# Patient Record
Sex: Female | Born: 1945
Health system: Southern US, Community
[De-identification: ages and names within clinical notes are randomized; demographics above are authoritative.]

## PROBLEM LIST (undated history)

## (undated) DIAGNOSIS — I10 Essential (primary) hypertension: Secondary | ICD-10-CM

## (undated) DIAGNOSIS — K219 Gastro-esophageal reflux disease without esophagitis: Secondary | ICD-10-CM

## (undated) DIAGNOSIS — G709 Myoneural disorder, unspecified: Secondary | ICD-10-CM

## (undated) DIAGNOSIS — R51 Headache: Secondary | ICD-10-CM

## (undated) DIAGNOSIS — F32A Depression, unspecified: Secondary | ICD-10-CM

## (undated) DIAGNOSIS — M810 Age-related osteoporosis without current pathological fracture: Secondary | ICD-10-CM

## (undated) DIAGNOSIS — J189 Pneumonia, unspecified organism: Secondary | ICD-10-CM

## (undated) DIAGNOSIS — F419 Anxiety disorder, unspecified: Secondary | ICD-10-CM

## (undated) DIAGNOSIS — R519 Headache, unspecified: Secondary | ICD-10-CM

## (undated) DIAGNOSIS — G20 Parkinson's disease: Secondary | ICD-10-CM

## (undated) DIAGNOSIS — C4491 Basal cell carcinoma of skin, unspecified: Secondary | ICD-10-CM

## (undated) DIAGNOSIS — E7800 Pure hypercholesterolemia, unspecified: Secondary | ICD-10-CM

## (undated) DIAGNOSIS — R251 Tremor, unspecified: Secondary | ICD-10-CM

## (undated) DIAGNOSIS — M1990 Unspecified osteoarthritis, unspecified site: Secondary | ICD-10-CM

## (undated) DIAGNOSIS — E785 Hyperlipidemia, unspecified: Secondary | ICD-10-CM

## (undated) DIAGNOSIS — G20A1 Parkinson's disease without dyskinesia, without mention of fluctuations: Secondary | ICD-10-CM

## (undated) DIAGNOSIS — M8580 Other specified disorders of bone density and structure, unspecified site: Secondary | ICD-10-CM

## (undated) DIAGNOSIS — K5190 Ulcerative colitis, unspecified, without complications: Secondary | ICD-10-CM

## (undated) HISTORY — DX: Headache: R51

## (undated) HISTORY — DX: Gastro-esophageal reflux disease without esophagitis: K21.9

## (undated) HISTORY — DX: Basal cell carcinoma of skin, unspecified: C44.91

## (undated) HISTORY — DX: Headache, unspecified: R51.9

## (undated) HISTORY — DX: Hyperlipidemia, unspecified: E78.5

## (undated) HISTORY — DX: Parkinson's disease: G20

## (undated) HISTORY — PX: CARPAL TUNNEL RELEASE: SHX101

## (undated) HISTORY — PX: TUBAL LIGATION: SHX77

## (undated) HISTORY — DX: Ulcerative colitis, unspecified, without complications: K51.90

## (undated) HISTORY — DX: Unspecified osteoarthritis, unspecified site: M19.90

## (undated) HISTORY — DX: Age-related osteoporosis without current pathological fracture: M81.0

## (undated) HISTORY — DX: Other specified disorders of bone density and structure, unspecified site: M85.80

## (undated) HISTORY — DX: Parkinson's disease without dyskinesia, without mention of fluctuations: G20.A1

---

## 2002-07-05 ENCOUNTER — Ambulatory Visit (HOSPITAL_COMMUNITY): Admission: RE | Admit: 2002-07-05 | Discharge: 2002-07-05 | Payer: Self-pay | Admitting: Orthopedic Surgery

## 2002-07-05 ENCOUNTER — Encounter: Payer: Self-pay | Admitting: Orthopedic Surgery

## 2002-09-01 ENCOUNTER — Encounter: Payer: Self-pay | Admitting: Family Medicine

## 2002-09-01 ENCOUNTER — Encounter: Admission: RE | Admit: 2002-09-01 | Discharge: 2002-09-01 | Payer: Self-pay | Admitting: Family Medicine

## 2003-08-18 ENCOUNTER — Other Ambulatory Visit: Admission: RE | Admit: 2003-08-18 | Discharge: 2003-08-18 | Payer: Self-pay | Admitting: Family Medicine

## 2003-09-08 ENCOUNTER — Ambulatory Visit (HOSPITAL_BASED_OUTPATIENT_CLINIC_OR_DEPARTMENT_OTHER): Admission: RE | Admit: 2003-09-08 | Discharge: 2003-09-08 | Payer: Self-pay | Admitting: Orthopedic Surgery

## 2003-09-08 ENCOUNTER — Ambulatory Visit (HOSPITAL_COMMUNITY): Admission: RE | Admit: 2003-09-08 | Discharge: 2003-09-08 | Payer: Self-pay | Admitting: Orthopedic Surgery

## 2003-09-08 ENCOUNTER — Encounter (INDEPENDENT_AMBULATORY_CARE_PROVIDER_SITE_OTHER): Payer: Self-pay | Admitting: *Deleted

## 2004-01-19 ENCOUNTER — Encounter: Admission: RE | Admit: 2004-01-19 | Discharge: 2004-01-19 | Payer: Self-pay | Admitting: Family Medicine

## 2005-05-02 ENCOUNTER — Other Ambulatory Visit: Admission: RE | Admit: 2005-05-02 | Discharge: 2005-05-02 | Payer: Self-pay | Admitting: Family Medicine

## 2005-06-20 ENCOUNTER — Encounter: Admission: RE | Admit: 2005-06-20 | Discharge: 2005-06-20 | Payer: Self-pay | Admitting: Family Medicine

## 2007-05-21 ENCOUNTER — Ambulatory Visit (HOSPITAL_COMMUNITY): Admission: RE | Admit: 2007-05-21 | Discharge: 2007-05-21 | Payer: Self-pay | Admitting: Interventional Cardiology

## 2008-04-30 ENCOUNTER — Encounter: Admission: RE | Admit: 2008-04-30 | Discharge: 2008-04-30 | Payer: Self-pay | Admitting: Orthopedic Surgery

## 2009-01-05 ENCOUNTER — Other Ambulatory Visit: Admission: RE | Admit: 2009-01-05 | Discharge: 2009-01-05 | Payer: Self-pay | Admitting: Family Medicine

## 2010-05-18 IMAGING — MR MR [PERSON_NAME] LOW JT W/O CM*R*
4 of 5 series · 14 of 40 positions shown · non-contrast
Comparison: None available

CLINICAL DATA: Severe right knee pain and swelling.

MRI RIGHT KNEE WITHOUT CONTRAST
TECHNIQUE: Multiplanar, multisequence MR imaging of the right knee
was performed.  No intravenous contrast was administered.

[Series 3: PD fat-sat · axial · 3.5mm · 0.28mm/px · z∈[-59,+17]mm · 5 of 24 slices shown (1 of 2)]
[im 1/24]
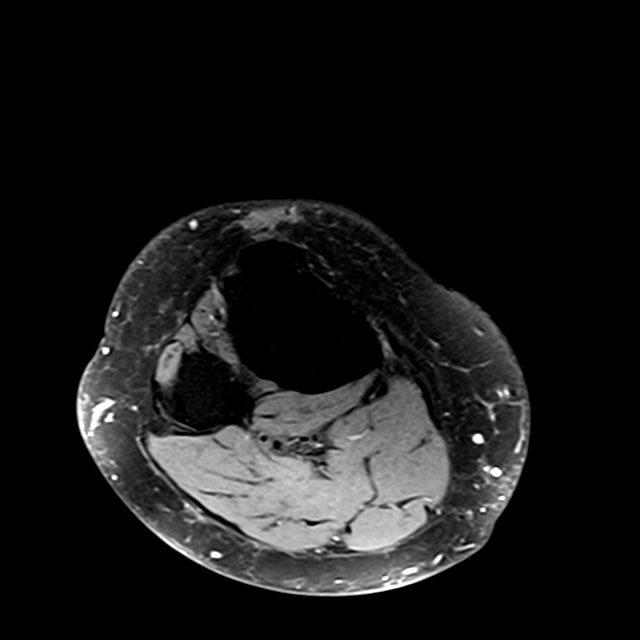
[im 3/24]
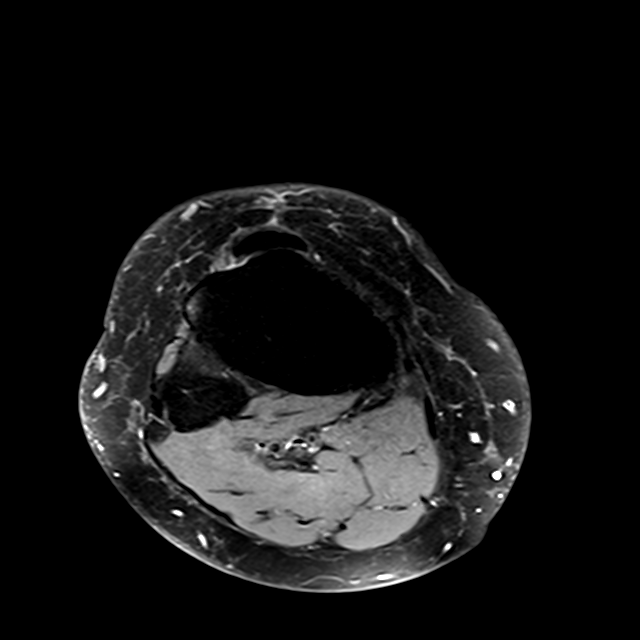
[im 6/24]
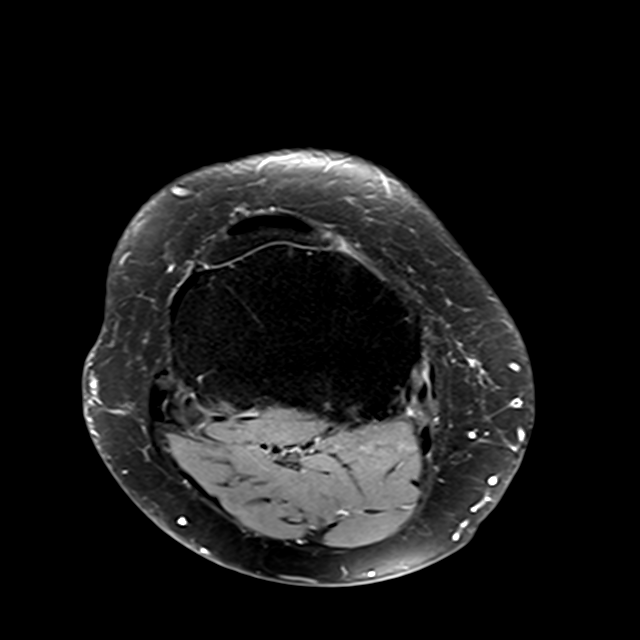
[im 12/24]
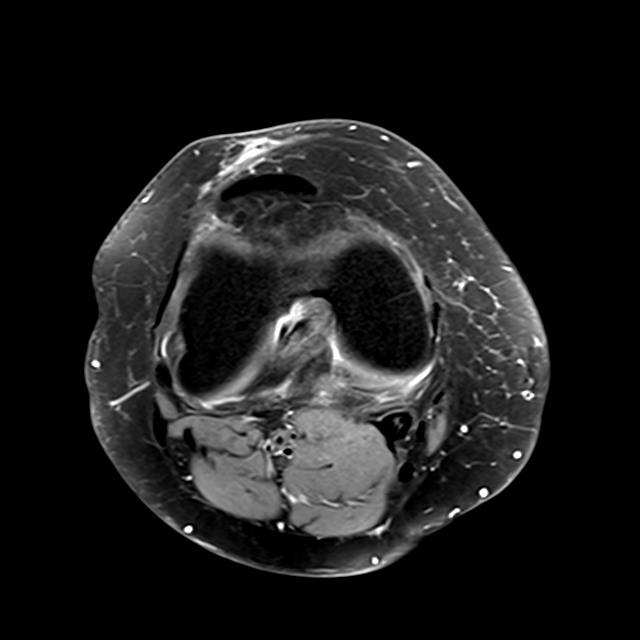
[im 21/24]
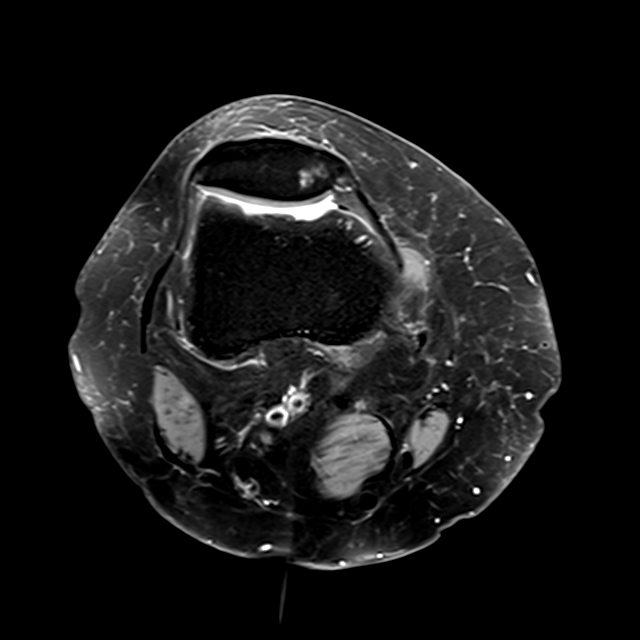

[Series 4: PD fat-sat · coronal · 3.3mm · 0.28mm/px · 3 of 24 slices shown (2 of 2)]
[im 4/24]
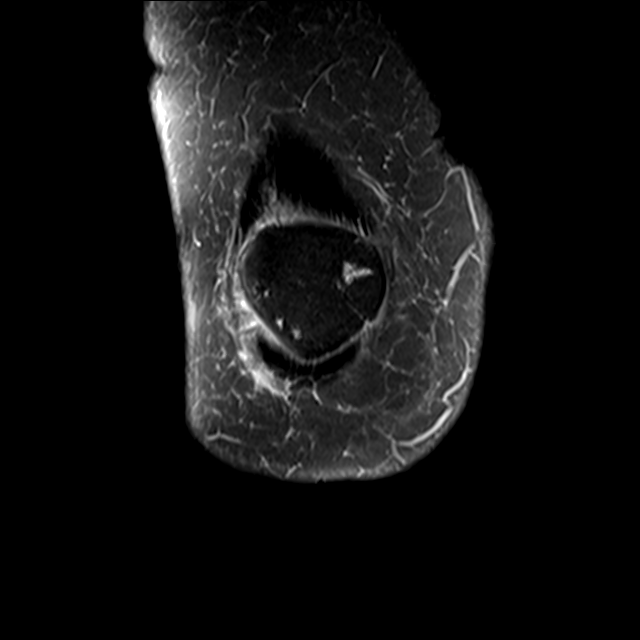
[im 14/24]
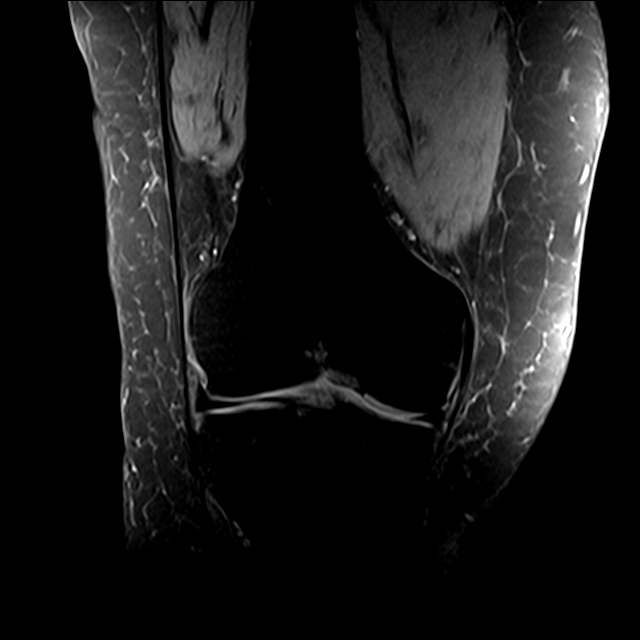
[im 20/24]
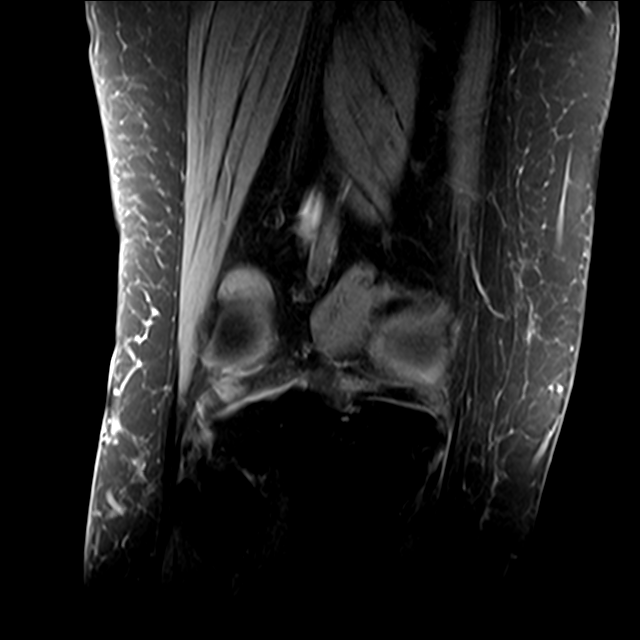

[Series 5: T2 fat-sat · coronal · 3.3mm · 0.35mm/px · 3 of 24 slices shown]
[im 4/24]
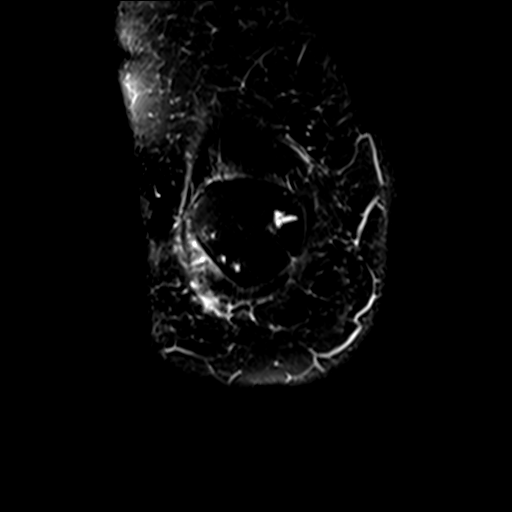
[im 14/24]
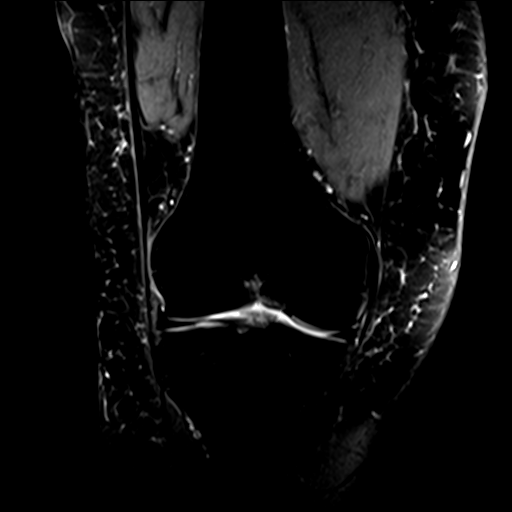
[im 20/24]
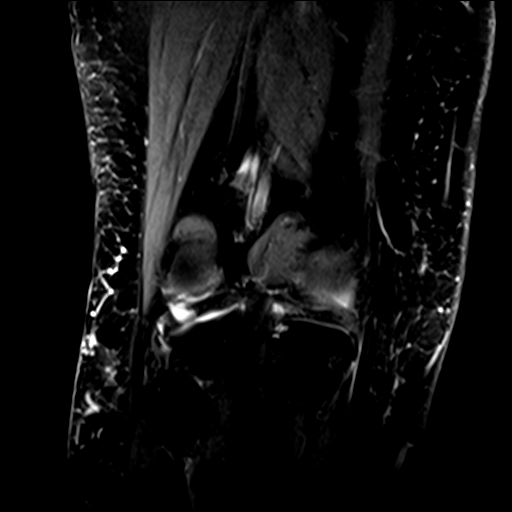

[Series 6: T1 · coronal · 3.3mm · 0.28mm/px · 3 of 24 slices shown]
[im 4/24]
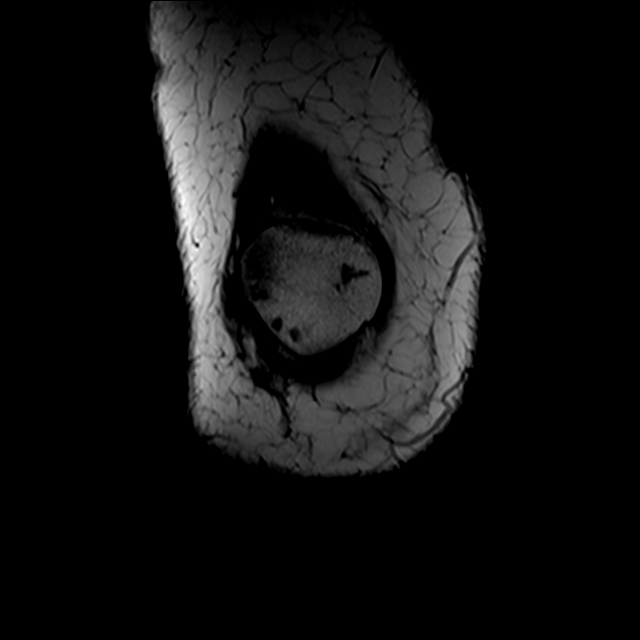
[im 14/24]
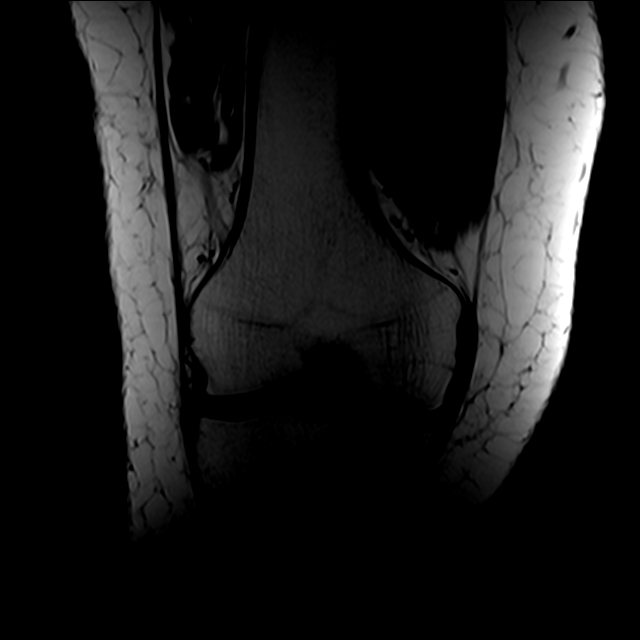
[im 20/24]
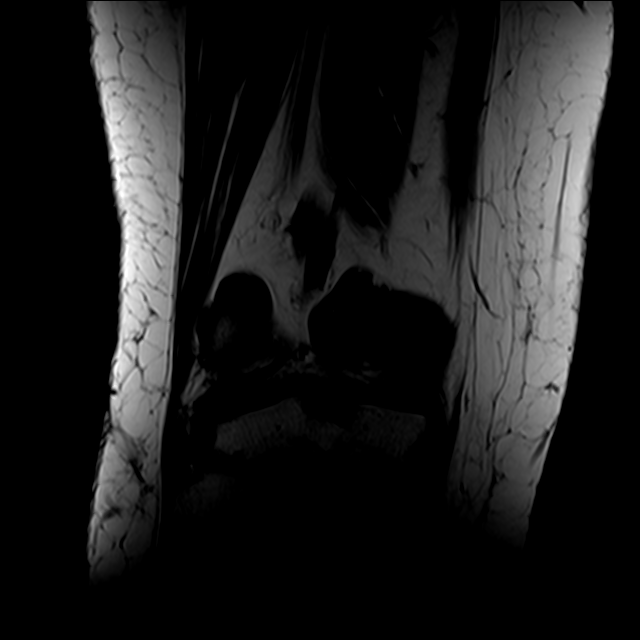

[14 of 40 positions shown; findings below may reference images not displayed]

FINDINGS: There is a longitudinal tear of the body of the medial
meniscus.  The tear is in the periphery of the meniscus and no
displaced fragment is identified.  The lateral meniscus is intact.
The anterior and posterior cruciate ligaments, medial and lateral
collateral ligament complexes and extensor mechanism of the knee
are all intact.

There is tricompartmental degenerative change about the knee which
appears worst of patellofemoral compartment where there is
scattered subchondral cyst formation, worst along the medial
patellar facet.  Scant amount of joint fluid is noted.  There is no
Baker's cyst.
IMPRESSION: 1.  Longitudinal tear of the body the medial meniscus without a
displaced fragment.
2.  Degenerative change of the knee worst in the patellofemoral
compartment.

## 2010-09-02 ENCOUNTER — Encounter: Payer: Self-pay | Admitting: Orthopedic Surgery

## 2010-12-27 NOTE — Op Note (Signed)
NAME:  Ashley Neal, HARTNEY NO.:  [id number]   MEDICAL RECORD NO.:  [id number]                   PATIENT TYPE:  AMB   LOCATION:  [place]                                  FACILITY:  [facility]   PHYSICIAN:  Daryll Brod, M.D.                    DATE OF BIRTH:  [birth year]   DATE OF PROCEDURE:  09/08/2003  DATE OF DISCHARGE:                                 OPERATIVE REPORT   PREOPERATIVE DIAGNOSIS:  Pyogenic granuloma, right thumb.   POSTOPERATIVE DIAGNOSIS:  Pyogenic granuloma, right thumb.   OPERATION:  Excision pyogenic granuloma, right thumb.   SURGEON:  Daryll Brod, M.D.   ASSISTANTDimas Millin   ANESTHESIA:  Metacarpal block.   HISTORY:  The patient is a 65 year old female with a history of a small cut  on the margin of her right thumb nail fold that has developed pyogenic  granuloma which has been removed once, has been cauterized once, only to  recur.   DESCRIPTION OF PROCEDURE:  The patient was brought to the operating room  where a metacarpal block with 1% Xylocaine without epinephrine was used.  She is prepped using DuraPrep in the supine position with the right arm  free.  After adequate anesthesia, a Penrose drain was used for tourniquet  control at the base of the thumb.  The pyogenic granuloma was then removed  elliptically.  The bed was cauterized.  The wound was then closed with  interrupted 5-0 chromic sutures, several being placed through the nail to  allow complete closure of the skin.  A compressive dressing was applied.  The patient tolerated the procedure well.  On removal of the Penrose drain,  the thumb immediately pinked.  She is discharged home to return to the [facility] in one week on Vicodin and Keflex.                                               Daryll Brod, M.D.    Aretta Nip  D:  09/08/2003  T:  09/08/2003  Job:  [id number]

## 2011-11-02 DIAGNOSIS — M79609 Pain in unspecified limb: Secondary | ICD-10-CM | POA: Diagnosis not present

## 2011-11-11 DIAGNOSIS — Z23 Encounter for immunization: Secondary | ICD-10-CM | POA: Diagnosis not present

## 2011-11-11 DIAGNOSIS — M810 Age-related osteoporosis without current pathological fracture: Secondary | ICD-10-CM | POA: Diagnosis not present

## 2011-11-11 DIAGNOSIS — E785 Hyperlipidemia, unspecified: Secondary | ICD-10-CM | POA: Diagnosis not present

## 2011-11-11 DIAGNOSIS — Z79899 Other long term (current) drug therapy: Secondary | ICD-10-CM | POA: Diagnosis not present

## 2011-11-11 DIAGNOSIS — I10 Essential (primary) hypertension: Secondary | ICD-10-CM | POA: Diagnosis not present

## 2011-12-12 ENCOUNTER — Other Ambulatory Visit: Payer: Self-pay | Admitting: Dermatology

## 2011-12-12 DIAGNOSIS — L578 Other skin changes due to chronic exposure to nonionizing radiation: Secondary | ICD-10-CM | POA: Diagnosis not present

## 2011-12-12 DIAGNOSIS — L821 Other seborrheic keratosis: Secondary | ICD-10-CM | POA: Diagnosis not present

## 2011-12-12 DIAGNOSIS — D485 Neoplasm of uncertain behavior of skin: Secondary | ICD-10-CM | POA: Diagnosis not present

## 2012-10-01 ENCOUNTER — Other Ambulatory Visit (HOSPITAL_COMMUNITY)
Admission: RE | Admit: 2012-10-01 | Discharge: 2012-10-01 | Disposition: A | Discharge status: Home / Self Care | Payer: Medicare Other | Source: Ambulatory Visit | Attending: Family Medicine | Admitting: Family Medicine

## 2012-10-01 ENCOUNTER — Other Ambulatory Visit: Payer: Self-pay | Admitting: Family Medicine

## 2012-10-01 DIAGNOSIS — Z124 Encounter for screening for malignant neoplasm of cervix: Secondary | ICD-10-CM | POA: Diagnosis not present

## 2012-10-01 DIAGNOSIS — Z1331 Encounter for screening for depression: Secondary | ICD-10-CM | POA: Diagnosis not present

## 2012-10-22 ENCOUNTER — Other Ambulatory Visit: Payer: Self-pay

## 2012-11-18 ENCOUNTER — Ambulatory Visit
Admission: RE | Admit: 2012-11-18 | Discharge: 2012-11-18 | Disposition: A | Discharge status: Home / Self Care | Payer: Medicare Other | Source: Ambulatory Visit

## 2012-11-18 DIAGNOSIS — Z1231 Encounter for screening mammogram for malignant neoplasm of breast: Secondary | ICD-10-CM

## 2012-12-10 ENCOUNTER — Other Ambulatory Visit: Payer: Self-pay | Admitting: Dermatology

## 2012-12-10 DIAGNOSIS — L820 Inflamed seborrheic keratosis: Secondary | ICD-10-CM | POA: Diagnosis not present

## 2012-12-10 DIAGNOSIS — L821 Other seborrheic keratosis: Secondary | ICD-10-CM | POA: Diagnosis not present

## 2012-12-10 DIAGNOSIS — L570 Actinic keratosis: Secondary | ICD-10-CM | POA: Diagnosis not present

## 2012-12-10 DIAGNOSIS — L578 Other skin changes due to chronic exposure to nonionizing radiation: Secondary | ICD-10-CM | POA: Diagnosis not present

## 2012-12-10 DIAGNOSIS — L680 Hirsutism: Secondary | ICD-10-CM | POA: Diagnosis not present

## 2012-12-10 DIAGNOSIS — L819 Disorder of pigmentation, unspecified: Secondary | ICD-10-CM | POA: Diagnosis not present

## 2012-12-10 DIAGNOSIS — D485 Neoplasm of uncertain behavior of skin: Secondary | ICD-10-CM | POA: Diagnosis not present

## 2013-01-07 DIAGNOSIS — Z1211 Encounter for screening for malignant neoplasm of colon: Secondary | ICD-10-CM | POA: Diagnosis not present

## 2013-05-03 DIAGNOSIS — L02419 Cutaneous abscess of limb, unspecified: Secondary | ICD-10-CM | POA: Diagnosis not present

## 2013-05-05 DIAGNOSIS — I10 Essential (primary) hypertension: Secondary | ICD-10-CM | POA: Diagnosis not present

## 2013-05-05 DIAGNOSIS — Z79899 Other long term (current) drug therapy: Secondary | ICD-10-CM | POA: Diagnosis not present

## 2013-05-05 DIAGNOSIS — M810 Age-related osteoporosis without current pathological fracture: Secondary | ICD-10-CM | POA: Diagnosis not present

## 2013-05-05 DIAGNOSIS — E785 Hyperlipidemia, unspecified: Secondary | ICD-10-CM | POA: Diagnosis not present

## 2013-10-17 ENCOUNTER — Other Ambulatory Visit: Payer: Self-pay

## 2013-10-17 DIAGNOSIS — Z1231 Encounter for screening mammogram for malignant neoplasm of breast: Secondary | ICD-10-CM

## 2013-10-21 DIAGNOSIS — Z1331 Encounter for screening for depression: Secondary | ICD-10-CM | POA: Diagnosis not present

## 2013-10-21 DIAGNOSIS — E785 Hyperlipidemia, unspecified: Secondary | ICD-10-CM | POA: Diagnosis not present

## 2013-10-21 DIAGNOSIS — K219 Gastro-esophageal reflux disease without esophagitis: Secondary | ICD-10-CM | POA: Diagnosis not present

## 2013-10-21 DIAGNOSIS — M810 Age-related osteoporosis without current pathological fracture: Secondary | ICD-10-CM | POA: Diagnosis not present

## 2013-10-21 DIAGNOSIS — I10 Essential (primary) hypertension: Secondary | ICD-10-CM | POA: Diagnosis not present

## 2013-10-21 DIAGNOSIS — Z23 Encounter for immunization: Secondary | ICD-10-CM | POA: Diagnosis not present

## 2013-10-21 DIAGNOSIS — Z79899 Other long term (current) drug therapy: Secondary | ICD-10-CM | POA: Diagnosis not present

## 2013-10-21 DIAGNOSIS — M25519 Pain in unspecified shoulder: Secondary | ICD-10-CM | POA: Diagnosis not present

## 2013-10-21 DIAGNOSIS — Z0000 Encounter for general adult medical examination without abnormal findings: Secondary | ICD-10-CM | POA: Diagnosis not present

## 2013-10-28 DIAGNOSIS — M7500 Adhesive capsulitis of unspecified shoulder: Secondary | ICD-10-CM | POA: Diagnosis not present

## 2013-11-03 DIAGNOSIS — M25519 Pain in unspecified shoulder: Secondary | ICD-10-CM | POA: Diagnosis not present

## 2013-11-03 DIAGNOSIS — M7500 Adhesive capsulitis of unspecified shoulder: Secondary | ICD-10-CM | POA: Diagnosis not present

## 2013-11-07 DIAGNOSIS — M7500 Adhesive capsulitis of unspecified shoulder: Secondary | ICD-10-CM | POA: Diagnosis not present

## 2013-11-07 DIAGNOSIS — M25519 Pain in unspecified shoulder: Secondary | ICD-10-CM | POA: Diagnosis not present

## 2013-11-17 DIAGNOSIS — M25519 Pain in unspecified shoulder: Secondary | ICD-10-CM | POA: Diagnosis not present

## 2013-11-17 DIAGNOSIS — M7500 Adhesive capsulitis of unspecified shoulder: Secondary | ICD-10-CM | POA: Diagnosis not present

## 2013-11-24 DIAGNOSIS — M7500 Adhesive capsulitis of unspecified shoulder: Secondary | ICD-10-CM | POA: Diagnosis not present

## 2013-11-24 DIAGNOSIS — M25519 Pain in unspecified shoulder: Secondary | ICD-10-CM | POA: Diagnosis not present

## 2013-12-01 DIAGNOSIS — M7500 Adhesive capsulitis of unspecified shoulder: Secondary | ICD-10-CM | POA: Diagnosis not present

## 2013-12-01 DIAGNOSIS — M25519 Pain in unspecified shoulder: Secondary | ICD-10-CM | POA: Diagnosis not present

## 2013-12-02 ENCOUNTER — Ambulatory Visit
Admission: RE | Admit: 2013-12-02 | Discharge: 2013-12-02 | Disposition: A | Discharge status: Home / Self Care | Payer: Medicare Other | Source: Ambulatory Visit

## 2013-12-02 DIAGNOSIS — Z1231 Encounter for screening mammogram for malignant neoplasm of breast: Secondary | ICD-10-CM

## 2013-12-16 DIAGNOSIS — L821 Other seborrheic keratosis: Secondary | ICD-10-CM | POA: Diagnosis not present

## 2013-12-16 DIAGNOSIS — L739 Follicular disorder, unspecified: Secondary | ICD-10-CM | POA: Diagnosis not present

## 2013-12-16 DIAGNOSIS — L723 Sebaceous cyst: Secondary | ICD-10-CM | POA: Diagnosis not present

## 2013-12-16 DIAGNOSIS — L819 Disorder of pigmentation, unspecified: Secondary | ICD-10-CM | POA: Diagnosis not present

## 2013-12-16 DIAGNOSIS — D1801 Hemangioma of skin and subcutaneous tissue: Secondary | ICD-10-CM | POA: Diagnosis not present

## 2013-12-16 DIAGNOSIS — M7500 Adhesive capsulitis of unspecified shoulder: Secondary | ICD-10-CM | POA: Diagnosis not present

## 2013-12-16 DIAGNOSIS — M25519 Pain in unspecified shoulder: Secondary | ICD-10-CM | POA: Diagnosis not present

## 2013-12-16 DIAGNOSIS — L905 Scar conditions and fibrosis of skin: Secondary | ICD-10-CM | POA: Diagnosis not present

## 2013-12-16 DIAGNOSIS — D239 Other benign neoplasm of skin, unspecified: Secondary | ICD-10-CM | POA: Diagnosis not present

## 2014-05-05 DIAGNOSIS — Z79899 Other long term (current) drug therapy: Secondary | ICD-10-CM | POA: Diagnosis not present

## 2014-05-05 DIAGNOSIS — E7800 Pure hypercholesterolemia, unspecified: Secondary | ICD-10-CM | POA: Diagnosis not present

## 2014-05-05 DIAGNOSIS — M949 Disorder of cartilage, unspecified: Secondary | ICD-10-CM | POA: Diagnosis not present

## 2014-05-05 DIAGNOSIS — Z23 Encounter for immunization: Secondary | ICD-10-CM | POA: Diagnosis not present

## 2014-05-05 DIAGNOSIS — I10 Essential (primary) hypertension: Secondary | ICD-10-CM | POA: Diagnosis not present

## 2014-05-05 DIAGNOSIS — M899 Disorder of bone, unspecified: Secondary | ICD-10-CM | POA: Diagnosis not present

## 2014-09-08 DIAGNOSIS — L821 Other seborrheic keratosis: Secondary | ICD-10-CM | POA: Diagnosis not present

## 2014-09-08 DIAGNOSIS — L814 Other melanin hyperpigmentation: Secondary | ICD-10-CM | POA: Diagnosis not present

## 2014-09-08 DIAGNOSIS — D225 Melanocytic nevi of trunk: Secondary | ICD-10-CM | POA: Diagnosis not present

## 2014-09-08 DIAGNOSIS — L720 Epidermal cyst: Secondary | ICD-10-CM | POA: Diagnosis not present

## 2014-09-08 DIAGNOSIS — D2262 Melanocytic nevi of left upper limb, including shoulder: Secondary | ICD-10-CM | POA: Diagnosis not present

## 2014-09-18 ENCOUNTER — Encounter (HOSPITAL_COMMUNITY): Payer: Self-pay | Admitting: Family Medicine

## 2014-09-18 ENCOUNTER — Emergency Department (HOSPITAL_COMMUNITY): Payer: Medicare Other

## 2014-09-18 ENCOUNTER — Emergency Department (HOSPITAL_COMMUNITY)
Admission: EM | Admit: 2014-09-18 | Discharge: 2014-09-18 | Discharge status: Against Medical Advice | Payer: Medicare Other | Attending: Emergency Medicine | Admitting: Emergency Medicine

## 2014-09-18 ENCOUNTER — Telehealth: Payer: Self-pay | Admitting: Physician Assistant

## 2014-09-18 DIAGNOSIS — R109 Unspecified abdominal pain: Secondary | ICD-10-CM | POA: Diagnosis not present

## 2014-09-18 DIAGNOSIS — R079 Chest pain, unspecified: Secondary | ICD-10-CM | POA: Diagnosis not present

## 2014-09-18 DIAGNOSIS — R0789 Other chest pain: Secondary | ICD-10-CM | POA: Diagnosis not present

## 2014-09-18 DIAGNOSIS — R5383 Other fatigue: Secondary | ICD-10-CM | POA: Insufficient documentation

## 2014-09-18 DIAGNOSIS — I10 Essential (primary) hypertension: Secondary | ICD-10-CM | POA: Diagnosis not present

## 2014-09-18 DIAGNOSIS — M549 Dorsalgia, unspecified: Secondary | ICD-10-CM | POA: Diagnosis not present

## 2014-09-18 DIAGNOSIS — Z8639 Personal history of other endocrine, nutritional and metabolic disease: Secondary | ICD-10-CM | POA: Insufficient documentation

## 2014-09-18 HISTORY — DX: Essential (primary) hypertension: I10

## 2014-09-18 HISTORY — DX: Pure hypercholesterolemia, unspecified: E78.00

## 2014-09-18 LAB — BASIC METABOLIC PANEL
ANION GAP: 8 (ref 5–15)
BUN: 9 mg/dL (ref 6–23)
CHLORIDE: 106 mmol/L (ref 96–112)
CO2: 28 mmol/L (ref 19–32)
Calcium: 9.9 mg/dL (ref 8.4–10.5)
Creatinine, Ser: 0.87 mg/dL (ref 0.50–1.10)
GFR calc Af Amer: 78 mL/min — ABNORMAL LOW (ref >90)
GFR calc non Af Amer: 67 mL/min — ABNORMAL LOW (ref >90)
Glucose, Bld: 98 mg/dL (ref 70–99)
Potassium: 3.7 mmol/L (ref 3.5–5.1)
Sodium: 142 mmol/L (ref 135–145)

## 2014-09-18 LAB — CBC
HEMATOCRIT: 40.9 % (ref 36.0–46.0)
Hemoglobin: 14.1 g/dL (ref 12.0–15.0)
MCH: 30.6 pg (ref 26.0–34.0)
MCHC: 34.5 g/dL (ref 30.0–36.0)
MCV: 88.7 fL (ref 78.0–100.0)
Platelets: 227 10*3/uL (ref 150–400)
RBC: 4.61 MIL/uL (ref 3.87–5.11)
RDW: 13.2 % (ref 11.5–15.5)
WBC: 5.3 10*3/uL (ref 4.0–10.5)

## 2014-09-18 LAB — I-STAT TROPONIN, ED: Troponin i, poc: 0 ng/mL (ref 0.00–0.08)

## 2014-09-18 MED ORDER — ASPIRIN 81 MG PO CHEW
324.0000 mg | CHEWABLE_TABLET | Freq: Once | ORAL | Status: DC
  Filled 2014-09-18: qty 4

## 2014-09-18 NOTE — ED Provider Notes (Signed)
CSN: [id number]     Arrival date & time 09/18/14  1102 History   First MD Initiated Contact with Patient 09/18/14 1549     Chief Complaint  Patient presents with  . Back Pain  . Chest Pain     (Consider location/radiation/quality/duration/timing/severity/associated sxs/prior Treatment) HPI Comments: Patient presents to ED with onset of upper back "tightness of radiated to her chest, left arm around 8:30 this morning. Symptoms were dull and achy but became sharp and stabbing lasting for a few minutes at a time. Never had this pain before. Pain was worse with palpation and worse with movement of her arm. Worse with position change. She denies any shortness of breath, nausea or vomiting. She says she became sweaty and anxious because she thought she was having a heart attack. Denies any cardiac history. She reportedly clean catheterization about 5 years ago by Dr. Irish Lack she reports history of high blood pressure, high cholesterol. She does not smoke. She is feeling back to normal now.  Patient is a 68 y.o. female presenting with chest pain. The history is provided by the patient.  Chest Pain Associated symptoms: abdominal pain, back pain and fatigue   Associated symptoms: no cough, no dizziness, no fever, no nausea, no shortness of breath, not vomiting and no weakness     Past Medical History  Diagnosis Date  . High cholesterol   . Hypertension    History reviewed. No pertinent past surgical history. History reviewed. No pertinent family history. History  Substance Use Topics  . Smoking status: Never Smoker   . Smokeless tobacco: Not on file  . Alcohol Use: No   OB History    No data available     Review of Systems  Constitutional: Positive for fatigue. Negative for fever, activity change and appetite change.  HENT: Negative for congestion and rhinorrhea.   Respiratory: Positive for chest tightness. Negative for cough and shortness of breath.   Cardiovascular: Positive for  chest pain.  Gastrointestinal: Positive for abdominal pain. Negative for nausea and vomiting.  Genitourinary: Negative for dysuria, hematuria, vaginal bleeding and vaginal discharge.  Musculoskeletal: Positive for back pain.  Skin: Negative for rash.  Neurological: Negative for dizziness, weakness and light-headedness.  A complete 10 system review of systems was obtained and all systems are negative except as noted in the HPI and PMH.      Allergies  Codeine and Tape  Home Medications   Prior to Admission medications   Not on File   BP 137/76 mmHg  Pulse 73  Temp(Src) 98 F (36.7 C)  Resp 18  Ht 5\' 1"  (1.549 m)  SpO2 95% Physical Exam  Constitutional: She is oriented to person, place, and time. She appears well-developed and well-nourished. No distress.  HENT:  Head: Normocephalic and atraumatic.  Mouth/Throat: Oropharynx is clear and moist. No oropharyngeal exudate.  Eyes: Conjunctivae and EOM are normal. Pupils are equal, round, and reactive to light.  Neck: Normal range of motion. Neck supple.  No meningismus.  Cardiovascular: Normal rate, regular rhythm, normal heart sounds and intact distal pulses.   No murmur heard. Pulmonary/Chest: Effort normal and breath sounds normal. No respiratory distress. She exhibits no tenderness.  Abdominal: Soft. There is no tenderness. There is no rebound and no guarding.  Musculoskeletal: Normal range of motion. She exhibits no edema or tenderness.  Neurological: She is alert and oriented to person, place, and time. No cranial nerve deficit. She exhibits normal muscle tone. Coordination normal.  No ataxia on  finger to nose bilaterally. No pronator drift. 5/5 strength throughout. CN 2-12 intact. Negative Romberg. Equal grip strength. Sensation intact. Gait is normal.   Skin: Skin is warm.  Psychiatric: She has a normal mood and affect. Her behavior is normal.  Nursing note and vitals reviewed.   ED Course  Procedures (including  critical care time) Labs Review Labs Reviewed  BASIC METABOLIC PANEL - Abnormal; Notable for the following:    GFR calc non Af Amer 67 (*)    GFR calc Af Amer 78 (*)    All other components within normal limits  CBC  I-STAT TROPOININ, ED    Imaging Review Dg Chest 2 View  09/18/2014   CLINICAL DATA:  Upper back pain radiating to the chest. Acute chest pain.  EXAM: CHEST  2 VIEW  COMPARISON:  None.  FINDINGS: The heart size and mediastinal contours are within normal limits. Both lungs are clear. The visualized skeletal structures are unremarkable. Minor thoracic degenerative changes.  IMPRESSION: No active cardiopulmonary disease.   Electronically Signed   By: Daryll Brod M.D.   On: 09/18/2014 12:57     EKG Interpretation   Date/Time:  Monday September 18 2014 11:29:59 EST Ventricular Rate:  78 PR Interval:  146 QRS Duration: 76 QT Interval:  372 QTC Calculation: 424 R Axis:   48 Text Interpretation:  Normal sinus rhythm with sinus arrhythmia  Nonspecific ST and T wave abnormality Abnormal ECG No previous ECGs  available Confirmed by Wyvonnia Dusky  MD, Marni Franzoni [phone]) on 09/18/2014 3:49:30 PM      MDM   Final diagnoses:  Chest pain   Episode of back pain and chest pain that is dull and achy that became sharp and stabbing over a few minutes. It is worse with movement and position change.  ST depression inferior and laterally, no comparison.  Triage troponin negative.  Second troponin and d-dimer ordered. Plan to discuss with cardiology given patient's abnormal EKG. Patient eloped from the ED before I could speak with her. Nursing staff was unable to speak with her as well. Patient left the ED without informing staff.  Ezequiel Essex, MD 09/19/14 [phone]

## 2014-09-18 NOTE — Progress Notes (Signed)
Came to see patient in consultation but room was empty. I was informed by nursing staff that patient left AMA. Bakari Nikolai PA-C

## 2014-09-18 NOTE — ED Notes (Signed)
Per pt she was at work this am and had sudden onset of upper back pain radiating into her chest and now radiation to left arm. sts hurts to breathe deep. denies SOB,.

## 2014-09-18 NOTE — Telephone Encounter (Signed)
Since patient left ER AMA without being seen, I have left a message on our office's scheduling voicemail requesting a new patient appointment ASAP, and our office will call the patient with this information. Donovyn Guidice PA-C

## 2014-09-18 NOTE — ED Notes (Signed)
Pt stated she was tired of waiting and was leaving.  RN asked pt to let her get her primary care giver to talk with her.  RN went into room and patient had left AMA.

## 2014-09-22 ENCOUNTER — Encounter: Payer: Medicare Other | Admitting: Cardiology

## 2014-09-22 DIAGNOSIS — R9431 Abnormal electrocardiogram [ECG] [EKG]: Secondary | ICD-10-CM | POA: Diagnosis not present

## 2014-09-22 DIAGNOSIS — M549 Dorsalgia, unspecified: Secondary | ICD-10-CM | POA: Diagnosis not present

## 2014-10-27 DIAGNOSIS — Z0000 Encounter for general adult medical examination without abnormal findings: Secondary | ICD-10-CM | POA: Diagnosis not present

## 2014-10-27 DIAGNOSIS — K219 Gastro-esophageal reflux disease without esophagitis: Secondary | ICD-10-CM | POA: Diagnosis not present

## 2014-10-27 DIAGNOSIS — Z1389 Encounter for screening for other disorder: Secondary | ICD-10-CM | POA: Diagnosis not present

## 2014-10-27 DIAGNOSIS — E780 Pure hypercholesterolemia: Secondary | ICD-10-CM | POA: Diagnosis not present

## 2014-10-27 DIAGNOSIS — M810 Age-related osteoporosis without current pathological fracture: Secondary | ICD-10-CM | POA: Diagnosis not present

## 2014-10-27 DIAGNOSIS — I10 Essential (primary) hypertension: Secondary | ICD-10-CM | POA: Diagnosis not present

## 2014-10-27 DIAGNOSIS — K056 Periodontal disease, unspecified: Secondary | ICD-10-CM | POA: Diagnosis not present

## 2014-10-27 DIAGNOSIS — Z79899 Other long term (current) drug therapy: Secondary | ICD-10-CM | POA: Diagnosis not present

## 2014-10-27 DIAGNOSIS — F419 Anxiety disorder, unspecified: Secondary | ICD-10-CM | POA: Diagnosis not present

## 2014-11-06 DIAGNOSIS — F411 Generalized anxiety disorder: Secondary | ICD-10-CM | POA: Diagnosis not present

## 2014-11-17 ENCOUNTER — Other Ambulatory Visit: Payer: Self-pay

## 2014-11-17 DIAGNOSIS — Z1231 Encounter for screening mammogram for malignant neoplasm of breast: Secondary | ICD-10-CM

## 2014-12-15 DIAGNOSIS — F411 Generalized anxiety disorder: Secondary | ICD-10-CM | POA: Diagnosis not present

## 2014-12-29 ENCOUNTER — Ambulatory Visit
Admission: RE | Admit: 2014-12-29 | Discharge: 2014-12-29 | Disposition: A | Discharge status: Home / Self Care | Payer: Medicare Other | Source: Ambulatory Visit

## 2014-12-29 DIAGNOSIS — Z1231 Encounter for screening mammogram for malignant neoplasm of breast: Secondary | ICD-10-CM

## 2015-01-26 DIAGNOSIS — F411 Generalized anxiety disorder: Secondary | ICD-10-CM | POA: Diagnosis not present

## 2015-03-23 DIAGNOSIS — L820 Inflamed seborrheic keratosis: Secondary | ICD-10-CM | POA: Diagnosis not present

## 2015-03-23 DIAGNOSIS — L720 Epidermal cyst: Secondary | ICD-10-CM | POA: Diagnosis not present

## 2015-03-23 DIAGNOSIS — D485 Neoplasm of uncertain behavior of skin: Secondary | ICD-10-CM | POA: Diagnosis not present

## 2015-03-23 DIAGNOSIS — L821 Other seborrheic keratosis: Secondary | ICD-10-CM | POA: Diagnosis not present

## 2015-04-27 DIAGNOSIS — Z23 Encounter for immunization: Secondary | ICD-10-CM | POA: Diagnosis not present

## 2015-05-04 DIAGNOSIS — I10 Essential (primary) hypertension: Secondary | ICD-10-CM | POA: Diagnosis not present

## 2015-05-04 DIAGNOSIS — E780 Pure hypercholesterolemia: Secondary | ICD-10-CM | POA: Diagnosis not present

## 2015-05-04 DIAGNOSIS — Z79899 Other long term (current) drug therapy: Secondary | ICD-10-CM | POA: Diagnosis not present

## 2015-08-12 HISTORY — PX: SQUAMOUS CELL CARCINOMA EXCISION: SHX2433

## 2015-09-21 DIAGNOSIS — L821 Other seborrheic keratosis: Secondary | ICD-10-CM | POA: Diagnosis not present

## 2015-09-21 DIAGNOSIS — L720 Epidermal cyst: Secondary | ICD-10-CM | POA: Diagnosis not present

## 2015-09-21 DIAGNOSIS — D225 Melanocytic nevi of trunk: Secondary | ICD-10-CM | POA: Diagnosis not present

## 2015-09-21 DIAGNOSIS — L603 Nail dystrophy: Secondary | ICD-10-CM | POA: Diagnosis not present

## 2015-09-21 DIAGNOSIS — D2271 Melanocytic nevi of right lower limb, including hip: Secondary | ICD-10-CM | POA: Diagnosis not present

## 2015-09-21 DIAGNOSIS — L814 Other melanin hyperpigmentation: Secondary | ICD-10-CM | POA: Diagnosis not present

## 2015-11-02 DIAGNOSIS — K219 Gastro-esophageal reflux disease without esophagitis: Secondary | ICD-10-CM | POA: Diagnosis not present

## 2015-11-02 DIAGNOSIS — M810 Age-related osteoporosis without current pathological fracture: Secondary | ICD-10-CM | POA: Diagnosis not present

## 2015-11-02 DIAGNOSIS — I10 Essential (primary) hypertension: Secondary | ICD-10-CM | POA: Diagnosis not present

## 2015-11-02 DIAGNOSIS — L659 Nonscarring hair loss, unspecified: Secondary | ICD-10-CM | POA: Diagnosis not present

## 2015-11-02 DIAGNOSIS — Z1389 Encounter for screening for other disorder: Secondary | ICD-10-CM | POA: Diagnosis not present

## 2015-11-02 DIAGNOSIS — E7800 Pure hypercholesterolemia, unspecified: Secondary | ICD-10-CM | POA: Diagnosis not present

## 2015-11-02 DIAGNOSIS — Z0000 Encounter for general adult medical examination without abnormal findings: Secondary | ICD-10-CM | POA: Diagnosis not present

## 2015-11-02 DIAGNOSIS — F419 Anxiety disorder, unspecified: Secondary | ICD-10-CM | POA: Diagnosis not present

## 2015-11-02 DIAGNOSIS — Z79899 Other long term (current) drug therapy: Secondary | ICD-10-CM | POA: Diagnosis not present

## 2015-12-17 ENCOUNTER — Other Ambulatory Visit: Payer: Self-pay

## 2015-12-17 DIAGNOSIS — Z1231 Encounter for screening mammogram for malignant neoplasm of breast: Secondary | ICD-10-CM

## 2016-01-25 ENCOUNTER — Ambulatory Visit
Admission: RE | Admit: 2016-01-25 | Discharge: 2016-01-25 | Disposition: A | Discharge status: Home / Self Care | Payer: Medicare Other | Source: Ambulatory Visit

## 2016-01-25 DIAGNOSIS — Z1231 Encounter for screening mammogram for malignant neoplasm of breast: Secondary | ICD-10-CM

## 2016-03-14 DIAGNOSIS — M5489 Other dorsalgia: Secondary | ICD-10-CM | POA: Diagnosis not present

## 2016-04-10 DIAGNOSIS — M546 Pain in thoracic spine: Secondary | ICD-10-CM | POA: Diagnosis not present

## 2016-04-10 DIAGNOSIS — Z23 Encounter for immunization: Secondary | ICD-10-CM | POA: Diagnosis not present

## 2016-04-18 DIAGNOSIS — M62830 Muscle spasm of back: Secondary | ICD-10-CM | POA: Diagnosis not present

## 2016-04-18 DIAGNOSIS — M546 Pain in thoracic spine: Secondary | ICD-10-CM | POA: Diagnosis not present

## 2016-04-18 DIAGNOSIS — I10 Essential (primary) hypertension: Secondary | ICD-10-CM | POA: Diagnosis not present

## 2016-04-18 DIAGNOSIS — M2560 Stiffness of unspecified joint, not elsewhere classified: Secondary | ICD-10-CM | POA: Diagnosis not present

## 2016-04-21 DIAGNOSIS — M546 Pain in thoracic spine: Secondary | ICD-10-CM | POA: Diagnosis not present

## 2016-04-21 DIAGNOSIS — M2560 Stiffness of unspecified joint, not elsewhere classified: Secondary | ICD-10-CM | POA: Diagnosis not present

## 2016-04-21 DIAGNOSIS — M62830 Muscle spasm of back: Secondary | ICD-10-CM | POA: Diagnosis not present

## 2016-04-21 DIAGNOSIS — I10 Essential (primary) hypertension: Secondary | ICD-10-CM | POA: Diagnosis not present

## 2016-04-29 DIAGNOSIS — M546 Pain in thoracic spine: Secondary | ICD-10-CM | POA: Diagnosis not present

## 2016-04-29 DIAGNOSIS — I10 Essential (primary) hypertension: Secondary | ICD-10-CM | POA: Diagnosis not present

## 2016-04-29 DIAGNOSIS — M2560 Stiffness of unspecified joint, not elsewhere classified: Secondary | ICD-10-CM | POA: Diagnosis not present

## 2016-04-29 DIAGNOSIS — M62830 Muscle spasm of back: Secondary | ICD-10-CM | POA: Diagnosis not present

## 2016-05-02 DIAGNOSIS — M62830 Muscle spasm of back: Secondary | ICD-10-CM | POA: Diagnosis not present

## 2016-05-02 DIAGNOSIS — M546 Pain in thoracic spine: Secondary | ICD-10-CM | POA: Diagnosis not present

## 2016-05-02 DIAGNOSIS — I10 Essential (primary) hypertension: Secondary | ICD-10-CM | POA: Diagnosis not present

## 2016-05-02 DIAGNOSIS — M2560 Stiffness of unspecified joint, not elsewhere classified: Secondary | ICD-10-CM | POA: Diagnosis not present

## 2016-05-06 DIAGNOSIS — M62830 Muscle spasm of back: Secondary | ICD-10-CM | POA: Diagnosis not present

## 2016-05-06 DIAGNOSIS — I10 Essential (primary) hypertension: Secondary | ICD-10-CM | POA: Diagnosis not present

## 2016-05-06 DIAGNOSIS — M2560 Stiffness of unspecified joint, not elsewhere classified: Secondary | ICD-10-CM | POA: Diagnosis not present

## 2016-05-06 DIAGNOSIS — M546 Pain in thoracic spine: Secondary | ICD-10-CM | POA: Diagnosis not present

## 2016-05-09 DIAGNOSIS — M2560 Stiffness of unspecified joint, not elsewhere classified: Secondary | ICD-10-CM | POA: Diagnosis not present

## 2016-05-09 DIAGNOSIS — Z79899 Other long term (current) drug therapy: Secondary | ICD-10-CM | POA: Diagnosis not present

## 2016-05-09 DIAGNOSIS — F419 Anxiety disorder, unspecified: Secondary | ICD-10-CM | POA: Diagnosis not present

## 2016-05-09 DIAGNOSIS — F339 Major depressive disorder, recurrent, unspecified: Secondary | ICD-10-CM | POA: Diagnosis not present

## 2016-05-09 DIAGNOSIS — M549 Dorsalgia, unspecified: Secondary | ICD-10-CM | POA: Diagnosis not present

## 2016-05-09 DIAGNOSIS — E7800 Pure hypercholesterolemia, unspecified: Secondary | ICD-10-CM | POA: Diagnosis not present

## 2016-05-09 DIAGNOSIS — M810 Age-related osteoporosis without current pathological fracture: Secondary | ICD-10-CM | POA: Diagnosis not present

## 2016-05-09 DIAGNOSIS — M546 Pain in thoracic spine: Secondary | ICD-10-CM | POA: Diagnosis not present

## 2016-05-09 DIAGNOSIS — I10 Essential (primary) hypertension: Secondary | ICD-10-CM | POA: Diagnosis not present

## 2016-05-09 DIAGNOSIS — M62830 Muscle spasm of back: Secondary | ICD-10-CM | POA: Diagnosis not present

## 2016-05-13 DIAGNOSIS — M2560 Stiffness of unspecified joint, not elsewhere classified: Secondary | ICD-10-CM | POA: Diagnosis not present

## 2016-05-13 DIAGNOSIS — I10 Essential (primary) hypertension: Secondary | ICD-10-CM | POA: Diagnosis not present

## 2016-05-13 DIAGNOSIS — M62830 Muscle spasm of back: Secondary | ICD-10-CM | POA: Diagnosis not present

## 2016-05-13 DIAGNOSIS — M546 Pain in thoracic spine: Secondary | ICD-10-CM | POA: Diagnosis not present

## 2016-05-16 DIAGNOSIS — M62830 Muscle spasm of back: Secondary | ICD-10-CM | POA: Diagnosis not present

## 2016-05-16 DIAGNOSIS — M546 Pain in thoracic spine: Secondary | ICD-10-CM | POA: Diagnosis not present

## 2016-05-16 DIAGNOSIS — M2560 Stiffness of unspecified joint, not elsewhere classified: Secondary | ICD-10-CM | POA: Diagnosis not present

## 2016-05-16 DIAGNOSIS — I10 Essential (primary) hypertension: Secondary | ICD-10-CM | POA: Diagnosis not present

## 2016-05-20 DIAGNOSIS — M62830 Muscle spasm of back: Secondary | ICD-10-CM | POA: Diagnosis not present

## 2016-05-20 DIAGNOSIS — I10 Essential (primary) hypertension: Secondary | ICD-10-CM | POA: Diagnosis not present

## 2016-05-20 DIAGNOSIS — M546 Pain in thoracic spine: Secondary | ICD-10-CM | POA: Diagnosis not present

## 2016-05-20 DIAGNOSIS — M2560 Stiffness of unspecified joint, not elsewhere classified: Secondary | ICD-10-CM | POA: Diagnosis not present

## 2016-05-22 DIAGNOSIS — I10 Essential (primary) hypertension: Secondary | ICD-10-CM | POA: Diagnosis not present

## 2016-05-22 DIAGNOSIS — M2560 Stiffness of unspecified joint, not elsewhere classified: Secondary | ICD-10-CM | POA: Diagnosis not present

## 2016-05-22 DIAGNOSIS — M546 Pain in thoracic spine: Secondary | ICD-10-CM | POA: Diagnosis not present

## 2016-05-22 DIAGNOSIS — M62830 Muscle spasm of back: Secondary | ICD-10-CM | POA: Diagnosis not present

## 2016-05-26 DIAGNOSIS — I10 Essential (primary) hypertension: Secondary | ICD-10-CM | POA: Diagnosis not present

## 2016-05-26 DIAGNOSIS — M62830 Muscle spasm of back: Secondary | ICD-10-CM | POA: Diagnosis not present

## 2016-05-26 DIAGNOSIS — M546 Pain in thoracic spine: Secondary | ICD-10-CM | POA: Diagnosis not present

## 2016-05-26 DIAGNOSIS — M2560 Stiffness of unspecified joint, not elsewhere classified: Secondary | ICD-10-CM | POA: Diagnosis not present

## 2016-05-30 DIAGNOSIS — I10 Essential (primary) hypertension: Secondary | ICD-10-CM | POA: Diagnosis not present

## 2016-05-30 DIAGNOSIS — M546 Pain in thoracic spine: Secondary | ICD-10-CM | POA: Diagnosis not present

## 2016-05-30 DIAGNOSIS — M2560 Stiffness of unspecified joint, not elsewhere classified: Secondary | ICD-10-CM | POA: Diagnosis not present

## 2016-05-30 DIAGNOSIS — M62830 Muscle spasm of back: Secondary | ICD-10-CM | POA: Diagnosis not present

## 2016-07-11 DIAGNOSIS — H6980 Other specified disorders of Eustachian tube, unspecified ear: Secondary | ICD-10-CM | POA: Diagnosis not present

## 2016-10-05 IMAGING — DX DG CHEST 2V
2 series · 2 of 2 positions shown · non-contrast
Comparison: None.

CLINICAL DATA: Upper back pain radiating to the chest. Acute chest
pain.

EXAM:
CHEST  2 VIEW

[chest pa]
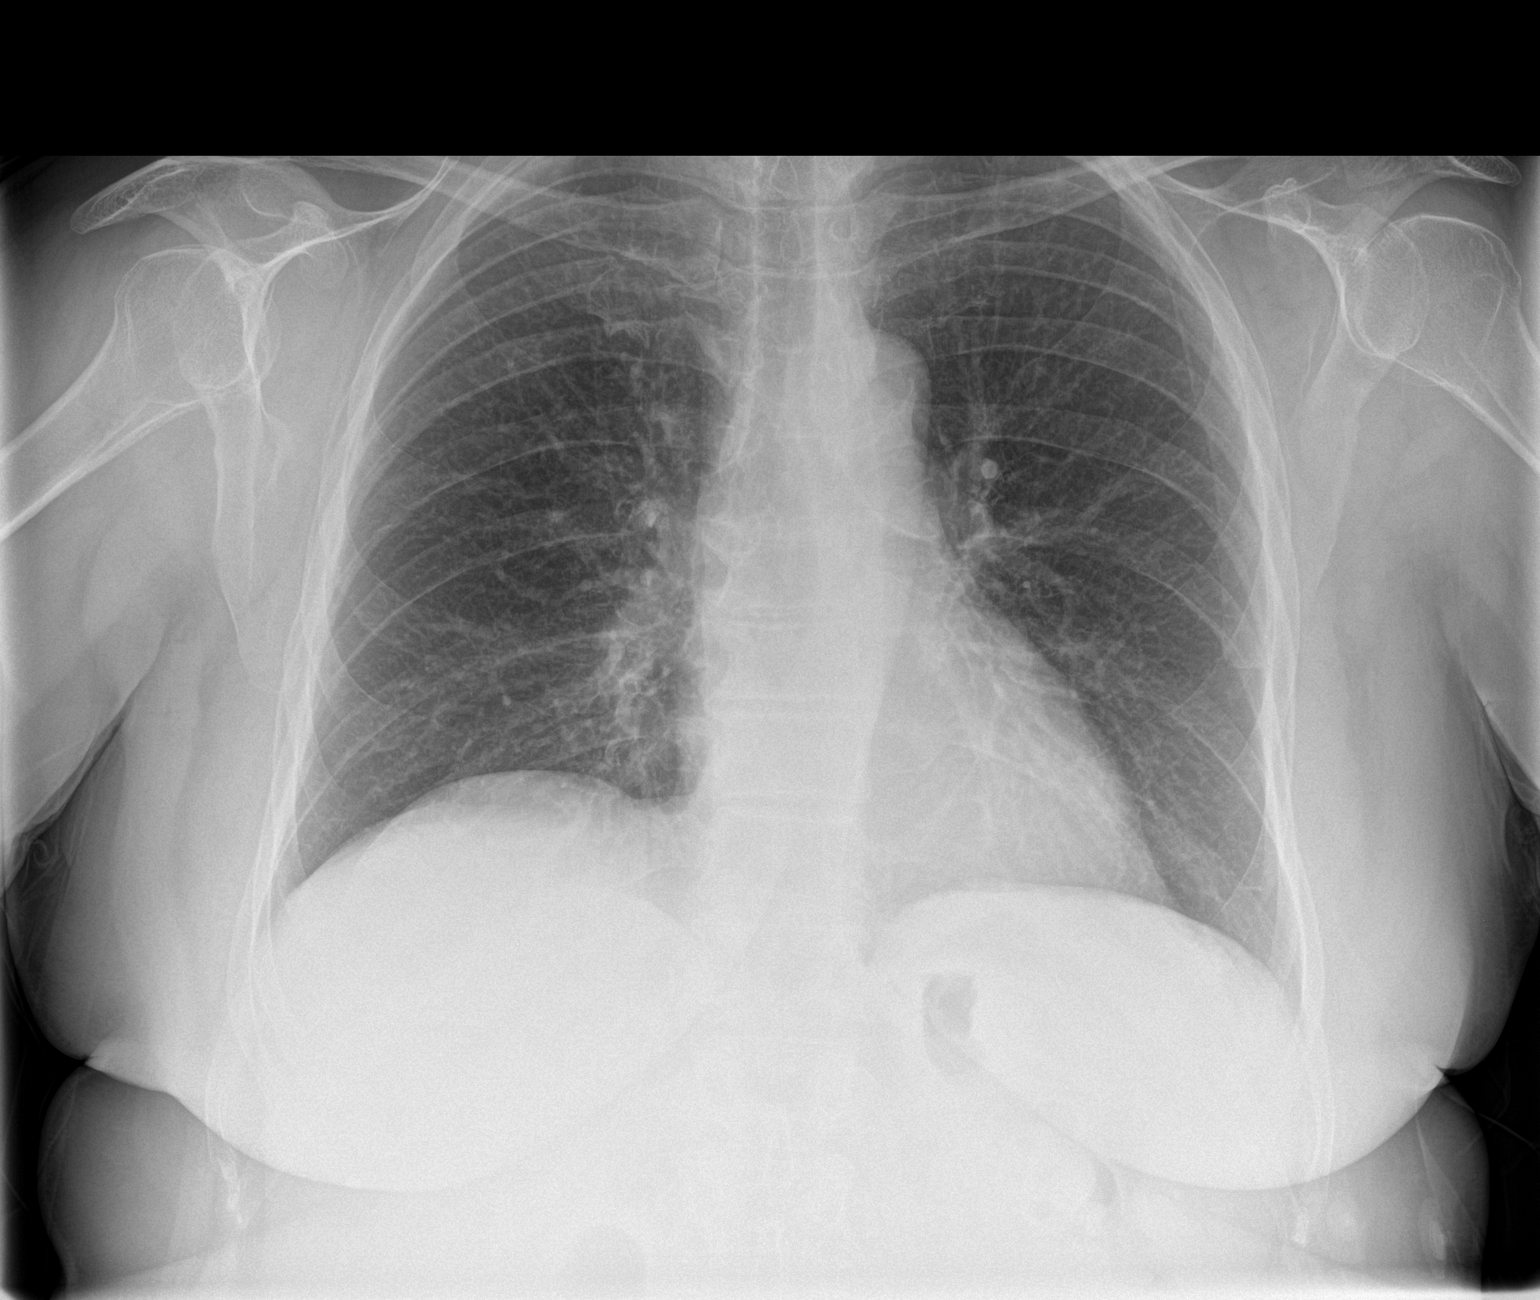

[chest lat]
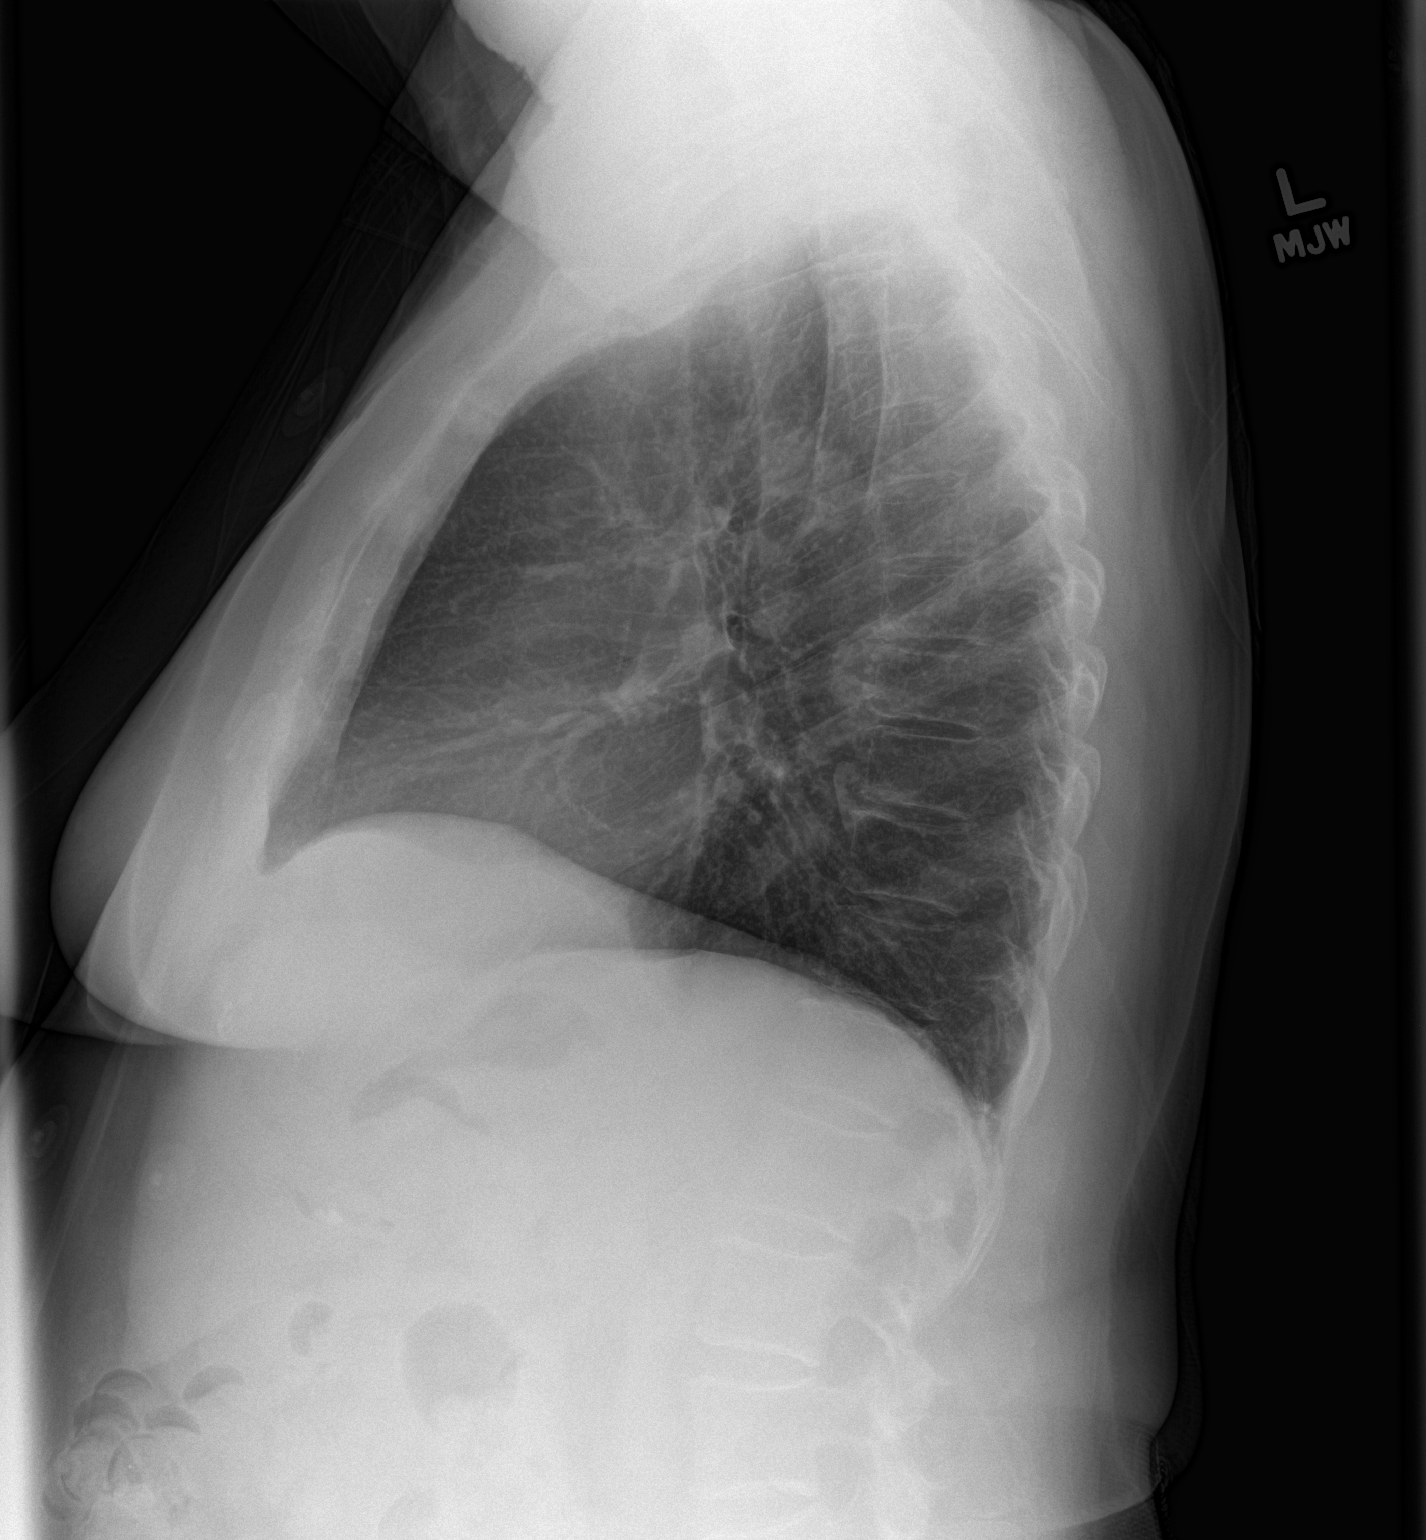

[2 of 2 positions shown; findings below may reference images not displayed]

FINDINGS: The heart size and mediastinal contours are within normal limits.
Both lungs are clear. The visualized skeletal structures are
unremarkable. Minor thoracic degenerative changes.
IMPRESSION: No active cardiopulmonary disease.

## 2016-11-14 DIAGNOSIS — Z1389 Encounter for screening for other disorder: Secondary | ICD-10-CM | POA: Diagnosis not present

## 2016-11-14 DIAGNOSIS — F419 Anxiety disorder, unspecified: Secondary | ICD-10-CM | POA: Diagnosis not present

## 2016-11-14 DIAGNOSIS — E559 Vitamin D deficiency, unspecified: Secondary | ICD-10-CM | POA: Diagnosis not present

## 2016-11-14 DIAGNOSIS — Z0000 Encounter for general adult medical examination without abnormal findings: Secondary | ICD-10-CM | POA: Diagnosis not present

## 2016-11-14 DIAGNOSIS — E7800 Pure hypercholesterolemia, unspecified: Secondary | ICD-10-CM | POA: Diagnosis not present

## 2016-11-14 DIAGNOSIS — K219 Gastro-esophageal reflux disease without esophagitis: Secondary | ICD-10-CM | POA: Diagnosis not present

## 2016-11-14 DIAGNOSIS — I10 Essential (primary) hypertension: Secondary | ICD-10-CM | POA: Diagnosis not present

## 2016-11-14 DIAGNOSIS — M810 Age-related osteoporosis without current pathological fracture: Secondary | ICD-10-CM | POA: Diagnosis not present

## 2016-12-09 DIAGNOSIS — M546 Pain in thoracic spine: Secondary | ICD-10-CM | POA: Diagnosis not present

## 2016-12-09 DIAGNOSIS — M791 Myalgia: Secondary | ICD-10-CM | POA: Diagnosis not present

## 2016-12-17 DIAGNOSIS — M546 Pain in thoracic spine: Secondary | ICD-10-CM | POA: Diagnosis not present

## 2016-12-17 DIAGNOSIS — S29012A Strain of muscle and tendon of back wall of thorax, initial encounter: Secondary | ICD-10-CM | POA: Diagnosis not present

## 2016-12-22 DIAGNOSIS — M546 Pain in thoracic spine: Secondary | ICD-10-CM | POA: Diagnosis not present

## 2016-12-22 DIAGNOSIS — M545 Low back pain: Secondary | ICD-10-CM | POA: Diagnosis not present

## 2016-12-24 DIAGNOSIS — M545 Low back pain: Secondary | ICD-10-CM | POA: Diagnosis not present

## 2016-12-24 DIAGNOSIS — M546 Pain in thoracic spine: Secondary | ICD-10-CM | POA: Diagnosis not present

## 2016-12-25 DIAGNOSIS — H43813 Vitreous degeneration, bilateral: Secondary | ICD-10-CM | POA: Diagnosis not present

## 2016-12-25 DIAGNOSIS — H2513 Age-related nuclear cataract, bilateral: Secondary | ICD-10-CM | POA: Diagnosis not present

## 2016-12-30 DIAGNOSIS — M546 Pain in thoracic spine: Secondary | ICD-10-CM | POA: Diagnosis not present

## 2016-12-30 DIAGNOSIS — M545 Low back pain: Secondary | ICD-10-CM | POA: Diagnosis not present

## 2017-01-01 DIAGNOSIS — M545 Low back pain: Secondary | ICD-10-CM | POA: Diagnosis not present

## 2017-01-01 DIAGNOSIS — M546 Pain in thoracic spine: Secondary | ICD-10-CM | POA: Diagnosis not present

## 2017-01-06 DIAGNOSIS — M546 Pain in thoracic spine: Secondary | ICD-10-CM | POA: Diagnosis not present

## 2017-01-06 DIAGNOSIS — M545 Low back pain: Secondary | ICD-10-CM | POA: Diagnosis not present

## 2017-01-09 DIAGNOSIS — M546 Pain in thoracic spine: Secondary | ICD-10-CM | POA: Diagnosis not present

## 2017-01-09 DIAGNOSIS — M545 Low back pain: Secondary | ICD-10-CM | POA: Diagnosis not present

## 2017-01-13 DIAGNOSIS — M545 Low back pain: Secondary | ICD-10-CM | POA: Diagnosis not present

## 2017-01-13 DIAGNOSIS — M546 Pain in thoracic spine: Secondary | ICD-10-CM | POA: Diagnosis not present

## 2017-01-15 DIAGNOSIS — M546 Pain in thoracic spine: Secondary | ICD-10-CM | POA: Diagnosis not present

## 2017-01-15 DIAGNOSIS — M545 Low back pain: Secondary | ICD-10-CM | POA: Diagnosis not present

## 2017-01-20 DIAGNOSIS — M545 Low back pain: Secondary | ICD-10-CM | POA: Diagnosis not present

## 2017-01-20 DIAGNOSIS — M546 Pain in thoracic spine: Secondary | ICD-10-CM | POA: Diagnosis not present

## 2017-01-22 DIAGNOSIS — M545 Low back pain: Secondary | ICD-10-CM | POA: Diagnosis not present

## 2017-01-22 DIAGNOSIS — M546 Pain in thoracic spine: Secondary | ICD-10-CM | POA: Diagnosis not present

## 2017-01-27 DIAGNOSIS — M546 Pain in thoracic spine: Secondary | ICD-10-CM | POA: Diagnosis not present

## 2017-01-27 DIAGNOSIS — M545 Low back pain: Secondary | ICD-10-CM | POA: Diagnosis not present

## 2017-01-28 DIAGNOSIS — M546 Pain in thoracic spine: Secondary | ICD-10-CM | POA: Diagnosis not present

## 2017-02-09 DIAGNOSIS — M545 Low back pain: Secondary | ICD-10-CM | POA: Diagnosis not present

## 2017-02-09 DIAGNOSIS — M546 Pain in thoracic spine: Secondary | ICD-10-CM | POA: Diagnosis not present

## 2017-02-12 DIAGNOSIS — M545 Low back pain: Secondary | ICD-10-CM | POA: Diagnosis not present

## 2017-02-12 DIAGNOSIS — M546 Pain in thoracic spine: Secondary | ICD-10-CM | POA: Diagnosis not present

## 2017-02-17 DIAGNOSIS — M545 Low back pain: Secondary | ICD-10-CM | POA: Diagnosis not present

## 2017-02-17 DIAGNOSIS — S80861A Insect bite (nonvenomous), right lower leg, initial encounter: Secondary | ICD-10-CM | POA: Diagnosis not present

## 2017-02-17 DIAGNOSIS — M546 Pain in thoracic spine: Secondary | ICD-10-CM | POA: Diagnosis not present

## 2017-02-20 DIAGNOSIS — M549 Dorsalgia, unspecified: Secondary | ICD-10-CM | POA: Diagnosis not present

## 2017-02-20 DIAGNOSIS — S80861D Insect bite (nonvenomous), right lower leg, subsequent encounter: Secondary | ICD-10-CM | POA: Diagnosis not present

## 2017-02-20 DIAGNOSIS — W57XXXD Bitten or stung by nonvenomous insect and other nonvenomous arthropods, subsequent encounter: Secondary | ICD-10-CM | POA: Diagnosis not present

## 2017-03-12 DIAGNOSIS — S46011A Strain of muscle(s) and tendon(s) of the rotator cuff of right shoulder, initial encounter: Secondary | ICD-10-CM | POA: Diagnosis not present

## 2017-03-12 DIAGNOSIS — M546 Pain in thoracic spine: Secondary | ICD-10-CM | POA: Diagnosis not present

## 2017-03-12 DIAGNOSIS — M47814 Spondylosis without myelopathy or radiculopathy, thoracic region: Secondary | ICD-10-CM | POA: Diagnosis not present

## 2017-04-02 DIAGNOSIS — E7800 Pure hypercholesterolemia, unspecified: Secondary | ICD-10-CM | POA: Diagnosis not present

## 2017-04-09 DIAGNOSIS — R5381 Other malaise: Secondary | ICD-10-CM | POA: Diagnosis not present

## 2017-04-09 DIAGNOSIS — F418 Other specified anxiety disorders: Secondary | ICD-10-CM | POA: Diagnosis not present

## 2017-04-09 DIAGNOSIS — R7301 Impaired fasting glucose: Secondary | ICD-10-CM | POA: Diagnosis not present

## 2017-04-09 DIAGNOSIS — R279 Unspecified lack of coordination: Secondary | ICD-10-CM | POA: Diagnosis not present

## 2017-04-09 DIAGNOSIS — R531 Weakness: Secondary | ICD-10-CM | POA: Diagnosis not present

## 2017-04-09 DIAGNOSIS — R251 Tremor, unspecified: Secondary | ICD-10-CM | POA: Diagnosis not present

## 2017-04-16 ENCOUNTER — Ambulatory Visit (INDEPENDENT_AMBULATORY_CARE_PROVIDER_SITE_OTHER): Payer: Medicare Other | Admitting: Neurology

## 2017-04-16 VITALS — BP 130/79 | HR 80 | Ht 61.0 in | Wt 158.0 lb

## 2017-04-16 DIAGNOSIS — R251 Tremor, unspecified: Secondary | ICD-10-CM

## 2017-04-16 DIAGNOSIS — R269 Unspecified abnormalities of gait and mobility: Secondary | ICD-10-CM | POA: Diagnosis not present

## 2017-04-16 DIAGNOSIS — R29818 Other symptoms and signs involving the nervous system: Secondary | ICD-10-CM

## 2017-04-16 DIAGNOSIS — R29898 Other symptoms and signs involving the musculoskeletal system: Secondary | ICD-10-CM

## 2017-04-16 NOTE — Patient Instructions (Addendum)
Your neurological exam shows just a few subtle changes, particularly on the right and I would like to monitor your symptoms and exam and see you back in about 6 months.   I do want to suggest a few things today:  Remember to drink plenty of fluid at least 6 glasses (8 oz each), eat healthy meals and do not skip any meals. Try to eat protein with a every meal and eat a healthy snack such as fruit or nuts in between meals. Try to keep a regular sleep-wake schedule and try to exercise daily, particularly in the form of walking, 20-30 minutes a day, if you can.   Try to stay active physically and mentally. Engage in social activities in your community and with your family and try to keep up with current events by reading the newspaper or watching the news. Try to do word puzzles and you may like to do puzzles and brain games on the computer such as on [email or web address]   As far as your medications are concerned, I would like hold off on any meds at this time.   As far as diagnostic testing, I would like to consider a brain MRI.    and call you with the test results. We will have to schedule you for this on a separate date. This test requires authorization from your insurance, and we will take care of the insurance process.  I would like to see you back in 6 months, sooner if we need to.   Our phone number is [phone]. We also have an after hours call service for urgent matters and there is a physician on-call for urgent questions, that cannot wait till the next work day. For any emergencies you know to call 911 or go to the nearest emergency room.   You can email me through my chart and also leave a phone message for Cyril Mourning, my nurse.

## 2017-04-16 NOTE — Progress Notes (Signed)
Subjective:    Patient ID: Ashley Neal is a 71 y.o. female.  HPI     Star Age, MD, PhD [facility] [address] [place],  [address]  Dear Dr. Drema Dallas,   I saw your patient, Fayrene Towner, upon your kind request in my neurologic clinic today for initial consultation of her tremor and other fine motor and gross motor dyscontrol issues. The patient is unaccompanied today. As you know, Ms. Guse is a 71 year old right-handed woman with an underlying medical history of hypertension, hyperlipidemia, history of colitis, osteoarthritis, osteoporosis, reflux disease, anxiety and obesity, who reports a several month history of a intermittent right hand tremor. She also reports difficulty with certain movements such as turning over in bed or certain movements when dancing. I reviewed your office note from 04/09/2017, which you kindly included. She reports recent weight loss without particularly trying to lose weight. Blood work through your office from 04/02/2017 was reviewed revealing normal CMP with a glucose level of 105, total cholesterol of 107, triglycerides 171, LDL 126. Recent A1c on 04/09/2017 was borderline at 5.8. CBC with differential was normal, B12 434. N TSH and N CK level on 04/09/17.  No FHx of PD or ET, father had dementia and lived to be 44, mom died at 76 from metastatic endom cancer. One brother with asthma, 2 sister, younger brother with EtOH abuse, youngest brother died at 42 yo in a car accident.  She has R shoulder problems, has had back pain in the mid back. Has had dexterity issue. She saw ortho at [facility], and has been to [facility] for PT.  She is doing exercises. 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  Has retired recently, 2 grown sons, one of them local, the other in [place].  She is a non-smoker, very infrequent.   Her Past Medical History Is Significant For: Past Medical History:  Diagnosis Date   . Frequent headaches   . GERD (gastroesophageal reflux disease)   . High cholesterol   . Hyperlipidemia   . Hypertension   . Osteoarthritis   . Osteoporosis   . Ulcerative colitis ([place])     Her Past Surgical History Is Significant For: No past surgical history on file.  Her Family History Is Significant For: No family history on file.  Her Social History Is Significant For: Social History   Social History  . Marital status: Divorced    Spouse name: N/A  . Number of children: N/A  . Years of education: N/A   Social History Main Topics  . Smoking status: Never Smoker  . Smokeless tobacco: Not on file  . Alcohol use No  . Drug use: Unknown  . Sexual activity: Not on file   Other Topics Concern  . Not on file   Social History Narrative  . No narrative on file    Her Allergies Are:  Allergies  Allergen Reactions  . Codeine Other (See Comments)    unknown  . Tape Other (See Comments)    unknown  :   Her Current Medications Are:  Outpatient Encounter Prescriptions as of 04/16/2017  Medication Sig  . ALPRAZolam (XANAX) 0.25 MG tablet Take 0.25 mg by mouth daily as needed for anxiety.  Marland Kitchen aspirin EC 81 MG tablet Take 81 mg by mouth daily.  Marland Kitchen CALCIUM PO Take 1,500 mg by mouth daily.  . Cholecalciferol (VITAMIN D) 2000 units CAPS Take 2,000 Units by mouth daily.  Marland Kitchen  lisinopril-hydrochlorothiazide (PRINZIDE,ZESTORETIC) 10-12.5 MG tablet Take 1 tablet by mouth daily.  . Multiple Vitamin (MULTIVITAMIN) tablet Take 1 tablet by mouth daily.  Marland Kitchen triamcinolone cream (KENALOG) 0.1 % Apply 1 application topically 2 (two) times daily.   No facility-administered encounter medications on file as of 04/16/2017.   : Review of Systems:  Out of a complete 14 point review of systems, all are reviewed and negative with the exception of these symptoms as listed below: Review of Systems  Neurological:       Pt states that she has a slight tremor in right hand that has been going on for a  few months. Pt states that it is intermittent and she hasn't happened this wk. Pt has been fatigue, no energy, no appetite. Pt had a massage a couple weeks and when the therapist told her to roll over she had difficulty knowing how to do this as well as when in an exercise class she had difficulty remembering how to do the twist.   pt had 13 spider bites and had a allergic reaction about 2 months ago.   Objective:  Neurological Exam  Physical Exam Physical Examination:   Vitals:   04/16/17 0817  BP: 130/79  Pulse: 80    General Examination: The patient is a very pleasant 71 y.o. female in no acute distress. She appears well-developed and well-nourished and well groomed.   HEENT: Normocephalic, atraumatic, pupils are equal, round and reactive to light and accommodation. Extraocular tracking is good without limitation to gaze excursion or nystagmus noted. Normal smooth pursuit is noted. Hearing is grossly intact. Face is symmetric with normal facial sensation and perhaps very subtle facial masking noted. Speech is clear, no dysarthria, no sialorrhea. She has no significant hypophonia. Neck is ever so slightly stiffer than expected. She has good range of motion. She has a benign airway exam.   Chest, heart, abdomen and extremities:   There  are no significant abnormalities noted, arthritic changes in both hands, left more than right, puffy ankles but not in the realm of pitting edema.   Skin: Warm and dry without trophic changes noted.  Musculoskeletal: Good range of motion, difficulty with hand movements secondary to arthritis and trigger finger-like changes.   Neurologically:  Mental status: The patient is awake, alert and oriented in all 4 spheres. Her immediate and remote memory, attention, language skills and fund of knowledge are appropriate. There is no evidence of aphasia, agnosia, apraxia or anomia. Speech is clear with normal prosody and enunciation. Thought process is linear. Mood  is normal and affect is normal.  Cranial nerves II - XII are as described above under HEENT exam. Left shoulder is ever so slightly higher than right.  Motor exam: Normal bulk, and strength is noted for age. She has perhaps a very slight increase in tone in the right upper extremity and right lower extremity with possible cogwheeling on the right upper extremity noted.   On 04/16/2017: On Archimedes spiral drawing there is no significant trembling noted. Her handwriting is not tremulous and legible, slightly micrographic.    Romberg is negative. Reflexes are 2+ throughout. Fine motor skills and coordination:She has very mild difficulty on the right with finger tap, hand movements, not so much with rapid alternating patting, left is normal. With foot taps she has very slight discrepancy, right side slightly slower and softer taps than left. She has no significant resting tremor. she has no postural or action tremor. Cerebellar testing: No dysmetria or intention tremor  on finger to nose testing. There is no truncal or gait ataxia.  Sensory exam: intact to light touch, pinprick, vibration, temperature sense in the upper and lower extremities.  Gait, station and balance: She stands easily. No veering to one side is noted. No leaning to one side is noted. Posture is age-appropriate and stance is narrow based. she walks with good pace, fairly good stride length, very subtle decrease in arm swing on the right. She turns well, balance is not impaired.   Assessment and Plan:   In summary, JULIETTA BATTERMAN is a very pleasant 71 y.o.-year old female with an underlying medical history of hypertension, hyperlipidemia, history of colitis, osteoarthritis, osteoporosis, reflux disease, anxiety and obesity, who Presents for neurologic consultation of her intermittent right hand tremor, fine motor dyscontrol, changes in her gait and posture reported. She also reports overall malaise and fatigue. 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 She moved from [place] 20 years ago after her divorce. She has a very active life at this also physically quite active with different exercise classes. On examination she has very subtle changes with fine motor dyscontrol on the right, changes in her muscle tone on the right, no significant tremor noted today. I would like to monitor her symptoms and neurological exam. We do have to keep in mind that she may be showing very subtle signs of parkinsonism affecting primarily her right side. I did talk to the patient at length today. I did not suggest any specific test her medication at this time and I think we need to observe her at this moment. She is very claustrophobic and would not be keen on an MRI. Perhaps we will consider a CT of her head at her next appointment. She was recently is advised to have a right shoulder MRI and could not go through with it. She would not be able to do the MRI with simple anxiolytic medication, may require anesthesia and I would not be keen on doing this at this time. I suggested a six-month recheck, in the meantime I would like for her to stay active mentally and physically and particularly engage in exercises and pursue healthy nutrition and good hydration. Constipation is not a problem, she has had no mood or cognitive complaints. These are all reassuring signs. I also advised her that we can see her sooner if the need arises. Answered all her questions today and she was in agreement with the plan.  Thank you very much for allowing me to participate in the care of this nice patient. If I can be of any further assistance to you please do not hesitate to call me at [phone].  Sincerely,   Star Age, MD, PhD

## 2017-05-15 ENCOUNTER — Encounter: Payer: Self-pay | Admitting: Neurology

## 2017-05-19 DIAGNOSIS — R251 Tremor, unspecified: Secondary | ICD-10-CM | POA: Diagnosis not present

## 2017-05-19 DIAGNOSIS — M67919 Unspecified disorder of synovium and tendon, unspecified shoulder: Secondary | ICD-10-CM | POA: Diagnosis not present

## 2017-05-19 DIAGNOSIS — Z23 Encounter for immunization: Secondary | ICD-10-CM | POA: Diagnosis not present

## 2017-05-19 DIAGNOSIS — F418 Other specified anxiety disorders: Secondary | ICD-10-CM | POA: Diagnosis not present

## 2017-05-21 ENCOUNTER — Other Ambulatory Visit: Payer: Self-pay | Admitting: Family Medicine

## 2017-05-21 DIAGNOSIS — M25512 Pain in left shoulder: Secondary | ICD-10-CM | POA: Diagnosis not present

## 2017-05-21 DIAGNOSIS — M25611 Stiffness of right shoulder, not elsewhere classified: Secondary | ICD-10-CM | POA: Diagnosis not present

## 2017-05-21 DIAGNOSIS — M25612 Stiffness of left shoulder, not elsewhere classified: Secondary | ICD-10-CM | POA: Diagnosis not present

## 2017-05-21 DIAGNOSIS — Z1231 Encounter for screening mammogram for malignant neoplasm of breast: Secondary | ICD-10-CM

## 2017-05-21 DIAGNOSIS — M25511 Pain in right shoulder: Secondary | ICD-10-CM | POA: Diagnosis not present

## 2017-05-25 DIAGNOSIS — M25511 Pain in right shoulder: Secondary | ICD-10-CM | POA: Diagnosis not present

## 2017-05-25 DIAGNOSIS — M25612 Stiffness of left shoulder, not elsewhere classified: Secondary | ICD-10-CM | POA: Diagnosis not present

## 2017-05-25 DIAGNOSIS — M25611 Stiffness of right shoulder, not elsewhere classified: Secondary | ICD-10-CM | POA: Diagnosis not present

## 2017-05-25 DIAGNOSIS — M25512 Pain in left shoulder: Secondary | ICD-10-CM | POA: Diagnosis not present

## 2017-05-26 DIAGNOSIS — M25512 Pain in left shoulder: Secondary | ICD-10-CM | POA: Diagnosis not present

## 2017-05-26 DIAGNOSIS — M25612 Stiffness of left shoulder, not elsewhere classified: Secondary | ICD-10-CM | POA: Diagnosis not present

## 2017-05-26 DIAGNOSIS — M25511 Pain in right shoulder: Secondary | ICD-10-CM | POA: Diagnosis not present

## 2017-05-26 DIAGNOSIS — M25611 Stiffness of right shoulder, not elsewhere classified: Secondary | ICD-10-CM | POA: Diagnosis not present

## 2017-05-28 DIAGNOSIS — M25511 Pain in right shoulder: Secondary | ICD-10-CM | POA: Diagnosis not present

## 2017-05-28 DIAGNOSIS — M25611 Stiffness of right shoulder, not elsewhere classified: Secondary | ICD-10-CM | POA: Diagnosis not present

## 2017-05-28 DIAGNOSIS — M25512 Pain in left shoulder: Secondary | ICD-10-CM | POA: Diagnosis not present

## 2017-05-28 DIAGNOSIS — M25612 Stiffness of left shoulder, not elsewhere classified: Secondary | ICD-10-CM | POA: Diagnosis not present

## 2017-06-01 DIAGNOSIS — M25512 Pain in left shoulder: Secondary | ICD-10-CM | POA: Diagnosis not present

## 2017-06-01 DIAGNOSIS — M25511 Pain in right shoulder: Secondary | ICD-10-CM | POA: Diagnosis not present

## 2017-06-01 DIAGNOSIS — M25612 Stiffness of left shoulder, not elsewhere classified: Secondary | ICD-10-CM | POA: Diagnosis not present

## 2017-06-01 DIAGNOSIS — M25611 Stiffness of right shoulder, not elsewhere classified: Secondary | ICD-10-CM | POA: Diagnosis not present

## 2017-06-02 DIAGNOSIS — M25512 Pain in left shoulder: Secondary | ICD-10-CM | POA: Diagnosis not present

## 2017-06-02 DIAGNOSIS — M25611 Stiffness of right shoulder, not elsewhere classified: Secondary | ICD-10-CM | POA: Diagnosis not present

## 2017-06-02 DIAGNOSIS — M25612 Stiffness of left shoulder, not elsewhere classified: Secondary | ICD-10-CM | POA: Diagnosis not present

## 2017-06-02 DIAGNOSIS — M25511 Pain in right shoulder: Secondary | ICD-10-CM | POA: Diagnosis not present

## 2017-06-04 ENCOUNTER — Ambulatory Visit: Payer: Medicare Other

## 2017-06-04 DIAGNOSIS — M25612 Stiffness of left shoulder, not elsewhere classified: Secondary | ICD-10-CM | POA: Diagnosis not present

## 2017-06-04 DIAGNOSIS — M25611 Stiffness of right shoulder, not elsewhere classified: Secondary | ICD-10-CM | POA: Diagnosis not present

## 2017-06-04 DIAGNOSIS — M25512 Pain in left shoulder: Secondary | ICD-10-CM | POA: Diagnosis not present

## 2017-06-04 DIAGNOSIS — M25511 Pain in right shoulder: Secondary | ICD-10-CM | POA: Diagnosis not present

## 2017-06-05 NOTE — Progress Notes (Signed)
Ashley Neal was seen today in the movement disorders clinic for neurologic consultation at the request of Leighton Ruff, MD.  The consultation is for the evaluation of tremor and gait change.  Pt states that it all started after a long bus trip that triggered back pain in may 2017.  Pt was just recently seen by Dr. Rexene Alberts for the same thing and I have reviewed her records.  Dr. Rexene Alberts felt that the patient showed subtle signs of mild parkinsonism and they decided to take a wait and see approach.  She states that she googled her sx's and just "knows" she has parkinsons.   Specific Symptoms:  Tremor: Yes.    RUE, only few seconds and rarely happens Family hx of similar:  Mothers sister had parkinsons Voice: no change Sleep: sleeps well, like a rock  Vivid Dreams:  No.  Acting out dreams:  Yes.  , screams and laughs Wet Pillows: No. Postural symptoms:  Yes.    Falls?  No. Bradykinesia symptoms: difficulty getting out of a chair and some difficulty picking up the feet but "I don't trip"; one foot seems to slam Loss of smell:  Yes.   - due to deviated septum Loss of taste:  No. Urinary Incontinence:  No., but "I feel tingling in my bladder when I pull off my shirt" Difficulty Swallowing:  No. Handwriting, micrographia: Yes.   Trouble with ADL's:  No.  Trouble buttoning clothing: Yes.   Depression:  Yes.   Memory changes:  Yes.   Hallucinations:  No.  visual distortions: No., but has a floater N/V:  No. Lightheaded:  No.  Syncope: No. Diplopia:  No. Dyskinesia:  No.  Neuroimaging of the brain has not previously been performed.  She is claustrophobic and "I have no plans to have imaging."  PREVIOUS MEDICATIONS: none to date  ALLERGIES:   Allergies  Allergen Reactions  . Codeine Other (See Comments)    unknown  . Tape Other (See Comments)    unknown    CURRENT MEDICATIONS:  Outpatient Encounter Prescriptions as of 06/09/2017  Medication Sig  . ALPRAZolam (XANAX)  0.25 MG tablet Take 0.25 mg by mouth daily as needed for anxiety.  Marland Kitchen aspirin EC 81 MG tablet Take 81 mg by mouth daily.  Marland Kitchen CALCIUM PO Take 1,500 mg by mouth daily.  . Cholecalciferol (VITAMIN D) 2000 units CAPS Take 2,000 Units by mouth daily.  Marland Kitchen lisinopril-hydrochlorothiazide (PRINZIDE,ZESTORETIC) 10-12.5 MG tablet Take 1 tablet by mouth daily.  . Multiple Vitamin (MULTIVITAMIN) tablet Take 1 tablet by mouth daily.  Marland Kitchen triamcinolone cream (KENALOG) 0.1 % Apply 1 application topically 2 (two) times daily.   No facility-administered encounter medications on file as of 06/09/2017.     PAST MEDICAL HISTORY:   Past Medical History:  Diagnosis Date  . Frequent headaches   . GERD (gastroesophageal reflux disease)   . High cholesterol   . Hyperlipidemia   . Hypertension   . Osteoarthritis   . Osteoporosis   . Ulcerative colitis ([place])     PAST SURGICAL HISTORY:  No past surgical history on file.  SOCIAL HISTORY:   Social History   Social History  . Marital status: Divorced    Spouse name: N/A  . Number of children: N/A  . Years of education: N/A   Occupational History  . Not on file.   Social History Main Topics  . Smoking status: Never Smoker  . Smokeless tobacco: Not on file  . Alcohol use  No  . Drug use: Unknown  . Sexual activity: Not on file   Other Topics Concern  . Not on file   Social History Narrative  . No narrative on file    FAMILY HISTORY:   No family status information on file.    ROS:  A complete 10 system review of systems was obtained and was unremarkable apart from what is mentioned above.  PHYSICAL EXAMINATION:    VITALS:  There were no vitals filed for this visit.  GEN:  The patient appears stated age and is in NAD. HEENT:  Normocephalic, atraumatic.  The mucous membranes are moist. The superficial temporal arteries are without ropiness or tenderness. CV:  RRR Lungs:  CTAB Neck/HEME:  There are no carotid bruits bilaterally.  Neurological  examination:  Orientation: The patient is alert and oriented x3. Fund of knowledge is appropriate.  Recent and remote memory are intact.  Attention and concentration are normal.    Able to name objects and repeat phrases. Cranial nerves: There is good facial symmetry. There is mild facial hypomimia.  Pupils are equal round and reactive to light bilaterally. Fundoscopic exam reveals clear margins bilaterally. Extraocular muscles are intact. The visual fields are full to confrontational testing. The speech is fluent and clear. Soft palate rises symmetrically and there is no tongue deviation. Hearing is intact to conversational tone. Sensation: Sensation is intact to light and pinprick throughout (facial, trunk, extremities). Vibration is intact at the bilateral big toe. There is no extinction with double simultaneous stimulation. There is no sensory dermatomal level identified. Motor: Strength is 5/5 in the bilateral upper and lower extremities.   Shoulder shrug is equal and symmetric.  There is no pronator drift. Deep tendon reflexes: Deep tendon reflexes are 2+/4 at the bilateral biceps, triceps, brachioradialis, patella and achilles. Plantar responses are downgoing bilaterally.  Movement examination: Tone: There is mild increased tone bilaterally only with activation procedures  Abnormal movements: none even with distraction procedures Coordination:  There is some decremation with RAM's, seen with all forms of RAM's with any form of RAMS, including alternating supination and pronation of the forearm, hand opening and closing, finger taps, heel taps and toe taps.  The R is slower than the right Gait and Station: The patient has no difficulty arising out of a deep-seated chair without the use of the hands. The patient's stride length is normal.  The patient has a negative pull test.      Dr. Guadelupe Sabin records indicate that the patient had blood work from her primary care physician on 04/02/2017 which  demonstrated normal CMP with a glucose level of 105, total cholesterol of 107, triglycerides 171, LDL 126. Recent A1c on 04/09/2017 was borderline at 5.8. CBC with differential was normal, B12 434. N TSH and N CK level on 04/09/17.   ASSESSMENT/PLAN:  1.  Parkinsonism.  I suspect that this does represent idiopathic Parkinson's disease but I agree with Dr. Rexene Alberts that this is very mild.    -We discussed the diagnosis as well as pathophysiology of the disease.  We discussed treatment options as well as prognostic indicators.  Patient education was provided.  -We discussed that it used to be thought that levodopa would increase risk of melanoma but now it is believed that Parkinsons itself likely increases risk of melanoma. she is to get regular skin checks.  -she refused medication  -we talked about importance of safe, CV exercise.   -We discussed community resources in the area including patient support  groups and community exercise programs for PD and pt education was provided to the patient.  -she met with our PD social worker today.  2.  Much greater than 50% of this visit was spent in counseling and coordinating care.  Total face to face time:  60 min   Cc:  Leighton Ruff, MD

## 2017-06-08 DIAGNOSIS — M25611 Stiffness of right shoulder, not elsewhere classified: Secondary | ICD-10-CM | POA: Diagnosis not present

## 2017-06-08 DIAGNOSIS — M25511 Pain in right shoulder: Secondary | ICD-10-CM | POA: Diagnosis not present

## 2017-06-08 DIAGNOSIS — M25512 Pain in left shoulder: Secondary | ICD-10-CM | POA: Diagnosis not present

## 2017-06-08 DIAGNOSIS — M25612 Stiffness of left shoulder, not elsewhere classified: Secondary | ICD-10-CM | POA: Diagnosis not present

## 2017-06-09 ENCOUNTER — Ambulatory Visit (INDEPENDENT_AMBULATORY_CARE_PROVIDER_SITE_OTHER): Payer: Medicare Other | Admitting: Neurology

## 2017-06-09 ENCOUNTER — Encounter: Payer: Self-pay | Admitting: Psychology

## 2017-06-09 ENCOUNTER — Encounter: Payer: Self-pay | Admitting: Neurology

## 2017-06-09 VITALS — BP 102/70 | HR 80 | Ht 61.0 in | Wt 158.0 lb

## 2017-06-09 DIAGNOSIS — G20 Parkinson's disease: Secondary | ICD-10-CM | POA: Diagnosis not present

## 2017-06-09 DIAGNOSIS — M25612 Stiffness of left shoulder, not elsewhere classified: Secondary | ICD-10-CM | POA: Diagnosis not present

## 2017-06-09 DIAGNOSIS — M25611 Stiffness of right shoulder, not elsewhere classified: Secondary | ICD-10-CM | POA: Diagnosis not present

## 2017-06-09 DIAGNOSIS — M25511 Pain in right shoulder: Secondary | ICD-10-CM | POA: Diagnosis not present

## 2017-06-09 DIAGNOSIS — M25512 Pain in left shoulder: Secondary | ICD-10-CM | POA: Diagnosis not present

## 2017-06-09 NOTE — Progress Notes (Signed)
I met with Ashley Neal today while she was in the clinic. I provided information and resources on resources for social support, exercise programs and educational information on Parkinson's disease. The patient has my card and contact information shall she have any needs.

## 2017-06-10 ENCOUNTER — Telehealth: Payer: Self-pay | Admitting: Neurology

## 2017-06-10 NOTE — Telephone Encounter (Signed)
Patient given the name Carbidopa Levodopa and she will make sure insurance covers this and may want to start. She will call back.

## 2017-06-10 NOTE — Telephone Encounter (Signed)
Pt called and said she was told about some medication but did not know the name so she could check with her insurance company to see if it is covered or not

## 2017-06-11 ENCOUNTER — Ambulatory Visit
Admission: RE | Admit: 2017-06-11 | Discharge: 2017-06-11 | Disposition: A | Discharge status: Home / Self Care | Payer: Medicare Other | Source: Ambulatory Visit | Attending: Family Medicine | Admitting: Family Medicine

## 2017-06-11 DIAGNOSIS — Z1231 Encounter for screening mammogram for malignant neoplasm of breast: Secondary | ICD-10-CM | POA: Diagnosis not present

## 2017-06-11 DIAGNOSIS — M25512 Pain in left shoulder: Secondary | ICD-10-CM | POA: Diagnosis not present

## 2017-06-11 DIAGNOSIS — M25612 Stiffness of left shoulder, not elsewhere classified: Secondary | ICD-10-CM | POA: Diagnosis not present

## 2017-06-11 DIAGNOSIS — M25511 Pain in right shoulder: Secondary | ICD-10-CM | POA: Diagnosis not present

## 2017-06-11 DIAGNOSIS — M25611 Stiffness of right shoulder, not elsewhere classified: Secondary | ICD-10-CM | POA: Diagnosis not present

## 2017-06-15 ENCOUNTER — Telehealth: Payer: Self-pay | Admitting: Neurology

## 2017-06-15 DIAGNOSIS — M25612 Stiffness of left shoulder, not elsewhere classified: Secondary | ICD-10-CM | POA: Diagnosis not present

## 2017-06-15 DIAGNOSIS — M25611 Stiffness of right shoulder, not elsewhere classified: Secondary | ICD-10-CM | POA: Diagnosis not present

## 2017-06-15 DIAGNOSIS — M25511 Pain in right shoulder: Secondary | ICD-10-CM | POA: Diagnosis not present

## 2017-06-15 DIAGNOSIS — M25512 Pain in left shoulder: Secondary | ICD-10-CM | POA: Diagnosis not present

## 2017-06-15 MED ORDER — CARBIDOPA-LEVODOPA 25-100 MG PO TABS: 1.0000 | ORAL_TABLET | Freq: Three times a day (TID) | ORAL | 2 refills | Status: DC

## 2017-06-15 NOTE — Telephone Encounter (Signed)
RX sent to pharmacy. Patient made aware of starting instructions. Aware to keep away from protein. Aware if develops nausea can take with carbs. She will call with any problems or questions.

## 2017-06-15 NOTE — Telephone Encounter (Signed)
Pt called and said she wants to start taking the carbadopa and would like a prescription called in

## 2017-06-15 NOTE — Telephone Encounter (Signed)
ok 

## 2017-06-15 NOTE — Telephone Encounter (Signed)
Patient was recently seen and not interested in medication. She has now decided that she would like to try it. Okay to send in medication and give instructions for titration of Carbidopa Levodopa?

## 2017-06-16 DIAGNOSIS — M25612 Stiffness of left shoulder, not elsewhere classified: Secondary | ICD-10-CM | POA: Diagnosis not present

## 2017-06-16 DIAGNOSIS — M25611 Stiffness of right shoulder, not elsewhere classified: Secondary | ICD-10-CM | POA: Diagnosis not present

## 2017-06-16 DIAGNOSIS — M25511 Pain in right shoulder: Secondary | ICD-10-CM | POA: Diagnosis not present

## 2017-06-16 DIAGNOSIS — M25512 Pain in left shoulder: Secondary | ICD-10-CM | POA: Diagnosis not present

## 2017-06-18 DIAGNOSIS — M25611 Stiffness of right shoulder, not elsewhere classified: Secondary | ICD-10-CM | POA: Diagnosis not present

## 2017-06-18 DIAGNOSIS — M25612 Stiffness of left shoulder, not elsewhere classified: Secondary | ICD-10-CM | POA: Diagnosis not present

## 2017-06-18 DIAGNOSIS — M25511 Pain in right shoulder: Secondary | ICD-10-CM | POA: Diagnosis not present

## 2017-06-18 DIAGNOSIS — M25512 Pain in left shoulder: Secondary | ICD-10-CM | POA: Diagnosis not present

## 2017-06-22 DIAGNOSIS — M25512 Pain in left shoulder: Secondary | ICD-10-CM | POA: Diagnosis not present

## 2017-06-22 DIAGNOSIS — M25511 Pain in right shoulder: Secondary | ICD-10-CM | POA: Diagnosis not present

## 2017-06-22 DIAGNOSIS — M25612 Stiffness of left shoulder, not elsewhere classified: Secondary | ICD-10-CM | POA: Diagnosis not present

## 2017-06-22 DIAGNOSIS — M25611 Stiffness of right shoulder, not elsewhere classified: Secondary | ICD-10-CM | POA: Diagnosis not present

## 2017-06-23 DIAGNOSIS — M25511 Pain in right shoulder: Secondary | ICD-10-CM | POA: Diagnosis not present

## 2017-06-23 DIAGNOSIS — M25612 Stiffness of left shoulder, not elsewhere classified: Secondary | ICD-10-CM | POA: Diagnosis not present

## 2017-06-23 DIAGNOSIS — M25611 Stiffness of right shoulder, not elsewhere classified: Secondary | ICD-10-CM | POA: Diagnosis not present

## 2017-06-23 DIAGNOSIS — M25512 Pain in left shoulder: Secondary | ICD-10-CM | POA: Diagnosis not present

## 2017-06-24 ENCOUNTER — Telehealth: Payer: Self-pay | Admitting: Neurology

## 2017-06-24 NOTE — Telephone Encounter (Signed)
Patient called and will be stopping by the office to pick up a [employer]. She wanted to know if it could be filled out for her? Thanks

## 2017-06-24 NOTE — Telephone Encounter (Signed)
Cycling form filled out and at the front for patient pick up.

## 2017-06-25 DIAGNOSIS — M25612 Stiffness of left shoulder, not elsewhere classified: Secondary | ICD-10-CM | POA: Diagnosis not present

## 2017-06-25 DIAGNOSIS — M25611 Stiffness of right shoulder, not elsewhere classified: Secondary | ICD-10-CM | POA: Diagnosis not present

## 2017-06-25 DIAGNOSIS — M25512 Pain in left shoulder: Secondary | ICD-10-CM | POA: Diagnosis not present

## 2017-06-25 DIAGNOSIS — M25511 Pain in right shoulder: Secondary | ICD-10-CM | POA: Diagnosis not present

## 2017-06-28 DIAGNOSIS — M25612 Stiffness of left shoulder, not elsewhere classified: Secondary | ICD-10-CM | POA: Diagnosis not present

## 2017-06-28 DIAGNOSIS — M25511 Pain in right shoulder: Secondary | ICD-10-CM | POA: Diagnosis not present

## 2017-06-28 DIAGNOSIS — M25512 Pain in left shoulder: Secondary | ICD-10-CM | POA: Diagnosis not present

## 2017-06-28 DIAGNOSIS — M25611 Stiffness of right shoulder, not elsewhere classified: Secondary | ICD-10-CM | POA: Diagnosis not present

## 2017-06-30 DIAGNOSIS — M25512 Pain in left shoulder: Secondary | ICD-10-CM | POA: Diagnosis not present

## 2017-06-30 DIAGNOSIS — M25612 Stiffness of left shoulder, not elsewhere classified: Secondary | ICD-10-CM | POA: Diagnosis not present

## 2017-06-30 DIAGNOSIS — M25611 Stiffness of right shoulder, not elsewhere classified: Secondary | ICD-10-CM | POA: Diagnosis not present

## 2017-06-30 DIAGNOSIS — M25511 Pain in right shoulder: Secondary | ICD-10-CM | POA: Diagnosis not present

## 2017-07-01 DIAGNOSIS — M25512 Pain in left shoulder: Secondary | ICD-10-CM | POA: Diagnosis not present

## 2017-07-01 DIAGNOSIS — M25611 Stiffness of right shoulder, not elsewhere classified: Secondary | ICD-10-CM | POA: Diagnosis not present

## 2017-07-01 DIAGNOSIS — M25511 Pain in right shoulder: Secondary | ICD-10-CM | POA: Diagnosis not present

## 2017-07-01 DIAGNOSIS — M25612 Stiffness of left shoulder, not elsewhere classified: Secondary | ICD-10-CM | POA: Diagnosis not present

## 2017-07-06 DIAGNOSIS — M25511 Pain in right shoulder: Secondary | ICD-10-CM | POA: Diagnosis not present

## 2017-07-06 DIAGNOSIS — M25612 Stiffness of left shoulder, not elsewhere classified: Secondary | ICD-10-CM | POA: Diagnosis not present

## 2017-07-06 DIAGNOSIS — M25611 Stiffness of right shoulder, not elsewhere classified: Secondary | ICD-10-CM | POA: Diagnosis not present

## 2017-07-06 DIAGNOSIS — M25512 Pain in left shoulder: Secondary | ICD-10-CM | POA: Diagnosis not present

## 2017-07-07 DIAGNOSIS — M25612 Stiffness of left shoulder, not elsewhere classified: Secondary | ICD-10-CM | POA: Diagnosis not present

## 2017-07-07 DIAGNOSIS — M25511 Pain in right shoulder: Secondary | ICD-10-CM | POA: Diagnosis not present

## 2017-07-07 DIAGNOSIS — M25611 Stiffness of right shoulder, not elsewhere classified: Secondary | ICD-10-CM | POA: Diagnosis not present

## 2017-07-07 DIAGNOSIS — M25512 Pain in left shoulder: Secondary | ICD-10-CM | POA: Diagnosis not present

## 2017-07-09 ENCOUNTER — Telehealth: Payer: Self-pay | Admitting: Neurology

## 2017-07-09 DIAGNOSIS — M25611 Stiffness of right shoulder, not elsewhere classified: Secondary | ICD-10-CM | POA: Diagnosis not present

## 2017-07-09 DIAGNOSIS — M25512 Pain in left shoulder: Secondary | ICD-10-CM | POA: Diagnosis not present

## 2017-07-09 DIAGNOSIS — M25612 Stiffness of left shoulder, not elsewhere classified: Secondary | ICD-10-CM | POA: Diagnosis not present

## 2017-07-09 DIAGNOSIS — M25511 Pain in right shoulder: Secondary | ICD-10-CM | POA: Diagnosis not present

## 2017-07-09 NOTE — Telephone Encounter (Signed)
Pt called and said she is having a lot of arm pain and would like a call back to please advise and also she wanted to let Dr Tat know the therapy she is having also does dry needling and she said she is willing to try anything

## 2017-07-09 NOTE — Telephone Encounter (Signed)
Left message on machine for patient to call back.

## 2017-07-10 DIAGNOSIS — M79602 Pain in left arm: Secondary | ICD-10-CM | POA: Diagnosis not present

## 2017-07-10 NOTE — Telephone Encounter (Signed)
Left message on machine for patient to call back.

## 2017-07-10 NOTE — Telephone Encounter (Signed)
Pt returned your call.  

## 2017-07-10 NOTE — Telephone Encounter (Signed)
Spoke with patient. She states starting two weeks ago she has developed severe pain in her left upper arm. She describes it as muscle stiffness/cramping. It is in the upper arm but travels up to the neck and ear. She thought this could be pain from muscle rigidity with Parkinson's disease. Made her aware if this came on all of the sudden and she is having shooting pains that it does not sound related to Parkinson's and she should start with evaluation by her PCP. She is in physical therapy already from problems with her right shoulder.  Made her aware I would let Dr. Carles Collet know and call her back if she has any alternative suggestions. She will go ahead and make appt with PCP.

## 2017-07-10 NOTE — Telephone Encounter (Signed)
Called patient back and she was made aware of recommendation.  States she is not very concerned about heart issues because she walks 2-3 miles a day. No other symptoms. No chest pain. No SOB. No trouble with blood pressure. When she feels the area in arm that is hurting it feels like two "knots". Heat alleviates the pain some.  She will consider going to the urgent care to r/o heart problems based on recommendation and expressed appreciation.

## 2017-07-10 NOTE — Telephone Encounter (Signed)
L arm that radiates into the left neck and ear is cardiac until proven otherwise.  Go to the ER.  Find out if any chest pain too.

## 2017-07-13 DIAGNOSIS — M25511 Pain in right shoulder: Secondary | ICD-10-CM | POA: Diagnosis not present

## 2017-07-13 DIAGNOSIS — M79602 Pain in left arm: Secondary | ICD-10-CM | POA: Diagnosis not present

## 2017-07-13 DIAGNOSIS — M25611 Stiffness of right shoulder, not elsewhere classified: Secondary | ICD-10-CM | POA: Diagnosis not present

## 2017-07-13 DIAGNOSIS — M25512 Pain in left shoulder: Secondary | ICD-10-CM | POA: Diagnosis not present

## 2017-07-13 DIAGNOSIS — M25612 Stiffness of left shoulder, not elsewhere classified: Secondary | ICD-10-CM | POA: Diagnosis not present

## 2017-07-15 DIAGNOSIS — M25611 Stiffness of right shoulder, not elsewhere classified: Secondary | ICD-10-CM | POA: Diagnosis not present

## 2017-07-15 DIAGNOSIS — M25511 Pain in right shoulder: Secondary | ICD-10-CM | POA: Diagnosis not present

## 2017-07-15 DIAGNOSIS — M25612 Stiffness of left shoulder, not elsewhere classified: Secondary | ICD-10-CM | POA: Diagnosis not present

## 2017-07-15 DIAGNOSIS — M25512 Pain in left shoulder: Secondary | ICD-10-CM | POA: Diagnosis not present

## 2017-07-16 DIAGNOSIS — M25512 Pain in left shoulder: Secondary | ICD-10-CM | POA: Diagnosis not present

## 2017-07-16 DIAGNOSIS — M25611 Stiffness of right shoulder, not elsewhere classified: Secondary | ICD-10-CM | POA: Diagnosis not present

## 2017-07-16 DIAGNOSIS — M25511 Pain in right shoulder: Secondary | ICD-10-CM | POA: Diagnosis not present

## 2017-07-16 DIAGNOSIS — M25612 Stiffness of left shoulder, not elsewhere classified: Secondary | ICD-10-CM | POA: Diagnosis not present

## 2017-07-22 DIAGNOSIS — M25612 Stiffness of left shoulder, not elsewhere classified: Secondary | ICD-10-CM | POA: Diagnosis not present

## 2017-07-22 DIAGNOSIS — M25511 Pain in right shoulder: Secondary | ICD-10-CM | POA: Diagnosis not present

## 2017-07-22 DIAGNOSIS — M25611 Stiffness of right shoulder, not elsewhere classified: Secondary | ICD-10-CM | POA: Diagnosis not present

## 2017-07-22 DIAGNOSIS — M25512 Pain in left shoulder: Secondary | ICD-10-CM | POA: Diagnosis not present

## 2017-07-23 DIAGNOSIS — M25612 Stiffness of left shoulder, not elsewhere classified: Secondary | ICD-10-CM | POA: Diagnosis not present

## 2017-07-23 DIAGNOSIS — M25511 Pain in right shoulder: Secondary | ICD-10-CM | POA: Diagnosis not present

## 2017-07-23 DIAGNOSIS — M25611 Stiffness of right shoulder, not elsewhere classified: Secondary | ICD-10-CM | POA: Diagnosis not present

## 2017-07-23 DIAGNOSIS — M25512 Pain in left shoulder: Secondary | ICD-10-CM | POA: Diagnosis not present

## 2017-07-24 DIAGNOSIS — M25612 Stiffness of left shoulder, not elsewhere classified: Secondary | ICD-10-CM | POA: Diagnosis not present

## 2017-07-24 DIAGNOSIS — M25611 Stiffness of right shoulder, not elsewhere classified: Secondary | ICD-10-CM | POA: Diagnosis not present

## 2017-07-24 DIAGNOSIS — M25512 Pain in left shoulder: Secondary | ICD-10-CM | POA: Diagnosis not present

## 2017-07-24 DIAGNOSIS — M25511 Pain in right shoulder: Secondary | ICD-10-CM | POA: Diagnosis not present

## 2017-07-27 DIAGNOSIS — M25512 Pain in left shoulder: Secondary | ICD-10-CM | POA: Diagnosis not present

## 2017-07-27 DIAGNOSIS — M25511 Pain in right shoulder: Secondary | ICD-10-CM | POA: Diagnosis not present

## 2017-07-27 DIAGNOSIS — M25611 Stiffness of right shoulder, not elsewhere classified: Secondary | ICD-10-CM | POA: Diagnosis not present

## 2017-07-27 DIAGNOSIS — M25612 Stiffness of left shoulder, not elsewhere classified: Secondary | ICD-10-CM | POA: Diagnosis not present

## 2017-07-29 DIAGNOSIS — M25612 Stiffness of left shoulder, not elsewhere classified: Secondary | ICD-10-CM | POA: Diagnosis not present

## 2017-07-29 DIAGNOSIS — M25611 Stiffness of right shoulder, not elsewhere classified: Secondary | ICD-10-CM | POA: Diagnosis not present

## 2017-07-29 DIAGNOSIS — M7542 Impingement syndrome of left shoulder: Secondary | ICD-10-CM | POA: Diagnosis not present

## 2017-07-29 DIAGNOSIS — M25512 Pain in left shoulder: Secondary | ICD-10-CM | POA: Diagnosis not present

## 2017-07-29 DIAGNOSIS — M25511 Pain in right shoulder: Secondary | ICD-10-CM | POA: Diagnosis not present

## 2017-07-31 DIAGNOSIS — M25512 Pain in left shoulder: Secondary | ICD-10-CM | POA: Diagnosis not present

## 2017-07-31 DIAGNOSIS — M25612 Stiffness of left shoulder, not elsewhere classified: Secondary | ICD-10-CM | POA: Diagnosis not present

## 2017-07-31 DIAGNOSIS — M25611 Stiffness of right shoulder, not elsewhere classified: Secondary | ICD-10-CM | POA: Diagnosis not present

## 2017-07-31 DIAGNOSIS — M25511 Pain in right shoulder: Secondary | ICD-10-CM | POA: Diagnosis not present

## 2017-08-02 DIAGNOSIS — M25611 Stiffness of right shoulder, not elsewhere classified: Secondary | ICD-10-CM | POA: Diagnosis not present

## 2017-08-02 DIAGNOSIS — M25512 Pain in left shoulder: Secondary | ICD-10-CM | POA: Diagnosis not present

## 2017-08-02 DIAGNOSIS — M25511 Pain in right shoulder: Secondary | ICD-10-CM | POA: Diagnosis not present

## 2017-08-02 DIAGNOSIS — M25612 Stiffness of left shoulder, not elsewhere classified: Secondary | ICD-10-CM | POA: Diagnosis not present

## 2017-08-06 DIAGNOSIS — M25612 Stiffness of left shoulder, not elsewhere classified: Secondary | ICD-10-CM | POA: Diagnosis not present

## 2017-08-06 DIAGNOSIS — M25611 Stiffness of right shoulder, not elsewhere classified: Secondary | ICD-10-CM | POA: Diagnosis not present

## 2017-08-06 DIAGNOSIS — M25511 Pain in right shoulder: Secondary | ICD-10-CM | POA: Diagnosis not present

## 2017-08-06 DIAGNOSIS — M25512 Pain in left shoulder: Secondary | ICD-10-CM | POA: Diagnosis not present

## 2017-08-07 DIAGNOSIS — M25512 Pain in left shoulder: Secondary | ICD-10-CM | POA: Diagnosis not present

## 2017-08-07 DIAGNOSIS — M25611 Stiffness of right shoulder, not elsewhere classified: Secondary | ICD-10-CM | POA: Diagnosis not present

## 2017-08-07 DIAGNOSIS — M25511 Pain in right shoulder: Secondary | ICD-10-CM | POA: Diagnosis not present

## 2017-08-07 DIAGNOSIS — M25612 Stiffness of left shoulder, not elsewhere classified: Secondary | ICD-10-CM | POA: Diagnosis not present

## 2017-08-10 DIAGNOSIS — M25612 Stiffness of left shoulder, not elsewhere classified: Secondary | ICD-10-CM | POA: Diagnosis not present

## 2017-08-10 DIAGNOSIS — M25511 Pain in right shoulder: Secondary | ICD-10-CM | POA: Diagnosis not present

## 2017-08-10 DIAGNOSIS — M25512 Pain in left shoulder: Secondary | ICD-10-CM | POA: Diagnosis not present

## 2017-08-10 DIAGNOSIS — M25611 Stiffness of right shoulder, not elsewhere classified: Secondary | ICD-10-CM | POA: Diagnosis not present

## 2017-08-13 DIAGNOSIS — M25611 Stiffness of right shoulder, not elsewhere classified: Secondary | ICD-10-CM | POA: Diagnosis not present

## 2017-08-13 DIAGNOSIS — M25612 Stiffness of left shoulder, not elsewhere classified: Secondary | ICD-10-CM | POA: Diagnosis not present

## 2017-08-13 DIAGNOSIS — M25512 Pain in left shoulder: Secondary | ICD-10-CM | POA: Diagnosis not present

## 2017-08-13 DIAGNOSIS — M25511 Pain in right shoulder: Secondary | ICD-10-CM | POA: Diagnosis not present

## 2017-08-14 DIAGNOSIS — M25611 Stiffness of right shoulder, not elsewhere classified: Secondary | ICD-10-CM | POA: Diagnosis not present

## 2017-08-14 DIAGNOSIS — M25511 Pain in right shoulder: Secondary | ICD-10-CM | POA: Diagnosis not present

## 2017-08-14 DIAGNOSIS — M25612 Stiffness of left shoulder, not elsewhere classified: Secondary | ICD-10-CM | POA: Diagnosis not present

## 2017-08-14 DIAGNOSIS — M25512 Pain in left shoulder: Secondary | ICD-10-CM | POA: Diagnosis not present

## 2017-08-17 DIAGNOSIS — M25611 Stiffness of right shoulder, not elsewhere classified: Secondary | ICD-10-CM | POA: Diagnosis not present

## 2017-08-17 DIAGNOSIS — L821 Other seborrheic keratosis: Secondary | ICD-10-CM | POA: Diagnosis not present

## 2017-08-17 DIAGNOSIS — M25512 Pain in left shoulder: Secondary | ICD-10-CM | POA: Diagnosis not present

## 2017-08-17 DIAGNOSIS — D0439 Carcinoma in situ of skin of other parts of face: Secondary | ICD-10-CM | POA: Diagnosis not present

## 2017-08-17 DIAGNOSIS — M25511 Pain in right shoulder: Secondary | ICD-10-CM | POA: Diagnosis not present

## 2017-08-17 DIAGNOSIS — L570 Actinic keratosis: Secondary | ICD-10-CM | POA: Diagnosis not present

## 2017-08-17 DIAGNOSIS — M25612 Stiffness of left shoulder, not elsewhere classified: Secondary | ICD-10-CM | POA: Diagnosis not present

## 2017-08-17 DIAGNOSIS — D485 Neoplasm of uncertain behavior of skin: Secondary | ICD-10-CM | POA: Diagnosis not present

## 2017-08-19 DIAGNOSIS — M25612 Stiffness of left shoulder, not elsewhere classified: Secondary | ICD-10-CM | POA: Diagnosis not present

## 2017-08-19 DIAGNOSIS — M25611 Stiffness of right shoulder, not elsewhere classified: Secondary | ICD-10-CM | POA: Diagnosis not present

## 2017-08-19 DIAGNOSIS — M25511 Pain in right shoulder: Secondary | ICD-10-CM | POA: Diagnosis not present

## 2017-08-19 DIAGNOSIS — M25512 Pain in left shoulder: Secondary | ICD-10-CM | POA: Diagnosis not present

## 2017-08-21 DIAGNOSIS — M25611 Stiffness of right shoulder, not elsewhere classified: Secondary | ICD-10-CM | POA: Diagnosis not present

## 2017-08-21 DIAGNOSIS — M25612 Stiffness of left shoulder, not elsewhere classified: Secondary | ICD-10-CM | POA: Diagnosis not present

## 2017-08-21 DIAGNOSIS — M25512 Pain in left shoulder: Secondary | ICD-10-CM | POA: Diagnosis not present

## 2017-08-21 DIAGNOSIS — M25511 Pain in right shoulder: Secondary | ICD-10-CM | POA: Diagnosis not present

## 2017-08-24 DIAGNOSIS — M25511 Pain in right shoulder: Secondary | ICD-10-CM | POA: Diagnosis not present

## 2017-08-24 DIAGNOSIS — M25512 Pain in left shoulder: Secondary | ICD-10-CM | POA: Diagnosis not present

## 2017-08-24 DIAGNOSIS — M25611 Stiffness of right shoulder, not elsewhere classified: Secondary | ICD-10-CM | POA: Diagnosis not present

## 2017-08-24 DIAGNOSIS — M25612 Stiffness of left shoulder, not elsewhere classified: Secondary | ICD-10-CM | POA: Diagnosis not present

## 2017-08-26 DIAGNOSIS — M25612 Stiffness of left shoulder, not elsewhere classified: Secondary | ICD-10-CM | POA: Diagnosis not present

## 2017-08-26 DIAGNOSIS — M25511 Pain in right shoulder: Secondary | ICD-10-CM | POA: Diagnosis not present

## 2017-08-26 DIAGNOSIS — M25611 Stiffness of right shoulder, not elsewhere classified: Secondary | ICD-10-CM | POA: Diagnosis not present

## 2017-08-26 DIAGNOSIS — M25512 Pain in left shoulder: Secondary | ICD-10-CM | POA: Diagnosis not present

## 2017-08-28 DIAGNOSIS — M25611 Stiffness of right shoulder, not elsewhere classified: Secondary | ICD-10-CM | POA: Diagnosis not present

## 2017-08-28 DIAGNOSIS — M25511 Pain in right shoulder: Secondary | ICD-10-CM | POA: Diagnosis not present

## 2017-08-28 DIAGNOSIS — M25512 Pain in left shoulder: Secondary | ICD-10-CM | POA: Diagnosis not present

## 2017-08-28 DIAGNOSIS — M25612 Stiffness of left shoulder, not elsewhere classified: Secondary | ICD-10-CM | POA: Diagnosis not present

## 2017-08-31 DIAGNOSIS — M25511 Pain in right shoulder: Secondary | ICD-10-CM | POA: Diagnosis not present

## 2017-08-31 DIAGNOSIS — M25611 Stiffness of right shoulder, not elsewhere classified: Secondary | ICD-10-CM | POA: Diagnosis not present

## 2017-08-31 DIAGNOSIS — M25612 Stiffness of left shoulder, not elsewhere classified: Secondary | ICD-10-CM | POA: Diagnosis not present

## 2017-08-31 DIAGNOSIS — M7542 Impingement syndrome of left shoulder: Secondary | ICD-10-CM | POA: Diagnosis not present

## 2017-08-31 DIAGNOSIS — M25512 Pain in left shoulder: Secondary | ICD-10-CM | POA: Diagnosis not present

## 2017-09-01 DIAGNOSIS — D0439 Carcinoma in situ of skin of other parts of face: Secondary | ICD-10-CM | POA: Diagnosis not present

## 2017-09-02 DIAGNOSIS — M25511 Pain in right shoulder: Secondary | ICD-10-CM | POA: Diagnosis not present

## 2017-09-02 DIAGNOSIS — M25612 Stiffness of left shoulder, not elsewhere classified: Secondary | ICD-10-CM | POA: Diagnosis not present

## 2017-09-02 DIAGNOSIS — M25512 Pain in left shoulder: Secondary | ICD-10-CM | POA: Diagnosis not present

## 2017-09-02 DIAGNOSIS — M25611 Stiffness of right shoulder, not elsewhere classified: Secondary | ICD-10-CM | POA: Diagnosis not present

## 2017-09-04 DIAGNOSIS — M25511 Pain in right shoulder: Secondary | ICD-10-CM | POA: Diagnosis not present

## 2017-09-04 DIAGNOSIS — M25612 Stiffness of left shoulder, not elsewhere classified: Secondary | ICD-10-CM | POA: Diagnosis not present

## 2017-09-04 DIAGNOSIS — M25512 Pain in left shoulder: Secondary | ICD-10-CM | POA: Diagnosis not present

## 2017-09-04 DIAGNOSIS — M25611 Stiffness of right shoulder, not elsewhere classified: Secondary | ICD-10-CM | POA: Diagnosis not present

## 2017-09-07 DIAGNOSIS — M25511 Pain in right shoulder: Secondary | ICD-10-CM | POA: Diagnosis not present

## 2017-09-07 DIAGNOSIS — M25512 Pain in left shoulder: Secondary | ICD-10-CM | POA: Diagnosis not present

## 2017-09-07 DIAGNOSIS — M25612 Stiffness of left shoulder, not elsewhere classified: Secondary | ICD-10-CM | POA: Diagnosis not present

## 2017-09-07 DIAGNOSIS — M25611 Stiffness of right shoulder, not elsewhere classified: Secondary | ICD-10-CM | POA: Diagnosis not present

## 2017-09-09 DIAGNOSIS — M25512 Pain in left shoulder: Secondary | ICD-10-CM | POA: Diagnosis not present

## 2017-09-09 DIAGNOSIS — M25611 Stiffness of right shoulder, not elsewhere classified: Secondary | ICD-10-CM | POA: Diagnosis not present

## 2017-09-09 DIAGNOSIS — M25612 Stiffness of left shoulder, not elsewhere classified: Secondary | ICD-10-CM | POA: Diagnosis not present

## 2017-09-09 DIAGNOSIS — M25511 Pain in right shoulder: Secondary | ICD-10-CM | POA: Diagnosis not present

## 2017-09-11 DIAGNOSIS — M25512 Pain in left shoulder: Secondary | ICD-10-CM | POA: Diagnosis not present

## 2017-09-11 DIAGNOSIS — M25611 Stiffness of right shoulder, not elsewhere classified: Secondary | ICD-10-CM | POA: Diagnosis not present

## 2017-09-11 DIAGNOSIS — M25511 Pain in right shoulder: Secondary | ICD-10-CM | POA: Diagnosis not present

## 2017-09-11 DIAGNOSIS — M25612 Stiffness of left shoulder, not elsewhere classified: Secondary | ICD-10-CM | POA: Diagnosis not present

## 2017-09-14 DIAGNOSIS — M25611 Stiffness of right shoulder, not elsewhere classified: Secondary | ICD-10-CM | POA: Diagnosis not present

## 2017-09-14 DIAGNOSIS — M25511 Pain in right shoulder: Secondary | ICD-10-CM | POA: Diagnosis not present

## 2017-09-14 DIAGNOSIS — M25612 Stiffness of left shoulder, not elsewhere classified: Secondary | ICD-10-CM | POA: Diagnosis not present

## 2017-09-14 DIAGNOSIS — M25512 Pain in left shoulder: Secondary | ICD-10-CM | POA: Diagnosis not present

## 2017-09-16 DIAGNOSIS — M25612 Stiffness of left shoulder, not elsewhere classified: Secondary | ICD-10-CM | POA: Diagnosis not present

## 2017-09-16 DIAGNOSIS — M25611 Stiffness of right shoulder, not elsewhere classified: Secondary | ICD-10-CM | POA: Diagnosis not present

## 2017-09-16 DIAGNOSIS — M25512 Pain in left shoulder: Secondary | ICD-10-CM | POA: Diagnosis not present

## 2017-09-16 DIAGNOSIS — M25511 Pain in right shoulder: Secondary | ICD-10-CM | POA: Diagnosis not present

## 2017-09-18 DIAGNOSIS — M25512 Pain in left shoulder: Secondary | ICD-10-CM | POA: Diagnosis not present

## 2017-09-18 DIAGNOSIS — M25612 Stiffness of left shoulder, not elsewhere classified: Secondary | ICD-10-CM | POA: Diagnosis not present

## 2017-09-18 DIAGNOSIS — M25511 Pain in right shoulder: Secondary | ICD-10-CM | POA: Diagnosis not present

## 2017-09-18 DIAGNOSIS — M25611 Stiffness of right shoulder, not elsewhere classified: Secondary | ICD-10-CM | POA: Diagnosis not present

## 2017-09-21 DIAGNOSIS — M25512 Pain in left shoulder: Secondary | ICD-10-CM | POA: Diagnosis not present

## 2017-09-21 DIAGNOSIS — M25612 Stiffness of left shoulder, not elsewhere classified: Secondary | ICD-10-CM | POA: Diagnosis not present

## 2017-09-21 DIAGNOSIS — M25511 Pain in right shoulder: Secondary | ICD-10-CM | POA: Diagnosis not present

## 2017-09-21 DIAGNOSIS — M25611 Stiffness of right shoulder, not elsewhere classified: Secondary | ICD-10-CM | POA: Diagnosis not present

## 2017-09-24 DIAGNOSIS — M791 Myalgia, unspecified site: Secondary | ICD-10-CM | POA: Diagnosis not present

## 2017-09-24 DIAGNOSIS — M25611 Stiffness of right shoulder, not elsewhere classified: Secondary | ICD-10-CM | POA: Diagnosis not present

## 2017-09-24 DIAGNOSIS — M25512 Pain in left shoulder: Secondary | ICD-10-CM | POA: Diagnosis not present

## 2017-09-24 DIAGNOSIS — G20 Parkinson's disease: Secondary | ICD-10-CM | POA: Diagnosis not present

## 2017-09-24 DIAGNOSIS — M25612 Stiffness of left shoulder, not elsewhere classified: Secondary | ICD-10-CM | POA: Diagnosis not present

## 2017-09-24 DIAGNOSIS — M2550 Pain in unspecified joint: Secondary | ICD-10-CM | POA: Diagnosis not present

## 2017-09-24 DIAGNOSIS — M25511 Pain in right shoulder: Secondary | ICD-10-CM | POA: Diagnosis not present

## 2017-09-25 DIAGNOSIS — M25511 Pain in right shoulder: Secondary | ICD-10-CM | POA: Diagnosis not present

## 2017-09-25 DIAGNOSIS — M25611 Stiffness of right shoulder, not elsewhere classified: Secondary | ICD-10-CM | POA: Diagnosis not present

## 2017-09-25 DIAGNOSIS — M25612 Stiffness of left shoulder, not elsewhere classified: Secondary | ICD-10-CM | POA: Diagnosis not present

## 2017-09-25 DIAGNOSIS — M25512 Pain in left shoulder: Secondary | ICD-10-CM | POA: Diagnosis not present

## 2017-09-28 DIAGNOSIS — M25612 Stiffness of left shoulder, not elsewhere classified: Secondary | ICD-10-CM | POA: Diagnosis not present

## 2017-09-28 DIAGNOSIS — M25611 Stiffness of right shoulder, not elsewhere classified: Secondary | ICD-10-CM | POA: Diagnosis not present

## 2017-09-28 DIAGNOSIS — M25511 Pain in right shoulder: Secondary | ICD-10-CM | POA: Diagnosis not present

## 2017-09-28 DIAGNOSIS — M25512 Pain in left shoulder: Secondary | ICD-10-CM | POA: Diagnosis not present

## 2017-09-30 DIAGNOSIS — M25612 Stiffness of left shoulder, not elsewhere classified: Secondary | ICD-10-CM | POA: Diagnosis not present

## 2017-09-30 DIAGNOSIS — M25511 Pain in right shoulder: Secondary | ICD-10-CM | POA: Diagnosis not present

## 2017-09-30 DIAGNOSIS — M25512 Pain in left shoulder: Secondary | ICD-10-CM | POA: Diagnosis not present

## 2017-09-30 DIAGNOSIS — M25611 Stiffness of right shoulder, not elsewhere classified: Secondary | ICD-10-CM | POA: Diagnosis not present

## 2017-10-02 DIAGNOSIS — M25611 Stiffness of right shoulder, not elsewhere classified: Secondary | ICD-10-CM | POA: Diagnosis not present

## 2017-10-02 DIAGNOSIS — M25511 Pain in right shoulder: Secondary | ICD-10-CM | POA: Diagnosis not present

## 2017-10-02 DIAGNOSIS — M25612 Stiffness of left shoulder, not elsewhere classified: Secondary | ICD-10-CM | POA: Diagnosis not present

## 2017-10-02 DIAGNOSIS — M25512 Pain in left shoulder: Secondary | ICD-10-CM | POA: Diagnosis not present

## 2017-10-05 DIAGNOSIS — M25611 Stiffness of right shoulder, not elsewhere classified: Secondary | ICD-10-CM | POA: Diagnosis not present

## 2017-10-05 DIAGNOSIS — M25511 Pain in right shoulder: Secondary | ICD-10-CM | POA: Diagnosis not present

## 2017-10-05 DIAGNOSIS — M25512 Pain in left shoulder: Secondary | ICD-10-CM | POA: Diagnosis not present

## 2017-10-05 DIAGNOSIS — M25612 Stiffness of left shoulder, not elsewhere classified: Secondary | ICD-10-CM | POA: Diagnosis not present

## 2017-10-06 DIAGNOSIS — M25512 Pain in left shoulder: Secondary | ICD-10-CM | POA: Diagnosis not present

## 2017-10-06 DIAGNOSIS — M25611 Stiffness of right shoulder, not elsewhere classified: Secondary | ICD-10-CM | POA: Diagnosis not present

## 2017-10-06 DIAGNOSIS — M25612 Stiffness of left shoulder, not elsewhere classified: Secondary | ICD-10-CM | POA: Diagnosis not present

## 2017-10-06 DIAGNOSIS — M25511 Pain in right shoulder: Secondary | ICD-10-CM | POA: Diagnosis not present

## 2017-10-09 DIAGNOSIS — M25612 Stiffness of left shoulder, not elsewhere classified: Secondary | ICD-10-CM | POA: Diagnosis not present

## 2017-10-09 DIAGNOSIS — M25511 Pain in right shoulder: Secondary | ICD-10-CM | POA: Diagnosis not present

## 2017-10-09 DIAGNOSIS — M25512 Pain in left shoulder: Secondary | ICD-10-CM | POA: Diagnosis not present

## 2017-10-09 DIAGNOSIS — M25611 Stiffness of right shoulder, not elsewhere classified: Secondary | ICD-10-CM | POA: Diagnosis not present

## 2017-10-14 ENCOUNTER — Ambulatory Visit: Payer: Medicare Other | Admitting: Neurology

## 2017-10-16 DIAGNOSIS — M25611 Stiffness of right shoulder, not elsewhere classified: Secondary | ICD-10-CM | POA: Diagnosis not present

## 2017-10-16 DIAGNOSIS — M25512 Pain in left shoulder: Secondary | ICD-10-CM | POA: Diagnosis not present

## 2017-10-16 DIAGNOSIS — M25612 Stiffness of left shoulder, not elsewhere classified: Secondary | ICD-10-CM | POA: Diagnosis not present

## 2017-10-16 DIAGNOSIS — M25511 Pain in right shoulder: Secondary | ICD-10-CM | POA: Diagnosis not present

## 2017-10-19 NOTE — Progress Notes (Signed)
Ashley Neal was seen today in the movement disorders clinic for neurologic consultation at the request of Leighton Ruff, MD.  The consultation is for the evaluation of tremor and gait change.  Pt states that it all started after a long bus trip that triggered back pain in may 2017.  Pt was just recently seen by Dr. Rexene Alberts for the same thing and I have reviewed her records.  Dr. Rexene Alberts felt that the patient showed subtle signs of mild parkinsonism and they decided to take a wait and see approach.  She states that she googled her sx's and just "knows" she has parkinsons.  3/12/19update:  Pt seen in f/u.  She didn't want medication last visit but called back the following day asking about medication.  She was given carbidopa/levodopa 25/100 but decided not to take it because her friends/support group colleagues told her not to take medication or she wouldn't qualify for clinical trials.  She was told by her physical therapist and ortho surgeon that she didn't have PD.  She asks me about how one dx PD.    She did call about arm pain since last visit.  She was told that she saw her ortho surgeon and was told she had rotator cuff problem.  She was sent to PT.  She also had a shoulder injection and it was not helpful.  She then went to a walk in [facility] for that shoulder pain and saw an APP and was told she didn't have PD.  She then looked up the sx's and she doesn't think that she has the disease (just the opposite of what she said last visit).    PREVIOUS MEDICATIONS: none to date  ALLERGIES:   Allergies  Allergen Reactions  . Codeine Other (See Comments)    unknown  . Tape Other (See Comments)    unknown    CURRENT MEDICATIONS:  Outpatient Encounter Medications as of 10/20/2017  Medication Sig  . aspirin EC 81 MG tablet Take 81 mg by mouth daily.  Marland Kitchen lisinopril-hydrochlorothiazide (PRINZIDE,ZESTORETIC) 10-12.5 MG tablet Take 1 tablet by mouth daily.  . Multiple Vitamin (MULTIVITAMIN) tablet  Take 1 tablet by mouth daily.  . [DISCONTINUED] ALPRAZolam (XANAX) 0.25 MG tablet Take 0.25 mg by mouth daily as needed for anxiety.  . [DISCONTINUED] CALCIUM PO Take 1,500 mg by mouth daily.  . [DISCONTINUED] carbidopa-levodopa (SINEMET IR) 25-100 MG tablet Take 1 tablet 3 (three) times daily by mouth.  . [DISCONTINUED] Cholecalciferol (VITAMIN D) 2000 units CAPS Take 2,000 Units by mouth daily.   No facility-administered encounter medications on file as of 10/20/2017.     PAST MEDICAL HISTORY:   Past Medical History:  Diagnosis Date  . Frequent headaches   . GERD (gastroesophageal reflux disease)   . High cholesterol   . Hyperlipidemia   . Hypertension   . Osteoarthritis   . Osteoporosis   . Ulcerative colitis ([place])     PAST SURGICAL HISTORY:   Past Surgical History:  Procedure Laterality Date  . CARPAL TUNNEL RELEASE Bilateral 1996/1997  . TUBAL LIGATION  1980's    SOCIAL HISTORY:   Social History   Socioeconomic History  . Marital status: Divorced    Spouse name: Not on file  . Number of children: Not on file  . Years of education: Not on file  . Highest education level: Not on file  Social Needs  . Financial resource strain: Not on file  . Food insecurity - worry: Not on file  .  Food insecurity - inability: Not on file  . Transportation needs - medical: Not on file  . Transportation needs - non-medical: Not on file  Occupational History  . Occupation: retired    Comment: Chiropractor  Tobacco Use  . Smoking status: Never Smoker  . Smokeless tobacco: Never Used  Substance and Sexual Activity  . Alcohol use: Yes    Comment: rare  . Drug use: No  . Sexual activity: Not on file  Other Topics Concern  . Not on file  Social History Narrative  . Not on file    FAMILY HISTORY:   Family Status  Relation Name Status  . Mother  Deceased  . Father  Deceased  . Sister  Alive  . Brother 2 Alive  . Brother  Deceased  . Son Social research officer, government  . Mat Aunt  Deceased      ROS:  A complete 10 system review of systems was obtained and was unremarkable apart from what is mentioned above.  PHYSICAL EXAMINATION:    VITALS:   Vitals:   10/20/17 1520  BP: 102/62  Pulse: 82  SpO2: 97%  Weight: 160 lb (72.6 kg)  Height: 5\' 1"  (1.549 m)    GEN:  The patient appears stated age and is in NAD. HEENT:  Normocephalic, atraumatic.  The mucous membranes are moist. The superficial temporal arteries are without ropiness or tenderness. CV:  RRR Lungs:  CTAB Neck/HEME:  There are no carotid bruits bilaterally.  Neurological examination:  Orientation: The patient is alert and oriented x3.  Cranial nerves: There is good facial symmetry. There is mild facial hypomimia.  The visual fields are full to confrontational testing. The speech is fluent and clear. Soft palate rises symmetrically and there is no tongue deviation. Hearing is intact to conversational tone. Sensation: Sensation is intact to light touch throughout Motor: Strength is 5/5 in the bilateral upper and lower extremities.   Shoulder shrug is equal and symmetric.  There is no pronator drift.   Movement examination: Tone: There is mild increased tone bilaterally only with activation procedures  Abnormal movements: none even with distraction procedures Coordination:  There is some decremation with RAM's, seen with all forms of RAM's with any form of RAMS, including alternating supination and pronation of the forearm, hand opening and closing, finger taps, heel taps and toe taps.   Gait and Station: The patient has no difficulty arising out of a deep-seated chair without the use of the hands. The patient's stride length is normal.  The patient has a negative pull test.      Dr. Guadelupe Sabin records indicate that the patient had blood work from her primary care physician on 04/02/2017 which demonstrated normal CMP with a glucose level of 105, total cholesterol of 107, triglycerides 171, LDL 126. Recent A1c on  04/09/2017 was borderline at 5.8. CBC with differential was normal, B12 434. N TSH and N CK level on 04/09/17.   ASSESSMENT/PLAN:  1.  Parkinsonism.   -The patient had multiple questions today.  I answered them to the best of my ability.  I explained that she has features of parkinsonism, but they are very mild.  This is the same thing that she was previously told by Dr. Rexene Alberts, and myself.  I explained to her what the [place] brain bank criteria was.  I told her that she does not need medication right now (refused it anyway).  Discussed that medication is truly symptomatic and she feels that she does not  have symptoms (different than last visit).  I told her the best thing that she could do was get safe, cardiovascular exercise, particularly in the form of biking.  While she does exercise a lot, she walks and explained that that would not be enough for slowing disease.  -We talked about the value of dat scanning.  This may be helpful given all of the questions that she has.  I explained to her that this is an informative scan, rather than a diagnostic scan.  She is very claustrophobic.  I explained to her that this is not done in the MRI machine, but in a CT machine.  Showed her pictures of what this looks like.  She is nervous, and I told her that she does not need to do that, but thought it perhaps would help with some of the questions.  She was given information on DaT scan to read.  -I will plan on just doing surveillance for now and will plan on seeing her back in the next 6-9 months, sooner should new neurologic issues arise.  2.  Much greater than 50% of this visit was spent in counseling and coordinating care.  Total face to face time:  25 min   Cc:  Leighton Ruff, MD

## 2017-10-20 ENCOUNTER — Ambulatory Visit (INDEPENDENT_AMBULATORY_CARE_PROVIDER_SITE_OTHER): Payer: Medicare Other | Admitting: Neurology

## 2017-10-20 ENCOUNTER — Encounter: Payer: Self-pay | Admitting: Neurology

## 2017-10-20 VITALS — BP 102/62 | HR 82 | Ht 61.0 in | Wt 160.0 lb

## 2017-10-20 DIAGNOSIS — G20 Parkinson's disease: Secondary | ICD-10-CM

## 2017-10-27 ENCOUNTER — Ambulatory Visit: Payer: Medicare Other | Admitting: Neurology

## 2017-11-11 ENCOUNTER — Ambulatory Visit (INDEPENDENT_AMBULATORY_CARE_PROVIDER_SITE_OTHER): Payer: Medicare Other | Admitting: Neurology

## 2017-11-11 ENCOUNTER — Encounter: Payer: Self-pay | Admitting: Neurology

## 2017-11-11 VITALS — BP 134/79 | HR 80 | Ht 61.0 in | Wt 163.0 lb

## 2017-11-11 DIAGNOSIS — G20 Parkinson's disease: Secondary | ICD-10-CM | POA: Diagnosis not present

## 2017-11-11 NOTE — Patient Instructions (Addendum)
Your exam is about the same. We will continue to observe and follow you clinically. We will also await your rheumatology appointment. I will see you back in 6 months.

## 2017-11-11 NOTE — Progress Notes (Signed)
Subjective:    Patient ID: Ashley Neal is a 72 y.o. female.  HPI     Interim history:   Ashley Neal is a 72 year old right-handed woman with an underlying medical history of hypertension, hyperlipidemia, history of colitis, osteoarthritis, osteoporosis, reflux disease, anxiety and obesity, who presents for follow-up consultation of her right hand tremor and fine motor dyscontrol. The patient is unaccompanied today. [name] on 04/16/2017 at the request of her primary care physician, at which time she reported a several month history of intermittent right hand tremors and also additional symptoms including difficulty turning over in bed or certain movements when dancing. I suggested observation and a 6 month follow-up. I did suggest we could proceed with a brain MRI but she declined as she was highly claustrophobic and simple anti-anxiety medication would not help. She had interim appointments with Dr. Carles Collet on 06/09/17 and again on 10/20/17. I reviewed the notes. Potential symptomatic medication was discussed, a PET scan was also discussed.   Today, 11/11/2017: She reports having had aches and pains. She has seen orthopedics. She had blood work with PCP. She has an appointment with rheumatology pending. [facility] She tries to hydrate well. Has been in PT. No recent hand tremors. Joint pain and arthritis in hands.   Previously:  04/16/2017: (She) reports a several month history of a intermittent right hand tremor. She also reports difficulty with certain movements such as turning over in bed or certain movements when dancing. I reviewed your office note from 04/09/2017, which you kindly included. She reports recent weight loss without particularly trying to lose weight. Blood work through your office from 04/02/2017 was reviewed revealing normal CMP with a glucose level of 105, total cholesterol of 107, triglycerides 171, LDL 126. Recent A1c on 04/09/2017 was borderline  at 5.8. CBC with differential was normal, B12 434. N TSH and N CK level on 04/09/17.  No FHx of PD or ET, father had dementia and lived to be 90, mom died at 39 from metastatic endom cancer. One brother with asthma, 2 sister, younger brother with EtOH abuse, youngest brother died at 36 yo in a car accident.  She has R shoulder problems, has had back pain in the mid back. Has had dexterity issue. She saw ortho at [place], and has been to [facility] for PT.  She is doing exercises. 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  Has retired recently, 2 grown sons, one of them local, the other in [place].  She is a non-smoker, very infrequent.   Her Past Medical History Is Significant For: Past Medical History:  Diagnosis Date  . Frequent headaches   . GERD (gastroesophageal reflux disease)   . High cholesterol   . Hyperlipidemia   . Hypertension   . Osteoarthritis   . Osteoporosis   . Ulcerative colitis ([place])     Her Past Surgical History Is Significant For: Past Surgical History:  Procedure Laterality Date  . CARPAL TUNNEL RELEASE Bilateral 1996/1997  . TUBAL LIGATION  1980's    Her Family History Is Significant For: Family History  Problem Relation Age of Onset  . Endometrial cancer Mother   . Diabetes Mother   . Alzheimer's disease Father   . Diabetes Sister   . COPD Sister   . Heart disease Brother   . Other Brother        MVA   . Parkinson's disease Maternal Aunt  Her Social History Is Significant For: Social History   Socioeconomic History  . Marital status: Divorced    Spouse name: Not on file  . Number of children: Not on file  . Years of education: Not on file  . Highest education level: Not on file  Occupational History  . Occupation: retired    Comment: Chiropractor  Social Needs  . Financial resource strain: Not on file  . Food insecurity:    Worry: Not on file    Inability: Not on file  . Transportation needs:    Medical: Not on file     Non-medical: Not on file  Tobacco Use  . Smoking status: Never Smoker  . Smokeless tobacco: Never Used  Substance and Sexual Activity  . Alcohol use: Yes    Comment: rare  . Drug use: No  . Sexual activity: Not on file  Lifestyle  . Physical activity:    Days per week: Not on file    Minutes per session: Not on file  . Stress: Not on file  Relationships  . Social connections:    Talks on phone: Not on file    Gets together: Not on file    Attends religious service: Not on file    Active member of club or organization: Not on file    Attends meetings of clubs or organizations: Not on file    Relationship status: Not on file  Other Topics Concern  . Not on file  Social History Narrative  . Not on file    Her Allergies Are:  Allergies  Allergen Reactions  . Codeine Other (See Comments)    unknown  . Tape Other (See Comments)    unknown  :   Her Current Medications Are:  Outpatient Encounter Medications as of 11/11/2017  Medication Sig  . aspirin EC 81 MG tablet Take 81 mg by mouth daily.  Marland Kitchen lisinopril-hydrochlorothiazide (PRINZIDE,ZESTORETIC) 10-12.5 MG tablet Take 1 tablet by mouth daily.  . Multiple Vitamin (MULTIVITAMIN) tablet Take 1 tablet by mouth daily.   No facility-administered encounter medications on file as of 11/11/2017.   :  Review of Systems:  Out of a complete 14 point review of systems, all are reviewed and negative with the exception of these symptoms as listed below: Review of Systems  Neurological:       Pt presents today to discuss her questions regarding possible PD.    Objective:  Neurological Exam  Physical Exam Physical Examination:   Vitals:   11/11/17 1351  BP: 134/79  Pulse: 80    General Examination: The patient is a very pleasant 72 y.o. female in no acute distress. She appears well-developed and well-nourished and well groomed.   HEENT: Normocephalic, atraumatic, pupils are equal, round and reactive to light and  accommodation. Corrective eyeglasses in place.Extraocular tracking is good without limitation to gaze excursion or nystagmus noted. Normal smooth pursuit is noted. Hearing is grossly intact. Face is symmetric with normal facial sensation and mild facial masking noted. Speech is clear, no dysarthria, no sialorrhea. She has no significant hypophonia, perhaps mild. She has good range of motion. She has a benign airway exam.   Chest, heart, abdomen and extremities:   Chest is clear to auscultation, heart sounds normal. There  are no significant abnormalities noted, but prominent arthritic changes in both hands, left more than right, puffy ankles but not in the realm of pitting edema.   Skin: Warm and dry without trophic changes noted.  Musculoskeletal:  Good range of motion, difficulty with hand movements secondary to arthritis and trigger finger-like changes.   Neurologically:  Mental status: The patient is awake, alert and oriented in all 4 spheres. Her immediate and remote memory, attention, language skills and fund of knowledge are appropriate. There is no evidence of aphasia, agnosia, apraxia or anomia. Speech is clear with normal prosody and enunciation. Thought process is linear. Mood is normal and affect is normal.  Cranial nerves II - XII are as described above under HEENT exam. Left shoulder is ever so slightly higher than right, stable. Motor exam: Normal bulk, and strength is noted for age. She has a mild increase in tone in the right upper extremity.  (On 04/16/2017: On Archimedes spiral drawing there is no significant trembling noted. Her handwriting is not tremulous and legible, slightly micrographic.)    Reflexes are 1-2+ throughout. Fine motor skills and coordination: She has mild difficulty on the right with finger tap, hand movements, not so much with rapid alternating patting, left is Minimally impaired. Foot taps are mildly impaired on the right, minimally so on the left. She  has no resting tremor. No postural or action tremor. Cerebellar testing: No dysmetria or intention tremor on finger to nose testing. There is no truncal or gait ataxia.  Sensory exam: intact to light touch in the upper and lower extremities.  Gait, station and balance: She stands easily. No veering to one side is noted. No leaning to one side is noted. Posture is age-appropriate to minimally stooped. Stance is narrow based. she walks with good pace, fairly good stride length, very subtle decrease in arm swing on the right. She turns in 3 steps.   Assessment and Plan:   In summary, ANAKAREN CAMPION is a very pleasant 72 year old female with an underlying medical history of hypertension, hyperlipidemia, history of colitis, osteoarthritis, osteoporosis, reflux disease, anxiety and obesity, who presents for follow-up consultation of her intermittent right hand tremor, fine motor dyscontrol gait and posture changes. She has mild signs of right-sided parkinsonism. She has been struggling with aches and pains, joint pain and arthritis issues. She has an appointment with rheumatology pending for tomorrow. She had recent blood work through her PCP. She declined a brain MRI. She had a second opinion with Dr. Carles Collet recently also noticed right-sided parkinsonism. I suggested clinical observation and follow-up in 6 months. I encouraged her to stay well hydrated, well rested, exercise on a regular basis within her limitations of course. I answered all her questions today and she was in agreement. I spent 20 minutes in total face-to-face time with the patient, more than 50% of which was spent in counseling and coordination of care, reviewing test results, reviewing medication and discussing or reviewing the diagnosis of Parkinsonism, its prognosis and treatment options. Pertinent laboratory and imaging test results that were available during this visit with the patient were reviewed by me and considered in my medical  decision making (see chart for details).

## 2017-11-12 DIAGNOSIS — M19042 Primary osteoarthritis, left hand: Secondary | ICD-10-CM | POA: Diagnosis not present

## 2017-11-12 DIAGNOSIS — Z6830 Body mass index (BMI) 30.0-30.9, adult: Secondary | ICD-10-CM | POA: Diagnosis not present

## 2017-11-12 DIAGNOSIS — E669 Obesity, unspecified: Secondary | ICD-10-CM | POA: Diagnosis not present

## 2017-11-12 DIAGNOSIS — M19041 Primary osteoarthritis, right hand: Secondary | ICD-10-CM | POA: Diagnosis not present

## 2017-11-12 DIAGNOSIS — M546 Pain in thoracic spine: Secondary | ICD-10-CM | POA: Diagnosis not present

## 2017-11-12 DIAGNOSIS — Z79899 Other long term (current) drug therapy: Secondary | ICD-10-CM | POA: Diagnosis not present

## 2017-11-20 DIAGNOSIS — R7303 Prediabetes: Secondary | ICD-10-CM | POA: Diagnosis not present

## 2017-11-20 DIAGNOSIS — E7800 Pure hypercholesterolemia, unspecified: Secondary | ICD-10-CM | POA: Diagnosis not present

## 2017-11-20 DIAGNOSIS — K219 Gastro-esophageal reflux disease without esophagitis: Secondary | ICD-10-CM | POA: Diagnosis not present

## 2017-11-20 DIAGNOSIS — Z1389 Encounter for screening for other disorder: Secondary | ICD-10-CM | POA: Diagnosis not present

## 2017-11-20 DIAGNOSIS — Z1159 Encounter for screening for other viral diseases: Secondary | ICD-10-CM | POA: Diagnosis not present

## 2017-11-20 DIAGNOSIS — F419 Anxiety disorder, unspecified: Secondary | ICD-10-CM | POA: Diagnosis not present

## 2017-11-20 DIAGNOSIS — Z0000 Encounter for general adult medical examination without abnormal findings: Secondary | ICD-10-CM | POA: Diagnosis not present

## 2017-11-20 DIAGNOSIS — I10 Essential (primary) hypertension: Secondary | ICD-10-CM | POA: Diagnosis not present

## 2017-11-20 DIAGNOSIS — M810 Age-related osteoporosis without current pathological fracture: Secondary | ICD-10-CM | POA: Diagnosis not present

## 2017-11-30 ENCOUNTER — Telehealth: Payer: Self-pay | Admitting: Neurology

## 2017-11-30 MED ORDER — CARBIDOPA-LEVODOPA 25-100 MG PO TABS: ORAL_TABLET | ORAL | 5 refills | Status: DC

## 2017-11-30 NOTE — Telephone Encounter (Signed)
I called pt to discuss. No answer, left a message asking her to call me back.  We are not prescribing the pt any medication, and I am unsure of which medication she is referring to.

## 2017-11-30 NOTE — Telephone Encounter (Signed)
I called pt to discuss. No answer, left a message asking her to call me back. 

## 2017-11-30 NOTE — Telephone Encounter (Signed)
Pt called me back. She reports that she saw a rheumatologist and her "rhematoid panel came back clean" but was placed on meloxicam 15mg . However, she is still having joint pain and stiffness, is having trouble dressing herself, and "should not be feeling like this at my age." She has not told her rheumatologist that the meloxicam is not working and that she has "pain all over." She saw on TV today a man being interviewed about his parkinson's disease, and she reports that his symptoms match her symptoms almost exactly. She wants Dr. Rexene Alberts to re-evaluate her for PD. I encouraged pt to call her rheumatologist to discuss her joint pain and the meloxicam, since pt feels that her joint pain is not being helped by the meloxicam. I did offer pt the soonest appt of 02/04/18 with Dr. Rexene Alberts. Pt asked me to call her if something sooner comes available.

## 2017-11-30 NOTE — Addendum Note (Signed)
Addended by: Star Age on: 11/30/2017 03:30 PM   Modules accepted: Orders

## 2017-11-30 NOTE — Telephone Encounter (Signed)
Pt called requesting an appt stating she feels like she is on the wrong medication(unsure of name but states its 15 MG). Along with stating she had seen on TV that something with her same symptoms had been going through medication until they found out the underlying issue had been Parkinson's. Please call to advise

## 2017-11-30 NOTE — Telephone Encounter (Signed)
At this point, I would recommend a trial of Sinemet (generic name: carbidopa-levodopa) 25/100 mg: Take half a pill twice daily (8 AM and noon) for one week, then half a pill 3 times a day (8 AM, noon, and 4 PM) for one week, then one pill 3 times a day thereafter. Please try to take the medication away from you mealtimes, that is, ideally either one hour before or 2 hours after your meal to ensure optimal absorption. The medication can interfere with the protein content of your meal and trying to the protein in your food and therefore not get fully absorbed.  Common side effects reported are: Nausea, vomiting, sedation, confusion, lightheadedness. Rare side effects include hallucinations, severe nausea or vomiting, diarrhea and significant drop in blood pressure especially when going from lying to standing or from sitting to standing.   She can keep her appt in June and keep [place] posted as to how things are going in the interim.  Rx sent to pharmacy. Please talk to patient to advise and explain the above.

## 2017-12-01 NOTE — Telephone Encounter (Signed)
I called pt and explained Dr. Guadelupe Sabin recommendations regarding the sinemet and the titration schedule and the side effects. Pt is agreeable to starting sinemet and will let me know if she experiences any s/e, and will keep her appt in June. Pt verbalized understanding of the recommendations and had no further questions at the end of our conversation but knows to call back if questions/concerns arise.

## 2017-12-23 DIAGNOSIS — L821 Other seborrheic keratosis: Secondary | ICD-10-CM | POA: Diagnosis not present

## 2017-12-23 DIAGNOSIS — L738 Other specified follicular disorders: Secondary | ICD-10-CM | POA: Diagnosis not present

## 2017-12-23 DIAGNOSIS — Z85828 Personal history of other malignant neoplasm of skin: Secondary | ICD-10-CM | POA: Diagnosis not present

## 2018-01-06 DIAGNOSIS — F329 Major depressive disorder, single episode, unspecified: Secondary | ICD-10-CM | POA: Diagnosis not present

## 2018-01-06 DIAGNOSIS — G20 Parkinson's disease: Secondary | ICD-10-CM | POA: Diagnosis not present

## 2018-01-06 DIAGNOSIS — I10 Essential (primary) hypertension: Secondary | ICD-10-CM | POA: Diagnosis not present

## 2018-01-06 DIAGNOSIS — F418 Other specified anxiety disorders: Secondary | ICD-10-CM | POA: Diagnosis not present

## 2018-01-11 DIAGNOSIS — F4322 Adjustment disorder with anxiety: Secondary | ICD-10-CM | POA: Diagnosis not present

## 2018-01-26 DIAGNOSIS — F4322 Adjustment disorder with anxiety: Secondary | ICD-10-CM | POA: Diagnosis not present

## 2018-01-28 DIAGNOSIS — F418 Other specified anxiety disorders: Secondary | ICD-10-CM | POA: Diagnosis not present

## 2018-02-04 ENCOUNTER — Ambulatory Visit: Payer: Self-pay | Admitting: Neurology

## 2018-02-18 DIAGNOSIS — F4322 Adjustment disorder with anxiety: Secondary | ICD-10-CM | POA: Diagnosis not present

## 2018-02-22 ENCOUNTER — Telehealth: Payer: Self-pay | Admitting: Neurology

## 2018-02-22 MED ORDER — CARBIDOPA-LEVODOPA 25-100 MG PO TABS: 1.0000 | ORAL_TABLET | Freq: Three times a day (TID) | ORAL | 0 refills | Status: DC

## 2018-02-22 NOTE — Telephone Encounter (Signed)
Edie/[facility] [phone] pt will need #93 for this month so she won't run out.  FYI

## 2018-02-22 NOTE — Addendum Note (Signed)
Addended by: Lester Havana A on: 02/22/2018 04:40 PM   Modules accepted: Orders

## 2018-02-22 NOTE — Telephone Encounter (Signed)
Pt has called about refill on carbidopa-levodopa (SINEMET IR) 25-100 MG tablet  [place], [place] [phone] (Phone) [phone] (Fax)    Pt states [place] [facility] [place] never fills for 90 day only 30.  Pt is asking to be called because of the difficulty she is having in getting this refill

## 2018-02-22 NOTE — Telephone Encounter (Signed)
I called pt. She is taking the C/L 25-100 mg tablet TID. She is asking for a 90 day supply to be sent to [facility].

## 2018-03-04 DIAGNOSIS — F4322 Adjustment disorder with anxiety: Secondary | ICD-10-CM | POA: Diagnosis not present

## 2018-03-10 DIAGNOSIS — F4322 Adjustment disorder with anxiety: Secondary | ICD-10-CM | POA: Diagnosis not present

## 2018-03-29 ENCOUNTER — Encounter: Payer: Self-pay | Admitting: Neurology

## 2018-03-29 ENCOUNTER — Ambulatory Visit (INDEPENDENT_AMBULATORY_CARE_PROVIDER_SITE_OTHER): Payer: Medicare Other | Admitting: Neurology

## 2018-03-29 VITALS — BP 139/75 | HR 71 | Ht 60.5 in | Wt 150.0 lb

## 2018-03-29 DIAGNOSIS — G20 Parkinson's disease: Secondary | ICD-10-CM

## 2018-03-29 MED ORDER — CARBIDOPA-LEVODOPA 25-100 MG PO TABS
1.0000 | ORAL_TABLET | Freq: Three times a day (TID) | ORAL | 3 refills | Status: DC
Start: 2018-03-29 — End: 2018-09-29

## 2018-03-29 NOTE — Progress Notes (Signed)
Subjective:    Patient ID: Ashley Neal is a 72 y.o. female.  HPI     Interim history:   Ashley Neal is a 72 year old right-handed woman with an underlying medical history of hypertension, hyperlipidemia, history of colitis, osteoarthritis, osteoporosis, reflux disease, anxiety and obesity, who presents for follow-up consultation of her right sided parkinsonism. The patient is unaccompanied today and presents for a sooner than scheduled appointment.  I last saw her on 11/11/2017, at which time she reported diffuse aches and pains. She had seen orthopedics. She had blood work with PCP. She had seen Dr. Carles Collet twice. She was trying to exercise. She was advised to follow-up routinely and advised that we would continue to observe her and follow clinically.  She called in the interim in late April after she had seen rheumatology. She was advised to start Sinemet with gradual titration at the time.  Today, 03/29/2018: She reports doing okay. Taking C/L tid, sometimes forgets the midday dose. She feels mild improvement in her tremor. Her first dose is around 6 AM, then 2 PM, and 10 PM. No new symptoms. She works out at [employer] 3 days a week. She is participating in volunteer work which has kept her busy mentally and gets her out. She is overall quite pleased with how she is doing. She does not take Mobic for arthritis pain and take something only as needed if she is hurting. Sometimes she has more stiffness than others. No significant constipation, no significant mood or memory issues, no issues with sleep currently. No recent falls thankfully.    Previously:  [name] on 04/16/2017 at the request of her primary care physician, at which time she reported a several month history of intermittent right hand tremors and also additional symptoms including difficulty turning over in bed or certain movements when dancing. I suggested observation and a 6 month follow-up. I did suggest we could proceed  with a brain MRI but she declined as she was highly claustrophobic and simple anti-anxiety medication would not help. She had interim appointments with Dr. Carles Collet on 06/09/17 and again on 10/20/17. I reviewed the notes. Potential symptomatic medication was discussed, a PET scan was also discussed.      04/16/2017: (She) reports a several month history of a intermittent right hand tremor. She also reports difficulty with certain movements such as turning over in bed or certain movements when dancing. I reviewed your office note from 04/09/2017, which you kindly included. She reports recent weight loss without particularly trying to lose weight. Blood work through your office from 04/02/2017 was reviewed revealing normal CMP with a glucose level of 105, total cholesterol of 107, triglycerides 171, LDL 126. Recent A1c on 04/09/2017 was borderline at 5.8. CBC with differential was normal, B12 434. N TSH and N CK level on 04/09/17.  No FHx of PD or ET, father had dementia and lived to be 55, mom died at 56 from metastatic endom cancer. One brother with asthma, 2 sister, younger brother with EtOH abuse, youngest brother died at 53 yo in a car accident.  She has R shoulder problems, has had back pain in the mid back. Has had dexterity issue. She saw ortho at [facility], and has been to [facility] for PT.  She is doing exercises. 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  Has retired recently, 2 grown sons, one of them local, the other in [place].  She is a non-smoker, very infrequent.  Her  Past Medical History Is Significant For: Past Medical History:  Diagnosis Date  . Frequent headaches   . GERD (gastroesophageal reflux disease)   . High cholesterol   . Hyperlipidemia   . Hypertension   . Osteoarthritis   . Osteoporosis   . Ulcerative colitis ([place])     Her Past Surgical History Is Significant For: Past Surgical History:  Procedure Laterality Date  . CARPAL TUNNEL RELEASE Bilateral 1996/1997   . TUBAL LIGATION  1980's    Her Family History Is Significant For: Family History  Problem Relation Age of Onset  . Endometrial cancer Mother   . Diabetes Mother   . Alzheimer's disease Father   . Diabetes Sister   . COPD Sister   . Heart disease Brother   . Other Brother        MVA   . Parkinson's disease Maternal Aunt     Her Social History Is Significant For: Social History   Socioeconomic History  . Marital status: Divorced    Spouse name: Not on file  . Number of children: Not on file  . Years of education: Not on file  . Highest education level: Not on file  Occupational History  . Occupation: retired    Comment: Chiropractor  Social Needs  . Financial resource strain: Not on file  . Food insecurity:    Worry: Not on file    Inability: Not on file  . Transportation needs:    Medical: Not on file    Non-medical: Not on file  Tobacco Use  . Smoking status: Never Smoker  . Smokeless tobacco: Never Used  Substance and Sexual Activity  . Alcohol use: Yes    Comment: rare  . Drug use: No  . Sexual activity: Not on file  Lifestyle  . Physical activity:    Days per week: Not on file    Minutes per session: Not on file  . Stress: Not on file  Relationships  . Social connections:    Talks on phone: Not on file    Gets together: Not on file    Attends religious service: Not on file    Active member of club or organization: Not on file    Attends meetings of clubs or organizations: Not on file    Relationship status: Not on file  Other Topics Concern  . Not on file  Social History Narrative  . Not on file    Her Allergies Are:  Allergies  Allergen Reactions  . Codeine Other (See Comments)    unknown  . Tape Other (See Comments)    unknown  :   Her Current Medications Are:  Outpatient Encounter Medications as of 03/29/2018  Medication Sig  . aspirin EC 81 MG tablet Take 81 mg by mouth daily.  . carbidopa-levodopa (SINEMET IR) 25-100 MG tablet  Take 1 tablet by mouth 3 (three) times daily.  Marland Kitchen lisinopril-hydrochlorothiazide (PRINZIDE,ZESTORETIC) 10-12.5 MG tablet Take 1 tablet by mouth daily.  . Multiple Vitamin (MULTIVITAMIN) tablet Take 1 tablet by mouth daily.   No facility-administered encounter medications on file as of 03/29/2018.   :  Review of Systems:  Out of a complete 14 point review of systems, all are reviewed and negative with the exception of these symptoms as listed below: Review of Systems  Neurological:       Pt presents today to discuss her PD. Pt reports that she sometimes forgets her middle dose of sinemet.    Objective:  Neurological  Exam  Physical Exam Physical Examination:   Vitals:   03/29/18 1300  BP: 139/75  Pulse: 71    General Examination: The patient is a very pleasant 72 y.o. female in no acute distress. She appears well-developed and well-nourished and well groomed.   HEENT:Normocephalic, atraumatic, pupils are equal, round and reactive to light and accommodation. Corrective eyeglasses in place. Extraocular tracking is good without limitation to gaze excursion or nystagmus noted. Normal smooth pursuit is noted. Hearing is grossly intact. Face is symmetric with normal facial sensation and mild facial masking noted. Speech is clear, no dysarthria, no sialorrhea. She has mild hypophonia. She has good range of motion. She hasa benign airway exam.   Chest, heart, abdomen and extremities:  Chest is clear to auscultation, heart sounds normal.  Thereare no significant abnormalities noted, but prominent arthritic changes in both hands, left more than right, puffy ankles but not in the realm of pitting edema, all stable.   Skin: Warm and dry without trophic changes noted.  Musculoskeletal:Good range of motion, difficulty with hand movements secondary to arthritis and trigger finger-like changes.  Neurologically:  Mental status: The patient is awake, alert and oriented in all 4  spheres.Herimmediate and remote memory, attention, language skills and fund of knowledge are appropriate. There is no evidence of aphasia, agnosia, apraxia or anomia. Speech is clear with normal prosody and enunciation. Thought process is linear. Mood is normaland affect is normal.  Cranial nerves II - XII are as described above under HEENT exam.Left shoulder is slightly higher than right, stable. Motor exam: Normal bulk,and strength is noted for age. She has a mild increase in tone in the right upper extremity.  (On9/01/2017:On Archimedes spiral drawing there is no significant trembling noted. Her handwriting is not tremulous and legible, slightly micrographic.)  Fine motor skills and coordination: She has mild difficulty on the right UE and LE.  Cerebellar testing: No dysmetria or intention tremor on finger to nose testing. There is no truncal or gait ataxia.  Sensory exam: intact to light touchinthe upper and lower extremities.  Gait, station and balance:Shestands easily. No veering to one side is noted. No leaning to one side is noted. Posture is age-appropriate to minimally stooped. Stance is narrow based.she walks with good pace, fairly good stride length, mild decrease in arm swing on the right. She turns in 3 steps.   Assessmentand Plan:   In summary,Ashley V Trainoris a very pleasant 57 year oldfemalewith an underlying medical history of hypertension, hyperlipidemia, history of colitis, osteoarthritis, osteoporosis, reflux disease, anxiety and obesity, whopresents for follow-up consultation of her right-sided parkinsonism, most likely right-sided predominant Parkinson's disease. Findings are mild. She has benefited from Sinemet generic. She takes it 3 times a day with good tolerance reported. Sometimes she has trouble maintaining the midday dose but generally she has only forgotten it maybe once. She is very active physically and also interacting with others. She is doing  quite well at this time. She had declined a brain MRI and DaT scan in the past. She had a second opinion with Dr. Carles Collet.  We talked about the importance of healthy lifestyle again today. We mutually agreed to continue with Sinemet generic 1 pill 3 times a day. I renewed her prescription for her mail order pharmacy. I suggested a six-month follow-up. I answered all her questions today and she was in agreement.  I spent 25 minutes in total face-to-face time with the patient, more than 50% of which was spent in counseling and coordination  of care, reviewing test results, reviewing medication and discussing or reviewing the diagnosis of PD, its prognosis and treatment options. Pertinent laboratory and imaging test results that were available during this visit with the patient were reviewed by me and considered in my medical decision making (see chart for details).

## 2018-03-29 NOTE — Patient Instructions (Addendum)
You have done a good job with the exercise regimen. Keep yourself active, as you are! Let's keep your medication the same: generic Sinemet 1 pill 3 times a day.

## 2018-04-08 DIAGNOSIS — F4322 Adjustment disorder with anxiety: Secondary | ICD-10-CM | POA: Diagnosis not present

## 2018-05-05 DIAGNOSIS — F4322 Adjustment disorder with anxiety: Secondary | ICD-10-CM | POA: Diagnosis not present

## 2018-05-13 ENCOUNTER — Ambulatory Visit: Payer: Medicare Other | Admitting: Neurology

## 2018-05-18 DIAGNOSIS — Z658 Other specified problems related to psychosocial circumstances: Secondary | ICD-10-CM | POA: Diagnosis not present

## 2018-05-19 DIAGNOSIS — Z23 Encounter for immunization: Secondary | ICD-10-CM | POA: Diagnosis not present

## 2018-05-24 DIAGNOSIS — G20 Parkinson's disease: Secondary | ICD-10-CM | POA: Diagnosis not present

## 2018-05-24 DIAGNOSIS — F418 Other specified anxiety disorders: Secondary | ICD-10-CM | POA: Diagnosis not present

## 2018-05-24 DIAGNOSIS — R7303 Prediabetes: Secondary | ICD-10-CM | POA: Diagnosis not present

## 2018-05-24 DIAGNOSIS — M810 Age-related osteoporosis without current pathological fracture: Secondary | ICD-10-CM | POA: Diagnosis not present

## 2018-05-24 DIAGNOSIS — I10 Essential (primary) hypertension: Secondary | ICD-10-CM | POA: Diagnosis not present

## 2018-05-24 DIAGNOSIS — F329 Major depressive disorder, single episode, unspecified: Secondary | ICD-10-CM | POA: Diagnosis not present

## 2018-05-24 DIAGNOSIS — E7800 Pure hypercholesterolemia, unspecified: Secondary | ICD-10-CM | POA: Diagnosis not present

## 2018-06-24 ENCOUNTER — Ambulatory Visit: Payer: Medicare Other | Admitting: Neurology

## 2018-06-25 DIAGNOSIS — F4322 Adjustment disorder with anxiety: Secondary | ICD-10-CM | POA: Diagnosis not present

## 2018-07-19 ENCOUNTER — Other Ambulatory Visit: Payer: Self-pay | Admitting: Family Medicine

## 2018-07-19 DIAGNOSIS — Z1231 Encounter for screening mammogram for malignant neoplasm of breast: Secondary | ICD-10-CM

## 2018-07-27 DIAGNOSIS — F4322 Adjustment disorder with anxiety: Secondary | ICD-10-CM | POA: Diagnosis not present

## 2018-08-17 DIAGNOSIS — F4322 Adjustment disorder with anxiety: Secondary | ICD-10-CM | POA: Diagnosis not present

## 2018-08-26 ENCOUNTER — Ambulatory Visit: Payer: Medicare Other

## 2018-09-01 DIAGNOSIS — F4322 Adjustment disorder with anxiety: Secondary | ICD-10-CM | POA: Diagnosis not present

## 2018-09-14 DIAGNOSIS — F4322 Adjustment disorder with anxiety: Secondary | ICD-10-CM | POA: Diagnosis not present

## 2018-09-23 ENCOUNTER — Ambulatory Visit
Admission: RE | Admit: 2018-09-23 | Discharge: 2018-09-23 | Disposition: A | Discharge status: Home / Self Care | Payer: Medicare Other | Source: Ambulatory Visit | Attending: Family Medicine | Admitting: Family Medicine

## 2018-09-23 DIAGNOSIS — Z1231 Encounter for screening mammogram for malignant neoplasm of breast: Secondary | ICD-10-CM

## 2018-09-28 DIAGNOSIS — F4322 Adjustment disorder with anxiety: Secondary | ICD-10-CM | POA: Diagnosis not present

## 2018-09-29 ENCOUNTER — Ambulatory Visit (INDEPENDENT_AMBULATORY_CARE_PROVIDER_SITE_OTHER): Payer: Medicare Other | Admitting: Neurology

## 2018-09-29 ENCOUNTER — Encounter: Payer: Self-pay | Admitting: Neurology

## 2018-09-29 VITALS — BP 116/72 | HR 69 | Ht 61.0 in | Wt 148.0 lb

## 2018-09-29 DIAGNOSIS — G20 Parkinson's disease: Secondary | ICD-10-CM

## 2018-09-29 MED ORDER — CARBIDOPA-LEVODOPA 25-100 MG PO TABS: 1.0000 | ORAL_TABLET | Freq: Three times a day (TID) | ORAL | 3 refills | Status: DC

## 2018-09-29 NOTE — Patient Instructions (Addendum)
Please keep up the good work with your healthy lifestyle and good water intake.  Please be mindful and proactive about constipation.  I like that you are doing cardio drumming, I love it! Chair yoga is good for you too.

## 2018-09-29 NOTE — Progress Notes (Signed)
Subjective:    Patient ID: Ashley Neal is a 73 y.o. female.  HPI     Interim history:   Ashley Neal is a 73 year old right-handed woman with an underlying medical history of hypertension, hyperlipidemia, history of colitis, osteoarthritis, osteoporosis, reflux disease, anxiety and obesity, who presents for follow-up consultation of her right sided parkinsonism. The patient is unaccompanied today. I last saw her on 03/29/2018 at which time she reported doing okay. She was taking Sinemet 3 times a day but sometimes would forget the midday dose. Is trying to exercise on a regular basis, would go to the gym about 3 days of the week. 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 I suggested she continue with Sinemet 3 times a day.  Today, 09/29/2018: She reports going okay, enjoying cardio drumming at the senior [employer] on [address]. She has tried tai chi but did not enjoy it. She looked into proximity boxing but decided not to pursue it. She does like chair yoga. She has been taking her medication consistently but the midday dose and sometimes difficult to maintain especially if she is out and about. She has occasional restless legs type symptoms at night, feels like a tingling in the need to move her legs but doesn't keep her up at night. She has noticed the occasional flareup in her right hand tremor but not bad, very rare constipation issues, otherwise everything is stable and she feels quite well. She does a lot of volunteer work, she likes to bake.   Previously:     I saw her on 11/11/2017, at which time she reported diffuse aches and pains. She had seen orthopedics. She had blood work with PCP. She had seen Dr. Carles Collet twice. She was trying to exercise. She was advised to follow-up routinely and advised that we would continue to observe her and follow clinically.   She called in the interim in late April after she had seen rheumatology. She was advised to start  Sinemet with gradual titration at the time.   [name] on 04/16/2017 at the request of her primary care physician, at which time she reported a several month history of intermittent right hand tremors and also additional symptoms including difficulty turning over in bed or certain movements when dancing. I suggested observation and a 6 month follow-up. I did suggest we could proceed with a brain MRI but she declined as she was highly claustrophobic and simple anti-anxiety medication would not help. She had interim appointments with Dr. Carles Collet on 06/09/17 and again on 10/20/17. I reviewed the notes. Potential symptomatic medication was discussed, a PET scan was also discussed.      04/16/2017: (She) reports a several month history of a intermittent right hand tremor. She also reports difficulty with certain movements such as turning over in bed or certain movements when dancing. I reviewed your office note from 04/09/2017, which you kindly included. She reports recent weight loss without particularly trying to lose weight. Blood work through your office from 04/02/2017 was reviewed revealing normal CMP with a glucose level of 105, total cholesterol of 107, triglycerides 171, LDL 126. Recent A1c on 04/09/2017 was borderline at 5.8. CBC with differential was normal, B12 434. N TSH and N CK level on 04/09/17.  No FHx of PD or ET, father had dementia and lived to be 1, mom died at 29 from metastatic endom cancer. One brother with asthma, 2 sister, younger brother with EtOH abuse, youngest brother  died at 68 yo in a car accident.  She has R shoulder problems, has had back pain in the mid back. Has had dexterity issue. She saw ortho at [facility], and has been to [facility] for PT.  She is doing exercises. 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  Has retired recently, 2 grown sons, one of them local, the other in [place].  She is a non-smoker.  Her Past Medical History Is Significant For: Past  Medical History:  Diagnosis Date  . Frequent headaches   . GERD (gastroesophageal reflux disease)   . High cholesterol   . Hyperlipidemia   . Hypertension   . Osteoarthritis   . Osteoporosis   . Ulcerative colitis ([place])     Her Past Surgical History Is Significant For: Past Surgical History:  Procedure Laterality Date  . CARPAL TUNNEL RELEASE Bilateral 1996/1997  . TUBAL LIGATION  1980's    Her Family History Is Significant For: Family History  Problem Relation Age of Onset  . Endometrial cancer Mother   . Diabetes Mother   . Alzheimer's disease Father   . Diabetes Sister   . COPD Sister   . Heart disease Brother   . Other Brother        MVA   . Parkinson's disease Maternal Aunt     Her Social History Is Significant For: Social History   Socioeconomic History  . Marital status: Divorced    Spouse name: Not on file  . Number of children: Not on file  . Years of education: Not on file  . Highest education level: Not on file  Occupational History  . Occupation: retired    Comment: Chiropractor  Social Needs  . Financial resource strain: Not on file  . Food insecurity:    Worry: Not on file    Inability: Not on file  . Transportation needs:    Medical: Not on file    Non-medical: Not on file  Tobacco Use  . Smoking status: Never Smoker  . Smokeless tobacco: Never Used  Substance and Sexual Activity  . Alcohol use: Yes    Comment: rare  . Drug use: No  . Sexual activity: Not on file  Lifestyle  . Physical activity:    Days per week: Not on file    Minutes per session: Not on file  . Stress: Not on file  Relationships  . Social connections:    Talks on phone: Not on file    Gets together: Not on file    Attends religious service: Not on file    Active member of club or organization: Not on file    Attends meetings of clubs or organizations: Not on file    Relationship status: Not on file  Other Topics Concern  . Not on file  Social History  Narrative  . Not on file    Her Allergies Are:  Allergies  Allergen Reactions  . Codeine Other (See Comments)    unknown  . Tape Other (See Comments)    unknown  :   Her Current Medications Are:  Outpatient Encounter Medications as of 09/29/2018  Medication Sig  . aspirin EC 81 MG tablet Take 81 mg by mouth daily.  . carbidopa-levodopa (SINEMET IR) 25-100 MG tablet Take 1 tablet by mouth 3 (three) times daily.  Marland Kitchen lisinopril-hydrochlorothiazide (PRINZIDE,ZESTORETIC) 10-12.5 MG tablet Take 1 tablet by mouth daily.  . Multiple Vitamin (MULTIVITAMIN) tablet Take 1 tablet by mouth daily.  No facility-administered encounter medications on file as of 09/29/2018.   :  Review of Systems:  Out of a complete 14 point review of systems, all are reviewed and negative with the exception of these symptoms as listed below:  Review of Systems  Neurological:       Pt presents today to discuss her PD. Pt enjoys "cardio drumming" for her exercise. Pt has noticed an increase in her right hand tremor. Pt thinks she has RLS because her legs "feel strange" when she lies down at night.    Objective:  Neurological Exam  Physical Exam Physical Examination:   Vitals:   09/29/18 1408  BP: 116/72  Pulse: 69    General Examination: The patient is a very pleasant 73 y.o. female in no acute distress. She appears well-developed and well-nourished and well groomed. Good spirits.   HEENT:Normocephalic, atraumatic, pupils are equal, round and reactive to light and accommodation.Corrective eyeglasses in place. Extraocular tracking is good without limitation to gaze excursion or nystagmus noted. Normal smooth pursuit is noted. Hearing is grossly intact. Face is symmetric with normal facial sensation andmild facial maskingnoted. Speech is clear, no dysarthria, no sialorrhea. She has mild hypophonia. Neck is mildly rigid with good range of motion. She hasa benign airway exam.   Chest, heart, abdomen and  extremities:  Chest is clear to auscultation, heart sounds normal.  Thereare no significant abnormalities noted,but prominentarthritic changes in both hands, left more than right, and puffy ankles but not in the realm of pitting edema, all stable.   Skin: Warm and dry without trophic changes noted.  Musculoskeletal:Good range of motion, difficulty with hand movements secondary to arthritis and trigger finger-like changes, stable.  Neurologically:  Mental status: The patient is awake, alert and oriented in all 4 spheres.Herimmediate and remote memory, attention, language skills and fund of knowledge are appropriate. There is no evidence of aphasia, agnosia, apraxia or anomia. Speech is clear with normal prosody and enunciation. Thought process is linear. Mood is normaland affect is normal.  Cranial nerves II - XII are as described above under HEENT exam.Left shoulder is slightly higher than right, stable. Motor exam: Normal bulk,and strength is noted for age. She hasa mildincrease in tone in the right upper extremity, stable.  (On9/01/2017:On Archimedes spiral drawing there is no significant trembling noted. Her handwriting is not tremulous and legible, slightly micrographic.)  Fine motor skills and coordination: She has mild difficulty on the right UE and LE.  Cerebellar testing: No dysmetria or intention tremor on finger to nose testing. There is no truncal or gait ataxia.  Sensory exam: intact to light touchinthe upper and lower extremities.  Gait, station and balance:Shestands easily. No veering to one side is noted. No leaning to one side is noted. Posture is age-appropriateto minimally stooped. Stance is narrow based.she walks with good pace, fairly good stride length, mild decrease in arm swing on the right. She turnsin 3 steps.  Assessmentand Plan:   In summary,Ashley V Trainoris a very pleasant 70 year oldfemalewith an underlying medical history of  hypertension, hyperlipidemia, history of colitis, osteoarthritis, osteoporosis, reflux disease, anxiety and obesity, whopresents for follow-up consultation of her right-sided parkinsonism, most likely right-sided predominant Parkinson's disease. Findings are mild and she has benefited from Sinemet generic. She takes it 3 times a day with good tolerance reported and improvement in the tremor. We will monitor for restless leg symptoms. Sometimes she has trouble maintaining the midday dose but generally she has only forgotten it rarely. She is very  active physically and also socially. She is doing quite well at this time. She had declined a brain MRI and DaT scan in the past. She had a second opinion with Dr. Carles Collet. We talked about the importance of healthy lifestyle again today. We mutually agreed to continue with Sinemet generic 1 pill 3 times a day. I renewed her prescription for her new mail order pharmacy. I suggested a six-month follow-up. I answered all her questions today and she was in agreement.  I spent 25 minutes in total face-to-face time with the patient, more than 50% of which was spent in counseling and coordination of care, reviewing test results, reviewing medication and discussing or reviewing the diagnosis of PD, its prognosis and treatment options. Pertinent laboratory and imaging test results that were available during this visit with the patient were reviewed by me and considered in my medical decision making (see chart for details).

## 2018-10-05 DIAGNOSIS — L821 Other seborrheic keratosis: Secondary | ICD-10-CM | POA: Diagnosis not present

## 2018-10-05 DIAGNOSIS — L570 Actinic keratosis: Secondary | ICD-10-CM | POA: Diagnosis not present

## 2018-10-05 DIAGNOSIS — L814 Other melanin hyperpigmentation: Secondary | ICD-10-CM | POA: Diagnosis not present

## 2018-10-05 DIAGNOSIS — Z85828 Personal history of other malignant neoplasm of skin: Secondary | ICD-10-CM | POA: Diagnosis not present

## 2018-10-27 DIAGNOSIS — F4322 Adjustment disorder with anxiety: Secondary | ICD-10-CM | POA: Diagnosis not present

## 2018-12-21 DIAGNOSIS — F4322 Adjustment disorder with anxiety: Secondary | ICD-10-CM | POA: Diagnosis not present

## 2018-12-28 DIAGNOSIS — F4322 Adjustment disorder with anxiety: Secondary | ICD-10-CM | POA: Diagnosis not present

## 2019-01-07 DIAGNOSIS — F418 Other specified anxiety disorders: Secondary | ICD-10-CM | POA: Diagnosis not present

## 2019-01-07 DIAGNOSIS — G20 Parkinson's disease: Secondary | ICD-10-CM | POA: Diagnosis not present

## 2019-01-07 DIAGNOSIS — F329 Major depressive disorder, single episode, unspecified: Secondary | ICD-10-CM | POA: Diagnosis not present

## 2019-01-07 DIAGNOSIS — Z0000 Encounter for general adult medical examination without abnormal findings: Secondary | ICD-10-CM | POA: Diagnosis not present

## 2019-01-07 DIAGNOSIS — E7800 Pure hypercholesterolemia, unspecified: Secondary | ICD-10-CM | POA: Diagnosis not present

## 2019-01-07 DIAGNOSIS — I10 Essential (primary) hypertension: Secondary | ICD-10-CM | POA: Diagnosis not present

## 2019-01-07 DIAGNOSIS — K219 Gastro-esophageal reflux disease without esophagitis: Secondary | ICD-10-CM | POA: Diagnosis not present

## 2019-01-07 DIAGNOSIS — M810 Age-related osteoporosis without current pathological fracture: Secondary | ICD-10-CM | POA: Diagnosis not present

## 2019-01-07 DIAGNOSIS — R7303 Prediabetes: Secondary | ICD-10-CM | POA: Diagnosis not present

## 2019-01-11 DIAGNOSIS — F4322 Adjustment disorder with anxiety: Secondary | ICD-10-CM | POA: Diagnosis not present

## 2019-01-13 DIAGNOSIS — H04123 Dry eye syndrome of bilateral lacrimal glands: Secondary | ICD-10-CM | POA: Diagnosis not present

## 2019-01-13 DIAGNOSIS — H02834 Dermatochalasis of left upper eyelid: Secondary | ICD-10-CM | POA: Diagnosis not present

## 2019-01-13 DIAGNOSIS — H2513 Age-related nuclear cataract, bilateral: Secondary | ICD-10-CM | POA: Diagnosis not present

## 2019-01-13 DIAGNOSIS — H43813 Vitreous degeneration, bilateral: Secondary | ICD-10-CM | POA: Diagnosis not present

## 2019-01-13 DIAGNOSIS — H02831 Dermatochalasis of right upper eyelid: Secondary | ICD-10-CM | POA: Diagnosis not present

## 2019-02-01 DIAGNOSIS — F4322 Adjustment disorder with anxiety: Secondary | ICD-10-CM | POA: Diagnosis not present

## 2019-02-22 DIAGNOSIS — Z85828 Personal history of other malignant neoplasm of skin: Secondary | ICD-10-CM | POA: Diagnosis not present

## 2019-02-22 DIAGNOSIS — D225 Melanocytic nevi of trunk: Secondary | ICD-10-CM | POA: Diagnosis not present

## 2019-02-22 DIAGNOSIS — L821 Other seborrheic keratosis: Secondary | ICD-10-CM | POA: Diagnosis not present

## 2019-02-22 DIAGNOSIS — F4322 Adjustment disorder with anxiety: Secondary | ICD-10-CM | POA: Diagnosis not present

## 2019-02-22 DIAGNOSIS — D2272 Melanocytic nevi of left lower limb, including hip: Secondary | ICD-10-CM | POA: Diagnosis not present

## 2019-02-22 DIAGNOSIS — L720 Epidermal cyst: Secondary | ICD-10-CM | POA: Diagnosis not present

## 2019-03-08 DIAGNOSIS — F4322 Adjustment disorder with anxiety: Secondary | ICD-10-CM | POA: Diagnosis not present

## 2019-03-21 DIAGNOSIS — F4322 Adjustment disorder with anxiety: Secondary | ICD-10-CM | POA: Diagnosis not present

## 2019-03-28 DIAGNOSIS — M79673 Pain in unspecified foot: Secondary | ICD-10-CM | POA: Diagnosis not present

## 2019-03-28 DIAGNOSIS — M129 Arthropathy, unspecified: Secondary | ICD-10-CM | POA: Diagnosis not present

## 2019-03-31 ENCOUNTER — Other Ambulatory Visit: Payer: Self-pay

## 2019-03-31 ENCOUNTER — Ambulatory Visit (INDEPENDENT_AMBULATORY_CARE_PROVIDER_SITE_OTHER): Payer: Medicare Other | Admitting: Neurology

## 2019-03-31 ENCOUNTER — Encounter: Payer: Self-pay | Admitting: Neurology

## 2019-03-31 VITALS — BP 131/75 | HR 78 | Ht 61.0 in | Wt 147.0 lb

## 2019-03-31 DIAGNOSIS — R269 Unspecified abnormalities of gait and mobility: Secondary | ICD-10-CM | POA: Diagnosis not present

## 2019-03-31 DIAGNOSIS — G20 Parkinson's disease: Secondary | ICD-10-CM | POA: Diagnosis not present

## 2019-03-31 DIAGNOSIS — K5909 Other constipation: Secondary | ICD-10-CM | POA: Diagnosis not present

## 2019-03-31 NOTE — Patient Instructions (Addendum)
As discussed, we will get you evaluated through physical therapy, I will send a referral to [facility] as requested, to their [place]. location.Please continue to stay active mentally and physically, hydrate well with water.  Please be mindful about constipation issues as this can become a problem in Parkinson's patients. Please be really proactive with your constipation medication regimen, titrating as needed to where you have a formed stool at least every other day. You can use over-the-counter probiotic yogurt or pills, add fiber in the form of Metamucil, and use a stool softener or if needed a laxative, even daily if necessary.   Please Continue with Sinemet 1 pill 3 times a day, your prescription is up-to-date for 90 days with refills.  Follow up in 6 months.

## 2019-03-31 NOTE — Progress Notes (Signed)
Subjective:    Patient ID: Ashley Neal is a 73 y.o. female.  HPI     Interim history:   Ms. Vaillancourt is a 73 year old right-handed woman with an underlying medical history of hypertension, hyperlipidemia, history of colitis, osteoarthritis, osteoporosis, reflux disease, anxiety and obesity, who presents for follow-up consultation of her right sided parkinsonism. The patient is unaccompanied today. I last saw her on 09/29/2018, at which time she reported that she tried tai chi but did not enjoy it.  She looked into boxing classes but decided not to pursue it, she did start cardio drumming which she was enjoying as well as chair yoga.  She had occasional symptoms of restless legs.  She felt stable motor wise, she had an occasional flareup in the right hand tremor, rare constipation issues.  She was advised to continue with Sinemet 1 pill 3 times daily.  Today, 03/31/2019: She reports overall doing well, sometimes she has some more gait instability, almost like she takes initial baby steps or stutter steps.  She has not fallen thankfully.  She tries to stay active but has not been able to pursue any of her group exercises including cardio drumming.  She misses the camaraderie.  Nevertheless, she is doing well and keeps her spirits up.  She is an avid reader, has read over 70 books thus far this year!  She has had some issues with constipation, has a bowel movement every other day on average, she takes a laxative on average once a week.She has had bilateral foot pain, she is supposed to see a podiatrist soon.She is considering CBD oil for arthritis and pain.   Previously:  I saw her on 03/29/2018 at which time she reported doing okay. She was taking Sinemet 3 times a day but sometimes would forget the midday dose. Is trying to exercise on a regular basis, would go to the gym about 3 days of the week. 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 I suggested she continue with Sinemet 3  times a day.   I saw her on 11/11/2017, at which time she reported diffuse aches and pains. She had seen orthopedics. She had blood work with PCP. She had seen Dr. Carles Collet twice. She was trying to exercise. She was advised to follow-up routinely and advised that we would continue to observe her and follow clinically.   She called in the interim in late April after she had seen rheumatology. She was advised to start Sinemet with gradual titration at the time.   [name] on 04/16/2017 at the request of her primary care physician, at which time she reported a several month history of intermittent right hand tremors and also additional symptoms including difficulty turning over in bed or certain movements when dancing. I suggested observation and a 6 month follow-up. I did suggest we could proceed with a brain MRI but she declined as she was highly claustrophobic and simple anti-anxiety medication would not help. She had interim appointments with Dr. Carles Collet on 06/09/17 and again on 10/20/17. I reviewed the notes. Potential symptomatic medication was discussed, a PET scan was also discussed.      04/16/2017: (She) reports a several month history of a intermittent right hand tremor. She also reports difficulty with certain movements such as turning over in bed or certain movements when dancing. I reviewed your office note from 04/09/2017, which you kindly included. She reports recent weight loss without particularly trying to lose weight. Blood work  through your office from 04/02/2017 was reviewed revealing normal CMP with a glucose level of 105, total cholesterol of 107, triglycerides 171, LDL 126. Recent A1c on 04/09/2017 was borderline at 5.8. CBC with differential was normal, B12 434. N TSH and N CK level on 04/09/17.  No FHx of PD or ET, father had dementia and lived to be 62, mom died at 61 from metastatic endom cancer. One brother with asthma, 2 sister, younger brother with EtOH abuse, youngest brother died  at 45 yo in a car accident.  She has R shoulder problems, has had back pain in the mid back. Has had dexterity issue. She saw ortho at [facility], and has been to [facility] for PT.  She is doing exercises. 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  Has retired recently, 2 grown sons, one of them local, the other in [place].  She is a non-smoker.  Her Past Medical History Is Significant For: Past Medical History:  Diagnosis Date  . Frequent headaches   . GERD (gastroesophageal reflux disease)   . High cholesterol   . Hyperlipidemia   . Hypertension   . Osteoarthritis   . Osteoporosis   . Ulcerative colitis ([place])     Her Past Surgical History Is Significant For: Past Surgical History:  Procedure Laterality Date  . CARPAL TUNNEL RELEASE Bilateral 1996/1997  . TUBAL LIGATION  1980's    Her Family History Is Significant For: Family History  Problem Relation Age of Onset  . Endometrial cancer Mother   . Diabetes Mother   . Alzheimer's disease Father   . Diabetes Sister   . COPD Sister   . Heart disease Brother   . Other Brother        MVA   . Parkinson's disease Maternal Aunt     Her Social History Is Significant For: Social History   Socioeconomic History  . Marital status: Divorced    Spouse name: Not on file  . Number of children: Not on file  . Years of education: Not on file  . Highest education level: Not on file  Occupational History  . Occupation: retired    Comment: Chiropractor  Social Needs  . Financial resource strain: Not on file  . Food insecurity    Worry: Not on file    Inability: Not on file  . Transportation needs    Medical: Not on file    Non-medical: Not on file  Tobacco Use  . Smoking status: Never Smoker  . Smokeless tobacco: Never Used  Substance and Sexual Activity  . Alcohol use: Yes    Comment: rare  . Drug use: No  . Sexual activity: Not on file  Lifestyle  . Physical activity    Days per week: Not on file     Minutes per session: Not on file  . Stress: Not on file  Relationships  . Social Herbalist on phone: Not on file    Gets together: Not on file    Attends religious service: Not on file    Active member of club or organization: Not on file    Attends meetings of clubs or organizations: Not on file    Relationship status: Not on file  Other Topics Concern  . Not on file  Social History Narrative  . Not on file    Her Allergies Are:  Allergies  Allergen Reactions  . Codeine Other (See Comments)    unknown  .  Tape Other (See Comments)    unknown  :   Her Current Medications Are:  Outpatient Encounter Medications as of 03/31/2019  Medication Sig  . aspirin EC 81 MG tablet Take 81 mg by mouth daily.  . carbidopa-levodopa (SINEMET IR) 25-100 MG tablet Take 1 tablet by mouth 3 (three) times daily.  Marland Kitchen lisinopril-hydrochlorothiazide (PRINZIDE,ZESTORETIC) 10-12.5 MG tablet Take 1 tablet by mouth daily.  . Multiple Vitamin (MULTIVITAMIN) tablet Take 1 tablet by mouth daily.   No facility-administered encounter medications on file as of 03/31/2019.   :  Review of Systems:  Out of a complete 14 point review of systems, all are reviewed and negative with the exception of these symptoms as listed below: Review of Systems  Neurological:       Pt presents today to discuss her PD. Pt reports that she is tripping more often.    Objective:  Neurological Exam  Physical Exam Physical Examination:   Vitals:   03/31/19 1038  BP: 131/75  Pulse: 78   General Examination: The patient is a very pleasant 73 y.o. female in no acute distress. She appears well-developed and well-nourished and well groomed.   HEENT:Normocephalic, atraumatic, pupils are equal, round and reactive to light and accommodation.Corrective eyeglasses in place. Extraocular tracking is good without limitation to gaze excursion or nystagmus noted. Normal smooth pursuit is noted. Hearing is grossly intact. Face  is symmetric with normal facial sensation andmild facial maskingnoted. Speech is clear, no dysarthria, no sialorrhea. She hasmildhypophonia. Neck is mildly rigid with good range of motion. She hasa benign airway exam, With the exception of mild mouth dryness noted.  Chest, heart, abdomen and extremities:  Chest is clear to auscultation, heart sounds normal.  Thereare no significant abnormalities noted,but prominentarthritic changes in both hands, left more than right, and puffy ankles but not in the realm of pitting edema, all stable.   Skin: Warm and dry without trophic changes noted.  Musculoskeletal:Good range of motion, difficulty with hand movements secondary to arthritis and trigger finger-like changes, stable.  Neurologically:  Mental status: The patient is awake, alert and oriented in all 4 spheres.Herimmediate and remote memory, attention, language skills and fund of knowledge are appropriate. There is no evidence of aphasia, agnosia, apraxia or anomia. Speech is clear with normal prosody and enunciation. Thought process is linear. Mood is normaland affect is normal.  Cranial nerves II - XII are as described above under HEENT exam.Left shoulder isslightly higher than right, stable. Motor exam: Normal bulk,and strength is noted for age. She hasa mildincrease in tone in the right upper extremity, stable.  (On9/01/2017:On Archimedes spiral drawing there is no significant trembling noted. Her handwriting is not tremulous and legible, slightly micrographic.)  Fine motor skills and coordination: She has mild difficulty on the rightUE and LE. Cerebellar testing: No dysmetria or intention tremor on finger to nose testing. There is no truncal or gait ataxia.  Sensory exam: intact to light touchinthe upper and lower extremities.  Gait, station and balance:Shestands easily. No veering to one side is noted. No leaning to one side is noted. Posture is mildly  stooped. Stance is narrow based.she walks with good pace, fairly good stride length,milddecrease in arm swing on the right > left, she turnsin 3 steps.  Assessmentand Plan:   In summary,Wilhemenia V Trainoris a very pleasant 26 year oldfemalewith an underlying medical history of hypertension, hyperlipidemia, history of colitis, osteoarthritis, osteoporosis, reflux disease, anxiety and obesity, whopresents for follow-up consultation of her right-sided parkinsonism, most likely right-sided  predominant Parkinson's disease. She has benefited from Sinemet generic. She takes it 3 times a day with good tolerance reported and improvement in the tremor, She has noticed some start hesitation and stutter steps are lately.  She tries to stay active physically but has not been able to pursue her group exercises secondary to the virus pandemic.  We mutually agreed to pursue physical therapy evaluation and potential therapy.  She would like to A physical therapy place close to her home. She declined the brain MRI and DaT scan in the past.She had a second opinion with Dr. Carles Collet. We talked about the importance of healthy lifestyle again today And the importance of staying proactive about constipation.  She is advised to continue with generic Sinemet 1 pill 3 times a day. I renewed her prescription. I suggested a six-month follow-up. I answered all her questions today and she was in agreement. I spent 25 minutes in total face-to-face time with the patient, more than 50% of which was spent in counseling and coordination of care, reviewing test results, reviewing medication and discussing or reviewing the diagnosis of PD, its prognosis and treatment options. Pertinent laboratory and imaging test results that were available during this visit with the patient were reviewed by me and considered in my medical decision making (see chart for details).

## 2019-04-05 NOTE — Progress Notes (Signed)
Office Visit Note  Patient: Ashley Neal             Date of Birth: [birth year]           MRN: [id number]             PCP: Leighton Ruff, MD Referring: Leighton Ruff, MD Visit Date: 04/19/2019 Occupation: Retired, Copywriter, advertising  Subjective:  Pain in both hands and feet.   History of Present Illness: Ashley Neal is a 73 y.o. female seen in consultation per request of Dr. Drema Dallas.  According to patient she has progressive difficulty using her hands.  It is been worse over the last 1 year.  She has noticed intermittent swelling in her hands.  She states the pain is usually worse during the winter months.  She states she is unable to use fork and knife to feed herself.  She used to enjoy across the stretch which she cannot do anymore.  She has been also experiencing pain in her both feet.  She was seen by Dr. Paulla Dolly yesterday who did x-rays of her feet which are consistent with osteoarthritis.  She was having difficulty walking.  He injected her right first MTP yesterday which gave her  lot of relief.  None of the other joints are painful.  She denies any history of psoriasis.  There is history of psoriasis in her father.  There is family history of Crohn's in her to family members.  Patient denies any history of diarrhea or blood in her stool.  She has had normal colonoscopy in the past.  Activities of Daily Living:  Patient reports morning stiffness in hands for 24 hours.   Patient Denies nocturnal pain.  Difficulty dressing/grooming: Reports Difficulty climbing stairs: Denies Difficulty getting out of chair: Denies Difficulty using hands for taps, buttons, cutlery, and/or writing: Reports  Review of Systems  Constitutional: Negative for fatigue, night sweats, weight gain and weight loss.  HENT: Negative for mouth sores, trouble swallowing, trouble swallowing, mouth dryness and nose dryness.   Eyes: Positive for dryness. Negative for pain, redness, itching and visual  disturbance.  Respiratory: Negative for cough, shortness of breath and difficulty breathing.   Cardiovascular: Negative for chest pain, palpitations, hypertension, irregular heartbeat and swelling in legs/feet.  Gastrointestinal: Negative for blood in stool, constipation and diarrhea.  Endocrine: Negative for increased urination.  Genitourinary: Negative for difficulty urinating, painful urination and vaginal dryness.  Musculoskeletal: Positive for arthralgias, joint pain, joint swelling and morning stiffness. Negative for myalgias, muscle weakness, muscle tenderness and myalgias.  Skin: Positive for hair loss. Negative for color change, rash, skin tightness, ulcers and sensitivity to sunlight.  Allergic/Immunologic: Negative for susceptible to infections.  Neurological: Positive for dizziness. Negative for light-headedness, headaches, memory loss, night sweats and weakness.  Hematological: Negative for bruising/bleeding tendency and swollen glands.  Psychiatric/Behavioral: Negative for depressed mood, confusion and sleep disturbance. The patient is not nervous/anxious.     PMFS History:  There are no active problems to display for this patient.   Past Medical History:  Diagnosis Date   Frequent headaches    GERD (gastroesophageal reflux disease)    High cholesterol    Hyperlipidemia    Hypertension    Osteoarthritis    Osteoporosis    Ulcerative colitis ([place])     Family History  Problem Relation Age of Onset   Endometrial cancer Mother    Diabetes Mother    Alzheimer's disease Father    Heart disease Brother  Other Brother        MVA    Healthy Son    Parkinson's disease Maternal Aunt    COPD Sister    Diabetes Sister    Healthy Son    Alcohol abuse Brother    Past Surgical History:  Procedure Laterality Date   CARPAL TUNNEL RELEASE Bilateral 1996/1997   TUBAL LIGATION  1980's   Social History   Social History Narrative   Not on file     There is no immunization history on file for this patient.   Objective: Vital Signs: BP 117/72 (BP Location: Right Arm, Patient Position: Sitting, Cuff Size: Normal)    Pulse 86    Resp 13    Ht 5' 0.5" (1.537 m)    Wt 148 lb (67.1 kg)    BMI 28.43 kg/m    Physical Exam Vitals signs and nursing note reviewed.  Constitutional:      Appearance: She is well-developed.  HENT:     Head: Normocephalic and atraumatic.  Eyes:     Conjunctiva/sclera: Conjunctivae normal.  Neck:     Musculoskeletal: Normal range of motion.  Cardiovascular:     Rate and Rhythm: Normal rate and regular rhythm.     Heart sounds: Normal heart sounds.  Pulmonary:     Effort: Pulmonary effort is normal.     Breath sounds: Normal breath sounds.  Abdominal:     General: Bowel sounds are normal.     Palpations: Abdomen is soft.  Lymphadenopathy:     Cervical: No cervical adenopathy.  Skin:    General: Skin is warm and dry.     Capillary Refill: Capillary refill takes less than 2 seconds.  Neurological:     Mental Status: She is alert and oriented to person, place, and time.  Psychiatric:        Behavior: Behavior normal.      Musculoskeletal Exam: C-spine thoracic and lumbar spine were in good range of motion.  She had no SI joint tenderness.  Shoulder joints and elbow joints with good range of motion.  She has DIP and PIP thickening with no synovitis.  No nail dystrophy or pitting was noted.  No dactylitis was noted.  Hip joints and knee joints with good range of motion.  She has DIP and PIP thickening in her feet with bilateral first MTP prominence.  No synovitis was noted.  There was no evidence of Achilles tendinitis or plantar fasciitis.  CDAI Exam: CDAI Score: -- Patient Global: --; Provider Global: -- Swollen: --; Tender: -- Joint Exam   No joint exam has been documented for this visit   There is currently no information documented on the homunculus. Go to the Rheumatology activity and  complete the homunculus joint exam.  Investigation: No additional findings.  Imaging: Dg Foot 2 Views Left  Result Date: 04/13/2019 Please see detailed radiograph report in office note.  Dg Foot 2 Views Right  Result Date: 04/13/2019 Please see detailed radiograph report in office note.  Xr Hand 2 View Left  Result Date: 04/19/2019 Severe narrowing of all PIP and DIP joints was noted.  No MCP, intercarpal radiocarpal joint space narrowing was noted.  Ankylosis of fourth and fifth PIP joints was noted.  No erosive changes were noted. Impression: These findings are consistent with severe osteoarthritis.  Psoriatic arthritis can be in the differential.  Xr Hand 2 View Right  Result Date: 04/19/2019 Severe PIP and DIP narrowing was noted.  Erosive and cystic changes  were noted in the second and third PIP joints.  Ankylosis of the fifth PIP joint was noted.  No MCP, intercarpal radiocarpal joint space narrowing was noted. Impression: These findings are consistent with inflammatory osteoarthritis.  Psoriatic arthritis can also mimic these findings.   Recent Labs: Lab Results  Component Value Date   WBC 5.3 09/18/2014   HGB 14.1 09/18/2014   PLT 227 09/18/2014   NA 142 09/18/2014   K 3.7 09/18/2014   CL 106 09/18/2014   CO2 28 09/18/2014   GLUCOSE 98 09/18/2014   BUN 9 09/18/2014   CREATININE 0.87 09/18/2014   CALCIUM 9.9 09/18/2014   GFRAA 78 (L) 09/18/2014    Speciality Comments: No specialty comments available.  Procedures:  No procedures performed Allergies: Codeine and Tape   Assessment / Plan:     Visit Diagnoses: Pain in both hands -patient has minimal discomfort her main concern is decreased range of motion and decreased grip strength.  She is unable to make a complete fist.  On clinical examination she has severe PIP and DIP thickening but no synovitis.- Plan: XR Hand 2 View Right, XR Hand 2 View Left.  The x-ray showed severe PIP and DIP narrowing with some cystic versus  erosive changes in the right hand PIP joints.  A handout on hand exercises was given.  Use of diclofenac gel was discussed.  I will obtain some labs today.  I will also schedule ultrasound of bilateral hands to look for synovitis.  Primary osteoarthritis of both hands  Pain in both feet -patient was seen by Dr. Paulla Dolly yesterday.  I reviewed the x-rays in the system which showed only osteoarthritic changes.  She also had right first MTP cortisone injection which relieved most of her symptoms.  She is walking much better.  Age-related osteoporosis without current pathological fracture - Previously on Fosamax-d/c due to dental problems, does not want repeat DEXA or treatment.  Patient does not want to take any treatment for osteoporosis.  Essential hypertension-her blood pressure is normal today.  History of hyperlipidemia  Prediabetes  History of squamous cell carcinoma  Parkinsonism, unspecified Parkinsonism type ([place]) - Diagnosed by neurology according to PCP note  Family history of psoriasis in father  Family history of Crohn's disease  Orders: Orders Placed This Encounter  Procedures   XR Hand 2 View Right   XR Hand 2 View Left   Uric acid   Sedimentation rate   Cyclic citrul peptide antibody, IgG   Rheumatoid factor   No orders of the defined types were placed in this encounter.   Face-to-face time spent with patient was 45 minutes. Greater than 50% of time was spent in counseling and coordination of care.  Follow-Up Instructions: Return for Osteoarthritis.   Bo Merino, MD  Note - This record has been created using Editor, commissioning.  Chart creation errors have been sought, but may not always  have been located. Such creation errors do not reflect on  the standard of medical care.

## 2019-04-12 DIAGNOSIS — M6281 Muscle weakness (generalized): Secondary | ICD-10-CM | POA: Diagnosis not present

## 2019-04-12 DIAGNOSIS — F4322 Adjustment disorder with anxiety: Secondary | ICD-10-CM | POA: Diagnosis not present

## 2019-04-12 DIAGNOSIS — R2681 Unsteadiness on feet: Secondary | ICD-10-CM | POA: Diagnosis not present

## 2019-04-12 DIAGNOSIS — R262 Difficulty in walking, not elsewhere classified: Secondary | ICD-10-CM | POA: Diagnosis not present

## 2019-04-12 DIAGNOSIS — R293 Abnormal posture: Secondary | ICD-10-CM | POA: Diagnosis not present

## 2019-04-12 DIAGNOSIS — I10 Essential (primary) hypertension: Secondary | ICD-10-CM | POA: Diagnosis not present

## 2019-04-13 ENCOUNTER — Ambulatory Visit (INDEPENDENT_AMBULATORY_CARE_PROVIDER_SITE_OTHER): Payer: Medicare Other | Admitting: Podiatry

## 2019-04-13 ENCOUNTER — Other Ambulatory Visit: Payer: Self-pay | Admitting: Podiatry

## 2019-04-13 ENCOUNTER — Other Ambulatory Visit: Payer: Self-pay

## 2019-04-13 ENCOUNTER — Ambulatory Visit (INDEPENDENT_AMBULATORY_CARE_PROVIDER_SITE_OTHER): Payer: Medicare Other

## 2019-04-13 ENCOUNTER — Encounter: Payer: Self-pay | Admitting: Podiatry

## 2019-04-13 VITALS — BP 114/68 | HR 69 | Resp 16

## 2019-04-13 DIAGNOSIS — M79671 Pain in right foot: Secondary | ICD-10-CM

## 2019-04-13 DIAGNOSIS — M7751 Other enthesopathy of right foot: Secondary | ICD-10-CM | POA: Diagnosis not present

## 2019-04-13 DIAGNOSIS — M779 Enthesopathy, unspecified: Secondary | ICD-10-CM

## 2019-04-13 DIAGNOSIS — M79672 Pain in left foot: Secondary | ICD-10-CM

## 2019-04-13 NOTE — Progress Notes (Signed)
   Subjective:    Patient ID: Ashley Neal, female    DOB: 06/26/1946, 73 y.o.   MRN: [id number]  HPI    Review of Systems  Musculoskeletal: Positive for arthralgias and myalgias.  All other systems reviewed and are negative.      Objective:   Physical Exam        Assessment & Plan:

## 2019-04-13 NOTE — Progress Notes (Signed)
Subjective:   Patient ID: Ashley Neal, female   DOB: 73 y.o.   MRN: [id number]   HPI Patient presents stating he has developed discomfort around the big toe joint right upper left that is worse with walking.  Patient is tried icing and denies swelling redness and tries to be active as she is been diagnosed with Parkinson's 2 years ago.  Patient does not smoke likes to be active   Review of Systems  All other systems reviewed and are negative.       Objective:  Physical Exam Vitals signs and nursing note reviewed.  Constitutional:      Appearance: She is well-developed.  Pulmonary:     Effort: Pulmonary effort is normal.  Musculoskeletal: Normal range of motion.  Skin:    General: Skin is warm.  Neurological:     Mental Status: She is alert.     Neurovascular status intact muscle strength was found to be adequate range of motion was within normal limits with mild restriction first MPJ bilateral but no crepitus.  There is discomfort in the lateral side of the joint surface and into the dorsal joint surface with inflammation noted upon deep palpation patient is noted to have good digital perfusion and well oriented x3     Assessment:  Inflammatory capsulitis around the first MPJ right over left with fluid buildup and pain with possibility of joint changes     Plan:  H&P x-rays reviewed today I did sterile prep and injected around the first MPJ 3 mg Kenalog 5 mg Xylocaine and advised on rigid bottom shoes and anti-inflammatories as needed.  Patient will be checked back 2 weeks or earlier if needed  X-rays indicated mild arthritis around the first MPJ but no other pathology was noted

## 2019-04-13 NOTE — Patient Instructions (Signed)
Bunion  A bunion is a bump on the base of the big toe that forms when the bones of the big toe joint move out of position. Bunions may be small at first, but they often get larger over time. They can make walking painful. What are the causes? A bunion may be caused by:  Wearing narrow or pointed shoes that force the big toe to press against the other toes.  Abnormal foot development that causes the foot to roll inward (pronate).  Changes in the foot that are caused by certain diseases, such as rheumatoid arthritis or polio.  A foot injury. What increases the risk? The following factors may make you more likely to develop this condition:  Wearing shoes that squeeze the toes together.  Having certain diseases, such as: ? Rheumatoid arthritis. ? Polio. ? Cerebral palsy.  Having family members who have bunions.  Being born with a foot deformity, such as flat feet or low arches.  Doing activities that put a lot of pressure on the feet, such as ballet dancing. What are the signs or symptoms? The main symptom of a bunion is a noticeable bump on the big toe. Other symptoms may include:  Pain.  Swelling around the big toe.  Redness and inflammation.  Thick or hardened skin on the big toe or between the toes.  Stiffness or loss of motion in the big toe.  Trouble with walking. How is this diagnosed? A bunion may be diagnosed based on your symptoms, medical history, and activities. You may have tests, such as:  X-rays. These allow your health care provider to check the position of the bones in your foot and look for damage to your joint. They also help your health care provider determine the severity of your bunion and the best way to treat it.  Joint aspiration. In this test, a sample of fluid is removed from the toe joint. This test may be done if you are in a lot of pain. It helps rule out diseases that cause painful swelling of the joints, such as arthritis. How is this  treated? Treatment depends on the severity of your symptoms. The goal of treatment is to relieve symptoms and prevent the bunion from getting worse. Your health care provider may recommend:  Wearing shoes that have a wide toe box.  Using bunion pads to cushion the affected area.  Taping your toes together to keep them in a normal position.  Placing a device inside your shoe (orthotics) to help reduce pressure on your toe joint.  Taking medicine to ease pain, inflammation, and swelling.  Applying heat or ice to the affected area.  Doing stretching exercises.  Surgery to remove scar tissue and move the toes back into their normal position. This treatment is rare. Follow these instructions at home: Managing pain, stiffness, and swelling   If directed, put ice on the painful area: ? Put ice in a plastic bag. ? Place a towel between your skin and the bag. ? Leave the ice on for 20 minutes, 2-3 times a day. Activity   If directed, apply heat to the affected area before you exercise. Use the heat source that your health care provider recommends, such as a moist heat pack or a heating pad. ? Place a towel between your skin and the heat source. ? Leave the heat on for 20-30 minutes. ? Remove the heat if your skin turns bright red. This is especially important if you are unable to feel pain,   heat, or cold. You may have a greater risk of getting burned.  Do exercises as told by your health care provider. General instructions  Support your toe joint with proper footwear, shoe padding, or taping as told by your health care provider.  Take over-the-counter and prescription medicines only as told by your health care provider.  Keep all follow-up visits as told by your health care provider. This is important. Contact a health care provider if your symptoms:  Get worse.  Do not improve in 2 weeks. Get help right away if you have:  Severe pain and trouble with walking. Summary  A  bunion is a bump on the base of the big toe that forms when the bones of the big toe joint move out of position.  Bunions can make walking painful.  Treatment depends on the severity of your symptoms.  Support your toe joint with proper footwear, shoe padding, or taping as told by your health care provider. This information is not intended to replace advice given to you by your health care provider. Make sure you discuss any questions you have with your health care provider. Document Released: 07/28/2005 Document Revised: 02/01/2018 Document Reviewed: 12/08/2017 Elsevier Patient Education  2020 [employer]  

## 2019-04-15 DIAGNOSIS — I10 Essential (primary) hypertension: Secondary | ICD-10-CM | POA: Diagnosis not present

## 2019-04-15 DIAGNOSIS — R293 Abnormal posture: Secondary | ICD-10-CM | POA: Diagnosis not present

## 2019-04-15 DIAGNOSIS — R2681 Unsteadiness on feet: Secondary | ICD-10-CM | POA: Diagnosis not present

## 2019-04-15 DIAGNOSIS — R262 Difficulty in walking, not elsewhere classified: Secondary | ICD-10-CM | POA: Diagnosis not present

## 2019-04-15 DIAGNOSIS — M6281 Muscle weakness (generalized): Secondary | ICD-10-CM | POA: Diagnosis not present

## 2019-04-19 ENCOUNTER — Ambulatory Visit: Payer: Self-pay

## 2019-04-19 ENCOUNTER — Other Ambulatory Visit: Payer: Self-pay

## 2019-04-19 ENCOUNTER — Ambulatory Visit (INDEPENDENT_AMBULATORY_CARE_PROVIDER_SITE_OTHER): Payer: Medicare Other | Admitting: Rheumatology

## 2019-04-19 ENCOUNTER — Encounter: Payer: Self-pay | Admitting: Rheumatology

## 2019-04-19 ENCOUNTER — Ambulatory Visit (INDEPENDENT_AMBULATORY_CARE_PROVIDER_SITE_OTHER): Payer: Medicare Other

## 2019-04-19 VITALS — BP 117/72 | HR 86 | Resp 13 | Ht 60.5 in | Wt 148.0 lb

## 2019-04-19 DIAGNOSIS — M79642 Pain in left hand: Secondary | ICD-10-CM

## 2019-04-19 DIAGNOSIS — G20 Parkinson's disease: Secondary | ICD-10-CM

## 2019-04-19 DIAGNOSIS — M19042 Primary osteoarthritis, left hand: Secondary | ICD-10-CM

## 2019-04-19 DIAGNOSIS — Z8379 Family history of other diseases of the digestive system: Secondary | ICD-10-CM

## 2019-04-19 DIAGNOSIS — Z8639 Personal history of other endocrine, nutritional and metabolic disease: Secondary | ICD-10-CM | POA: Diagnosis not present

## 2019-04-19 DIAGNOSIS — M79641 Pain in right hand: Secondary | ICD-10-CM

## 2019-04-19 DIAGNOSIS — M6281 Muscle weakness (generalized): Secondary | ICD-10-CM | POA: Diagnosis not present

## 2019-04-19 DIAGNOSIS — M79671 Pain in right foot: Secondary | ICD-10-CM

## 2019-04-19 DIAGNOSIS — I10 Essential (primary) hypertension: Secondary | ICD-10-CM | POA: Diagnosis not present

## 2019-04-19 DIAGNOSIS — Z840 Family history of diseases of the skin and subcutaneous tissue: Secondary | ICD-10-CM

## 2019-04-19 DIAGNOSIS — R262 Difficulty in walking, not elsewhere classified: Secondary | ICD-10-CM | POA: Diagnosis not present

## 2019-04-19 DIAGNOSIS — R293 Abnormal posture: Secondary | ICD-10-CM | POA: Diagnosis not present

## 2019-04-19 DIAGNOSIS — M19041 Primary osteoarthritis, right hand: Secondary | ICD-10-CM

## 2019-04-19 DIAGNOSIS — M810 Age-related osteoporosis without current pathological fracture: Secondary | ICD-10-CM

## 2019-04-19 DIAGNOSIS — Z8589 Personal history of malignant neoplasm of other organs and systems: Secondary | ICD-10-CM | POA: Diagnosis not present

## 2019-04-19 DIAGNOSIS — R7303 Prediabetes: Secondary | ICD-10-CM | POA: Diagnosis not present

## 2019-04-19 DIAGNOSIS — M79672 Pain in left foot: Secondary | ICD-10-CM

## 2019-04-19 DIAGNOSIS — R2681 Unsteadiness on feet: Secondary | ICD-10-CM | POA: Diagnosis not present

## 2019-04-19 NOTE — Patient Instructions (Signed)
Hand Exercises Hand exercises can be helpful for almost anyone. These exercises can strengthen the hands, improve flexibility and movement, and increase blood flow to the hands. These results can make work and daily tasks easier. Hand exercises can be especially helpful for people who have joint pain from arthritis or have nerve damage from overuse (carpal tunnel syndrome). These exercises can also help people who have injured a hand. Exercises Most of these hand exercises are gentle stretching and motion exercises. It is usually safe to do them often throughout the day. Warming up your hands before exercise may help to reduce stiffness. You can do this with gentle massage or by placing your hands in warm water for 10-15 minutes. It is normal to feel some stretching, pulling, tightness, or mild discomfort as you begin new exercises. This will gradually improve. Stop an exercise right away if you feel sudden, severe pain or your pain gets worse. Ask your health care provider which exercises are best for you. Knuckle bend or "claw" fist 1. Stand or sit with your arm, hand, and all five fingers pointed straight up. Make sure to keep your wrist straight during the exercise. 2. Gently bend your fingers down toward your palm until the tips of your fingers are touching the top of your palm. Keep your big knuckle straight and just bend the small knuckles in your fingers. 3. Hold this position for __________ seconds. 4. Straighten (extend) your fingers back to the starting position. Repeat this exercise 5-10 times with each hand. Full finger fist 1. Stand or sit with your arm, hand, and all five fingers pointed straight up. Make sure to keep your wrist straight during the exercise. 2. Gently bend your fingers into your palm until the tips of your fingers are touching the middle of your palm. 3. Hold this position for __________ seconds. 4. Extend your fingers back to the starting position, stretching every  joint fully. Repeat this exercise 5-10 times with each hand. Straight fist 1. Stand or sit with your arm, hand, and all five fingers pointed straight up. Make sure to keep your wrist straight during the exercise. 2. Gently bend your fingers at the big knuckle, where your fingers meet your hand, and the middle knuckle. Keep the knuckle at the tips of your fingers straight and try to touch the bottom of your palm. 3. Hold this position for __________ seconds. 4. Extend your fingers back to the starting position, stretching every joint fully. Repeat this exercise 5-10 times with each hand. Tabletop 1. Stand or sit with your arm, hand, and all five fingers pointed straight up. Make sure to keep your wrist straight during the exercise. 2. Gently bend your fingers at the big knuckle, where your fingers meet your hand, as far down as you can while keeping the small knuckles in your fingers straight. Think of forming a tabletop with your fingers. 3. Hold this position for __________ seconds. 4. Extend your fingers back to the starting position, stretching every joint fully. Repeat this exercise 5-10 times with each hand. Finger spread 1. Place your hand flat on a table with your palm facing down. Make sure your wrist stays straight as you do this exercise. 2. Spread your fingers and thumb apart from each other as far as you can until you feel a gentle stretch. Hold this position for __________ seconds. 3. Bring your fingers and thumb tight together again. Hold this position for __________ seconds. Repeat this exercise 5-10 times with each hand.  Making circles 1. Stand or sit with your arm, hand, and all five fingers pointed straight up. Make sure to keep your wrist straight during the exercise. 2. Make a circle by touching the tip of your thumb to the tip of your index finger. 3. Hold for __________ seconds. Then open your hand wide. 4. Repeat this motion with your thumb and each finger on your  hand. Repeat this exercise 5-10 times with each hand. Thumb motion 1. Sit with your forearm resting on a table and your wrist straight. Your thumb should be facing up toward the ceiling. Keep your fingers relaxed as you move your thumb. 2. Lift your thumb up as high as you can toward the ceiling. Hold for __________ seconds. 3. Bend your thumb across your palm as far as you can, reaching the tip of your thumb for the small finger (pinkie) side of your palm. Hold for __________ seconds. Repeat this exercise 5-10 times with each hand. Grip strengthening  1. Hold a stress ball or other soft ball in the middle of your hand. 2. Slowly increase the pressure, squeezing the ball as much as you can without causing pain. Think of bringing the tips of your fingers into the middle of your palm. All of your finger joints should bend when doing this exercise. 3. Hold your squeeze for __________ seconds, then relax. Repeat this exercise 5-10 times with each hand. Contact a health care provider if:  Your hand pain or discomfort gets much worse when you do an exercise.  Your hand pain or discomfort does not improve within 2 hours after you exercise. If you have any of these problems, stop doing these exercises right away. Do not do them again unless your health care provider says that you can. Get help right away if:  You develop sudden, severe hand pain or swelling. If this happens, stop doing these exercises right away. Do not do them again unless your health care provider says that you can. This information is not intended to replace advice given to you by your health care provider. Make sure you discuss any questions you have with your health care provider. Document Released: 07/09/2015 Document Revised: 11/18/2018 Document Reviewed: 07/29/2018 Elsevier Patient Education  2020 [employer].

## 2019-04-19 NOTE — Progress Notes (Signed)
Pharmacy Note  Subjective:  Patient presents today to the [facility] to see Dr. Estanislado Pandy.   Patient seen by the pharmacist for counseling on natural anti-inflammatories and Voltaren gel for osteoarthritis.  Objective: Current Outpatient Medications on File Prior to Visit  Medication Sig Dispense Refill  . aspirin EC 81 MG tablet Take 81 mg by mouth daily.    . carbidopa-levodopa (SINEMET IR) 25-100 MG tablet Take 1 tablet by mouth 3 (three) times daily. 270 tablet 3  . lisinopril-hydrochlorothiazide (PRINZIDE,ZESTORETIC) 10-12.5 MG tablet Take 1 tablet by mouth daily.    Marland Kitchen MAGNESIUM PO Take by mouth daily.    . Multiple Vitamin (MULTIVITAMIN) tablet Take 1 tablet by mouth daily.     No current facility-administered medications on file prior to visit.      Assessment/Plan:  Counseled on the purpose, proper use, and adverse effects of natural anti-inflammatories including upset stomach and increased bleeding risk.  Encouraged patient to add one medication at a time and to include on medication list to monitor for adverse effects and drug interactions.  Given educational handout with recommended doses.  Patient on the purpose, proper use, and adverse effects of Voltaren gel including headache, increased blood pressure, and risk of GI bleed.  Instructed patient to avoid applying to open skin wound, or on areas of infection, rash, burn, or peeling skin.  Advised  patient wait at least 10 minutes before dressing or wearing gloves and wait at least 1 hour before you bathe or shower.  Counseled patient to wash hands after application and avoid contact with face/eyes.  Advised patient to apply with q-tip if applying to hands to minimize absorption on palms. It is available over the counter.  All questions encouraged and answered.  Instructed patient to call with any other questions or concerns.  Mariella Saa, PharmD, [place], [place] [facility] Pharmacist [phone]  04/19/2019  11:04 AM

## 2019-04-20 LAB — URIC ACID: Uric Acid, Serum: 7.4 mg/dL — ABNORMAL HIGH (ref 2.5–7.0)

## 2019-04-20 LAB — RHEUMATOID FACTOR: Rheumatoid fact SerPl-aCnc: <14 IU/mL (ref <14)

## 2019-04-20 LAB — SEDIMENTATION RATE: Sed Rate: 2 mm/h (ref 0–30)

## 2019-04-20 LAB — CYCLIC CITRUL PEPTIDE ANTIBODY, IGG: Cyclic Citrullin Peptide Ab: <16 UNITS

## 2019-04-20 NOTE — Progress Notes (Signed)
We will discuss results at the follow-up visit.

## 2019-04-22 DIAGNOSIS — M6281 Muscle weakness (generalized): Secondary | ICD-10-CM | POA: Diagnosis not present

## 2019-04-22 DIAGNOSIS — R293 Abnormal posture: Secondary | ICD-10-CM | POA: Diagnosis not present

## 2019-04-22 DIAGNOSIS — R262 Difficulty in walking, not elsewhere classified: Secondary | ICD-10-CM | POA: Diagnosis not present

## 2019-04-22 DIAGNOSIS — I10 Essential (primary) hypertension: Secondary | ICD-10-CM | POA: Diagnosis not present

## 2019-04-22 DIAGNOSIS — R2681 Unsteadiness on feet: Secondary | ICD-10-CM | POA: Diagnosis not present

## 2019-04-26 DIAGNOSIS — R262 Difficulty in walking, not elsewhere classified: Secondary | ICD-10-CM | POA: Diagnosis not present

## 2019-04-26 DIAGNOSIS — R293 Abnormal posture: Secondary | ICD-10-CM | POA: Diagnosis not present

## 2019-04-26 DIAGNOSIS — M6281 Muscle weakness (generalized): Secondary | ICD-10-CM | POA: Diagnosis not present

## 2019-04-26 DIAGNOSIS — R2681 Unsteadiness on feet: Secondary | ICD-10-CM | POA: Diagnosis not present

## 2019-04-26 DIAGNOSIS — I10 Essential (primary) hypertension: Secondary | ICD-10-CM | POA: Diagnosis not present

## 2019-04-27 ENCOUNTER — Ambulatory Visit (INDEPENDENT_AMBULATORY_CARE_PROVIDER_SITE_OTHER): Payer: Medicare Other | Admitting: Podiatry

## 2019-04-27 ENCOUNTER — Other Ambulatory Visit: Payer: Self-pay

## 2019-04-27 DIAGNOSIS — M779 Enthesopathy, unspecified: Secondary | ICD-10-CM | POA: Diagnosis not present

## 2019-04-27 DIAGNOSIS — G20 Parkinson's disease: Secondary | ICD-10-CM

## 2019-04-27 DIAGNOSIS — R2681 Unsteadiness on feet: Secondary | ICD-10-CM | POA: Diagnosis not present

## 2019-04-28 DIAGNOSIS — R2681 Unsteadiness on feet: Secondary | ICD-10-CM | POA: Diagnosis not present

## 2019-04-28 DIAGNOSIS — R293 Abnormal posture: Secondary | ICD-10-CM | POA: Diagnosis not present

## 2019-04-28 DIAGNOSIS — M6281 Muscle weakness (generalized): Secondary | ICD-10-CM | POA: Diagnosis not present

## 2019-04-28 DIAGNOSIS — I10 Essential (primary) hypertension: Secondary | ICD-10-CM | POA: Diagnosis not present

## 2019-04-28 DIAGNOSIS — R262 Difficulty in walking, not elsewhere classified: Secondary | ICD-10-CM | POA: Diagnosis not present

## 2019-04-29 NOTE — Progress Notes (Signed)
Office Visit Note  Patient: Ashley Neal             Date of Birth: Nov 22, 1945           MRN: [id number]             PCP: Leighton Ruff, MD Referring: Leighton Ruff, MD Visit Date: 05/12/2019 Occupation: @GUAROCC @  Subjective:  Pain and stiffness in hands.   History of Present Illness: Ashley Neal is a 73 y.o. female, retired Copywriter, advertising.  She states she continues to have pain and stiffness in her hands.  She has difficulty holding objects.  She states that she had cortisone shot to her foot and since then her foot discomfort has been better.  She has been using proper fitting shoes.  She has some crepitus in her knees but they are not painful.  None of the other joints are painful.  Activities of Daily Living:  Patient reports morning stiffness for 0 minute.   Patient Denies nocturnal pain.  Difficulty dressing/grooming: Denies Difficulty climbing stairs: Denies Difficulty getting out of chair: Denies Difficulty using hands for taps, buttons, cutlery, and/or writing: Denies  Review of Systems  Constitutional: Negative for fatigue, night sweats, weight gain and weight loss.  HENT: Negative for mouth sores, trouble swallowing, trouble swallowing, mouth dryness and nose dryness.   Eyes: Negative for pain, redness, visual disturbance and dryness.  Respiratory: Negative for cough, shortness of breath and difficulty breathing.   Cardiovascular: Negative for chest pain, palpitations, hypertension, irregular heartbeat and swelling in legs/feet.  Gastrointestinal: Negative for blood in stool, constipation and diarrhea.  Endocrine: Negative for increased urination.  Genitourinary: Negative for vaginal dryness.  Musculoskeletal: Positive for arthralgias and joint pain. Negative for joint swelling, myalgias, muscle weakness, morning stiffness, muscle tenderness and myalgias.  Skin: Negative for color change, rash, hair loss, skin tightness, ulcers and sensitivity to  sunlight.  Allergic/Immunologic: Negative for susceptible to infections.  Neurological: Negative for dizziness, memory loss, night sweats and weakness.  Hematological: Negative for swollen glands.  Psychiatric/Behavioral: Negative for depressed mood and sleep disturbance. The patient is nervous/anxious.     PMFS History:  There are no active problems to display for this patient.   Past Medical History:  Diagnosis Date   Frequent headaches    GERD (gastroesophageal reflux disease)    High cholesterol    Hyperlipidemia    Hypertension    Osteoarthritis    Osteoporosis    Ulcerative colitis ([place])     Family History  Problem Relation Age of Onset   Endometrial cancer Mother    Diabetes Mother    Alzheimer's disease Father    Heart disease Brother    Other Brother        MVA    Healthy Son    Parkinson's disease Maternal Aunt    COPD Sister    Diabetes Sister    Healthy Son    Alcohol abuse Brother    Past Surgical History:  Procedure Laterality Date   CARPAL TUNNEL RELEASE Bilateral 1996/1997   TUBAL LIGATION  1980's   Social History   Social History Narrative   Not on file   Immunization History  Administered Date(s) Administered   Tdap 07/11/2018   Zoster Recombinat (Shingrix) 08/05/2018, 11/12/2018     Objective: Vital Signs: BP 111/70 (BP Location: Left Arm, Patient Position: Sitting, Cuff Size: Normal)    Pulse 72    Resp 13    Ht 5' 1"  (7.680 m)  Wt 148 lb 9.6 oz (67.4 kg)    BMI 28.08 kg/m    Physical Exam Vitals signs and nursing note reviewed.  Constitutional:      Appearance: She is well-developed.  HENT:     Head: Normocephalic and atraumatic.  Eyes:     Conjunctiva/sclera: Conjunctivae normal.  Neck:     Musculoskeletal: Normal range of motion.  Cardiovascular:     Rate and Rhythm: Normal rate and regular rhythm.     Heart sounds: Normal heart sounds.  Pulmonary:     Effort: Pulmonary effort is normal.     Breath  sounds: Normal breath sounds.  Abdominal:     General: Bowel sounds are normal.     Palpations: Abdomen is soft.  Lymphadenopathy:     Cervical: No cervical adenopathy.  Skin:    General: Skin is warm and dry.     Capillary Refill: Capillary refill takes less than 2 seconds.  Neurological:     Mental Status: She is alert and oriented to person, place, and time.  Psychiatric:        Behavior: Behavior normal.      Musculoskeletal Exam: C-spine, shoulder joints with good range of motion.  Elbow joints with good range of motion.  There was no synovitis of her wrist joints.  She has DIP and PIP thickening with very limited range of motion.  She is in complete fist formation.  No synovitis was noted over MCP joints.  She has good range of motion of her hip joints, knee joints, ankles.  She has DIP and PIP thickening in her feet consistent with osteoarthritis.  CDAI Exam: CDAI Score: -- Patient Global: --; Provider Global: -- Swollen: --; Tender: -- Joint Exam   No joint exam has been documented for this visit   There is currently no information documented on the homunculus. Go to the Rheumatology activity and complete the homunculus joint exam.  Investigation: No additional findings.  Imaging: US Venous Img Lower Unilateral Left  Result Date: 05/03/2019 CLINICAL DATA:  73 year old female with a history of pain in the left popliteal fossa EXAM: LEFT LOWER EXTREMITY VENOUS DOPPLER ULTRASOUND TECHNIQUE: Gray-scale sonography with graded compression, as well as color Doppler and duplex ultrasound were performed to evaluate the lower extremity deep venous systems from the level of the common femoral vein and including the common femoral, femoral, profunda femoral, popliteal and calf veins including the posterior tibial, peroneal and gastrocnemius veins when visible. The superficial great saphenous vein was also interrogated. Spectral Doppler was utilized to evaluate flow at rest and with  distal augmentation maneuvers in the common femoral, femoral and popliteal veins. COMPARISON:  None. FINDINGS: Contralateral Common Femoral Vein: Respiratory phasicity is normal and symmetric with the symptomatic side. No evidence of thrombus. Normal compressibility. Common Femoral Vein: No evidence of thrombus. Normal compressibility, respiratory phasicity and response to augmentation. Saphenofemoral Junction: No evidence of thrombus. Normal compressibility and flow on color Doppler imaging. Profunda Femoral Vein: No evidence of thrombus. Normal compressibility and flow on color Doppler imaging. Femoral Vein: No evidence of thrombus. Normal compressibility, respiratory phasicity and response to augmentation. Popliteal Vein: No evidence of thrombus. Normal compressibility, respiratory phasicity and response to augmentation. Calf Veins: No evidence of thrombus. Normal compressibility and flow on color Doppler imaging. Superficial Great Saphenous Vein: No evidence of thrombus. Normal compressibility and flow on color Doppler imaging. Other Findings: Anechoic fluid collection in the left popliteal fossa measuring 3.8 cm x 2.4 cm x 4.3 cm IMPRESSION:  Sonographic survey of the left lower extremity negative for DVT. Left Baker's cyst. Electronically Signed   By: Corrie Mckusick D.O.   On: 05/03/2019 16:12   [place] Extrem Up Bilat Comp  Result Date: 05/04/2019 Ultrasound examination of bilateral hands was performed per EULAR recommendations. Using 12 MHz transducer, grayscale and power Doppler bilateral second MCP joints, bilateral second third and fifth digit PIPs and DIPs and bilateral wrist joints both dorsal and volar aspects were evaluated to look for synovitis or tenosynovitis. The findings were there was no synovitis or tenosynovitis on ultrasound examination.  There was severe narrowing of most PIPs and DIP joints.  No erosive changes were noted.  Right median nerve was 0.10 cm squares which was within normal limits  and left median nerve was 0.07 cm squares which was within normal limits. Impression: Ultrasound examination the ultrasound findings were consistent with severe osteoarthritis there was no synovitis.  Bilateral median nerves are within normal limits.  Dg Foot 2 Views Left  Result Date: 04/13/2019 Please see detailed radiograph report in office note.  Dg Foot 2 Views Right  Result Date: 04/13/2019 Please see detailed radiograph report in office note.  Xr Hand 2 View Left  Result Date: 04/19/2019 Severe narrowing of all PIP and DIP joints was noted.  No MCP, intercarpal radiocarpal joint space narrowing was noted.  Ankylosis of fourth and fifth PIP joints was noted.  No erosive changes were noted. Impression: These findings are consistent with severe osteoarthritis.  Psoriatic arthritis can be in the differential.  Xr Hand 2 View Right  Result Date: 04/19/2019 Severe PIP and DIP narrowing was noted.  Erosive and cystic changes were noted in the second and third PIP joints.  Ankylosis of the fifth PIP joint was noted.  No MCP, intercarpal radiocarpal joint space narrowing was noted. Impression: These findings are consistent with inflammatory osteoarthritis.  Psoriatic arthritis can also mimic these findings.   Recent Labs: Lab Results  Component Value Date   WBC 5.3 09/18/2014   HGB 14.1 09/18/2014   PLT 227 09/18/2014   NA 142 09/18/2014   K 3.7 09/18/2014   CL 106 09/18/2014   CO2 28 09/18/2014   GLUCOSE 98 09/18/2014   BUN 9 09/18/2014   CREATININE 0.87 09/18/2014   CALCIUM 9.9 09/18/2014   GFRAA 78 (L) 09/18/2014  September 2020 uric acid 7.4, ESR 2, RF negative, anti-CCP negative  Speciality Comments: No specialty comments available.  Procedures:  No procedures performed Allergies: Codeine and Tape   Assessment / Plan:     Visit Diagnoses: Primary osteoarthritis of both hands - Severe osteoarthritis with incomplete fist formation. All autoimmune work-up was negative.  Uric  acid is mildly elevated but she has no symptoms of gout.  Ultrasound examination performed recently showed no synovitis.  Detailed counseling regarding osteoarthritis was provided.  Joint protection muscle strengthening was discussed and demonstrated in the office.  She will continue with natural anti-inflammatories and stretching exercises.  Primary osteoarthritis of both feet-she had good relief from cortisone injection.  Proper fitting shoes were discussed.  Age-related osteoporosis without current pathological fracture - Patient was treated with Fosamax in the past which she discontinued due to dental problems.  She does not want any further treatment or DEXA  Other medical problems are listed as follows:  Prediabetes  Essential hypertension  History of hyperlipidemia  History of squamous cell carcinoma  Parkinsonism, unspecified Parkinsonism type (HCC)-she is followed by neurologist and has been going to physical therapy for  balance training.  Family history of Crohn's disease  Family history of psoriasis in father  Orders: No orders of the defined types were placed in this encounter.  No orders of the defined types were placed in this encounter.    Follow-Up Instructions: Return in about 1 year (around 05/11/2020) for Osteoarthritis.   Bo Merino, MD  Note - This record has been created using Editor, commissioning.  Chart creation errors have been sought, but may not always  have been located. Such creation errors do not reflect on  the standard of medical care.

## 2019-05-03 ENCOUNTER — Other Ambulatory Visit (HOSPITAL_BASED_OUTPATIENT_CLINIC_OR_DEPARTMENT_OTHER): Payer: Self-pay | Admitting: Family Medicine

## 2019-05-03 ENCOUNTER — Telehealth: Payer: Self-pay | Admitting: Podiatry

## 2019-05-03 ENCOUNTER — Other Ambulatory Visit: Payer: Self-pay

## 2019-05-03 ENCOUNTER — Ambulatory Visit (HOSPITAL_BASED_OUTPATIENT_CLINIC_OR_DEPARTMENT_OTHER)
Admission: RE | Admit: 2019-05-03 | Discharge: 2019-05-03 | Disposition: A | Discharge status: Home / Self Care | Payer: Medicare Other | Source: Ambulatory Visit | Attending: Family Medicine | Admitting: Family Medicine

## 2019-05-03 DIAGNOSIS — R600 Localized edema: Secondary | ICD-10-CM | POA: Diagnosis not present

## 2019-05-03 DIAGNOSIS — R262 Difficulty in walking, not elsewhere classified: Secondary | ICD-10-CM | POA: Diagnosis not present

## 2019-05-03 DIAGNOSIS — I10 Essential (primary) hypertension: Secondary | ICD-10-CM | POA: Diagnosis not present

## 2019-05-03 DIAGNOSIS — M7122 Synovial cyst of popliteal space [Baker], left knee: Secondary | ICD-10-CM | POA: Insufficient documentation

## 2019-05-03 DIAGNOSIS — M7120 Synovial cyst of popliteal space [Baker], unspecified knee: Secondary | ICD-10-CM | POA: Diagnosis not present

## 2019-05-03 DIAGNOSIS — R2681 Unsteadiness on feet: Secondary | ICD-10-CM | POA: Diagnosis not present

## 2019-05-03 DIAGNOSIS — R293 Abnormal posture: Secondary | ICD-10-CM | POA: Diagnosis not present

## 2019-05-03 DIAGNOSIS — M6281 Muscle weakness (generalized): Secondary | ICD-10-CM | POA: Diagnosis not present

## 2019-05-03 NOTE — Telephone Encounter (Signed)
Left message for pt to call to discuss balance brace coverage and possible schedule an appt. Medicare covers them at 80% and secondary should cover other 20%.

## 2019-05-03 NOTE — Progress Notes (Signed)
Subjective:   Patient ID: Ashley Neal, female   DOB: 73 y.o.   MRN: [id number]   HPI Patient presents stating she is feeling a lot better after medication and states that she wants to discuss bracing   ROS      Objective:  Physical Exam  Neurovascular status intact with patient found to have inflammation first digit bilateral that is localized with no long-term issues with fluid buildup not noted at the current time     Assessment:  Doing well after having injection treatment with chronic deformity     Plan:  Reviewed treatment options and wider shoe gear but do not recommend other treatment currently even though I did discuss surgery with her today and explained to her what would be necessary for surgery

## 2019-05-04 ENCOUNTER — Ambulatory Visit (INDEPENDENT_AMBULATORY_CARE_PROVIDER_SITE_OTHER): Payer: Medicare Other | Admitting: Rheumatology

## 2019-05-04 ENCOUNTER — Ambulatory Visit: Payer: Self-pay

## 2019-05-04 DIAGNOSIS — M79642 Pain in left hand: Secondary | ICD-10-CM | POA: Diagnosis not present

## 2019-05-04 DIAGNOSIS — M79641 Pain in right hand: Secondary | ICD-10-CM

## 2019-05-05 DIAGNOSIS — R2681 Unsteadiness on feet: Secondary | ICD-10-CM | POA: Diagnosis not present

## 2019-05-05 DIAGNOSIS — R293 Abnormal posture: Secondary | ICD-10-CM | POA: Diagnosis not present

## 2019-05-05 DIAGNOSIS — M6281 Muscle weakness (generalized): Secondary | ICD-10-CM | POA: Diagnosis not present

## 2019-05-05 DIAGNOSIS — R262 Difficulty in walking, not elsewhere classified: Secondary | ICD-10-CM | POA: Diagnosis not present

## 2019-05-05 DIAGNOSIS — I10 Essential (primary) hypertension: Secondary | ICD-10-CM | POA: Diagnosis not present

## 2019-05-10 DIAGNOSIS — I10 Essential (primary) hypertension: Secondary | ICD-10-CM | POA: Diagnosis not present

## 2019-05-10 DIAGNOSIS — R2681 Unsteadiness on feet: Secondary | ICD-10-CM | POA: Diagnosis not present

## 2019-05-10 DIAGNOSIS — M6281 Muscle weakness (generalized): Secondary | ICD-10-CM | POA: Diagnosis not present

## 2019-05-10 DIAGNOSIS — R262 Difficulty in walking, not elsewhere classified: Secondary | ICD-10-CM | POA: Diagnosis not present

## 2019-05-10 DIAGNOSIS — F4322 Adjustment disorder with anxiety: Secondary | ICD-10-CM | POA: Diagnosis not present

## 2019-05-10 DIAGNOSIS — R293 Abnormal posture: Secondary | ICD-10-CM | POA: Diagnosis not present

## 2019-05-10 NOTE — Telephone Encounter (Signed)
Called pt again and she answered and is scheduled to see Rick on 10.6.2020

## 2019-05-12 ENCOUNTER — Other Ambulatory Visit: Payer: Self-pay

## 2019-05-12 ENCOUNTER — Ambulatory Visit (INDEPENDENT_AMBULATORY_CARE_PROVIDER_SITE_OTHER): Payer: Medicare Other | Admitting: Rheumatology

## 2019-05-12 ENCOUNTER — Encounter: Payer: Self-pay | Admitting: Rheumatology

## 2019-05-12 VITALS — BP 111/70 | HR 72 | Resp 13 | Ht 61.0 in | Wt 148.6 lb

## 2019-05-12 DIAGNOSIS — Z8639 Personal history of other endocrine, nutritional and metabolic disease: Secondary | ICD-10-CM

## 2019-05-12 DIAGNOSIS — Z840 Family history of diseases of the skin and subcutaneous tissue: Secondary | ICD-10-CM | POA: Diagnosis not present

## 2019-05-12 DIAGNOSIS — R2681 Unsteadiness on feet: Secondary | ICD-10-CM | POA: Diagnosis not present

## 2019-05-12 DIAGNOSIS — I10 Essential (primary) hypertension: Secondary | ICD-10-CM

## 2019-05-12 DIAGNOSIS — Z8379 Family history of other diseases of the digestive system: Secondary | ICD-10-CM | POA: Diagnosis not present

## 2019-05-12 DIAGNOSIS — G20 Parkinson's disease: Secondary | ICD-10-CM

## 2019-05-12 DIAGNOSIS — M19041 Primary osteoarthritis, right hand: Secondary | ICD-10-CM | POA: Diagnosis not present

## 2019-05-12 DIAGNOSIS — M810 Age-related osteoporosis without current pathological fracture: Secondary | ICD-10-CM | POA: Diagnosis not present

## 2019-05-12 DIAGNOSIS — R262 Difficulty in walking, not elsewhere classified: Secondary | ICD-10-CM | POA: Diagnosis not present

## 2019-05-12 DIAGNOSIS — R293 Abnormal posture: Secondary | ICD-10-CM | POA: Diagnosis not present

## 2019-05-12 DIAGNOSIS — M6281 Muscle weakness (generalized): Secondary | ICD-10-CM | POA: Diagnosis not present

## 2019-05-12 DIAGNOSIS — Z8589 Personal history of malignant neoplasm of other organs and systems: Secondary | ICD-10-CM | POA: Diagnosis not present

## 2019-05-12 DIAGNOSIS — M19072 Primary osteoarthritis, left ankle and foot: Secondary | ICD-10-CM | POA: Diagnosis not present

## 2019-05-12 DIAGNOSIS — M19042 Primary osteoarthritis, left hand: Secondary | ICD-10-CM

## 2019-05-12 DIAGNOSIS — R7303 Prediabetes: Secondary | ICD-10-CM

## 2019-05-12 DIAGNOSIS — M19071 Primary osteoarthritis, right ankle and foot: Secondary | ICD-10-CM | POA: Diagnosis not present

## 2019-05-17 ENCOUNTER — Other Ambulatory Visit: Payer: Medicare Other | Admitting: Orthotics

## 2019-05-24 DIAGNOSIS — Z23 Encounter for immunization: Secondary | ICD-10-CM | POA: Diagnosis not present

## 2019-05-31 DIAGNOSIS — F4322 Adjustment disorder with anxiety: Secondary | ICD-10-CM | POA: Diagnosis not present

## 2019-06-21 DIAGNOSIS — F4322 Adjustment disorder with anxiety: Secondary | ICD-10-CM | POA: Diagnosis not present

## 2019-06-24 DIAGNOSIS — K219 Gastro-esophageal reflux disease without esophagitis: Secondary | ICD-10-CM | POA: Diagnosis not present

## 2019-06-24 DIAGNOSIS — R7303 Prediabetes: Secondary | ICD-10-CM | POA: Diagnosis not present

## 2019-06-24 DIAGNOSIS — L659 Nonscarring hair loss, unspecified: Secondary | ICD-10-CM | POA: Diagnosis not present

## 2019-06-24 DIAGNOSIS — G20 Parkinson's disease: Secondary | ICD-10-CM | POA: Diagnosis not present

## 2019-06-24 DIAGNOSIS — E7800 Pure hypercholesterolemia, unspecified: Secondary | ICD-10-CM | POA: Diagnosis not present

## 2019-06-24 DIAGNOSIS — R42 Dizziness and giddiness: Secondary | ICD-10-CM | POA: Diagnosis not present

## 2019-07-11 DIAGNOSIS — F4322 Adjustment disorder with anxiety: Secondary | ICD-10-CM | POA: Diagnosis not present

## 2019-08-24 DIAGNOSIS — F4322 Adjustment disorder with anxiety: Secondary | ICD-10-CM | POA: Diagnosis not present

## 2019-08-29 DIAGNOSIS — F4322 Adjustment disorder with anxiety: Secondary | ICD-10-CM | POA: Diagnosis not present

## 2019-09-12 DIAGNOSIS — F4322 Adjustment disorder with anxiety: Secondary | ICD-10-CM | POA: Diagnosis not present

## 2019-09-19 DIAGNOSIS — F4322 Adjustment disorder with anxiety: Secondary | ICD-10-CM | POA: Diagnosis not present

## 2019-10-04 ENCOUNTER — Other Ambulatory Visit: Payer: Self-pay

## 2019-10-04 ENCOUNTER — Encounter: Payer: Self-pay | Admitting: Neurology

## 2019-10-04 ENCOUNTER — Ambulatory Visit (INDEPENDENT_AMBULATORY_CARE_PROVIDER_SITE_OTHER): Payer: Medicare Other | Admitting: Neurology

## 2019-10-04 VITALS — BP 122/80 | HR 86 | Temp 97.2°F | Ht 61.0 in | Wt 149.3 lb

## 2019-10-04 DIAGNOSIS — K5909 Other constipation: Secondary | ICD-10-CM | POA: Diagnosis not present

## 2019-10-04 DIAGNOSIS — G20 Parkinson's disease: Secondary | ICD-10-CM

## 2019-10-04 DIAGNOSIS — N393 Stress incontinence (female) (male): Secondary | ICD-10-CM

## 2019-10-04 DIAGNOSIS — F4322 Adjustment disorder with anxiety: Secondary | ICD-10-CM | POA: Diagnosis not present

## 2019-10-04 DIAGNOSIS — G2581 Restless legs syndrome: Secondary | ICD-10-CM | POA: Diagnosis not present

## 2019-10-04 MED ORDER — PRAMIPEXOLE DIHYDROCHLORIDE 0.125 MG PO TABS: 0.1250 mg | ORAL_TABLET | Freq: Every day | ORAL | 5 refills | Status: DC

## 2019-10-04 MED ORDER — CARBIDOPA-LEVODOPA 25-100 MG PO TABS: 1.0000 | ORAL_TABLET | Freq: Three times a day (TID) | ORAL | 3 refills | Status: DC

## 2019-10-04 NOTE — Patient Instructions (Signed)
Your exam has been fairly stable.  You have had some restless leg symptoms.  I would like to suggest Mirapex (generic name: pramipexole) 0.125 mg: Take 1 pill each night At around 830 or 9 PM. We can increase this over time if needed. The medication is approved for restless leg syndrome as well as Parkinson's disease.  For now, we will use a small dose at night only. Common side effects reported are: Sedation, sleepiness, nausea, vomiting, and rare side effects are confusion, hallucinations, swelling in legs, and abnormal behaviors, including impulse control problems, which can manifest as excessive eating, obsessions with food or gambling, or hypersexuality.  I have also renewed your Sinemet prescription for mail order pharmacy, the Mirapex prescription went to your retail pharmacy.  Please continue to exercise, be proactive about constipation issues.  For your urinary incontinence, restart doing your Kegel exercises and also ask your primary care physician about seeing a urologist.  Follow-up in 6 months, please keep [place] posted as to how you are doing with the new medication for restless leg syndrome, we can switch your prescription to mail order pharmacy if need be.

## 2019-10-04 NOTE — Progress Notes (Signed)
Subjective:    Patient ID: Ashley Neal is a 74 y.o. female.  HPI     Interim history:    Ashley Neal is a 74 year old right-handed woman with an underlying medical history of hypertension, hyperlipidemia, history of colitis, osteoarthritis, osteoporosis, reflux disease, anxiety and obesity, who presents for follow-up consultation of her right sided parkinsonism. The patient is unaccompanied today. I last saw her on 03/31/2019, at which time, she reported overall doing well, sometimes she had some more gait instability. She was active, but not able to pursue any of her group exercises including cardio drumming.  She had issues with constipation. She had bilateral foot pain, and was supposed to see a podiatrist soon. She was considering CBD oil for arthritis and pain.  Today, 10/04/2019: She reports that she feels less stable at times, she has benefited from the Sinemet but it is hard for her to stick to the schedule.  She has had more tremor.  Constipation is better because of her magnesium.  She has had some urinary incontinence particularly when she is dressing herself and lifting her top over her head.  She is wondering if she would be eligible for any treatment options for her bladder dysfunction but has not seen urology.  She tries to stay active mentally and physically.  She tries to walk on a regular basis.  She is not planning to take the Covid vaccine. She has had more issues with arthritis affecting her hands. She has noticed twitching in her feet at night, she has restlessness at night.  She feels like sometimes it keeps her up in the evening when she is trying to fall asleep. No history of anemia or iron deficiency.  She has had some hair loss and dry/itchy scalp.  She was checked for thyroid disease and it came back benign.   The patient's allergies, current medications, family history, past medical history, past social history, past surgical history and problem list were reviewed and  updated as appropriate.      Previously:   I saw her on 09/29/2018, at which time she reported that she tried tai chi but did not enjoy it.  She looked into boxing classes but decided not to pursue it, she did start cardio drumming which she was enjoying as well as chair yoga.  She had occasional symptoms of restless legs.  She felt stable motor wise, she had an occasional flareup in the right hand tremor, rare constipation issues.  She was advised to continue with Sinemet 1 pill 3 times daily.    I saw her on 03/29/2018 at which time she reported doing okay. She was taking Sinemet 3 times a day but sometimes would forget the midday dose. Is trying to exercise on a regular basis, would go to the gym about 3 days of the week. 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 I suggested she continue with Sinemet 3 times a day.   I saw her on 11/11/2017, at which time she reported diffuse aches and pains. She had seen orthopedics. She had blood work with PCP. She had seen Dr. Carles Collet twice. She was trying to exercise. She was advised to follow-up routinely and advised that we would continue to observe her and follow clinically.   She called in the interim in late April after she had seen rheumatology. She was advised to start Sinemet with gradual titration at the time.   [name] on 04/16/2017 at the request of  her primary care physician, at which time she reported a several month history of intermittent right hand tremors and also additional symptoms including difficulty turning over in bed or certain movements when dancing. I suggested observation and a 6 month follow-up. I did suggest we could proceed with a brain MRI but she declined as she was highly claustrophobic and simple anti-anxiety medication would not help. She had interim appointments with Dr. Carles Collet on 06/09/17 and again on 10/20/17. I reviewed the notes. Potential symptomatic medication was discussed, a PET scan was also discussed.       04/16/2017: (She) reports a several month history of a intermittent right hand tremor. She also reports difficulty with certain movements such as turning over in bed or certain movements when dancing. I reviewed your office note from 04/09/2017, which you kindly included. She reports recent weight loss without particularly trying to lose weight. Blood work through your office from 04/02/2017 was reviewed revealing normal CMP with a glucose level of 105, total cholesterol of 107, triglycerides 171, LDL 126. Recent A1c on 04/09/2017 was borderline at 5.8. CBC with differential was normal, B12 434. N TSH and N CK level on 04/09/17.  No FHx of PD or ET, father had dementia and lived to be 78, mom died at 41 from metastatic endom cancer. One brother with asthma, 2 sister, younger brother with EtOH abuse, youngest brother died at 74 yo in a car accident.  She has R shoulder problems, has had back pain in the mid back. Has had dexterity issue. She saw ortho at [facility], and has been to [facility] for PT.  She is doing exercises. 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  Has retired recently, 2 grown sons, one of them local, the other in [place].  She is a non-smoker.  Her Past Medical History Is Significant For: Past Medical History:  Diagnosis Date  . Frequent headaches   . GERD (gastroesophageal reflux disease)   . High cholesterol   . Hyperlipidemia   . Hypertension   . Osteoarthritis   . Osteoporosis   . Ulcerative colitis ([place])     Her Past Surgical History Is Significant For: Past Surgical History:  Procedure Laterality Date  . CARPAL TUNNEL RELEASE Bilateral 1996/1997  . TUBAL LIGATION  1980's    Her Family History Is Significant For: Family History  Problem Relation Age of Onset  . Endometrial cancer Mother   . Diabetes Mother   . Alzheimer's disease Father   . Heart disease Brother   . Other Brother        MVA   . Healthy Son   . Parkinson's disease Maternal  Aunt   . COPD Sister   . Diabetes Sister   . Healthy Son   . Alcohol abuse Brother     Her Social History Is Significant For: Social History   Socioeconomic History  . Marital status: Divorced    Spouse name: Not on file  . Number of children: Not on file  . Years of education: Not on file  . Highest education level: Not on file  Occupational History  . Occupation: retired    Comment: Chiropractor  Tobacco Use  . Smoking status: Never Smoker  . Smokeless tobacco: Never Used  Substance and Sexual Activity  . Alcohol use: Yes    Comment: rare  . Drug use: No  . Sexual activity: Not on file  Other Topics Concern  . Not on file  Social History  Narrative  . Not on file   Social Determinants of Health   Financial Resource Strain:   . Difficulty of Paying Living Expenses: Not on file  Food Insecurity:   . Worried About Charity fundraiser in the Last Year: Not on file  . Ran Out of Food in the Last Year: Not on file  Transportation Needs:   . Lack of Transportation (Medical): Not on file  . Lack of Transportation (Non-Medical): Not on file  Physical Activity:   . Days of Exercise per Week: Not on file  . Minutes of Exercise per Session: Not on file  Stress:   . Feeling of Stress : Not on file  Social Connections:   . Frequency of Communication with Friends and Family: Not on file  . Frequency of Social Gatherings with Friends and Family: Not on file  . Attends Religious Services: Not on file  . Active Member of Clubs or Organizations: Not on file  . Attends Archivist Meetings: Not on file  . Marital Status: Not on file    Her Allergies Are:  Allergies  Allergen Reactions  . Codeine Other (See Comments)    unknown  . Tape Other (See Comments)    unknown  :   Her Current Medications Are:  Outpatient Encounter Medications as of 10/04/2019  Medication Sig  . aspirin EC 81 MG tablet Take 81 mg by mouth daily.  . carbidopa-levodopa (SINEMET IR)  25-100 MG tablet Take 1 tablet by mouth 3 (three) times daily.  Marland Kitchen lisinopril-hydrochlorothiazide (PRINZIDE,ZESTORETIC) 10-12.5 MG tablet Take 1 tablet by mouth daily.  Marland Kitchen MAGNESIUM PO Take by mouth daily.  . Multiple Vitamin (MULTIVITAMIN) tablet Take 1 tablet by mouth daily.  Marland Kitchen nystatin (MYCOSTATIN) 100000 UNIT/ML suspension RINSE WITH 5ML FOR 2 MIN FOUR TO FIVE TIMES THEN SPIT  . [DISCONTINUED] carbidopa-levodopa (SINEMET IR) 25-100 MG tablet Take 1 tablet by mouth 3 (three) times daily.  . pramipexole (MIRAPEX) 0.125 MG tablet Take 1 tablet (0.125 mg total) by mouth at bedtime.   No facility-administered encounter medications on file as of 10/04/2019.  :  Review of Systems:  Out of a complete 14 point review of systems, all are reviewed and negative with the exception of these symptoms as listed below: Review of Systems  Neurological:       6 month f/u reports sx have worsened since last visit.     Objective:  Neurological Exam  Physical Exam Physical Examination:   Vitals:   10/04/19 1032  BP: 122/80  Pulse: 86  Temp: (!) 97.2 F (36.2 C)    General Examination: The patient is a very pleasant 74 y.o. female in no acute distress. She appears well-developed and well-nourished and well groomed.   HEENT:Normocephalic, atraumatic, pupils are equal, round and reactive to light, corrective eyeglasses in place. Extraocular tracking is good without limitation to gaze excursion or nystagmus noted. Normal smooth pursuit is noted. Hearing is grossly intact. Face is symmetric with normal facial sensation andmild facial maskingnoted. Speech is clear, no dysarthria, no sialorrhea. She hasmildhypophonia.Neck is mildly rigid with good range of motion.She hasa benign airway exam, With the exception of moderate mouth dryness noted.  Chest, heart, abdomen and extremities:  Chest is clear to auscultation, heart sounds normal.   Thereare prominentarthritic changes in both hands.  Puffy ankles but not in the realm of pitting edema, all stable.   Skin: Warm and dry without trophic changes noted.  Musculoskeletal:Good range of motion, difficulty with  hand movements secondary to arthritis and trigger finger-like changes, stable.  Neurologically:  Mental status: The patient is awake, alert and oriented in all 4 spheres.Herimmediate and remote memory, attention, language skills and fund of knowledge are appropriate. There is no evidence of aphasia, agnosia, apraxia or anomia. Speech is clear with normal prosody and enunciation. Thought process is linear. Mood is normaland affect is normal.  Cranial nerves II - XII are as described above under HEENT exam.Left shoulder isslightly higher than right, stable. Motor exam: Normal bulk,and strength is noted for age. She hasa mildincrease in tone in the right upper extremity, stable.  (On9/01/2017:On Archimedes spiral drawing there is no significant trembling noted. Her handwriting is not tremulous and legible, slightly micrographic.)  Fine motor skills and coordination: She has mild difficulty on the rightUE and LE. Cerebellar testing: No dysmetria or intention tremor on finger to nose testing. There is no truncal or gait ataxia.  Sensory exam: intact to light touchinthe upper and lower extremities.  Gait, station and balance:Shestands easily. No veering to one side is noted. No leaning to one side is noted. Posture is mildly stooped. Stance is narrow based.she walks with good pace, fairly good stride length,milddecrease in arm swing on the right > left, she turnsin 3 steps.Balance is preserved.   Assessmentand Plan:   In summary,Ashley V Trainoris a very pleasant 57 year oldfemalewith an underlying medical history of hypertension, hyperlipidemia, history of colitis, osteoarthritis, osteoporosis, reflux disease, anxiety and obesity, whopresents for follow-up consultation of her right-sided  parkinsonism, most likely right-sided predominant Parkinson's disease. She has benefited from Sinemet generic. She takes it 3 times a day with good tolerance reportedand improvement in the tremor. She has some days where she feels worse.  She has noted some problems with urinary continence.  She has had more restless leg symptoms at night.  She is reluctant to have a second medication for Parkinson's disease but is open to trying Mirapex and low-dose for restless legs at night.  She is advised to start generic Mirapex 0.125 mg strength 1 pill at night, 1-1/2 to 2 hours before her bedtime.  She is advised that we may have to increase it over time and we may even utilize it during the day for her Parkinson's disease but for now she is reluctant to add any more medication.  She Had physical therapy which was helpful.  She had been doing some Kegel exercises but has not done them lately.  She is encouraged to seek consultation with urology about her urinary incontinence.  Her constipation is improved. She had a second opinion with Dr. Carles Collet. We talked about the importance of healthy lifestyle again today And the importance of staying proactive about constipation.  She is advised to continue with generic Sinemet 1 pill 3 times a day. I renewed her prescription. I Provided a new prescription for Mirapex 0.125 mg strength once daily at night.  She is advised to call our office if she would like to change the prescription to a long-term mail-order prescription.  She is advised to follow-up routinely in 6 months, sooner if needed.  I answered all her questions today and she was in agreement. I spent 30 minutes in total face-to-face time and in reviewing records during pre-charting, more than 50% of which was spent in counseling and coordination of care, reviewing test results, reviewing medications and treatment regimen and/or in discussing or reviewing the diagnosis of PD, RLS, the prognosis and treatment options.  Pertinent laboratory and imaging test  results that were available during this visit with the patient were reviewed by me and considered in my medical decision making (see chart for details).

## 2019-10-13 DIAGNOSIS — F4322 Adjustment disorder with anxiety: Secondary | ICD-10-CM | POA: Diagnosis not present

## 2019-10-26 DIAGNOSIS — F4322 Adjustment disorder with anxiety: Secondary | ICD-10-CM | POA: Diagnosis not present

## 2019-10-31 DIAGNOSIS — F4322 Adjustment disorder with anxiety: Secondary | ICD-10-CM | POA: Diagnosis not present

## 2019-11-24 DIAGNOSIS — M79651 Pain in right thigh: Secondary | ICD-10-CM | POA: Diagnosis not present

## 2019-11-29 DIAGNOSIS — F4322 Adjustment disorder with anxiety: Secondary | ICD-10-CM | POA: Diagnosis not present

## 2019-12-05 DIAGNOSIS — M549 Dorsalgia, unspecified: Secondary | ICD-10-CM | POA: Diagnosis not present

## 2019-12-05 DIAGNOSIS — M79606 Pain in leg, unspecified: Secondary | ICD-10-CM | POA: Diagnosis not present

## 2019-12-13 DIAGNOSIS — G5701 Lesion of sciatic nerve, right lower limb: Secondary | ICD-10-CM | POA: Diagnosis not present

## 2019-12-13 DIAGNOSIS — R5383 Other fatigue: Secondary | ICD-10-CM | POA: Diagnosis not present

## 2019-12-13 DIAGNOSIS — R8290 Unspecified abnormal findings in urine: Secondary | ICD-10-CM | POA: Diagnosis not present

## 2019-12-13 DIAGNOSIS — F4322 Adjustment disorder with anxiety: Secondary | ICD-10-CM | POA: Diagnosis not present

## 2019-12-16 DIAGNOSIS — R351 Nocturia: Secondary | ICD-10-CM | POA: Diagnosis not present

## 2019-12-16 DIAGNOSIS — R350 Frequency of micturition: Secondary | ICD-10-CM | POA: Diagnosis not present

## 2019-12-16 DIAGNOSIS — N3941 Urge incontinence: Secondary | ICD-10-CM | POA: Diagnosis not present

## 2019-12-27 DIAGNOSIS — F4322 Adjustment disorder with anxiety: Secondary | ICD-10-CM | POA: Diagnosis not present

## 2020-01-10 DIAGNOSIS — F4322 Adjustment disorder with anxiety: Secondary | ICD-10-CM | POA: Diagnosis not present

## 2020-01-17 DIAGNOSIS — R7303 Prediabetes: Secondary | ICD-10-CM | POA: Diagnosis not present

## 2020-01-17 DIAGNOSIS — I10 Essential (primary) hypertension: Secondary | ICD-10-CM | POA: Diagnosis not present

## 2020-01-17 DIAGNOSIS — F418 Other specified anxiety disorders: Secondary | ICD-10-CM | POA: Diagnosis not present

## 2020-01-17 DIAGNOSIS — M810 Age-related osteoporosis without current pathological fracture: Secondary | ICD-10-CM | POA: Diagnosis not present

## 2020-01-17 DIAGNOSIS — E7800 Pure hypercholesterolemia, unspecified: Secondary | ICD-10-CM | POA: Diagnosis not present

## 2020-01-17 DIAGNOSIS — G20 Parkinson's disease: Secondary | ICD-10-CM | POA: Diagnosis not present

## 2020-01-17 DIAGNOSIS — K219 Gastro-esophageal reflux disease without esophagitis: Secondary | ICD-10-CM | POA: Diagnosis not present

## 2020-01-17 DIAGNOSIS — Z0000 Encounter for general adult medical examination without abnormal findings: Secondary | ICD-10-CM | POA: Diagnosis not present

## 2020-01-17 DIAGNOSIS — F329 Major depressive disorder, single episode, unspecified: Secondary | ICD-10-CM | POA: Diagnosis not present

## 2020-01-24 DIAGNOSIS — F4322 Adjustment disorder with anxiety: Secondary | ICD-10-CM | POA: Diagnosis not present

## 2020-02-28 DIAGNOSIS — F4322 Adjustment disorder with anxiety: Secondary | ICD-10-CM | POA: Diagnosis not present

## 2020-03-09 DIAGNOSIS — H57813 Brow ptosis, bilateral: Secondary | ICD-10-CM | POA: Diagnosis not present

## 2020-03-09 DIAGNOSIS — H02831 Dermatochalasis of right upper eyelid: Secondary | ICD-10-CM | POA: Diagnosis not present

## 2020-03-09 DIAGNOSIS — H02413 Mechanical ptosis of bilateral eyelids: Secondary | ICD-10-CM | POA: Diagnosis not present

## 2020-03-09 DIAGNOSIS — H02834 Dermatochalasis of left upper eyelid: Secondary | ICD-10-CM | POA: Diagnosis not present

## 2020-03-13 DIAGNOSIS — L821 Other seborrheic keratosis: Secondary | ICD-10-CM | POA: Diagnosis not present

## 2020-03-13 DIAGNOSIS — L720 Epidermal cyst: Secondary | ICD-10-CM | POA: Diagnosis not present

## 2020-03-13 DIAGNOSIS — F4322 Adjustment disorder with anxiety: Secondary | ICD-10-CM | POA: Diagnosis not present

## 2020-03-13 DIAGNOSIS — D225 Melanocytic nevi of trunk: Secondary | ICD-10-CM | POA: Diagnosis not present

## 2020-03-27 DIAGNOSIS — F4322 Adjustment disorder with anxiety: Secondary | ICD-10-CM | POA: Diagnosis not present

## 2020-04-02 ENCOUNTER — Other Ambulatory Visit: Payer: Self-pay

## 2020-04-02 ENCOUNTER — Encounter: Payer: Self-pay | Admitting: Neurology

## 2020-04-02 ENCOUNTER — Ambulatory Visit (INDEPENDENT_AMBULATORY_CARE_PROVIDER_SITE_OTHER): Payer: Medicare Other | Admitting: Neurology

## 2020-04-02 VITALS — BP 132/82 | Ht 61.0 in | Wt 149.0 lb

## 2020-04-02 DIAGNOSIS — G20 Parkinson's disease: Secondary | ICD-10-CM | POA: Diagnosis not present

## 2020-04-02 DIAGNOSIS — G20C Parkinsonism, unspecified: Secondary | ICD-10-CM

## 2020-04-02 MED ORDER — CARBIDOPA-LEVODOPA 25-100 MG PO TABS: 1.0000 | ORAL_TABLET | Freq: Four times a day (QID) | ORAL | 3 refills | Status: DC

## 2020-04-02 NOTE — Patient Instructions (Signed)
As discussed, I would like to see you reap more benefit from the levodopa you increased it to 1 pill 4 times a day.  Therefore, take 1 pill at 6 AM, 11 AM, 4 PM and 9 PM for now.  Please call us or email [place] through [place] in the next 2 to 3 weeks for an update.  If this goes well, I would like for you to continue with this, I do have an updated prescription on file for you which I [facility] in [place].  Please continue to take good care of yourself including good hydration with water, exercise on a regular basis like you are.  If you want, I can make a referral to your personal trainer/physical therapist for in-home or outpatient care through your insurance coverage.  We will also consider a disability placard for your car at the next visit.  I wish you safe travels to [place].  I have you have a good time, stay safe!

## 2020-04-02 NOTE — Progress Notes (Signed)
Subjective:    Patient ID: ZENDAYA GROSECLOSE is a 74 y.o. female.  HPI     Interim history:   Ms. Behlke is a 74 year old right-handed woman with an underlying medical history of hypertension, hyperlipidemia, history of colitis, osteoarthritis, osteoporosis, reflux disease, anxiety and obesity, who presents for follow-up consultation of her right sided parkinsonism. The patient is unaccompanied today. I last saw her on 10/04/2019, at which time she reported Some postural instability.  She felt that she had more tremor.  She had worsening urinary incontinence but constipation was better.  She was encouraged to see urology.  She endorsed some restlessness particularly affecting her feet at night.  She was advised to start low-dose Mirapex particularly for restless leg symptoms.  Today, 04/02/2020: She reports that her posture has become worse and tremor tends to get worse.  She stopped taking the Mirapex because she did not have any leg twitching any longer and she took it for about 6 weeks.  She has not had any recent falls.  She tries to stay active.  She is planning to visit her high school sweetheart in [place] after not seeing him for many years and they recently reconnected about 4 years ago.  He visited her once and she has been invited to spend about a week on his farm in [place].  She is planning to fly sometime in October.  She had evaluation through urology.  She was told her pelvic floor muscles were strong.  She has been doing exercises with the group and she would like to do more therapy one-on-one if possible either through a home visit or after the group class.  She is going to check with her trainer, who is a trained physical therapist and has special training and Parkinson's disease as well as I understand.  She takes her Sinemet 3 times a day, typically at 6 AM, 3 PM and 10 PM.  She reports multiple allergies including several animal allergies.  She is somewhat apprehensive about  spending time [place] a farm.  She has taken Benadryl before but would be open to taking Zyrtec.  She has had low blood pressure values at times and her blood pressure medicine was changed from lisinopril plus hydrochlorothiazide to just lisinopril.  She had recent evaluation through a chest x-ray and EKG and was told that there were no PVCs on the EKG.  She ended up taking the [employer] single dose Covid vaccine.  She did well with it.  The patient's allergies, current medications, family history, past medical history, past social history, past surgical history and problem list were reviewed and updated as appropriate.     Previously:   I saw her on 03/31/2019, at which time, she reported overall doing well, sometimes she had some more gait instability. She was active, but not able to pursue any of her group exercises including cardio drumming.  She had issues with constipation. She had bilateral foot pain, and was supposed to see a podiatrist soon. She was considering CBD oil for arthritis and pain.       I saw her on 09/29/2018, at which time she reported that she tried tai chi but did not enjoy it.  She looked into boxing classes but decided not to pursue it, she did start cardio drumming which she was enjoying as well as chair yoga.  She had occasional symptoms of restless legs.  She felt stable motor wise, she had an occasional flareup in the right hand  tremor, rare constipation issues.  She was advised to continue with Sinemet 1 pill 3 times daily.    I saw her on 03/29/2018 at which time she reported doing okay. She was taking Sinemet 3 times a day but sometimes would forget the midday dose. Is trying to exercise on a regular basis, would go to the gym about 3 days of the week. 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 I suggested she continue with Sinemet 3 times a day.   I saw her on 11/11/2017, at which time she reported diffuse aches and pains. She had seen orthopedics.  She had blood work with PCP. She had seen Dr. Carles Collet twice. She was trying to exercise. She was advised to follow-up routinely and advised that we would continue to observe her and follow clinically.   She called in the interim in late April after she had seen rheumatology. She was advised to start Sinemet with gradual titration at the time.   [name] on 04/16/2017 at the request of her primary care physician, at which time she reported a several month history of intermittent right hand tremors and also additional symptoms including difficulty turning over in bed or certain movements when dancing. I suggested observation and a 6 month follow-up. I did suggest we could proceed with a brain MRI but she declined as she was highly claustrophobic and simple anti-anxiety medication would not help. She had interim appointments with Dr. Carles Collet on 06/09/17 and again on 10/20/17. I reviewed the notes. Potential symptomatic medication was discussed, a PET scan was also discussed.      04/16/2017: (She) reports a several month history of a intermittent right hand tremor. She also reports difficulty with certain movements such as turning over in bed or certain movements when dancing. I reviewed your office note from 04/09/2017, which you kindly included. She reports recent weight loss without particularly trying to lose weight. Blood work through your office from 04/02/2017 was reviewed revealing normal CMP with a glucose level of 105, total cholesterol of 107, triglycerides 171, LDL 126. Recent A1c on 04/09/2017 was borderline at 5.8. CBC with differential was normal, B12 434. N TSH and N CK level on 04/09/17.  No FHx of PD or ET, father had dementia and lived to be 105, mom died at 59 from metastatic endom cancer. One brother with asthma, 2 sister, younger brother with EtOH abuse, youngest brother died at 5 yo in a car accident.  She has R shoulder problems, has had back pain in the mid back. Has had dexterity issue.  She saw ortho at [place], and has been to [facility] for PT.  She is doing exercises. 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  Has retired recently, 2 grown sons, one of them local, the other in [place].  She is a non-smoker.  Her Past Medical History Is Significant For: Past Medical History:  Diagnosis Date  . Frequent headaches   . GERD (gastroesophageal reflux disease)   . High cholesterol   . Hyperlipidemia   . Hypertension   . Osteoarthritis   . Osteoporosis   . Ulcerative colitis ([place])     Her Past Surgical History Is Significant For: Past Surgical History:  Procedure Laterality Date  . CARPAL TUNNEL RELEASE Bilateral 1996/1997  . TUBAL LIGATION  1980's    Her Family History Is Significant For: Family History  Problem Relation Age of Onset  . Endometrial cancer Mother   .  Diabetes Mother   . Alzheimer's disease Father   . Heart disease Brother   . Other Brother        MVA   . Healthy Son   . Parkinson's disease Maternal Aunt   . COPD Sister   . Diabetes Sister   . Healthy Son   . Alcohol abuse Brother     Her Social History Is Significant For: Social History   Socioeconomic History  . Marital status: Divorced    Spouse name: Not on file  . Number of children: Not on file  . Years of education: Not on file  . Highest education level: Not on file  Occupational History  . Occupation: retired    Comment: Chiropractor  Tobacco Use  . Smoking status: Never Smoker  . Smokeless tobacco: Never Used  Vaping Use  . Vaping Use: Never used  Substance and Sexual Activity  . Alcohol use: Yes    Comment: rare  . Drug use: No  . Sexual activity: Not on file  Other Topics Concern  . Not on file  Social History Narrative  . Not on file   Social Determinants of Health   Financial Resource Strain:   . Difficulty of Paying Living Expenses: Not on file  Food Insecurity:   . Worried About Charity fundraiser in the Last Year: Not on file  .  Ran Out of Food in the Last Year: Not on file  Transportation Needs:   . Lack of Transportation (Medical): Not on file  . Lack of Transportation (Non-Medical): Not on file  Physical Activity:   . Days of Exercise per Week: Not on file  . Minutes of Exercise per Session: Not on file  Stress:   . Feeling of Stress : Not on file  Social Connections:   . Frequency of Communication with Friends and Family: Not on file  . Frequency of Social Gatherings with Friends and Family: Not on file  . Attends Religious Services: Not on file  . Active Member of Clubs or Organizations: Not on file  . Attends Archivist Meetings: Not on file  . Marital Status: Not on file    Her Allergies Are:  Allergies  Allergen Reactions  . Codeine Other (See Comments)    unknown  . Tape Other (See Comments)    unknown  :   Her Current Medications Are:  Outpatient Encounter Medications as of 04/02/2020  Medication Sig  . aspirin EC 81 MG tablet Take 81 mg by mouth daily.  . carbidopa-levodopa (SINEMET IR) 25-100 MG tablet Take 1 tablet by mouth 3 (three) times daily.  Marland Kitchen lisinopril-hydrochlorothiazide (PRINZIDE,ZESTORETIC) 10-12.5 MG tablet Take 1 tablet by mouth daily.  Marland Kitchen MAGNESIUM PO Take by mouth daily.  . Multiple Vitamin (MULTIVITAMIN) tablet Take 1 tablet by mouth daily.  Marland Kitchen nystatin (MYCOSTATIN) 100000 UNIT/ML suspension RINSE WITH 5ML FOR 2 MIN FOUR TO FIVE TIMES THEN SPIT (Patient not taking: Reported on 04/02/2020)  . pramipexole (MIRAPEX) 0.125 MG tablet Take 1 tablet (0.125 mg total) by mouth at bedtime. (Patient not taking: Reported on 04/02/2020)   No facility-administered encounter medications on file as of 04/02/2020.  :  Review of Systems:  Out of a complete 14 point review of systems, all are reviewed and negative with the exception of these symptoms as listed below: Review of Systems  Neurological:       Pt reports since her last visit- she has noticed an increase in stumbling and  down times(between med dosages).    Objective:  Neurological Exam  Physical Exam Physical Examination:   Vitals:   04/02/20 1256  BP: 132/82    General Examination: The patient is a very pleasant 74 y.o. female in no acute distress. She appears well-developed and well-nourished and well groomed.   HEENT:Normocephalic, atraumatic, pupils are equal, round and reactive to light, corrective eyeglasses in place. Extraocular tracking is good without limitation to gaze excursion or nystagmus noted. Normal smooth pursuit is noted. Hearing is grossly intact. Face is symmetric with normal facial sensation andmild facial maskingnoted. Speech is clear, no dysarthria, no sialorrhea. She hasmildhypophonia.Neck is mildly rigid with good range of motion.She hasa benign airway exam, mild mouth dryness noted.  Chest, heart, abdomen and extremities:  Chest is clear to auscultation, heart sounds normal.   Thereare prominentarthritic changes in both hands. Puffy ankles but not in the realm of pitting edema, all stable.   Skin: Warm and dry without trophic changes noted.  Musculoskeletal:Good range of motion, difficulty with hand movements secondary to arthritis and trigger finger-like changes, stable.  Neurologically:  Mental status: The patient is awake, alert and oriented in all 4 spheres.Herimmediate and remote memory, attention, language skills and fund of knowledge are appropriate. There is no evidence of aphasia, agnosia, apraxia or anomia. Speech is clear with normal prosody and enunciation. Thought process is linear. Mood is normaland affect is normal.  Cranial nerves II - XII are as described above under HEENT exam.Left shoulder isslightly higher than right, stable. Motor exam: Normal bulk,and strength is noted for age. She hasa mildincrease in tone in the right upper extremity, stable.  She has a mild resting tremor in the right upper extremity.  Overall, mild slowness  in movements.  (On9/01/2017:On Archimedes spiral drawing there is no significant trembling noted. Her handwriting is not tremulous and legible, slightly micrographic.)  Fine motor skills and coordination: She has mild difficulty on the rightUE and LE. Cerebellar testing: No dysmetria or intention tremor on finger to nose testing. There is no truncal or gait ataxia.  Sensory exam: intact to light touchinthe upper and lower extremities.  Gait, station and balance:Shestands easily. No veering to one side is noted. No leaning to one side is noted. Posture ismildlystooped, slightly worse. Stance is narrow based.she walks with good pace, fairly good stride length,milddecrease in arm swing on the right> left, she turnsin 3 steps.Balance is preserved.   Assessmentand Plan:   In summary,Barry V Trainoris a very pleasant 56 year oldfemalewith an underlying medical history of hypertension, hyperlipidemia, history of colitis, osteoarthritis, osteoporosis, reflux disease, anxiety and obesity, whopresents for follow-up consultation of her right-sided parkinsonism, most likely right-sided predominant Parkinson's disease.She has benefited from Sinemet generic. She takes it 3 times a day with good tolerance reportedand improvement in the tremor.   She had a second opinion with Dr. Carles Collet in the recent past.  She has had some worsening of her tremor and some worsening of her posture.  Thankfully, no recent falls.  She has been active physically.  She is going to look into one-on-one training with her physical therapist or therapy at home.  I would be happy to help her with a referral if needed.  She is advised to increase her Sinemet to 1 pill 4 times a day namely at 6 AM, 11 AM, 4 PM and 9 PM.  She is advised to let us know if this works well for her in the next 2 to 3 weeks with an email  update or phone update.  I have adjusted her prescription.  She is advised to stay well-hydrated with water.   She is advised to follow-up routinely in 6 months, sooner if needed.  We tried Mirapex for restless leg symptoms in the recent past, she stopped taking the low-dose after about 6 weeks as she felt better.  I answered all her questions today and she was in agreement. I spent 30 minutes in total face-to-face time and in reviewing records during pre-charting, more than 50% of which was spent in counseling and coordination of care, reviewing test results, reviewing medications and treatment regimen and/or in discussing or reviewing the diagnosis of PD, the prognosis and treatment options. Pertinent laboratory and imaging test results that were available during this visit with the patient were reviewed by me and considered in my medical decision making (see chart for details).

## 2020-04-09 ENCOUNTER — Encounter: Payer: Self-pay | Admitting: Neurology

## 2020-04-10 DIAGNOSIS — F4322 Adjustment disorder with anxiety: Secondary | ICD-10-CM | POA: Diagnosis not present

## 2020-04-13 DIAGNOSIS — R262 Difficulty in walking, not elsewhere classified: Secondary | ICD-10-CM | POA: Diagnosis not present

## 2020-04-13 DIAGNOSIS — R2681 Unsteadiness on feet: Secondary | ICD-10-CM | POA: Diagnosis not present

## 2020-04-18 DIAGNOSIS — R2681 Unsteadiness on feet: Secondary | ICD-10-CM | POA: Diagnosis not present

## 2020-04-18 DIAGNOSIS — R262 Difficulty in walking, not elsewhere classified: Secondary | ICD-10-CM | POA: Diagnosis not present

## 2020-04-20 DIAGNOSIS — R2681 Unsteadiness on feet: Secondary | ICD-10-CM | POA: Diagnosis not present

## 2020-04-20 DIAGNOSIS — R262 Difficulty in walking, not elsewhere classified: Secondary | ICD-10-CM | POA: Diagnosis not present

## 2020-04-23 ENCOUNTER — Encounter: Payer: Self-pay | Admitting: Neurology

## 2020-04-23 DIAGNOSIS — R2681 Unsteadiness on feet: Secondary | ICD-10-CM | POA: Diagnosis not present

## 2020-04-23 DIAGNOSIS — R262 Difficulty in walking, not elsewhere classified: Secondary | ICD-10-CM | POA: Diagnosis not present

## 2020-04-24 DIAGNOSIS — F4322 Adjustment disorder with anxiety: Secondary | ICD-10-CM | POA: Diagnosis not present

## 2020-04-26 ENCOUNTER — Telehealth: Payer: Self-pay | Admitting: Neurology

## 2020-04-26 DIAGNOSIS — R2681 Unsteadiness on feet: Secondary | ICD-10-CM | POA: Diagnosis not present

## 2020-04-26 DIAGNOSIS — R262 Difficulty in walking, not elsewhere classified: Secondary | ICD-10-CM | POA: Diagnosis not present

## 2020-04-26 NOTE — Telephone Encounter (Signed)
[facility] is still waiting on the signed evaluation, pt will be put on hold until [place] receives that. Her contact info is ph#[phone]

## 2020-04-27 DIAGNOSIS — R2681 Unsteadiness on feet: Secondary | ICD-10-CM | POA: Diagnosis not present

## 2020-04-27 DIAGNOSIS — R262 Difficulty in walking, not elsewhere classified: Secondary | ICD-10-CM | POA: Diagnosis not present

## 2020-04-30 DIAGNOSIS — R2681 Unsteadiness on feet: Secondary | ICD-10-CM | POA: Diagnosis not present

## 2020-04-30 DIAGNOSIS — M25552 Pain in left hip: Secondary | ICD-10-CM | POA: Diagnosis not present

## 2020-04-30 DIAGNOSIS — R262 Difficulty in walking, not elsewhere classified: Secondary | ICD-10-CM | POA: Diagnosis not present

## 2020-04-30 NOTE — Telephone Encounter (Signed)
Order was signed and faxed back to [facility] on 04/26/2020.

## 2020-05-01 NOTE — Progress Notes (Signed)
Office Visit Note  Patient: Ashley Neal             Date of Birth: 1945-11-22           MRN: [id number]             PCP: Leighton Ruff, MD Referring: Leighton Ruff, MD Visit Date: 05/14/2020 Occupation: @GUAROCC @  Subjective:  Pain in hands and feet.   History of Present Illness: Ashley Neal is a 74 y.o. female with history of osteoarthritis and osteoporosis.  She states her osteoarthritis in her hands and feet has been bothersome.  She is having difficulty buttoning her clothes.  She is also trying to find shoes which she does not have the type.  She states she went to 2 over in [place] and she was on a bus for a long time which caused a lot of lower back pain and radiculopathy.  She was seen by her PCP who diagnosed her with degenerative disc disease of lumbar spine.  She is getting physical therapy at her home now.  States she was seen by a podiatrist and was given a cortisone injection on the right foot dorsum.  She states she had immediate relief with that.  Activities of Daily Living:  Patient reports morning stiffness for 24  hours.   Patient Denies nocturnal pain.  Difficulty dressing/grooming: Reports Difficulty climbing stairs: Reports Difficulty getting out of chair: Denies Difficulty using hands for taps, buttons, cutlery, and/or writing: Reports  Review of Systems  Constitutional: Negative for fatigue.  HENT: Positive for mouth dryness. Negative for mouth sores and nose dryness.   Eyes: Negative for itching and dryness.  Respiratory: Negative for shortness of breath, wheezing and difficulty breathing.   Cardiovascular: Negative for chest pain and palpitations.  Gastrointestinal: Negative for blood in stool, constipation and diarrhea.  Endocrine: Negative for increased urination.  Genitourinary: Negative for difficulty urinating and painful urination.  Musculoskeletal: Positive for arthralgias, joint pain, morning stiffness and muscle  tenderness.  Skin: Negative for color change, rash and redness.  Allergic/Immunologic: Negative for susceptible to infections.  Neurological: Positive for dizziness. Negative for numbness, headaches, memory loss and weakness.  Hematological: Positive for bruising/bleeding tendency.  Psychiatric/Behavioral: Negative for confusion and sleep disturbance.    PMFS History:  Patient Active Problem List   Diagnosis Date Noted  . Primary osteoarthritis of both hands 05/14/2020  . Primary osteoarthritis of both feet 05/14/2020  . Age-related osteoporosis without current pathological fracture 05/14/2020  . Prediabetes 05/14/2020  . Essential hypertension 05/14/2020  . History of hyperlipidemia 05/14/2020  . History of squamous cell carcinoma 05/14/2020  . Parkinsonism ([place]) 05/14/2020    Past Medical History:  Diagnosis Date  . Frequent headaches   . GERD (gastroesophageal reflux disease)   . High cholesterol   . Hyperlipidemia   . Hypertension   . Osteoarthritis   . Osteoporosis   . Ulcerative colitis ([place])     Family History  Problem Relation Age of Onset  . Endometrial cancer Mother   . Diabetes Mother   . Alzheimer's disease Father   . Heart disease Brother   . Other Brother        MVA   . Healthy Son   . Parkinson's disease Maternal Aunt   . COPD Sister   . Diabetes Sister   . Healthy Son   . Alcohol abuse Brother   . Heart disease Brother    Past Surgical History:  Procedure Laterality Date  .  CARPAL TUNNEL RELEASE Bilateral 1996/1997  . TUBAL LIGATION  1980's   Social History   Social History Narrative  . Not on file   Immunization History  Administered Date(s) Administered  . Janssen (J&J) SARS-COV-2 Vaccination 10/27/2019  . Tdap 07/11/2018  . Zoster Recombinat (Shingrix) 08/05/2018, 11/12/2018     Objective: Vital Signs: BP 113/74 (BP Location: Left Arm, Patient Position: Sitting, Cuff Size: Normal)   Pulse 84   Resp 14   Ht 5' 1"  (1.549 m)   Wt  143 lb 6.4 oz (65 kg)   BMI 27.10 kg/m    Physical Exam Vitals and nursing note reviewed.  Constitutional:      Appearance: She is well-developed.  HENT:     Head: Normocephalic and atraumatic.  Eyes:     Conjunctiva/sclera: Conjunctivae normal.  Cardiovascular:     Rate and Rhythm: Normal rate and regular rhythm.     Heart sounds: Normal heart sounds.  Pulmonary:     Effort: Pulmonary effort is normal.     Breath sounds: Normal breath sounds.  Abdominal:     General: Bowel sounds are normal.     Palpations: Abdomen is soft.  Musculoskeletal:     Cervical back: Normal range of motion.  Lymphadenopathy:     Cervical: No cervical adenopathy.  Skin:    General: Skin is warm and dry.     Capillary Refill: Capillary refill takes less than 2 seconds.  Neurological:     Mental Status: She is alert and oriented to person, place, and time.  Psychiatric:        Behavior: Behavior normal.      Musculoskeletal Exam: C-spine was in good range of motion.  Shoulder joints and elbow joints in good range of motion.  Wrist joints with good range of motion.  She has very limited range of motion of her PIPs and DIPs with mild inflammation in her right fourth PIP joint.  Hip joints, knee joints, ankles, MTPs and PIPs with good range of motion with no synovitis.  Osteoarthritic changes in  PIPs and DIPs of her feet.   CDAI Exam: CDAI Score: -- Patient Global: --; Provider Global: -- Swollen: --; Tender: -- Joint Exam 05/14/2020   No joint exam has been documented for this visit   There is currently no information documented on the homunculus. Go to the Rheumatology activity and complete the homunculus joint exam.  Investigation: No additional findings.  Imaging: No results found.  Recent Labs: Lab Results  Component Value Date   WBC 5.3 09/18/2014   HGB 14.1 09/18/2014   PLT 227 09/18/2014   NA 142 09/18/2014   K 3.7 09/18/2014   CL 106 09/18/2014   CO2 28 09/18/2014    GLUCOSE 98 09/18/2014   BUN 9 09/18/2014   CREATININE 0.87 09/18/2014   CALCIUM 9.9 09/18/2014   GFRAA 78 (L) 09/18/2014    Speciality Comments: No specialty comments available.  Procedures:  No procedures performed Allergies: Codeine and Tape   Assessment / Plan:     Visit Diagnoses: Primary osteoarthritis of both hands - Severe osteoarthritis with incomplete fist formation. All autoimmune work-up was negative.  She is having difficulty doing routine activities.  She is unable to make a fist.  I will refer her to occupational therapy and physical therapy at [facility].  Have given her handout on hand exercises.  Primary osteoarthritis of both feet-proper fitting shoes were discussed.  She is not having any discomfort in her feet.  Age-related osteoporosis without current pathological fracture - Patient was treated with Fosamax in the past which she discontinued due to dental problems.  She does not want any further treatment or DEXA  Prediabetes  Essential hypertension  History of hyperlipidemia  History of squamous cell carcinoma  Parkinsonism, unspecified Parkinsonism type ([place])  Family history of Crohn's disease  Family history of psoriasis in father  Educated about COVID-90 virus infection-she is fully vaccinated against COVID-19.  Use of mask, social distancing and hand hygiene was discussed.  A booster was advised when it is available to her.  Orders: No orders of the defined types were placed in this encounter.  No orders of the defined types were placed in this encounter.     Follow-Up Instructions: Return in about 6 months (around 11/12/2020) for Osteoarthritis.   Bo Merino, MD  Note - This record has been created using Editor, commissioning.  Chart creation errors have been sought, but may not always  have been located. Such creation errors do not reflect on  the standard of medical care.

## 2020-05-03 ENCOUNTER — Other Ambulatory Visit: Payer: Self-pay | Admitting: Family Medicine

## 2020-05-03 DIAGNOSIS — Z1231 Encounter for screening mammogram for malignant neoplasm of breast: Secondary | ICD-10-CM

## 2020-05-03 DIAGNOSIS — R262 Difficulty in walking, not elsewhere classified: Secondary | ICD-10-CM | POA: Diagnosis not present

## 2020-05-03 DIAGNOSIS — R2681 Unsteadiness on feet: Secondary | ICD-10-CM | POA: Diagnosis not present

## 2020-05-03 DIAGNOSIS — M25552 Pain in left hip: Secondary | ICD-10-CM | POA: Diagnosis not present

## 2020-05-07 DIAGNOSIS — R2681 Unsteadiness on feet: Secondary | ICD-10-CM | POA: Diagnosis not present

## 2020-05-07 DIAGNOSIS — R262 Difficulty in walking, not elsewhere classified: Secondary | ICD-10-CM | POA: Diagnosis not present

## 2020-05-07 DIAGNOSIS — M25552 Pain in left hip: Secondary | ICD-10-CM | POA: Diagnosis not present

## 2020-05-08 DIAGNOSIS — F4322 Adjustment disorder with anxiety: Secondary | ICD-10-CM | POA: Diagnosis not present

## 2020-05-09 DIAGNOSIS — M25552 Pain in left hip: Secondary | ICD-10-CM | POA: Diagnosis not present

## 2020-05-09 DIAGNOSIS — R2681 Unsteadiness on feet: Secondary | ICD-10-CM | POA: Diagnosis not present

## 2020-05-09 DIAGNOSIS — R262 Difficulty in walking, not elsewhere classified: Secondary | ICD-10-CM | POA: Diagnosis not present

## 2020-05-14 ENCOUNTER — Encounter: Payer: Self-pay | Admitting: Rheumatology

## 2020-05-14 ENCOUNTER — Other Ambulatory Visit: Payer: Self-pay

## 2020-05-14 ENCOUNTER — Ambulatory Visit (INDEPENDENT_AMBULATORY_CARE_PROVIDER_SITE_OTHER): Payer: Medicare Other | Admitting: Rheumatology

## 2020-05-14 VITALS — BP 113/74 | HR 84 | Resp 14 | Ht 61.0 in | Wt 143.4 lb

## 2020-05-14 DIAGNOSIS — M19072 Primary osteoarthritis, left ankle and foot: Secondary | ICD-10-CM

## 2020-05-14 DIAGNOSIS — M19042 Primary osteoarthritis, left hand: Secondary | ICD-10-CM

## 2020-05-14 DIAGNOSIS — R7303 Prediabetes: Secondary | ICD-10-CM

## 2020-05-14 DIAGNOSIS — M25552 Pain in left hip: Secondary | ICD-10-CM | POA: Diagnosis not present

## 2020-05-14 DIAGNOSIS — I10 Essential (primary) hypertension: Secondary | ICD-10-CM | POA: Diagnosis not present

## 2020-05-14 DIAGNOSIS — M19071 Primary osteoarthritis, right ankle and foot: Secondary | ICD-10-CM

## 2020-05-14 DIAGNOSIS — G20 Parkinson's disease: Secondary | ICD-10-CM

## 2020-05-14 DIAGNOSIS — Z7189 Other specified counseling: Secondary | ICD-10-CM

## 2020-05-14 DIAGNOSIS — Z840 Family history of diseases of the skin and subcutaneous tissue: Secondary | ICD-10-CM | POA: Diagnosis not present

## 2020-05-14 DIAGNOSIS — Z8589 Personal history of malignant neoplasm of other organs and systems: Secondary | ICD-10-CM

## 2020-05-14 DIAGNOSIS — Z8379 Family history of other diseases of the digestive system: Secondary | ICD-10-CM

## 2020-05-14 DIAGNOSIS — R2681 Unsteadiness on feet: Secondary | ICD-10-CM | POA: Diagnosis not present

## 2020-05-14 DIAGNOSIS — G20C Parkinsonism, unspecified: Secondary | ICD-10-CM

## 2020-05-14 DIAGNOSIS — M19041 Primary osteoarthritis, right hand: Secondary | ICD-10-CM | POA: Diagnosis not present

## 2020-05-14 DIAGNOSIS — Z8639 Personal history of other endocrine, nutritional and metabolic disease: Secondary | ICD-10-CM

## 2020-05-14 DIAGNOSIS — M810 Age-related osteoporosis without current pathological fracture: Secondary | ICD-10-CM | POA: Diagnosis not present

## 2020-05-14 DIAGNOSIS — R262 Difficulty in walking, not elsewhere classified: Secondary | ICD-10-CM | POA: Diagnosis not present

## 2020-05-14 NOTE — Patient Instructions (Signed)
Hand Exercises Hand exercises can be helpful for almost anyone. These exercises can strengthen the hands, improve flexibility and movement, and increase blood flow to the hands. These results can make work and daily tasks easier. Hand exercises can be especially helpful for people who have joint pain from arthritis or have nerve damage from overuse (carpal tunnel syndrome). These exercises can also help people who have injured a hand. Exercises Most of these hand exercises are gentle stretching and motion exercises. It is usually safe to do them often throughout the day. Warming up your hands before exercise may help to reduce stiffness. You can do this with gentle massage or by placing your hands in warm water for 10-15 minutes. It is normal to feel some stretching, pulling, tightness, or mild discomfort as you begin new exercises. This will gradually improve. Stop an exercise right away if you feel sudden, severe pain or your pain gets worse. Ask your health care provider which exercises are best for you. Knuckle bend or "claw" fist 1. Stand or sit with your arm, hand, and all five fingers pointed straight up. Make sure to keep your wrist straight during the exercise. 2. Gently bend your fingers down toward your palm until the tips of your fingers are touching the top of your palm. Keep your big knuckle straight and just bend the small knuckles in your fingers. 3. Hold this position for __________ seconds. 4. Straighten (extend) your fingers back to the starting position. Repeat this exercise 5-10 times with each hand. Full finger fist 1. Stand or sit with your arm, hand, and all five fingers pointed straight up. Make sure to keep your wrist straight during the exercise. 2. Gently bend your fingers into your palm until the tips of your fingers are touching the middle of your palm. 3. Hold this position for __________ seconds. 4. Extend your fingers back to the starting position, stretching every  joint fully. Repeat this exercise 5-10 times with each hand. Straight fist 1. Stand or sit with your arm, hand, and all five fingers pointed straight up. Make sure to keep your wrist straight during the exercise. 2. Gently bend your fingers at the big knuckle, where your fingers meet your hand, and the middle knuckle. Keep the knuckle at the tips of your fingers straight and try to touch the bottom of your palm. 3. Hold this position for __________ seconds. 4. Extend your fingers back to the starting position, stretching every joint fully. Repeat this exercise 5-10 times with each hand. Tabletop 1. Stand or sit with your arm, hand, and all five fingers pointed straight up. Make sure to keep your wrist straight during the exercise. 2. Gently bend your fingers at the big knuckle, where your fingers meet your hand, as far down as you can while keeping the small knuckles in your fingers straight. Think of forming a tabletop with your fingers. 3. Hold this position for __________ seconds. 4. Extend your fingers back to the starting position, stretching every joint fully. Repeat this exercise 5-10 times with each hand. Finger spread 1. Place your hand flat on a table with your palm facing down. Make sure your wrist stays straight as you do this exercise. 2. Spread your fingers and thumb apart from each other as far as you can until you feel a gentle stretch. Hold this position for __________ seconds. 3. Bring your fingers and thumb tight together again. Hold this position for __________ seconds. Repeat this exercise 5-10 times with each hand.  Making circles 1. Stand or sit with your arm, hand, and all five fingers pointed straight up. Make sure to keep your wrist straight during the exercise. 2. Make a circle by touching the tip of your thumb to the tip of your index finger. 3. Hold for __________ seconds. Then open your hand wide. 4. Repeat this motion with your thumb and each finger on your  hand. Repeat this exercise 5-10 times with each hand. Thumb motion 1. Sit with your forearm resting on a table and your wrist straight. Your thumb should be facing up toward the ceiling. Keep your fingers relaxed as you move your thumb. 2. Lift your thumb up as high as you can toward the ceiling. Hold for __________ seconds. 3. Bend your thumb across your palm as far as you can, reaching the tip of your thumb for the small finger (pinkie) side of your palm. Hold for __________ seconds. Repeat this exercise 5-10 times with each hand. Grip strengthening  1. Hold a stress ball or other soft ball in the middle of your hand. 2. Slowly increase the pressure, squeezing the ball as much as you can without causing pain. Think of bringing the tips of your fingers into the middle of your palm. All of your finger joints should bend when doing this exercise. 3. Hold your squeeze for __________ seconds, then relax. Repeat this exercise 5-10 times with each hand. Contact a health care provider if:  Your hand pain or discomfort gets much worse when you do an exercise.  Your hand pain or discomfort does not improve within 2 hours after you exercise. If you have any of these problems, stop doing these exercises right away. Do not do them again unless your health care provider says that you can. Get help right away if:  You develop sudden, severe hand pain or swelling. If this happens, stop doing these exercises right away. Do not do them again unless your health care provider says that you can. This information is not intended to replace advice given to you by your health care provider. Make sure you discuss any questions you have with your health care provider. Document Revised: 11/18/2018 Document Reviewed: 07/29/2018 Elsevier Patient Education  [place].

## 2020-05-17 DIAGNOSIS — M25552 Pain in left hip: Secondary | ICD-10-CM | POA: Diagnosis not present

## 2020-05-17 DIAGNOSIS — R2681 Unsteadiness on feet: Secondary | ICD-10-CM | POA: Diagnosis not present

## 2020-05-17 DIAGNOSIS — R262 Difficulty in walking, not elsewhere classified: Secondary | ICD-10-CM | POA: Diagnosis not present

## 2020-05-21 DIAGNOSIS — R2681 Unsteadiness on feet: Secondary | ICD-10-CM | POA: Diagnosis not present

## 2020-05-21 DIAGNOSIS — M25552 Pain in left hip: Secondary | ICD-10-CM | POA: Diagnosis not present

## 2020-05-21 DIAGNOSIS — R262 Difficulty in walking, not elsewhere classified: Secondary | ICD-10-CM | POA: Diagnosis not present

## 2020-05-22 DIAGNOSIS — F4322 Adjustment disorder with anxiety: Secondary | ICD-10-CM | POA: Diagnosis not present

## 2020-05-24 DIAGNOSIS — R2681 Unsteadiness on feet: Secondary | ICD-10-CM | POA: Diagnosis not present

## 2020-05-24 DIAGNOSIS — R262 Difficulty in walking, not elsewhere classified: Secondary | ICD-10-CM | POA: Diagnosis not present

## 2020-05-24 DIAGNOSIS — M25552 Pain in left hip: Secondary | ICD-10-CM | POA: Diagnosis not present

## 2020-06-05 DIAGNOSIS — R2681 Unsteadiness on feet: Secondary | ICD-10-CM | POA: Diagnosis not present

## 2020-06-05 DIAGNOSIS — M25552 Pain in left hip: Secondary | ICD-10-CM | POA: Diagnosis not present

## 2020-06-05 DIAGNOSIS — R262 Difficulty in walking, not elsewhere classified: Secondary | ICD-10-CM | POA: Diagnosis not present

## 2020-06-05 DIAGNOSIS — F4322 Adjustment disorder with anxiety: Secondary | ICD-10-CM | POA: Diagnosis not present

## 2020-06-07 DIAGNOSIS — R262 Difficulty in walking, not elsewhere classified: Secondary | ICD-10-CM | POA: Diagnosis not present

## 2020-06-07 DIAGNOSIS — R2681 Unsteadiness on feet: Secondary | ICD-10-CM | POA: Diagnosis not present

## 2020-06-07 DIAGNOSIS — M25552 Pain in left hip: Secondary | ICD-10-CM | POA: Diagnosis not present

## 2020-06-12 DIAGNOSIS — R2681 Unsteadiness on feet: Secondary | ICD-10-CM | POA: Diagnosis not present

## 2020-06-12 DIAGNOSIS — R262 Difficulty in walking, not elsewhere classified: Secondary | ICD-10-CM | POA: Diagnosis not present

## 2020-06-12 DIAGNOSIS — M25552 Pain in left hip: Secondary | ICD-10-CM | POA: Diagnosis not present

## 2020-06-14 DIAGNOSIS — R262 Difficulty in walking, not elsewhere classified: Secondary | ICD-10-CM | POA: Diagnosis not present

## 2020-06-14 DIAGNOSIS — M25552 Pain in left hip: Secondary | ICD-10-CM | POA: Diagnosis not present

## 2020-06-14 DIAGNOSIS — R2681 Unsteadiness on feet: Secondary | ICD-10-CM | POA: Diagnosis not present

## 2020-06-18 ENCOUNTER — Ambulatory Visit
Admission: RE | Admit: 2020-06-18 | Discharge: 2020-06-18 | Disposition: A | Discharge status: Home / Self Care | Payer: Medicare Other | Source: Ambulatory Visit | Attending: Family Medicine | Admitting: Family Medicine

## 2020-06-18 ENCOUNTER — Other Ambulatory Visit: Payer: Self-pay

## 2020-06-18 DIAGNOSIS — R262 Difficulty in walking, not elsewhere classified: Secondary | ICD-10-CM | POA: Diagnosis not present

## 2020-06-18 DIAGNOSIS — M25552 Pain in left hip: Secondary | ICD-10-CM | POA: Diagnosis not present

## 2020-06-18 DIAGNOSIS — Z1231 Encounter for screening mammogram for malignant neoplasm of breast: Secondary | ICD-10-CM | POA: Diagnosis not present

## 2020-06-18 DIAGNOSIS — R2681 Unsteadiness on feet: Secondary | ICD-10-CM | POA: Diagnosis not present

## 2020-06-19 DIAGNOSIS — F4322 Adjustment disorder with anxiety: Secondary | ICD-10-CM | POA: Diagnosis not present

## 2020-06-21 DIAGNOSIS — M25552 Pain in left hip: Secondary | ICD-10-CM | POA: Diagnosis not present

## 2020-06-21 DIAGNOSIS — R2681 Unsteadiness on feet: Secondary | ICD-10-CM | POA: Diagnosis not present

## 2020-06-21 DIAGNOSIS — R262 Difficulty in walking, not elsewhere classified: Secondary | ICD-10-CM | POA: Diagnosis not present

## 2020-06-25 DIAGNOSIS — R2681 Unsteadiness on feet: Secondary | ICD-10-CM | POA: Diagnosis not present

## 2020-06-25 DIAGNOSIS — M25552 Pain in left hip: Secondary | ICD-10-CM | POA: Diagnosis not present

## 2020-06-25 DIAGNOSIS — R262 Difficulty in walking, not elsewhere classified: Secondary | ICD-10-CM | POA: Diagnosis not present

## 2020-06-26 DIAGNOSIS — R2681 Unsteadiness on feet: Secondary | ICD-10-CM | POA: Diagnosis not present

## 2020-06-26 DIAGNOSIS — R262 Difficulty in walking, not elsewhere classified: Secondary | ICD-10-CM | POA: Diagnosis not present

## 2020-06-26 DIAGNOSIS — M25552 Pain in left hip: Secondary | ICD-10-CM | POA: Diagnosis not present

## 2020-07-03 DIAGNOSIS — F4322 Adjustment disorder with anxiety: Secondary | ICD-10-CM | POA: Diagnosis not present

## 2020-07-09 DIAGNOSIS — R2681 Unsteadiness on feet: Secondary | ICD-10-CM | POA: Diagnosis not present

## 2020-07-09 DIAGNOSIS — R262 Difficulty in walking, not elsewhere classified: Secondary | ICD-10-CM | POA: Diagnosis not present

## 2020-07-09 DIAGNOSIS — M25552 Pain in left hip: Secondary | ICD-10-CM | POA: Diagnosis not present

## 2020-07-12 DIAGNOSIS — R2681 Unsteadiness on feet: Secondary | ICD-10-CM | POA: Diagnosis not present

## 2020-07-12 DIAGNOSIS — M25552 Pain in left hip: Secondary | ICD-10-CM | POA: Diagnosis not present

## 2020-07-12 DIAGNOSIS — R262 Difficulty in walking, not elsewhere classified: Secondary | ICD-10-CM | POA: Diagnosis not present

## 2020-07-16 DIAGNOSIS — R262 Difficulty in walking, not elsewhere classified: Secondary | ICD-10-CM | POA: Diagnosis not present

## 2020-07-16 DIAGNOSIS — R2681 Unsteadiness on feet: Secondary | ICD-10-CM | POA: Diagnosis not present

## 2020-07-16 DIAGNOSIS — M25552 Pain in left hip: Secondary | ICD-10-CM | POA: Diagnosis not present

## 2020-07-17 DIAGNOSIS — F4322 Adjustment disorder with anxiety: Secondary | ICD-10-CM | POA: Diagnosis not present

## 2020-07-19 DIAGNOSIS — R2681 Unsteadiness on feet: Secondary | ICD-10-CM | POA: Diagnosis not present

## 2020-07-19 DIAGNOSIS — M25552 Pain in left hip: Secondary | ICD-10-CM | POA: Diagnosis not present

## 2020-07-19 DIAGNOSIS — R262 Difficulty in walking, not elsewhere classified: Secondary | ICD-10-CM | POA: Diagnosis not present

## 2020-07-23 DIAGNOSIS — F418 Other specified anxiety disorders: Secondary | ICD-10-CM | POA: Diagnosis not present

## 2020-07-23 DIAGNOSIS — E7800 Pure hypercholesterolemia, unspecified: Secondary | ICD-10-CM | POA: Diagnosis not present

## 2020-07-23 DIAGNOSIS — R7303 Prediabetes: Secondary | ICD-10-CM | POA: Diagnosis not present

## 2020-07-23 DIAGNOSIS — F329 Major depressive disorder, single episode, unspecified: Secondary | ICD-10-CM | POA: Diagnosis not present

## 2020-07-23 DIAGNOSIS — K219 Gastro-esophageal reflux disease without esophagitis: Secondary | ICD-10-CM | POA: Diagnosis not present

## 2020-07-23 DIAGNOSIS — I10 Essential (primary) hypertension: Secondary | ICD-10-CM | POA: Diagnosis not present

## 2020-07-23 DIAGNOSIS — G20 Parkinson's disease: Secondary | ICD-10-CM | POA: Diagnosis not present

## 2020-07-31 DIAGNOSIS — F4322 Adjustment disorder with anxiety: Secondary | ICD-10-CM | POA: Diagnosis not present

## 2020-08-01 DIAGNOSIS — H903 Sensorineural hearing loss, bilateral: Secondary | ICD-10-CM | POA: Diagnosis not present

## 2020-10-08 ENCOUNTER — Encounter: Payer: Self-pay | Admitting: Neurology

## 2020-10-08 ENCOUNTER — Ambulatory Visit: Payer: PPO | Admitting: Neurology

## 2020-10-08 VITALS — BP 151/78 | HR 70 | Ht 61.0 in | Wt 138.0 lb

## 2020-10-08 DIAGNOSIS — I998 Other disorder of circulatory system: Secondary | ICD-10-CM | POA: Diagnosis not present

## 2020-10-08 DIAGNOSIS — G20 Parkinson's disease: Secondary | ICD-10-CM | POA: Diagnosis not present

## 2020-10-08 DIAGNOSIS — I493 Ventricular premature depolarization: Secondary | ICD-10-CM | POA: Diagnosis not present

## 2020-10-08 DIAGNOSIS — G20C Parkinsonism, unspecified: Secondary | ICD-10-CM

## 2020-10-08 NOTE — Patient Instructions (Signed)
We will continue with the levodopa at the current dose, 1 pill 4 times a day.  Please continue to monitor your symptoms and your blood pressure.  You have had some PVCs, I noticed them today on heart auscultation.  It may be worthwhile making an appointment with your new primary care to discuss evaluation through cardiologist next.  We will fill out a DMV placard form.

## 2020-10-08 NOTE — Progress Notes (Signed)
Subjective:    Ashley Ashley: Ashley Ashley is a 75 y.o. female.  HPI     Interim history:  Ashley Ashley is a 75 year old right-handed woman with an underlying medical history of hypertension, hyperlipidemia, history of colitis, osteoarthritis, osteoporosis, reflux disease, anxiety and obesity, who presents for follow-up consultation of Ashley Ashley right sided parkinsonism. Ashley Ashley is unaccompanied today. I last saw Ashley Ashley on 04/02/2020, at which time, she reported worsening tremors and posture was worse.  She had stopped taking Ashley Ashley because she did not have much in Ashley way of restless leg symptoms anymore.  She was planning a trip to [place] in October 2021.  She was referred to physical therapy and we increased Ashley Ashley Ashley to 1 pill 4 times a day.  She emailed back a week or so later reporting that Ashley Ashley was helpful at Ashley increased dose.     Today, 10/08/2020: She reports that she has noticed more blood pressure fluctuations.  Sometimes it is higher and she feels unwell with it and sometimes it is low in Ashley Ashley over higher 40s even and she feels dizzy.  She has been monitoring Ashley Ashley blood pressure, also when she was visiting [place] friend in [place].  She has had multiple trips to [place] and he has also visited [place] here.  He usually drives here and she usually flies unless she drives back with him.  She is also planning to attend a high school reunion in [place].  She has not had any falls, physical therapy was helpful, [facility]  She tries to hydrate well.  She tries to take Ashley Ashley regularly.  Sometimes she has some delay or takes it early, she does not have much in Ashley way of interference with Ashley Ashley meals.  She has been a vegetarian for over 60 years.  Ashley Ashley's allergies, current medications, family history, past medical history, past social history, past surgical history and problem list were reviewed and updated as appropriate.       Previously:     I saw Ashley Ashley on 10/04/2019, at which time she reported Some postural instability.  She felt that she had more tremor.  She had worsening urinary incontinence but constipation was better.  She was encouraged to see urology.  She endorsed some restlessness particularly affecting Ashley Ashley feet at night.  She was advised to start low-dose Ashley particularly for restless leg symptoms.     I saw Ashley Ashley on 03/31/2019, at which time, she reported overall doing well, sometimes she had some more gait instability. She was active, but not able to pursue any of Ashley Ashley group exercises including cardio drumming.  She had issues with constipation. She had bilateral foot pain, and was supposed to see a podiatrist soon. She was considering CBD oil for arthritis and pain.       I saw Ashley Ashley on 09/29/2018, at which time she reported that she tried tai chi but did not enjoy it.  She looked into boxing classes but decided not to pursue it, she did start cardio drumming which she was enjoying as well as chair yoga.  She had occasional symptoms of restless legs.  She felt stable motor wise, she had an occasional flareup in Ashley right hand tremor, rare constipation issues.  She was advised to continue with Ashley 1 pill 3 times daily.    I saw Ashley Ashley on 03/29/2018 at which time she reported doing okay. She was taking Ashley 3 times a  day but sometimes would forget Ashley midday dose. Is trying to exercise on a regular basis, would go to [place] about 3 days of Ashley week. 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 I suggested she continue with Ashley 3 times a day.   I saw Ashley Ashley on 11/11/2017, at which time she reported diffuse aches and pains. She had seen orthopedics. She had blood work with PCP. She had seen Dr. Carles Collet twice. She was trying to exercise. She was advised to follow-up routinely and advised that we would continue to observe Ashley Ashley and follow clinically.   She called in Ashley interim in late April after she  had seen rheumatology. She was advised to start Ashley with gradual titration at Ashley time.   I first met Ashley Ashley on 04/16/2017 at Ashley Ashley of Ashley Ashley primary care physician, at which time she reported a several month history of intermittent right hand tremors and also additional symptoms including difficulty turning over in bed or certain movements when dancing. I suggested observation and a 6 month follow-up. I did suggest we could proceed with a brain MRI but she declined as she was highly claustrophobic and simple anti-anxiety medication would not help. She had interim appointments with Dr. Carles Collet on 06/09/17 and again on 10/20/17. I reviewed Ashley Ashley. Potential symptomatic medication was discussed, a PET scan was also discussed.      04/16/2017: (She) reports a several month history of a intermittent right hand tremor. She also reports difficulty with certain movements such as turning over in bed or certain movements when dancing. I reviewed your office note from 04/09/2017, which you kindly included. She reports recent weight loss without particularly trying to lose weight. Blood work through your office from 04/02/2017 was reviewed revealing normal CMP with a glucose level of 105, total cholesterol of 107, triglycerides 171, LDL 126. Recent A1c on 04/09/2017 was borderline at 5.8. CBC with differential was normal, B12 434. N TSH and N CK level on 04/09/17.  No FHx of PD or ET, father had dementia and lived to be 72, mom died at 54 from metastatic endom cancer. One brother with asthma, 2 sister, younger brother with EtOH abuse, youngest brother died at 62 yo in a car accident.  She has R shoulder problems, has had back pain in Ashley mid back. Has had dexterity issue. She saw ortho at [place], and has been to [place] for PT.  She is doing exercises. 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  Has retired recently, 2 grown sons, one of them local, [place].  She is a non-smoker.  Ashley Ashley  Past Medical History Is Significant For: Past Medical History:  Diagnosis Date  . Frequent headaches   . GERD (gastroesophageal reflux disease)   . High cholesterol   . Hyperlipidemia   . Hypertension   . Osteoarthritis   . Osteoporosis   . Ulcerative colitis ([place])     Ashley Ashley Past Surgical History Is Significant For: Past Surgical History:  Procedure Laterality Date  . CARPAL TUNNEL RELEASE Bilateral 1996/1997  . TUBAL LIGATION  1980's    Ashley Ashley Family History Is Significant For: Family History  Problem Relation Age of Onset  . Endometrial cancer Mother   . Diabetes Mother   . Alzheimer's disease Father   . Heart disease Brother   . Other Brother        MVA   . Healthy Son   . Parkinson's disease Maternal Aunt   .  COPD Sister   . Diabetes Sister   . Healthy Son   . Alcohol abuse Brother   . Heart disease Brother     Ashley Ashley Social History Is Significant For: Social History   Socioeconomic History  . Marital status: Divorced    Spouse name: Not on file  . Number of children: Not on file  . Years of education: Not on file  . Highest education level: Not on file  Occupational History  . Occupation: retired    Comment: Chiropractor  Tobacco Use  . Smoking status: Never Smoker  . Smokeless tobacco: Never Used  Vaping Use  . Vaping Use: Never used  Substance and Sexual Activity  . Alcohol use: Yes    Comment: rare  . Drug use: No  . Sexual activity: Not on file  Other Topics Concern  . Not on file  Social History Narrative  . Not on file   Social Determinants of Health   Financial Resource Strain: Not on file  Food Insecurity: Not on file  Transportation Needs: Not on file  Physical Activity: Not on file  Stress: Not on file  Social Connections: Not on file    Ashley Ashley Allergies Are:  Allergies  Allergen Reactions  . Codeine Other (See Comments)    unknown  . Tape Other (See Comments)    unknown  :   Ashley Ashley Current Medications Are:  Outpatient  Encounter Medications as of 10/08/2020  Medication Sig  . aspirin EC 81 MG tablet Take 81 mg by mouth daily.  . carbidopa-levodopa (Ashley IR) 25-100 MG tablet Take 1 tablet by mouth 4 (four) times daily.  Marland Kitchen lisinopril (ZESTRIL) 10 MG tablet Take 10 mg by mouth daily.  Marland Kitchen MAGNESIUM PO Take by mouth daily.  . Multiple Vitamin (MULTIVITAMIN) tablet Take 1 tablet by mouth daily.  . pramipexole (Ashley) 0.125 MG tablet Take 1 tablet (0.125 mg total) by mouth at bedtime. (Ashley taking differently: Take 0.125 mg by mouth as needed.)  . [DISCONTINUED] lisinopril-hydrochlorothiazide (PRINZIDE,ZESTORETIC) 10-12.5 MG tablet Take 1 tablet by mouth daily. (Ashley not taking: Reported on 05/14/2020)  . [DISCONTINUED] nystatin (MYCOSTATIN) 100000 UNIT/ML suspension RINSE WITH 5ML FOR 2 MIN FOUR TO FIVE TIMES THEN SPIT (Ashley not taking: Reported on 04/02/2020)   No facility-administered encounter medications on file as of 10/08/2020.  :  Review of Systems:  Out of a complete 14 point review of systems, all are reviewed and negative with Ashley exception of these symptoms as listed below: Review of Systems  Neurological:       Here for f/u on PD, reports she has been doing well since last visit. No falls.    Objective:  Neurological Exam  Physical Exam Physical Examination:   Vitals:   10/08/20 1028  BP: (!) 151/78  Pulse: 70    General Examination: Ashley Ashley is a very pleasant 75 y.o. female in no acute distress. She appears well-developed and well-nourished and well groomed.   HEENT:Normocephalic, atraumatic, pupils are equal, round and reactive to light, corrective eyeglasses in place. Extraocular tracking is good without limitation to gaze excursion or nystagmus noted. Normal smooth pursuit is noted. Hearing is grossly intact. Face is symmetric with normal facial sensation andmild facial maskingnoted. Speech is clear, no dysarthria, no sialorrhea. She hasmildhypophonia.Neck is mildly  rigid with good range of motion.She hasa benign airway exam, mild mouth dryness noted.  Chest, heart, abdomen and extremities:  Chest is clear to auscultation, heart sounds normal, but she does have  intermittent extra beats with subsequent pauses noted.   Thereare prominentarthritic changes in both hands. Puffy ankles but not in Ashley realm of pitting edema, all stable.   Skin: Warm and dry without trophic changes noted.  Musculoskeletal:Good range of motion, difficulty with hand movements secondary to arthritis and trigger finger-like changes, stable.  Neurologically:  Mental status: Ashley Ashley is awake, alert and oriented in all 4 spheres.Herimmediate and remote memory, attention, language skills and fund of knowledge are appropriate. There is no evidence of aphasia, agnosia, apraxia or anomia. Speech is clear with normal prosody and enunciation. Thought process is linear. Mood is normaland affect is normal.  Cranial nerves II - XII are as described above under HEENT exam.Left shoulder isslightly higher than right, stable. Motor exam: Normal bulk,and strength is noted for age. She hasa mildincrease in tone in Ashley right upper extremity, stable.  She has a mild and intermittent resting tremor in Ashley right upper extremity.  Overall, mild slowness in movements.  (On9/01/2017:On Archimedes spiral drawing there is no significant trembling noted. Ashley Ashley handwriting is not tremulous and legible, slightly micrographic.)  Fine motor skills and coordination: She has mild difficulty on Ashley rightUE and LE. Cerebellar testing: No dysmetria or intention tremor on finger to nose testing. There is no truncal or gait ataxia.  Sensory exam: intact to light touchinthe upper and lower extremities.  Gait, station and balance:Shestands easily. No veering to one side is noted. No leaning to one side is noted. Posture ismildlystooped, stable, Stance is narrow based.she walks with good  pace, fairly good stride length,milddecrease in arm swing on Ashley right> left, she turnsin 3 steps.Balance is preserved.  Assessmentand Plan:   In summary,Ashley V Trainoris a very pleasant 69 year oldfemalewith an underlying medical history of hypertension, hyperlipidemia, history of colitis, osteoarthritis, osteoporosis, reflux disease, anxiety and obesity, whopresents for follow-up consultation of Ashley Ashley right-sided parkinsonism, most likely right-sided predominant Parkinson's disease.She has benefited from Ashley generic.  We increased it from 3 times daily to 4 times a day in August 2021.  Of note, she had a second opinion with Dr. Carles Collet in Ashley past.  She has had some more changes in Ashley Ashley posture and walking and also tremor.  She has benefited from Ashley increase in Ashley.  She has been mindful of Ashley Ashley hydration and constipation issues.  She has noticed some more blood pressure fluctuations lately.  She is advised to monitor his symptoms and Ashley Ashley blood pressure.  She is encouraged to change positions slowly and drink plenty of water.  She has not yet established with Ashley Ashley new primary care physician.  She had some PVCs on auscultation today and has a prior history of PVCs.  It may be worthwhile getting checked out for these by Ashley Ashley primary care and consider a cardiology referral as well.   She has recently benefited from physical therapy.  She continues to stay active mentally and physically.  She is advised to continue with Ashley 1 pill 4 times a day and follow-up routinely in this clinic in 6 months, sooner if needed.  She is advised to call us or email through [place] for any interim questions or concerns.  She requested a disability placard through [facility], I filled out Ashley form for this today. I answered all Ashley Ashley questions today and she was in agreement.   I spent 30 minutes in total face-to-face time and in reviewing records during pre-charting, more than 50% of which was spent in counseling  and coordination of care,  reviewing test results, reviewing medications and treatment regimen and/or in discussing or reviewing Ashley diagnosis of PD, Ashley prognosis and treatment options. Pertinent laboratory and imaging test results that were available during this visit with Ashley Ashley were reviewed by me and considered in my medical decision making (see chart for details).

## 2020-10-09 DIAGNOSIS — R42 Dizziness and giddiness: Secondary | ICD-10-CM | POA: Diagnosis not present

## 2020-10-09 DIAGNOSIS — R0789 Other chest pain: Secondary | ICD-10-CM | POA: Diagnosis not present

## 2020-10-09 DIAGNOSIS — I10 Essential (primary) hypertension: Secondary | ICD-10-CM | POA: Diagnosis not present

## 2020-10-09 DIAGNOSIS — G20 Parkinson's disease: Secondary | ICD-10-CM | POA: Diagnosis not present

## 2020-10-10 IMAGING — MG DIGITAL SCREENING BILATERAL MAMMOGRAM WITH TOMO AND CAD
8 series · 9 of 24 positions shown · non-contrast
Comparison: Previous exam(s).

CLINICAL DATA: Screening.

EXAM:
DIGITAL SCREENING BILATERAL MAMMOGRAM WITH TOMO AND CAD

[R CC synth-2D]
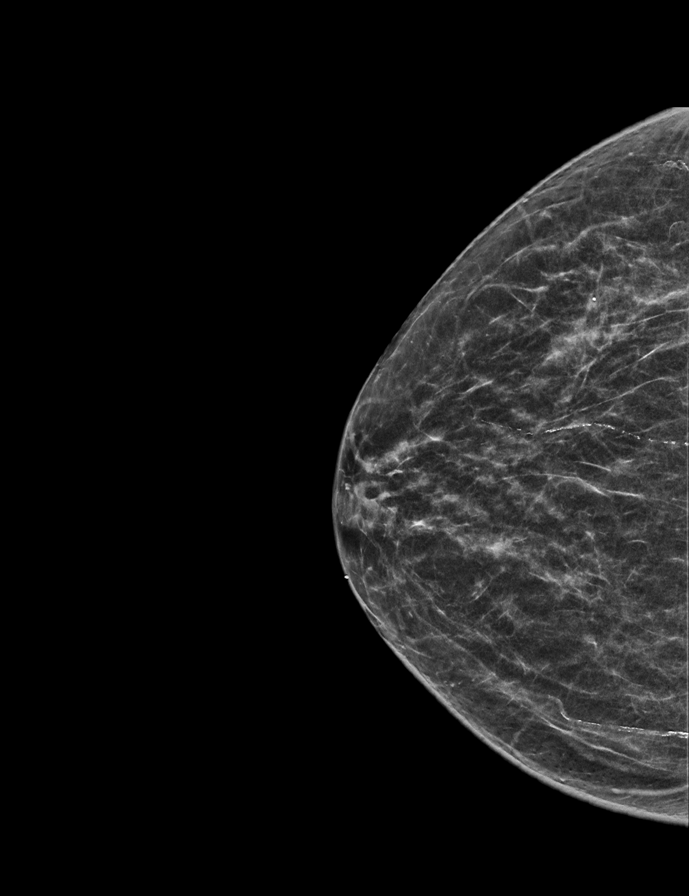

[R MLO synth-2D]
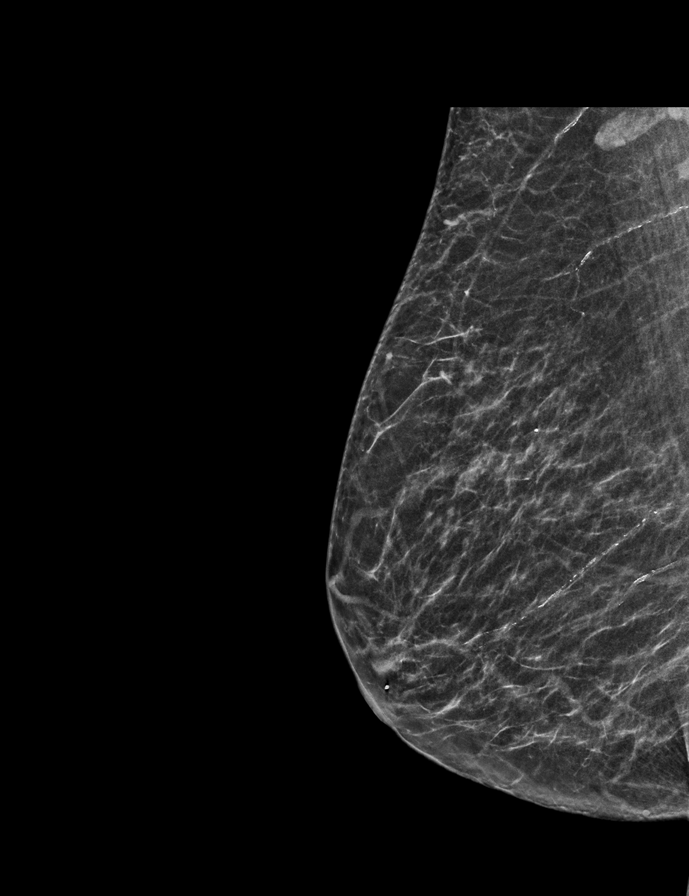

[L CC synth-2D]
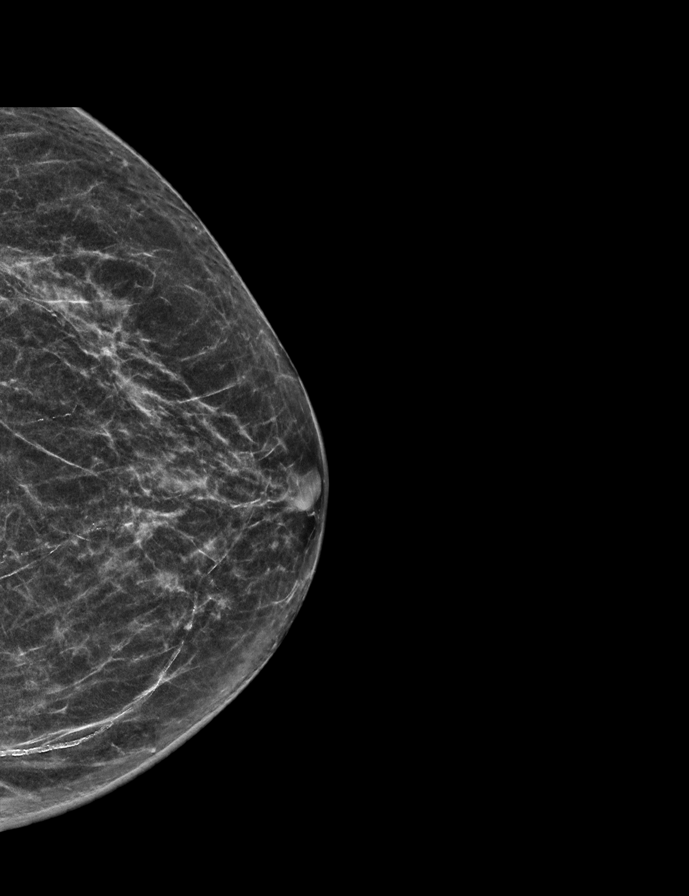

[L MLO synth-2D]
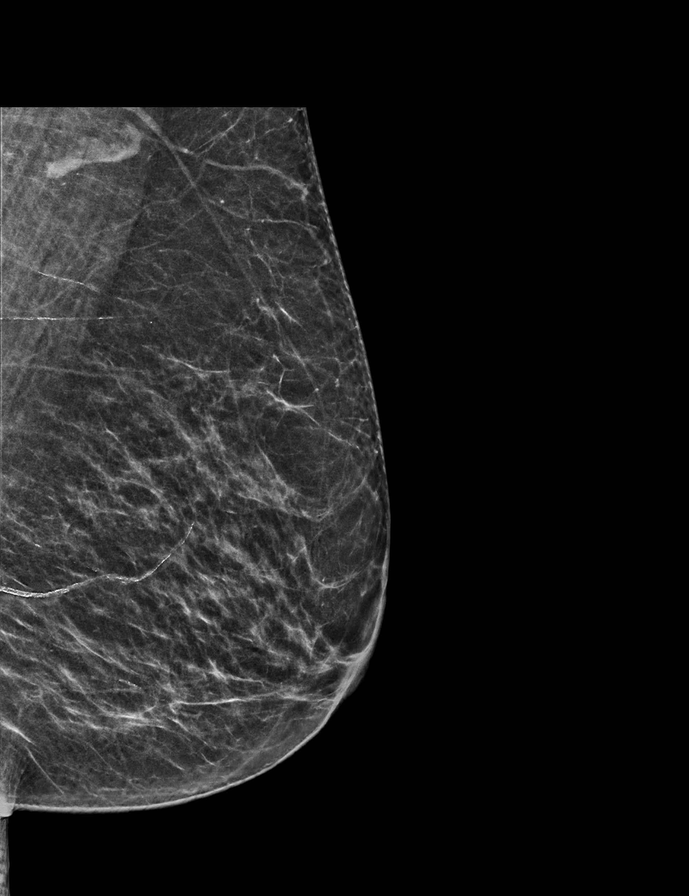

[L MLO tomo · 2 of 52 frames shown]
[frame 17/52]
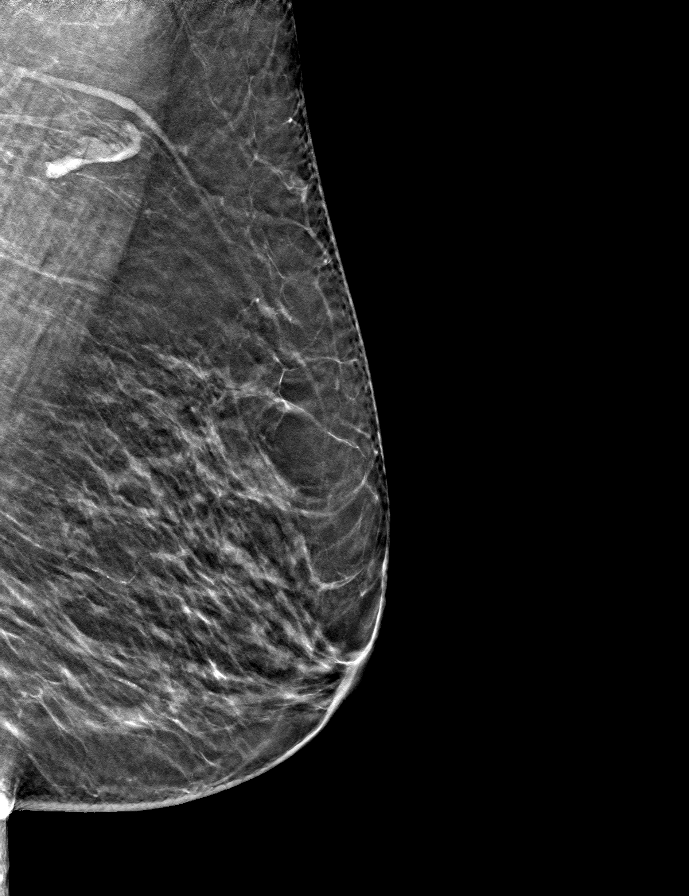
[frame 27/52]
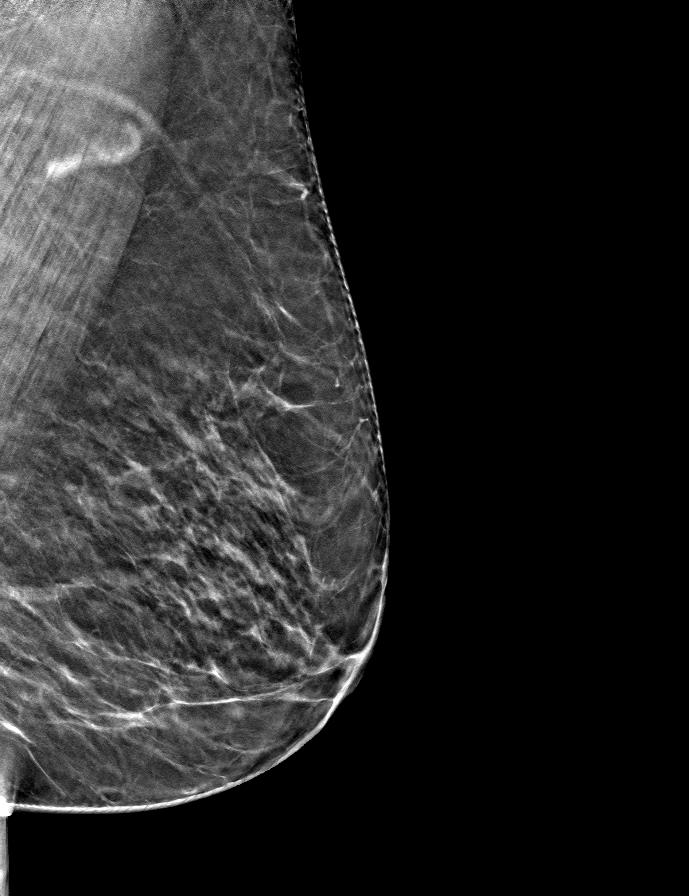

[R CC tomo · tomo slice 25/49.0]
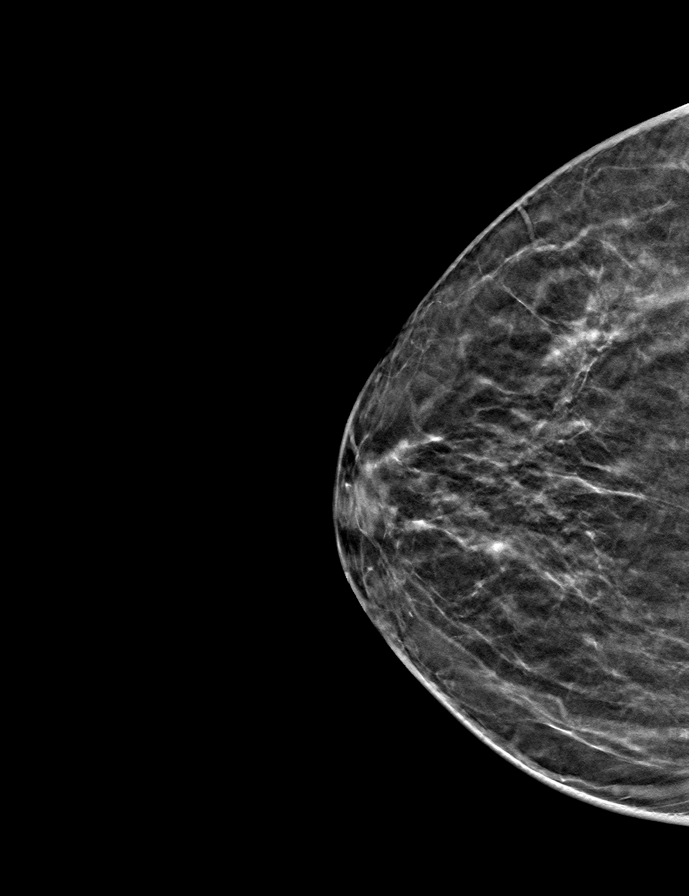

[L CC tomo · tomo slice 27/54.0]
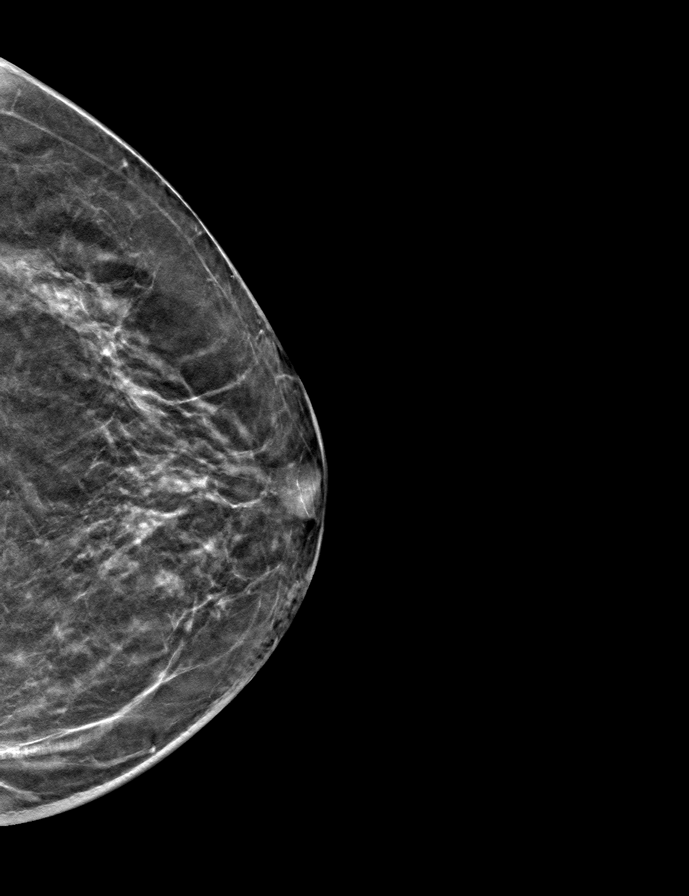

[R MLO tomo · tomo slice 26/51.0]
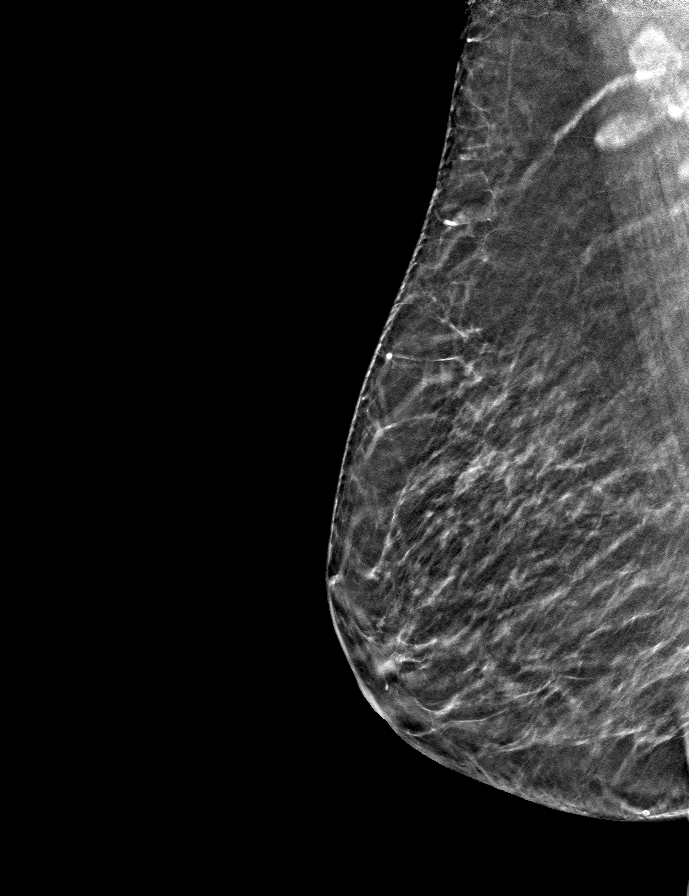

[9 of 24 positions shown; findings below may reference images not displayed]

ACR Breast Density Category b: There are scattered areas of
fibroglandular density.
FINDINGS: There are no findings suspicious for malignancy. Images were
processed with CAD.
IMPRESSION: No mammographic evidence of malignancy. A result letter of this
screening mammogram will be mailed directly to the patient.

RECOMMENDATION:
Screening mammogram in one year. (Code:[id number])

BI-RADS CATEGORY  1: Negative.

## 2020-10-12 ENCOUNTER — Telehealth: Payer: Self-pay

## 2020-10-12 NOTE — Telephone Encounter (Signed)
ekg on file

## 2020-10-29 NOTE — Progress Notes (Deleted)
Office Visit Note  Patient: Ashley Neal             Date of Birth: 09/19/1945           MRN: [id number]             PCP: Aretta Nip, MD Referring: Leighton Ruff, MD Visit Date: 11/12/2020 Occupation: @GUAROCC @  Subjective:  No chief complaint on file.   History of Present Illness: Ashley Neal is a 75 y.o. female ***   Activities of Daily Living:  Patient reports morning stiffness for *** {minute/hour:19697}.   Patient {ACTIONS;DENIES/REPORTS:21021675::"Denies"} nocturnal pain.  Difficulty dressing/grooming: {ACTIONS;DENIES/REPORTS:21021675::"Denies"} Difficulty climbing stairs: {ACTIONS;DENIES/REPORTS:21021675::"Denies"} Difficulty getting out of chair: {ACTIONS;DENIES/REPORTS:21021675::"Denies"} Difficulty using hands for taps, buttons, cutlery, and/or writing: {ACTIONS;DENIES/REPORTS:21021675::"Denies"}  No Rheumatology ROS completed.   PMFS History:  Patient Active Problem List   Diagnosis Date Noted  . Primary osteoarthritis of both hands 05/14/2020  . Primary osteoarthritis of both feet 05/14/2020  . Age-related osteoporosis without current pathological fracture 05/14/2020  . Prediabetes 05/14/2020  . Essential hypertension 05/14/2020  . History of hyperlipidemia 05/14/2020  . History of squamous cell carcinoma 05/14/2020  . Parkinsonism ([place]) 05/14/2020    Past Medical History:  Diagnosis Date  . Frequent headaches   . GERD (gastroesophageal reflux disease)   . High cholesterol   . Hyperlipidemia   . Hypertension   . Osteoarthritis   . Osteoporosis   . Ulcerative colitis ([place])     Family History  Problem Relation Age of Onset  . Endometrial cancer Mother   . Diabetes Mother   . Alzheimer's disease Father   . Heart disease Brother   . Other Brother        MVA   . Healthy Son   . Parkinson's disease Maternal Aunt   . COPD Sister   . Diabetes Sister   . Healthy Son   . Alcohol abuse Brother   . Heart disease Brother    Past  Surgical History:  Procedure Laterality Date  . CARPAL TUNNEL RELEASE Bilateral 1996/1997  . TUBAL LIGATION  1980's   Social History   Social History Narrative  . Not on file   Immunization History  Administered Date(s) Administered  . Janssen (J&J) SARS-COV-2 Vaccination 10/27/2019  . Tdap 07/11/2018  . Zoster Recombinat (Shingrix) 08/05/2018, 11/12/2018     Objective: Vital Signs: There were no vitals taken for this visit.   Physical Exam   Musculoskeletal Exam: ***  CDAI Exam: CDAI Score: -- Patient Global: --; Provider Global: -- Swollen: --; Tender: -- Joint Exam 11/12/2020   No joint exam has been documented for this visit   There is currently no information documented on the homunculus. Go to the Rheumatology activity and complete the homunculus joint exam.  Investigation: No additional findings.  Imaging: No results found.  Recent Labs: Lab Results  Component Value Date   WBC 5.3 09/18/2014   HGB 14.1 09/18/2014   PLT 227 09/18/2014   NA 142 09/18/2014   K 3.7 09/18/2014   CL 106 09/18/2014   CO2 28 09/18/2014   GLUCOSE 98 09/18/2014   BUN 9 09/18/2014   CREATININE 0.87 09/18/2014   CALCIUM 9.9 09/18/2014   GFRAA 78 (L) 09/18/2014    Speciality Comments: No specialty comments available.  Procedures:  No procedures performed Allergies: Codeine and Tape   Assessment / Plan:     Visit Diagnoses: Primary osteoarthritis of both hands  Primary osteoarthritis of both feet  Age-related osteoporosis without  current pathological fracture  Prediabetes  Essential hypertension  History of hyperlipidemia  History of squamous cell carcinoma  Parkinsonism, unspecified Parkinsonism type ([place])  Family history of Crohn's disease  Family history of psoriasis in father  Orders: No orders of the defined types were placed in this encounter.  No orders of the defined types were placed in this encounter.   Face-to-face time spent with patient  was *** minutes. Greater than 50% of time was spent in counseling and coordination of care.  Follow-Up Instructions: No follow-ups on file.   Ofilia Neas, PA-C  Note - This record has been created using Dragon software.  Chart creation errors have been sought, but may not always  have been located. Such creation errors do not reflect on  the standard of medical care.,

## 2020-11-12 ENCOUNTER — Ambulatory Visit: Payer: Medicare Other | Admitting: Physician Assistant

## 2020-11-12 DIAGNOSIS — M810 Age-related osteoporosis without current pathological fracture: Secondary | ICD-10-CM

## 2020-11-12 DIAGNOSIS — Z8379 Family history of other diseases of the digestive system: Secondary | ICD-10-CM

## 2020-11-12 DIAGNOSIS — G20 Parkinson's disease: Secondary | ICD-10-CM

## 2020-11-12 DIAGNOSIS — M19071 Primary osteoarthritis, right ankle and foot: Secondary | ICD-10-CM

## 2020-11-12 DIAGNOSIS — R7303 Prediabetes: Secondary | ICD-10-CM

## 2020-11-12 DIAGNOSIS — Z840 Family history of diseases of the skin and subcutaneous tissue: Secondary | ICD-10-CM

## 2020-11-12 DIAGNOSIS — I10 Essential (primary) hypertension: Secondary | ICD-10-CM

## 2020-11-12 DIAGNOSIS — Z8589 Personal history of malignant neoplasm of other organs and systems: Secondary | ICD-10-CM

## 2020-11-12 DIAGNOSIS — M19041 Primary osteoarthritis, right hand: Secondary | ICD-10-CM

## 2020-11-12 DIAGNOSIS — Z8639 Personal history of other endocrine, nutritional and metabolic disease: Secondary | ICD-10-CM

## 2020-11-13 DIAGNOSIS — F4323 Adjustment disorder with mixed anxiety and depressed mood: Secondary | ICD-10-CM | POA: Diagnosis not present

## 2020-11-20 ENCOUNTER — Ambulatory Visit: Payer: PPO | Admitting: Interventional Cardiology

## 2020-12-11 DIAGNOSIS — F4323 Adjustment disorder with mixed anxiety and depressed mood: Secondary | ICD-10-CM | POA: Diagnosis not present

## 2021-01-01 DIAGNOSIS — F4323 Adjustment disorder with mixed anxiety and depressed mood: Secondary | ICD-10-CM | POA: Diagnosis not present

## 2021-01-18 ENCOUNTER — Other Ambulatory Visit: Payer: Self-pay

## 2021-01-18 ENCOUNTER — Encounter: Payer: Self-pay | Admitting: Interventional Cardiology

## 2021-01-18 ENCOUNTER — Ambulatory Visit: Payer: PPO | Admitting: Interventional Cardiology

## 2021-01-18 VITALS — Ht 61.0 in | Wt 144.4 lb

## 2021-01-18 DIAGNOSIS — G20 Parkinson's disease: Secondary | ICD-10-CM

## 2021-01-18 DIAGNOSIS — I951 Orthostatic hypotension: Secondary | ICD-10-CM | POA: Diagnosis not present

## 2021-01-18 DIAGNOSIS — I10 Essential (primary) hypertension: Secondary | ICD-10-CM | POA: Diagnosis not present

## 2021-01-18 NOTE — Progress Notes (Signed)
Cardiology Office Note   Date:  01/18/2021   ID:  Ashley SWEETING, DOB [birth year], MRN [id number]  PCP:  Aretta Nip, MD    No chief complaint on file.  Orthostatic hypotension  Wt Readings from Last 3 Encounters:  01/18/21 144 lb 6.4 oz (65.5 kg)  10/08/20 138 lb (62.6 kg)  05/14/20 143 lb 6.4 oz (65 kg)       History of Present Illness: BRANNON DECAIRE is a 75 y.o. female who is being seen today for the evaluation of orthostatic hypotension at the request of Rankins, Bill Salinas, MD.    Three to four months ago, she had wider fluctuations.  SHe would have to sit down more often.  Her diuretic was stopped and sx improved.   She will drink a few sips Coke when her BP drops.   She continues to take an exercise class. She does yoga, meditation, cardio-drum class.  She has more tremor and stiffness.    Denies : Chest pain. Leg edema. Nitroglycerin use. Orthopnea. Palpitations. Paroxysmal nocturnal dyspnea. Shortness of breath. Syncope.      Past Medical History:  Diagnosis Date   Frequent headaches    GERD (gastroesophageal reflux disease)    High cholesterol    Hyperlipidemia    Hypertension    Osteoarthritis    Osteoporosis    Ulcerative colitis ([place])     Past Surgical History:  Procedure Laterality Date   CARPAL TUNNEL RELEASE Bilateral 1996/1997   TUBAL LIGATION  1980's     Current Outpatient Medications  Medication Sig Dispense Refill   ALPRAZolam (XANAX) 0.25 MG tablet Take 0.125-0.25 mg by mouth as needed (flight anxiety).     aspirin EC 81 MG tablet Take 81 mg by mouth daily.     calcium carbonate (OSCAL) 1500 (600 Ca) MG TABS tablet Take 1 tablet by mouth 2 (two) times a week.     carbidopa-levodopa (SINEMET IR) 25-100 MG tablet Take 1 tablet by mouth 4 (four) times daily. 360 tablet 3   lisinopril (ZESTRIL) 10 MG tablet Take 10 mg by mouth daily.     MAGNESIUM PO Take by mouth daily.     Multiple Vitamin (MULTIVITAMIN) tablet Take 1  tablet by mouth daily.     pramipexole (MIRAPEX) 0.125 MG tablet Take 1 tablet (0.125 mg total) by mouth at bedtime. (Patient taking differently: Take 0.125 mg by mouth as needed.) 30 tablet 5   No current facility-administered medications for this visit.    Allergies:   Codeine and Tape    Social History:  The patient  reports that she has never smoked. She has never used smokeless tobacco. She reports current alcohol use. She reports that she does not use drugs.   Family History:  The patient's family history includes Alcohol abuse in her brother; Alzheimer's disease in her father; Breast cancer in her maternal aunt; COPD in her sister; Colon cancer in her paternal aunt; Coronary artery disease in her father; Diabetes in her mother and sister; Endometrial cancer in her mother; Healthy in her son and son; Heart disease in her brother and brother; Lung cancer in her paternal uncle; Other in her brother; Parkinson's disease in her maternal aunt.    ROS:  Please see the history of present illness.   Otherwise, review of systems are positive for tremor, and other Parkinson's sx.   All other systems are reviewed and negative.    PHYSICAL EXAM:  VS:  Ht 5' 1"  (  1.549 m)   Wt 144 lb 6.4 oz (65.5 kg)   SpO2 98%   BMI 27.28 kg/m  , BMI Body mass index is 27.28 kg/m. GEN: Well nourished, well developed, in no acute distress HEENT: normal Neck: no JVD, carotid bruits, or masses Cardiac: RRR; no murmurs, rubs, or gallops,no edema  Respiratory:  clear to auscultation bilaterally, normal work of breathing GI: soft, nontender, nondistended, + BS MS: no deformity or atrophy Skin: warm and dry, no rash Neuro:  Strength and sensation are intact Psych: euthymic mood, full affect  Blood pressure 146/79, heart rate 71 lying down Blood pressure 138/81, heart rate 72 sitting Blood pressure 112/72, heart rate 78 standing, brief dizziness noted  EKG:   The ekg ordered today demonstrates NSR,  nonspecific ST changes, no change from prior   Recent Labs: No results found for requested labs within last 8760 hours.   Lipid Panel No results found for: CHOL, TRIG, HDL, CHOLHDL, VLDL, LDLCALC, LDLDIRECT   Other studies Reviewed: Additional studies/ records that were reviewed today with results demonstrating: LDL 133 in 6/21.   ASSESSMENT AND PLAN:  Orthostatic hypotension/hypertension: Likely related to Parkinson's disease.  Shy-Drager syndrome.  Blood pressures when she is lying down are not extremely.  Continue lisinopril at current dose.  On the rare occasion when she has a spike in her blood pressure above 814 systolic, give her the flexibility to take an extra 5 mg of lisinopril.  Stay well-hydrated.  Dehydration making orthostasis worse. Continue regular exercise in the setting of her Parkinson's. Hyperlipidemia: LDL was 133.  Healthy diet and regular exercise at this time.   Current medicines are reviewed at length with the patient today.  The patient concerns regarding her medicines were addressed.  The following changes have been made: As above  Labs/ tests ordered today include No orders of the defined types were placed in this encounter.   Recommend 150 minutes/week of aerobic exercise Low fat, low carb, high fiber diet recommended  Disposition:   FU in 6 months   Signed, Larae Grooms, MD  01/18/2021 11:11 AM    [address], [place], [place]  [address] Phone: [phone]; Fax: [phone]

## 2021-01-18 NOTE — Patient Instructions (Signed)
Medication Instructions:  Your physician recommends that you continue on your current medications as directed. Please refer to the Current Medication list given to you today.  *If you need a refill on your cardiac medications before your next appointment, please call your pharmacy*   Lab Work: none If you have labs (blood work) drawn today and your tests are completely normal, you will receive your results only by: [place] (if you have MyChart) OR A paper copy in the mail If you have any lab test that is abnormal or we need to change your treatment, we will call you to review the results.   Testing/Procedures: none   Follow-Up: At [facility], you and your health needs are our priority.  As part of our continuing mission to provide you with exceptional heart care, we have created designated Provider Care Teams.  These Care Teams include your primary Cardiologist (physician) and Advanced Practice Providers (APPs -  Physician Assistants and Nurse Practitioners) who all work together to provide you with the care you need, when you need it.  We recommend signing up for the patient portal called "MyChart".  Sign up information is provided on this After Visit Summary.  MyChart is used to connect with patients for Virtual Visits (Telemedicine).  Patients are able to view lab/test results, encounter notes, upcoming appointments, etc.  Non-urgent messages can be sent to your provider as well.   To learn more about what you can do with MyChart, go to [email or web address].    Your next appointment:   6 month(s)--December 12,2022 at 10:00  The format for your next appointment:   In Person  Provider:   Casandra Doffing, MD   Other Instructions May take extra 5 mg lisinopril if BP elevated.  Let us know if this happens often.   Make sure to stay well hydrated

## 2021-01-22 DIAGNOSIS — F4323 Adjustment disorder with mixed anxiety and depressed mood: Secondary | ICD-10-CM | POA: Diagnosis not present

## 2021-02-06 DIAGNOSIS — F4323 Adjustment disorder with mixed anxiety and depressed mood: Secondary | ICD-10-CM | POA: Diagnosis not present

## 2021-02-27 DIAGNOSIS — H43813 Vitreous degeneration, bilateral: Secondary | ICD-10-CM | POA: Diagnosis not present

## 2021-02-27 DIAGNOSIS — H2513 Age-related nuclear cataract, bilateral: Secondary | ICD-10-CM | POA: Diagnosis not present

## 2021-02-27 DIAGNOSIS — H02831 Dermatochalasis of right upper eyelid: Secondary | ICD-10-CM | POA: Diagnosis not present

## 2021-02-27 DIAGNOSIS — H04123 Dry eye syndrome of bilateral lacrimal glands: Secondary | ICD-10-CM | POA: Diagnosis not present

## 2021-02-27 DIAGNOSIS — H02834 Dermatochalasis of left upper eyelid: Secondary | ICD-10-CM | POA: Diagnosis not present

## 2021-03-01 DIAGNOSIS — F4323 Adjustment disorder with mixed anxiety and depressed mood: Secondary | ICD-10-CM | POA: Diagnosis not present

## 2021-03-08 DIAGNOSIS — F4323 Adjustment disorder with mixed anxiety and depressed mood: Secondary | ICD-10-CM | POA: Diagnosis not present

## 2021-03-13 DIAGNOSIS — I951 Orthostatic hypotension: Secondary | ICD-10-CM | POA: Diagnosis not present

## 2021-03-13 DIAGNOSIS — F418 Other specified anxiety disorders: Secondary | ICD-10-CM | POA: Diagnosis not present

## 2021-03-13 DIAGNOSIS — I10 Essential (primary) hypertension: Secondary | ICD-10-CM | POA: Diagnosis not present

## 2021-03-13 DIAGNOSIS — Z0000 Encounter for general adult medical examination without abnormal findings: Secondary | ICD-10-CM | POA: Diagnosis not present

## 2021-03-13 DIAGNOSIS — G20 Parkinson's disease: Secondary | ICD-10-CM | POA: Diagnosis not present

## 2021-03-13 DIAGNOSIS — R7303 Prediabetes: Secondary | ICD-10-CM | POA: Diagnosis not present

## 2021-03-15 DIAGNOSIS — F4323 Adjustment disorder with mixed anxiety and depressed mood: Secondary | ICD-10-CM | POA: Diagnosis not present

## 2021-03-26 DIAGNOSIS — F4323 Adjustment disorder with mixed anxiety and depressed mood: Secondary | ICD-10-CM | POA: Diagnosis not present

## 2021-04-08 ENCOUNTER — Ambulatory Visit: Payer: PPO | Admitting: Neurology

## 2021-04-08 ENCOUNTER — Other Ambulatory Visit: Payer: Self-pay

## 2021-04-08 ENCOUNTER — Encounter: Payer: Self-pay | Admitting: Neurology

## 2021-04-08 VITALS — Ht 61.0 in | Wt 145.6 lb

## 2021-04-08 DIAGNOSIS — L738 Other specified follicular disorders: Secondary | ICD-10-CM | POA: Diagnosis not present

## 2021-04-08 DIAGNOSIS — L659 Nonscarring hair loss, unspecified: Secondary | ICD-10-CM | POA: Diagnosis not present

## 2021-04-08 DIAGNOSIS — I951 Orthostatic hypotension: Secondary | ICD-10-CM

## 2021-04-08 DIAGNOSIS — L821 Other seborrheic keratosis: Secondary | ICD-10-CM | POA: Diagnosis not present

## 2021-04-08 DIAGNOSIS — L814 Other melanin hyperpigmentation: Secondary | ICD-10-CM | POA: Diagnosis not present

## 2021-04-08 DIAGNOSIS — I788 Other diseases of capillaries: Secondary | ICD-10-CM | POA: Diagnosis not present

## 2021-04-08 DIAGNOSIS — R42 Dizziness and giddiness: Secondary | ICD-10-CM | POA: Diagnosis not present

## 2021-04-08 DIAGNOSIS — G20 Parkinson's disease: Secondary | ICD-10-CM | POA: Diagnosis not present

## 2021-04-08 DIAGNOSIS — D692 Other nonthrombocytopenic purpura: Secondary | ICD-10-CM | POA: Diagnosis not present

## 2021-04-08 NOTE — Patient Instructions (Addendum)
It was nice to see you again today.  As discussed, your lightheadedness and symptoms of dizziness likely tie-in with your low blood pressure values when you sit and stand.  Please talk to your cardiologist about scaling back on your lisinopril.    You hydrate well with water, I would like for you to try over-the-counter type of compression socks up to the knees, when you are awake and out and about, please use these compression socks.  Please follow-up to see the nurse practitioner in this clinic in about 3 to 4 months.  Please continue to monitor your blood pressure values, your lying down blood pressure log shows borderline to mildly high findings, not severely high.  I think it is more important to address these big drops in blood pressure when you sit and especially when you stand.  Please stand up slowly and get your bearings first.  For now, please continue with your Sinemet, 1 pill 4 times a day, would not like to increase your medicine because it can cause blood pressure drops.  You can stay off the pramipexole, it was written for as needed use at night for restless leg symptoms.

## 2021-04-08 NOTE — Progress Notes (Signed)
Subjective:    Patient ID: Ashley Neal is a 75 y.o. female.  HPI    Interim history:   Ashley Neal is a 75 year old right-handed woman with an underlying medical history of hypertension, hyperlipidemia, history of colitis, osteoarthritis, osteoporosis, reflux disease, anxiety and obesity, who presents for follow-up consultation of her right sided parkinsonism. The patient is unaccompanied today. I last saw her on 10/08/2020, at which time Ashley Neal reported blood pressure fluctuations and dizziness associated with low BP values. Ashley Neal had irregularity on heart auscultation and was advised to FU with PCP and discuss cardiology referral. Ashley Neal was advised to keep her Sinemet at 1 pill qid.   Today, 04/08/21: Ashley Neal reports that Ashley Neal is struggling with dizziness and lightheadedness and feels like Ashley Neal has no energy to do even simple things.  Ashley Neal has had some medication changes.  Ashley Neal saw her cardiologist in June and I reviewed the note.  Ashley Neal has been off of hydrochlorothiazide but because of supine hypertensive numbers Ashley Neal has had a increase in her lisinopril.  Ashley Neal has been consistently taking lisinopril 15 mg daily but the cardiology note indicated that Ashley Neal could take an additional 5 mg if needed.  Ashley Neal has not had any falls.  Ashley Neal has not been as active.  Ashley Neal tries to hydrate well with water.  Occasionally Ashley Neal drinks caffeine to keep her blood pressure up but Ashley Neal has kept a log that shows home blood pressure values low when Ashley Neal is sitting and standing, higher when Ashley Neal is lying down but no severe hypertensive numbers when Ashley Neal shares her log on the phone with me.  Ashley Neal is planning a trip to [place] to be with her significant other for a couple of weeks.  Ashley Neal has had a high school reunion a few months ago and enjoyed it.  Ashley Neal has had some anxiety over her blood pressure fluctuation and dizzy spells.  Ashley Neal has also had joint pain.  Ashley Neal has seen a rheumatologist and was told that Ashley Neal does not have rheumatoid arthritis.  Ashley Neal  reports bilateral knee pain, hip pain and elbow pain.  Ashley Neal reports that Ashley Neal lost her older brother about 3 weeks ago.  He apparently had Parkinson's disease or parkinsonism and dementia.  Ashley Neal has had occasional difficulty swallowing, no actual choking but has had to cough with liquids and occasionally with solids.  When Ashley Neal went to [place], Ashley Neal went to the dispensary for marijuana and tried THC Gummies.  Ashley Neal tried a quarter of a gummy which helped reduce her tremor and Ashley Neal felt calm.  Ashley Neal felt better for up to 3 days and took a quarter of a gummy every 3 days until Ashley Neal came back.  The patient's allergies, current medications, family history, past medical history, past social history, past surgical history and problem list were reviewed and updated as appropriate.      Previously:   I saw her on 04/02/2020, at which time, Ashley Neal reported worsening tremors and posture was worse.  Ashley Neal had stopped taking the Mirapex because Ashley Neal did not have much in the way of restless leg symptoms anymore.  Ashley Neal was planning a trip to [place] in October 2021.  Ashley Neal was referred to physical therapy and we increased her Sinemet to 1 pill 4 times a day.  Ashley Neal emailed back a week or so later reporting that the Sinemet was helpful at the increased dose.             I saw her on 10/04/2019, at which time  Ashley Neal reported Some postural instability.  Ashley Neal felt that Ashley Neal had more tremor.  Ashley Neal had worsening urinary incontinence but constipation was better.  Ashley Neal was encouraged to see urology.  Ashley Neal endorsed some restlessness particularly affecting her feet at night.  Ashley Neal was advised to start low-dose Mirapex particularly for restless leg symptoms.     I saw her on 03/31/2019, at which time, Ashley Neal reported overall doing well, sometimes Ashley Neal had some more gait instability. Ashley Neal was active, but not able to pursue any of her group exercises including cardio drumming.  Ashley Neal had issues with constipation. Ashley Neal had bilateral foot pain, and was supposed  to see a podiatrist soon. Ashley Neal was considering CBD oil for arthritis and pain.       I saw her on 09/29/2018, at which time Ashley Neal reported that Ashley Neal tried tai chi but did not enjoy it.  Ashley Neal looked into boxing classes but decided not to pursue it, Ashley Neal did start cardio drumming which Ashley Neal was enjoying as well as chair yoga.  Ashley Neal had occasional symptoms of restless legs.  Ashley Neal felt stable motor wise, Ashley Neal had an occasional flareup in the right hand tremor, rare constipation issues.  Ashley Neal was advised to continue with Sinemet 1 pill 3 times daily.    I saw her on 03/29/2018 at which time Ashley Neal reported doing okay. Ashley Neal was taking Sinemet 3 times a day but sometimes would forget the midday dose. Is trying to exercise on a regular basis, would go to the gym about 3 days of the week. 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 I suggested Ashley Neal continue with Sinemet 3 times a day.   I saw her on 11/11/2017, at which time Ashley Neal reported diffuse aches and pains. Ashley Neal had seen orthopedics. Ashley Neal had blood work with PCP. Ashley Neal had seen Dr. Carles Collet twice. Ashley Neal was trying to exercise. Ashley Neal was advised to follow-up routinely and advised that we would continue to observe her and follow clinically.   Ashley Neal called in the interim in late April after Ashley Neal had seen rheumatology. Ashley Neal was advised to start Sinemet with gradual titration at the time.   [name] on 04/16/2017 at the request of her primary care physician, at which time Ashley Neal reported a several month history of intermittent right hand tremors and also additional symptoms including difficulty turning over in bed or certain movements when dancing. I suggested observation and a 6 month follow-up. I did suggest we could proceed with a brain MRI but Ashley Neal declined as Ashley Neal was highly claustrophobic and simple anti-anxiety medication would not help. Ashley Neal had interim appointments with Dr. Carles Collet on 06/09/17 and again on 10/20/17. I reviewed the notes. Potential symptomatic medication was  discussed, a PET scan was also discussed.      04/16/2017: (Ashley Neal) reports a several month history of a intermittent right hand tremor. Ashley Neal also reports difficulty with certain movements such as turning over in bed or certain movements when dancing. I reviewed your office note from 04/09/2017, which you kindly included. Ashley Neal reports recent weight loss without particularly trying to lose weight. Blood work through your office from 04/02/2017 was reviewed revealing normal CMP with a glucose level of 105, total cholesterol of 107, triglycerides 171, LDL 126. Recent A1c on 04/09/2017 was borderline at 5.8. CBC with differential was normal, B12 434. N TSH and N CK level on 04/09/17.  No FHx of PD or ET, father had dementia and lived to be 30, mom died at 37 from  metastatic endom cancer. One brother with asthma, 2 sister, younger brother with EtOH abuse, youngest brother died at 18 yo in a car accident.  Ashley Neal has R shoulder problems, has had back pain in the mid back. Has had dexterity issue. Ashley Neal saw ortho at [facility], and has been to [place] for PT.  Ashley Neal is doing exercises. Ashley Neal works for a non-profit. Ashley Neal loves baking.  Has retired recently, 2 grown sons, one of them local, the other in [place].  Ashley Neal is a non-smoker.  Her Past Medical History Is Significant For: Past Medical History:  Diagnosis Date   Frequent headaches    GERD (gastroesophageal reflux disease)    High cholesterol    Hyperlipidemia    Hypertension    Osteoarthritis    Osteoporosis    Parkinson disease ([place])    Ulcerative colitis ([place])     Her Past Surgical History Is Significant For: Past Surgical History:  Procedure Laterality Date   CARPAL TUNNEL RELEASE Bilateral 1996/1997   TUBAL LIGATION  1980's    Her Family History Is Significant For: Family History  Problem Relation Age of Onset   Endometrial cancer Mother    Diabetes Mother    Alzheimer's disease Father    Coronary artery disease Father    Heart disease  Brother    Other Brother        MVA    Healthy Son    Parkinson's disease Maternal Aunt    Breast cancer Maternal Aunt    COPD Sister    Diabetes Sister    Healthy Son    Alcohol abuse Brother    Heart disease Brother    Colon cancer Paternal Aunt    Lung cancer Paternal Uncle     Her Social History Is Significant For: Social History   Socioeconomic History   Marital status: Divorced    Spouse name: Not on file   Number of children: Not on file   Years of education: Not on file   Highest education level: Not on file  Occupational History   Occupation: retired    Comment: Chiropractor  Tobacco Use   Smoking status: Never   Smokeless tobacco: Never  Vaping Use   Vaping Use: Never used  Substance and Sexual Activity   Alcohol use: Yes    Comment: rare   Drug use: No   Sexual activity: Not on file  Other Topics Concern   Not on file  Social History Narrative   Not on file   Social Determinants of Health   Financial Resource Strain: Not on file  Food Insecurity: Not on file  Transportation Needs: Not on file  Physical Activity: Not on file  Stress: Not on file  Social Connections: Not on file    Her Allergies Are:  Allergies  Allergen Reactions   Codeine Other (See Comments)    unknown   Tape Other (See Comments)    unknown  :   Her Current Medications Are:  Outpatient Encounter Medications as of 04/08/2021  Medication Sig   ALPRAZolam (XANAX) 0.25 MG tablet Take 0.125-0.25 mg by mouth as needed (flight anxiety).   aspirin EC 81 MG tablet Take 81 mg by mouth daily.   carbidopa-levodopa (SINEMET IR) 25-100 MG tablet Take 1 tablet by mouth 4 (four) times daily.   lisinopril (ZESTRIL) 10 MG tablet Take 15 mg by mouth daily.   MAGNESIUM PO Take by mouth. Per pt takes 2x a week   Multiple Vitamin (MULTIVITAMIN)  tablet Take 1 tablet by mouth daily.   pramipexole (MIRAPEX) 0.125 MG tablet Take 1 tablet (0.125 mg total) by mouth at bedtime. (Patient taking  differently: Take 0.125 mg by mouth as needed.)   calcium carbonate (OSCAL) 1500 (600 Ca) MG TABS tablet Take 1 tablet by mouth 2 (two) times a week.   No facility-administered encounter medications on file as of 04/08/2021.  :  Review of Systems:  Out of a complete 14 point review of systems, all are reviewed and negative with the exception of these symptoms as listed below:  Review of Systems  Neurological:        Pt states Ashley Neal does not feel well. Pt states that tremors are in right hand mostly . Pt states Ashley Neal is having a lot of joint pain and achey . Pt states that Ashley Neal is always cold . Pt complains her of dizziness when Ashley Neal gets out of bed .   Objective:  Neurological Exam  Physical Exam Physical Examination:   On orthostatic testing: Lying blood pressure 140/82 with a pulse of 76, sitting 114/79 with a pulse of 85, standing 95/68 with pulse of 87, feels lightheaded upon standing.  General Examination: The patient is a very pleasant 75 y.o. female in no acute distress. Ashley Neal appears well-developed and well-nourished and well groomed.   HEENT: Normocephalic, atraumatic, pupils are equal, round and reactive to light, corrective eyeglasses in place. Extraocular tracking is mildly impaired.  No nystagmus.  Mild to moderate facial masking noted, no dysarthria, no obvious sialorrhea.  Mild hypophonia.  Neck is mild to moderately rigid.  No carotid bruits.  Airway examination reveals stable findings, tongue protrudes centrally and palate elevates symmetrically.    Chest, heart, abdomen and extremities:    Chest is clear to auscultation, heart sounds normal, but Ashley Neal does have intermittent extra beats with subsequent pauses noted.    There are prominent arthritic changes in both hands. Puffy ankles but not in the realm of pitting edema, all stable.    Skin: Warm and Ashley Neal does have mildly cold fingers and toes.  Mild varicose veins in the distal lower extremities and also some spider veins.    Musculoskeletal: Good range of motion, difficulty with hand movements secondary to arthritis and trigger finger-like changes, stable.   Neurologically:  Mental status: The patient is awake, alert and oriented in all 4 spheres. Her immediate and remote memory, attention, language skills and fund of knowledge are appropriate. There is no evidence of aphasia, agnosia, apraxia or anomia. Speech is clear with normal prosody and enunciation. Thought process is linear. Mood is normal and affect is normal.  Cranial nerves II - XII are as described above under HEENT exam. Left shoulder is slightly higher than right, stable. Motor exam: Normal bulk, and strength is noted for age. Ashley Neal has a mild increase in tone in the right upper extremity, stable.  Ashley Neal has a mild  resting tremor in the right upper extremity.  Overall, mild slowness in movements.   (On 04/16/2017: On Archimedes spiral drawing there is no significant trembling noted. Her handwriting is not tremulous and legible, slightly micrographic.)     Fine motor skills and coordination: Ashley Neal has mild to moderate difficulty with hand movements and finger taps, fine motor skills over all mild to moderately impaired on the right upper and lower extremity and better on the left.  Cerebellar testing: No dysmetria or intention tremor on finger to nose testing. There is no truncal or gait ataxia.  Sensory exam: intact to light touch in the upper and lower extremities.  Gait, station and balance: Ashley Neal stands easily. No veering to one side is noted. No leaning to one side is noted. Posture is mildly stooped, Ashley Neal walks with decreased stride length and pace, mild shuffling today.  Ashley Neal has decreased arm swing bilaterally, right more noticeable than left.  Ashley Neal turns slowly.  Ashley Neal is slightly more insecure in her walking today compared to last visit.  Assessment and Plan:    In summary, RANDI POULLARD is a very pleasant 75 year old female with an underlying medical  history of hypertension, hyperlipidemia, history of colitis, osteoarthritis, osteoporosis, reflux disease, anxiety and obesity, who presents for follow-up consultation of her right-sided parkinsonism, most likely right-sided predominant Parkinson's disease. Ashley Neal has benefited from generic Sinemet, we increased this from 1 pill 3 times daily to 1 pill 4 times a day in August 2021.  Unfortunately, Ashley Neal has been struggling with dizziness and has had orthostatic hypotension.  Ashley Neal has had some medication changes, Ashley Neal is advised to continue the conversation about blood pressure management with her cardiologist as well.  I would be reluctant to change her Sinemet, especially increase it at this time for fear of exacerbation of her orthostatic lightheadedness and orthostatic hypotension.  Ashley Neal is advised to change positions slowly and stay well-hydrated with water, Ashley Neal is advised to start using compression socks when Ashley Neal is awake and out and about.  Ashley Neal can buy the over-the-counter kind that are not medical grade.  Ashley Neal is furthermore advised to ask her cardiologist to see if Ashley Neal could reduce her lisinopril at this time.   Of note, Ashley Neal had a second opinion with Dr. Carles Collet in the past.  Ashley Neal has had more changes in her posture and walking and also tremor over time, but benefited from the increase in Sinemet.  Ashley Neal has established with a new primary care physician but has not discussed her joint pain with her.  Ashley Neal is encouraged to bring this up for further evaluation but reports that Ashley Neal has seen a rheumatologist in the recent past and was told that Ashley Neal does not have rheumatoid arthritis.  In the past, Ashley Neal benefited from physical therapy.  Ashley Neal has had some swallowing difficulty and we will monitor closely.  We will consider a swallow study in the near future.  Of note, Ashley Neal no longer takes pramipexole.  For now, Ashley Neal is advised to continue with generic Sinemet 1 pill 4 times a day and follow-up to see one of our nurse practitioners in  about 3 to 4 months, sooner if needed.  I answered all her questions today and Ashley Neal was in agreement.  I spent 30 minutes in total face-to-face time and in reviewing records during pre-charting, more than 50% of which was spent in counseling and coordination of care, reviewing test results, reviewing medications and treatment regimen and/or in discussing or reviewing the diagnosis of PD, the prognosis and treatment options. Pertinent laboratory and imaging test results that were available during this visit with the patient were reviewed by me and considered in my medical decision making (see chart for details).

## 2021-04-09 ENCOUNTER — Telehealth: Payer: Self-pay | Admitting: Interventional Cardiology

## 2021-04-09 DIAGNOSIS — F4323 Adjustment disorder with mixed anxiety and depressed mood: Secondary | ICD-10-CM | POA: Diagnosis not present

## 2021-04-09 DIAGNOSIS — I10 Essential (primary) hypertension: Secondary | ICD-10-CM | POA: Diagnosis not present

## 2021-04-09 DIAGNOSIS — Z6828 Body mass index (BMI) 28.0-28.9, adult: Secondary | ICD-10-CM | POA: Diagnosis not present

## 2021-04-09 DIAGNOSIS — B370 Candidal stomatitis: Secondary | ICD-10-CM | POA: Diagnosis not present

## 2021-04-09 DIAGNOSIS — G20 Parkinson's disease: Secondary | ICD-10-CM | POA: Diagnosis not present

## 2021-04-09 DIAGNOSIS — I951 Orthostatic hypotension: Secondary | ICD-10-CM | POA: Diagnosis not present

## 2021-04-09 MED ORDER — LISINOPRIL 10 MG PO TABS: 10.0000 mg | ORAL_TABLET | Freq: Every day | ORAL | 3 refills | Status: DC

## 2021-04-09 NOTE — Telephone Encounter (Signed)
I spoke with patient.  She saw neurology yesterday (note in Epic).  BP was low. Patient reports she keeps record of BP at home. She reports recent readings of 98/62, 92/56, 110/82, 107/69. Patient states lower readings are standing and higher reading is lying down.  She feels "terrible." States taking sips of Coke helps a little and she then feels slightly better. Patient states she was taking lisinopril 10 mg daily when she saw Dr Irish Lack in June.  Instructions at that time were to continue same medications but patient could take extra 5 mg for spikes in BP.  Patient misunderstood and increased to lisinopril 15 mg daily at that time.  She checked her prescription bottle and ordering provider was Dr Zadie Rhine. I told patient she should decrease lisinopril back to 10 mg daily. She does not want to do this until checking with Dr Irish Lack.  Patient is concerned because BP was low when she was seen by Dr Irish Lack and no changes were made at that time.  She requests we check with Dr Irish Lack regarding lisinopril dosage

## 2021-04-09 NOTE — Telephone Encounter (Signed)
I spoke with patient and gave her information from Dr Irish Lack.  Prescription sent to [facility]

## 2021-04-09 NOTE — Telephone Encounter (Signed)
Pt c/o BP issue: STAT if pt c/o blurred vision, one-sided weakness or slurred speech  1. What are your last 5 BP readings?  04/08/21: 97/68  2. Are you having any other symptoms (ex. Dizziness, headache, blurred vision, passed out)?  Lack of energy   3. What is your BP issue?   Patient states her BP has been extremely low.

## 2021-04-09 NOTE — Telephone Encounter (Signed)
I agee with lisinopril 10 mg daily with an extra 5 mg PO x 1 daily if her BP spikes above 160 mm Hg.  JV

## 2021-04-23 DIAGNOSIS — F4323 Adjustment disorder with mixed anxiety and depressed mood: Secondary | ICD-10-CM | POA: Diagnosis not present

## 2021-04-25 ENCOUNTER — Other Ambulatory Visit: Payer: Self-pay | Admitting: Neurology

## 2021-05-06 DIAGNOSIS — J029 Acute pharyngitis, unspecified: Secondary | ICD-10-CM | POA: Diagnosis not present

## 2021-05-07 DIAGNOSIS — F4323 Adjustment disorder with mixed anxiety and depressed mood: Secondary | ICD-10-CM | POA: Diagnosis not present

## 2021-05-20 DIAGNOSIS — M7918 Myalgia, other site: Secondary | ICD-10-CM | POA: Diagnosis not present

## 2021-05-20 IMAGING — US US EXTREM LOW VENOUS*L*
1 series · 13 of 24 positions shown · non-contrast
Comparison: None.

CLINICAL DATA: 72-year-old female with a history of pain in the
left popliteal fossa



[Series 1: us extrem low venous*left* · 13 of 33 slices shown]
[im 1/33]
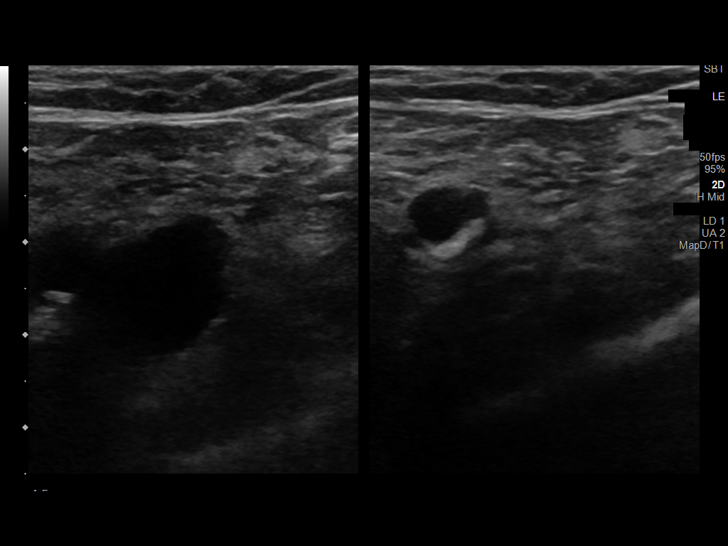
[im 3/33]
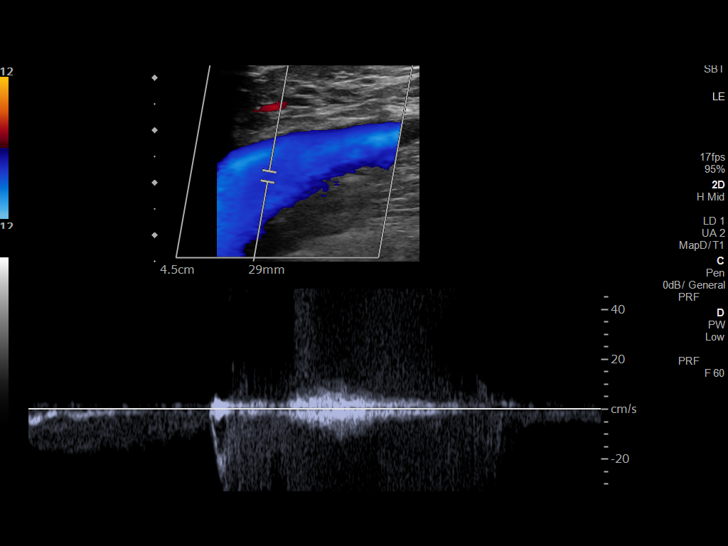
[im 6/33]
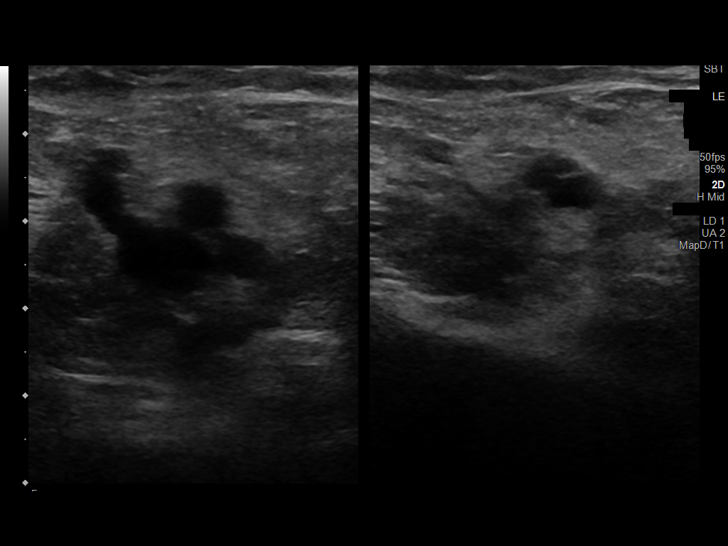
[im 9/33]
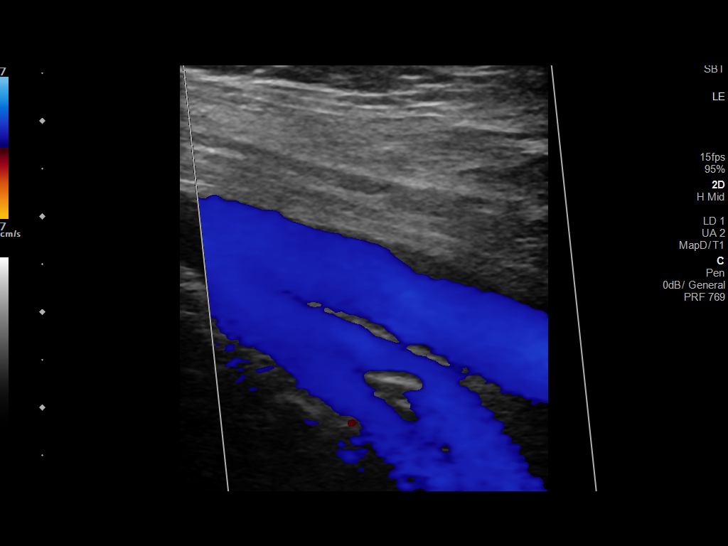
[im 12/33]
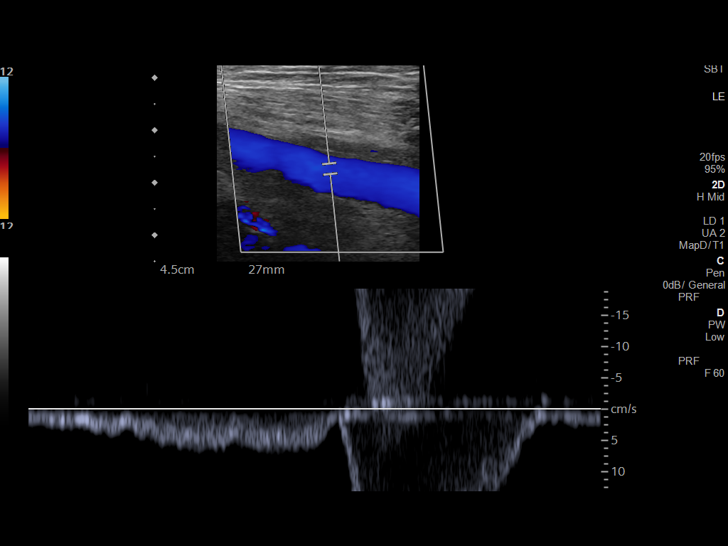
[im 14/33]
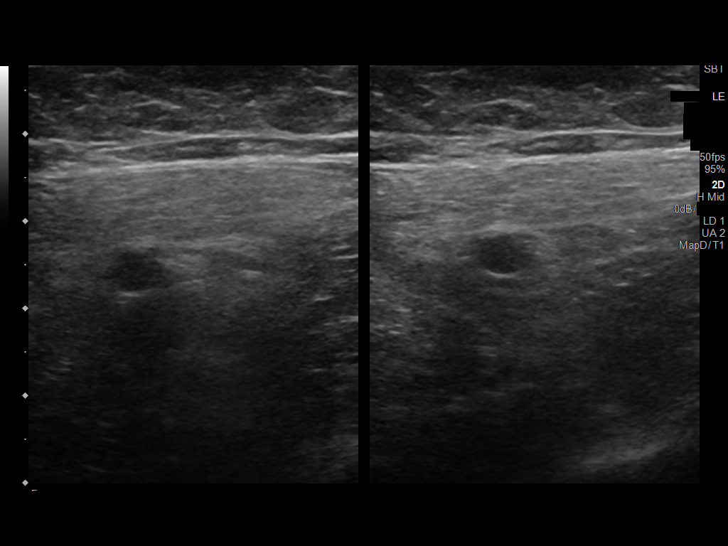
[im 17/33]
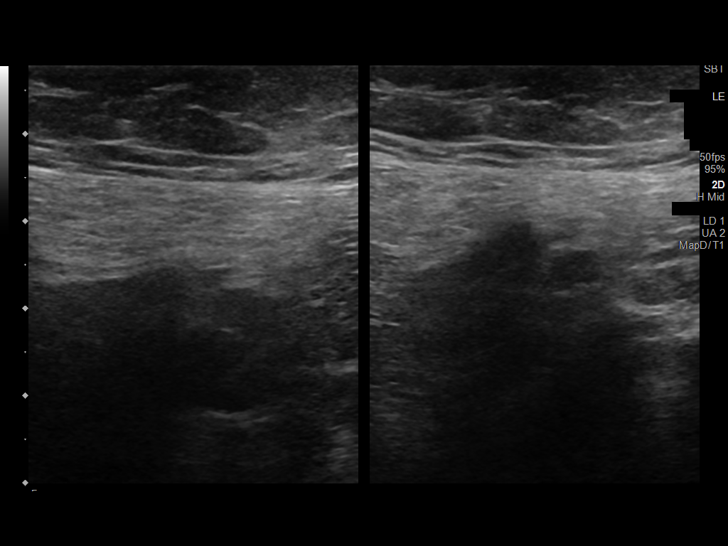
[im 19/33]
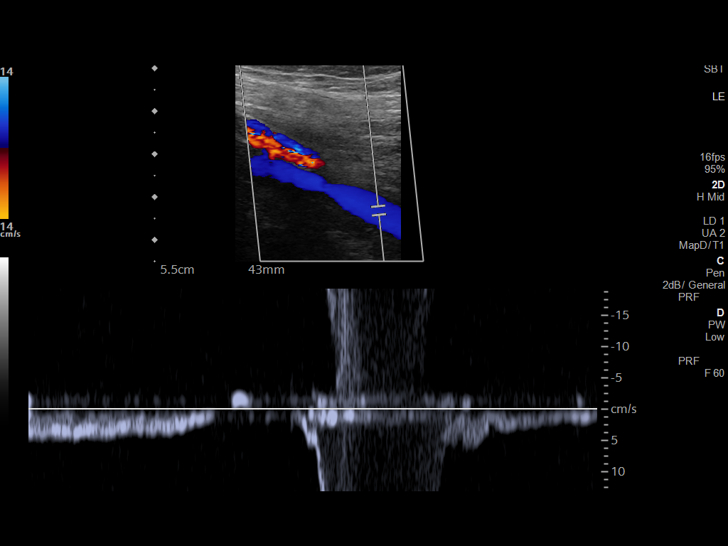
[im 21/33]
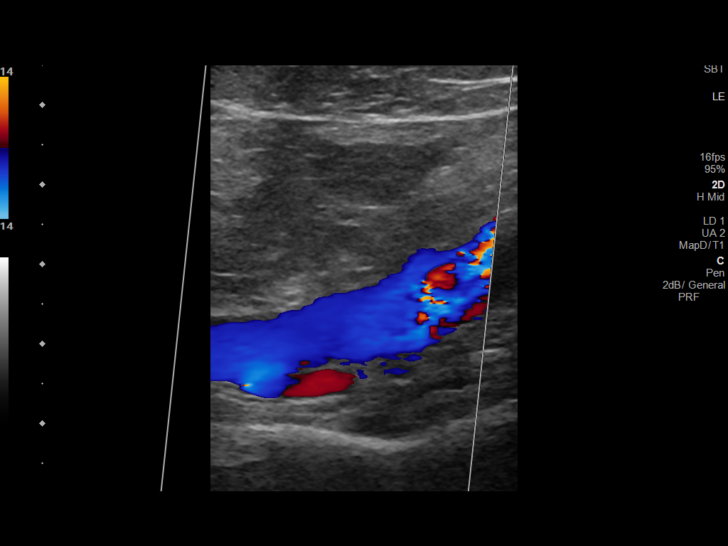
[im 24/33]
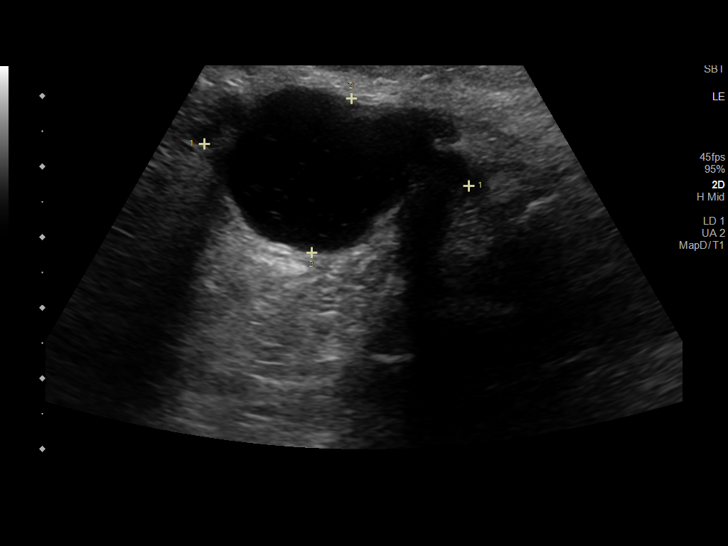
[im 27/33]
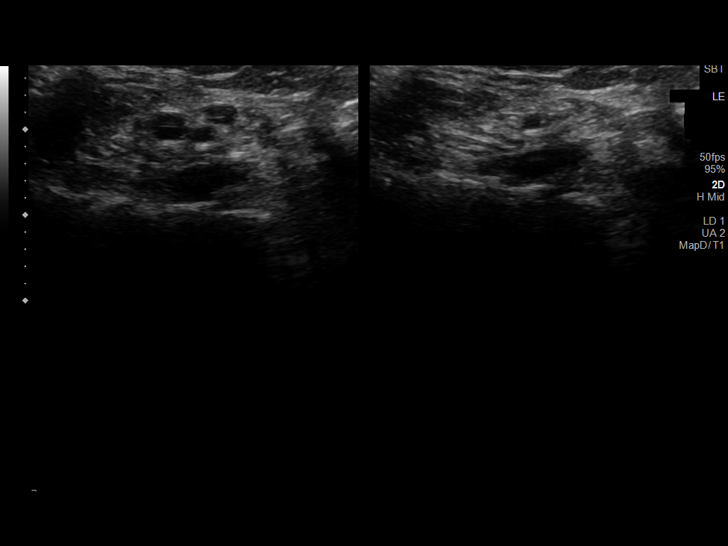
[im 30/33]
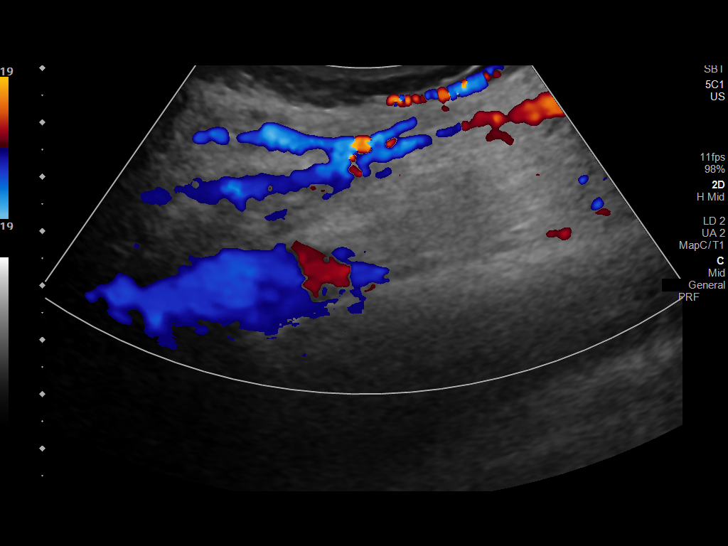
[im 33/33]
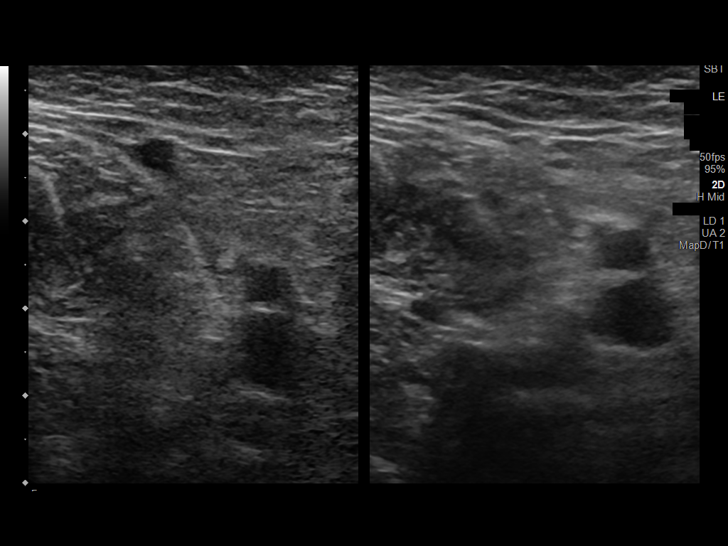

[13 of 24 positions shown; findings below may reference images not displayed]

FINDINGS: Contralateral Common Femoral Vein: Respiratory phasicity is normal
and symmetric with the symptomatic side. No evidence of thrombus.
Normal compressibility.

Common Femoral Vein: No evidence of thrombus. Normal
compressibility, respiratory phasicity and response to augmentation.

Saphenofemoral Junction: No evidence of thrombus. Normal
compressibility and flow on color Doppler imaging.

Profunda Femoral Vein: No evidence of thrombus. Normal
compressibility and flow on color Doppler imaging.

Femoral Vein: No evidence of thrombus. Normal compressibility,
respiratory phasicity and response to augmentation.

Popliteal Vein: No evidence of thrombus. Normal compressibility,
respiratory phasicity and response to augmentation.

Calf Veins: No evidence of thrombus. Normal compressibility and flow
on color Doppler imaging.

Superficial Great Saphenous Vein: No evidence of thrombus. Normal
compressibility and flow on color Doppler imaging.

Other Findings: Anechoic fluid collection in the left popliteal
fossa measuring 3.8 cm x 2.4 cm x 4.3 cm
IMPRESSION: Sonographic survey of the left lower extremity negative for DVT.

Left Baker's cyst.

## 2021-05-21 DIAGNOSIS — F4323 Adjustment disorder with mixed anxiety and depressed mood: Secondary | ICD-10-CM | POA: Diagnosis not present

## 2021-05-27 ENCOUNTER — Telehealth: Payer: Self-pay | Admitting: Neurology

## 2021-05-27 DIAGNOSIS — I951 Orthostatic hypotension: Secondary | ICD-10-CM | POA: Diagnosis not present

## 2021-05-27 DIAGNOSIS — Z6828 Body mass index (BMI) 28.0-28.9, adult: Secondary | ICD-10-CM | POA: Diagnosis not present

## 2021-05-27 DIAGNOSIS — G5701 Lesion of sciatic nerve, right lower limb: Secondary | ICD-10-CM | POA: Diagnosis not present

## 2021-05-27 DIAGNOSIS — I10 Essential (primary) hypertension: Secondary | ICD-10-CM | POA: Diagnosis not present

## 2021-05-27 DIAGNOSIS — G20 Parkinson's disease: Secondary | ICD-10-CM | POA: Diagnosis not present

## 2021-05-27 NOTE — Telephone Encounter (Signed)
Pt called stating that she is in a lot of pain and due to the pain her BP is high. Last BP was 190/98. Pt also states that she is having dificulty walking as well and is wanting to discuss with [place]. Please advise.

## 2021-05-27 NOTE — Telephone Encounter (Signed)
I called pt and she stated she hurt her back about 2 wks ago and has been seen by her NP /pcp at [facility] and placed on meloxicam and pain medication, had bad day yesterday.  Her bp has been elevated and was told to call us.  I relayed that definitely pain can increase bp, she would need to touch base with pcp for additional eval for her back, she had seen cardiology about her bp and she will discuss her bp with her as well.  Her PD sx trembling of her hands and shuffling walk is worse.  I relayed that can address those sx with Dr. Rexene Alberts. Recommend to see NP/pcp and see how she does, she will call back if things do not get better for her. She appreciated call back.

## 2021-05-28 NOTE — Telephone Encounter (Signed)
Spoke with the patient and discussed the message from Dr. Rexene Alberts.  She understands at this time there are no additional recommendations.  She can continue to monitor her blood pressure and continue with symptomatic management of her back pain with her primary care.  Patient aware symptoms of Parkinson's disease can certainly get worse temporarily under stress or pain.  Patient stated she saw her primary care yesterday and they gave her medication for pain and for her blood pressure.  I let her know she can give [place] a call back if needed.  She verbalized appreciation for the call.

## 2021-05-28 NOTE — Telephone Encounter (Signed)
No additional recommendations at this time, symptomatic management of her back pain through primary care for now, she can continue to monitor her blood pressure. Symptoms of Parkinson's disease can certainly get worse temporarily under stress or pain.

## 2021-06-03 DIAGNOSIS — M25552 Pain in left hip: Secondary | ICD-10-CM | POA: Diagnosis not present

## 2021-06-04 ENCOUNTER — Ambulatory Visit
Admission: RE | Admit: 2021-06-04 | Discharge: 2021-06-04 | Disposition: A | Discharge status: Home / Self Care | Payer: PPO | Source: Ambulatory Visit | Attending: Sports Medicine | Admitting: Sports Medicine

## 2021-06-04 ENCOUNTER — Other Ambulatory Visit: Payer: Self-pay | Admitting: Sports Medicine

## 2021-06-04 DIAGNOSIS — M25552 Pain in left hip: Secondary | ICD-10-CM

## 2021-06-04 DIAGNOSIS — M47816 Spondylosis without myelopathy or radiculopathy, lumbar region: Secondary | ICD-10-CM | POA: Diagnosis not present

## 2021-06-04 DIAGNOSIS — M5489 Other dorsalgia: Secondary | ICD-10-CM

## 2021-06-04 DIAGNOSIS — F4323 Adjustment disorder with mixed anxiety and depressed mood: Secondary | ICD-10-CM | POA: Diagnosis not present

## 2021-06-04 DIAGNOSIS — M545 Low back pain, unspecified: Secondary | ICD-10-CM | POA: Diagnosis not present

## 2021-06-10 DIAGNOSIS — I10 Essential (primary) hypertension: Secondary | ICD-10-CM | POA: Diagnosis not present

## 2021-06-10 DIAGNOSIS — G20 Parkinson's disease: Secondary | ICD-10-CM | POA: Diagnosis not present

## 2021-06-10 DIAGNOSIS — M25552 Pain in left hip: Secondary | ICD-10-CM | POA: Diagnosis not present

## 2021-06-10 DIAGNOSIS — M545 Low back pain, unspecified: Secondary | ICD-10-CM | POA: Diagnosis not present

## 2021-06-17 DIAGNOSIS — M545 Low back pain, unspecified: Secondary | ICD-10-CM | POA: Diagnosis not present

## 2021-06-17 DIAGNOSIS — M6281 Muscle weakness (generalized): Secondary | ICD-10-CM | POA: Diagnosis not present

## 2021-06-17 DIAGNOSIS — M2569 Stiffness of other specified joint, not elsewhere classified: Secondary | ICD-10-CM | POA: Diagnosis not present

## 2021-06-17 DIAGNOSIS — R5381 Other malaise: Secondary | ICD-10-CM | POA: Diagnosis not present

## 2021-06-18 DIAGNOSIS — F4323 Adjustment disorder with mixed anxiety and depressed mood: Secondary | ICD-10-CM | POA: Diagnosis not present

## 2021-06-19 ENCOUNTER — Telehealth: Payer: Self-pay | Admitting: Neurology

## 2021-06-19 NOTE — Telephone Encounter (Signed)
Pt called states she is showing increased symptoms in her parkinsonism. Pt expressed that she feed herself, dress herself etc. Pt wanting to know what to do, requesting a call back.

## 2021-06-19 NOTE — Telephone Encounter (Signed)
I was able to reach the pt and schedule her with Jinny Blossom NP tomorrow 11/10 at 9 AM arrive 15-30 minutes early. PT very appreciative. Canceled 07/25/21 appt with Amy NP as pt will be seen early and can readdress when to f/u with MM NP.

## 2021-06-19 NOTE — Telephone Encounter (Signed)
I called pt and no answer.  Will try later.  MM had appt 0900 06-19-21

## 2021-06-20 ENCOUNTER — Other Ambulatory Visit: Payer: Self-pay

## 2021-06-20 ENCOUNTER — Encounter: Payer: Self-pay | Admitting: Adult Health

## 2021-06-20 ENCOUNTER — Ambulatory Visit: Payer: PPO | Admitting: Adult Health

## 2021-06-20 VITALS — BP 116/74 | HR 81 | Wt 143.2 lb

## 2021-06-20 DIAGNOSIS — G20 Parkinson's disease: Secondary | ICD-10-CM

## 2021-06-20 DIAGNOSIS — G2581 Restless legs syndrome: Secondary | ICD-10-CM | POA: Diagnosis not present

## 2021-06-20 DIAGNOSIS — M545 Low back pain, unspecified: Secondary | ICD-10-CM | POA: Diagnosis not present

## 2021-06-20 DIAGNOSIS — R5381 Other malaise: Secondary | ICD-10-CM | POA: Diagnosis not present

## 2021-06-20 DIAGNOSIS — M2569 Stiffness of other specified joint, not elsewhere classified: Secondary | ICD-10-CM | POA: Diagnosis not present

## 2021-06-20 DIAGNOSIS — M6281 Muscle weakness (generalized): Secondary | ICD-10-CM | POA: Diagnosis not present
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
## 2021-06-24 DIAGNOSIS — R5381 Other malaise: Secondary | ICD-10-CM | POA: Diagnosis not present

## 2021-06-24 DIAGNOSIS — M545 Low back pain, unspecified: Secondary | ICD-10-CM | POA: Diagnosis not present

## 2021-06-24 DIAGNOSIS — M6281 Muscle weakness (generalized): Secondary | ICD-10-CM | POA: Diagnosis not present

## 2021-06-24 DIAGNOSIS — M2569 Stiffness of other specified joint, not elsewhere classified: Secondary | ICD-10-CM | POA: Diagnosis not present

## 2021-06-25 DIAGNOSIS — M25552 Pain in left hip: Secondary | ICD-10-CM | POA: Diagnosis not present

## 2021-06-27 DIAGNOSIS — R5381 Other malaise: Secondary | ICD-10-CM | POA: Diagnosis not present

## 2021-06-27 DIAGNOSIS — M2569 Stiffness of other specified joint, not elsewhere classified: Secondary | ICD-10-CM | POA: Diagnosis not present

## 2021-06-27 DIAGNOSIS — M545 Low back pain, unspecified: Secondary | ICD-10-CM | POA: Diagnosis not present

## 2021-06-27 DIAGNOSIS — M6281 Muscle weakness (generalized): Secondary | ICD-10-CM | POA: Diagnosis not present

## 2021-07-01 DIAGNOSIS — M545 Low back pain, unspecified: Secondary | ICD-10-CM | POA: Diagnosis not present

## 2021-07-01 DIAGNOSIS — R5381 Other malaise: Secondary | ICD-10-CM | POA: Diagnosis not present

## 2021-07-01 DIAGNOSIS — M6281 Muscle weakness (generalized): Secondary | ICD-10-CM | POA: Diagnosis not present

## 2021-07-01 DIAGNOSIS — M2569 Stiffness of other specified joint, not elsewhere classified: Secondary | ICD-10-CM | POA: Diagnosis not present

## 2021-07-02 DIAGNOSIS — F4323 Adjustment disorder with mixed anxiety and depressed mood: Secondary | ICD-10-CM | POA: Diagnosis not present

## 2021-07-08 DIAGNOSIS — M6281 Muscle weakness (generalized): Secondary | ICD-10-CM | POA: Diagnosis not present

## 2021-07-08 DIAGNOSIS — M545 Low back pain, unspecified: Secondary | ICD-10-CM | POA: Diagnosis not present

## 2021-07-08 DIAGNOSIS — M2569 Stiffness of other specified joint, not elsewhere classified: Secondary | ICD-10-CM | POA: Diagnosis not present

## 2021-07-08 DIAGNOSIS — R5381 Other malaise: Secondary | ICD-10-CM | POA: Diagnosis not present

## 2021-07-11 DIAGNOSIS — M6281 Muscle weakness (generalized): Secondary | ICD-10-CM | POA: Diagnosis not present

## 2021-07-11 DIAGNOSIS — R5381 Other malaise: Secondary | ICD-10-CM | POA: Diagnosis not present

## 2021-07-11 DIAGNOSIS — M2569 Stiffness of other specified joint, not elsewhere classified: Secondary | ICD-10-CM | POA: Diagnosis not present

## 2021-07-11 DIAGNOSIS — M545 Low back pain, unspecified: Secondary | ICD-10-CM | POA: Diagnosis not present

## 2021-07-15 DIAGNOSIS — M545 Low back pain, unspecified: Secondary | ICD-10-CM | POA: Diagnosis not present

## 2021-07-15 DIAGNOSIS — M6281 Muscle weakness (generalized): Secondary | ICD-10-CM | POA: Diagnosis not present

## 2021-07-15 DIAGNOSIS — M2569 Stiffness of other specified joint, not elsewhere classified: Secondary | ICD-10-CM | POA: Diagnosis not present

## 2021-07-15 DIAGNOSIS — R5381 Other malaise: Secondary | ICD-10-CM | POA: Diagnosis not present

## 2021-07-16 DIAGNOSIS — M545 Low back pain, unspecified: Secondary | ICD-10-CM | POA: Diagnosis not present

## 2021-07-16 DIAGNOSIS — M6281 Muscle weakness (generalized): Secondary | ICD-10-CM | POA: Diagnosis not present

## 2021-07-16 DIAGNOSIS — R5381 Other malaise: Secondary | ICD-10-CM | POA: Diagnosis not present

## 2021-07-16 DIAGNOSIS — M2569 Stiffness of other specified joint, not elsewhere classified: Secondary | ICD-10-CM | POA: Diagnosis not present

## 2021-07-18 DIAGNOSIS — R5381 Other malaise: Secondary | ICD-10-CM | POA: Diagnosis not present

## 2021-07-18 DIAGNOSIS — M2569 Stiffness of other specified joint, not elsewhere classified: Secondary | ICD-10-CM | POA: Diagnosis not present

## 2021-07-18 DIAGNOSIS — M545 Low back pain, unspecified: Secondary | ICD-10-CM | POA: Diagnosis not present

## 2021-07-18 DIAGNOSIS — M6281 Muscle weakness (generalized): Secondary | ICD-10-CM | POA: Diagnosis not present

## 2021-07-22 ENCOUNTER — Ambulatory Visit: Payer: PPO | Admitting: Interventional Cardiology

## 2021-07-22 DIAGNOSIS — M6281 Muscle weakness (generalized): Secondary | ICD-10-CM | POA: Diagnosis not present

## 2021-07-22 DIAGNOSIS — M2569 Stiffness of other specified joint, not elsewhere classified: Secondary | ICD-10-CM | POA: Diagnosis not present

## 2021-07-22 DIAGNOSIS — R5381 Other malaise: Secondary | ICD-10-CM | POA: Diagnosis not present

## 2021-07-22 DIAGNOSIS — M545 Low back pain, unspecified: Secondary | ICD-10-CM | POA: Diagnosis not present

## 2021-07-25 ENCOUNTER — Ambulatory Visit: Payer: PPO | Admitting: Family Medicine

## 2021-07-25 DIAGNOSIS — M5432 Sciatica, left side: Secondary | ICD-10-CM | POA: Diagnosis not present

## 2021-07-25 DIAGNOSIS — R5381 Other malaise: Secondary | ICD-10-CM | POA: Diagnosis not present

## 2021-07-25 DIAGNOSIS — M6281 Muscle weakness (generalized): Secondary | ICD-10-CM | POA: Diagnosis not present

## 2021-07-25 DIAGNOSIS — M2569 Stiffness of other specified joint, not elsewhere classified: Secondary | ICD-10-CM | POA: Diagnosis not present

## 2021-07-25 DIAGNOSIS — M545 Low back pain, unspecified: Secondary | ICD-10-CM | POA: Diagnosis not present

## 2021-07-26 DIAGNOSIS — F4323 Adjustment disorder with mixed anxiety and depressed mood: Secondary | ICD-10-CM | POA: Diagnosis not present

## 2021-07-27 DIAGNOSIS — M5432 Sciatica, left side: Secondary | ICD-10-CM | POA: Diagnosis not present

## 2021-07-29 DIAGNOSIS — M6281 Muscle weakness (generalized): Secondary | ICD-10-CM | POA: Diagnosis not present

## 2021-07-29 DIAGNOSIS — M2569 Stiffness of other specified joint, not elsewhere classified: Secondary | ICD-10-CM | POA: Diagnosis not present

## 2021-07-29 DIAGNOSIS — R5381 Other malaise: Secondary | ICD-10-CM | POA: Diagnosis not present

## 2021-07-29 DIAGNOSIS — M545 Low back pain, unspecified: Secondary | ICD-10-CM | POA: Diagnosis not present

## 2021-08-01 ENCOUNTER — Other Ambulatory Visit: Payer: Self-pay | Admitting: Sports Medicine

## 2021-08-01 ENCOUNTER — Ambulatory Visit
Admission: RE | Admit: 2021-08-01 | Discharge: 2021-08-01 | Disposition: A | Discharge status: Home / Self Care | Payer: PPO | Source: Ambulatory Visit | Attending: Sports Medicine | Admitting: Sports Medicine

## 2021-08-01 DIAGNOSIS — M533 Sacrococcygeal disorders, not elsewhere classified: Secondary | ICD-10-CM

## 2021-08-01 DIAGNOSIS — M545 Low back pain, unspecified: Secondary | ICD-10-CM | POA: Diagnosis not present

## 2021-08-01 DIAGNOSIS — R5381 Other malaise: Secondary | ICD-10-CM | POA: Diagnosis not present

## 2021-08-01 DIAGNOSIS — M2569 Stiffness of other specified joint, not elsewhere classified: Secondary | ICD-10-CM | POA: Diagnosis not present

## 2021-08-01 DIAGNOSIS — M6281 Muscle weakness (generalized): Secondary | ICD-10-CM | POA: Diagnosis not present

## 2021-08-08 DIAGNOSIS — M533 Sacrococcygeal disorders, not elsewhere classified: Secondary | ICD-10-CM | POA: Diagnosis not present

## 2021-08-08 DIAGNOSIS — M2569 Stiffness of other specified joint, not elsewhere classified: Secondary | ICD-10-CM | POA: Diagnosis not present

## 2021-08-08 DIAGNOSIS — K5900 Constipation, unspecified: Secondary | ICD-10-CM | POA: Diagnosis not present

## 2021-08-08 DIAGNOSIS — R5381 Other malaise: Secondary | ICD-10-CM | POA: Diagnosis not present

## 2021-08-08 DIAGNOSIS — M545 Low back pain, unspecified: Secondary | ICD-10-CM | POA: Diagnosis not present

## 2021-08-08 DIAGNOSIS — M6281 Muscle weakness (generalized): Secondary | ICD-10-CM | POA: Diagnosis not present

## 2021-08-09 ENCOUNTER — Other Ambulatory Visit: Payer: Self-pay | Admitting: Family Medicine

## 2021-08-09 DIAGNOSIS — M545 Low back pain, unspecified: Secondary | ICD-10-CM

## 2021-08-09 DIAGNOSIS — M533 Sacrococcygeal disorders, not elsewhere classified: Secondary | ICD-10-CM

## 2021-08-09 DIAGNOSIS — G8929 Other chronic pain: Secondary | ICD-10-CM

## 2021-08-15 DIAGNOSIS — M545 Low back pain, unspecified: Secondary | ICD-10-CM | POA: Diagnosis not present

## 2021-08-27 DIAGNOSIS — F4323 Adjustment disorder with mixed anxiety and depressed mood: Secondary | ICD-10-CM | POA: Diagnosis not present

## 2021-08-31 ENCOUNTER — Ambulatory Visit
Admission: RE | Admit: 2021-08-31 | Discharge: 2021-08-31 | Disposition: A | Discharge status: Home / Self Care | Payer: PPO | Source: Ambulatory Visit | Attending: Family Medicine | Admitting: Family Medicine

## 2021-08-31 ENCOUNTER — Other Ambulatory Visit: Payer: Self-pay

## 2021-08-31 DIAGNOSIS — M533 Sacrococcygeal disorders, not elsewhere classified: Secondary | ICD-10-CM

## 2021-08-31 DIAGNOSIS — M545 Low back pain, unspecified: Secondary | ICD-10-CM

## 2021-08-31 DIAGNOSIS — S76312A Strain of muscle, fascia and tendon of the posterior muscle group at thigh level, left thigh, initial encounter: Secondary | ICD-10-CM | POA: Diagnosis not present

## 2021-08-31 DIAGNOSIS — M5127 Other intervertebral disc displacement, lumbosacral region: Secondary | ICD-10-CM | POA: Diagnosis not present

## 2021-08-31 DIAGNOSIS — M24151 Other articular cartilage disorders, right hip: Secondary | ICD-10-CM | POA: Diagnosis not present

## 2021-08-31 DIAGNOSIS — G8929 Other chronic pain: Secondary | ICD-10-CM

## 2021-08-31 DIAGNOSIS — M5126 Other intervertebral disc displacement, lumbar region: Secondary | ICD-10-CM | POA: Diagnosis not present

## 2021-09-03 DIAGNOSIS — N83201 Unspecified ovarian cyst, right side: Secondary | ICD-10-CM | POA: Diagnosis not present

## 2021-09-03 DIAGNOSIS — R9389 Abnormal findings on diagnostic imaging of other specified body structures: Secondary | ICD-10-CM | POA: Diagnosis not present

## 2021-09-04 DIAGNOSIS — R9389 Abnormal findings on diagnostic imaging of other specified body structures: Secondary | ICD-10-CM | POA: Diagnosis not present

## 2021-09-04 DIAGNOSIS — N83201 Unspecified ovarian cyst, right side: Secondary | ICD-10-CM | POA: Diagnosis not present

## 2021-09-05 DIAGNOSIS — M533 Sacrococcygeal disorders, not elsewhere classified: Secondary | ICD-10-CM | POA: Diagnosis not present

## 2021-09-05 DIAGNOSIS — G7249 Other inflammatory and immune myopathies, not elsewhere classified: Secondary | ICD-10-CM | POA: Diagnosis not present

## 2021-09-05 DIAGNOSIS — M545 Low back pain, unspecified: Secondary | ICD-10-CM | POA: Diagnosis not present

## 2021-09-05 DIAGNOSIS — G8929 Other chronic pain: Secondary | ICD-10-CM | POA: Diagnosis not present

## 2021-09-08 DIAGNOSIS — G7249 Other inflammatory and immune myopathies, not elsewhere classified: Secondary | ICD-10-CM | POA: Diagnosis not present

## 2021-09-08 DIAGNOSIS — M545 Low back pain, unspecified: Secondary | ICD-10-CM | POA: Diagnosis not present

## 2021-09-09 DIAGNOSIS — M545 Low back pain, unspecified: Secondary | ICD-10-CM | POA: Diagnosis not present

## 2021-09-09 DIAGNOSIS — G7249 Other inflammatory and immune myopathies, not elsewhere classified: Secondary | ICD-10-CM | POA: Diagnosis not present

## 2021-09-10 DIAGNOSIS — F4323 Adjustment disorder with mixed anxiety and depressed mood: Secondary | ICD-10-CM | POA: Diagnosis not present

## 2021-09-12 ENCOUNTER — Other Ambulatory Visit: Payer: Self-pay

## 2021-09-12 DIAGNOSIS — N9489 Other specified conditions associated with female genital organs and menstrual cycle: Secondary | ICD-10-CM | POA: Diagnosis not present

## 2021-09-12 DIAGNOSIS — R9389 Abnormal findings on diagnostic imaging of other specified body structures: Secondary | ICD-10-CM | POA: Diagnosis not present

## 2021-09-19 DIAGNOSIS — M5459 Other low back pain: Secondary | ICD-10-CM | POA: Diagnosis not present

## 2021-09-24 ENCOUNTER — Encounter: Payer: Self-pay | Admitting: Gynecologic Oncology

## 2021-09-24 DIAGNOSIS — Z8049 Family history of malignant neoplasm of other genital organs: Secondary | ICD-10-CM | POA: Diagnosis not present

## 2021-09-24 DIAGNOSIS — R110 Nausea: Secondary | ICD-10-CM | POA: Diagnosis not present

## 2021-09-24 DIAGNOSIS — F411 Generalized anxiety disorder: Secondary | ICD-10-CM | POA: Diagnosis not present

## 2021-09-24 DIAGNOSIS — M545 Low back pain, unspecified: Secondary | ICD-10-CM | POA: Diagnosis not present

## 2021-09-24 DIAGNOSIS — G8929 Other chronic pain: Secondary | ICD-10-CM | POA: Diagnosis not present

## 2021-09-24 DIAGNOSIS — F4323 Adjustment disorder with mixed anxiety and depressed mood: Secondary | ICD-10-CM | POA: Diagnosis not present

## 2021-09-24 DIAGNOSIS — N9489 Other specified conditions associated with female genital organs and menstrual cycle: Secondary | ICD-10-CM | POA: Diagnosis not present

## 2021-09-27 ENCOUNTER — Other Ambulatory Visit: Payer: Self-pay

## 2021-09-27 ENCOUNTER — Encounter: Payer: Self-pay | Admitting: Gynecologic Oncology

## 2021-09-27 ENCOUNTER — Inpatient Hospital Stay (HOSPITAL_BASED_OUTPATIENT_CLINIC_OR_DEPARTMENT_OTHER): Payer: PPO | Admitting: Gynecologic Oncology

## 2021-09-27 ENCOUNTER — Inpatient Hospital Stay: Payer: PPO | Attending: Gynecologic Oncology | Admitting: Gynecologic Oncology

## 2021-09-27 VITALS — BP 166/78 | HR 79 | Temp 97.9°F | Resp 18 | Ht 62.6 in | Wt 142.8 lb

## 2021-09-27 DIAGNOSIS — N8502 Endometrial intraepithelial neoplasia [EIN]: Secondary | ICD-10-CM

## 2021-09-27 DIAGNOSIS — Z7982 Long term (current) use of aspirin: Secondary | ICD-10-CM | POA: Diagnosis not present

## 2021-09-27 DIAGNOSIS — G20 Parkinson's disease: Secondary | ICD-10-CM | POA: Diagnosis not present

## 2021-09-27 DIAGNOSIS — E785 Hyperlipidemia, unspecified: Secondary | ICD-10-CM | POA: Diagnosis not present

## 2021-09-27 DIAGNOSIS — K5190 Ulcerative colitis, unspecified, without complications: Secondary | ICD-10-CM | POA: Insufficient documentation

## 2021-09-27 DIAGNOSIS — R9389 Abnormal findings on diagnostic imaging of other specified body structures: Secondary | ICD-10-CM

## 2021-09-27 DIAGNOSIS — E7800 Pure hypercholesterolemia, unspecified: Secondary | ICD-10-CM | POA: Diagnosis not present

## 2021-09-27 DIAGNOSIS — K219 Gastro-esophageal reflux disease without esophagitis: Secondary | ICD-10-CM | POA: Diagnosis not present

## 2021-09-27 DIAGNOSIS — R110 Nausea: Secondary | ICD-10-CM | POA: Diagnosis not present

## 2021-09-27 DIAGNOSIS — R102 Pelvic and perineal pain: Secondary | ICD-10-CM | POA: Diagnosis not present

## 2021-09-27 DIAGNOSIS — I10 Essential (primary) hypertension: Secondary | ICD-10-CM | POA: Diagnosis not present

## 2021-09-27 DIAGNOSIS — Z8049 Family history of malignant neoplasm of other genital organs: Secondary | ICD-10-CM | POA: Insufficient documentation

## 2021-09-27 DIAGNOSIS — M533 Sacrococcygeal disorders, not elsewhere classified: Secondary | ICD-10-CM

## 2021-09-27 DIAGNOSIS — M1990 Unspecified osteoarthritis, unspecified site: Secondary | ICD-10-CM | POA: Diagnosis not present

## 2021-09-27 DIAGNOSIS — M8580 Other specified disorders of bone density and structure, unspecified site: Secondary | ICD-10-CM | POA: Diagnosis not present

## 2021-09-27 DIAGNOSIS — K5900 Constipation, unspecified: Secondary | ICD-10-CM | POA: Diagnosis not present

## 2021-09-27 DIAGNOSIS — Z87891 Personal history of nicotine dependence: Secondary | ICD-10-CM | POA: Insufficient documentation

## 2021-09-27 DIAGNOSIS — Z79899 Other long term (current) drug therapy: Secondary | ICD-10-CM | POA: Diagnosis not present

## 2021-09-27 NOTE — H&P (View-Only) (Signed)
GYNECOLOGIC ONCOLOGY NEW PATIENT CONSULTATION   Patient Name: Ashley Neal  Patient Age: 76 y.o. Date of Service: 09/27/2021 Referring Provider: Burman Riis, NP  Primary Care Provider: Faustino Congress, NP Consulting Provider: Jeral Pinch, MD   Assessment/Plan:  Postmenopausal patient with thickened endometrium and biopsy concerning for at least complex atypical hyperplasia.    We reviewed the diagnosis of complex atypical hyperplasia (CAH) and the treatment options, including medical management (Mirena IUD or progesterone PO) or hysterectomy.  Given her age and need for additional sampling, I recommend surgical management.   The patient is a suitable candidate for hysterectomy via a minimally invasive approach to surgery.  Given that she is postmenopausal, a bilateral salpingo-oophorectomy is also recommended.  We reviewed that robotic assistance would be used to complete the surgery.  We discussed that endometrial cancer is detected in about 40% of final uterine pathology specimens from patients with CAH.    We discussed possible options for surgery. Standard of care would be to send the uterus for intraoperative frozen pathology.  If cancer is identified at the time of surgery, additional procedures including lymph node evaluation, is recommended. The other option would be to proceed with sentinel lymph node biopsy. While removal of lymph nodes would be over treatment if no cancer diagnosed on final pathology, we discussed the benefits of sentinel lymph node biopsy to prevent the need for full lymphadenectomy if cancer identified on frozen (and patient meets Mayo criteria for nodal assessment).   We reviewed the sentinel lymph node technique. Risks and benefits of sentinel lymph node biopsy was reviewed. We reviewed the technique and ICG dye. The patient DOES NOT have an iodine allergy or known liver dysfunction. We reviewed the false negative rate (0.4%), and that 3% of  patients with metastatic disease will not have it detected by SLN biopsy in endometrial cancer. A low risk of allergic reaction to the dye, <0.2% for ICG, has been reported. We also discussed that in the case of failed mapping, which occurs 40% of the time, a bilateral or unilateral lymphadenectomy will be performed at the surgeons discretion.   Potential benefits of sentinel nodes including a higher detection rate for metastasis due to ultrastaging and potential reduction in operative morbidity. However, there remains uncertainty as to the role for treatment of micrometastatic disease. Further, the benefit of operative morbidity associated with the SLN technique in endometrial cancer is not yet completely known. In other patient populations (e.g. the cervical cancer population) there has been observed reductions in morbidity with SLN biopsy compared to pelvic lymphadenectomy. Lymphedema, nerve dysfunction and lymphocysts are all potential risks with the SLN technique as with complete lymphadenectomy. Additional risks to the patient include the risk of damage to an internal organ while operating in an altered view (e.g. the black and white image of the robotic fluorescence imaging mode).   After discussing options for surgery, the patient wishes to proceed with sentinel lymph node biopsy.   We discussed plan for a robotic assisted hysterectomy, bilateral salpingo-oophorectomy, sentinel lymph node evaluation, possible lymph node dissection, possible laparotomy. The risks of surgery were discussed in detail and she understands these to include infection; wound separation; hernia; vaginal cuff separation, injury to adjacent organs such as bowel, bladder, blood vessels, ureters and nerves; bleeding which may require blood transfusion; anesthesia risk; thromboembolic events; possible death; unforeseen complications; possible need for re-exploration; medical complications such as heart attack, stroke, pleural  effusion and pneumonia; and, if full lymphadenectomy is performed the risk  of lymphedema and lymphocyst. The patient will receive DVT and antibiotic prophylaxis as indicated. She voiced a clear understanding. She had the opportunity to ask questions. Perioperative instructions were reviewed with her. Prescriptions for post-op medications were sent to her pharmacy of choice.  The patient has had a recent MRI. No definitive evidence of metastatic disease.   A copy of this note was sent to the patient's referring provider.   65 minutes of total time was spent for this patient encounter, including preparation, face-to-face counseling with the patient and coordination of care, and documentation of the encounter.   Jeral Pinch, MD  Division of Gynecologic Oncology  Department of Obstetrics and Gynecology  [facility]  ___________________________________________  Chief Complaint: Chief Complaint  Patient presents with   EIN (endometrial intraepithelial neoplasia)    History of Present Illness:  Ashley Neal is a 76 y.o. y.o. female who is seen in consultation at the request of Burman Riis, NP for an evaluation of thickened endometrium, biopsy suspicion for at least EIN.  Patient reports having pelvic pain starting in August.  This initially happened after she was putting her shoes on and felt her left hip dislocate.  She was able to get her hip joint back in position, but continued to have pain.  She has been followed by orthopedics and ultimately her pelvic pain became back pain and localized to her sacrum.  She has had an ultrasound, MRI, injections in her back, and dry needling.  She has struggled to find relief related to her pain, has tried a number of medications without success.  Most recently, she was given oxycodone which has helped provide significant relief.  After her MRI showed an incidental finding of thickened endometrium, she underwent  pelvic ultrasound and endometrial biopsy.  Endometrial biopsy showed rare glandular fragments with features concerning for at least endometrial hyperplasia.  She denies any vaginal bleeding or discharge with the exception of several days of spotting after her recent biopsy.  She notes appetite has been up and down secondary to pain.  She has intermittent nausea, had 1 episode of emesis recently after taking oxycodone and Xanax at the same time.  She denies any urinary symptoms.  Has developed constipation over a number of months secondary to medication use.  She is using MiraLAX as needed now.  Her mother died from metastatic endometrial cancer.  Patient has a chart history of ulcerative colitis, which we discussed today.  She notes that years ago she was treated for Crohn's disease when she moved to [place].  She denies any evidence of such a disease on colonoscopy or by symptoms since.  The patient is a retired Radio broadcast assistant.  Work-up: MRI of the pelvis performed for low back pain on 08/31/2021 with incidental finding of endometrial thickening up to 1 cm, suboptimally evaluated.  There is no pelvic free fluid.  No adenopathy.  2.5 cm right ovarian cyst noted.  Other MRI findings: no hip fracture, dislocation or avascular necrosis.  There is mild osteoarthritis of bilateral hips.  There is a large high-grade partial-thickness tear of the left hamstring origin.  Partial-thickness tear of the left gluteus medius tendon insertion.  Mild osteoarthritis of bilateral SI joints also noted. Pelvic ultrasound exam on 09/04/2021 shows a uterus measuring 6.4 x 3.1 x 4.2 cm with an endometrial lining of 1.1 cm with fluid.  Irregular appearance of the right ovary with simple follicle measuring up to 5 mm, avascular.  Simple cyst adjacent to the right ovary measures up to 2.7 cm and also avascular.  Left ovary noted to be normal. Endometrial biopsy on 09/12/2021 showed rare atypical glands,  predominantly mucus with few admixed benign endocervical glands.  Although the rare glandular fragments with cribriform pattern and mild cytologic atypia are extremely scant, they are concerning for at least endometrial hyperplasia.  PAST MEDICAL HISTORY:  Past Medical History:  Diagnosis Date   Frequent headaches    GERD (gastroesophageal reflux disease)    High cholesterol    Hyperlipidemia    Hypertension    Osteoarthritis    Osteopenia    Parkinson disease ([place])      PAST SURGICAL HISTORY:  Past Surgical History:  Procedure Laterality Date   CARPAL TUNNEL RELEASE Bilateral 1996/1997   SQUAMOUS CELL CARCINOMA EXCISION Left 2017   Left jawline   TUBAL LIGATION  1980's    OB/GYN HISTORY:  OB History  Gravida Para Term Preterm AB Living  2 2          SAB IAB Ectopic Multiple Live Births               # Outcome Date GA Lbr Len/2nd Weight Sex Delivery Anes PTL Lv  2 Para           1 Para             No LMP recorded. Patient is postmenopausal.  Age at menarche: 27 Age at menopause: 89 Hx of HRT: Short use in the 33s. Hx of STDs: Denies Last pap: 2014, normal History of abnormal pap smears: Denies  SCREENING STUDIES:  Last mammogram: 2021  Last colonoscopy: 2019  MEDICATIONS: Outpatient Encounter Medications as of 09/27/2021  Medication Sig   ALPRAZolam (XANAX) 0.25 MG tablet Take 0.25 mg by mouth 2 (two) times daily as needed for anxiety.   carbidopa-levodopa (SINEMET IR) 25-100 MG tablet TAKE 1 TABLET BY MOUTH FOUR TIMES A DAY   lisinopril (ZESTRIL) 10 MG tablet Take 1 tablet (10 mg total) by mouth daily. May take extra 5 mg (half tablet) daily if BP over 160   Multiple Vitamin (MULTIVITAMIN) tablet Take 1 tablet by mouth daily.   oxyCODONE (OXY IR/ROXICODONE) 5 MG immediate release tablet Take 5 mg by mouth every 6 (six) hours as needed for severe pain.   polyethylene glycol powder (GLYCOLAX/MIRALAX) 17 GM/SCOOP powder Take 17 g by mouth every other day.    [DISCONTINUED] calcium carbonate (OSCAL) 1500 (600 Ca) MG TABS tablet Take 1 tablet by mouth 2 (two) times a week.   [DISCONTINUED] pramipexole (MIRAPEX) 0.125 MG tablet Take 1 tablet (0.125 mg total) by mouth at bedtime. (Patient taking differently: Take 0.125 mg by mouth as needed.)   aspirin EC 81 MG tablet Take 81 mg by mouth daily.   MAGNESIUM PO Take 200 mg by mouth 3 (three) times a week.   No facility-administered encounter medications on file as of 09/27/2021.    ALLERGIES:  Allergies  Allergen Reactions   Codeine Other (See Comments)    Nausea    Tape Other (See Comments)    Certain tapes pull skin off     FAMILY HISTORY:  Family History  Problem Relation Age of Onset   Endometrial cancer Mother    Diabetes Mother    Alzheimer's disease Father    Coronary artery disease Father    Diabetes Sister    COPD Sister    Diabetes Sister    Heart disease Brother  Other Brother        MVA    Parkinson's disease Brother    Heart disease Brother    Alcohol abuse Brother    Heart disease Brother    Parkinson's disease Maternal Aunt    Breast cancer Maternal Aunt    Colon cancer Paternal Aunt    Lung cancer Paternal Uncle    Healthy Son    Healthy Son    Prostate cancer Neg Hx    Pancreatic cancer Neg Hx    Ovarian cancer Neg Hx      SOCIAL HISTORY:  Social Connections: Not on file    REVIEW OF SYSTEMS:  + Back pain, constipation Denies appetite changes, fevers, chills, fatigue, unexplained weight changes. Denies hearing loss, neck lumps or masses, mouth sores, ringing in ears or voice changes. Denies cough or wheezing.  Denies shortness of breath. Denies chest pain or palpitations. Denies leg swelling. Denies abdominal distention, pain, blood in stools, diarrhea, nausea, vomiting, or early satiety. Denies pain with intercourse, dysuria, frequency, hematuria or incontinence. Denies hot flashes, pelvic pain, vaginal bleeding or vaginal discharge.   Denies joint  pain or muscle pain/cramps. Denies itching, rash, or wounds. Denies dizziness, headaches, numbness or seizures. Denies swollen lymph nodes or glands, denies easy bruising or bleeding. Denies anxiety, depression, confusion, or decreased concentration.  Physical Exam:  Vital Signs for this encounter:  Blood pressure (!) 166/78, pulse 79, temperature 97.9 F (36.6 C), temperature source Oral, resp. rate 18, height 5' 2.6" (1.59 m), weight 142 lb 12.8 oz (64.8 kg), SpO2 99 %. Body mass index is 25.62 kg/m. General: Alert, oriented, no acute distress.  HEENT: Normocephalic, atraumatic. Sclera anicteric.  Chest: Clear to auscultation bilaterally. No wheezes, rhonchi, or rales. Cardiovascular: Regular rate and rhythm, no murmurs, rubs, or gallops.  Abdomen: Normoactive bowel sounds. Soft, nondistended, nontender to palpation. No masses or hepatosplenomegaly appreciated. No palpable fluid wave.  Extremities: Grossly normal range of motion. Warm, well perfused. No edema bilaterally.  Skin: No rashes or lesions.  Lymphatics: No cervical, supraclavicular, or inguinal adenopathy.  GU:  Normal external female genitalia. No lesions. No discharge or bleeding.             Bladder/urethra:  No lesions or masses, well supported bladder             Vagina: Mildly atrophic, no lesions or masses.             Cervix: Normal appearing, no lesions.  Atrophic.             Uterus: Small, mobile, no parametrial involvement or nodularity.             Adnexa: No masses appreciated.  Rectal: Deferred.  LABORATORY AND RADIOLOGIC DATA:  Outside medical records were reviewed to synthesize the above history, along with the history and physical obtained during the visit.   Lab Results  Component Value Date   WBC 5.3 09/18/2014   HGB 14.1 09/18/2014   HCT 40.9 09/18/2014   PLT 227 09/18/2014   GLUCOSE 98 09/18/2014   NA 142 09/18/2014   K 3.7 09/18/2014   CL 106 09/18/2014   CREATININE 0.87 09/18/2014   BUN 9  09/18/2014   CO2 28 09/18/2014

## 2021-09-27 NOTE — Progress Notes (Signed)
GYNECOLOGIC ONCOLOGY NEW PATIENT CONSULTATION   Patient Name: Ashley Neal  Patient Age: 76 y.o. Date of Service: 09/27/2021 Referring Provider: Burman Riis, NP  Primary Care Provider: Faustino Congress, NP Consulting Provider: Jeral Pinch, MD   Assessment/Plan:  Postmenopausal patient with thickened endometrium and biopsy concerning for at least complex atypical hyperplasia.    We reviewed the diagnosis of complex atypical hyperplasia (CAH) and the treatment options, including medical management (Mirena IUD or progesterone PO) or hysterectomy.  Given her age and need for additional sampling, I recommend surgical management.   The patient is a suitable candidate for hysterectomy via a minimally invasive approach to surgery.  Given that she is postmenopausal, a bilateral salpingo-oophorectomy is also recommended.  We reviewed that robotic assistance would be used to complete the surgery.  We discussed that endometrial cancer is detected in about 40% of final uterine pathology specimens from patients with CAH.    We discussed possible options for surgery. Standard of care would be to send the uterus for intraoperative frozen pathology.  If cancer is identified at the time of surgery, additional procedures including lymph node evaluation, is recommended. The other option would be to proceed with sentinel lymph node biopsy. While removal of lymph nodes would be over treatment if no cancer diagnosed on final pathology, we discussed the benefits of sentinel lymph node biopsy to prevent the need for full lymphadenectomy if cancer identified on frozen (and patient meets Mayo criteria for nodal assessment).   We reviewed the sentinel lymph node technique. Risks and benefits of sentinel lymph node biopsy was reviewed. We reviewed the technique and ICG dye. The patient DOES NOT have an iodine allergy or known liver dysfunction. We reviewed the false negative rate (0.4%), and that 3% of  patients with metastatic disease will not have it detected by SLN biopsy in endometrial cancer. A low risk of allergic reaction to the dye, <0.2% for ICG, has been reported. We also discussed that in the case of failed mapping, which occurs 40% of the time, a bilateral or unilateral lymphadenectomy will be performed at the surgeons discretion.   Potential benefits of sentinel nodes including a higher detection rate for metastasis due to ultrastaging and potential reduction in operative morbidity. However, there remains uncertainty as to the role for treatment of micrometastatic disease. Further, the benefit of operative morbidity associated with the SLN technique in endometrial cancer is not yet completely known. In other patient populations (e.g. the cervical cancer population) there has been observed reductions in morbidity with SLN biopsy compared to pelvic lymphadenectomy. Lymphedema, nerve dysfunction and lymphocysts are all potential risks with the SLN technique as with complete lymphadenectomy. Additional risks to the patient include the risk of damage to an internal organ while operating in an altered view (e.g. the black and white image of the robotic fluorescence imaging mode).   After discussing options for surgery, the patient wishes to proceed with sentinel lymph node biopsy.   We discussed plan for a robotic assisted hysterectomy, bilateral salpingo-oophorectomy, sentinel lymph node evaluation, possible lymph node dissection, possible laparotomy. The risks of surgery were discussed in detail and she understands these to include infection; wound separation; hernia; vaginal cuff separation, injury to adjacent organs such as bowel, bladder, blood vessels, ureters and nerves; bleeding which may require blood transfusion; anesthesia risk; thromboembolic events; possible death; unforeseen complications; possible need for re-exploration; medical complications such as heart attack, stroke, pleural  effusion and pneumonia; and, if full lymphadenectomy is performed the risk  of lymphedema and lymphocyst. The patient will receive DVT and antibiotic prophylaxis as indicated. She voiced a clear understanding. She had the opportunity to ask questions. Perioperative instructions were reviewed with her. Prescriptions for post-op medications were sent to her pharmacy of choice.  The patient has had a recent MRI. No definitive evidence of metastatic disease.   A copy of this note was sent to the patient's referring provider.   65 minutes of total time was spent for this patient encounter, including preparation, face-to-face counseling with the patient and coordination of care, and documentation of the encounter.   Jeral Pinch, MD  Division of Gynecologic Oncology  Department of Obstetrics and Gynecology  [facility]  ___________________________________________  Chief Complaint: Chief Complaint  Patient presents with   EIN (endometrial intraepithelial neoplasia)    History of Present Illness:  Ashley Neal is a 76 y.o. y.o. female who is seen in consultation at the request of Burman Riis, NP for an evaluation of thickened endometrium, biopsy suspicion for at least EIN.  Patient reports having pelvic pain starting in August.  This initially happened after she was putting her shoes on and felt her left hip dislocate.  She was able to get her hip joint back in position, but continued to have pain.  She has been followed by orthopedics and ultimately her pelvic pain became back pain and localized to her sacrum.  She has had an ultrasound, MRI, injections in her back, and dry needling.  She has struggled to find relief related to her pain, has tried a number of medications without success.  Most recently, she was given oxycodone which has helped provide significant relief.  After her MRI showed an incidental finding of thickened endometrium, she underwent  pelvic ultrasound and endometrial biopsy.  Endometrial biopsy showed rare glandular fragments with features concerning for at least endometrial hyperplasia.  She denies any vaginal bleeding or discharge with the exception of several days of spotting after her recent biopsy.  She notes appetite has been up and down secondary to pain.  She has intermittent nausea, had 1 episode of emesis recently after taking oxycodone and Xanax at the same time.  She denies any urinary symptoms.  Has developed constipation over a number of months secondary to medication use.  She is using MiraLAX as needed now.  Her mother died from metastatic endometrial cancer.  Patient has a chart history of ulcerative colitis, which we discussed today.  She notes that years ago she was treated for Crohn's disease when she moved to [place].  She denies any evidence of such a disease on colonoscopy or by symptoms since.  The patient is a retired Radio broadcast assistant.  Work-up: MRI of the pelvis performed for low back pain on 08/31/2021 with incidental finding of endometrial thickening up to 1 cm, suboptimally evaluated.  There is no pelvic free fluid.  No adenopathy.  2.5 cm right ovarian cyst noted.  Other MRI findings: no hip fracture, dislocation or avascular necrosis.  There is mild osteoarthritis of bilateral hips.  There is a large high-grade partial-thickness tear of the left hamstring origin.  Partial-thickness tear of the left gluteus medius tendon insertion.  Mild osteoarthritis of bilateral SI joints also noted. Pelvic ultrasound exam on 09/04/2021 shows a uterus measuring 6.4 x 3.1 x 4.2 cm with an endometrial lining of 1.1 cm with fluid.  Irregular appearance of the right ovary with simple follicle measuring up to 5 mm, avascular.  Simple cyst adjacent to the right ovary measures up to 2.7 cm and also avascular.  Left ovary noted to be normal. Endometrial biopsy on 09/12/2021 showed rare atypical glands,  predominantly mucus with few admixed benign endocervical glands.  Although the rare glandular fragments with cribriform pattern and mild cytologic atypia are extremely scant, they are concerning for at least endometrial hyperplasia.  PAST MEDICAL HISTORY:  Past Medical History:  Diagnosis Date   Frequent headaches    GERD (gastroesophageal reflux disease)    High cholesterol    Hyperlipidemia    Hypertension    Osteoarthritis    Osteopenia    Parkinson disease ([place])      PAST SURGICAL HISTORY:  Past Surgical History:  Procedure Laterality Date   CARPAL TUNNEL RELEASE Bilateral 1996/1997   SQUAMOUS CELL CARCINOMA EXCISION Left 2017   Left jawline   TUBAL LIGATION  1980's    OB/GYN HISTORY:  OB History  Gravida Para Term Preterm AB Living  2 2          SAB IAB Ectopic Multiple Live Births               # Outcome Date GA Lbr Len/2nd Weight Sex Delivery Anes PTL Lv  2 Para           1 Para             No LMP recorded. Patient is postmenopausal.  Age at menarche: 73 Age at menopause: 44 Hx of HRT: Short use in the 52s. Hx of STDs: Denies Last pap: 2014, normal History of abnormal pap smears: Denies  SCREENING STUDIES:  Last mammogram: 2021  Last colonoscopy: 2019  MEDICATIONS: Outpatient Encounter Medications as of 09/27/2021  Medication Sig   ALPRAZolam (XANAX) 0.25 MG tablet Take 0.25 mg by mouth 2 (two) times daily as needed for anxiety.   carbidopa-levodopa (SINEMET IR) 25-100 MG tablet TAKE 1 TABLET BY MOUTH FOUR TIMES A DAY   lisinopril (ZESTRIL) 10 MG tablet Take 1 tablet (10 mg total) by mouth daily. May take extra 5 mg (half tablet) daily if BP over 160   Multiple Vitamin (MULTIVITAMIN) tablet Take 1 tablet by mouth daily.   oxyCODONE (OXY IR/ROXICODONE) 5 MG immediate release tablet Take 5 mg by mouth every 6 (six) hours as needed for severe pain.   polyethylene glycol powder (GLYCOLAX/MIRALAX) 17 GM/SCOOP powder Take 17 g by mouth every other day.    [DISCONTINUED] calcium carbonate (OSCAL) 1500 (600 Ca) MG TABS tablet Take 1 tablet by mouth 2 (two) times a week.   [DISCONTINUED] pramipexole (MIRAPEX) 0.125 MG tablet Take 1 tablet (0.125 mg total) by mouth at bedtime. (Patient taking differently: Take 0.125 mg by mouth as needed.)   aspirin EC 81 MG tablet Take 81 mg by mouth daily.   MAGNESIUM PO Take 200 mg by mouth 3 (three) times a week.   No facility-administered encounter medications on file as of 09/27/2021.    ALLERGIES:  Allergies  Allergen Reactions   Codeine Other (See Comments)    Nausea    Tape Other (See Comments)    Certain tapes pull skin off     FAMILY HISTORY:  Family History  Problem Relation Age of Onset   Endometrial cancer Mother    Diabetes Mother    Alzheimer's disease Father    Coronary artery disease Father    Diabetes Sister    COPD Sister    Diabetes Sister    Heart disease Brother  Other Brother        MVA    Parkinson's disease Brother    Heart disease Brother    Alcohol abuse Brother    Heart disease Brother    Parkinson's disease Maternal Aunt    Breast cancer Maternal Aunt    Colon cancer Paternal Aunt    Lung cancer Paternal Uncle    Healthy Son    Healthy Son    Prostate cancer Neg Hx    Pancreatic cancer Neg Hx    Ovarian cancer Neg Hx      SOCIAL HISTORY:  Social Connections: Not on file    REVIEW OF SYSTEMS:  + Back pain, constipation Denies appetite changes, fevers, chills, fatigue, unexplained weight changes. Denies hearing loss, neck lumps or masses, mouth sores, ringing in ears or voice changes. Denies cough or wheezing.  Denies shortness of breath. Denies chest pain or palpitations. Denies leg swelling. Denies abdominal distention, pain, blood in stools, diarrhea, nausea, vomiting, or early satiety. Denies pain with intercourse, dysuria, frequency, hematuria or incontinence. Denies hot flashes, pelvic pain, vaginal bleeding or vaginal discharge.   Denies joint  pain or muscle pain/cramps. Denies itching, rash, or wounds. Denies dizziness, headaches, numbness or seizures. Denies swollen lymph nodes or glands, denies easy bruising or bleeding. Denies anxiety, depression, confusion, or decreased concentration.  Physical Exam:  Vital Signs for this encounter:  Blood pressure (!) 166/78, pulse 79, temperature 97.9 F (36.6 C), temperature source Oral, resp. rate 18, height 5' 2.6" (1.59 m), weight 142 lb 12.8 oz (64.8 kg), SpO2 99 %. Body mass index is 25.62 kg/m. General: Alert, oriented, no acute distress.  HEENT: Normocephalic, atraumatic. Sclera anicteric.  Chest: Clear to auscultation bilaterally. No wheezes, rhonchi, or rales. Cardiovascular: Regular rate and rhythm, no murmurs, rubs, or gallops.  Abdomen: Normoactive bowel sounds. Soft, nondistended, nontender to palpation. No masses or hepatosplenomegaly appreciated. No palpable fluid wave.  Extremities: Grossly normal range of motion. Warm, well perfused. No edema bilaterally.  Skin: No rashes or lesions.  Lymphatics: No cervical, supraclavicular, or inguinal adenopathy.  GU:  Normal external female genitalia. No lesions. No discharge or bleeding.             Bladder/urethra:  No lesions or masses, well supported bladder             Vagina: Mildly atrophic, no lesions or masses.             Cervix: Normal appearing, no lesions.  Atrophic.             Uterus: Small, mobile, no parametrial involvement or nodularity.             Adnexa: No masses appreciated.  Rectal: Deferred.  LABORATORY AND RADIOLOGIC DATA:  Outside medical records were reviewed to synthesize the above history, along with the history and physical obtained during the visit.   Lab Results  Component Value Date   WBC 5.3 09/18/2014   HGB 14.1 09/18/2014   HCT 40.9 09/18/2014   PLT 227 09/18/2014   GLUCOSE 98 09/18/2014   NA 142 09/18/2014   K 3.7 09/18/2014   CL 106 09/18/2014   CREATININE 0.87 09/18/2014   BUN 9  09/18/2014   CO2 28 09/18/2014

## 2021-09-27 NOTE — Patient Instructions (Addendum)
Preparing for your Surgery  Plan for surgery on October 15, 2021 with Dr. Jeral Pinch at [facility] will be scheduled for robotic assisted total laparoscopic hysterectomy (removal of the uterus and cervix), bilateral salpingo-oophorectomy (removal of both ovaries and fallopian tubes), sentinel lymph node biopsy, possible lymph node dissection, possible laparotomy (larger incision on your abdomen if needed).   Pre-operative Testing -You will receive a phone call from presurgical testing at [facility] to arrange for a pre-operative appointment and lab work.  -Bring your insurance card, copy of an advanced directive if applicable, medication list  -At that visit, you will be asked to sign a consent for a possible blood transfusion in case a transfusion becomes necessary during surgery.  The need for a blood transfusion is rare but having consent is a necessary part of your care.     -You are fine to keep taking your baby aspirin (81 mg) with your last dose being the Donna AM.  -Do not take supplements such as fish oil (omega 3), red yeast rice, turmeric before your surgery. You want to avoid medications with aspirin in them including headache powders such as BC or Goody's), Excedrin migraine.  Day Before Surgery at [place] will be asked to take in a light diet the day before surgery. You will be advised you can have clear liquids up until 3 hours before your surgery.    Eat a light diet the day before surgery.  Examples including soups, broths, toast, yogurt, mashed potatoes.  AVOID GAS PRODUCING FOODS. Things to avoid include carbonated beverages (fizzy beverages, sodas), raw fruits and raw vegetables (uncooked), or beans.   If your bowels are filled with gas, your surgeon will have difficulty visualizing your pelvic organs which increases your surgical risks.  Your role in recovery Your role is to become active as soon as directed by your doctor,  while still giving yourself time to heal.  Rest when you feel tired. You will be asked to do the following in order to speed your recovery:  - Cough and breathe deeply. This helps to clear and expand your lungs and can prevent pneumonia after surgery.  - [place]. Do mild physical activity. Walking or moving your legs help your circulation and body functions return to normal. Do not try to get up or walk alone the first time after surgery.   -If you develop swelling on one leg or the other, pain in the back of your leg, redness/warmth in one of your legs, please call the office or go to the Emergency Room to have a doppler to rule out a blood clot. For shortness of breath, chest pain-seek care in the Emergency Room as soon as possible. - Actively manage your pain. Managing your pain lets you move in comfort. We will ask you to rate your pain on a scale of zero to 10. It is your responsibility to tell your doctor or nurse where and how much you hurt so your pain can be treated.  Special Considerations -If you are diabetic, you may be placed on insulin after surgery to have closer control over your blood sugars to promote healing and recovery.  This does not mean that you will be discharged on insulin.  If applicable, your oral antidiabetics will be resumed when you are tolerating a solid diet.  -Your final pathology results from surgery should be available around one week after surgery and the  results will be relayed to you when available.  -Dr. Lahoma Crocker is the surgeon that assists your GYN Oncologist with surgery.  If you end up staying the night, the next day after your surgery you will either see Dr. Berline Lopes or Dr. Lahoma Crocker.  -FMLA forms can be faxed to [phone] and please allow 5-7 business days for completion.  Pain Management After Surgery -You will be prescribed oxycodone for after surgery pain. We will check with your PCP, since she is prescribing  your pain medication now.  -Make sure that you have Tylenol and Ibuprofen at home to use on a regular basis after surgery for pain control. We recommend alternating the medications every hour to six hours since they work differently and are processed in the body differently for pain relief.  -Review the attached handout on narcotic use and their risks and side effects.   Bowel Regimen -You can continue use of Miralax you have at home and increase the frequency after surgery to at least once a day.  It is important to prevent constipation and drink adequate amounts of liquids.   Risks of Surgery Risks of surgery are low but include bleeding, infection, damage to surrounding structures, re-operation, blood clots, and very rarely death.   Blood Transfusion Information (For the consent to be signed before surgery)  We will be checking your blood type before surgery so in case of emergencies, we will know what type of blood you would need.                                            WHAT IS A BLOOD TRANSFUSION?  A transfusion is the replacement of blood or some of its parts. Blood is made up of multiple cells which provide different functions. Red blood cells carry oxygen and are used for blood loss replacement. White blood cells fight against infection. Platelets control bleeding. Plasma helps clot blood. Other blood products are available for specialized needs, such as hemophilia or other clotting disorders. BEFORE THE TRANSFUSION  Who gives blood for transfusions?  You may be able to donate blood to be used at a later date on yourself (autologous donation). Relatives can be asked to donate blood. This is generally not any safer than if you have received blood from a stranger. The same precautions are taken to ensure safety when a relative's blood is donated. Healthy volunteers who are fully evaluated to make sure their blood is safe. This is blood bank blood. Transfusion therapy is the  safest it has ever been in the practice of medicine. Before blood is taken from a donor, a complete history is taken to make sure that person has no history of diseases nor engages in risky social behavior (examples are intravenous drug use or sexual activity with multiple partners). The donor's travel history is screened to minimize risk of transmitting infections, such as malaria. The donated blood is tested for signs of infectious diseases, such as HIV and hepatitis. The blood is then tested to be sure it is compatible with you in order to minimize the chance of a transfusion reaction. If you or a relative donates blood, this is often done in anticipation of surgery and is not appropriate for emergency situations. It takes many days to process the donated blood. RISKS AND COMPLICATIONS Although transfusion therapy is very safe and saves many lives, the main  dangers of transfusion include:  Getting an infectious disease. Developing a transfusion reaction. This is an allergic reaction to something in the blood you were given. Every precaution is taken to prevent this. The decision to have a blood transfusion has been considered carefully by your caregiver before blood is given. Blood is not given unless the benefits outweigh the risks.  AFTER SURGERY INSTRUCTIONS  Return to work: 4-6 weeks if applicable  Activity: 1. Be up and out of the bed during the day.  Take a nap if needed.  You may walk up steps but be careful and use the hand rail.  Stair climbing will tire you more than you think, you may need to stop part way and rest.   2. No lifting or straining for 6 weeks over 10 pounds. No pushing, pulling, straining for 6 weeks.  3. No driving for around 1 week(s).  Do not drive if you are taking narcotic pain medicine and make sure that your reaction time has returned.   4. You can shower as soon as the next day after surgery. Shower daily.  Use your regular soap and water (not directly on the  incision) and pat your incision(s) dry afterwards; don't rub.  No tub baths or submerging your body in water until cleared by your surgeon. If you have the soap that was given to you by pre-surgical testing that was used before surgery, you do not need to use it afterwards because this can irritate your incisions.   5. No sexual activity and nothing in the vagina for 8 weeks.  6. You may experience a small amount of clear drainage from your incisions, which is normal.  If the drainage persists, increases, or changes color please call the office.  7. Do not use creams, lotions, or ointments such as neosporin on your incisions after surgery until advised by your surgeon because they can cause removal of the dermabond glue on your incisions.    8. You may experience vaginal spotting after surgery or around the 6-8 week mark from surgery when the stitches at the top of the vagina begin to dissolve.  The spotting is normal but if you experience heavy bleeding, call our office.  9. Take Tylenol or ibuprofen (if you are able to take these medications) first for pain and only use narcotic pain medication for severe pain not relieved by the Tylenol or Ibuprofen.  Monitor your Tylenol intake to a max of 4,000 mg in a 24 hour period. You can alternate these medications after surgery.  Diet: 1. Low sodium Heart Healthy Diet is recommended but you are cleared to resume your normal (before surgery) diet after your procedure.  2. It is safe to use a laxative, such as Miralax or Colace, if you have difficulty moving your bowels. You have been prescribed Sennakot-S to take at bedtime every evening after surgery to keep bowel movements regular and to prevent constipation.    Wound Care: 1. Keep clean and dry.  Shower daily.  Reasons to call the Doctor: Fever - Oral temperature greater than 100.4 degrees Fahrenheit Foul-smelling vaginal discharge Difficulty urinating Nausea and vomiting Increased pain at the  site of the incision that is unrelieved with pain medicine. Difficulty breathing with or without chest pain New calf pain especially if only on one side Sudden, continuing increased vaginal bleeding with or without clots.   Contacts: For questions or concerns you should contact:  Dr. Jeral Pinch at [place], NP at  [phone]  After Hours: call [phone] and have the GYN Oncologist paged/contacted (after 5 pm or on the weekends).  Messages sent via mychart are for non-urgent matters and are not responded to after hours so for urgent needs, please call the after hours number.

## 2021-09-27 NOTE — Progress Notes (Signed)
Patient here for new patient consultation with Dr. Jeral Pinch and for a pre-operative discussion prior to her scheduled surgery on October 15, 2021. She is scheduled for robotic assisted total laparoscopic hysterectomy, bilateral salpingo-oophorectomy, sentinel lymph node biopsy, possible lymph node dissection, possible laparotomy. The surgery was discussed in detail.  See after visit summary for additional details. Visual aids used to discuss items related to surgery including sequential compression stockings, foley catheter, IV pump, multi-modal pain regimen including tylenol, photo of the surgical robot, female reproductive system to discuss surgery in detail.      Discussed post-op pain management in detail including the aspects of the enhanced recovery pathway.  She currently is being prescribed oxycodone from her PCP. We discussed the use of tylenol post-op and to monitor for a maximum of 4,000 mg in a 24 hour period. She will plan to continue use of Miralax at home. Discussed bowel regimen in detail.     Discussed the use of heparin pre-op, SCDs, and measures to take at home to prevent DVT including frequent mobility.  Reportable signs and symptoms of DVT discussed. Post-operative instructions discussed and expectations for after surgery. Incisional care discussed as well including reportable signs and symptoms including erythema, drainage, wound separation.  She had stopped taking her aspirin 81 mg when she had her recent biopsy and she has not started this back.     10 minutes spent with the patient.  Verbalizing understanding of material discussed. No needs or concerns voiced at the end of the visit.   Advised patient to call for any needs.    This appointment is included in the global surgical bundle as pre-operative teaching and has no charge.

## 2021-09-27 NOTE — Patient Instructions (Signed)
Preparing for your Surgery   Plan for surgery on October 15, 2021 with Dr. Jeral Pinch at [place] will be scheduled for robotic assisted total laparoscopic hysterectomy (removal of the uterus and cervix), bilateral salpingo-oophorectomy (removal of both ovaries and fallopian tubes), sentinel lymph node biopsy, possible lymph node dissection, possible laparotomy (larger incision on your abdomen if needed).    Pre-operative Testing -You will receive a phone call from presurgical testing at [facility] to arrange for a pre-operative appointment and lab work.   -Bring your insurance card, copy of an advanced directive if applicable, medication list   -At that visit, you will be asked to sign a consent for a possible blood transfusion in case a transfusion becomes necessary during surgery.  The need for a blood transfusion is rare but having consent is a necessary part of your care.      -You are fine to keep taking your baby aspirin (81 mg) with your last dose being the [place] AM.   -Do not take supplements such as fish oil (omega 3), red yeast rice, turmeric before your surgery. You want to avoid medications with aspirin in them including headache powders such as BC or Goody's), Excedrin migraine.   Day Before Surgery at [facility] will be asked to take in a light diet the day before surgery. You will be advised you can have clear liquids up until 3 hours before your surgery.     Eat a light diet the day before surgery.  Examples including soups, broths, toast, yogurt, mashed potatoes.  AVOID GAS PRODUCING FOODS. Things to avoid include carbonated beverages (fizzy beverages, sodas), raw fruits and raw vegetables (uncooked), or beans.    If your bowels are filled with gas, your surgeon will have difficulty visualizing your pelvic organs which increases your surgical risks.   Your role in recovery Your role is to become active as soon as directed by  your doctor, while still giving yourself time to heal.  Rest when you feel tired. You will be asked to do the following in order to speed your recovery:   - Cough and breathe deeply. This helps to clear and expand your lungs and can prevent pneumonia after surgery.  - [place]. Do mild physical activity. Walking or moving your legs help your circulation and body functions return to normal. Do not try to get up or walk alone the first time after surgery.   -If you develop swelling on one leg or the other, pain in the back of your leg, redness/warmth in one of your legs, please call the office or go to the Emergency Room to have a doppler to rule out a blood clot. For shortness of breath, chest pain-seek care in the Emergency Room as soon as possible. - Actively manage your pain. Managing your pain lets you move in comfort. We will ask you to rate your pain on a scale of zero to 10. It is your responsibility to tell your doctor or nurse where and how much you hurt so your pain can be treated.   Special Considerations -If you are diabetic, you may be placed on insulin after surgery to have closer control over your blood sugars to promote healing and recovery.  This does not mean that you will be discharged on insulin.  If applicable, your oral antidiabetics will be resumed when you are tolerating a solid diet.   -Your final pathology  results from surgery should be available around one week after surgery and the results will be relayed to you when available.   -Dr. Lahoma Crocker is the surgeon that assists your GYN Oncologist with surgery.  If you end up staying the night, the next day after your surgery you will either see Dr. Berline Lopes or Dr. Lahoma Crocker.   -FMLA forms can be faxed to [phone] and please allow 5-7 business days for completion.   Pain Management After Surgery -You will be prescribed oxycodone for after surgery pain. We will check with your PCP, since  she is prescribing your pain medication now.   -Make sure that you have Tylenol and Ibuprofen at home to use on a regular basis after surgery for pain control. We recommend alternating the medications every hour to six hours since they work differently and are processed in the body differently for pain relief.   -Review the attached handout on narcotic use and their risks and side effects.    Bowel Regimen -You can continue use of Miralax you have at home and increase the frequency after surgery to at least once a day.  It is important to prevent constipation and drink adequate amounts of liquids.    Risks of Surgery Risks of surgery are low but include bleeding, infection, damage to surrounding structures, re-operation, blood clots, and very rarely death.     Blood Transfusion Information (For the consent to be signed before surgery)   We will be checking your blood type before surgery so in case of emergencies, we will know what type of blood you would need.                                             WHAT IS A BLOOD TRANSFUSION?   A transfusion is the replacement of blood or some of its parts. Blood is made up of multiple cells which provide different functions. Red blood cells carry oxygen and are used for blood loss replacement. White blood cells fight against infection. Platelets control bleeding. Plasma helps clot blood. Other blood products are available for specialized needs, such as hemophilia or other clotting disorders. BEFORE THE TRANSFUSION  Who gives blood for transfusions?  You may be able to donate blood to be used at a later date on yourself (autologous donation). Relatives can be asked to donate blood. This is generally not any safer than if you have received blood from a stranger. The same precautions are taken to ensure safety when a relative's blood is donated. Healthy volunteers who are fully evaluated to make sure their blood is safe. This is blood bank  blood. Transfusion therapy is the safest it has ever been in the practice of medicine. Before blood is taken from a donor, a complete history is taken to make sure that person has no history of diseases nor engages in risky social behavior (examples are intravenous drug use or sexual activity with multiple partners). The donor's travel history is screened to minimize risk of transmitting infections, such as malaria. The donated blood is tested for signs of infectious diseases, such as HIV and hepatitis. The blood is then tested to be sure it is compatible with you in order to minimize the chance of a transfusion reaction. If you or a relative donates blood, this is often done in anticipation of surgery and is not appropriate for emergency  situations. It takes many days to process the donated blood. RISKS AND COMPLICATIONS Although transfusion therapy is very safe and saves many lives, the main dangers of transfusion include:  Getting an infectious disease. Developing a transfusion reaction. This is an allergic reaction to something in the blood you were given. Every precaution is taken to prevent this. The decision to have a blood transfusion has been considered carefully by your caregiver before blood is given. Blood is not given unless the benefits outweigh the risks.   AFTER SURGERY INSTRUCTIONS   Return to work: 4-6 weeks if applicable   Activity: 1. Be up and out of the bed during the day.  Take a nap if needed.  You may walk up steps but be careful and use the hand rail.  Stair climbing will tire you more than you think, you may need to stop part way and rest.    2. No lifting or straining for 6 weeks over 10 pounds. No pushing, pulling, straining for 6 weeks.   3. No driving for around 1 week(s).  Do not drive if you are taking narcotic pain medicine and make sure that your reaction time has returned.    4. You can shower as soon as the next day after surgery. Shower daily.  Use your  regular soap and water (not directly on the incision) and pat your incision(s) dry afterwards; don't rub.  No tub baths or submerging your body in water until cleared by your surgeon. If you have the soap that was given to you by pre-surgical testing that was used before surgery, you do not need to use it afterwards because this can irritate your incisions.    5. No sexual activity and nothing in the vagina for 8 weeks.   6. You may experience a small amount of clear drainage from your incisions, which is normal.  If the drainage persists, increases, or changes color please call the office.   7. Do not use creams, lotions, or ointments such as neosporin on your incisions after surgery until advised by your surgeon because they can cause removal of the dermabond glue on your incisions.     8. You may experience vaginal spotting after surgery or around the 6-8 week mark from surgery when the stitches at the top of the vagina begin to dissolve.  The spotting is normal but if you experience heavy bleeding, call our office.   9. Take Tylenol or ibuprofen (if you are able to take these medications) first for pain and only use narcotic pain medication for severe pain not relieved by the Tylenol or Ibuprofen.  Monitor your Tylenol intake to a max of 4,000 mg in a 24 hour period. You can alternate these medications after surgery.   Diet: 1. Low sodium Heart Healthy Diet is recommended but you are cleared to resume your normal (before surgery) diet after your procedure.   2. It is safe to use a laxative, such as Miralax or Colace, if you have difficulty moving your bowels. You have been prescribed Sennakot-S to take at bedtime every evening after surgery to keep bowel movements regular and to prevent constipation.     Wound Care: 1. Keep clean and dry.  Shower daily.   Reasons to call the Doctor: Fever - Oral temperature greater than 100.4 degrees Fahrenheit Foul-smelling vaginal discharge Difficulty  urinating Nausea and vomiting Increased pain at the site of the incision that is unrelieved with pain medicine. Difficulty breathing with or without chest  pain New calf pain especially if only on one side Sudden, continuing increased vaginal bleeding with or without clots.   Contacts: For questions or concerns you should contact:   Dr. Jeral Pinch at [phone]   Joylene John, NP at [phone]   After Hours: call [phone] and have the GYN Oncologist paged/contacted (after 5 pm or on the weekends).   Messages sent via mychart are for non-urgent matters and are not responded to after hours so for urgent needs, please call the after hours number.

## 2021-09-30 ENCOUNTER — Telehealth: Payer: Self-pay | Admitting: *Deleted

## 2021-09-30 NOTE — Telephone Encounter (Signed)
Per Dr Tucker fax records and surgical optimization form to the patient's PCP  

## 2021-10-01 ENCOUNTER — Telehealth: Payer: Self-pay | Admitting: *Deleted

## 2021-10-01 ENCOUNTER — Other Ambulatory Visit: Payer: Self-pay | Admitting: Gynecologic Oncology

## 2021-10-01 DIAGNOSIS — G8918 Other acute postprocedural pain: Secondary | ICD-10-CM

## 2021-10-01 DIAGNOSIS — N8502 Endometrial intraepithelial neoplasia [EIN]: Secondary | ICD-10-CM

## 2021-10-01 MED ORDER — OXYCODONE HCL 5 MG PO TABS: 5.0000 mg | ORAL_TABLET | Freq: Four times a day (QID) | ORAL | 0 refills | Status: DC | PRN

## 2021-10-01 NOTE — Telephone Encounter (Addendum)
Spoke with Ashley Neal this afternoon regarding rescheduling her surgery appointment. Pt in agreement to move her surgery appointment to 10-08-2021. Also informed pt that we will be putting in a prescription for pain medication the day before her surgery and she can take the pain medication to manage her pain after surgery.   Pt mentioned her constipation from her pain medications. Ashley John, NP made aware. Advise pt to take over the counter miralax and/or a laxative, and to get plenty of fluids and fibers in diet. Pt verbalized understanding and agreement.

## 2021-10-01 NOTE — Telephone Encounter (Signed)
Completed surgical optimization form received from patients PCP. Form successfully faxed to pre-admission testing.

## 2021-10-01 NOTE — Progress Notes (Addendum)
Post-op pain medication sent in pre-operatively with the fill date being the day before surgery. Her PCP who prescribed her pain medication previously is aware and would like our office to prescribe her post-op meds.

## 2021-10-01 NOTE — Patient Instructions (Addendum)
DUE TO COVID-19 ONLY ONE VISITOR IS ALLOWED TO COME WITH YOU AND STAY IN THE WAITING ROOM ONLY DURING PRE OP AND PROCEDURE DAY OF SURGERY IF YOU ARE GOING HOME AFTER SURGERY. IF YOU ARE SPENDING THE NIGHT 2 PEOPLE MAY VISIT WITH YOU IN YOUR PRIVATE ROOM AFTER SURGERY UNTIL VISITING  HOURS ARE OVER AT 800 PM AND 1  VISITOR  MAY  SPEND THE NIGHT.                 Ashley Neal     Your procedure is scheduled on: 10/08/21   Report to [facility] Main  Entrance   Report to short stay at 5:15 AM     Call this number if you have problems the morning of surgery [phone]   Follow diet and bowel prep instructions from the Dr's office    Have a light diet the day before surgery and switch to clear liquids after midnight until 4:30 AM the day of surgery.  Full Liquid Diet   Strained creamy soups Tea, Coffee- with cream or mild and sugar or honey  Juices- cranberry , grape and apple  Jello  Milkshakes  Pudding , custards  Popsicles  Water Plain ice cream f, frozen yogurt, sherbet, plain yogurt  Fruit ices and popsicles with no fruit pulp  Sugar, honey and syrups Clear broths  Boost, Ensure, Resource and other liquid supplements NO CARBONATED BEVERAGES       At 4:15 AM drink pre surgery drink.   Nothing by mouth after 4:30 AM.       BRUSH YOUR TEETH MORNING OF SURGERY AND RINSE YOUR MOUTH OUT, NO CHEWING GUM CANDY OR MINTS.     Take these medicines the morning of surgery with A SIP OF WATER: Sinemet                                You may not have any metal on your body including hair pins and              piercings  Do not wear jewelry, make-up, lotions, powders or perfumes, deodorant             Do not wear nail polish on your fingernails.  Do not shave  48 hours prior to surgery.                 Do not bring valuables to the hospital. [place].  Contacts, dentures or bridgework may not be worn into  surgery..     Patients discharged the day of surgery will not be allowed to drive home.  IF YOU ARE HAVING SURGERY AND GOING HOME THE SAME DAY, YOU MUST HAVE AN ADULT TO DRIVE YOU HOME AND BE WITH YOU FOR 24 HOURS. YOU MAY GO HOME BY TAXI OR UBER OR ORTHERWISE, BUT AN ADULT MUST ACCOMPANY YOU HOME AND STAY WITH YOU FOR 24 HOURS.  Name and phone number of your driver:  Special Instructions: N/A              Please read over the following fact sheets you were given: _____________________________________________________________________             [facility] - Preparing for Surgery Before surgery, you can play an important role.  Because skin is not sterile, your  skin needs to be as free of germs as possible.  You can reduce the number of germs on your skin by washing with CHG (chlorahexidine gluconate) soap before surgery.  CHG is an antiseptic cleaner which kills germs and bonds with the skin to continue killing germs even after washing. Please DO NOT use if you have an allergy to CHG or antibacterial soaps.  If your skin becomes reddened/irritated stop using the CHG and inform your nurse when you arrive at Short Stay. Do not shave (including legs and underarms) for at least 48 hours prior to the first CHG shower Please follow these instructions carefully:  1.  Shower with CHG Soap the night before surgery and the  morning of Surgery.  2.  If you choose to wash your hair, wash your hair first as usual with your  normal  shampoo.  3.  After you shampoo, rinse your hair and body thoroughly to remove the  shampoo.                            4.  Use CHG as you would any other liquid soap.  You can apply chg directly  to the skin and wash                       Gently with a scrungie or clean washcloth.  5.  Apply the CHG Soap to your body ONLY FROM THE NECK DOWN.   Do not use on face/ open                           Wound or open sores. Avoid contact with eyes, ears mouth and genitals (private  parts).                       Wash face,  Genitals (private parts) with your normal soap.             6.  Wash thoroughly, paying special attention to the area where your surgery  will be performed.  7.  Thoroughly rinse your body with warm water from the neck down.  8.  DO NOT shower/wash with your normal soap after using and rinsing off  the CHG Soap.                9.  Pat yourself dry with a clean towel.            10.  Wear clean pajamas.            11.  Place clean sheets on your bed the night of your first shower and do not  sleep with pets. Day of Surgery : Do not apply any lotions/deodorants the morning of surgery.  Please wear clean clothes to the hospital/surgery center.  FAILURE TO FOLLOW THESE INSTRUCTIONS MAY RESULT IN THE CANCELLATION OF YOUR SURGERY PATIENT SIGNATURE_________________________________  NURSE SIGNATURE__________________________________  ________________________________________________________________________

## 2021-10-02 ENCOUNTER — Other Ambulatory Visit: Payer: Self-pay

## 2021-10-02 ENCOUNTER — Encounter (HOSPITAL_COMMUNITY): Payer: Self-pay

## 2021-10-02 ENCOUNTER — Encounter (HOSPITAL_COMMUNITY)
Admission: RE | Admit: 2021-10-02 | Discharge: 2021-10-02 | Disposition: A | Discharge status: Home / Self Care | Payer: PPO | Source: Ambulatory Visit | Attending: Gynecologic Oncology | Admitting: Gynecologic Oncology

## 2021-10-02 DIAGNOSIS — Z01812 Encounter for preprocedural laboratory examination: Secondary | ICD-10-CM | POA: Insufficient documentation

## 2021-10-02 DIAGNOSIS — N8502 Endometrial intraepithelial neoplasia [EIN]: Secondary | ICD-10-CM | POA: Insufficient documentation

## 2021-10-02 LAB — CBC
HCT: 38.4 % (ref 36.0–46.0)
Hemoglobin: 13.3 g/dL (ref 12.0–15.0)
MCH: 31.7 pg (ref 26.0–34.0)
MCHC: 34.6 g/dL (ref 30.0–36.0)
MCV: 91.6 fL (ref 80.0–100.0)
Platelets: 219 10*3/uL (ref 150–400)
RBC: 4.19 MIL/uL (ref 3.87–5.11)
RDW: 13.1 % (ref 11.5–15.5)
WBC: 6.5 10*3/uL (ref 4.0–10.5)
nRBC: 0 % (ref 0.0–0.2)

## 2021-10-02 LAB — COMPREHENSIVE METABOLIC PANEL
ALT: <5 U/L (ref 0–44)
AST: 14 U/L — ABNORMAL LOW (ref 15–41)
Albumin: 4.3 g/dL (ref 3.5–5.0)
Alkaline Phosphatase: 47 U/L (ref 38–126)
Anion gap: 6 (ref 5–15)
BUN: 12 mg/dL (ref 8–23)
CO2: 28 mmol/L (ref 22–32)
Calcium: 9.5 mg/dL (ref 8.9–10.3)
Chloride: 109 mmol/L (ref 98–111)
Creatinine, Ser: 0.79 mg/dL (ref 0.44–1.00)
GFR, Estimated: >60 mL/min (ref >60)
Glucose, Bld: 112 mg/dL — ABNORMAL HIGH (ref 70–99)
Potassium: 4.1 mmol/L (ref 3.5–5.1)
Sodium: 143 mmol/L (ref 135–145)
Total Bilirubin: 0.8 mg/dL (ref 0.3–1.2)
Total Protein: 6.5 g/dL (ref 6.5–8.1)

## 2021-10-02 NOTE — Progress Notes (Signed)
COVID test-NA   Bowel prep reminder:pt will call Dr's office to talk about her bowel needs  PCP -Faustino Congress NP  Cardiologist - Dr. Lendell Caprice  Chest x-ray - no EKG - 01/18/21-epic Stress Test - no ECHO - no Cardiac Cath - no Pacemaker/ICD device last checked:NA  Sleep Study - no CPAP -   Fasting Blood Sugar - NA Checks Blood Sugar _____ times a day  Blood Thinner Instructions:ASA 81 mg/ Dr. Irish Lack Aspirin Instructions:none Last Dose:Pt stopped 09/18/21  Anesthesia review: no  Patient denies shortness of breath, fever, cough and chest pain at PAT appointment Pt has Parkinson's and is concerned about delayed mid day dose.  She reports no SOB with activities.  Patient verbalized understanding of instructions that were given to them at the PAT appointment. Patient was also instructed that they will need to review over the PAT instructions again at home before surgery. We went over the instructions a few times until she felt she had it clear in her mind.

## 2021-10-03 ENCOUNTER — Other Ambulatory Visit: Payer: Self-pay | Admitting: Gynecologic Oncology

## 2021-10-03 DIAGNOSIS — G8918 Other acute postprocedural pain: Secondary | ICD-10-CM

## 2021-10-03 DIAGNOSIS — N8502 Endometrial intraepithelial neoplasia [EIN]: Secondary | ICD-10-CM

## 2021-10-03 MED ORDER — OXYCODONE HCL 5 MG PO TABS: 5.0000 mg | ORAL_TABLET | Freq: Four times a day (QID) | ORAL | 0 refills | Status: DC | PRN

## 2021-10-03 NOTE — Telephone Encounter (Addendum)
Spoke with Ms.Ector this afternoon regarding her constipation. She stated she is trying to not take her pain medication as much to try to prevent the constipation. She also stated she's not eating as much.  She was able to have a BM yesterday with the help of an OTC laxative. Encouraged eating, staying hydrated, and activity to promote BMs.  Pt asked why she cannot shave 48 hours prior to surgery. She stated that she will feel very uncomfortable if she cannot shave her legs the day before surgery.  Explained the risk of infection and reinforced to not shave 48 hours before surgery but she can shave today.  Pt verbalized understanding.

## 2021-10-03 NOTE — Progress Notes (Signed)
Resending oxycodone with a new fill date since surgery has been moved to a sooner date. Pt's pharmacy will discontinue previous script.

## 2021-10-07 ENCOUNTER — Encounter (HOSPITAL_COMMUNITY): Payer: Self-pay | Admitting: Gynecologic Oncology

## 2021-10-07 ENCOUNTER — Telehealth: Payer: Self-pay

## 2021-10-07 NOTE — Telephone Encounter (Signed)
Telephone call to check on pre-operative status.  Patient compliant with pre-operative instructions.  Reinforced nothing to eat after midnight. Clear liquids until 0430. Patient to arrive at 0515.  No questions or concerns voiced.  Instructed to call for any needs.  ?

## 2021-10-08 ENCOUNTER — Encounter (HOSPITAL_COMMUNITY)
Admission: RE | Disposition: A | Discharge status: Home / Self Care | Payer: Self-pay | Source: Ambulatory Visit | Attending: Gynecologic Oncology

## 2021-10-08 ENCOUNTER — Ambulatory Visit (HOSPITAL_COMMUNITY): Payer: PPO | Admitting: Anesthesiology

## 2021-10-08 ENCOUNTER — Ambulatory Visit (HOSPITAL_BASED_OUTPATIENT_CLINIC_OR_DEPARTMENT_OTHER): Payer: PPO | Admitting: Anesthesiology

## 2021-10-08 ENCOUNTER — Encounter (HOSPITAL_COMMUNITY): Payer: Self-pay | Admitting: Gynecologic Oncology

## 2021-10-08 ENCOUNTER — Ambulatory Visit (HOSPITAL_COMMUNITY)
Admission: RE | Admit: 2021-10-08 | Discharge: 2021-10-08 | Disposition: A | Discharge status: Home / Self Care | Payer: PPO | Source: Ambulatory Visit | Attending: Gynecologic Oncology | Admitting: Gynecologic Oncology

## 2021-10-08 DIAGNOSIS — C801 Malignant (primary) neoplasm, unspecified: Secondary | ICD-10-CM

## 2021-10-08 DIAGNOSIS — M160 Bilateral primary osteoarthritis of hip: Secondary | ICD-10-CM | POA: Diagnosis not present

## 2021-10-08 DIAGNOSIS — N8501 Benign endometrial hyperplasia: Secondary | ICD-10-CM | POA: Diagnosis not present

## 2021-10-08 DIAGNOSIS — I10 Essential (primary) hypertension: Secondary | ICD-10-CM | POA: Insufficient documentation

## 2021-10-08 DIAGNOSIS — C541 Malignant neoplasm of endometrium: Secondary | ICD-10-CM | POA: Insufficient documentation

## 2021-10-08 DIAGNOSIS — G709 Myoneural disorder, unspecified: Secondary | ICD-10-CM | POA: Diagnosis not present

## 2021-10-08 DIAGNOSIS — K5090 Crohn's disease, unspecified, without complications: Secondary | ICD-10-CM | POA: Insufficient documentation

## 2021-10-08 DIAGNOSIS — Z8049 Family history of malignant neoplasm of other genital organs: Secondary | ICD-10-CM | POA: Insufficient documentation

## 2021-10-08 DIAGNOSIS — D270 Benign neoplasm of right ovary: Secondary | ICD-10-CM | POA: Diagnosis not present

## 2021-10-08 DIAGNOSIS — D259 Leiomyoma of uterus, unspecified: Secondary | ICD-10-CM | POA: Diagnosis not present

## 2021-10-08 DIAGNOSIS — R9389 Abnormal findings on diagnostic imaging of other specified body structures: Secondary | ICD-10-CM | POA: Diagnosis not present

## 2021-10-08 DIAGNOSIS — G20 Parkinson's disease: Secondary | ICD-10-CM | POA: Diagnosis not present

## 2021-10-08 DIAGNOSIS — N8502 Endometrial intraepithelial neoplasia [EIN]: Secondary | ICD-10-CM

## 2021-10-08 DIAGNOSIS — N8003 Adenomyosis of the uterus: Secondary | ICD-10-CM | POA: Diagnosis not present

## 2021-10-08 DIAGNOSIS — N72 Inflammatory disease of cervix uteri: Secondary | ICD-10-CM | POA: Diagnosis not present

## 2021-10-08 DIAGNOSIS — C7982 Secondary malignant neoplasm of genital organs: Secondary | ICD-10-CM | POA: Diagnosis not present

## 2021-10-08 DIAGNOSIS — Z79899 Other long term (current) drug therapy: Secondary | ICD-10-CM | POA: Diagnosis not present

## 2021-10-08 DIAGNOSIS — N83201 Unspecified ovarian cyst, right side: Secondary | ICD-10-CM | POA: Insufficient documentation

## 2021-10-08 DIAGNOSIS — N840 Polyp of corpus uteri: Secondary | ICD-10-CM | POA: Diagnosis not present

## 2021-10-08 DIAGNOSIS — N888 Other specified noninflammatory disorders of cervix uteri: Secondary | ICD-10-CM | POA: Diagnosis not present

## 2021-10-08 HISTORY — PX: ROBOTIC ASSISTED TOTAL HYSTERECTOMY WITH BILATERAL SALPINGO OOPHERECTOMY: SHX6086

## 2021-10-08 HISTORY — PX: CYSTOSCOPY: SHX5120

## 2021-10-08 HISTORY — DX: Malignant (primary) neoplasm, unspecified: C80.1

## 2021-10-08 LAB — ABO/RH: ABO/RH(D): O NEG

## 2021-10-08 LAB — TYPE AND SCREEN
ABO/RH(D): O NEG
Antibody Screen: NEGATIVE

## 2021-10-08 SURGERY — HYSTERECTOMY, TOTAL, ROBOT-ASSISTED, LAPAROSCOPIC, WITH BILATERAL SALPINGO-OOPHORECTOMY
Anesthesia: General

## 2021-10-08 MED ORDER — HYDROMORPHONE HCL 1 MG/ML IJ SOLN
0.2500 mg | INTRAMUSCULAR | Status: DC | PRN
  Administered 2021-10-08 (×2): 0.25 mg via INTRAVENOUS

## 2021-10-08 MED ORDER — LACTATED RINGERS IV SOLN: INTRAVENOUS | Status: DC | PRN

## 2021-10-08 MED ORDER — STERILE WATER FOR INJECTION IJ SOLN
INTRAMUSCULAR | Status: DC | PRN
  Administered 2021-10-08: 10 mL

## 2021-10-08 MED ORDER — ONDANSETRON HCL 4 MG/2ML IJ SOLN: 4.0000 mg | Freq: Once | INTRAMUSCULAR | Status: DC | PRN

## 2021-10-08 MED ORDER — ROCURONIUM BROMIDE 10 MG/ML (PF) SYRINGE
PREFILLED_SYRINGE | INTRAVENOUS | Status: DC | PRN
  Administered 2021-10-08: 5 mg via INTRAVENOUS
  Administered 2021-10-08: 70 mg via INTRAVENOUS
  Administered 2021-10-08: 10 mg via INTRAVENOUS

## 2021-10-08 MED ORDER — LACTATED RINGERS IR SOLN
Status: DC | PRN
Start: 2021-10-08 — End: 2021-10-08
  Administered 2021-10-08: 1000 mL

## 2021-10-08 MED ORDER — LACTATED RINGERS IV SOLN: INTRAVENOUS | Status: DC

## 2021-10-08 MED ORDER — STERILE WATER FOR IRRIGATION IR SOLN
Status: DC | PRN
  Administered 2021-10-08: 1000 mL

## 2021-10-08 MED ORDER — ONDANSETRON HCL 4 MG/2ML IJ SOLN
INTRAMUSCULAR | Status: AC
  Filled 2021-10-08: qty 4

## 2021-10-08 MED ORDER — DEXAMETHASONE SODIUM PHOSPHATE 10 MG/ML IJ SOLN
INTRAMUSCULAR | Status: AC
  Filled 2021-10-08: qty 2

## 2021-10-08 MED ORDER — ROCURONIUM BROMIDE 10 MG/ML (PF) SYRINGE
PREFILLED_SYRINGE | INTRAVENOUS | Status: AC
  Filled 2021-10-08: qty 10

## 2021-10-08 MED ORDER — ORAL CARE MOUTH RINSE: 15.0000 mL | Freq: Once | OROMUCOSAL | Status: AC

## 2021-10-08 MED ORDER — DEXAMETHASONE SODIUM PHOSPHATE 10 MG/ML IJ SOLN
INTRAMUSCULAR | Status: DC | PRN
  Administered 2021-10-08: 8 mg via INTRAVENOUS

## 2021-10-08 MED ORDER — SUGAMMADEX SODIUM 200 MG/2ML IV SOLN
INTRAVENOUS | Status: DC | PRN
  Administered 2021-10-08: 195 mg via INTRAVENOUS

## 2021-10-08 MED ORDER — PROPOFOL 10 MG/ML IV BOLUS
INTRAVENOUS | Status: AC
  Filled 2021-10-08: qty 20

## 2021-10-08 MED ORDER — ACETAMINOPHEN 500 MG PO TABS
1000.0000 mg | ORAL_TABLET | ORAL | Status: AC
  Administered 2021-10-08: 1000 mg via ORAL
  Filled 2021-10-08: qty 2

## 2021-10-08 MED ORDER — PHENYLEPHRINE HCL-NACL 20-0.9 MG/250ML-% IV SOLN
INTRAVENOUS | Status: AC
  Filled 2021-10-08: qty 500

## 2021-10-08 MED ORDER — CHLORHEXIDINE GLUCONATE 0.12 % MT SOLN
15.0000 mL | Freq: Once | OROMUCOSAL | Status: AC
  Administered 2021-10-08: 15 mL via OROMUCOSAL

## 2021-10-08 MED ORDER — ENSURE PRE-SURGERY PO LIQD
296.0000 mL | Freq: Once | ORAL | Status: DC
  Filled 2021-10-08: qty 296

## 2021-10-08 MED ORDER — STERILE WATER FOR INJECTION IJ SOLN
INTRAMUSCULAR | Status: DC | PRN
  Administered 2021-10-08: 4 mL via INTRAVENOUS

## 2021-10-08 MED ORDER — HYDROMORPHONE HCL 1 MG/ML IJ SOLN
INTRAMUSCULAR | Status: AC
  Administered 2021-10-08: 0.5 mg via INTRAVENOUS
  Filled 2021-10-08: qty 1

## 2021-10-08 MED ORDER — FENTANYL CITRATE (PF) 100 MCG/2ML IJ SOLN
INTRAMUSCULAR | Status: AC
  Filled 2021-10-08: qty 2

## 2021-10-08 MED ORDER — STERILE WATER FOR INJECTION IJ SOLN
INTRAMUSCULAR | Status: AC
  Filled 2021-10-08: qty 10

## 2021-10-08 MED ORDER — MIDAZOLAM HCL 5 MG/5ML IJ SOLN
INTRAMUSCULAR | Status: DC | PRN
  Administered 2021-10-08: 1 mg via INTRAVENOUS

## 2021-10-08 MED ORDER — BUPIVACAINE HCL 0.25 % IJ SOLN
INTRAMUSCULAR | Status: AC
  Filled 2021-10-08: qty 1

## 2021-10-08 MED ORDER — DROPERIDOL 2.5 MG/ML IJ SOLN
INTRAMUSCULAR | Status: DC | PRN
  Administered 2021-10-08: .625 mg via INTRAVENOUS

## 2021-10-08 MED ORDER — ROCURONIUM BROMIDE 10 MG/ML (PF) SYRINGE
PREFILLED_SYRINGE | INTRAVENOUS | Status: AC
  Filled 2021-10-08: qty 20

## 2021-10-08 MED ORDER — PROPOFOL 10 MG/ML IV BOLUS
INTRAVENOUS | Status: DC | PRN
  Administered 2021-10-08: 110 mg via INTRAVENOUS

## 2021-10-08 MED ORDER — EPHEDRINE 5 MG/ML INJ
INTRAVENOUS | Status: AC
  Filled 2021-10-08: qty 5

## 2021-10-08 MED ORDER — DEXAMETHASONE SODIUM PHOSPHATE 4 MG/ML IJ SOLN: 4.0000 mg | INTRAMUSCULAR | Status: DC

## 2021-10-08 MED ORDER — LIDOCAINE HCL (PF) 2 % IJ SOLN
INTRAMUSCULAR | Status: AC
  Filled 2021-10-08: qty 10

## 2021-10-08 MED ORDER — FENTANYL CITRATE (PF) 100 MCG/2ML IJ SOLN
INTRAMUSCULAR | Status: DC | PRN
  Administered 2021-10-08 (×3): 25 ug via INTRAVENOUS
  Administered 2021-10-08: 75 ug via INTRAVENOUS
  Administered 2021-10-08: 50 ug via INTRAVENOUS

## 2021-10-08 MED ORDER — BUPIVACAINE HCL 0.25 % IJ SOLN
INTRAMUSCULAR | Status: DC | PRN
Start: 2021-10-08 — End: 2021-10-08
  Administered 2021-10-08: 35 mL

## 2021-10-08 MED ORDER — ONDANSETRON HCL 4 MG/2ML IJ SOLN
INTRAMUSCULAR | Status: DC | PRN
  Administered 2021-10-08: 4 mg via INTRAVENOUS

## 2021-10-08 MED ORDER — EPHEDRINE SULFATE-NACL 50-0.9 MG/10ML-% IV SOSY
PREFILLED_SYRINGE | INTRAVENOUS | Status: DC | PRN
  Administered 2021-10-08: 5 mg via INTRAVENOUS

## 2021-10-08 MED ORDER — CEFAZOLIN SODIUM-DEXTROSE 2-4 GM/100ML-% IV SOLN
2.0000 g | INTRAVENOUS | Status: AC
  Administered 2021-10-08: 2 g via INTRAVENOUS
  Filled 2021-10-08: qty 100

## 2021-10-08 MED ORDER — MIDAZOLAM HCL 2 MG/2ML IJ SOLN
INTRAMUSCULAR | Status: AC
  Filled 2021-10-08: qty 2

## 2021-10-08 MED ORDER — LIDOCAINE 2% (20 MG/ML) 5 ML SYRINGE
INTRAMUSCULAR | Status: DC | PRN
  Administered 2021-10-08: 60 mg via INTRAVENOUS

## 2021-10-08 MED ORDER — HEPARIN SODIUM (PORCINE) 5000 UNIT/ML IJ SOLN
5000.0000 [IU] | INTRAMUSCULAR | Status: AC
  Administered 2021-10-08: 5000 [IU] via SUBCUTANEOUS
  Filled 2021-10-08: qty 1

## 2021-10-08 SURGICAL SUPPLY — 74 items
APPLICATOR SURGIFLO ENDO (HEMOSTASIS) IMPLANT
BACTOSHIELD CHG 4% 4OZ (MISCELLANEOUS) ×1
BAG COUNTER SPONGE SURGICOUNT (BAG) IMPLANT
BAG LAPAROSCOPIC 12 15 PORT 16 (BASKET) IMPLANT
BAG RETRIEVAL 12/15 (BASKET)
BAG SPEC RTRVL LRG 6X4 10 (ENDOMECHANICALS)
BAG SPNG CNTER NS LX DISP (BAG)
BLADE SURG SZ10 CARB STEEL (BLADE) IMPLANT
COVER BACK TABLE 60X90IN (DRAPES) ×2 IMPLANT
COVER TIP SHEARS 8 DVNC (MISCELLANEOUS) ×1 IMPLANT
COVER TIP SHEARS 8MM DA VINCI (MISCELLANEOUS) ×2
DERMABOND ADVANCED (GAUZE/BANDAGES/DRESSINGS) ×1
DERMABOND ADVANCED .7 DNX12 (GAUZE/BANDAGES/DRESSINGS) ×1 IMPLANT
DRAPE ARM DVNC X/XI (DISPOSABLE) ×4 IMPLANT
DRAPE COLUMN DVNC XI (DISPOSABLE) ×1 IMPLANT
DRAPE DA VINCI XI ARM (DISPOSABLE) ×8
DRAPE DA VINCI XI COLUMN (DISPOSABLE) ×2
DRAPE SHEET LG 3/4 BI-LAMINATE (DRAPES) ×2 IMPLANT
DRAPE SURG IRRIG POUCH 19X23 (DRAPES) ×2 IMPLANT
DRSG OPSITE POSTOP 4X6 (GAUZE/BANDAGES/DRESSINGS) IMPLANT
DRSG OPSITE POSTOP 4X8 (GAUZE/BANDAGES/DRESSINGS) IMPLANT
ELECT PENCIL ROCKER SW 15FT (MISCELLANEOUS) IMPLANT
ELECT REM PT RETURN 15FT ADLT (MISCELLANEOUS) ×2 IMPLANT
GAUZE 4X4 16PLY ~~LOC~~+RFID DBL (SPONGE) ×2 IMPLANT
GLOVE SURG ENC MOIS LTX SZ6 (GLOVE) ×9 IMPLANT
GLOVE SURG ENC MOIS LTX SZ6.5 (GLOVE) ×4 IMPLANT
GOWN SRG XL LVL 4 BRTHBL STRL (GOWNS) IMPLANT
GOWN STRL NON-REIN XL LVL4 (GOWNS) ×2
GOWN STRL REUS W/ TWL LRG LVL3 (GOWN DISPOSABLE) ×4 IMPLANT
GOWN STRL REUS W/TWL LRG LVL3 (GOWN DISPOSABLE) ×8
HOLDER FOLEY CATH W/STRAP (MISCELLANEOUS) IMPLANT
IRRIG SUCT STRYKERFLOW 2 WTIP (MISCELLANEOUS) ×2
IRRIGATION SUCT STRKRFLW 2 WTP (MISCELLANEOUS) ×1 IMPLANT
KIT PROCEDURE DA VINCI SI (MISCELLANEOUS) ×2
KIT PROCEDURE DVNC SI (MISCELLANEOUS) IMPLANT
KIT TURNOVER KIT A (KITS) IMPLANT
MANIPULATOR ADVINCU DEL 3.0 PL (MISCELLANEOUS) IMPLANT
MANIPULATOR ADVINCU DEL 3.5 PL (MISCELLANEOUS) IMPLANT
MANIPULATOR UTERINE 4.5 ZUMI (MISCELLANEOUS) ×1 IMPLANT
NDL HYPO 21X1.5 SAFETY (NEEDLE) ×1 IMPLANT
NDL SPNL 18GX3.5 QUINCKE PK (NEEDLE) IMPLANT
NEEDLE HYPO 21X1.5 SAFETY (NEEDLE) ×2 IMPLANT
NEEDLE SPNL 18GX3.5 QUINCKE PK (NEEDLE) ×2 IMPLANT
OBTURATOR OPTICAL STANDARD 8MM (TROCAR) ×2
OBTURATOR OPTICAL STND 8 DVNC (TROCAR) ×1
OBTURATOR OPTICALSTD 8 DVNC (TROCAR) ×1 IMPLANT
PACK ROBOT GYN CUSTOM WL (TRAY / TRAY PROCEDURE) ×2 IMPLANT
PAD POSITIONING PINK XL (MISCELLANEOUS) ×2 IMPLANT
PORT ACCESS TROCAR AIRSEAL 12 (TROCAR) ×1 IMPLANT
PORT ACCESS TROCAR AIRSEAL 5M (TROCAR) ×1
POUCH SPECIMEN RETRIEVAL 10MM (ENDOMECHANICALS) IMPLANT
SCRUB CHG 4% DYNA-HEX 4OZ (MISCELLANEOUS) ×1 IMPLANT
SEAL CANN UNIV 5-8 DVNC XI (MISCELLANEOUS) ×4 IMPLANT
SEAL XI 5MM-8MM UNIVERSAL (MISCELLANEOUS) ×8
SET IRRIG Y TYPE TUR BLADDER L (SET/KITS/TRAYS/PACK) ×1 IMPLANT
SET TRI-LUMEN FLTR TB AIRSEAL (TUBING) ×2 IMPLANT
SPIKE FLUID TRANSFER (MISCELLANEOUS) ×2 IMPLANT
SPONGE T-LAP 18X18 ~~LOC~~+RFID (SPONGE) IMPLANT
SURGIFLO W/THROMBIN 8M KIT (HEMOSTASIS) IMPLANT
SUT MNCRL AB 4-0 PS2 18 (SUTURE) IMPLANT
SUT PDS AB 1 TP1 96 (SUTURE) IMPLANT
SUT VIC AB 0 CT1 27 (SUTURE)
SUT VIC AB 0 CT1 27XBRD ANTBC (SUTURE) IMPLANT
SUT VIC AB 2-0 CT1 27 (SUTURE)
SUT VIC AB 2-0 CT1 TAPERPNT 27 (SUTURE) IMPLANT
SUT VIC AB 4-0 PS2 18 (SUTURE) ×4 IMPLANT
SYR 10ML LL (SYRINGE) ×1 IMPLANT
TOWEL OR NON WOVEN STRL DISP B (DISPOSABLE) IMPLANT
TRAP SPECIMEN MUCUS 40CC (MISCELLANEOUS) IMPLANT
TRAY FOLEY MTR SLVR 16FR STAT (SET/KITS/TRAYS/PACK) ×2 IMPLANT
TROCAR XCEL NON-BLD 5MMX100MML (ENDOMECHANICALS) IMPLANT
UNDERPAD 30X36 HEAVY ABSORB (UNDERPADS AND DIAPERS) ×4 IMPLANT
WATER STERILE IRR 1000ML POUR (IV SOLUTION) ×2 IMPLANT
YANKAUER SUCT BULB TIP 10FT TU (MISCELLANEOUS) IMPLANT

## 2021-10-08 NOTE — Transfer of Care (Signed)
Immediate Anesthesia Transfer of Care Note  Patient: Ashley Neal  Procedure(s) Performed: Procedure(s): XI ROBOTIC ASSISTED TOTAL HYSTERECTOMY WITH BILATERAL SALPINGO OOPHORECTOMY (N/A) CYSTOSCOPY (N/A)  Patient Location: PACU  Anesthesia Type:General  Level of Consciousness:  sedated, patient cooperative and responds to stimulation  Airway & Oxygen Therapy:Patient Spontanous Breathing and Patient connected to face mask oxgen  Post-op Assessment:  Report given to PACU RN and Post -op Vital signs reviewed and stable  Post vital signs:  Reviewed and stable  Last Vitals:  Vitals:   10/08/21 0540  BP: (!) 145/80  Pulse: 85  Resp: 16  Temp: 36.6 C  SpO2: 007%    Complications: No apparent anesthesia complications

## 2021-10-08 NOTE — Anesthesia Postprocedure Evaluation (Signed)
Anesthesia Post Note  Patient: Algie Coffer  Procedure(s) Performed: XI ROBOTIC ASSISTED TOTAL HYSTERECTOMY WITH BILATERAL SALPINGO OOPHORECTOMY;SENTINEL LYMPH NODE INJECTION CYSTOSCOPY     Patient location during evaluation: PACU Anesthesia Type: General Level of consciousness: awake and alert Pain management: pain level controlled Vital Signs Assessment: post-procedure vital signs reviewed and stable Respiratory status: spontaneous breathing, nonlabored ventilation, respiratory function stable and patient connected to nasal cannula oxygen Cardiovascular status: blood pressure returned to baseline and stable Postop Assessment: no apparent nausea or vomiting Anesthetic complications: no   No notable events documented.  Last Vitals:  Vitals:   10/08/21 1100 10/08/21 1115  BP: 134/77 138/61  Pulse:  70  Resp:  16  Temp:  36.6 C  SpO2:  95%    Last Pain:  Vitals:   10/08/21 1115  TempSrc:   PainSc: 3                  Desi Carby S

## 2021-10-08 NOTE — Discharge Instructions (Signed)
°  Medications:  °- Take ibuprofen and tylenol first line for pain control. Take these regularly (every 6 hours) to decrease the build up of pain. ° °- If necessary, for severe pain not relieved by ibuprofen, take tramadol. ° °- While taking tramadol you should take sennakot every night to reduce the likelihood of constipation. If this causes diarrhea, stop its use. ° °Diet: °1. Low sodium Heart Healthy Diet is recommended. ° °2. It is safe to use a laxative if you have difficulty moving your bowels.  ° °Wound Care: °1. Keep clean and dry.  Shower daily. ° °Reasons to call the Doctor: ° °Fever - Oral temperature greater than 100.4 degrees Fahrenheit °Foul-smelling vaginal discharge °Difficulty urinating °Nausea and vomiting °Increased pain at the site of the incision that is unrelieved with pain medicine. °Difficulty breathing with or without chest pain °New calf pain especially if only on one side °Sudden, continuing increased vaginal bleeding with or without clots. °  °Follow-up: °1. See Ashley Neal in 3 weeks. You will have a phone visit once pathology is back. ° °Contacts: °For questions or concerns you should contact: ° °Dr. Katherine Neal at [phone] °After hours and on week-ends call [phone] and ask to speak to the physician on call for Gynecologic Oncology  °

## 2021-10-08 NOTE — Op Note (Signed)
OPERATIVE NOTE  Pre-operative Diagnosis: CAH  Post-operative Diagnosis: same, no evidence of malignancy on frozen and no clear residual CAH  Operation: Robotic-assisted laparoscopic total hysterectomy with bilateral salpingoophorectomy, SLN injection with no mapping bilaterally, cystoscopy  Surgeon: Jeral Pinch MD  Assistant Surgeon: Lahoma Crocker MD (an MD assistant was necessary for tissue manipulation, management of robotic instrumentation, retraction and positioning due to the complexity of the case and hospital policies).   Anesthesia: GET  Urine Output: 150cc  Operative Findings:  On EUA, small mobile uterus. Normal upper abdominal survey. Normal appearing omentum and small and large bowel. Normal appendix. Uterus 8cm and normal in appearance with small fundal fibroid. Normal bilateral adnexa with evidence of prior BTL. ICG seen along posterior cervix, mapping not appreciated in either pelvic LN basin. No obvious adenopathy. Given some difficulty with the uterine manipulator and trouble initially identifying plane between bladder and LUS and cervix, cystoscopy was performed. Bladder dome intact, good efflux noted from bilateral ureteral orifices.   Estimated Blood Loss:  150cc      Total IV Fluids: see I&O flowsheet         Specimens: uterus, cervix, bilateral tubes and ovaries         Complications:  None apparent; patient tolerated the procedure well.         Disposition: PACU - hemodynamically stable.  Procedure Details  The patient was seen in the Holding Room. The risks, benefits, complications, treatment options, and expected outcomes were discussed with the patient.  The patient concurred with the proposed plan, giving informed consent.  The site of surgery properly noted/marked. The patient was identified as Ashley Neal and the procedure verified as a Robotic-assisted hysterectomy with bilateral salpingo oophorectomy with SLN biopsy.   After induction of  anesthesia, the patient was draped and prepped in the usual sterile manner. Patient was placed in supine position after anesthesia and draped and prepped in the usual sterile manner as follows: Her arms were tucked to her side with all appropriate precautions.  The shoulders were stabilized with padded shoulder blocks applied to the acromium processes.  The patient was placed in the semi-lithotomy position in [place].  The perineum and vagina were prepped with CholoraPrep. The patient was draped after the CholoraPrep had been allowed to dry for 3 minutes.  A Time Out was held and the above information confirmed.  The urethra was prepped with Betadine. Foley catheter was placed.  A sterile speculum was placed in the vagina.  The cervix was grasped with a single-tooth tenaculum. 2mg  total of ICG was injected into the cervical stroma at 2 and 9 o'clock with 1cc injected at a 1cm and 20mm depth (concentration 0.5mg /ml) in all locations. The cervix was dilated with Kennon Rounds dilators.  The ZUMI uterine manipulator with a medium colpotomizer ring was placed without difficulty.  A pneum occluder balloon was placed over the manipulator.  OG tube placement was confirmed and to suction.   Next, a 10 mm skin incision was made 1 cm below the subcostal margin in the midclavicular line.  The 5 mm Optiview port and scope was used for direct entry.  Opening pressure was under 10 mm CO2.  The abdomen was insufflated and the findings were noted as above.   At this point and all points during the procedure, the patient's intra-abdominal pressure did not exceed 15 mmHg. Next, an 8 mm skin incision was made superior to the umbilicus and a right and left port were placed about  8 cm lateral to the robot port on the right and left side.  A fourth arm was placed on the right.  The 5 mm assist trocar was exchanged for a 10-12 mm port. All ports were placed under direct visualization.  The patient was placed in steep Trendelenburg.   Bowel was folded away into the upper abdomen.  The robot was docked in the normal manner.  The right and left peritoneum were opened parallel to the IP ligament to open the retroperitoneal spaces bilaterally. The round ligaments were transected. The SLN mapping was performed in bilateral pelvic basins. After identifying the ureters, the para rectal and paravesical spaces were opened up entirely with careful dissection below the level of the ureters bilaterally and to the depth of the uterine artery origin in order to skeletonize the uterine "web" and ensure visualization of all parametrial channels. The para-aortic basins were carefully exposed and evaluated for isolated para-aortic SLN's. Lymphatic channels were not identified in either pelvic basin or in the para-aortic areas.   The hysterectomy was started.  The ureter was again noted to be on the medial leaf of the broad ligament.  The peritoneum above the ureter was incised and stretched and the infundibulopelvic ligament was skeletonized, cauterized and cut.  The posterior peritoneum was taken down to the level of the KOH ring.  The anterior peritoneum was also taken down.  The bladder flap was created to the level of the KOH ring.  Given the positioning of the ring, it was initially quite difficult to find the plan between the bladder and the cervix. The uterine artery on the right side was skeletonized, cauterized and cut in the normal manner.  A similar procedure was performed on the left.  The colpotomy was made and the uterus, cervix, bilateral ovaries and tubes were amputated and delivered through the vagina.  Pedicles were inspected and excellent hemostasis was achieved.    The colpotomy at the vaginal cuff was closed with Vicryl on a CT1 needle in running manner.  Irrigation was used and excellent hemostasis was achieved.    Attention was then turned below and after the bladder was instilled with 200cc of sterile fluid after the foley catheter  was removed, cystoscopy was performed with findings noted above.   Frozen section then returned showing no malignancy, no residual hyperplasia noted.  At this point in the procedure was completed.  Robotic instruments were removed under direct visulaization.  The robot was undocked. The fascia at the 10-12 mm port was closed with 0 Vicryl on a UR-5 needle.  The subcuticular tissue was closed with 4-0 Vicryl and the skin was closed with 4-0 Monocryl in a subcuticular manner.  Dermabond was applied.    The vagina was swabbed with minimal bleeding noted.   All sponge, lap and needle counts were correct x  3.   The patient was transferred to the recovery room in stable condition.  Jeral Pinch, MD

## 2021-10-08 NOTE — Interval H&P Note (Signed)
History and Physical Interval Note:  10/08/2021 7:02 AM  Ashley Neal  has presented today for surgery, with the diagnosis of COMPLEX ATYPICAL ENDOMETRIAL HYPERPLASIA.  The various methods of treatment have been discussed with the patient and family. After consideration of risks, benefits and other options for treatment, the patient has consented to  Procedure(s): XI ROBOTIC ASSISTED TOTAL HYSTERECTOMY WITH BILATERAL SALPINGO OOPHORECTOMY, POSSIBLE LAPAROTOMY (N/A) SENTINEL NODE BIOPSY (N/A) POSSIBLE LYMPH NODE DISSECTION (N/A) as a surgical intervention.  The patient's history has been reviewed, patient examined, no change in status, stable for surgery.  I have reviewed the patient's chart and labs.  Questions were answered to the patient's satisfaction.     Lafonda Mosses

## 2021-10-08 NOTE — Anesthesia Procedure Notes (Signed)
Procedure Name: Intubation Date/Time: 10/08/2021 7:40 AM Performed by: Lavina Hamman, CRNA Pre-anesthesia Checklist: Patient identified, Emergency Drugs available, Suction available, Patient being monitored and Timeout performed Patient Re-evaluated:Patient Re-evaluated prior to induction Oxygen Delivery Method: Circle system utilized Preoxygenation: Pre-oxygenation with 100% oxygen Induction Type: IV induction Ventilation: Mask ventilation without difficulty Laryngoscope Size: Mac and 3 Grade View: Grade I Tube type: Oral Tube size: 7.0 mm Number of attempts: 1 Airway Equipment and Method: Stylet Placement Confirmation: ETT inserted through vocal cords under direct vision, positive ETCO2, CO2 detector and breath sounds checked- equal and bilateral Secured at: 21 cm Tube secured with: Tape Dental Injury: Teeth and Oropharynx as per pre-operative assessment  Comments: ATOI

## 2021-10-08 NOTE — Anesthesia Preprocedure Evaluation (Signed)
Anesthesia Evaluation  Patient identified by MRN, date of birth, ID band Patient awake    Reviewed: Allergy & Precautions, NPO status , Patient's Chart, lab work & pertinent test results  Airway Mallampati: II  TM Distance: >3 FB Neck ROM: Limited    Dental  (+) Upper Dentures, Lower Dentures   Pulmonary neg pulmonary ROS,    Pulmonary exam normal breath sounds clear to auscultation       Cardiovascular hypertension, Normal cardiovascular exam Rhythm:Regular Rate:Normal     Neuro/Psych parkinsons disease  Neuromuscular disease negative psych ROS   GI/Hepatic Neg liver ROS, GERD  ,  Endo/Other  negative endocrine ROS  Renal/GU negative Renal ROS  negative genitourinary   Musculoskeletal negative musculoskeletal ROS (+)   Abdominal   Peds negative pediatric ROS (+)  Hematology negative hematology ROS (+)   Anesthesia Other Findings   Reproductive/Obstetrics negative OB ROS                             Anesthesia Physical Anesthesia Plan  ASA: 3  Anesthesia Plan: General   Post-op Pain Management: Dilaudid IV   Induction: Intravenous  PONV Risk Score and Plan: 3 and Ondansetron, Dexamethasone, Treatment may vary due to age or medical condition and Droperidol  Airway Management Planned: Oral ETT  Additional Equipment:   Intra-op Plan:   Post-operative Plan: Extubation in OR  Informed Consent: I have reviewed the patients History and Physical, chart, labs and discussed the procedure including the risks, benefits and alternatives for the proposed anesthesia with the patient or authorized representative who has indicated his/her understanding and acceptance.     Dental advisory given  Plan Discussed with: CRNA and Surgeon  Anesthesia Plan Comments:         Anesthesia Quick Evaluation

## 2021-10-09 ENCOUNTER — Encounter (HOSPITAL_COMMUNITY): Payer: Self-pay | Admitting: Gynecologic Oncology

## 2021-10-09 ENCOUNTER — Telehealth: Payer: Self-pay

## 2021-10-09 ENCOUNTER — Telehealth: Payer: Self-pay | Admitting: Gynecologic Oncology

## 2021-10-09 ENCOUNTER — Other Ambulatory Visit: Payer: Self-pay

## 2021-10-09 DIAGNOSIS — C541 Malignant neoplasm of endometrium: Secondary | ICD-10-CM | POA: Diagnosis not present

## 2021-10-09 NOTE — Telephone Encounter (Signed)
Spoke with Ms. Service this morning. She states she is eating, drinking and urinating well. She has not had a BM yet but is passing gas. She is taking her miralax, once last night and once this morning. Encouraged her to drink plenty of water. She denies fever or chills. Incisions are dry and intact. She reports some light vaginal spotting which is expected. Advised to call if bleeding increases like a period or she notices any foul odor. She rates her pain 3-4/10. Her pain is controlled with oxycodone. Advised patient she can also take advil to help with the discomfort and inflammation after surgery (600 mg every 6 hours). ? ?Patient reports shoulder pain, advised that this could be from the gas used in her abdomen from during the surgery. Instructed patient to apply heating pad and move throughout the day. Patient verbalized understanding.  ? ?Instructed to call office with any fever, chills, purulent drainage, uncontrolled pain or any other questions or concerns. Patient verbalizes understanding.  ? ?Pt aware of post op appointments as well as the office number [phone] and after hours number [phone] to call if she has any questions or concerns  ?

## 2021-10-09 NOTE — Telephone Encounter (Signed)
I called the patient to review recent pathology.  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  She did not map at the time of surgery and given benign results of frozen section, I did not pursue lymph node dissection.  Reviewed with the patient that I would like to move forward with getting a CT scan in a couple weeks after she has recovered.  While patients with stage I mucinous endometrial cancer overall doing very well, there is an increased risk for lymph node metastases.  I have recommended that we consider lymphadenectomy in about 4 weeks.  ? ?All questions answered. ? ?Patient notes that she is doing very well.  Used oxycodone a couple of times yesterday.  Did not take any narcotics today, only using Aleve.  Back pain has almost resolved. ? ?Jeral Pinch MD ?Gynecologic Oncology ? ?

## 2021-10-10 ENCOUNTER — Other Ambulatory Visit: Payer: Self-pay | Admitting: Gynecologic Oncology

## 2021-10-10 ENCOUNTER — Telehealth: Payer: Self-pay | Admitting: *Deleted

## 2021-10-10 DIAGNOSIS — C541 Malignant neoplasm of endometrium: Secondary | ICD-10-CM

## 2021-10-10 LAB — SURGICAL PATHOLOGY

## 2021-10-10 NOTE — Telephone Encounter (Signed)
I called the patient back, reviewed all of her questions that come up after our discussion yesterday.  Plan is for phone visit next week in case she has additional questions.  She sees me for postoperative follow-up on the 20th and has a CT scheduled on the 21st.  We will work to get a tentative date for surgery towards the end of the month for robotic lymph node dissection and any other indicated procedures.

## 2021-10-10 NOTE — Telephone Encounter (Signed)
Pt called office stating that she had several questions and clarifications regarding her phone call with Dr.Tucker yesterday.  Pt wanted clarification that the cancer did not spread out from her uterus to the colon. She also wanted to clarify that the lymph nodes are not affected in the groin area and does she need to see a medical oncologist if this problem is not GYN related. Lastly, she stated that Dr. Berline Lopes said in 2 weeks time she would be going back into the same incision site to do something else and pt wanted clarification on procedure and if waiting 2 weeks was too long or does she need to move the date up on that procedure. Informed patient that Dr. Berline Lopes would be notified of her questions and we will reach back out to pt.  ?

## 2021-10-15 ENCOUNTER — Ambulatory Visit (HOSPITAL_COMMUNITY): Payer: PPO

## 2021-10-15 ENCOUNTER — Inpatient Hospital Stay: Payer: PPO | Attending: Gynecologic Oncology | Admitting: Gynecologic Oncology

## 2021-10-15 ENCOUNTER — Other Ambulatory Visit (HOSPITAL_COMMUNITY): Payer: Self-pay

## 2021-10-15 DIAGNOSIS — Z90722 Acquired absence of ovaries, bilateral: Secondary | ICD-10-CM | POA: Diagnosis not present

## 2021-10-15 DIAGNOSIS — N8502 Endometrial intraepithelial neoplasia [EIN]: Secondary | ICD-10-CM

## 2021-10-15 DIAGNOSIS — C541 Malignant neoplasm of endometrium: Secondary | ICD-10-CM

## 2021-10-15 DIAGNOSIS — Z90710 Acquired absence of both cervix and uterus: Secondary | ICD-10-CM | POA: Diagnosis not present

## 2021-10-15 MED FILL — Carbidopa & Levodopa Tab 25-100 MG: ORAL | 90 days supply | Qty: 360 | Fill #0 | Status: CN

## 2021-10-15 NOTE — Progress Notes (Signed)
Gynecologic Oncology Telehealth Consult Note: Gyn-Onc  I connected with Ashley Neal on 10/16/21 at  4:20 PM EST by telephone and verified that I am speaking with the correct person using two identifiers.  I discussed the limitations, risks, security and privacy concerns of performing an evaluation and management service by telemedicine and the availability of in-person appointments. I also discussed with the patient that there may be a patient responsible charge related to this service. The patient expressed understanding and agreed to proceed.  Other persons participating in the visit and their role in the encounter: None.  Patient's location: Home Provider's location: [facility]  Reason for Visit: Follow-up after recent surgery  Treatment History: Oncology History  Endometrial cancer ([place])  08/31/2021 Imaging   MRI of the pelvis performed for low back pain on 08/31/2021 with incidental finding of endometrial thickening up to 1 cm, suboptimally evaluated.  There is no pelvic free fluid.  No adenopathy.  2.5 cm right ovarian cyst noted.  Other MRI findings: no hip fracture, dislocation or avascular necrosis.  There is mild osteoarthritis of bilateral hips.  There is a large high-grade partial-thickness tear of the left hamstring origin.  Partial-thickness tear of the left gluteus medius tendon insertion.  Mild osteoarthritis of bilateral SI joints also noted.   09/04/2021 Imaging   Pelvic ultrasound exam on 09/04/2021 shows a uterus measuring 6.4 x 3.1 x 4.2 cm with an endometrial lining of 1.1 cm with fluid.  Irregular appearance of the right ovary with simple follicle measuring up to 5 mm, avascular.  Simple cyst adjacent to the right ovary measures up to 2.7 cm and also avascular.  Left ovary noted to be normal.   09/12/2021 Initial Biopsy   EMB: showed rare atypical glands, predominantly mucus with few admixed benign endocervical glands.  Although the rare glandular fragments with  cribriform pattern and mild cytologic atypia are extremely scant, they are concerning for at least endometrial hyperplasia.   10/08/2021 Surgery   TRH/BSO, SLN injection with no mapping bilaterally, cystoscopy No residual hyperplasia seen on frozen, no malignancy   Findings:  On EUA, small mobile uterus. Normal upper abdominal survey. Normal appearing omentum and small and large bowel. Normal appendix. Uterus 8cm and normal in appearance with small fundal fibroid. Normal bilateral adnexa with evidence of prior BTL. ICG seen along posterior cervix, mapping not appreciated in either pelvic LN basin. No obvious adenopathy. Given some difficulty with the uterine manipulator and trouble initially identifying plane between bladder and LUS and cervix, cystoscopy was performed. Bladder dome intact, good efflux noted from bilateral ureteral orifices.    10/08/2021 Pathology Results   A. UTERUS, CERVIX, BILATERAL FALLOPIAN TUBES AND OVARIES:  Invasive well differentiated mucinous endometrial adenocarcinoma, FIGO 1  Tumor invades greater than 50% of the myometrium (11 mm /18 mm)  Tumor arises within complex atypical mucinous hyperplasia involving a  polyp and the anterior and posterior endometrium  Adenomyosis  Background inactive endometrium with cystic change  Benign serous cyst papillary adenofibroma of right ovary  Chronic cervicitis with nabothian cysts  Benign left ovary and right and left fallopian tubes   ONCOLOGY TABLE:   UTERUS, CARCINOMA OR CARCINOSARCOMA: Resection   Procedure: Total hysterectomy and bilateral salpingo-oophorectomy  Histologic Type: Mucinous endometrial adenocarcinoma  Histologic Grade: Well differentiated, FIGO 1  Myometrial Invasion: Present       Depth of Myometrial Invasion (mm): 11 mm       Myometrial Thickness (mm): 18 mm  Percentage of Myometrial Invasion: 61%  Uterine Serosa Involvement: Not identified  Cervical stromal Involvement:[Not identified  Extent  of involvement of other tissue/organs: Not identified  Peritoneal/Ascitic Fluid: Not submitted  Lymphovascular Invasion: Not identified  Regional Lymph Nodes: Not applicable (no lymph nodes submitted or found)   Distant Metastasis: Not applicable  Pathologic Stage Classification (pTNM, AJCC 8th Edition): pT1b, pN n/a  Ancillary Studies: MMR / MSI testing will be ordered  Representative Tumor Block: A5, A6  Comment(s): None    10/16/2021 Initial Diagnosis   Endometrial cancer ([place])     Interval History: Patient reports recovery continues to go well.  Still feels significantly tired and weak.  She is unfortunately having increase in her Parkinson symptoms including dyskinesia.  Reports baseline bowel and bladder function.  Denies any vaginal bleeding or discharge.  Past Medical/Surgical History: Past Medical History:  Diagnosis Date   GERD (gastroesophageal reflux disease)    Hypertension    Osteoarthritis    Osteopenia    Parkinson disease ([place])     Past Surgical History:  Procedure Laterality Date   CARPAL TUNNEL RELEASE Bilateral 1996/1997   CYSTOSCOPY N/A 10/08/2021   Procedure: CYSTOSCOPY;  Surgeon: Lafonda Mosses, MD;  Location: [facility];  Service: Gynecology;  Laterality: N/A;   ROBOTIC ASSISTED TOTAL HYSTERECTOMY WITH BILATERAL SALPINGO OOPHERECTOMY N/A 10/08/2021   Procedure: XI ROBOTIC ASSISTED TOTAL HYSTERECTOMY WITH BILATERAL SALPINGO OOPHORECTOMY;SENTINEL LYMPH NODE INJECTION;  Surgeon: Lafonda Mosses, MD;  Location: [facility];  Service: Gynecology;  Laterality: N/A;   SQUAMOUS CELL CARCINOMA EXCISION Left 2017   Left jawline   TUBAL LIGATION  20's    Family History  Problem Relation Age of Onset   Endometrial cancer Mother    Diabetes Mother    Alzheimer's disease Father    Coronary artery disease Father    Diabetes Sister    COPD Sister    Diabetes Sister    Heart disease Brother    Other Brother        MVA    Parkinson's disease Brother    Heart  disease Brother    Alcohol abuse Brother    Heart disease Brother    Parkinson's disease Maternal Aunt    Breast cancer Maternal Aunt    Colon cancer Paternal Aunt    Lung cancer Paternal Uncle    Healthy Son    Healthy Son    Prostate cancer Neg Hx    Pancreatic cancer Neg Hx    Ovarian cancer Neg Hx     Social History   Socioeconomic History   Marital status: Divorced    Spouse name: Not on file   Number of children: Not on file   Years of education: Not on file   Highest education level: Not on file  Occupational History   Occupation: retired    Comment: Chiropractor  Tobacco Use   Smoking status: Never   Smokeless tobacco: Never  Vaping Use   Vaping Use: Never used  Substance and Sexual Activity   Alcohol use: Yes    Comment: rare   Drug use: Never   Sexual activity: Yes  Other Topics Concern   Not on file  Social History Narrative   Not on file   Social Determinants of Health   Financial Resource Strain: Not on file  Food Insecurity: Not on file  Transportation Needs: Not on file  Physical Activity: Not on file  Stress: Not on file  Social Connections: Not on file  Current Medications:  Current Outpatient Medications:    ALPRAZolam (XANAX) 0.25 MG tablet, Take 0.25 mg by mouth 2 (two) times daily as needed for anxiety., Disp: , Rfl:    aspirin EC 81 MG tablet, Take 81 mg by mouth daily., Disp: , Rfl:    carbidopa-levodopa (SINEMET IR) 25-100 MG tablet, TAKE 1 TABLET BY MOUTH FOUR TIMES A DAY, Disp: 360 tablet, Rfl: 3   lisinopril (ZESTRIL) 10 MG tablet, Take 1 tablet (10 mg total) by mouth daily. May take extra 5 mg (half tablet) daily if BP over 160, Disp: 140 tablet, Rfl: 3   MAGNESIUM PO, Take 200 mg by mouth 3 (three) times a week., Disp: , Rfl:    Multiple Vitamin (MULTIVITAMIN) tablet, Take 1 tablet by mouth daily., Disp: , Rfl:    oxyCODONE (OXY IR/ROXICODONE) 5 MG immediate release tablet, Take 1 tablet (5 mg total) by mouth every 6 (six)  hours as needed for severe pain. For AFTER surgery only, do not take and drive, Disp: 15 tablet, Rfl: 0   polyethylene glycol powder (GLYCOLAX/MIRALAX) 17 GM/SCOOP powder, Take 17 g by mouth every other day., Disp: , Rfl:   Review of Symptoms: Pertinent positives as per HPI.  Physical Exam: There were no vitals taken for this visit. Deferred given limitations of phone visit.  Laboratory & Radiologic Studies: None new  Assessment & Plan: Ashley Neal is a 76 y.o. woman with at least stage IB mucinous endometrial adenocarcinoma, FIGO grade 1, who presents for telephone follow-up, treatment discussion.  Patient is overall doing well and meeting postoperative milestones.  She is no longer requiring narcotic medications.  We discussed that fatigue can be quite normal for multiple weeks after surgery.  Reviewed continued expectations and postoperative restrictions.  The patient and I had already spoken about her pathology.  Given increased risk of lymph node metastases with mucinous endometrial adenocarcinoma, I have recommended that we proceed with a CT scan, which is scheduled for the day after her visit with me later this month, and tentative plan for surgery again at the end of the month.  Plan for surgery will be for lymphadenectomy.  We have discussed that if lymph node evaluation is negative for metastatic disease, recommendation would be for adjuvant vaginal brachytherapy to decrease the risk of local recurrence given high-intermediate risk features on her uterine pathology.  I discussed the assessment and treatment plan with the patient. The patient was provided with an opportunity to ask questions and all were answered. The patient agreed with the plan and demonstrated an understanding of the instructions.   The patient was advised to call back or see an in-person evaluation if the symptoms worsen or if the condition fails to improve as anticipated.   18 minutes of total time was  spent for this patient encounter, including preparation, phone counseling with the patient and coordination of care, and documentation of the encounter.   Jeral Pinch, MD  Division of Gynecologic Oncology  Department of Obstetrics and Gynecology  [facility]

## 2021-10-16 ENCOUNTER — Encounter: Payer: Self-pay | Admitting: Gynecologic Oncology

## 2021-10-16 DIAGNOSIS — C541 Malignant neoplasm of endometrium: Secondary | ICD-10-CM | POA: Insufficient documentation

## 2021-10-17 ENCOUNTER — Telehealth: Payer: Self-pay

## 2021-10-17 ENCOUNTER — Ambulatory Visit: Payer: PPO | Admitting: Neurology

## 2021-10-17 ENCOUNTER — Encounter: Payer: Self-pay | Admitting: Gynecologic Oncology

## 2021-10-17 NOTE — Telephone Encounter (Signed)
Spoke with Ms. Ashley Neal this afternoon regarding surgery scheduling. Advised patient that her surgery has been scheduled with Dr. Berline Lopes on March 29th at [facility]. We will discuss the procedure in detail when she comes in for her post-op appointment with Dr. Berline Lopes. Patient verbalized understanding and states she has her pre-admission appointment arranged for 10/31/21.  ?Patient inquiring if her surgery will be changed with her upcoming CT scan. Advised that Dr. Berline Lopes will review the results of the scan and follow up with the patient. Patient verbalized understanding.  ?

## 2021-10-21 ENCOUNTER — Other Ambulatory Visit: Payer: Self-pay | Admitting: Oncology

## 2021-10-21 DIAGNOSIS — C541 Malignant neoplasm of endometrium: Secondary | ICD-10-CM | POA: Diagnosis not present

## 2021-10-21 DIAGNOSIS — F418 Other specified anxiety disorders: Secondary | ICD-10-CM | POA: Diagnosis not present

## 2021-10-21 DIAGNOSIS — B370 Candidal stomatitis: Secondary | ICD-10-CM | POA: Diagnosis not present

## 2021-10-21 DIAGNOSIS — S5012XA Contusion of left forearm, initial encounter: Secondary | ICD-10-CM | POA: Diagnosis not present

## 2021-10-21 DIAGNOSIS — G20 Parkinson's disease: Secondary | ICD-10-CM | POA: Diagnosis not present

## 2021-10-21 NOTE — Progress Notes (Signed)
Gynecologic Oncology Multi-Disciplinary Disposition Conference Note ? ?Date of the Conference: 10/21/2021 ? ?Patient Name: Ashley Neal  ?Referring Provider: Burman Riis, NP ?Primary GYN Oncologist: Dr. Berline Lopes ?Radiation Oncologist: Dr. Sondra Come ? ?Stage/Disposition:  At least stage IB, grade 1 mucinous endometrial adenocarcinoma. Disposition is for a CT scan followed by lymphadenectomy.  If lymph nodes are negative then vaginal brachytherapy is recommended.  ? ?This Multidisciplinary conference took place involving physicians from [facility], Medical Oncology, Radiation Oncology, Pathology, Radiology along with the Gynecologic Oncology Nurse Practitioner and Gynecologic Oncology Nurse Navigator.  Comprehensive assessment of the patient's malignancy, staging, need for surgery, chemotherapy, radiation therapy, and need for further testing were reviewed. Supportive measures, both inpatient and following discharge were also discussed. The recommended plan of care is documented. Greater than 35 minutes were spent correlating and coordinating this patient's care.  ?

## 2021-10-22 ENCOUNTER — Encounter: Payer: Self-pay | Admitting: Gynecologic Oncology

## 2021-10-22 DIAGNOSIS — F4323 Adjustment disorder with mixed anxiety and depressed mood: Secondary | ICD-10-CM | POA: Diagnosis not present

## 2021-10-23 ENCOUNTER — Inpatient Hospital Stay: Payer: PPO | Admitting: Gynecologic Oncology

## 2021-10-28 ENCOUNTER — Encounter: Payer: Self-pay | Admitting: Gynecologic Oncology

## 2021-10-28 ENCOUNTER — Other Ambulatory Visit: Payer: Self-pay

## 2021-10-28 ENCOUNTER — Inpatient Hospital Stay (HOSPITAL_BASED_OUTPATIENT_CLINIC_OR_DEPARTMENT_OTHER): Payer: PPO | Admitting: Gynecologic Oncology

## 2021-10-28 VITALS — BP 121/62 | HR 81 | Temp 97.5°F | Resp 16 | Ht 60.83 in | Wt 135.2 lb

## 2021-10-28 DIAGNOSIS — Z7189 Other specified counseling: Secondary | ICD-10-CM

## 2021-10-28 DIAGNOSIS — C541 Malignant neoplasm of endometrium: Secondary | ICD-10-CM

## 2021-10-28 DIAGNOSIS — Z90710 Acquired absence of both cervix and uterus: Secondary | ICD-10-CM

## 2021-10-28 DIAGNOSIS — M533 Sacrococcygeal disorders, not elsewhere classified: Secondary | ICD-10-CM

## 2021-10-28 DIAGNOSIS — Z90722 Acquired absence of ovaries, bilateral: Secondary | ICD-10-CM

## 2021-10-28 NOTE — H&P (View-Only) (Signed)
Gynecologic Oncology Return Clinic Visit ? ?10/28/2021 ? ?Reason for Visit: Follow-up after recent surgery, treatment discussion, surgery planning ? ?Treatment History: ?Oncology History Overview Note  ?MMR IHC intact ?MS stable ?  ?Endometrial cancer ([place])  ?08/31/2021 Imaging  ? MRI of the pelvis performed for low back pain on 08/31/2021 with incidental finding of endometrial thickening up to 1 cm, suboptimally evaluated.  There is no pelvic free fluid.  No adenopathy.  2.5 cm right ovarian cyst noted.  Other MRI findings: no hip fracture, dislocation or avascular necrosis.  There is mild osteoarthritis of bilateral hips.  There is a large high-grade partial-thickness tear of the left hamstring origin.  Partial-thickness tear of the left gluteus medius tendon insertion.  Mild osteoarthritis of bilateral SI joints also noted. ?  ?09/04/2021 Imaging  ? Pelvic ultrasound exam on 09/04/2021 shows a uterus measuring 6.4 x 3.1 x 4.2 cm with an endometrial lining of 1.1 cm with fluid.  Irregular appearance of the right ovary with simple follicle measuring up to 5 mm, avascular.  Simple cyst adjacent to the right ovary measures up to 2.7 cm and also avascular.  Left ovary noted to be normal. ?  ?09/12/2021 Initial Biopsy  ? EMB: showed rare atypical glands, predominantly mucus with few admixed benign endocervical glands.  Although the rare glandular fragments with cribriform pattern and mild cytologic atypia are extremely scant, they are concerning for at least endometrial hyperplasia. ?  ?10/08/2021 Surgery  ? TRH/BSO, SLN injection with no mapping bilaterally, cystoscopy ?No residual hyperplasia seen on frozen, no malignancy ?  ?Findings:  On EUA, small mobile uterus. Normal upper abdominal survey. Normal appearing omentum and small and large bowel. Normal appendix. Uterus 8cm and normal in appearance with small fundal fibroid. Normal bilateral adnexa with evidence of prior BTL. ICG seen along posterior cervix, mapping not  appreciated in either pelvic LN basin. No obvious adenopathy. Given some difficulty with the uterine manipulator and trouble initially identifying plane between bladder and LUS and cervix, cystoscopy was performed. Bladder dome intact, good efflux noted from bilateral ureteral orifices.  ?  ?10/08/2021 Pathology Results  ? A. UTERUS, CERVIX, BILATERAL FALLOPIAN TUBES AND OVARIES:  ?Invasive well differentiated mucinous endometrial adenocarcinoma, FIGO 1  ?Tumor invades greater than 50% of the myometrium (11 mm /18 mm)  ?Tumor arises within complex atypical mucinous hyperplasia involving a  ?polyp and the anterior and posterior endometrium  ?Adenomyosis  ?Background inactive endometrium with cystic change  ?Benign serous cyst papillary adenofibroma of right ovary  ?Chronic cervicitis with nabothian cysts  ?Benign left ovary and right and left fallopian tubes  ? ?ONCOLOGY TABLE:  ? ?UTERUS, CARCINOMA OR CARCINOSARCOMA: Resection  ? ?Procedure: Total hysterectomy and bilateral salpingo-oophorectomy  ?Histologic Type: Mucinous endometrial adenocarcinoma  ?Histologic Grade: Well differentiated, FIGO 1  ?Myometrial Invasion: Present  ?     Depth of Myometrial Invasion (mm): 11 mm  ?     Myometrial Thickness (mm): 18 mm  ?     Percentage of Myometrial Invasion: 61%  ?Uterine Serosa Involvement: Not identified  ?Cervical stromal Involvement:[Not identified  ?Extent of involvement of other tissue/organs: Not identified  ?Peritoneal/Ascitic Fluid: Not submitted  ?Lymphovascular Invasion: Not identified  ?Regional Lymph Nodes: Not applicable (no lymph nodes submitted or found)  ? ?Distant Metastasis: Not applicable  ?Pathologic Stage Classification (pTNM, AJCC 8th Edition): pT1b, pN n/a  ?Ancillary Studies: MMR / MSI testing will be ordered  ?Representative Tumor Block: A5, A6  ?Comment(s): None  ?  ?10/16/2021  Initial Diagnosis  ? Endometrial cancer ([place]) ?  ? ? ?Interval History: ?Patient reports doing very well after surgery.   She had some pelvic discomfort for about a week, denies any significant abdominal or pelvic pain now.  Recovery was much better than expected.  She endorses regular bowel function with the use of laxative.  She denies any current urinary symptoms.  She denies any vaginal bleeding or discharge.  Endorses a good appetite. ? ?She has not noticed much change in her back pain since the surgery. ? ?Past Medical/Surgical History: ?Past Medical History:  ?Diagnosis Date  ? GERD (gastroesophageal reflux disease)   ? Hypertension   ? Osteoarthritis   ? Osteopenia   ? Parkinson disease ([place])   ? ? ?Past Surgical History:  ?Procedure Laterality Date  ? CARPAL TUNNEL RELEASE Bilateral 1996/1997  ? CYSTOSCOPY N/A 10/08/2021  ? Procedure: CYSTOSCOPY;  Surgeon: Lafonda Mosses, MD;  Location: [facility];  Service: Gynecology;  Laterality: N/A;  ? ROBOTIC ASSISTED TOTAL HYSTERECTOMY WITH BILATERAL SALPINGO OOPHERECTOMY N/A 10/08/2021  ? Procedure: XI ROBOTIC ASSISTED TOTAL HYSTERECTOMY WITH BILATERAL SALPINGO OOPHORECTOMY;SENTINEL LYMPH NODE INJECTION;  Surgeon: Lafonda Mosses, MD;  Location: [facility];  Service: Gynecology;  Laterality: N/A;  ? SQUAMOUS CELL CARCINOMA EXCISION Left 2017  ? Left jawline  ? TUBAL LIGATION  1980's  ? ? ?Family History  ?Problem Relation Age of Onset  ? Endometrial cancer Mother   ? Diabetes Mother   ? Alzheimer's disease Father   ? Coronary artery disease Father   ? Diabetes Sister   ? COPD Sister   ? Diabetes Sister   ? Heart disease Brother   ? Other Brother   ?     MVA   ? Parkinson's disease Brother   ? Heart disease Brother   ? Alcohol abuse Brother   ? Heart disease Brother   ? Parkinson's disease Maternal Aunt   ? Breast cancer Maternal Aunt   ? Colon cancer Paternal Aunt   ? Lung cancer Paternal Uncle   ? Healthy Son   ? Healthy Son   ? Prostate cancer Neg Hx   ? Pancreatic cancer Neg Hx   ? Ovarian cancer Neg Hx   ? ? ?Social History  ? ?Socioeconomic History  ? Marital status: Divorced  ?   Spouse name: Not on file  ? Number of children: Not on file  ? Years of education: Not on file  ? Highest education level: Not on file  ?Occupational History  ? 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 Smoking status: Never  ? Smokeless tobacco: Never  ?Vaping Use  ? Vaping Use: Never used  ?Substance and Sexual Activity  ? Alcohol use: Yes  ?  Comment: rare  ? Drug use: Never  ? Sexual activity: Yes  ?Other Topics Concern  ? Not on file  ?Social History Narrative  ? Not on file  ? ?Social Determinants of Health  ? ?Financial Resource Strain: Not on file  ?Food Insecurity: Not on file  ?Transportation Needs: Not on file  ?Physical Activity: Not on file  ?Stress: Not on file  ?Social Connections: Not on file  ? ? ?Current Medications: ? ?Current Outpatient Medications:  ?  ALPRAZolam (XANAX) 0.25 MG tablet, Take 0.25 mg by mouth 2 (two) times daily as needed for anxiety., Disp: , Rfl:  ?  carbidopa-levodopa (SINEMET IR) 25-100 MG tablet, TAKE 1 TABLET BY MOUTH FOUR TIMES A DAY (  Patient taking differently: Take 1 tablet by mouth 4 (four) times daily.), Disp: 360 tablet, Rfl: 3 ?  lisinopril (ZESTRIL) 10 MG tablet, Take 1 tablet (10 mg total) by mouth daily. May take extra 5 mg (half tablet) daily if BP over 160, Disp: 140 tablet, Rfl: 3 ?  Multiple Vitamin (MULTIVITAMIN) tablet, Take 1 tablet by mouth daily., Disp: , Rfl:  ?  polyethylene glycol powder (GLYCOLAX/MIRALAX) 17 GM/SCOOP powder, Take 17 g by mouth every other day., Disp: , Rfl:  ?  acetaminophen (TYLENOL) 500 MG tablet, Take 1,000 mg by mouth every 6 (six) hours as needed for mild pain., Disp: , Rfl:  ?  aspirin EC 81 MG tablet, Take 81 mg by mouth daily. (Patient not taking: Reported on 10/22/2021), Disp: , Rfl:  ?  ibuprofen (ADVIL) 200 MG tablet, Take 200-400 mg by mouth every 6 (six) hours as needed (alternate with tylenol for pain)., Disp: , Rfl:  ?  oxyCODONE (OXY IR/ROXICODONE) 5 MG immediate release tablet, Take 1 tablet (5  mg total) by mouth every 6 (six) hours as needed for severe pain. For AFTER surgery only, do not take and drive (Patient not taking: Reported on 10/22/2021), Disp: 15 tablet, Rfl: 0 ? ?Review of Systems:

## 2021-10-28 NOTE — Progress Notes (Signed)
Gynecologic Oncology Return Clinic Visit ? ?10/28/2021 ? ?Reason for Visit: Follow-up after recent surgery, treatment discussion, surgery planning ? ?Treatment History: ?Oncology History Overview Note  ?MMR IHC intact ?MS stable ?  ?Endometrial cancer ([facility])  ?08/31/2021 Imaging  ? MRI of the pelvis performed for low back pain on 08/31/2021 with incidental finding of endometrial thickening up to 1 cm, suboptimally evaluated.  There is no pelvic free fluid.  No adenopathy.  2.5 cm right ovarian cyst noted.  Other MRI findings: no hip fracture, dislocation or avascular necrosis.  There is mild osteoarthritis of bilateral hips.  There is a large high-grade partial-thickness tear of the left hamstring origin.  Partial-thickness tear of the left gluteus medius tendon insertion.  Mild osteoarthritis of bilateral SI joints also noted. ?  ?09/04/2021 Imaging  ? Pelvic ultrasound exam on 09/04/2021 shows a uterus measuring 6.4 x 3.1 x 4.2 cm with an endometrial lining of 1.1 cm with fluid.  Irregular appearance of the right ovary with simple follicle measuring up to 5 mm, avascular.  Simple cyst adjacent to the right ovary measures up to 2.7 cm and also avascular.  Left ovary noted to be normal. ?  ?09/12/2021 Initial Biopsy  ? EMB: showed rare atypical glands, predominantly mucus with few admixed benign endocervical glands.  Although the rare glandular fragments with cribriform pattern and mild cytologic atypia are extremely scant, they are concerning for at least endometrial hyperplasia. ?  ?10/08/2021 Surgery  ? TRH/BSO, SLN injection with no mapping bilaterally, cystoscopy ?No residual hyperplasia seen on frozen, no malignancy ?  ?Findings:  On EUA, small mobile uterus. Normal upper abdominal survey. Normal appearing omentum and small and large bowel. Normal appendix. Uterus 8cm and normal in appearance with small fundal fibroid. Normal bilateral adnexa with evidence of prior BTL. ICG seen along posterior cervix, mapping not  appreciated in either pelvic LN basin. No obvious adenopathy. Given some difficulty with the uterine manipulator and trouble initially identifying plane between bladder and LUS and cervix, cystoscopy was performed. Bladder dome intact, good efflux noted from bilateral ureteral orifices.  ?  ?10/08/2021 Pathology Results  ? A. UTERUS, CERVIX, BILATERAL FALLOPIAN TUBES AND OVARIES:  ?Invasive well differentiated mucinous endometrial adenocarcinoma, FIGO 1  ?Tumor invades greater than 50% of the myometrium (11 mm /18 mm)  ?Tumor arises within complex atypical mucinous hyperplasia involving a  ?polyp and the anterior and posterior endometrium  ?Adenomyosis  ?Background inactive endometrium with cystic change  ?Benign serous cyst papillary adenofibroma of right ovary  ?Chronic cervicitis with nabothian cysts  ?Benign left ovary and right and left fallopian tubes  ? ?ONCOLOGY TABLE:  ? ?UTERUS, CARCINOMA OR CARCINOSARCOMA: Resection  ? ?Procedure: Total hysterectomy and bilateral salpingo-oophorectomy  ?Histologic Type: Mucinous endometrial adenocarcinoma  ?Histologic Grade: Well differentiated, FIGO 1  ?Myometrial Invasion: Present  ?     Depth of Myometrial Invasion (mm): 11 mm  ?     Myometrial Thickness (mm): 18 mm  ?     Percentage of Myometrial Invasion: 61%  ?Uterine Serosa Involvement: Not identified  ?Cervical stromal Involvement:[Not identified  ?Extent of involvement of other tissue/organs: Not identified  ?Peritoneal/Ascitic Fluid: Not submitted  ?Lymphovascular Invasion: Not identified  ?Regional Lymph Nodes: Not applicable (no lymph nodes submitted or found)  ? ?Distant Metastasis: Not applicable  ?Pathologic Stage Classification (pTNM, AJCC 8th Edition): pT1b, pN n/a  ?Ancillary Studies: MMR / MSI testing will be ordered  ?Representative Tumor Block: A5, A6  ?Comment(s): None  ?  ?10/16/2021   Initial Diagnosis  ? Endometrial cancer ([facility]) ?  ? ? ?Interval History: ?Patient reports doing very well after surgery.   She had some pelvic discomfort for about a week, denies any significant abdominal or pelvic pain now.  Recovery was much better than expected.  She endorses regular bowel function with the use of laxative.  She denies any current urinary symptoms.  She denies any vaginal bleeding or discharge.  Endorses a good appetite. ? ?She has not noticed much change in her back pain since the surgery. ? ?Past Medical/Surgical History: ?Past Medical History:  ?Diagnosis Date  ? GERD (gastroesophageal reflux disease)   ? Hypertension   ? Osteoarthritis   ? Osteopenia   ? Parkinson disease ([facility])   ? ? ?Past Surgical History:  ?Procedure Laterality Date  ? CARPAL TUNNEL RELEASE Bilateral 1996/1997  ? CYSTOSCOPY N/A 10/08/2021  ? Procedure: CYSTOSCOPY;  Surgeon: Fredda Clarida R, MD;  Location: [facility];  Service: Gynecology;  Laterality: N/A;  ? ROBOTIC ASSISTED TOTAL HYSTERECTOMY WITH BILATERAL SALPINGO OOPHERECTOMY N/A 10/08/2021  ? Procedure: XI ROBOTIC ASSISTED TOTAL HYSTERECTOMY WITH BILATERAL SALPINGO OOPHORECTOMY;SENTINEL LYMPH NODE INJECTION;  Surgeon: Vitor Overbaugh R, MD;  Location: [facility];  Service: Gynecology;  Laterality: N/A;  ? SQUAMOUS CELL CARCINOMA EXCISION Left 2017  ? Left jawline  ? TUBAL LIGATION  1980's  ? ? ?Family History  ?Problem Relation Age of Onset  ? Endometrial cancer Mother   ? Diabetes Mother   ? Alzheimer's disease Father   ? Coronary artery disease Father   ? Diabetes Sister   ? COPD Sister   ? Diabetes Sister   ? Heart disease Brother   ? Other Brother   ?     MVA   ? Parkinson's disease Brother   ? Heart disease Brother   ? Alcohol abuse Brother   ? Heart disease Brother   ? Parkinson's disease Maternal Aunt   ? Breast cancer Maternal Aunt   ? Colon cancer Paternal Aunt   ? Lung cancer Paternal Uncle   ? Healthy Son   ? Healthy Son   ? Prostate cancer Neg Hx   ? Pancreatic cancer Neg Hx   ? Ovarian cancer Neg Hx   ? ? ?Social History  ? ?Socioeconomic History  ? Marital status: Divorced  ?   Spouse name: Not on file  ? Number of children: Not on file  ? Years of education: Not on file  ? Highest education level: Not on file  ?Occupational History  ? 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 Smoking status: Never  ? Smokeless tobacco: Never  ?Vaping Use  ? Vaping Use: Never used  ?Substance and Sexual Activity  ? Alcohol use: Yes  ?  Comment: rare  ? Drug use: Never  ? Sexual activity: Yes  ?Other Topics Concern  ? Not on file  ?Social History Narrative  ? Not on file  ? ?Social Determinants of Health  ? ?Financial Resource Strain: Not on file  ?Food Insecurity: Not on file  ?Transportation Needs: Not on file  ?Physical Activity: Not on file  ?Stress: Not on file  ?Social Connections: Not on file  ? ? ?Current Medications: ? ?Current Outpatient Medications:  ?  ALPRAZolam (XANAX) 0.25 MG tablet, Take 0.25 mg by mouth 2 (two) times daily as needed for anxiety., Disp: , Rfl:  ?  carbidopa-levodopa (SINEMET IR) 25-100 MG tablet, TAKE 1 TABLET BY MOUTH FOUR TIMES A DAY (  Patient taking differently: Take 1 tablet by mouth 4 (four) times daily.), Disp: 360 tablet, Rfl: 3 ?  lisinopril (ZESTRIL) 10 MG tablet, Take 1 tablet (10 mg total) by mouth daily. May take extra 5 mg (half tablet) daily if BP over 160, Disp: 140 tablet, Rfl: 3 ?  Multiple Vitamin (MULTIVITAMIN) tablet, Take 1 tablet by mouth daily., Disp: , Rfl:  ?  polyethylene glycol powder (GLYCOLAX/MIRALAX) 17 GM/SCOOP powder, Take 17 g by mouth every other day., Disp: , Rfl:  ?  acetaminophen (TYLENOL) 500 MG tablet, Take 1,000 mg by mouth every 6 (six) hours as needed for mild pain., Disp: , Rfl:  ?  aspirin EC 81 MG tablet, Take 81 mg by mouth daily. (Patient not taking: Reported on 10/22/2021), Disp: , Rfl:  ?  ibuprofen (ADVIL) 200 MG tablet, Take 200-400 mg by mouth every 6 (six) hours as needed (alternate with tylenol for pain)., Disp: , Rfl:  ?  oxyCODONE (OXY IR/ROXICODONE) 5 MG immediate release tablet, Take 1 tablet (5  mg total) by mouth every 6 (six) hours as needed for severe pain. For AFTER surgery only, do not take and drive (Patient not taking: Reported on 10/22/2021), Disp: 15 tablet, Rfl: 0 ? ?Review of Systems:

## 2021-10-28 NOTE — Patient Instructions (Addendum)
Plan to have a CT scan before surgery and Dr. Berline Lopes will notify you of the results. ? ?Preparing for your Surgery ? ?Plan for surgery on November 06, 2021 with Dr. Jeral Pinch at [facility] will be scheduled for robotic assisted laparoscopic pelvic lymph node dissection, mini laparotomy (slightly larger incision on your abdomen to remove the surgical specimen), possible laparotomy (large incision on your abdomen if needed), and any other indicated procedures.  ? ?Pre-operative Testing ?-You will receive a phone call from presurgical testing at [facility] to arrange for a pre-operative appointment and lab work. ? ?-Bring your insurance card, copy of an advanced directive if applicable, medication list ? ?-At that visit, you will be asked to sign a consent for a possible blood transfusion in case a transfusion becomes necessary during surgery.  The need for a blood transfusion is rare but having consent is a necessary part of your care.    ? ?-You are fine to keep taking your baby aspirin (81 mg) with your last dose being the [place] AM. ?  ?-Do not take supplements such as fish oil (omega 3), red yeast rice, turmeric before your surgery. You want to avoid medications with aspirin in them including headache powders such as BC or Goody's), Excedrin migraine. ? ?Day Before Surgery at Home ?-You will be asked to take in a light diet the day before surgery. You will be advised you can have clear liquids up until 3 hours before your surgery.   ? ?Eat a light diet the day before surgery.  Examples including soups, broths, toast, yogurt, mashed potatoes.  AVOID GAS PRODUCING FOODS. Things to avoid include carbonated beverages (fizzy beverages, sodas), raw fruits and raw vegetables (uncooked), or beans.  ? ?If your bowels are filled with gas, your surgeon will have difficulty visualizing your pelvic organs which increases your surgical risks. ? ?Your role in recovery ?Your role is  to become active as soon as directed by your doctor, while still giving yourself time to heal.  Rest when you feel tired. You will be asked to do the following in order to speed your recovery: ? ?- Cough and breathe deeply. This helps to clear and expand your lungs and can prevent pneumonia after surgery.  ?- STAY ACTIVE WHEN YOU GET HOME. Do mild physical activity. Walking or moving your legs help your circulation and body functions return to normal. Do not try to get up or walk alone the first time after surgery.   ?-If you develop swelling on one leg or the other, pain in the back of your leg, redness/warmth in one of your legs, please call the office or go to the Emergency Room to have a doppler to rule out a blood clot. For shortness of breath, chest pain-seek care in the Emergency Room as soon as possible. ?- Actively manage your pain. Managing your pain lets you move in comfort. We will ask you to rate your pain on a scale of zero to 10. It is your responsibility to tell your doctor or nurse where and how much you hurt so your pain can be treated. ? ?Special Considerations ?-If you are diabetic, you may be placed on insulin after surgery to have closer control over your blood sugars to promote healing and recovery.  This does not mean that you will be discharged on insulin.  If applicable, your oral antidiabetics will be resumed when you are tolerating a solid diet. ? ?-  Your final pathology results from surgery should be available around one week after surgery and the results will be relayed to you when available. ? ?-FMLA forms can be faxed to [phone] and please allow 5-7 business days for completion. ? ?Pain Management After Surgery ?-You will be prescribed your pain medication and bowel regimen medications before surgery so that you can have these available when you are discharged from the hospital. The pain medication is for use ONLY AFTER surgery and a new prescription will not be given.  ? ?-Make  sure that you have Tylenol and Ibuprofen at home to use on a regular basis after surgery for pain control. We recommend alternating the medications every hour to six hours since they work differently and are processed in the body differently for pain relief. ? ?-Review the attached handout on narcotic use and their risks and side effects.  ? ?Bowel Regimen ?-You continue taking the miralax you have at home.  It is important to prevent constipation and drink adequate amounts of liquids.  ? ?Risks of Surgery ?Risks of surgery are low but include bleeding, infection, damage to surrounding structures, re-operation, blood clots, and very rarely death. ? ? ?Blood Transfusion Information (For the consent to be signed before surgery) ? ?We will be checking your blood type before surgery so in case of emergencies, we will know what type of blood you would need. ? ?                                          WHAT IS A BLOOD TRANSFUSION? ? ?A transfusion is the replacement of blood or some of its parts. Blood is made up of multiple cells which provide different functions. ?Red blood cells carry oxygen and are used for blood loss replacement. ?White blood cells fight against infection. ?Platelets control bleeding. ?Plasma helps clot blood. ?Other blood products are available for specialized needs, such as hemophilia or other clotting disorders. ?BEFORE THE TRANSFUSION  ?Who gives blood for transfusions?  ?You may be able to donate blood to be used at a later date on yourself (autologous donation). ?Relatives can be asked to donate blood. This is generally not any safer than if you have received blood from a stranger. The same precautions are taken to ensure safety when a relative's blood is donated. ?Healthy volunteers who are fully evaluated to make sure their blood is safe. This is blood bank blood. ?Transfusion therapy is the safest it has ever been in the practice of medicine. Before blood is taken from a donor, a complete  history is taken to make sure that person has no history of diseases nor engages in risky social behavior (examples are intravenous drug use or sexual activity with multiple partners). The donor's travel history is screened to minimize risk of transmitting infections, such as malaria. The donated blood is tested for signs of infectious diseases, such as HIV and hepatitis. The blood is then tested to be sure it is compatible with you in order to minimize the chance of a transfusion reaction. If you or a relative donates blood, this is often done in anticipation of surgery and is not appropriate for emergency situations. It takes many days to process the donated blood. ?RISKS AND COMPLICATIONS ?Although transfusion therapy is very safe and saves many lives, the main dangers of transfusion include:  ?Getting an infectious disease. ?Developing a transfusion reaction.  This is an allergic reaction to something in the blood you were given. Every precaution is taken to prevent this. ?The decision to have a blood transfusion has been considered carefully by your caregiver before blood is given. Blood is not given unless the benefits outweigh the risks. ? ?AFTER SURGERY INSTRUCTIONS ? ?Return to work: 4-6 weeks if applicable ? ?Activity: ?1. Be up and out of the bed during the day.  Take a nap if needed.  You may walk up steps but be careful and use the hand rail.  Stair climbing will tire you more than you think, you may need to stop part way and rest.  ? ?2. No lifting or straining for 6 weeks over 10 pounds. No pushing, pulling, straining for 6 weeks. ? ?3. No driving for around 1 week(s).  Do not drive if you are taking narcotic pain medicine and make sure that your reaction time has returned.  ? ?4. You can shower as soon as the next day after surgery. Shower daily.  Use your regular soap and water (not directly on the incision) and pat your incision(s) dry afterwards; don't rub.  No tub baths or submerging your body in  water until cleared by your surgeon. If you have the soap that was given to you by pre-surgical testing that was used before surgery, you do not need to use it afterwards because this can irritate your

## 2021-10-28 NOTE — Progress Notes (Addendum)
PCP - Faustino Congress NP ?Cardiologist - Dr. Irish Lack, Serena Colonel Does not see regularly saw years ago ? ?PPM/ICD -  ?Device Orders -  ?Rep Notified -  ? ?Chest x-ray -  ?EKG - 01-18-21 epic ?Stress Test -  ?ECHO -  ?Cardiac Cath -  ? ?Sleep Study -  ?CPAP -  ? ?Fasting Blood Sugar -  ?Checks Blood Sugar _____ times a day ? ?Blood Thinner Instructions: ?Aspirin Instructions: ? ?[facility] - ?PRE-SURGERY  ? ? ?COVID vaccine - x1 J&J ? ?Activity--Able to walk a flight of stairs without SOB, Walk a mile a day ?Anesthesia review: HTN , parkinsons ? ?Patient denies shortness of breath, fever, cough and chest pain at PAT appointment ? ? ?All instructions explained to the patient, with a verbal understanding of the material. Patient agrees to go over the instructions while at home for a better understanding. Patient also instructed to self quarantine after being tested for COVID-19. The opportunity to ask questions was provided. ?  ?

## 2021-10-28 NOTE — Patient Instructions (Addendum)
DUE TO COVID-19 ONLY ONE VISITOR  (aged 76 and older)  IS ALLOWED TO COME WITH YOU AND STAY IN THE WAITING ROOM ONLY DURING PRE OP AND PROCEDURE.   ? ?**NO VISITORS ARE ALLOWED IN THE SHORT STAY AREA OR RECOVERY ROOM!!** ? ?IF YOU WILL BE ADMITTED INTO THE HOSPITAL YOU ARE ALLOWED ONLY TWO SUPPORT PEOPLE DURING VISITATION HOURS ONLY (7 AM -8PM)   ?The support person(s) must pass our screening, gel in and out, and wear a mask at all times, including in the patient?s room. ?Patients must also wear a mask when staff or their support person are in the room. ?Visitors GUEST BADGE MUST BE WORN VISIBLY  ?One adult visitor may remain with you overnight and MUST be in the room by 8 P.M. ?  ? ? Your procedure is scheduled on: 11-06-21 ? ? Report to [facility] Main Entrance ? ?  Report to admitting at     0850  AM ? ? Call this number if you have problems the morning of surgery [phone] ? ? ?Eat a light diet the day before surgery.  Examples including soups, broths, toast, yogurt, mashed potatoes.  Things to avoid include carbonated beverages (fizzy beverages), raw fruits and raw vegetables, or beans.  ? ?If your bowels are filled with gas, your surgeon will have difficulty visualizing your pelvic organs which increases your surgical risks.  ? ? Do not eat food :After Midnight. ? ? After Midnight you may have the following liquids until _0800_____ AM/  DAY OF SURGERY  Then nothing by mouth ? ?Water ?Black Coffee (sugar ok, NO MILK/CREAM OR CREAMERS)  ?Tea (sugar ok, NO MILK/CREAM OR CREAMERS) regular and decaf                             ?Plain Jell-O (NO RED)                                           ?Fruit ices (not with fruit pulp, NO RED)                                     ?Popsicles (NO RED)                                                                  ?Juice: apple, WHITE grape, WHITE cranberry ?Sports drinks like Gatorade (NO RED) ?Clear broth(vegetable,chicken,beef) ? ?             ? ?  ?  ?The day of  surgery:  ?Drink ONE (1) Pre-Surgery Clear Ensure  at    0800  AM the morning of surgery. Drink in one sitting. Do not sip.  ?This drink was given to you during your hospital  ?pre-op appointment visit. ?Nothing else to drink after completing the  ?Pre-Surgery Clear Ensure  ?  ?       If you have questions, please contact your surgeon?s office. ? ? ?FOLLOW BOWEL PREP AND ANY ADDITIONAL PRE OP INSTRUCTIONS YOU RECEIVED FROM YOUR  SURGEON'S OFFICE!!! ?  ?  ?Oral Hygiene is also important to reduce your risk of infection.                                    ?Remember - BRUSH YOUR TEETH THE MORNING OF SURGERY WITH YOUR REGULAR TOOTHPASTE ? ? Do NOT smoke after Midnight ? ? Take these medicines the morning of surgery with A SIP OF WATER: carbidopa- Levodopa, xanax if needed ? ?DO NOT TAKE ANY ORAL DIABETIC MEDICATIONS DAY OF YOUR SURGERY ? ? ?                  ?           You may not have any metal on your body including hair pins, jewelry, and body piercing ? ?           Do not wear make-up, lotions, powders, perfumes/cologne, or deodorant ? ?Do not wear nail polish including gel and S&S, artificial/acrylic nails, or any other type of covering on natural nails including finger and toenails. If you have artificial nails, gel coating, etc. that needs to be removed by a nail salon please have this removed prior to surgery or surgery may need to be canceled/ delayed if the surgeon/ anesthesia feels like they are unable to be safely monitored.  ? ?Do not shave  48 hours prior to surgery.  ? ?           ? ? Do not bring valuables to the hospital. [facility] NOT ?            RESPONSIBLE   FOR VALUABLES. ? ? Contacts, dentures or bridgework may not be worn into surgery. ? ? Bring small overnight bag day of surgery. ?  ? Patients discharged on the day of surgery will not be allowed to drive home.  Someone NEEDS to stay with you for the first 24 hours after anesthesia. ? ? Special Instructions: Bring a copy of your healthcare  power of attorney and living will documents         the day of surgery if you haven't scanned them before. ? ?            Please read over the following fact sheets you were given: IF [place] [phone] ? ?   [facility] - Preparing for Surgery ?Before surgery, you can play an important role.  Because skin is not sterile, your skin needs to be as free of germs as possible.  You can reduce the number of germs on your skin by washing with CHG (chlorahexidine gluconate) soap before surgery.  CHG is an antiseptic cleaner which kills germs and bonds with the skin to continue killing germs even after washing. ?Please DO NOT use if you have an allergy to CHG or antibacterial soaps.  If your skin becomes reddened/irritated stop using the CHG and inform your nurse when you arrive at Short Stay. ?Do not shave (including legs and underarms) for at least 48 hours prior to the first CHG shower.  You may shave your face/neck. ?Please follow these instructions carefully: ? 1.  Shower with CHG Soap the night before surgery and the  morning of Surgery. ? 2.  If you choose to wash your hair, wash your hair first as usual with your  normal  shampoo. ? 3.  After you  shampoo, rinse your hair and body thoroughly to remove the  shampoo.                           4.  Use CHG as you would any other liquid soap.  You can apply chg directly  to the skin and wash  ?                     Gently with a scrungie or clean washcloth. ? 5.  Apply the CHG Soap to your body ONLY FROM THE NECK DOWN.   Do not use on face/ open      ?                     Wound or open sores. Avoid contact with eyes, ears mouth and genitals (private parts).  ?                     Production manager,  Genitals (private parts) with your normal soap. ?            6.  Wash thoroughly, paying special attention to the area where your surgery  will be performed. ? 7.  Thoroughly rinse your body with warm water from the neck  down. ? 8.  DO NOT shower/wash with your normal soap after using and rinsing off  the CHG Soap. ?               9.  Pat yourself dry with a clean towel. ?           10.  Wear clean pajamas. ?           11.  Place clean sheets on your bed the night of your first shower and do not  sleep with pets. ?Day of Surgery : ?Do not apply any lotions/deodorants the morning of surgery.  Please wear clean clothes to the hospital/surgery center. ? ?FAILURE TO FOLLOW THESE INSTRUCTIONS MAY RESULT IN THE CANCELLATION OF YOUR SURGERY ?PATIENT SIGNATURE_________________________________ ? ?NURSE SIGNATURE__________________________________ ? ?________________________________________________________________________  ?

## 2021-10-29 ENCOUNTER — Encounter (HOSPITAL_COMMUNITY): Payer: Self-pay

## 2021-10-29 ENCOUNTER — Ambulatory Visit (HOSPITAL_COMMUNITY)
Admission: RE | Admit: 2021-10-29 | Discharge: 2021-10-29 | Disposition: A | Discharge status: Home / Self Care | Payer: PPO | Source: Ambulatory Visit | Attending: Gynecologic Oncology | Admitting: Gynecologic Oncology

## 2021-10-29 DIAGNOSIS — C541 Malignant neoplasm of endometrium: Secondary | ICD-10-CM | POA: Diagnosis not present

## 2021-10-29 DIAGNOSIS — K8020 Calculus of gallbladder without cholecystitis without obstruction: Secondary | ICD-10-CM | POA: Diagnosis not present

## 2021-10-29 DIAGNOSIS — Z8541 Personal history of malignant neoplasm of cervix uteri: Secondary | ICD-10-CM | POA: Diagnosis not present

## 2021-10-29 DIAGNOSIS — K3189 Other diseases of stomach and duodenum: Secondary | ICD-10-CM | POA: Diagnosis not present

## 2021-10-29 DIAGNOSIS — C55 Malignant neoplasm of uterus, part unspecified: Secondary | ICD-10-CM | POA: Diagnosis not present

## 2021-10-29 MED ORDER — SODIUM CHLORIDE (PF) 0.9 % IJ SOLN
INTRAMUSCULAR | Status: AC
  Filled 2021-10-29: qty 50

## 2021-10-29 MED ORDER — IOHEXOL 300 MG/ML  SOLN
100.0000 mL | Freq: Once | INTRAMUSCULAR | Status: AC | PRN
  Administered 2021-10-29: 100 mL via INTRAVENOUS

## 2021-10-29 NOTE — Patient Instructions (Signed)
Plan to have a CT scan before surgery and Dr. Berline Lopes will notify you of the results. ?  ?Preparing for your Surgery ?  ?Plan for surgery on November 06, 2021 with Dr. Jeral Pinch at [facility] will be scheduled for robotic assisted laparoscopic pelvic lymph node dissection, mini laparotomy (slightly larger incision on your abdomen to remove the surgical specimen), possible laparotomy (large incision on your abdomen if needed), and any other indicated procedures.  ?  ?Pre-operative Testing ?-You will receive a phone call from presurgical testing at [facility] to arrange for a pre-operative appointment and lab work. ?  ?-Bring your insurance card, copy of an advanced directive if applicable, medication list ?  ?-At that visit, you will be asked to sign a consent for a possible blood transfusion in case a transfusion becomes necessary during surgery.  The need for a blood transfusion is rare but having consent is a necessary part of your care.    ?  ?-You are fine to keep taking your baby aspirin (81 mg) with your last dose being the [place] AM. ?  ?-Do not take supplements such as fish oil (omega 3), red yeast rice, turmeric before your surgery. You want to avoid medications with aspirin in them including headache powders such as BC or Goody's), Excedrin migraine. ?  ?Day Before Surgery at Home ?-You will be asked to take in a light diet the day before surgery. You will be advised you can have clear liquids up until 3 hours before your surgery.   ?  ?Eat a light diet the day before surgery.  Examples including soups, broths, toast, yogurt, mashed potatoes.  AVOID GAS PRODUCING FOODS. Things to avoid include carbonated beverages (fizzy beverages, sodas), raw fruits and raw vegetables (uncooked), or beans.  ?  ?If your bowels are filled with gas, your surgeon will have difficulty visualizing your pelvic organs which increases your surgical risks. ?  ?Your role in  recovery ?Your role is to become active as soon as directed by your doctor, while still giving yourself time to heal.  Rest when you feel tired. You will be asked to do the following in order to speed your recovery: ?  ?- Cough and breathe deeply. This helps to clear and expand your lungs and can prevent pneumonia after surgery.  ?- STAY ACTIVE WHEN YOU GET HOME. Do mild physical activity. Walking or moving your legs help your circulation and body functions return to normal. Do not try to get up or walk alone the first time after surgery.   ?-If you develop swelling on one leg or the other, pain in the back of your leg, redness/warmth in one of your legs, please call the office or go to the Emergency Room to have a doppler to rule out a blood clot. For shortness of breath, chest pain-seek care in the Emergency Room as soon as possible. ?- Actively manage your pain. Managing your pain lets you move in comfort. We will ask you to rate your pain on a scale of zero to 10. It is your responsibility to tell your doctor or nurse where and how much you hurt so your pain can be treated. ?  ?Special Considerations ?-If you are diabetic, you may be placed on insulin after surgery to have closer control over your blood sugars to promote healing and recovery.  This does not mean that you will be discharged on insulin.  If applicable, your oral  antidiabetics will be resumed when you are tolerating a solid diet. ?  ?-Your final pathology results from surgery should be available around one week after surgery and the results will be relayed to you when available. ?  ?-FMLA forms can be faxed to [phone] and please allow 5-7 business days for completion. ?  ?Pain Management After Surgery ?-You will be prescribed your pain medication and bowel regimen medications before surgery so that you can have these available when you are discharged from the hospital. The pain medication is for use ONLY AFTER surgery and a new prescription  will not be given.  ?  ?-Make sure that you have Tylenol and Ibuprofen at home to use on a regular basis after surgery for pain control. We recommend alternating the medications every hour to six hours since they work differently and are processed in the body differently for pain relief. ?  ?-Review the attached handout on narcotic use and their risks and side effects.  ?  ?Bowel Regimen ?-You continue taking the miralax you have at home.  It is important to prevent constipation and drink adequate amounts of liquids.  ?  ?Risks of Surgery ?Risks of surgery are low but include bleeding, infection, damage to surrounding structures, re-operation, blood clots, and very rarely death. ?  ?  ?Blood Transfusion Information (For the consent to be signed before surgery) ?  ?We will be checking your blood type before surgery so in case of emergencies, we will know what type of blood you would need. ?  ?                                          WHAT IS A BLOOD TRANSFUSION? ?  ?A transfusion is the replacement of blood or some of its parts. Blood is made up of multiple cells which provide different functions. ?Red blood cells carry oxygen and are used for blood loss replacement. ?White blood cells fight against infection. ?Platelets control bleeding. ?Plasma helps clot blood. ?Other blood products are available for specialized needs, such as hemophilia or other clotting disorders. ?BEFORE THE TRANSFUSION  ?Who gives blood for transfusions?  ?You may be able to donate blood to be used at a later date on yourself (autologous donation). ?Relatives can be asked to donate blood. This is generally not any safer than if you have received blood from a stranger. The same precautions are taken to ensure safety when a relative's blood is donated. ?Healthy volunteers who are fully evaluated to make sure their blood is safe. This is blood bank blood. ?Transfusion therapy is the safest it has ever been in the practice of medicine. Before  blood is taken from a donor, a complete history is taken to make sure that person has no history of diseases nor engages in risky social behavior (examples are intravenous drug use or sexual activity with multiple partners). The donor's travel history is screened to minimize risk of transmitting infections, such as malaria. The donated blood is tested for signs of infectious diseases, such as HIV and hepatitis. The blood is then tested to be sure it is compatible with you in order to minimize the chance of a transfusion reaction. If you or a relative donates blood, this is often done in anticipation of surgery and is not appropriate for emergency situations. It takes many days to process the donated blood. ?RISKS AND COMPLICATIONS ?Although  transfusion therapy is very safe and saves many lives, the main dangers of transfusion include:  ?Getting an infectious disease. ?Developing a transfusion reaction. This is an allergic reaction to something in the blood you were given. Every precaution is taken to prevent this. ?The decision to have a blood transfusion has been considered carefully by your caregiver before blood is given. Blood is not given unless the benefits outweigh the risks. ?  ?AFTER SURGERY INSTRUCTIONS ?  ?Return to work: 4-6 weeks if applicable ?  ?Activity: ?1. Be up and out of the bed during the day.  Take a nap if needed.  You may walk up steps but be careful and use the hand rail.  Stair climbing will tire you more than you think, you may need to stop part way and rest.  ?  ?2. No lifting or straining for 6 weeks over 10 pounds. No pushing, pulling, straining for 6 weeks. ?  ?3. No driving for around 1 week(s).  Do not drive if you are taking narcotic pain medicine and make sure that your reaction time has returned.  ?  ?4. You can shower as soon as the next day after surgery. Shower daily.  Use your regular soap and water (not directly on the incision) and pat your incision(s) dry afterwards; don't  rub.  No tub baths or submerging your body in water until cleared by your surgeon. If you have the soap that was given to you by pre-surgical testing that was used before surgery, you do not need to use it afterwards b

## 2021-10-29 NOTE — Progress Notes (Signed)
Patient here for follow up with Dr. Jeral Pinch and for a pre-operative discussion prior to her scheduled surgery on November 06, 2021. She is scheduled for robotic assisted laparoscopic pelvic lymph node dissection, mini laparotomy (slightly larger incision on your abdomen to remove the surgical specimen), possible laparotomy (large incision on your abdomen if needed), and any other indicated procedures, possible para-aortic lymphadenectomy. The surgery was discussed in detail.  See after visit summary for additional details.  ?  ?Discussed post-op pain management in detail including the aspects of the enhanced recovery pathway.  Advised her that a new prescription would be sent in for oxycodone and it is only to be used for after her upcoming surgery.  We discussed the use of tylenol post-op and to monitor for a maximum of 4,000 mg in a 24 hour period.  Also will be prescribed sennakot to be used after surgery and to hold if having loose stools.  Discussed bowel regimen in detail.   ?  ?Discussed the use of SCDs and measures to take at home to prevent DVT including frequent mobility.  Reportable signs and symptoms of DVT discussed. Post-operative instructions discussed and expectations for after surgery. Incisional care discussed as well including reportable signs and symptoms including erythema, drainage, wound separation.  ?   ?5 minutes spent with the patient.  Verbalizing understanding of material discussed. No needs or concerns voiced at the end of the visit.   Advised patient to call for any needs. CT contrast given. ? ?This appointment is included in the global surgical bundle as pre-operative teaching and has no charge.     ?

## 2021-10-31 ENCOUNTER — Encounter (HOSPITAL_COMMUNITY): Payer: Self-pay

## 2021-10-31 ENCOUNTER — Encounter (HOSPITAL_COMMUNITY)
Admission: RE | Admit: 2021-10-31 | Discharge: 2021-10-31 | Disposition: A | Discharge status: Home / Self Care | Payer: PPO | Source: Ambulatory Visit | Attending: Gynecologic Oncology | Admitting: Gynecologic Oncology

## 2021-10-31 ENCOUNTER — Other Ambulatory Visit: Payer: Self-pay

## 2021-10-31 DIAGNOSIS — Z01812 Encounter for preprocedural laboratory examination: Secondary | ICD-10-CM | POA: Diagnosis not present

## 2021-10-31 DIAGNOSIS — C541 Malignant neoplasm of endometrium: Secondary | ICD-10-CM | POA: Insufficient documentation

## 2021-10-31 HISTORY — DX: Anxiety disorder, unspecified: F41.9

## 2021-10-31 HISTORY — DX: Depression, unspecified: F32.A

## 2021-10-31 HISTORY — DX: Tremor, unspecified: R25.1

## 2021-10-31 HISTORY — DX: Pneumonia, unspecified organism: J18.9

## 2021-10-31 HISTORY — DX: Myoneural disorder, unspecified: G70.9

## 2021-10-31 LAB — CBC
HCT: 38 % (ref 36.0–46.0)
Hemoglobin: 12.9 g/dL (ref 12.0–15.0)
MCH: 31.7 pg (ref 26.0–34.0)
MCHC: 33.9 g/dL (ref 30.0–36.0)
MCV: 93.4 fL (ref 80.0–100.0)
Platelets: 235 10*3/uL (ref 150–400)
RBC: 4.07 MIL/uL (ref 3.87–5.11)
RDW: 13.1 % (ref 11.5–15.5)
WBC: 6 10*3/uL (ref 4.0–10.5)
nRBC: 0 % (ref 0.0–0.2)

## 2021-10-31 LAB — BASIC METABOLIC PANEL
Anion gap: 5 (ref 5–15)
BUN: 16 mg/dL (ref 8–23)
CO2: 28 mmol/L (ref 22–32)
Calcium: 9.7 mg/dL (ref 8.9–10.3)
Chloride: 107 mmol/L (ref 98–111)
Creatinine, Ser: 0.78 mg/dL (ref 0.44–1.00)
GFR, Estimated: >60 mL/min (ref >60)
Glucose, Bld: 91 mg/dL (ref 70–99)
Potassium: 4.2 mmol/L (ref 3.5–5.1)
Sodium: 140 mmol/L (ref 135–145)

## 2021-11-04 ENCOUNTER — Other Ambulatory Visit: Payer: Self-pay | Admitting: Gynecologic Oncology

## 2021-11-04 DIAGNOSIS — G8918 Other acute postprocedural pain: Secondary | ICD-10-CM

## 2021-11-04 DIAGNOSIS — N8502 Endometrial intraepithelial neoplasia [EIN]: Secondary | ICD-10-CM

## 2021-11-04 MED ORDER — OXYCODONE HCL 5 MG PO TABS: 5.0000 mg | ORAL_TABLET | Freq: Four times a day (QID) | ORAL | 0 refills | Status: DC | PRN

## 2021-11-04 NOTE — Progress Notes (Signed)
Post-op pain medication prescribed pre-operatively for patient to keep in a safe place to have for after surgery. ?

## 2021-11-05 ENCOUNTER — Telehealth: Payer: Self-pay | Admitting: *Deleted

## 2021-11-05 NOTE — Telephone Encounter (Signed)
Telephone call to check on pre-operative status.  Patient compliant with pre-operative instructions.  Reinforced nothing to eat after midnight. Clear liquids until 0800. Patient to arrive at 23.  No questions or concerns voiced.  Instructed to call for any needs.  ?

## 2021-11-06 ENCOUNTER — Ambulatory Visit (HOSPITAL_BASED_OUTPATIENT_CLINIC_OR_DEPARTMENT_OTHER): Payer: PPO | Admitting: Anesthesiology

## 2021-11-06 ENCOUNTER — Other Ambulatory Visit: Payer: Self-pay

## 2021-11-06 ENCOUNTER — Ambulatory Visit (HOSPITAL_COMMUNITY): Payer: PPO | Admitting: Anesthesiology

## 2021-11-06 ENCOUNTER — Encounter (HOSPITAL_COMMUNITY): Payer: Self-pay | Admitting: Gynecologic Oncology

## 2021-11-06 ENCOUNTER — Encounter (HOSPITAL_COMMUNITY)
Admission: RE | Disposition: A | Discharge status: Home / Self Care | Payer: Self-pay | Source: Ambulatory Visit | Attending: Gynecologic Oncology

## 2021-11-06 ENCOUNTER — Ambulatory Visit (HOSPITAL_COMMUNITY)
Admission: RE | Admit: 2021-11-06 | Discharge: 2021-11-07 | Disposition: A | Discharge status: Home / Self Care | Payer: PPO | Source: Ambulatory Visit | Attending: Gynecologic Oncology | Admitting: Gynecologic Oncology

## 2021-11-06 DIAGNOSIS — M1990 Unspecified osteoarthritis, unspecified site: Secondary | ICD-10-CM | POA: Diagnosis not present

## 2021-11-06 DIAGNOSIS — I10 Essential (primary) hypertension: Secondary | ICD-10-CM | POA: Diagnosis not present

## 2021-11-06 DIAGNOSIS — G20 Parkinson's disease: Secondary | ICD-10-CM | POA: Diagnosis not present

## 2021-11-06 DIAGNOSIS — C541 Malignant neoplasm of endometrium: Secondary | ICD-10-CM | POA: Diagnosis present

## 2021-11-06 DIAGNOSIS — K654 Sclerosing mesenteritis: Secondary | ICD-10-CM | POA: Diagnosis not present

## 2021-11-06 DIAGNOSIS — F418 Other specified anxiety disorders: Secondary | ICD-10-CM | POA: Diagnosis not present

## 2021-11-06 LAB — TYPE AND SCREEN
ABO/RH(D): O NEG
Antibody Screen: NEGATIVE

## 2021-11-06 SURGERY — LYMPHADENECTOMY, PELVIS, ROBOT-ASSISTED
Anesthesia: General

## 2021-11-06 MED ORDER — BUPIVACAINE LIPOSOME 1.3 % IJ SUSP
INTRAMUSCULAR | Status: AC
  Filled 2021-11-06: qty 20

## 2021-11-06 MED ORDER — OXYCODONE HCL 5 MG PO TABS
5.0000 mg | ORAL_TABLET | ORAL | Status: DC | PRN
  Administered 2021-11-06: 10 mg via ORAL
  Filled 2021-11-06: qty 2

## 2021-11-06 MED ORDER — FENTANYL CITRATE PF 50 MCG/ML IJ SOSY
PREFILLED_SYRINGE | INTRAMUSCULAR | Status: AC
  Administered 2021-11-06: 50 ug via INTRAVENOUS
  Filled 2021-11-06: qty 3

## 2021-11-06 MED ORDER — KCL IN DEXTROSE-NACL 20-5-0.45 MEQ/L-%-% IV SOLN: INTRAVENOUS | Status: DC

## 2021-11-06 MED ORDER — SUGAMMADEX SODIUM 200 MG/2ML IV SOLN
INTRAVENOUS | Status: DC | PRN
  Administered 2021-11-06: 200 mg via INTRAVENOUS

## 2021-11-06 MED ORDER — LIDOCAINE HCL (PF) 2 % IJ SOLN
INTRAMUSCULAR | Status: AC
  Filled 2021-11-06: qty 15

## 2021-11-06 MED ORDER — CHLORHEXIDINE GLUCONATE 0.12 % MT SOLN
15.0000 mL | Freq: Once | OROMUCOSAL | Status: AC
  Administered 2021-11-06: 15 mL via OROMUCOSAL

## 2021-11-06 MED ORDER — AMISULPRIDE (ANTIEMETIC) 5 MG/2ML IV SOLN: 10.0000 mg | Freq: Once | INTRAVENOUS | Status: DC | PRN

## 2021-11-06 MED ORDER — ONDANSETRON HCL 4 MG/2ML IJ SOLN
INTRAMUSCULAR | Status: AC
  Filled 2021-11-06: qty 2

## 2021-11-06 MED ORDER — ONDANSETRON HCL 4 MG/2ML IJ SOLN: 4.0000 mg | Freq: Once | INTRAMUSCULAR | Status: DC | PRN

## 2021-11-06 MED ORDER — ACETAMINOPHEN 500 MG PO TABS: 1000.0000 mg | ORAL_TABLET | Freq: Four times a day (QID) | ORAL | Status: DC | PRN

## 2021-11-06 MED ORDER — PROPOFOL 10 MG/ML IV BOLUS
INTRAVENOUS | Status: DC | PRN
  Administered 2021-11-06: 150 mg via INTRAVENOUS

## 2021-11-06 MED ORDER — CEFAZOLIN SODIUM-DEXTROSE 2-4 GM/100ML-% IV SOLN
2.0000 g | INTRAVENOUS | Status: AC
  Administered 2021-11-06: 2 g via INTRAVENOUS
  Filled 2021-11-06: qty 100

## 2021-11-06 MED ORDER — LACTATED RINGERS IR SOLN
Status: DC | PRN
  Administered 2021-11-06: 1000 mL

## 2021-11-06 MED ORDER — LIDOCAINE 2% (20 MG/ML) 5 ML SYRINGE
INTRAMUSCULAR | Status: DC | PRN
  Administered 2021-11-06: 1.5 mg/kg/h via INTRAVENOUS

## 2021-11-06 MED ORDER — PROPOFOL 10 MG/ML IV BOLUS
INTRAVENOUS | Status: AC
Start: 2021-11-06 — End: ?
  Filled 2021-11-06: qty 20

## 2021-11-06 MED ORDER — STERILE WATER FOR IRRIGATION IR SOLN
Status: DC | PRN
  Administered 2021-11-06: 1000 mL

## 2021-11-06 MED ORDER — FENTANYL CITRATE PF 50 MCG/ML IJ SOSY
25.0000 ug | PREFILLED_SYRINGE | INTRAMUSCULAR | Status: DC | PRN
  Administered 2021-11-06 (×2): 50 ug via INTRAVENOUS

## 2021-11-06 MED ORDER — DEXAMETHASONE SODIUM PHOSPHATE 10 MG/ML IJ SOLN
INTRAMUSCULAR | Status: AC
  Filled 2021-11-06: qty 1

## 2021-11-06 MED ORDER — HEMOSTATIC AGENTS (NO CHARGE) OPTIME
TOPICAL | Status: DC | PRN
  Administered 2021-11-06: 2 via TOPICAL

## 2021-11-06 MED ORDER — BUPIVACAINE HCL 0.25 % IJ SOLN
INTRAMUSCULAR | Status: DC | PRN
  Administered 2021-11-06: 50 mL

## 2021-11-06 MED ORDER — LIDOCAINE 2% (20 MG/ML) 5 ML SYRINGE
INTRAMUSCULAR | Status: DC | PRN
  Administered 2021-11-06: 100 mg via INTRAVENOUS

## 2021-11-06 MED ORDER — ACETAMINOPHEN 500 MG PO TABS: 1000.0000 mg | ORAL_TABLET | Freq: Once | ORAL | Status: DC

## 2021-11-06 MED ORDER — DEXAMETHASONE SODIUM PHOSPHATE 10 MG/ML IJ SOLN
INTRAMUSCULAR | Status: DC | PRN
  Administered 2021-11-06: 5 mg via INTRAVENOUS

## 2021-11-06 MED ORDER — POLYETHYLENE GLYCOL 3350 17 G PO PACK
17.0000 g | PACK | Freq: Every day | ORAL | Status: DC
  Administered 2021-11-07: 17 g via ORAL
  Filled 2021-11-06 (×2): qty 1

## 2021-11-06 MED ORDER — ROCURONIUM BROMIDE 10 MG/ML (PF) SYRINGE
PREFILLED_SYRINGE | INTRAVENOUS | Status: DC | PRN
  Administered 2021-11-06 (×2): 10 mg via INTRAVENOUS
  Administered 2021-11-06: 70 mg via INTRAVENOUS

## 2021-11-06 MED ORDER — ONDANSETRON HCL 4 MG/2ML IJ SOLN: 4.0000 mg | Freq: Four times a day (QID) | INTRAMUSCULAR | Status: DC | PRN

## 2021-11-06 MED ORDER — ACETAMINOPHEN 500 MG PO TABS
1000.0000 mg | ORAL_TABLET | ORAL | Status: AC
  Administered 2021-11-06: 1000 mg via ORAL
  Filled 2021-11-06: qty 2

## 2021-11-06 MED ORDER — HEPARIN SODIUM (PORCINE) 5000 UNIT/ML IJ SOLN
5000.0000 [IU] | INTRAMUSCULAR | Status: AC
  Administered 2021-11-06: 5000 [IU] via SUBCUTANEOUS
  Filled 2021-11-06: qty 1

## 2021-11-06 MED ORDER — HYDROMORPHONE HCL 1 MG/ML IJ SOLN
0.2000 mg | INTRAMUSCULAR | Status: DC | PRN
  Administered 2021-11-06: 0.6 mg via INTRAVENOUS
  Filled 2021-11-06: qty 1

## 2021-11-06 MED ORDER — PHENYLEPHRINE HCL-NACL 20-0.9 MG/250ML-% IV SOLN
INTRAVENOUS | Status: DC | PRN
  Administered 2021-11-06: 35 ug/min via INTRAVENOUS

## 2021-11-06 MED ORDER — BUPIVACAINE HCL 0.25 % IJ SOLN
INTRAMUSCULAR | Status: AC
  Filled 2021-11-06: qty 1

## 2021-11-06 MED ORDER — ONDANSETRON HCL 4 MG PO TABS: 4.0000 mg | ORAL_TABLET | Freq: Four times a day (QID) | ORAL | Status: DC | PRN

## 2021-11-06 MED ORDER — ROCURONIUM BROMIDE 10 MG/ML (PF) SYRINGE
PREFILLED_SYRINGE | INTRAVENOUS | Status: AC
  Filled 2021-11-06: qty 10

## 2021-11-06 MED ORDER — ALPRAZOLAM 0.25 MG PO TABS: 0.2500 mg | ORAL_TABLET | Freq: Two times a day (BID) | ORAL | Status: DC | PRN

## 2021-11-06 MED ORDER — FENTANYL CITRATE (PF) 250 MCG/5ML IJ SOLN
INTRAMUSCULAR | Status: AC
  Filled 2021-11-06: qty 5

## 2021-11-06 MED ORDER — CARBIDOPA-LEVODOPA 25-100 MG PO TABS
1.0000 | ORAL_TABLET | Freq: Four times a day (QID) | ORAL | Status: DC
  Administered 2021-11-06 – 2021-11-07 (×3): 1 via ORAL
  Filled 2021-11-06 (×4): qty 1

## 2021-11-06 MED ORDER — ACETAMINOPHEN 500 MG PO TABS
500.0000 mg | ORAL_TABLET | Freq: Four times a day (QID) | ORAL | Status: DC
  Administered 2021-11-06 – 2021-11-07 (×3): 500 mg via ORAL
  Filled 2021-11-06 (×3): qty 1

## 2021-11-06 MED ORDER — ORAL CARE MOUTH RINSE: 15.0000 mL | Freq: Once | OROMUCOSAL | Status: AC

## 2021-11-06 MED ORDER — ENSURE PRE-SURGERY PO LIQD: 296.0000 mL | Freq: Once | ORAL | Status: DC

## 2021-11-06 MED ORDER — ONDANSETRON HCL 4 MG/2ML IJ SOLN
INTRAMUSCULAR | Status: DC | PRN
Start: 2021-11-06 — End: 2021-11-06
  Administered 2021-11-06: 4 mg via INTRAVENOUS

## 2021-11-06 MED ORDER — DEXAMETHASONE SODIUM PHOSPHATE 4 MG/ML IJ SOLN: 4.0000 mg | INTRAMUSCULAR | Status: DC

## 2021-11-06 MED ORDER — OXYCODONE HCL 5 MG PO TABS: 5.0000 mg | ORAL_TABLET | Freq: Once | ORAL | Status: DC | PRN

## 2021-11-06 MED ORDER — LACTATED RINGERS IV SOLN: INTRAVENOUS | Status: DC

## 2021-11-06 MED ORDER — KCL IN DEXTROSE-NACL 10-5-0.45 MEQ/L-%-% IV SOLN
INTRAVENOUS | Status: DC
  Filled 2021-11-06: qty 1000

## 2021-11-06 MED ORDER — BUPIVACAINE LIPOSOME 1.3 % IJ SUSP
INTRAMUSCULAR | Status: DC | PRN
  Administered 2021-11-06: 20 mL

## 2021-11-06 MED ORDER — LACTATED RINGERS IV SOLN: INTRAVENOUS | Status: DC | PRN

## 2021-11-06 MED ORDER — OXYCODONE HCL 5 MG/5ML PO SOLN: 5.0000 mg | Freq: Once | ORAL | Status: DC | PRN

## 2021-11-06 MED ORDER — FENTANYL CITRATE (PF) 250 MCG/5ML IJ SOLN
INTRAMUSCULAR | Status: DC | PRN
  Administered 2021-11-06: 25 ug via INTRAVENOUS
  Administered 2021-11-06: 100 ug via INTRAVENOUS
  Administered 2021-11-06: 50 ug via INTRAVENOUS
  Administered 2021-11-06: 25 ug via INTRAVENOUS
  Administered 2021-11-06: 50 ug via INTRAVENOUS

## 2021-11-06 SURGICAL SUPPLY — 76 items
APPLICATOR COTTON TIP 6 STRL (MISCELLANEOUS) IMPLANT
APPLICATOR COTTON TIP 6IN STRL (MISCELLANEOUS) ×2
APPLICATOR SURGIFLO ENDO (HEMOSTASIS) ×1 IMPLANT
APPLIER CLIP 5 13 M/L LIGAMAX5 (MISCELLANEOUS) ×2
BACTOSHIELD CHG 4% 4OZ (MISCELLANEOUS) ×1
BAG COUNTER SPONGE SURGICOUNT (BAG) IMPLANT
BAG LAPAROSCOPIC 12 15 PORT 16 (BASKET) IMPLANT
BAG RETRIEVAL 10 (BASKET) ×2
BAG RETRIEVAL 12/15 (BASKET)
BLADE SURG SZ10 CARB STEEL (BLADE) ×1 IMPLANT
CLIP APPLIE 5 13 M/L LIGAMAX5 (MISCELLANEOUS) IMPLANT
CLIP LIGATING HEMO O LOK GREEN (MISCELLANEOUS) ×1 IMPLANT
COVER BACK TABLE 60X90IN (DRAPES) ×2 IMPLANT
COVER TIP SHEARS 8 DVNC (MISCELLANEOUS) ×1 IMPLANT
COVER TIP SHEARS 8MM DA VINCI (MISCELLANEOUS) ×4
DERMABOND ADVANCED (GAUZE/BANDAGES/DRESSINGS) ×1
DERMABOND ADVANCED .7 DNX12 (GAUZE/BANDAGES/DRESSINGS) ×1 IMPLANT
DRAPE ARM DVNC X/XI (DISPOSABLE) ×4 IMPLANT
DRAPE COLUMN DVNC XI (DISPOSABLE) ×1 IMPLANT
DRAPE DA VINCI XI ARM (DISPOSABLE) ×8
DRAPE DA VINCI XI COLUMN (DISPOSABLE) ×2
DRAPE SHEET LG 3/4 BI-LAMINATE (DRAPES) ×2 IMPLANT
DRAPE SURG IRRIG POUCH 19X23 (DRAPES) ×2 IMPLANT
DRSG OPSITE POSTOP 4X6 (GAUZE/BANDAGES/DRESSINGS) IMPLANT
DRSG OPSITE POSTOP 4X8 (GAUZE/BANDAGES/DRESSINGS) IMPLANT
ELECT PENCIL ROCKER SW 15FT (MISCELLANEOUS) ×1 IMPLANT
ELECT REM PT RETURN 15FT ADLT (MISCELLANEOUS) ×2 IMPLANT
GAUZE 4X4 16PLY ~~LOC~~+RFID DBL (SPONGE) ×2 IMPLANT
GLOVE SURG ENC MOIS LTX SZ6 (GLOVE) ×8 IMPLANT
GLOVE SURG ENC MOIS LTX SZ6.5 (GLOVE) ×4 IMPLANT
GOWN STRL REUS W/ TWL LRG LVL3 (GOWN DISPOSABLE) ×4 IMPLANT
GOWN STRL REUS W/TWL LRG LVL3 (GOWN DISPOSABLE) ×8
HOLDER FOLEY CATH W/STRAP (MISCELLANEOUS) IMPLANT
IRRIG SUCT STRYKERFLOW 2 WTIP (MISCELLANEOUS) ×2
IRRIGATION SUCT STRKRFLW 2 WTP (MISCELLANEOUS) ×1 IMPLANT
KIT PROCEDURE DA VINCI SI (MISCELLANEOUS)
KIT PROCEDURE DVNC SI (MISCELLANEOUS) IMPLANT
KIT TURNOVER KIT A (KITS) IMPLANT
MANIPULATOR ADVINCU DEL 3.0 PL (MISCELLANEOUS) IMPLANT
MANIPULATOR ADVINCU DEL 3.5 PL (MISCELLANEOUS) IMPLANT
MANIPULATOR UTERINE 4.5 ZUMI (MISCELLANEOUS) IMPLANT
NDL HYPO 21X1.5 SAFETY (NEEDLE) ×1 IMPLANT
NDL SPNL 18GX3.5 QUINCKE PK (NEEDLE) IMPLANT
NEEDLE HYPO 21X1.5 SAFETY (NEEDLE) ×2 IMPLANT
NEEDLE SPNL 18GX3.5 QUINCKE PK (NEEDLE) IMPLANT
OBTURATOR OPTICAL STANDARD 8MM (TROCAR) ×2
OBTURATOR OPTICAL STND 8 DVNC (TROCAR) ×1
OBTURATOR OPTICALSTD 8 DVNC (TROCAR) ×1 IMPLANT
PACK ROBOT GYN CUSTOM WL (TRAY / TRAY PROCEDURE) ×2 IMPLANT
PAD POSITIONING PINK XL (MISCELLANEOUS) ×2 IMPLANT
PORT ACCESS TROCAR AIRSEAL 12 (TROCAR) ×1 IMPLANT
PORT ACCESS TROCAR AIRSEAL 5M (TROCAR) ×1
SCRUB CHG 4% DYNA-HEX 4OZ (MISCELLANEOUS) ×1 IMPLANT
SEAL CANN UNIV 5-8 DVNC XI (MISCELLANEOUS) ×4 IMPLANT
SEAL XI 5MM-8MM UNIVERSAL (MISCELLANEOUS) ×8
SET TRI-LUMEN FLTR TB AIRSEAL (TUBING) ×2 IMPLANT
SPIKE FLUID TRANSFER (MISCELLANEOUS) ×2 IMPLANT
SPONGE T-LAP 18X18 ~~LOC~~+RFID (SPONGE) IMPLANT
SURGIFLO W/THROMBIN 8M KIT (HEMOSTASIS) ×2 IMPLANT
SUT MNCRL AB 4-0 PS2 18 (SUTURE) IMPLANT
SUT PDS AB 1 TP1 96 (SUTURE) ×2 IMPLANT
SUT VIC AB 0 CT1 27 (SUTURE)
SUT VIC AB 0 CT1 27XBRD ANTBC (SUTURE) IMPLANT
SUT VIC AB 2-0 CT1 27 (SUTURE) ×2
SUT VIC AB 2-0 CT1 TAPERPNT 27 (SUTURE) IMPLANT
SUT VIC AB 4-0 PS2 18 (SUTURE) ×4 IMPLANT
SYR 10ML LL (SYRINGE) IMPLANT
SYS BAG RETRIEVAL 10MM (BASKET) ×2
SYSTEM BAG RETRIEVAL 10MM (BASKET) IMPLANT
TOWEL OR NON WOVEN STRL DISP B (DISPOSABLE) IMPLANT
TRAP SPECIMEN MUCUS 40CC (MISCELLANEOUS) IMPLANT
TRAY FOLEY MTR SLVR 16FR STAT (SET/KITS/TRAYS/PACK) ×2 IMPLANT
TROCAR XCEL NON-BLD 5MMX100MML (ENDOMECHANICALS) IMPLANT
UNDERPAD 30X36 HEAVY ABSORB (UNDERPADS AND DIAPERS) ×4 IMPLANT
WATER STERILE IRR 1000ML POUR (IV SOLUTION) ×2 IMPLANT
YANKAUER SUCT BULB TIP 10FT TU (MISCELLANEOUS) ×1 IMPLANT

## 2021-11-06 NOTE — Transfer of Care (Signed)
Immediate Anesthesia Transfer of Care Note ? ?Patient: Ashley Neal ? ?Procedure(s) Performed: ROBOTIC ASSISTED PELVIC LYMPH NODE DISSECTION,  PARA-AORTIC LYMPHADENECTOMY,  CYSTOSCOPY ? ?Patient Location: PACU ? ?Anesthesia Type:General ? ?Level of Consciousness: awake, alert  and oriented ? ?Airway & Oxygen Therapy: Patient Spontanous Breathing and Patient connected to face mask oxygen ? ?Post-op Assessment: Report given to RN and Post -op Vital signs reviewed and stable ? ?Post vital signs: Reviewed and stable ? ?Last Vitals:  ?Vitals Value Taken Time  ?BP 161/78 11/06/21 1439  ?Temp    ?Pulse 60 11/06/21 1442  ?Resp 9 11/06/21 1442  ?SpO2 100 % 11/06/21 1442  ?Vitals shown include unvalidated device data. ? ?Last Pain:  ?Vitals:  ? 11/06/21 0850  ?TempSrc:   ?PainSc: 0-No pain  ?   ? ?  ? ?Complications: No notable events documented. ?

## 2021-11-06 NOTE — Anesthesia Procedure Notes (Signed)
Procedure Name: Intubation ?Date/Time: 11/06/2021 11:33 AM ?Performed by: Maxwell Caul, CRNA ?Pre-anesthesia Checklist: Patient identified, Emergency Drugs available, Suction available and Patient being monitored ?Patient Re-evaluated:Patient Re-evaluated prior to induction ?Oxygen Delivery Method: Circle system utilized ?Preoxygenation: Pre-oxygenation with 100% oxygen ?Induction Type: IV induction ?Ventilation: Mask ventilation without difficulty ?Laryngoscope Size: Mac and 4 ?Grade View: Grade I ?Tube type: Oral ?Tube size: 7.0 mm ?Number of attempts: 1 ?Airway Equipment and Method: Stylet ?Placement Confirmation: ETT inserted through vocal cords under direct vision, positive ETCO2 and breath sounds checked- equal and bilateral ?Secured at: 22 cm ?Tube secured with: Tape ?Dental Injury: Teeth and Oropharynx as per pre-operative assessment  ? ? ? ? ?

## 2021-11-06 NOTE — Anesthesia Postprocedure Evaluation (Signed)
Anesthesia Post Note ? ?Patient: Ashley Neal ? ?Procedure(s) Performed: ROBOTIC ASSISTED PELVIC LYMPH NODE DISSECTION,  PARA-AORTIC LYMPHADENECTOMY,  CYSTOSCOPY ? ?  ? ?Patient location during evaluation: PACU ?Anesthesia Type: General ?Level of consciousness: awake and alert ?Pain management: pain level controlled ?Vital Signs Assessment: post-procedure vital signs reviewed and stable ?Respiratory status: spontaneous breathing, nonlabored ventilation and respiratory function stable ?Cardiovascular status: blood pressure returned to baseline and stable ?Postop Assessment: no apparent nausea or vomiting ?Anesthetic complications: no ? ? ?No notable events documented. ? ?Last Vitals:  ?Vitals:  ? 11/06/21 1500 11/06/21 1515  ?BP: (!) 155/97 (!) 163/70  ?Pulse: 69 73  ?Resp: 14 17  ?Temp:    ?SpO2: 97% 93%  ?  ?Last Pain:  ?Vitals:  ? 11/06/21 1500  ?TempSrc:   ?PainSc: 6   ? ? ?  ?  ?  ?  ?  ?  ? ?Lidia Collum ? ? ? ? ?

## 2021-11-06 NOTE — Brief Op Note (Signed)
11/06/2021 ? ?2:32 PM ? ?PATIENT:  Ashley Neal  76 y.o. female ? ?PRE-OPERATIVE DIAGNOSIS:  ENDOMETRIAL CANCER ? ?POST-OPERATIVE DIAGNOSIS:  ENDOMETRIAL CANCER ? ?PROCEDURE:  Procedure(s): ?ROBOTIC ASSISTED PELVIC LYMPH NODE DISSECTION,  PARA-AORTIC LYMPHADENECTOMY,  CYSTOSCOPY (N/A) ? ?SURGEON:  Surgeon(s) and Role: ?   Lafonda Mosses, MD - Primary ? ? ?ASSISTANTS: cross, melissa NP ? ?ANESTHESIA:   general ? ?EBL:  75 mL  ? ?BLOOD ADMINISTERED:none ? ?DRAINS: none  ? ?LOCAL MEDICATIONS USED:  MARCAINE    ? ?SPECIMEN:  bilateral pelvic LNs, right para-aortic LNs  ? ?DISPOSITION OF SPECIMEN:  PATHOLOGY ? ?COUNTS:  YES ? ?TOURNIQUET:  * No tourniquets in log * ? ?DICTATION: .Note written in EPIC ? ?PLAN OF CARE: Admit for overnight observation ? ?PATIENT DISPOSITION:  PACU - hemodynamically stable. ?  ?Delay start of Pharmacological VTE agent (>24hrs) due to surgical blood loss or risk of bleeding: not applicable ? ?

## 2021-11-06 NOTE — Anesthesia Preprocedure Evaluation (Signed)
Anesthesia Evaluation  ?Patient identified by MRN, date of birth, ID band ?Patient awake ? ? ? ?Reviewed: ?Allergy & Precautions, NPO status , Patient's Chart, lab work & pertinent test results ? ?History of Anesthesia Complications ?Negative for: history of anesthetic complications ? ?Airway ?Mallampati: II ? ?TM Distance: >3 FB ?Neck ROM: Full ? ? ? Dental ? ?(+) Dental Advisory Given, Upper Dentures, Partial Lower ?  ?Pulmonary ?neg pulmonary ROS,  ?  ?Pulmonary exam normal ? ? ? ? ? ? ? Cardiovascular ?hypertension, Normal cardiovascular exam ? ? ?  ?Neuro/Psych ?Anxiety Depression Parkinson's disease, on Sinemet ?  ? GI/Hepatic ?Neg liver ROS, GERD  ,  ?Endo/Other  ?negative endocrine ROS ? Renal/GU ?negative Renal ROS  ?negative genitourinary ?  ?Musculoskeletal ? ?(+) Arthritis ,  ? Abdominal ?  ?Peds ? Hematology ?negative hematology ROS ?(+)   ?Anesthesia Other Findings ? ? Reproductive/Obstetrics ?Endometrial cancer ? ?  ? ? ? ? ? ? ? ? ? ? ? ? ? ?  ?  ? ? ? ? ? ? ?Anesthesia Physical ?Anesthesia Plan ? ?ASA: 3 ? ?Anesthesia Plan: General  ? ?Post-op Pain Management: Tylenol PO (pre-op)*, Toradol IV (intra-op)* and Lidocaine infusion*  ? ?Induction: Intravenous ? ?PONV Risk Score and Plan: 4 or greater and Ondansetron, Dexamethasone, Treatment may vary due to age or medical condition and Midazolam ? ?Airway Management Planned: Oral ETT ? ?Additional Equipment: None ? ?Intra-op Plan:  ? ?Post-operative Plan: Extubation in OR ? ?Informed Consent: I have reviewed the patients History and Physical, chart, labs and discussed the procedure including the risks, benefits and alternatives for the proposed anesthesia with the patient or authorized representative who has indicated his/her understanding and acceptance.  ? ? ? ?Dental advisory given ? ?Plan Discussed with:  ? ?Anesthesia Plan Comments:   ? ? ? ? ? ? ?Anesthesia Quick Evaluation ? ?

## 2021-11-06 NOTE — Op Note (Signed)
OPERATIVE NOTE ? ?Pre-operative Diagnosis: endometrial cancer grade 1, mucinous type, unstaged ? ?Post-operative Diagnosis: same ? ?Operation: Robotic-assisted bilateral pelvic lymph node dissection, right para-aortic lymph node dissection, cystoscopy ? ?Surgeon: Jeral Pinch MD ? ?Assistant Surgeon: Joylene John, NP ? ?Anesthesia: GET ? ?Urine Output: 150cc ? ?Operative Findings: On EUA, well-healed vaginal cuff.  Normal upper abdominal survey.  Normal-appearing omentum, small and large bowel.  Some filmy adhesions of the sigmoid epiploica to the left pelvic sidewall.  Significant fibrosis noted of bilateral pelvic sidewalls and the retroperitoneal spaces.  Nodular tissue along some areas recent surgical dissection, presumed to be inflammatory from surgery.  No obvious adenopathy.  Fibrosis made pelvic lymph node dissection very challenging with significant adherence of minimal lymphatic tissue to the right external iliac vein.  Anatomy quite challenging to delineate in the setting of this fibrosis as well as recent surgery.  Along the para-aortic lymph node beds, no significant tissue noted along the left aspect and due to retroperitoneal fibrosis, further dissection did not seem safe.  Along the right, what was presumed to be the IVC was displaced laterally.  Cystoscopy, bladder dome noted to be intact, good efflux noted from bilateral ureteral orifices. ? ?Estimated Blood Loss: 150cc     ? ?Total IV Fluids: See I&O flowsheet ?        ?Specimens: Bilateral pelvic lymph nodes, right para-aortic lymph nodes, right pelvic sidewall peritoneum ?        ?Complications:  None apparent; patient tolerated the procedure well. ?        ?Disposition: PACU - hemodynamically stable. ? ?Procedure Details  ?The patient was seen in the Holding Room. The risks, benefits, complications, treatment options, and expected outcomes were discussed with the patient.  The patient concurred with the proposed plan, giving informed  consent.  The site of surgery properly noted/marked. The patient was identified as Ashley Neal and the procedure verified as a Robotic-assisted lymph node dissection, possible laparotomy, and any other indicated procedures.  ? ?After induction of anesthesia, the patient was draped and prepped in the usual sterile manner. Patient was placed in supine position after anesthesia and draped and prepped in the usual sterile manner as follows: Her arms were tucked to her side with all appropriate precautions.  The shoulders were stabilized with padded shoulder blocks applied to the acromium processes.  The patient was placed in the semi-lithotomy position in [place]. The patient was draped after the CholoraPrep had been allowed to dry for 3 minutes.  A Time Out was held and the above information confirmed. ? ?The urethra was prepped with Betadine. Foley catheter was placed. OG tube placement was confirmed and to suction.  ? ?Next, a 10 mm skin incision was made 1 cm below the subcostal margin in the midclavicular line.  The 5 mm Optiview port and scope was used for direct entry.  Opening pressure was under 10 mm CO2.  The abdomen was insufflated and the findings were noted as above.   At this point and all points during the procedure, the patient's intra-abdominal pressure did not exceed 15 mmHg. Next, an 8 mm skin incision was made superior the umbilicus and a right and left port were placed about 8 cm lateral to the robot port on the right and left side.  A fourth arm was placed on the right.  The 5 mm assist trocar was exchanged for a 10-12 mm port. All ports were placed under direct visualization placed at the site of  recent trocar incisions.  The patient was placed in steep Trendelenburg.  Bowel was folded away into the upper abdomen.  The robot was docked in the normal manner. ? ?Significant fibrosis was noted of bilateral pelvic retroperitoneal spaces.  Some of this was thought to be related to recent  surgery although this had also been noted at the time of her initial hysterectomy.  I suspect that this fibrosis was related to the reason that she did not map.  Given some nodular tissue along previously dissected planes.  A small area of this was removed and sent for pathology to assure not related to recent diagnosis of malignancy. ? ?With some care, bilateral ureters were noted.  The paravesical and pararectal spaces were developed.  This took quite some time given fibrotic tissue that bled very easily. ? ?A pelvic lymph dissection was performed on the right with the following borders: proximally the bifurcation of the common iliac, distally the circumflex iliac vein, laterally the genitofemoral nerve, the medial border was the superior vesicle artery and the deep border was the obturator nerve. All lymphatic tissue was removed and sent to Pathology. A similar procedure was performed on the contralateral side.  ? ?The para-aortic lymph node dissection was performed first on the right. The peritoneum overlying the lateral surface of the common iliac and vena cava was opened vertically towards the duodenum. It was elevated as a shelf and the ureter was identified in this retroperitoneum. It was mobilized and retracted laterally with the 4th arm.  There was a large venous structure lateral to this. The right para-aortic dissection took place by separating an enbloc segment of node containing fat from the mid portion of the common iliac distally, the retroperitoneal duodenum superiorally, the genitofemoral nerve laterally and the aorta medially.  Hemostasis was confirmed and was reinforced with Floseal.  This dissection was also quite challenging given the retroperitoneal fibrosis and number of small perforating vessels. ? ?The left para-aortic space was opened but in the setting of significant fibrosis and no obvious adenopathy noted (also negative imaging prior to surgery ) decision was made to abandon attempts at  left  para-aortic lymph node removal.  ? ?Irrigation was used and excellent hemostasis was achieved.  Floseal was also placed within the beds of the pelvic lymph node dissections.   ? ?Cystoscopy was performed after bladder was instilled with 200 cc of sterile fluid.  Foley catheter was removed.  Findings at the time of cystoscopy are as noted above. ? ?Gloves were then changed.  At this point in the procedure was completed.  Robotic instruments were removed under direct visulaization.  The robot was undocked. The fascia at the 10-12 mm port was closed with 0 Vicryl on a UR-5 needle.  The subcuticular tissue was closed with 4-0 Vicryl and the skin was closed with 4-0 Monocryl in a subcuticular manner.  Dermabond was applied.   ? ?The vagina was swabbed with  minimal bleeding noted.  All sponge, lap and needle counts were correct x  3.  ? ?The patient was transferred to the recovery room in stable condition. ? ?Jeral Pinch, MD ? ?

## 2021-11-06 NOTE — Interval H&P Note (Signed)
History and Physical Interval Note: ? ?11/06/2021 ?10:37 AM ? ?Ashley Neal  has presented today for surgery, with the diagnosis of ENDOMETRIAL CANCER.  The various methods of treatment have been discussed with the patient and family. After consideration of risks, benefits and other options for treatment, the patient has consented to  Procedure(s): ?ROBOTIC ASSISTED PELVIC LYMPH NODE DISSECTION WITH MINI-LAPAROTOMY, POSSIBLE PARA-AORTIC LYMPHADENECTOMY, POSSIBLE LAPAROTOMY (N/A) as a surgical intervention.  The patient's history has been reviewed, patient examined, no change in status, stable for surgery.  I have reviewed the patient's chart and labs.  Questions were answered to the patient's satisfaction.   ? ? ?Ashley Neal ? ? ?

## 2021-11-07 ENCOUNTER — Encounter: Payer: PPO | Admitting: Gynecologic Oncology

## 2021-11-07 DIAGNOSIS — C541 Malignant neoplasm of endometrium: Secondary | ICD-10-CM | POA: Diagnosis not present

## 2021-11-07 LAB — CBC
HCT: 33.5 % — ABNORMAL LOW (ref 36.0–46.0)
Hemoglobin: 11.2 g/dL — ABNORMAL LOW (ref 12.0–15.0)
MCH: 31.5 pg (ref 26.0–34.0)
MCHC: 33.4 g/dL (ref 30.0–36.0)
MCV: 94.1 fL (ref 80.0–100.0)
Platelets: 204 10*3/uL (ref 150–400)
RBC: 3.56 MIL/uL — ABNORMAL LOW (ref 3.87–5.11)
RDW: 13.2 % (ref 11.5–15.5)
WBC: 10.1 10*3/uL (ref 4.0–10.5)
nRBC: 0 % (ref 0.0–0.2)

## 2021-11-07 LAB — BASIC METABOLIC PANEL
Anion gap: 6 (ref 5–15)
BUN: 18 mg/dL (ref 8–23)
CO2: 26 mmol/L (ref 22–32)
Calcium: 8.7 mg/dL — ABNORMAL LOW (ref 8.9–10.3)
Chloride: 104 mmol/L (ref 98–111)
Creatinine, Ser: 0.91 mg/dL (ref 0.44–1.00)
GFR, Estimated: >60 mL/min (ref >60)
Glucose, Bld: 128 mg/dL — ABNORMAL HIGH (ref 70–99)
Potassium: 4.3 mmol/L (ref 3.5–5.1)
Sodium: 136 mmol/L (ref 135–145)

## 2021-11-07 LAB — SURGICAL PATHOLOGY

## 2021-11-07 NOTE — Discharge Instructions (Signed)
AFTER SURGERY INSTRUCTIONS ?  ?Return to work: 4-6 weeks if applicable ?  ?Activity: ?1. Be up and out of the bed during the day.  Take a nap if needed.  You may walk up steps but be careful and use the hand rail.  Stair climbing will tire you more than you think, you may need to stop part way and rest.  ?  ?2. No lifting or straining for 6 weeks over 10 pounds. No pushing, pulling, straining for 6 weeks. ?  ?3. No driving for around 1 week(s).  Do not drive if you are taking narcotic pain medicine and make sure that your reaction time has returned.  ?  ?4. You can shower as soon as the next day after surgery. Shower daily.  Use your regular soap and water (not directly on the incision) and pat your incision(s) dry afterwards; don't rub.  No tub baths or submerging your body in water until cleared by your surgeon. If you have the soap that was given to you by pre-surgical testing that was used before surgery, you do not need to use it afterwards because this can irritate your incisions.  ?  ?5. No sexual activity and nothing in the vagina for 8 weeks from original surgery on 10/08/21. ?  ?6. You may experience a small amount of clear drainage from your incisions, which is normal.  If the drainage persists, increases, or changes color please call the office. ?  ?7. Do not use creams, lotions, or ointments such as neosporin on your incisions after surgery until advised by your surgeon because they can cause removal of the dermabond glue on your incisions.   ?  ?8. You may experience vaginal spotting after your original surgery that included a hysterectomy.  The spotting is normal but if you experience heavy bleeding, call our office. ?  ?9. Take Tylenol or ibuprofen first for pain and only use narcotic pain medication for severe pain not relieved by the Tylenol or Ibuprofen.  Monitor your Tylenol intake to a max of 4,000 mg in a 24 hour period. You can alternate these medications after surgery. ?  ?Diet: ?1. Low sodium  Heart Healthy Diet is recommended but you are cleared to resume your normal (before surgery) diet after your procedure. ?  ?2. It is safe to use a laxative, such as Miralax or Colace, if you have difficulty moving your bowels. Plan to continue using Miralax at home daily to prevent constipation.  ?  ?Wound Care: ?1. Keep clean and dry.  Shower daily. ?  ?Reasons to call the Doctor: ?Fever - Oral temperature greater than 100.4 degrees Fahrenheit ?Foul-smelling vaginal discharge ?Difficulty urinating ?Nausea and vomiting ?Increased pain at the site of the incision that is unrelieved with pain medicine. ?Difficulty breathing with or without chest pain ?New calf pain especially if only on one side ?Sudden, continuing increased vaginal bleeding with or without clots. ?  ?Contacts: ?For questions or concerns you should contact: ?  ?Dr. Jeral Pinch at [phone] ?  ?Joylene John, NP at [phone] ?  ?After Hours: call [phone] and have the GYN Oncologist paged/contacted (after 5 pm or on the weekends). ?  ?Messages sent via mychart are for non-urgent matters and are not responded to after hours so for urgent needs, please call the after hours number. ?

## 2021-11-07 NOTE — Progress Notes (Signed)
1 Day Post-Op Procedure(s) (LRB): ?ROBOTIC ASSISTED PELVIC LYMPH NODE DISSECTION,  PARA-AORTIC LYMPHADENECTOMY,  CYSTOSCOPY (N/A) ? ?Subjective: ?Patient reports doing well, pain controlled with as needed medications.  Has ambulated in the room and to the bathroom.  Denies nausea or emesis, tolerating p.o. intake. + Flatus.  ? ?Objective: ?Vital signs in last 24 hours: ?Temp:  [97.5 ?F (36.4 ?C)-98.5 ?F (36.9 ?C)] 97.9 ?F (36.6 ?C) (03/30 0520) ?Pulse Rate:  [59-92] 80 (03/30 0520) ?Resp:  [3-17] 16 (03/30 0520) ?BP: (132-176)/(66-97) 138/66 (03/30 0520) ?SpO2:  [92 %-100 %] 98 % (03/30 0520) ?Last BM Date : 11/05/21 ? ?Intake/Output from previous day: ?03/29 0701 - 03/30 0700 ?In: 1980 [P.O.:480; I.V.:1400; IV Piggyback:100] ?Out: 675 [Urine:600; Blood:75] ? ?Physical Examination: ?General: No acute distress, alert and oriented ?Cardiovascular: Regular rate and rhythm, no murmurs or gallops ?Pulmonary: Lungs clear to auscultation bilaterally, no wheezes or rhonchi ?Abdomen: Normal bowel sounds, abdomen is soft, mildly distended, appropriately tender to palpation.  Incisions are clean, dry, and intact with Dermabond in place ?Extremities: Warm and well perfused, SCDs in place, no calf tenderness or edema ? ?Labs: ?WBC/Hgb/Hct/Plts:  10.1/11.2/33.5/204 (03/30 0093) BUN/Cr/glu/ALT/AST/amyl/lip:  18/0.91/--/--/--/--/-- (03/30 0458) ? ? ?Assessment: ? ?76 y.o. s/p Procedure(s): ?ROBOTIC ASSISTED PELVIC LYMPH NODE DISSECTION,  PARA-AORTIC LYMPHADENECTOMY,  CYSTOSCOPY: Stable, meeting milestones ? ?Postop: Patient is meeting milestones.  Pain well controlled this morning.  Hemoglobin decreased appropriate secondary to IV fluids received during surgery and surgical blood loss.  No objective evidence (vital signs, urine output, or labs) that the patient had any significant bleeding after surgery.  She has had return of bowel function, is voiding freely, ambulating, and tolerating p.o. ? ?Discussed in detail with her  findings at the time of surgery related to significant retroperitoneal fibrosis.  We will have a phone visit once her pathology results are back. ? ?Plan: ?Plan for discharge this morning ?The patient is to be discharged to home. ? ? LOS: 1 day  ? ? ?Ashley Neal ?11/07/2021, 12:20 PM ? ? ? ?  ? ?

## 2021-11-07 NOTE — Progress Notes (Signed)
Discharge instructions given to patient and all questions were answered.  

## 2021-11-07 NOTE — Discharge Summary (Signed)
?Physician Discharge Summary  ?Patient ID: ?Ashley Neal ?MRN: [id number] ?DOB/AGE: 01-11-1946 76 y.o. ? ?Admit date: 11/06/2021 ?Discharge date: 11/07/2021 ? ?Admission Diagnoses: Endometrial cancer ([place]) ? ?Discharge Diagnoses:  ?Principal Problem: ?  Endometrial cancer ([place]) ? ? ?Discharged Condition:  The patient is in good condition and stable for discharge.   ? ?Hospital Course: On 11/06/2021, the patient underwent the following: Procedure(s): ROBOTIC ASSISTED PELVIC LYMPH NODE DISSECTION, PARA-AORTIC LYMPHADENECTOMY, CYSTOSCOPY. Due to retroperitoneal fibrosis, complexity of surgery, and concern for risk of bleeding, the patient was kept overnight and monitored with am labs. The postoperative course was uneventful.  She was discharged to home on postoperative day 1 tolerating a regular diet, ambulating, voiding, pain controlled.  ? ?Consults: None   ? ?Significant Diagnostic Studies: Labs ? ?Treatments: surgery: see above ? ?Discharge Exam (performed by Dr. Berline Lopes): ?Blood pressure 138/66, pulse 80, temperature 97.9 ?F (36.6 ?C), temperature source Oral, resp. rate 16, height 5' 0.75" (1.543 m), weight 138 lb 14.2 oz (63 kg), SpO2 98 %. ?General appearance: alert, cooperative, and no distress ?Resp: clear to auscultation bilaterally ?Cardio: regular rate and rhythm, S1, S2 normal, no murmur, click, rub or gallop ?GI: soft, non-tender; bowel sounds normal; no masses,  no organomegaly ?Extremities: extremities normal, atraumatic, no cyanosis or edema ?Incision/Wound: Lap sites to the abdomen intact with dermabond intact  ? ?Disposition: Discharge disposition: 01-Home or Self Care ? ? ? ? ? ? ?Discharge Instructions   ? ? Call MD for:  difficulty breathing, headache or visual disturbances   Complete by: As directed ?  ? Call MD for:  extreme fatigue   Complete by: As directed ?  ? Call MD for:  hives   Complete by: As directed ?  ? Call MD for:  persistant dizziness or light-headedness   Complete by: As  directed ?  ? Call MD for:  persistant nausea and vomiting   Complete by: As directed ?  ? Call MD for:  redness, tenderness, or signs of infection (pain, swelling, redness, odor or green/yellow discharge around incision site)   Complete by: As directed ?  ? Call MD for:  severe uncontrolled pain   Complete by: As directed ?  ? Call MD for:  temperature >100.4   Complete by: As directed ?  ? Diet - low sodium heart healthy   Complete by: As directed ?  ? Driving Restrictions   Complete by: As directed ?  ? No driving for around 1 week.  Do not take narcotics and drive. You need to make sure your reaction time has returned.  ? Increase activity slowly   Complete by: As directed ?  ? Lifting restrictions   Complete by: As directed ?  ? No lifting greater than 10 lbs.  ? Sexual Activity Restrictions   Complete by: As directed ?  ? No sexual activity, nothing in the vagina, for 8 weeks from original surgery.  ? ?  ? ?Allergies as of 11/07/2021   ? ?   Reactions  ? Codeine Other (See Comments)  ? Nausea   ? Tape Other (See Comments)  ? Certain tapes pull skin off  ? ?  ? ?  ?Medication List  ?  ? ?TAKE these medications   ? ?acetaminophen 500 MG tablet ?Commonly known as: TYLENOL ?Take 1,000 mg by mouth every 6 (six) hours as needed for mild pain. ?  ?ALPRAZolam 0.25 MG tablet ?Commonly known as: Duanne Moron ?Take 0.25 mg by mouth 2 (two) times  daily as needed for anxiety. ?  ?aspirin EC 81 MG tablet ?Take 81 mg by mouth daily. ?  ?carbidopa-levodopa 25-100 MG tablet ?Commonly known as: SINEMET IR ?TAKE 1 TABLET BY MOUTH FOUR TIMES A DAY ?What changed:  ?how much to take ?how to take this ?when to take this ?  ?ibuprofen 200 MG tablet ?Commonly known as: ADVIL ?Take 200-400 mg by mouth every 6 (six) hours as needed (alternate with tylenol for pain). ?  ?lisinopril 10 MG tablet ?Commonly known as: ZESTRIL ?Take 1 tablet (10 mg total) by mouth daily. May take extra 5 mg (half tablet) daily if BP over 160 ?  ?multivitamin  tablet ?Take 1 tablet by mouth daily. ?  ?oxyCODONE 5 MG immediate release tablet ?Commonly known as: Oxy IR/ROXICODONE ?Take 1 tablet (5 mg total) by mouth every 6 (six) hours as needed for severe pain. For AFTER surgery only, do not take and drive ?  ?polyethylene glycol powder 17 GM/SCOOP powder ?Commonly known as: GLYCOLAX/MIRALAX ?Take 17 g by mouth every other day. ?  ? ?  ? ? Follow-up Information   ? ? Lafonda Mosses, MD Follow up on 11/13/2021.   ?Specialty: Gynecologic Oncology ?Why: at 4pm will be a PHONE visit to discuss pathology and check in with Dr. Berline Lopes. IN P[facility]. ?Contact information: ?[address] ?[place] [address] ?[phone] ? ? ?  ?  ? ?  ?  ? ?  ? ? ?Greater than thirty minutes were spend for face to face discharge instructions and discharge orders/summary in EPIC.  ? ?Signed: ?Josaphine Shimamoto D Kassidie Hendriks ?11/07/2021, 3:47 PM ? ? ? ?  ?

## 2021-11-08 ENCOUNTER — Telehealth: Payer: Self-pay | Admitting: *Deleted

## 2021-11-08 NOTE — Telephone Encounter (Signed)
Spoke with Ashley Neal this morning. She states she is eating, drinking and urinating well. She has not had a BM yet and is not passing gas but she denies gas pain or abdominal bloating. She stated this is usual for her. She is taking senokot and miralax OTC as needed and encouraged her to drink plenty of water. She denies fever or chills. Incisions are dry and intact. She rates her pain 5/10. Her pain is controlled with tylenol and ibuprofen.   ? ?Instructed to call office with any fever, chills, purulent drainage, uncontrolled pain or any other questions or concerns. Patient verbalizes understanding.  ? ?Pt aware of post op appointments as well as the office number [phone] and after hours number [phone] to call if she has any questions or concerns  ?

## 2021-11-13 ENCOUNTER — Inpatient Hospital Stay: Payer: PPO | Attending: Gynecologic Oncology | Admitting: Gynecologic Oncology

## 2021-11-13 ENCOUNTER — Encounter: Payer: Self-pay | Admitting: Gynecologic Oncology

## 2021-11-13 DIAGNOSIS — C541 Malignant neoplasm of endometrium: Secondary | ICD-10-CM

## 2021-11-13 DIAGNOSIS — Z7189 Other specified counseling: Secondary | ICD-10-CM

## 2021-11-13 NOTE — Progress Notes (Signed)
Gynecologic Oncology Telehealth Consult Note: Gyn-Onc ? ?I connected with Ashley Neal on 11/13/21 at  4:00 PM EDT by telephone and verified that I am speaking with the correct person using two identifiers. ? ?I discussed the limitations, risks, security and privacy concerns of performing an evaluation and management service by telemedicine and the availability of in-person appointments. I also discussed with the patient that there may be a patient responsible charge related to this service. The patient expressed understanding and agreed to proceed. ? ?Other persons participating in the visit and their role in the encounter: none. ? ?Patient's location: home ?Provider's location: [facility] ? ?Reason for Visit: follow-up after surgery, treatment discussion ? ?Treatment History: ?Oncology History Overview Note  ?MMR IHC intact ?MS stable ?  ?Endometrial cancer ([place])  ?08/31/2021 Imaging  ? MRI of the pelvis performed for low back pain on 08/31/2021 with incidental finding of endometrial thickening up to 1 cm, suboptimally evaluated.  There is no pelvic free fluid.  No adenopathy.  2.5 cm right ovarian cyst noted.  Other MRI findings: no hip fracture, dislocation or avascular necrosis.  There is mild osteoarthritis of bilateral hips.  There is a large high-grade partial-thickness tear of the left hamstring origin.  Partial-thickness tear of the left gluteus medius tendon insertion.  Mild osteoarthritis of bilateral SI joints also noted. ?  ?09/04/2021 Imaging  ? Pelvic ultrasound exam on 09/04/2021 shows a uterus measuring 6.4 x 3.1 x 4.2 cm with an endometrial lining of 1.1 cm with fluid.  Irregular appearance of the right ovary with simple follicle measuring up to 5 mm, avascular.  Simple cyst adjacent to the right ovary measures up to 2.7 cm and also avascular.  Left ovary noted to be normal. ?  ?09/12/2021 Initial Biopsy  ? EMB: showed rare atypical glands, predominantly mucus with few admixed benign endocervical glands.   Although the rare glandular fragments with cribriform pattern and mild cytologic atypia are extremely scant, they are concerning for at least endometrial hyperplasia. ?  ?10/08/2021 Surgery  ? TRH/BSO, SLN injection with no mapping bilaterally, cystoscopy ?No residual hyperplasia seen on frozen, no malignancy ?  ?Findings:  On EUA, small mobile uterus. Normal upper abdominal survey. Normal appearing omentum and small and large bowel. Normal appendix. Uterus 8cm and normal in appearance with small fundal fibroid. Normal bilateral adnexa with evidence of prior BTL. ICG seen along posterior cervix, mapping not appreciated in either pelvic LN basin. No obvious adenopathy. Given some difficulty with the uterine manipulator and trouble initially identifying plane between bladder and LUS and cervix, cystoscopy was performed. Bladder dome intact, good efflux noted from bilateral ureteral orifices.  ?  ?10/08/2021 Pathology Results  ? A. UTERUS, CERVIX, BILATERAL FALLOPIAN TUBES AND OVARIES:  ?Invasive well differentiated mucinous endometrial adenocarcinoma, FIGO 1  ?Tumor invades greater than 50% of the myometrium (11 mm /18 mm)  ?Tumor arises within complex atypical mucinous hyperplasia involving a  ?polyp and the anterior and posterior endometrium  ?Adenomyosis  ?Background inactive endometrium with cystic change  ?Benign serous cyst papillary adenofibroma of right ovary  ?Chronic cervicitis with nabothian cysts  ?Benign left ovary and right and left fallopian tubes  ? ?ONCOLOGY TABLE:  ? ?UTERUS, CARCINOMA OR CARCINOSARCOMA: Resection  ? ?Procedure: Total hysterectomy and bilateral salpingo-oophorectomy  ?Histologic Type: Mucinous endometrial adenocarcinoma  ?Histologic Grade: Well differentiated, FIGO 1  ?Myometrial Invasion: Present  ?     Depth of Myometrial Invasion (mm): 11 mm  ?     Myometrial  Thickness (mm): 18 mm  ?     Percentage of Myometrial Invasion: 61%  ?Uterine Serosa Involvement: Not identified  ?Cervical  stromal Involvement:[Not identified  ?Extent of involvement of other tissue/organs: Not identified  ?Peritoneal/Ascitic Fluid: Not submitted  ?Lymphovascular Invasion: Not identified  ?Regional Lymph Nodes: Not applicable (no lymph nodes submitted or found)  ? ?Distant Metastasis: Not applicable  ?Pathologic Stage Classification (pTNM, AJCC 8th Edition): pT1b, pN n/a  ?Ancillary Studies: MMR / MSI testing will be ordered  ?Representative Tumor Block: A5, A6  ?Comment(s): None  ?  ?10/16/2021 Initial Diagnosis  ? Endometrial cancer [facility]) ?  ?11/06/2021 Surgery  ? Robotic-assisted bilateral pelvic lymph node dissection, right para-aortic lymph node dissection, cystoscopy ? ?Findings: On EUA, well-healed vaginal cuff.  Normal upper abdominal survey.  Normal-appearing omentum, small and large bowel.  Some filmy adhesions of the sigmoid epiploica to the left pelvic sidewall.  Significant fibrosis noted of bilateral pelvic sidewalls and the retroperitoneal spaces.  Nodular tissue along some areas recent surgical dissection, presumed to be inflammatory from surgery.  No obvious adenopathy.  Fibrosis made pelvic lymph node dissection very challenging with significant adherence of minimal lymphatic tissue to the right external iliac vein.  Anatomy quite challenging to delineate in the setting of this fibrosis as well as recent surgery.  Along the para-aortic lymph node beds, no significant tissue noted along the left aspect and due to retroperitoneal fibrosis, further dissection did not seem safe.  Along the right, what was presumed to be the IVC was displaced laterally.  Cystoscopy, bladder dome noted to be intact, good efflux noted from bilateral ureteral orifices. ?  ?11/06/2021 Pathology Results  ? A. LYMPH NODE, LEFT PELVIC, EXCISION:  ?- Four lymph nodes, negative for carcinoma (0/4)  ? ?B. PERITONEUM, BIOPSY:  ?- Peritonealized soft tissue with focal fat necrosis  ?- Negative for carcinoma  ? ?C. LYMPH NODE, RIGHT PELVIC,  EXCISION:  ?- Five lymph nodes, negative for carcinoma (0/5)  ? ?D. ROUND LIGAMENT REMNANT, EXCISION:  ?- Negative for carcinoma  ? ?E. PARA-AORTIC, RIGHT, EXCISION:  ?- Lymph node, negative for carcinoma (0 ?  ? ? ?Interval History: ?She feels a little worse than after first surgery. ?Soreness, improving daily. Not using oxycodone. Using Tylenol and ibuprofen prn. ?Was constipated initially due to oxycodone. Now going every other day, using Mirilax. ?Denies urinary symptoms. ?No nausea or emesis.  ?Some hot flashes started last Friday. ? ?Past Medical/Surgical History: ?Past Medical History:  ?Diagnosis Date  ? Anxiety   ? Depression   ? GERD (gastroesophageal reflux disease)   ? Hypertension   ? Neuromuscular disorder ([place])   ? Osteoarthritis   ? Osteopenia   ? Parkinson disease ([place])   ? Pneumonia   ? Tremors of nervous system   ? legs  ? uterine ca 10/08/2021  ? also endometrial cancer  ? ? ?Past Surgical History:  ?Procedure Laterality Date  ? CARPAL TUNNEL RELEASE Bilateral 1996/1997  ? CYSTOSCOPY N/A 10/08/2021  ? Procedure: CYSTOSCOPY;  Surgeon: Lafonda Mosses, MD;  Location: [facility];  Service: Gynecology;  Laterality: N/A;  ? ROBOTIC ASSISTED TOTAL HYSTERECTOMY WITH BILATERAL SALPINGO OOPHERECTOMY N/A 10/08/2021  ? Procedure: XI ROBOTIC ASSISTED TOTAL HYSTERECTOMY WITH BILATERAL SALPINGO OOPHORECTOMY;SENTINEL LYMPH NODE INJECTION;  Surgeon: Lafonda Mosses, MD;  Location: [facility];  Service: Gynecology;  Laterality: N/A;  ? SQUAMOUS CELL CARCINOMA EXCISION Left 2017  ? Left jawline  ? TUBAL LIGATION  1980's  ? ? ?Family History  ?  Problem Relation Age of Onset  ? Endometrial cancer Mother   ? Diabetes Mother   ? Alzheimer's disease Father   ? Coronary artery disease Father   ? Diabetes Sister   ? COPD Sister   ? Diabetes Sister   ? Heart disease Brother   ? Other Brother   ?     MVA   ? Parkinson's disease Brother   ? Heart disease Brother   ? Alcohol abuse Brother   ? Heart disease Brother   ?  Parkinson's disease Maternal Aunt   ? Breast cancer Maternal Aunt   ? Colon cancer Paternal Aunt   ? Lung cancer Paternal Uncle   ? Healthy Son   ? Healthy Son   ? Prostate cancer Neg Hx   ? Pancreatic cancer

## 2021-11-14 ENCOUNTER — Encounter: Payer: Self-pay | Admitting: Oncology

## 2021-11-14 ENCOUNTER — Telehealth: Payer: Self-pay | Admitting: Radiation Oncology

## 2021-11-14 DIAGNOSIS — C541 Malignant neoplasm of endometrium: Secondary | ICD-10-CM

## 2021-11-14 NOTE — Progress Notes (Signed)
Referral placed to Dr. Sondra Come for vaginal brachytherapy per Dr. Berline Lopes. ?

## 2021-11-14 NOTE — Telephone Encounter (Signed)
LVM on home # to schedule consult with Dr. Sondra Come ?

## 2021-11-15 NOTE — Progress Notes (Signed)
GYN Location of Tumor / Histology: Endometrial ? ?Algie Coffer presented with symptoms of: pelvic pain became back pain and localized to her sacrum.  She has had an ultrasound, MRI, injections in her back, and dry needling.  She has struggled to find relief related to her pain, has tried a number of medications without success.  Most recently, she was given oxycodone which has helped provide significant relief. ? After her MRI showed an incidental finding of thickened endometrium, she underwent pelvic ultrasound and endometrial biopsy.  Endometrial biopsy showed rare glandular fragments with features concerning for at least endometrial hyperplasia. ? ?Biopsies revealed:  ?09/12/2021 Initial Biopsy  ?  EMB: showed rare atypical glands, predominantly mucus with few admixed benign endocervical glands.  Although the rare glandular fragments with cribriform pattern and mild cytologic atypia are extremely scant, they are concerning for at least endometrial hyperplasia.  ? ?10/08/2021 Pathology Results  ?  A. UTERUS, CERVIX, BILATERAL FALLOPIAN TUBES AND OVARIES:  ?Invasive well differentiated mucinous endometrial adenocarcinoma, FIGO 1  ?Tumor invades greater than 50% of the myometrium (11 mm /18 mm)  ?Tumor arises within complex atypical mucinous hyperplasia involving a  ?polyp and the anterior and posterior endometrium  ?Adenomyosis  ?Background inactive endometrium with cystic change  ?Benign serous cyst papillary adenofibroma of right ovary  ?Chronic cervicitis with nabothian cysts  ?Benign left ovary and right and left fallopian tubes    ? ? ?Past/Anticipated interventions by Gyn/Onc surgery, if any:  ?10/08/2021 Surgery  ?  TRH/BSO, SLN injection with no mapping bilaterally, cystoscopy ?No residual hyperplasia seen on frozen, no malignancy  ? ?2/29/2023 ?Operation: Robotic-assisted bilateral pelvic lymph node dissection, right para-aortic lymph node dissection, cystoscopy  ?Surgeon: Jeral Pinch  MD ? ?Past/Anticipated interventions by medical oncology, if any: none at this time ? ?Weight changes, if any: yes, 7 pound weight gain in 1 month ? ?Bowel/Bladder complaints, if any:  constipation, nocturia x1-2, frequency ? ?Nausea/Vomiting, if any: occasional nausea with stress ? ?Pain issues, if any:  low back pain ? ?SAFETY ISSUES: ?Prior radiation? no ?Pacemaker/ICD? no ?Possible current pregnancy? no, postmenopausal, hysterectomy ?Is the patient on methotrexate? no ? ?Current Complaints / other details:  fatigue ? ?Vitals:  ? 11/20/21 1012  ?BP: (!) 147/72  ?Pulse: 83  ?Resp: 20  ?Temp: 97.6 ?F (36.4 ?C)  ?SpO2: 100%  ?Weight: 142 lb 3.2 oz (64.5 kg)  ?Height: 5' (1.524 m)  ? ? ? ?

## 2021-11-18 ENCOUNTER — Encounter: Payer: Self-pay | Admitting: Oncology

## 2021-11-18 ENCOUNTER — Other Ambulatory Visit: Payer: Self-pay | Admitting: Oncology

## 2021-11-18 NOTE — Progress Notes (Signed)
Gynecologic Oncology Multi-Disciplinary Disposition Conference Note ? ?Date of the Conference: 11/18/2021 ? ?Patient Name: Ashley Neal  ?Referring Provider: Burman Riis, NP ?Primary GYN Oncologist: Dr. Berline Lopes ? ?Stage/Disposition:  Stage IB, grade 1 mucinous endometrial adenocarcinoma. Disposition is to vaginal brachytherapy.  ? ?This Multidisciplinary conference took place involving physicians from [facility], Medical Oncology, Radiation Oncology, Pathology, Radiology along with the Gynecologic Oncology Nurse Practitioner and Gynecologic Oncology Nurse Navigator.  Comprehensive assessment of the patient's malignancy, staging, need for surgery, chemotherapy, radiation therapy, and need for further testing were reviewed. Supportive measures, both inpatient and following discharge were also discussed. The recommended plan of care is documented. Greater than 35 minutes were spent correlating and coordinating this patient's care.  ?

## 2021-11-19 ENCOUNTER — Telehealth: Payer: Self-pay | Admitting: Oncology

## 2021-11-19 DIAGNOSIS — F418 Other specified anxiety disorders: Secondary | ICD-10-CM | POA: Diagnosis not present

## 2021-11-19 DIAGNOSIS — M533 Sacrococcygeal disorders, not elsewhere classified: Secondary | ICD-10-CM | POA: Diagnosis not present

## 2021-11-19 DIAGNOSIS — Z90710 Acquired absence of both cervix and uterus: Secondary | ICD-10-CM | POA: Diagnosis not present

## 2021-11-19 DIAGNOSIS — G20 Parkinson's disease: Secondary | ICD-10-CM | POA: Diagnosis not present

## 2021-11-19 DIAGNOSIS — C541 Malignant neoplasm of endometrium: Secondary | ICD-10-CM | POA: Diagnosis not present

## 2021-11-19 NOTE — Progress Notes (Signed)
?Radiation Oncology         [phone] ?________________________________ ? ?Initial Outpatient Consultation ? ?Name: Ashley Neal MRN: [id number]  ?Date: 11/20/2021  DOB: [birth year] ? ?TF:TDDUKGUR, Colletta [place], NP  Lafonda Mosses, MD  ? ?REFERRING PHYSICIAN: Lafonda Mosses, MD ? ?DIAGNOSIS: The encounter diagnosis was Endometrial cancer ([place]). ? ? stage IB mucinous endometrial adenocarcinoma, FIGO grade 1 ? ?HISTORY OF PRESENT ILLNESS::Ashley Neal is a 76 y.o. female who is accompanied by a good friend. she is seen as a courtesy of Dr. Berline Lopes for an opinion concerning radiation therapy as part of management for her recently diagnosed endometrial cancer. The patient reports onset of pelvic pain beginning in August of 2022. Her pain started around the same time that she dislocated her hip in August. Despite multiple medications and therapies, her pain progressed and eventually localized to her sacrum.  ? ?MRI performed for evaluation of her ongoing pelvic pain on 08/31/21 incidentally revealed a relatively thickened endometrium measuring 10 mm. MRI also showed mild osteoarthritis of bilateral hips, a large high-grade partial-thickness tear of the left hamstring, a partial-thickness tear of the left gluteus medius tendon insertion, and mild osteoarthritis of the bilateral SI joints. MRI of the lumbar spine also performed on this date revealed mild foraminal narrowing at L3-L4, and L5-S1. ? ?Subsequently, the patient met with her OBGYN who performed an endometrial biopsy on 09/12/21 which revealed rare atypical glands. Microscopic comment noted these findings as concerning for at least endometrial hyperplasia (EIN). ? ?Accordingly, the patient was referred to Dr. Berline Lopes on 09/27/21 for further evaluation. During which time, the patient denied any vaginal bleeding or discharge with the exception of several days of spotting after her biopsy.  She also reported that her appetite had been up and down  secondary to pain. She otherwise denied any urinary symptoms. Pelvic exam performed during this visit was unremarkable.  ? ?Following discussion, the patient opted to proceed with hysterectomy with BSO on 10/08/21 under the care of Dr. Berline Lopes. Pathology from the procedure revealed: FIGO grade 1 invasive well differentiated mucinous endometrial adenocarcinoma (arises within complex atypical mucinous hyperplasia involving a  polyp and the anterior and posterior endometrium adenomyosis). Tumor noted with greater than 50% myometrial invasion (11 mm / 18 mm). Bilateral fallopian tubes and ovaries were otherwise benign (a benign serous cyst papillary adenofibroma of right ovary was however noted).  ?-- Molecular pathology revealed MSI stable and MMR normal.  ? ?Given increased risk of lymph node metastases with mucinous endometrial adenocarcinoma, Dr. Berline Lopes recommended proceeding with a CT scan and lymphadenectomy.  ? ?CT of the abdomen and pelvis on 10/29/21 showed no acute findings in the abdomen or pelvis and no evidence of metastatic disease.  ? ?She proceeded to undergo lymph node dissections and para-aortic lymphadenectomy on 11/06/21 which showed no evidence of carcinoma in 10/10 lymph nodes. Peritoneum and round ligament biopsies was also collected which were negative for carcinoma.  ? ?Per her most recent follow-up with Dr. Berline Lopes on 11/13/21, the patient was noted to be feeling a little worse that after her first surgery, mainly due to soreness. She otherwise denied any GU symptoms. Given her negative lymph node status, Dr. Berline Lopes recommended proceeding with vaginal brachytherapy and referred the patient to me for consideration.  ? ?Of note: the patient's mother had metastatic endometrial cancer which she passed away from ? ?PREVIOUS RADIATION THERAPY: No ? ?PAST MEDICAL HISTORY:  history of ulcerative colitis and Crohn's disease, also significant history of Parkinson's  disease ?Past Medical History:   ?Diagnosis Date  ? Anxiety   ? Depression   ? GERD (gastroesophageal reflux disease)   ? Hypertension   ? Neuromuscular disorder ([place])   ? Osteoarthritis   ? Osteopenia   ? Parkinson disease ([place])   ? Pneumonia   ? Tremors of nervous system   ? legs  ? uterine ca 10/08/2021  ? also endometrial cancer  ? ? ?PAST SURGICAL HISTORY: ?Past Surgical History:  ?Procedure Laterality Date  ? CARPAL TUNNEL RELEASE Bilateral 1996/1997  ? CYSTOSCOPY N/A 10/08/2021  ? Procedure: CYSTOSCOPY;  Surgeon: Lafonda Mosses, MD;  Location: [facility];  Service: Gynecology;  Laterality: N/A;  ? ROBOTIC ASSISTED TOTAL HYSTERECTOMY WITH BILATERAL SALPINGO OOPHERECTOMY N/A 10/08/2021  ? Procedure: XI ROBOTIC ASSISTED TOTAL HYSTERECTOMY WITH BILATERAL SALPINGO OOPHORECTOMY;SENTINEL LYMPH NODE INJECTION;  Surgeon: Lafonda Mosses, MD;  Location: [facility];  Service: Gynecology;  Laterality: N/A;  ? SQUAMOUS CELL CARCINOMA EXCISION Left 2017  ? Left jawline  ? TUBAL LIGATION  1980's  ? ? ?FAMILY HISTORY:  ?Family History  ?Problem Relation Age of Onset  ? Endometrial cancer Mother   ? Diabetes Mother   ? Alzheimer's disease Father   ? Coronary artery disease Father   ? Diabetes Sister   ? COPD Sister   ? Diabetes Sister   ? Heart disease Brother   ? Other Brother   ?     MVA   ? Parkinson's disease Brother   ? Heart disease Brother   ? Alcohol abuse Brother   ? Heart disease Brother   ? Parkinson's disease Maternal Aunt   ? Breast cancer Maternal Aunt   ? Colon cancer Paternal Aunt   ? Lung cancer Paternal Uncle   ? Healthy Son   ? Healthy Son   ? Prostate cancer Neg Hx   ? Pancreatic cancer Neg Hx   ? Ovarian cancer Neg Hx   ? ? ?SOCIAL HISTORY: Recently married approximately 2 to 3 weeks ago ?Social History  ? ?Tobacco Use  ? Smoking status: Never  ? Smokeless tobacco: Never  ?Vaping Use  ? Vaping Use: Never used  ?Substance Use Topics  ? Alcohol use: Yes  ?  Comment: ocasiona;  ? Drug use: Not Currently  ? ? ?ALLERGIES:  ?Allergies   ?Allergen Reactions  ? Codeine Other (See Comments)  ?  Nausea   ? Tape Other (See Comments)  ?  Certain tapes pull skin off  ? ? ?MEDICATIONS:  ?Current Outpatient Medications  ?Medication Sig Dispense Refill  ? acetaminophen (TYLENOL) 500 MG tablet Take 1,000 mg by mouth every 6 (six) hours as needed for mild pain.    ? ALPRAZolam (XANAX) 0.25 MG tablet Take 0.25 mg by mouth 2 (two) times daily as needed for anxiety.    ? carbidopa-levodopa (SINEMET IR) 25-100 MG tablet TAKE 1 TABLET BY MOUTH FOUR TIMES A DAY (Patient taking differently: Take 1 tablet by mouth 4 (four) times daily.) 360 tablet 3  ? ibuprofen (ADVIL) 200 MG tablet Take 200-400 mg by mouth every 6 (six) hours as needed (alternate with tylenol for pain).    ? lisinopril (ZESTRIL) 10 MG tablet Take 1 tablet (10 mg total) by mouth daily. May take extra 5 mg (half tablet) daily if BP over 160 140 tablet 3  ? Multiple Vitamin (MULTIVITAMIN) tablet Take 1 tablet by mouth daily.    ? oxyCODONE (OXY IR/ROXICODONE) 5 MG immediate release tablet Take 1 tablet (5 mg  total) by mouth every 6 (six) hours as needed for severe pain. For AFTER surgery only, do not take and drive 15 tablet 0  ? polyethylene glycol powder (GLYCOLAX/MIRALAX) 17 GM/SCOOP powder Take 17 g by mouth every other day.    ? aspirin EC 81 MG tablet Take 81 mg by mouth daily. (Patient not taking: Reported on 10/22/2021)    ? nystatin (MYCOSTATIN) 100000 UNIT/ML suspension SMARTSIG:4 Milliliter(s) By Mouth 4 Times Daily (Patient not taking: Reported on 11/20/2021)    ? ?No current facility-administered medications for this encounter.  ? ? ?REVIEW OF SYSTEMS:  A 10+ POINT REVIEW OF SYSTEMS WAS OBTAINED including neurology, dermatology, psychiatry, cardiac, respiratory, lymph, extremities, GI, GU, musculoskeletal, constitutional, reproductive, HEENT.  She denies any vaginal bleeding or discharge.  She reports her parkinsonian symptoms have worsened over the past few weeks related to the recent  events. ?  ?PHYSICAL EXAM:  height is 5' (1.524 m) and weight is 142 lb 3.2 oz (64.5 kg). Her temperature is 97.6 ?F (36.4 ?C). Her blood pressure is 147/72 (abnormal) and her pulse is 83. Her respiration is 20 and oxygen satu

## 2021-11-19 NOTE — Telephone Encounter (Signed)
Called Kazuko regarding her questions about brachytherapy.  First reviewed her stage and diagnosis and that brachytherapy is being recommended to reduce the risk for local recurrence.  Discussed what to expect with treatment and possible side effects.  Gave her my contact information and encouraged her to call with any questions. ?

## 2021-11-20 ENCOUNTER — Ambulatory Visit
Admission: RE | Admit: 2021-11-20 | Discharge: 2021-11-20 | Disposition: A | Discharge status: Home / Self Care | Payer: PPO | Source: Ambulatory Visit | Attending: Radiation Oncology | Admitting: Radiation Oncology

## 2021-11-20 ENCOUNTER — Other Ambulatory Visit: Payer: Self-pay

## 2021-11-20 ENCOUNTER — Encounter: Payer: Self-pay | Admitting: Radiation Oncology

## 2021-11-20 VITALS — BP 147/72 | HR 83 | Temp 97.6°F | Resp 20 | Ht 60.0 in | Wt 142.2 lb

## 2021-11-20 DIAGNOSIS — C541 Malignant neoplasm of endometrium: Secondary | ICD-10-CM | POA: Diagnosis not present

## 2021-11-20 DIAGNOSIS — M8580 Other specified disorders of bone density and structure, unspecified site: Secondary | ICD-10-CM | POA: Diagnosis not present

## 2021-11-20 DIAGNOSIS — Z7982 Long term (current) use of aspirin: Secondary | ICD-10-CM | POA: Diagnosis not present

## 2021-11-20 DIAGNOSIS — M1990 Unspecified osteoarthritis, unspecified site: Secondary | ICD-10-CM | POA: Insufficient documentation

## 2021-11-20 DIAGNOSIS — G20 Parkinson's disease: Secondary | ICD-10-CM | POA: Diagnosis not present

## 2021-11-20 DIAGNOSIS — F4323 Adjustment disorder with mixed anxiety and depressed mood: Secondary | ICD-10-CM | POA: Diagnosis not present

## 2021-11-20 DIAGNOSIS — Z79899 Other long term (current) drug therapy: Secondary | ICD-10-CM | POA: Insufficient documentation

## 2021-11-20 DIAGNOSIS — I10 Essential (primary) hypertension: Secondary | ICD-10-CM | POA: Diagnosis not present

## 2021-11-20 DIAGNOSIS — Z808 Family history of malignant neoplasm of other organs or systems: Secondary | ICD-10-CM | POA: Insufficient documentation

## 2021-11-20 DIAGNOSIS — K8020 Calculus of gallbladder without cholecystitis without obstruction: Secondary | ICD-10-CM | POA: Diagnosis not present

## 2021-11-20 NOTE — Progress Notes (Signed)
See MD note for nursing evaluation. °

## 2021-11-21 ENCOUNTER — Ambulatory Visit: Payer: PPO | Admitting: Physical Therapy

## 2021-11-28 ENCOUNTER — Telehealth: Payer: Self-pay | Admitting: Oncology

## 2021-11-28 DIAGNOSIS — C541 Malignant neoplasm of endometrium: Secondary | ICD-10-CM | POA: Diagnosis not present

## 2021-11-28 DIAGNOSIS — C55 Malignant neoplasm of uterus, part unspecified: Secondary | ICD-10-CM | POA: Diagnosis not present

## 2021-11-28 NOTE — Telephone Encounter (Signed)
Ashley Neal called and said she has been reviewing recommendations at [facility] and the [place] for her stage and diagnosis and they are talking about other treatments besides radiation.  She is wondering who to talk to about this.  Advised her to discuss this with Dr. Berline Lopes at her post op appointment tomorrow. ?

## 2021-11-29 ENCOUNTER — Encounter: Payer: Self-pay | Admitting: Gynecologic Oncology

## 2021-11-29 ENCOUNTER — Other Ambulatory Visit: Payer: Self-pay

## 2021-11-29 ENCOUNTER — Inpatient Hospital Stay (HOSPITAL_BASED_OUTPATIENT_CLINIC_OR_DEPARTMENT_OTHER): Payer: PPO | Admitting: Gynecologic Oncology

## 2021-11-29 VITALS — BP 154/81 | HR 79 | Temp 98.1°F | Resp 20 | Ht 60.0 in | Wt 142.0 lb

## 2021-11-29 DIAGNOSIS — Z90722 Acquired absence of ovaries, bilateral: Secondary | ICD-10-CM

## 2021-11-29 DIAGNOSIS — Z90710 Acquired absence of both cervix and uterus: Secondary | ICD-10-CM

## 2021-11-29 DIAGNOSIS — Z7189 Other specified counseling: Secondary | ICD-10-CM

## 2021-11-29 DIAGNOSIS — C541 Malignant neoplasm of endometrium: Secondary | ICD-10-CM

## 2021-11-29 NOTE — Patient Instructions (Addendum)
You are healing well from surgery.  Remember lifting restrictions until 6 weeks after your second surgery. ? ?I will see you back once you have completed vaginal radiation.  We will tentatively schedule you to come back for a visit in 5 months. ?

## 2021-11-29 NOTE — Progress Notes (Signed)
Gynecologic Oncology Return Clinic Visit ? ?11/29/2021 ? ?Reason for Visit: Follow-up after surgery, treatment discussion ? ?Treatment History: ?Oncology History Overview Note  ?MMR IHC intact ?MS stable ?  ?Endometrial cancer ([place])  ?08/31/2021 Imaging  ? MRI of the pelvis performed for low back pain on 08/31/2021 with incidental finding of endometrial thickening up to 1 cm, suboptimally evaluated.  There is no pelvic free fluid.  No adenopathy.  2.5 cm right ovarian cyst noted.  Other MRI findings: no hip fracture, dislocation or avascular necrosis.  There is mild osteoarthritis of bilateral hips.  There is a large high-grade partial-thickness tear of the left hamstring origin.  Partial-thickness tear of the left gluteus medius tendon insertion.  Mild osteoarthritis of bilateral SI joints also noted. ?  ?09/04/2021 Imaging  ? Pelvic ultrasound exam on 09/04/2021 shows a uterus measuring 6.4 x 3.1 x 4.2 cm with an endometrial lining of 1.1 cm with fluid.  Irregular appearance of the right ovary with simple follicle measuring up to 5 mm, avascular.  Simple cyst adjacent to the right ovary measures up to 2.7 cm and also avascular.  Left ovary noted to be normal. ?  ?09/12/2021 Initial Biopsy  ? EMB: showed rare atypical glands, predominantly mucus with few admixed benign endocervical glands.  Although the rare glandular fragments with cribriform pattern and mild cytologic atypia are extremely scant, they are concerning for at least endometrial hyperplasia. ?  ?10/08/2021 Surgery  ? TRH/BSO, SLN injection with no mapping bilaterally, cystoscopy ?No residual hyperplasia seen on frozen, no malignancy ?  ?Findings:  On EUA, small mobile uterus. Normal upper abdominal survey. Normal appearing omentum and small and large bowel. Normal appendix. Uterus 8cm and normal in appearance with small fundal fibroid. Normal bilateral adnexa with evidence of prior BTL. ICG seen along posterior cervix, mapping not appreciated in either pelvic  LN basin. No obvious adenopathy. Given some difficulty with the uterine manipulator and trouble initially identifying plane between bladder and LUS and cervix, cystoscopy was performed. Bladder dome intact, good efflux noted from bilateral ureteral orifices.  ?  ?10/08/2021 Pathology Results  ? A. UTERUS, CERVIX, BILATERAL FALLOPIAN TUBES AND OVARIES:  ?Invasive well differentiated mucinous endometrial adenocarcinoma, FIGO 1  ?Tumor invades greater than 50% of the myometrium (11 mm /18 mm)  ?Tumor arises within complex atypical mucinous hyperplasia involving a  ?polyp and the anterior and posterior endometrium  ?Adenomyosis  ?Background inactive endometrium with cystic change  ?Benign serous cyst papillary adenofibroma of right ovary  ?Chronic cervicitis with nabothian cysts  ?Benign left ovary and right and left fallopian tubes  ? ?ONCOLOGY TABLE:  ? ?UTERUS, CARCINOMA OR CARCINOSARCOMA: Resection  ? ?Procedure: Total hysterectomy and bilateral salpingo-oophorectomy  ?Histologic Type: Mucinous endometrial adenocarcinoma  ?Histologic Grade: Well differentiated, FIGO 1  ?Myometrial Invasion: Present  ?     Depth of Myometrial Invasion (mm): 11 mm  ?     Myometrial Thickness (mm): 18 mm  ?     Percentage of Myometrial Invasion: 61%  ?Uterine Serosa Involvement: Not identified  ?Cervical stromal Involvement:[Not identified  ?Extent of involvement of other tissue/organs: Not identified  ?Peritoneal/Ascitic Fluid: Not submitted  ?Lymphovascular Invasion: Not identified  ?Regional Lymph Nodes: Not applicable (no lymph nodes submitted or found)  ? ?Distant Metastasis: Not applicable  ?Pathologic Stage Classification (pTNM, AJCC 8th Edition): pT1b, pN n/a  ?Ancillary Studies: MMR / MSI testing will be ordered  ?Representative Tumor Block: A5, A6  ?Comment(s): None  ?  ?10/16/2021 Initial Diagnosis  ?  Endometrial cancer [facility]) ?  ?11/06/2021 Surgery  ? Robotic-assisted bilateral pelvic lymph node dissection, right para-aortic  lymph node dissection, cystoscopy ? ?Findings: On EUA, well-healed vaginal cuff.  Normal upper abdominal survey.  Normal-appearing omentum, small and large bowel.  Some filmy adhesions of the sigmoid epiploica to the left pelvic sidewall.  Significant fibrosis noted of bilateral pelvic sidewalls and the retroperitoneal spaces.  Nodular tissue along some areas recent surgical dissection, presumed to be inflammatory from surgery.  No obvious adenopathy.  Fibrosis made pelvic lymph node dissection very challenging with significant adherence of minimal lymphatic tissue to the right external iliac vein.  Anatomy quite challenging to delineate in the setting of this fibrosis as well as recent surgery.  Along the para-aortic lymph node beds, no significant tissue noted along the left aspect and due to retroperitoneal fibrosis, further dissection did not seem safe.  Along the right, what was presumed to be the IVC was displaced laterally.  Cystoscopy, bladder dome noted to be intact, good efflux noted from bilateral ureteral orifices. ?  ?11/06/2021 Pathology Results  ? A. LYMPH NODE, LEFT PELVIC, EXCISION:  ?- Four lymph nodes, negative for carcinoma (0/4)  ? ?B. PERITONEUM, BIOPSY:  ?- Peritonealized soft tissue with focal fat necrosis  ?- Negative for carcinoma  ? ?C. LYMPH NODE, RIGHT PELVIC, EXCISION:  ?- Five lymph nodes, negative for carcinoma (0/5)  ? ?D. ROUND LIGAMENT REMNANT, EXCISION:  ?- Negative for carcinoma  ? ?E. PARA-AORTIC, RIGHT, EXCISION:  ?- Lymph node, negative for carcinoma (0 ?  ? ? ?Interval History: ?Patient presents today for follow-up.  Notes increase in her Parkinson symptoms.  Endorses fatigue, constipation which she is treating with MiraLAX and Dulcolax, back pain, and difficulty walking.  She also is experiencing hot flashes.  Denies change to urinary symptoms.  Denies any vaginal bleeding or discharge.  Continues to walk a mile a day. ? ?She saw Dr. Sondra Come on 4/12.  Saw one of the radiation  oncologist at [facility] and is hoping to move forward with vaginal brachytherapy in [place]. ? ?Past Medical/Surgical History: ?Past Medical History:  ?Diagnosis Date  ? Anxiety   ? Depression   ? GERD (gastroesophageal reflux disease)   ? Hypertension   ? Neuromuscular disorder ([place])   ? Osteoarthritis   ? Osteopenia   ? Parkinson disease ([place])   ? Pneumonia   ? Tremors of nervous system   ? legs  ? uterine ca 10/08/2021  ? also endometrial cancer  ? ? ?Past Surgical History:  ?Procedure Laterality Date  ? CARPAL TUNNEL RELEASE Bilateral 1996/1997  ? CYSTOSCOPY N/A 10/08/2021  ? Procedure: CYSTOSCOPY;  Surgeon: Lafonda Mosses, MD;  Location: [facility];  Service: Gynecology;  Laterality: N/A;  ? ROBOTIC ASSISTED TOTAL HYSTERECTOMY WITH BILATERAL SALPINGO OOPHERECTOMY N/A 10/08/2021  ? Procedure: XI ROBOTIC ASSISTED TOTAL HYSTERECTOMY WITH BILATERAL SALPINGO OOPHORECTOMY;SENTINEL LYMPH NODE INJECTION;  Surgeon: Lafonda Mosses, MD;  Location: [facility];  Service: Gynecology;  Laterality: N/A;  ? SQUAMOUS CELL CARCINOMA EXCISION Left 2017  ? Left jawline  ? TUBAL LIGATION  1980's  ? ? ?Family History  ?Problem Relation Age of Onset  ? Endometrial cancer Mother   ? Diabetes Mother   ? Alzheimer's disease Father   ? Coronary artery disease Father   ? Diabetes Sister   ? COPD Sister   ? Diabetes Sister   ? Heart disease Brother   ? Other Brother   ?     MVA   ?  Parkinson's disease Brother   ? Heart disease Brother   ? Alcohol abuse Brother   ? Heart disease Brother   ? Parkinson's disease Maternal Aunt   ? Breast cancer Maternal Aunt   ? Colon cancer Paternal Aunt   ? Lung cancer Paternal Uncle   ? Healthy Son   ? Healthy Son   ? Prostate cancer Neg Hx   ? Pancreatic cancer Neg Hx   ? Ovarian cancer Neg Hx   ? ? ?Social History  ? ?Socioeconomic History  ? Marital status: Married  ?  Spouse name: Not on file  ? Number of children: Not on file  ? Years of education: Not on file  ? Highest education level: Not on  file  ?Occupational History  ? 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 Smoking status: Never  ? Smokeless tobacco: Never  ?Vaping Use  ? Vaping Use: Never used  ?Substance and

## 2021-12-02 ENCOUNTER — Ambulatory Visit: Payer: PPO | Admitting: Neurology

## 2021-12-02 ENCOUNTER — Encounter: Payer: Self-pay | Admitting: Neurology

## 2021-12-02 ENCOUNTER — Telehealth: Payer: Self-pay | Admitting: Radiology

## 2021-12-02 VITALS — BP 153/82 | HR 71 | Ht 60.0 in | Wt 143.2 lb

## 2021-12-02 DIAGNOSIS — F439 Reaction to severe stress, unspecified: Secondary | ICD-10-CM

## 2021-12-02 DIAGNOSIS — G20 Parkinson's disease: Secondary | ICD-10-CM

## 2021-12-02 MED ORDER — CARBIDOPA-LEVODOPA 25-100 MG PO TABS: ORAL_TABLET | ORAL | 3 refills | Status: DC

## 2021-12-02 NOTE — Telephone Encounter (Signed)
Patient would like to proceed with radiation treatment at [facility] with Dr Sondra Come. ?

## 2021-12-02 NOTE — Patient Instructions (Signed)
I agree that the stress of your cancer diagnosis and treatment has affected your Parkinson symptoms.  Please hang in there! ?As discussed, we will do 1-1/2 pills alternating with 1 pill for total of 5 doses.  Please take 1-1/2 pills at 4 AM, 1 pill at 7 AM, 1-1/2 pills at 11 AM, 1 pill at 3 PM and 1-1/2 pills at 7 PM daily.  Please check in with [place] with an update via MyChart messaging in about a month and follow-up in 3 months. ?

## 2021-12-02 NOTE — Progress Notes (Signed)
Subjective:  ?  ?Patient ID: Ashley Neal is a 76 y.o. female. ? ?HPI ? ? ? ?Interim history:  ? ?Ashley Neal is a 76 year old right-handed woman with an underlying medical history of hypertension, hyperlipidemia, history of colitis, osteoarthritis, osteoporosis, reflux disease, anxiety, overweight state and recent diagnosis of endometrial cancer with status post robotic assisted total hysterectomy and bilateral salpingo-oophorectomy on 10/08/2021 as well as robotic assisted lymph node dissection and para-aortic lymphadenectomy on 11/06/2021, who presents for follow-up consultation of her Parkinson's disease.  The patient is unaccompanied today.  I last saw her on 04/08/2021, at which time she reported dizziness, lack of energy and blood pressure fluctuation.  She was taken off of hydrochlorothiazide because of supine hypertension and her lisinopril was increased.  She reported trying THC Gummies recently which reduced her tremor.  She was advised to use compression socks and stay well-hydrated.  She was advised to continue to stay off of pramipexole as it was for as needed use for restless leg syndrome and she had not used it.  She was advised to continue with Sinemet 1 pill 4 times a day.   ? ?She saw Ward Givens, NP in the interim for sooner than scheduled visit on 06/20/2021, at which time she reported worsening symptoms of her Parkinson's disease.  She needed more assistance she had issues with back pain and this decreased her mobility further.  She was using Mirapex 0.125 mg at bedtime at the time. ? ?Today, 12/02/2021: She reports not doing well, her tremor has become worse, her stress level has increased, she is having a hard time coping, she is tearful in the beginning of this visit.  She reports that she has not been able to enjoy her married life, she recently got married right around the time that she was diagnosed with her cancer.  She has a good support system with several friends that are taking  care of her and able to take her to her appointments.  She is scheduling her radiation appointment soon.  She has not started radiation treatment yet.  She takes her levodopa on a 5 hourly basis, sometimes she takes her first dose around 4 AM when she is awake and cannot go back to sleep.  Hemoglobin tolerating her of the doses accordingly.  She continues to try to stay active by walking on a regular basis.  She typically likes to eat lunch between 12 and 1 PM, breakfast is generally around 8 AM and dinner around 6.  She takes her levodopa around 11 AM, 4 PM and 9 PM consistently.  She does feel sleepy soon after 7 PM.  She is willing to increase her levodopa. ?  ?The patient's allergies, current medications, family history, past medical history, past social history, past surgical history and problem list were reviewed and updated as appropriate.  ?  ?  ?Previously: ?  ?I saw her on 10/08/2020, at which time she reported blood pressure fluctuations and dizziness associated with low BP values. She had irregularity on heart auscultation and was advised to FU with PCP and discuss cardiology referral. She was advised to keep her Sinemet at 1 pill qid.  ?  ?  ?I saw her on 04/02/2020, at which time, she reported worsening tremors and posture was worse.  She had stopped taking the Mirapex because she did not have much in the way of restless leg symptoms anymore.  She was planning a trip to [place] in October 2021.  She was referred  to physical therapy and we increased her Sinemet to 1 pill 4 times a day.  She emailed back a week or so later reporting that the Sinemet was helpful at the increased dose.   ?  ?  ?  ?  ?  ?I saw her on 10/04/2019, at which time she reported Some postural instability.  She felt that she had more tremor.  She had worsening urinary incontinence but constipation was better.  She was encouraged to see urology.  She endorsed some restlessness particularly affecting her feet at night.  She was advised  to start low-dose Mirapex particularly for restless leg symptoms. ?  ?  ?I saw her on 03/31/2019, at which time, she reported overall doing well, sometimes she had some more gait instability. She was active, but not able to pursue any of her group exercises including cardio drumming.  She had issues with constipation. She had bilateral foot pain, and was supposed to see a podiatrist soon. She was considering CBD oil for arthritis and pain. ?  ?  ?  ?I saw her on 09/29/2018, at which time she reported that she tried tai chi but did not enjoy it.  She looked into boxing classes but decided not to pursue it, she did start cardio drumming which she was enjoying as well as chair yoga.  She had occasional symptoms of restless legs.  She felt stable motor wise, she had an occasional flareup in the right hand tremor, rare constipation issues.  She was advised to continue with Sinemet 1 pill 3 times daily. ?   ?I saw her on 03/29/2018 at which time she reported doing okay. She was taking Sinemet 3 times a day but sometimes would forget the midday dose. Is trying to exercise on a regular basis, would go to the gym about 3 days of the week. 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 I suggested she continue with Sinemet 3 times a day. ?  ?I saw her on 11/11/2017, at which time she reported diffuse aches and pains. She had seen orthopedics. She had blood work with PCP. She had seen Dr. Carles Collet twice. She was trying to exercise. She was advised to follow-up routinely and advised that we would continue to observe her and follow clinically. ?  ?She called in the interim in late April after she had seen rheumatology. She was advised to start Sinemet with gradual titration at the time. ?  ?[name] on 04/16/2017 at the request of her primary care physician, at which time she reported a several month history of intermittent right hand tremors and also additional symptoms including difficulty turning over in bed or  certain movements when dancing. I suggested observation and a 6 month follow-up. I did suggest we could proceed with a brain MRI but she declined as she was highly claustrophobic and simple anti-anxiety medication would not help. She had interim appointments with Dr. Carles Collet on 06/09/17 and again on 10/20/17. I reviewed the notes. Potential symptomatic medication was discussed, a PET scan was also discussed.  ?  ?  ?04/16/2017: (She) reports a several month history of a intermittent right hand tremor. She also reports difficulty with certain movements such as turning over in bed or certain movements when dancing. I reviewed your office note from 04/09/2017, which you kindly included. She reports recent weight loss without particularly trying to lose weight. Blood work through your office from 04/02/2017 was reviewed revealing normal CMP with a glucose  level of 105, total cholesterol of 107, triglycerides 171, LDL 126. Recent A1c on 04/09/2017 was borderline at 5.8. CBC with differential was normal, B12 434. N TSH and N CK level on 04/09/17.  ?No FHx of PD or ET, father had dementia and lived to be 82, mom died at 71 from metastatic endom cancer. One brother with asthma, 2 sister, younger brother with EtOH abuse, youngest brother died at 24 yo in a car accident.  ?She has R shoulder problems, has had back pain in the mid back. Has had dexterity issue. She saw ortho at [place], and has been to [facility] for PT.  ?She is doing exercises. 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  ?Has retired recently, 2 grown sons, one of them local, the other in [place].  ?She is a non-smoker. ? ?Her Past Medical History Is Significant For: ?Past Medical History:  ?Diagnosis Date  ? Anxiety   ? Depression   ? GERD (gastroesophageal reflux disease)   ? Hypertension   ? Neuromuscular disorder ([place])   ? Osteoarthritis   ? Osteopenia   ? Parkinson disease ([place])   ? Pneumonia   ? Tremors of nervous system   ? legs  ? uterine ca  10/08/2021  ? also endometrial cancer  ? ? ?Her Past Surgical History Is Significant For: ?Past Surgical History:  ?Procedure Laterality Date  ? CARPAL TUNNEL RELEASE Bilateral 1996/1997  ? CYSTOSCOPY N/A 2

## 2021-12-04 ENCOUNTER — Encounter: Payer: Self-pay | Admitting: Neurology

## 2021-12-04 DIAGNOSIS — E785 Hyperlipidemia, unspecified: Secondary | ICD-10-CM | POA: Diagnosis not present

## 2021-12-04 DIAGNOSIS — C55 Malignant neoplasm of uterus, part unspecified: Secondary | ICD-10-CM | POA: Diagnosis not present

## 2021-12-04 DIAGNOSIS — I10 Essential (primary) hypertension: Secondary | ICD-10-CM | POA: Diagnosis not present

## 2021-12-04 DIAGNOSIS — K219 Gastro-esophageal reflux disease without esophagitis: Secondary | ICD-10-CM | POA: Diagnosis not present

## 2021-12-04 DIAGNOSIS — Z79899 Other long term (current) drug therapy: Secondary | ICD-10-CM | POA: Diagnosis not present

## 2021-12-04 DIAGNOSIS — C541 Malignant neoplasm of endometrium: Secondary | ICD-10-CM | POA: Diagnosis not present

## 2021-12-06 DIAGNOSIS — Z510 Encounter for antineoplastic radiation therapy: Secondary | ICD-10-CM | POA: Diagnosis not present

## 2021-12-06 DIAGNOSIS — F4323 Adjustment disorder with mixed anxiety and depressed mood: Secondary | ICD-10-CM | POA: Diagnosis not present

## 2021-12-06 DIAGNOSIS — C541 Malignant neoplasm of endometrium: Secondary | ICD-10-CM | POA: Diagnosis not present

## 2021-12-06 DIAGNOSIS — C55 Malignant neoplasm of uterus, part unspecified: Secondary | ICD-10-CM | POA: Diagnosis not present

## 2021-12-08 ENCOUNTER — Encounter: Payer: Self-pay | Admitting: Neurology

## 2021-12-13 ENCOUNTER — Telehealth: Payer: Self-pay | Admitting: *Deleted

## 2021-12-13 NOTE — Telephone Encounter (Signed)
CALLED PATIENT TO VERIFY WHERE SHE WILL BE HAVING TREATMENT AT, PATIENT STATED THAT SHE  WILL BE HAVING [facility] WITH DR. Erlene Quan, NOTIFIED DR. KINARD ?

## 2021-12-17 DIAGNOSIS — F4323 Adjustment disorder with mixed anxiety and depressed mood: Secondary | ICD-10-CM | POA: Diagnosis not present

## 2021-12-18 DIAGNOSIS — C541 Malignant neoplasm of endometrium: Secondary | ICD-10-CM | POA: Diagnosis not present

## 2021-12-18 DIAGNOSIS — Z510 Encounter for antineoplastic radiation therapy: Secondary | ICD-10-CM | POA: Diagnosis not present

## 2021-12-25 DIAGNOSIS — Z510 Encounter for antineoplastic radiation therapy: Secondary | ICD-10-CM | POA: Diagnosis not present

## 2021-12-25 DIAGNOSIS — R300 Dysuria: Secondary | ICD-10-CM | POA: Diagnosis not present

## 2021-12-25 DIAGNOSIS — C541 Malignant neoplasm of endometrium: Secondary | ICD-10-CM | POA: Diagnosis not present

## 2021-12-27 DIAGNOSIS — L821 Other seborrheic keratosis: Secondary | ICD-10-CM | POA: Diagnosis not present

## 2021-12-27 DIAGNOSIS — L218 Other seborrheic dermatitis: Secondary | ICD-10-CM | POA: Diagnosis not present

## 2021-12-27 DIAGNOSIS — C44319 Basal cell carcinoma of skin of other parts of face: Secondary | ICD-10-CM | POA: Diagnosis not present

## 2021-12-27 DIAGNOSIS — L570 Actinic keratosis: Secondary | ICD-10-CM | POA: Diagnosis not present

## 2021-12-30 ENCOUNTER — Ambulatory Visit: Payer: PPO | Admitting: Physical Therapy

## 2021-12-31 DIAGNOSIS — F4323 Adjustment disorder with mixed anxiety and depressed mood: Secondary | ICD-10-CM | POA: Diagnosis not present

## 2022-01-01 DIAGNOSIS — Z510 Encounter for antineoplastic radiation therapy: Secondary | ICD-10-CM | POA: Diagnosis not present

## 2022-01-01 DIAGNOSIS — C541 Malignant neoplasm of endometrium: Secondary | ICD-10-CM | POA: Diagnosis not present

## 2022-01-03 ENCOUNTER — Ambulatory Visit: Payer: PPO | Admitting: Physical Therapy

## 2022-01-03 DIAGNOSIS — M533 Sacrococcygeal disorders, not elsewhere classified: Secondary | ICD-10-CM | POA: Diagnosis not present

## 2022-01-03 DIAGNOSIS — Z6826 Body mass index (BMI) 26.0-26.9, adult: Secondary | ICD-10-CM | POA: Diagnosis not present

## 2022-01-08 ENCOUNTER — Other Ambulatory Visit: Payer: Self-pay | Admitting: Neurosurgery

## 2022-01-08 DIAGNOSIS — Z510 Encounter for antineoplastic radiation therapy: Secondary | ICD-10-CM | POA: Diagnosis not present

## 2022-01-08 DIAGNOSIS — M533 Sacrococcygeal disorders, not elsewhere classified: Secondary | ICD-10-CM

## 2022-01-08 DIAGNOSIS — C541 Malignant neoplasm of endometrium: Secondary | ICD-10-CM | POA: Diagnosis not present

## 2022-01-09 DIAGNOSIS — L219 Seborrheic dermatitis, unspecified: Secondary | ICD-10-CM | POA: Diagnosis not present

## 2022-01-09 DIAGNOSIS — Z90710 Acquired absence of both cervix and uterus: Secondary | ICD-10-CM | POA: Diagnosis not present

## 2022-01-09 DIAGNOSIS — G8929 Other chronic pain: Secondary | ICD-10-CM | POA: Diagnosis not present

## 2022-01-09 DIAGNOSIS — M545 Low back pain, unspecified: Secondary | ICD-10-CM | POA: Diagnosis not present

## 2022-01-09 DIAGNOSIS — M5136 Other intervertebral disc degeneration, lumbar region: Secondary | ICD-10-CM | POA: Diagnosis not present

## 2022-01-09 DIAGNOSIS — C541 Malignant neoplasm of endometrium: Secondary | ICD-10-CM | POA: Diagnosis not present

## 2022-01-09 DIAGNOSIS — G20 Parkinson's disease: Secondary | ICD-10-CM | POA: Diagnosis not present

## 2022-01-15 DIAGNOSIS — Z510 Encounter for antineoplastic radiation therapy: Secondary | ICD-10-CM | POA: Diagnosis not present

## 2022-01-15 DIAGNOSIS — C541 Malignant neoplasm of endometrium: Secondary | ICD-10-CM | POA: Diagnosis not present

## 2022-01-21 DIAGNOSIS — F4323 Adjustment disorder with mixed anxiety and depressed mood: Secondary | ICD-10-CM | POA: Diagnosis not present

## 2022-01-25 ENCOUNTER — Ambulatory Visit
Admission: RE | Admit: 2022-01-25 | Discharge: 2022-01-25 | Disposition: A | Discharge status: Home / Self Care | Payer: PPO | Source: Ambulatory Visit | Attending: Neurosurgery | Admitting: Neurosurgery

## 2022-01-25 DIAGNOSIS — R110 Nausea: Secondary | ICD-10-CM | POA: Diagnosis not present

## 2022-01-25 DIAGNOSIS — S76312A Strain of muscle, fascia and tendon of the posterior muscle group at thigh level, left thigh, initial encounter: Secondary | ICD-10-CM | POA: Diagnosis not present

## 2022-01-25 DIAGNOSIS — M533 Sacrococcygeal disorders, not elsewhere classified: Secondary | ICD-10-CM | POA: Diagnosis not present

## 2022-01-25 DIAGNOSIS — Z8542 Personal history of malignant neoplasm of other parts of uterus: Secondary | ICD-10-CM | POA: Diagnosis not present

## 2022-01-25 MED ORDER — GADOBENATE DIMEGLUMINE 529 MG/ML IV SOLN
12.0000 mL | Freq: Once | INTRAVENOUS | Status: AC | PRN
  Administered 2022-01-25: 12 mL via INTRAVENOUS

## 2022-01-29 DIAGNOSIS — M533 Sacrococcygeal disorders, not elsewhere classified: Secondary | ICD-10-CM | POA: Diagnosis not present

## 2022-01-29 DIAGNOSIS — Z6826 Body mass index (BMI) 26.0-26.9, adult: Secondary | ICD-10-CM | POA: Diagnosis not present

## 2022-01-30 DIAGNOSIS — M5459 Other low back pain: Secondary | ICD-10-CM | POA: Diagnosis not present

## 2022-01-30 DIAGNOSIS — M545 Low back pain, unspecified: Secondary | ICD-10-CM | POA: Diagnosis not present

## 2022-01-31 ENCOUNTER — Other Ambulatory Visit: Payer: Self-pay

## 2022-01-31 ENCOUNTER — Encounter (HOSPITAL_COMMUNITY): Payer: Self-pay

## 2022-01-31 ENCOUNTER — Emergency Department (HOSPITAL_COMMUNITY)
Admission: EM | Admit: 2022-01-31 | Discharge: 2022-01-31 | Disposition: A | Discharge status: Home / Self Care | Payer: PPO | Attending: Emergency Medicine | Admitting: Emergency Medicine

## 2022-01-31 DIAGNOSIS — N390 Urinary tract infection, site not specified: Secondary | ICD-10-CM | POA: Insufficient documentation

## 2022-01-31 DIAGNOSIS — M533 Sacrococcygeal disorders, not elsewhere classified: Secondary | ICD-10-CM | POA: Insufficient documentation

## 2022-01-31 DIAGNOSIS — R030 Elevated blood-pressure reading, without diagnosis of hypertension: Secondary | ICD-10-CM

## 2022-01-31 DIAGNOSIS — I10 Essential (primary) hypertension: Secondary | ICD-10-CM | POA: Diagnosis not present

## 2022-01-31 DIAGNOSIS — Z79899 Other long term (current) drug therapy: Secondary | ICD-10-CM | POA: Diagnosis not present

## 2022-01-31 DIAGNOSIS — F4323 Adjustment disorder with mixed anxiety and depressed mood: Secondary | ICD-10-CM | POA: Diagnosis not present

## 2022-01-31 DIAGNOSIS — M545 Low back pain, unspecified: Secondary | ICD-10-CM | POA: Diagnosis not present

## 2022-01-31 DIAGNOSIS — Z7982 Long term (current) use of aspirin: Secondary | ICD-10-CM | POA: Insufficient documentation

## 2022-01-31 DIAGNOSIS — M792 Neuralgia and neuritis, unspecified: Secondary | ICD-10-CM | POA: Diagnosis not present

## 2022-01-31 LAB — URINALYSIS, ROUTINE W REFLEX MICROSCOPIC
Bacteria, UA: NONE SEEN
Bilirubin Urine: NEGATIVE
Glucose, UA: NEGATIVE mg/dL
Hgb urine dipstick: NEGATIVE
Ketones, ur: 20 mg/dL — AB
Nitrite: NEGATIVE
Protein, ur: 30 mg/dL — AB
Specific Gravity, Urine: 1.027 (ref 1.005–1.030)
WBC, UA: >50 WBC/hpf — ABNORMAL HIGH (ref 0–5)
pH: 5 (ref 5.0–8.0)

## 2022-01-31 LAB — CBC WITH DIFFERENTIAL/PLATELET
Abs Immature Granulocytes: 0.02 10*3/uL (ref 0.00–0.07)
Basophils Absolute: 0 10*3/uL (ref 0.0–0.1)
Basophils Relative: 0 %
Eosinophils Absolute: 0 10*3/uL (ref 0.0–0.5)
Eosinophils Relative: 1 %
HCT: 36.7 % (ref 36.0–46.0)
Hemoglobin: 12.6 g/dL (ref 12.0–15.0)
Immature Granulocytes: 0 %
Lymphocytes Relative: 21 %
Lymphs Abs: 1 10*3/uL (ref 0.7–4.0)
MCH: 30.7 pg (ref 26.0–34.0)
MCHC: 34.3 g/dL (ref 30.0–36.0)
MCV: 89.5 fL (ref 80.0–100.0)
Monocytes Absolute: 0.6 10*3/uL (ref 0.1–1.0)
Monocytes Relative: 12 %
Neutro Abs: 3.2 10*3/uL (ref 1.7–7.7)
Neutrophils Relative %: 66 %
Platelets: 181 10*3/uL (ref 150–400)
RBC: 4.1 MIL/uL (ref 3.87–5.11)
RDW: 12.7 % (ref 11.5–15.5)
WBC: 4.8 10*3/uL (ref 4.0–10.5)
nRBC: 0 % (ref 0.0–0.2)

## 2022-01-31 LAB — COMPREHENSIVE METABOLIC PANEL
ALT: 6 U/L (ref 0–44)
AST: 15 U/L (ref 15–41)
Albumin: 4.3 g/dL (ref 3.5–5.0)
Alkaline Phosphatase: 53 U/L (ref 38–126)
Anion gap: 7 (ref 5–15)
BUN: 15 mg/dL (ref 8–23)
CO2: 29 mmol/L (ref 22–32)
Calcium: 10 mg/dL (ref 8.9–10.3)
Chloride: 107 mmol/L (ref 98–111)
Creatinine, Ser: 0.85 mg/dL (ref 0.44–1.00)
GFR, Estimated: >60 mL/min (ref >60)
Glucose, Bld: 105 mg/dL — ABNORMAL HIGH (ref 70–99)
Potassium: 4 mmol/L (ref 3.5–5.1)
Sodium: 143 mmol/L (ref 135–145)
Total Bilirubin: 0.7 mg/dL (ref 0.3–1.2)
Total Protein: 6.7 g/dL (ref 6.5–8.1)

## 2022-01-31 MED ORDER — PREDNISONE 20 MG PO TABS: ORAL_TABLET | ORAL | 0 refills | Status: DC

## 2022-01-31 MED ORDER — ONDANSETRON HCL 4 MG/2ML IJ SOLN
4.0000 mg | Freq: Once | INTRAMUSCULAR | Status: AC
  Administered 2022-01-31: 4 mg via INTRAVENOUS
  Filled 2022-01-31: qty 2

## 2022-01-31 MED ORDER — DEXAMETHASONE SODIUM PHOSPHATE 10 MG/ML IJ SOLN
10.0000 mg | Freq: Once | INTRAMUSCULAR | Status: AC
  Administered 2022-01-31: 10 mg via INTRAVENOUS
  Filled 2022-01-31: qty 1

## 2022-01-31 MED ORDER — CEPHALEXIN 500 MG PO CAPS: 500.0000 mg | ORAL_CAPSULE | Freq: Three times a day (TID) | ORAL | 0 refills | Status: DC

## 2022-01-31 MED ORDER — HYDROMORPHONE HCL 1 MG/ML IJ SOLN
0.5000 mg | Freq: Once | INTRAMUSCULAR | Status: AC
  Administered 2022-01-31: 0.5 mg via INTRAVENOUS
  Filled 2022-01-31: qty 1

## 2022-01-31 MED ORDER — SODIUM CHLORIDE 0.9 % IV SOLN
1.0000 g | Freq: Once | INTRAVENOUS | Status: AC
  Administered 2022-01-31: 1 g via INTRAVENOUS
  Filled 2022-01-31: qty 10

## 2022-01-31 MED ORDER — TRAMADOL HCL 50 MG PO TABS: 50.0000 mg | ORAL_TABLET | Freq: Four times a day (QID) | ORAL | 0 refills | Status: DC | PRN

## 2022-01-31 MED ORDER — METHOCARBAMOL 750 MG PO TABS: 750.0000 mg | ORAL_TABLET | Freq: Three times a day (TID) | ORAL | 0 refills | Status: DC | PRN

## 2022-01-31 NOTE — ED Provider Notes (Signed)
[facility]-EMERGENCY DEPT Provider Note   CSN: [id number] Arrival date & time: 01/31/22  1925     History  Chief Complaint  Patient presents with   Back Pain    Ashley Neal is a 76 y.o. female.  Pt c/o lower back/sacral area pain for the past 10 months. Indicates pain constant, dull, severe, non radiating, without specific exacerbating or alleviating factors. Has seen pcp, ortho, NS for same - just saw NS this week, has had MRI imaging - was told no definite cause of pain. Area of pain is localized to few cm diameter in midline/mid sacral area. No skin changes, swelling or redness to area. No radicular pain. No anterior pain, no abd/pelvic pain. Indicates txd in past several months for endometrial ca, but was told not related to that either. No fever or chills. No numbness/weakness.   The history is provided by the patient, a friend and medical records.  Back Pain Associated symptoms: no abdominal pain, no chest pain, no dysuria, no fever, no numbness and no weakness        Home Medications Prior to Admission medications   Medication Sig Start Date End Date Taking? Authorizing Provider  ALPRAZolam (XANAX) 0.25 MG tablet Take 0.25 mg by mouth 2 (two) times daily as needed for anxiety. 07/23/20   [provider]  aspirin EC 81 MG tablet Take 81 mg by mouth daily.    [provider]  bisacodyl (DULCOLAX) 5 MG EC tablet Take by mouth.    [provider]  carbidopa-levodopa (SINEMET IR) 25-100 MG tablet 1-1/2 pills at 4 AM, 1 pill at 7 AM, 1-1/2 pills at 11 AM, 1 pill at 3 PM, 1-1/2 pills at 7 PM daily 12/02/21   Huston Foley, MD  lisinopril (ZESTRIL) 10 MG tablet Take 1 tablet (10 mg total) by mouth daily. May take extra 5 mg (half tablet) daily if BP over 160 04/09/21   Corky Crafts, MD  Multiple Vitamin (MULTIVITAMIN) tablet Take 1 tablet by mouth daily.    [provider]  polyethylene glycol powder  (GLYCOLAX/MIRALAX) 17 GM/SCOOP powder Take 17 g by mouth every other day.    [provider]      Allergies    Codeine and Tape    Review of Systems   Review of Systems  Constitutional:  Negative for fever.  Respiratory:  Negative for shortness of breath.   Cardiovascular:  Negative for chest pain.  Gastrointestinal:  Negative for abdominal pain and vomiting.  Genitourinary:  Negative for dysuria.  Musculoskeletal:  Positive for back pain.  Skin:  Negative for rash.  Neurological:  Negative for weakness and numbness.    Physical Exam Updated Vital Signs BP (!) 173/75   Pulse 69   Temp 98 F (36.7 C) (Oral)   Resp 16   Ht 1.524 m (5')   Wt 62.5 kg   SpO2 99%   BMI 26.91 kg/m  Physical Exam Vitals and nursing note reviewed.  Constitutional:      Appearance: Normal appearance. She is well-developed.  HENT:     Head: Atraumatic.     Nose: Nose normal.     Mouth/Throat:     Mouth: Mucous membranes are moist.  Eyes:     General: No scleral icterus.    Conjunctiva/sclera: Conjunctivae normal.  Neck:     Trachea: No tracheal deviation.  Cardiovascular:     Rate and Rhythm: Normal rate.     Pulses: Normal pulses.  Pulmonary:     Effort: Pulmonary effort is normal. No respiratory distress.  Abdominal:     General: There is no distension.     Palpations: Abdomen is soft. There is no mass.     Tenderness: There is no abdominal tenderness. There is no guarding.  Genitourinary:    Comments: No cva tenderness.  Musculoskeletal:        General: No swelling.     Cervical back: Neck supple. No muscular tenderness.     Comments: LS spine non tender, aligned. No soft tissue swelling. No skin lesions or erythema. No abscess.   Skin:    General: Skin is warm and dry.     Findings: No rash.  Neurological:     Mental Status: She is alert.     Comments: Alert, speech normal. Motor/sens grossly intact bil.   Psychiatric:        Mood and Affect: Mood normal.     ED  Results / Procedures / Treatments   Labs (all labs ordered are listed, but only abnormal results are displayed) Results for orders placed or performed during the hospital encounter of 01/31/22  Urinalysis, Routine w reflex microscopic Urine, Clean Catch  Result Value Ref Range   Color, Urine AMBER (A) YELLOW   APPearance HAZY (A) CLEAR   Specific Gravity, Urine 1.027 1.005 - 1.030   pH 5.0 5.0 - 8.0   Glucose, UA NEGATIVE NEGATIVE mg/dL   Hgb urine dipstick NEGATIVE NEGATIVE   Bilirubin Urine NEGATIVE NEGATIVE   Ketones, ur 20 (A) NEGATIVE mg/dL   Protein, ur 30 (A) NEGATIVE mg/dL   Nitrite NEGATIVE NEGATIVE   Leukocytes,Ua LARGE (A) NEGATIVE   RBC / HPF 11-20 0 - 5 RBC/hpf   WBC, UA >50 (H) 0 - 5 WBC/hpf   Bacteria, UA NONE SEEN NONE SEEN   Squamous Epithelial / LPF 0-5 0 - 5   Mucus PRESENT    Hyaline Casts, UA PRESENT    Ca Oxalate Crys, UA PRESENT   CBC with Differential  Result Value Ref Range   WBC 4.8 4.0 - 10.5 K/uL   RBC 4.10 3.87 - 5.11 MIL/uL   Hemoglobin 12.6 12.0 - 15.0 g/dL   HCT 09.6 04.5 - 40.9 %   MCV 89.5 80.0 - 100.0 fL   MCH 30.7 26.0 - 34.0 pg   MCHC 34.3 30.0 - 36.0 g/dL   RDW 81.1 91.4 - 78.2 %   Platelets 181 150 - 400 K/uL   nRBC 0.0 0.0 - 0.2 %   Neutrophils Relative % 66 %   Neutro Abs 3.2 1.7 - 7.7 K/uL   Lymphocytes Relative 21 %   Lymphs Abs 1.0 0.7 - 4.0 K/uL   Monocytes Relative 12 %   Monocytes Absolute 0.6 0.1 - 1.0 K/uL   Eosinophils Relative 1 %   Eosinophils Absolute 0.0 0.0 - 0.5 K/uL   Basophils Relative 0 %   Basophils Absolute 0.0 0.0 - 0.1 K/uL   Immature Granulocytes 0 %   Abs Immature Granulocytes 0.02 0.00 - 0.07 K/uL  Comprehensive metabolic panel  Result Value Ref Range   Sodium 143 135 - 145 mmol/L   Potassium 4.0 3.5 - 5.1 mmol/L   Chloride 107 98 - 111 mmol/L   CO2 29 22 - 32 mmol/L   Glucose, Bld 105 (H) 70 - 99 mg/dL   BUN 15 8 - 23 mg/dL   Creatinine, Ser 9.56 0.44 - 1.00 mg/dL   Calcium 21.3 8.9 -  10.3  mg/dL   Total Protein 6.7 6.5 - 8.1 g/dL   Albumin 4.3 3.5 - 5.0 g/dL   AST 15 15 - 41 U/L   ALT 6 0 - 44 U/L   Alkaline Phosphatase 53 38 - 126 U/L   Total Bilirubin 0.7 0.3 - 1.2 mg/dL   GFR, Estimated >78 >46 mL/min   Anion gap 7 5 - 15   MR PELVIS W WO CONTRAST  Result Date: 01/27/2022 CLINICAL DATA:  Severe cerebral/back pain with nausea for 3 years. No known injury. History of uterine cancer. EXAM: MRI PELVIS WITHOUT AND WITH CONTRAST TECHNIQUE: Multiplanar multisequence MR imaging of the pelvis with attention to the sacrum was performed both before and after administration of intravenous contrast. CONTRAST:  12mL MULTIHANCE GADOBENATE DIMEGLUMINE 529 MG/ML IV SOLN COMPARISON:  Pelvic CT 10/29/2021.  Pelvic MRI 08/31/2021. FINDINGS: Bones/Joint/Cartilage No evidence of sacral fracture, osteonecrosis or abnormal enhancement. Mild sacroiliac degenerative changes bilaterally without erosive changes or abnormal enhancement. Both femoral heads appear normal, and there is no significant hip joint effusion. Stable mild degenerative changes within the visualized lower lumbar spine. Ligaments Unremarkable. Muscles and Tendons No focal muscular atrophy, edema or abnormal enhancement. The previously demonstrated partial tear of the left common hamstring tendon is only imaged in the axial plane and does not appear progressive. Mild gluteus tendinosis bilaterally. Soft tissue The internal pelvic contents appear unremarkable status post hysterectomy. No evidence of pelvic adenopathy, ascites or peritoneal nodularity. IMPRESSION: 1. No acute findings or clear explanation for the patient's symptoms. 2. Although incompletely visualized, grossly stable partial tear of the left common hamstring tendon. Mild gluteus tendinosis bilaterally. 3. No evidence of sacral fracture or metastatic disease. Mild sacroiliac degenerative changes bilaterally. Electronically Signed   By: Carey Bullocks M.D.   On: 01/27/2022 16:50       EKG None  Radiology No results found.  Procedures Procedures    Medications Ordered in ED Medications  dexamethasone (DECADRON) injection 10 mg (has no administration in time range)  HYDROmorphone (DILAUDID) injection 0.5 mg (has no administration in time range)  ondansetron (ZOFRAN) injection 4 mg (has no administration in time range)  cefTRIAXone (ROCEPHIN) 1 g in sodium chloride 0.9 % 100 mL IVPB (has no administration in time range)    ED Course/ Medical Decision Making/ A&P                           Medical Decision Making Problems Addressed: Acute UTI: acute illness or injury with systemic symptoms Elevated blood pressure reading: acute illness or injury Essential hypertension: chronic illness or injury with exacerbation, progression, or side effects of treatment that poses a threat to life or bodily functions Sacral back pain: acute illness or injury with systemic symptoms that poses a threat to life or bodily functions  Amount and/or Complexity of Data Reviewed Independent Historian: friend    Details: hx External Data Reviewed: notes. Labs: ordered. Decision-making details documented in ED Course. Radiology:  Decision-making details documented in ED Course.  Risk Prescription drug management. Parenteral controlled substances. Decision regarding hospitalization.   Iv ns. Continuous pulse ox and cardiac monitoring. Labs ordered/sent.   Differential dx includes ddd, herniated disc, intractable pain/sacral pain, uti, etc. - dispo decision including potential need for admission considered in event intractable pain, significant abn labs or findings on imaging - will give meds, fluids, and reassess dispo.   Reviewed nursing notes and prior charts for additional history. External reports  reviewed. Additional history from: friend.   Cardiac monitor: sinus rhythm, rate 69.  Labs reviewed/interpreted by me - ua c/w uti.  Rocephin iv.   Recent MRI  reviewed/interpreted by me - no new fx.   Recheck pain improved, pt comfortable appearing.   Rec pcp f/u.  Return precautions provided.            Final Clinical Impression(s) / ED Diagnoses Final diagnoses:  None    Rx / DC Orders ED Discharge Orders     None         Cathren Laine, MD 01/31/22 2207

## 2022-02-12 DIAGNOSIS — L218 Other seborrheic dermatitis: Secondary | ICD-10-CM | POA: Diagnosis not present

## 2022-02-12 DIAGNOSIS — L821 Other seborrheic keratosis: Secondary | ICD-10-CM | POA: Diagnosis not present

## 2022-02-12 DIAGNOSIS — C44319 Basal cell carcinoma of skin of other parts of face: Secondary | ICD-10-CM | POA: Diagnosis not present

## 2022-02-14 DIAGNOSIS — N390 Urinary tract infection, site not specified: Secondary | ICD-10-CM | POA: Diagnosis not present

## 2022-02-14 DIAGNOSIS — M545 Low back pain, unspecified: Secondary | ICD-10-CM | POA: Diagnosis not present

## 2022-02-17 ENCOUNTER — Telehealth: Payer: Self-pay

## 2022-02-17 DIAGNOSIS — R300 Dysuria: Secondary | ICD-10-CM | POA: Diagnosis not present

## 2022-02-17 DIAGNOSIS — M533 Sacrococcygeal disorders, not elsewhere classified: Secondary | ICD-10-CM | POA: Diagnosis not present

## 2022-02-17 NOTE — Telephone Encounter (Signed)
FYI: Pt called office stating she is a patient of Dr.Tucker. She finished radiation in June. She is having excruciating back pain. She has been to urgent care and the ER, nothing has helped. She states "all they are doing is giving me medicine that doesn't help" While speaking to her, she started crying stating " I am about to give up". Since her initial call to [place], she states she has an appointment with the NP at her PCP today.  I reassured pt and was able to get her to calm down. I told her to keep the appointment with the NP and she was going to be taken care of. She had calmed down and said she was thankful for the follow up call.  Joylene John NP, notified

## 2022-02-18 NOTE — Telephone Encounter (Signed)
I called pt today to follow up from message yesterday and to relay Dr.Tuckers message.   Pt states she was seen at her PCP yesterday. Along with the low back pain, she also has sores on her scalp and has had shingles in the past,so they are testing her for that. They also took a urine sample. Pt also states she has an appointment on 7/17 with the pain clinic and, in two weeks having a cancer spot removed from her face. Pt was very emotional and crying on the phone.   I reassured pt by relaying Dr.Tuckers message stating she reviewed the recent MRI and no evidence of metastatic disease was shown.I told her it sounds like she is being taken good care of from seeing the dr yesterday and with upcoming appointments. Advised pt to wait for results and see what the plan moving forward is with her PCP. She voiced an understanding and was thankful for the call.

## 2022-02-24 DIAGNOSIS — Z6826 Body mass index (BMI) 26.0-26.9, adult: Secondary | ICD-10-CM | POA: Diagnosis not present

## 2022-02-24 DIAGNOSIS — M533 Sacrococcygeal disorders, not elsewhere classified: Secondary | ICD-10-CM | POA: Diagnosis not present

## 2022-02-25 ENCOUNTER — Other Ambulatory Visit: Payer: Self-pay

## 2022-02-25 ENCOUNTER — Other Ambulatory Visit: Payer: Self-pay | Admitting: Neurosurgery

## 2022-02-25 ENCOUNTER — Encounter: Payer: Self-pay | Admitting: Physical Therapy

## 2022-02-25 ENCOUNTER — Ambulatory Visit: Payer: PPO | Attending: Family Medicine | Admitting: Physical Therapy

## 2022-02-25 ENCOUNTER — Telehealth: Payer: Self-pay

## 2022-02-25 DIAGNOSIS — M533 Sacrococcygeal disorders, not elsewhere classified: Secondary | ICD-10-CM | POA: Insufficient documentation

## 2022-02-25 DIAGNOSIS — R32 Unspecified urinary incontinence: Secondary | ICD-10-CM | POA: Diagnosis not present

## 2022-02-25 DIAGNOSIS — M5459 Other low back pain: Secondary | ICD-10-CM | POA: Insufficient documentation

## 2022-02-25 DIAGNOSIS — R293 Abnormal posture: Secondary | ICD-10-CM | POA: Insufficient documentation

## 2022-02-25 DIAGNOSIS — R269 Unspecified abnormalities of gait and mobility: Secondary | ICD-10-CM | POA: Insufficient documentation

## 2022-02-25 DIAGNOSIS — M62838 Other muscle spasm: Secondary | ICD-10-CM | POA: Diagnosis not present

## 2022-02-25 DIAGNOSIS — M6281 Muscle weakness (generalized): Secondary | ICD-10-CM | POA: Diagnosis not present

## 2022-02-25 NOTE — Telephone Encounter (Signed)
Pt states today she felt 2-3 bumps on the outside of her vagina between her labia and leg. No pain in that area. No fever/chills. She has not taken a mirror to look just felt it while she was bathing. She states she has not changed soaps.  She wanted Dr.tucker to be aware and advise.

## 2022-02-25 NOTE — Therapy (Signed)
OUTPATIENT PHYSICAL THERAPY FEMALE PELVIC EVALUATION   Patient Name: Ashley Neal MRN: [id number] DOB:August 04, 1946, 76 y.o., female Today's Date: 02/25/2022   PT End of Session - 02/25/22 1520     Visit Number 1    Date for PT Re-Evaluation 05/28/22    Authorization Type healthstream    PT Start Time 1445    PT Stop Time 1530    PT Time Calculation (min) 45 min    Activity Tolerance Patient limited by pain    Behavior During Therapy [facility] for tasks assessed/performed             Past Medical History:  Diagnosis Date   Anxiety    Depression    GERD (gastroesophageal reflux disease)    Hypertension    Neuromuscular disorder ([place])    Osteoarthritis    Osteopenia    Parkinson disease ([place])    Pneumonia    Tremors of nervous system    legs   uterine ca 10/08/2021   also endometrial cancer   Past Surgical History:  Procedure Laterality Date   CARPAL TUNNEL RELEASE Bilateral 1996/1997   CYSTOSCOPY N/A 10/08/2021   Procedure: CYSTOSCOPY;  Surgeon: Lafonda Mosses, MD;  Location: [facility];  Service: Gynecology;  Laterality: N/A;   ROBOTIC ASSISTED TOTAL HYSTERECTOMY WITH BILATERAL SALPINGO OOPHERECTOMY N/A 10/08/2021   Procedure: XI ROBOTIC ASSISTED TOTAL HYSTERECTOMY WITH BILATERAL SALPINGO OOPHORECTOMY;SENTINEL LYMPH NODE INJECTION;  Surgeon: Lafonda Mosses, MD;  Location: [facility];  Service: Gynecology;  Laterality: N/A;   SQUAMOUS CELL CARCINOMA EXCISION Left 2017   Left jawline   TUBAL LIGATION  1980's   Patient Active Problem List   Diagnosis Date Noted   Endometrial cancer ([place]) 10/16/2021   Complex atypical endometrial hyperplasia 09/27/2021   Primary osteoarthritis of both hands 05/14/2020   Primary osteoarthritis of both feet 05/14/2020   Age-related osteoporosis without current pathological fracture 05/14/2020   Prediabetes 05/14/2020   Essential hypertension 05/14/2020   History of hyperlipidemia 05/14/2020   History of squamous cell carcinoma  05/14/2020   Parkinsonism ([place]) 05/14/2020    PCP: not listed in chart  REFERRING PROVIDER: Faustino Congress, NP  REFERRING DIAG: M53.3 (ICD-10-CM) - Sacrococcygeal disorders, not elsewhere classified R32 (ICD-10-CM) - Unspecified urinary incontinence  THERAPY DIAG:  Other low back pain  Abnormality of gait and mobility  Abnormal posture  Muscle weakness (generalized)  Other muscle spasm  Rationale for Evaluation and Treatment Rehabilitation  ONSET DATE: 1 year ago  SUBJECTIVE:  SUBJECTIVE STATEMENT: Pt reports she has been having pain for a year now, has had one fall. Her parkinsons has gotten worse with worsening pain. Pt reports pain feels burning from low sacrum and radiates to bilateral sides. Pt very bothered by pain and used to do yoga, weekly workouts, walking 2 miles per day, but now unable to do any. Pt reports pain with sitting immediately.   Fluid intake: "I drink a lot of water all day"   PAIN:  Are you having pain? Yes NPRS scale: 8/10 Pain location:  posterior at sacrum and coccyx  Pain type: burning and sharp Pain description: constant   Aggravating factors: sitting, any pressure at low back Relieving factors: resting at sidelying or prone  PRECAUTIONS: Fall  WEIGHT BEARING RESTRICTIONS No  FALLS:  Has patient fallen in last 6 months? Yes. Number of falls 1  LIVING ENVIRONMENT: Lives with: lives alone Lives in: House/apartment Stairs: Yes: External: 1 steps; on left going up Has following equipment at home: None  OCCUPATION: retired  PLOF: Independent  PATIENT GOALS to have less pain  PERTINENT HISTORY:  ROBOTIC ASSISTED PELVIC LYMPH NODE DISSECTION, PARA-AORTIC LYMPHADENECTOMY, CYSTOSCOPY 11/06/21 due to endometrial CA;robotic assisted total  hysterectomy and bilateral salpingo-oophorectomy on 10/08/21; parkinsons, hypertension, hyperlipidemia, history of colitis, osteoarthritis, osteoporosis, reflux disease, anxiety, overweight  currently in radiation, allergic to tape MRI findinds 08/31/21: incidental finding of CA, osteoarthritis of bilateral hips.large high-grade partial-thickness tear of the left hamstring origin.  Partial-thickness tear of the left gluteus medius tendon insertion.  Mild osteoarthritis of bilateral SI joints also noted. Sexual abuse: No  BOWEL MOVEMENT Pain with bowel movement: No Type of bowel movement:Type (Bristol Stool Scale) 2-5, Frequency every third day, and Strain Yes Fully empty rectum: No Leakage: No Pads: No Fiber supplement: Yes: miralax, ducalax  URINATION Pain with urination: No Fully empty bladder: No has had recent UTIs that limits this Stream: Strong Urgency: Yes:   Frequency: every 2 hours Leakage:  none Pads: No  INTERCOURSE Pain with intercourse:  not painful Ability to have vaginal penetration:  Yes:   Climax: not painful  Marinoff Scale: 0/3  PREGNANCY Vaginal deliveries 2 Tearing No C-section deliveries 2 Currently pregnant No  PROLAPSE None    OBJECTIVE:   DIAGNOSTIC FINDINGS:    COGNITION:  Overall cognitive status: Within functional limits for tasks assessed     SENSATION:  Light touch: Appears intact  Proprioception: Appears intact  MUSCLE LENGTH: Bil hamstrings and adductors by 50%  LUMBAR SPECIAL TESTS:  SI Compression/distraction test: Negative, FABER test: Negative, and Gaenslen's test: Negative   GAIT: Distance walked: 300' Assistive device utilized: None Level of assistance: Modified independence Comments: shuffled,                POSTURE: rounded shoulders, forward head, and posterior pelvic tilt   LUMBARAROM/PROM  A/PROM A/PROM  eval  Flexion Limited by 75%  Extension Limited by 25%  Right lateral flexion Limited by 75%  Left  lateral flexion Limited by 75%  Right rotation Limited by 50%  Left rotation Limited by 50%   (Blank rows = not tested)  LOWER EXTREMITY ROM:  WFL but with pain at bil hip abduction and extension  LOWER EXTREMITY MMT:  Bil hips 3+/5 grossly; knees 4/5 and ankles 5/5   PALPATION:   General  TTP at all quadrants of abdomen with fascial restrictions noted as well though Rt side worse than LT. TTP at L4-S1, midline sacrum, coccyx very painful with pt reporting  she feels this is where her pain starts and then radiates in bil directions to mid gluteals.                 External Perineal Exam pt deferred and would like to think about it for next session                              Internal Pelvic Floor pt deferred and would like to think about it for next session   Patient confirms identification and approves PT to assess internal pelvic floor and treatment No  PELVIC MMT:   MMT eval  Vaginal   Internal Anal Sphincter   External Anal Sphincter   Puborectalis   Diastasis Recti   (Blank rows = not tested)        TONE: pt deferred and would like to think about it for next session   PROLAPSE: pt deferred and would like to think about it for next session   TODAY'S TREATMENT  EVAL Examination completed, findings reviewed, pt educated on POC, HEP. Pt motivated to participate in PT and agreeable to attempt recommendations.     PATIENT EDUCATION:  Education details: [email or web address] Person educated: Patient Education method: Explanation, Demonstration, Tactile cues, Verbal cues, and Handouts Education comprehension: verbalized understanding and returned demonstration   HOME EXERCISE PROGRAM: Access Code: [email or web address] URL: [email or web address] Date: 02/25/2022 Prepared by: [facility]  Exercises - Hip Hinge Rock Back  - 1 x daily - 7 x weekly - 1 sets - 10 reps - Tail Wag  - 1 x daily - 7 x weekly - 1 sets - 10 reps - Seated  Happy Baby With Trunk Flexion For Pelvic Relaxation  - 1 x daily - 7 x weekly - 1 sets - 3 reps - 30s holds - Unilateral Supported Supine Butterfly Stretch  - 1 x daily - 7 x weekly - 1 sets - 3 reps - 30s holds  ASSESSMENT:  CLINICAL IMPRESSION: Patient is a 76 y.o. female  who was seen today for physical therapy evaluation and treatment for low back pain lasting at least one year that radiates to bil mid gluteals. Pt reports pain has drastically limited her mobility and caused a decline in her QOL. Pt used to be very active, no mobility restrictions with parkinsons disease, worked out multiple times a week and able to walk 2 miles per day. Pt reports she is unable and fearful to do any of these tasks now due to pain and fear of falling. Pt presents with deficits in gait, posture, and limited in flexibility in spine, bil hips, decreased bil hip strength and core strength, TTP at abdomen in all quadrants with fascial restrictions. Pt had several questions about pelvic floor therapy, all answered and pt agreed to think about internal assessment for next session. Pt motivated to attempt mobility and strengthening to help with back pain, also with noted coccyx pain, pt interested in learning more about pelvic floor therapy and internal work as needed but did decline at eval. Pt would benefit from additional PT to further address deficits.     OBJECTIVE IMPAIRMENTS decreased activity tolerance, decreased balance, decreased coordination, decreased endurance, decreased mobility, difficulty walking, decreased strength, increased fascial restrictions, increased muscle spasms, impaired flexibility, improper body mechanics, postural dysfunction, and pain.   ACTIVITY LIMITATIONS carrying, lifting, standing, stairs, and locomotion level  PARTICIPATION LIMITATIONS: meal prep, cleaning, laundry, community  activity, and yard work  PERSONAL FACTORS Age, Past/current experiences, Time since onset of  injury/illness/exacerbation, and 1 comorbidity: medical history  are also affecting patient's functional outcome.   REHAB POTENTIAL: Good  CLINICAL DECISION MAKING: Evolving/moderate complexity  EVALUATION COMPLEXITY: Moderate   GOALS: Goals reviewed with patient? Yes  SHORT TERM GOALS: Target date: 03/25/2022  Pt to be I with HEP.  Baseline: Goal status: INITIAL  2.  Pt will report 25% reduction of pain due to improvements in posture, strength, and muscle length  Baseline:  Goal status: INITIAL  3.  Pt to be able to perform 10 STS with minimal use of bil UE without LOB or sway due to improved strength and tolerance to activity. Baseline:  Goal status: INITIAL  4.  Pt to demonstrate improved gait mechanics with midline upright posture, increased step height and improved cadence for at least 75' to decrease fall risk and improve safety with ambulating to/from bathroom.  Baseline:  Goal status: INITIAL    LONG TERM GOALS: Target date:  05/28/22    Pt to be I with advanced HEP.  Baseline:  Goal status: INITIAL  2.  Pt will report 50% reduction of pain due to improvements in posture, strength, and muscle length  Baseline:  Goal status: INITIAL  3.  Pt to demonstrate improved gait mechanics with midline upright posture, increased step height and improved cadence for at least 200' to decrease fall risk and improve safety with ambulating to/from bathroom.  Baseline:  Goal status: INITIAL  4.  Pt to demonstrate at least 5/5 bil hip strength for improved pelvic stability and functional squats without leakage.  Baseline:  Goal status: INITIAL  5.  Pt to demonstrate at least 4/5 pelvic floor strength with ability to fully relax between contractions for improved pelvic stability  and decreased strain at pelvic floor Baseline:  Goal status: INITIAL  6.  Pt to be I with bowel and bladder voiding techniques to decrease need to strain and feel fully empty. Baseline:  Goal  status: INITIAL  PLAN: PT FREQUENCY: 1x/week  PT DURATION:  8 sessions  PLANNED INTERVENTIONS: Therapeutic exercises, Therapeutic activity, Neuromuscular re-education, Balance training, Gait training, Patient/Family education, Self Care, Joint mobilization, Aquatic Therapy, Dry Needling, Spinal mobilization, Cryotherapy, Moist heat, Manual lymph drainage, scar mobilization, Taping, Biofeedback, and Manual therapy  PLAN FOR NEXT SESSION: internal if needed and pt consents, breathing mechanics, voiding mechanics as needed, abdominal/hip/spine stretching and strengthening  Stacy Gardner, PT, DPT 07/18/235:00 PM

## 2022-02-26 NOTE — Telephone Encounter (Signed)
Spoke to patient to follow up from call yesterday. She states she checked the area this morning and she noticed only 1 bump vs.3 from yesterday. She states still no pain/bleeding/fever/itching.   Advised she can use warm compresses. She is aware to call for an exam if anything get worse.  She voiced an understanding and was thankful for the follow up call.   Reminded her of her follow up appointment with Berline Lopes on 04/21/22 @ 3:00pm

## 2022-02-28 ENCOUNTER — Ambulatory Visit
Admission: RE | Admit: 2022-02-28 | Discharge: 2022-02-28 | Disposition: A | Discharge status: Home / Self Care | Payer: PPO | Source: Ambulatory Visit | Attending: Neurosurgery | Admitting: Neurosurgery

## 2022-02-28 ENCOUNTER — Other Ambulatory Visit: Payer: Self-pay | Admitting: Neurosurgery

## 2022-02-28 DIAGNOSIS — M533 Sacrococcygeal disorders, not elsewhere classified: Secondary | ICD-10-CM

## 2022-02-28 DIAGNOSIS — M47817 Spondylosis without myelopathy or radiculopathy, lumbosacral region: Secondary | ICD-10-CM | POA: Diagnosis not present

## 2022-02-28 MED ORDER — IOPAMIDOL (ISOVUE-M 200) INJECTION 41%
1.0000 mL | Freq: Once | INTRAMUSCULAR | Status: AC
  Administered 2022-02-28: 1 mL via EPIDURAL

## 2022-02-28 MED ORDER — METHYLPREDNISOLONE ACETATE 40 MG/ML INJ SUSP (RADIOLOG
80.0000 mg | Freq: Once | INTRAMUSCULAR | Status: AC
  Administered 2022-02-28: 80 mg via EPIDURAL

## 2022-02-28 NOTE — Discharge Instructions (Signed)

## 2022-03-03 ENCOUNTER — Ambulatory Visit: Payer: PPO | Admitting: Neurology

## 2022-03-03 ENCOUNTER — Telehealth: Payer: Self-pay | Admitting: Neurology

## 2022-03-03 ENCOUNTER — Encounter: Payer: Self-pay | Admitting: Neurology

## 2022-03-03 VITALS — BP 115/72 | HR 89 | Ht 60.0 in | Wt 132.8 lb

## 2022-03-03 DIAGNOSIS — F4323 Adjustment disorder with mixed anxiety and depressed mood: Secondary | ICD-10-CM | POA: Diagnosis not present

## 2022-03-03 DIAGNOSIS — F439 Reaction to severe stress, unspecified: Secondary | ICD-10-CM | POA: Diagnosis not present

## 2022-03-03 DIAGNOSIS — G20 Parkinson's disease: Secondary | ICD-10-CM | POA: Diagnosis not present

## 2022-03-03 DIAGNOSIS — K5909 Other constipation: Secondary | ICD-10-CM

## 2022-03-03 MED ORDER — CARBIDOPA-LEVODOPA 25-100 MG PO TABS: ORAL_TABLET | ORAL | 2 refills | Status: DC

## 2022-03-03 NOTE — Telephone Encounter (Signed)
Pt said, due to increase in dosage at last office visit, will run out of medication next week. Will need a refill of carbidopa-levodopa (SINEMET IR) 25-100 MG tablet on [id number]

## 2022-03-03 NOTE — Telephone Encounter (Signed)
Pt needs 675 tablets per 90 days instead of 630. Change has been made in chart and sent to [id number].

## 2022-03-03 NOTE — Patient Instructions (Signed)
It was nice to see you again today.  Please hang in there!   As discussed, please take your levodopa 1-1/2 pills 5 times a day starting at 7 AM, 4 hourly.  Last dose will be around 11 PM.

## 2022-03-03 NOTE — Progress Notes (Signed)
Subjective:    Patient ID: Ashley Neal is a 76 y.o. female.  HPI    Interim history:   Ashley Neal is a 76 year old right-handed woman with an underlying medical history of hypertension, hyperlipidemia, history of colitis, osteoarthritis, osteoporosis, reflux disease, anxiety, overweight state and recent diagnosis of endometrial cancer with status post robotic assisted total hysterectomy and bilateral salpingo-oophorectomy on 10/08/2021 as well as robotic assisted lymph node dissection and para-aortic lymphadenectomy on 11/06/2021, and adjuvant radiation therapy, who presents for follow-up consultation of her Parkinson's disease.  The patient is unaccompanied today.  I last saw her on 12/02/2021, at which time she reported increase in her tremor, her stress level had understandably increased.  She was taking levodopa 5 hourly.  I suggested we increase the dose slightly to 1-1/2 pills alternating with 1 pill (4 AM, 7 AM, 11 AM, 3 PM, 7 PM).   Today, 03/03/2022: She reports having had difficulty with constipation, she has difficulty keeping up with the schedule especially the early morning dosing.  I reminded her that we initiated the early morning dose of levodopa because she was awake that early.  She reports that she does not wake up that early now.  She is willing to shift the schedule a little bit to take 1-1/2 pills for all 5 doses and to take the first dose at 7, and keep a 4 hourly schedule with the last dose around 11 now.  She has had increase in stress.  Her husband has stayed in [place].  She reports having had 1 fall in the last week, she fell in a parking garage, her friend was with her, she landed on her buttocks, did not have any injuries but has had back pain.  The back pain actually seem to be just slightly better after the fall per her report, she also had a recent low back injection, has not felt a kick in yet.  She has been taking MiraLAX and Dulcolax for her constipation.  She tries  to hydrate well.   She has had more anxiety symptoms, she takes Xanax sparingly per PCP.  She has been to urgent care for back pain.  She went to [facility] long ED on 01/31/2022 for back pain.  I reviewed the emergency room records.  She received Dilaudid, Zofran, Rocephin, and Decadron.  She was found to have a UTI.  She had a MRI of the pelvis with and without contrast on 01/25/2022 and I reviewed the results:IMPRESSION: 1. No acute findings or clear explanation for the patient's symptoms. 2. Although incompletely visualized, grossly stable partial tear of the left common hamstring tendon. Mild gluteus tendinosis bilaterally. 3. No evidence of sacral fracture or metastatic disease. Mild sacroiliac degenerative changes bilaterally.   The patient's allergies, current medications, family history, past medical history, past social history, past surgical history and problem list were reviewed and updated as appropriate.    Previously:  I saw her on 04/08/2021, at which time she reported dizziness, lack of energy and blood pressure fluctuation.  She was taken off of hydrochlorothiazide because of supine hypertension and her lisinopril was increased.  She reported trying THC Gummies recently which reduced her tremor.  She was advised to use compression socks and stay well-hydrated.  She was advised to continue to stay off of pramipexole as it was for as needed use for restless leg syndrome and she had not used it.  She was advised to continue with Sinemet 1 pill 4 times a day.  She saw Ward Givens, NP in the interim for sooner than scheduled visit on 06/20/2021, at which time she reported worsening symptoms of her Parkinson's disease.  She needed more assistance she had issues with back pain and this decreased her mobility further.  She was using Mirapex 0.125 mg at bedtime at the time.   I saw her on 10/08/2020, at which time she reported blood pressure fluctuations and dizziness associated with low BP  values. She had irregularity on heart auscultation and was advised to FU with PCP and discuss cardiology referral. She was advised to keep her Sinemet at 1 pill qid.      I saw her on 04/02/2020, at which time, she reported worsening tremors and posture was worse.  She had stopped taking the Mirapex because she did not have much in the way of restless leg symptoms anymore.  She was planning a trip to [place] in October 2021.  She was referred to physical therapy and we increased her Sinemet to 1 pill 4 times a day.  She emailed back a week or so later reporting that the Sinemet was helpful at the increased dose.             I saw her on 10/04/2019, at which time she reported Some postural instability.  She felt that she had more tremor.  She had worsening urinary incontinence but constipation was better.  She was encouraged to see urology.  She endorsed some restlessness particularly affecting her feet at night.  She was advised to start low-dose Mirapex particularly for restless leg symptoms.     I saw her on 03/31/2019, at which time, she reported overall doing well, sometimes she had some more gait instability. She was active, but not able to pursue any of her group exercises including cardio drumming.  She had issues with constipation. She had bilateral foot pain, and was supposed to see a podiatrist soon. She was considering CBD oil for arthritis and pain.       I saw her on 09/29/2018, at which time she reported that she tried tai chi but did not enjoy it.  She looked into boxing classes but decided not to pursue it, she did start cardio drumming which she was enjoying as well as chair yoga.  She had occasional symptoms of restless legs.  She felt stable motor wise, she had an occasional flareup in the right hand tremor, rare constipation issues.  She was advised to continue with Sinemet 1 pill 3 times daily.    I saw her on 03/29/2018 at which time she reported doing okay. She was taking  Sinemet 3 times a day but sometimes would forget the midday dose. Is trying to exercise on a regular basis, would go to the gym about 3 days of the week. 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 I suggested she continue with Sinemet 3 times a day.   I saw her on 11/11/2017, at which time she reported diffuse aches and pains. She had seen orthopedics. She had blood work with PCP. She had seen Dr. Carles Collet twice. She was trying to exercise. She was advised to follow-up routinely and advised that we would continue to observe her and follow clinically.   She called in the interim in late April after she had seen rheumatology. She was advised to start Sinemet with gradual titration at the time.   [name] on 04/16/2017 at the request of her primary care physician, at  which time she reported a several month history of intermittent right hand tremors and also additional symptoms including difficulty turning over in bed or certain movements when dancing. I suggested observation and a 6 month follow-up. I did suggest we could proceed with a brain MRI but she declined as she was highly claustrophobic and simple anti-anxiety medication would not help. She had interim appointments with Dr. Carles Collet on 06/09/17 and again on 10/20/17. I reviewed the notes. Potential symptomatic medication was discussed, a PET scan was also discussed.      04/16/2017: (She) reports a several month history of a intermittent right hand tremor. She also reports difficulty with certain movements such as turning over in bed or certain movements when dancing. I reviewed your office note from 04/09/2017, which you kindly included. She reports recent weight loss without particularly trying to lose weight. Blood work through your office from 04/02/2017 was reviewed revealing normal CMP with a glucose level of 105, total cholesterol of 107, triglycerides 171, LDL 126. Recent A1c on 04/09/2017 was borderline at 5.8. CBC with  differential was normal, B12 434. N TSH and N CK level on 04/09/17.  No FHx of PD or ET, father had dementia and lived to be 61, mom died at 50 from metastatic endom cancer. One brother with asthma, 2 sister, younger brother with EtOH abuse, youngest brother died at 14 yo in a car accident.  She has R shoulder problems, has had back pain in the mid back. Has had dexterity issue. She saw ortho at [facility], and has been to [facility] for PT.  She is doing exercises. 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  Has retired recently, 2 grown sons, one of them local, the other in [place].  She is a non-smoker.   Her Past Medical History Is Significant For: Past Medical History:  Diagnosis Date   Anxiety    Depression    GERD (gastroesophageal reflux disease)    Hypertension    Neuromuscular disorder (HCC)    Osteoarthritis    Osteopenia    Parkinson disease ([place])    Pneumonia    Tremors of nervous system    legs   uterine ca 10/08/2021   also endometrial cancer    Her Past Surgical History Is Significant For: Past Surgical History:  Procedure Laterality Date   CARPAL TUNNEL RELEASE Bilateral 1996/1997   CYSTOSCOPY N/A 10/08/2021   Procedure: CYSTOSCOPY;  Surgeon: Lafonda Mosses, MD;  Location: [facility];  Service: Gynecology;  Laterality: N/A;   ROBOTIC ASSISTED TOTAL HYSTERECTOMY WITH BILATERAL SALPINGO OOPHERECTOMY N/A 10/08/2021   Procedure: XI ROBOTIC ASSISTED TOTAL HYSTERECTOMY WITH BILATERAL SALPINGO OOPHORECTOMY;SENTINEL LYMPH NODE INJECTION;  Surgeon: Lafonda Mosses, MD;  Location: [facility];  Service: Gynecology;  Laterality: N/A;   SQUAMOUS CELL CARCINOMA EXCISION Left 2017   Left jawline   TUBAL LIGATION  1980's    Her Family History Is Significant For: Family History  Problem Relation Age of Onset   Endometrial cancer Mother    Diabetes Mother    Alzheimer's disease Father    Coronary artery disease Father    Diabetes Sister    COPD Sister    Diabetes  Sister    Heart disease Brother    Other Brother        MVA    Parkinson's disease Brother    Heart disease Brother    Alcohol abuse Brother    Heart disease Brother    Parkinson's disease Maternal Aunt  Breast cancer Maternal Aunt    Colon cancer Paternal Aunt    Lung cancer Paternal Uncle    Healthy Son    Healthy Son    Prostate cancer Neg Hx    Pancreatic cancer Neg Hx    Ovarian cancer Neg Hx     Her Social History Is Significant For: Social History   Socioeconomic History   Marital status: Married    Spouse name: Not on file   Number of children: Not on file   Years of education: Not on file   Highest education level: Not on file  Occupational History   Occupation: retired    Comment: Chiropractor  Tobacco Use   Smoking status: Never   Smokeless tobacco: Never  Vaping Use   Vaping Use: Never used  Substance and Sexual Activity   Alcohol use: Yes    Comment: ocasiona;   Drug use: Not Currently   Sexual activity: Not Currently  Other Topics Concern   Not on file  Social History Narrative   Not on file   Social Determinants of Health   Financial Resource Strain: Not on file  Food Insecurity: Not on file  Transportation Needs: Not on file  Physical Activity: Not on file  Stress: Not on file  Social Connections: Not on file    Her Allergies Are:  Allergies  Allergen Reactions   Codeine Other (See Comments)    Nausea    Tape Other (See Comments)    Certain tapes pull skin off  :   Her Current Medications Are:  Outpatient Encounter Medications as of 03/03/2022  Medication Sig   ALPRAZolam (XANAX) 0.25 MG tablet Take 0.25 mg by mouth 2 (two) times daily as needed for anxiety.   aspirin EC 81 MG tablet Take 81 mg by mouth daily.   bisacodyl (DULCOLAX) 5 MG EC tablet Take by mouth.   carbidopa-levodopa (SINEMET IR) 25-100 MG tablet 1-1/2 pills at 4 AM, 1 pill at 7 AM, 1-1/2 pills at 11 AM, 1 pill at 3 PM, 1-1/2 pills at 7 PM daily   lisinopril  (ZESTRIL) 10 MG tablet Take 1 tablet (10 mg total) by mouth daily. May take extra 5 mg (half tablet) daily if BP over 160   methocarbamol (ROBAXIN) 750 MG tablet Take 1 tablet (750 mg total) by mouth 3 (three) times daily as needed (muscle spasm/pain).   Multiple Vitamin (MULTIVITAMIN) tablet Take 1 tablet by mouth daily.   polyethylene glycol powder (GLYCOLAX/MIRALAX) 17 GM/SCOOP powder Take 17 g by mouth every other day.   traMADol (ULTRAM) 50 MG tablet Take 1 tablet (50 mg total) by mouth every 6 (six) hours as needed.   [DISCONTINUED] cephALEXin (KEFLEX) 500 MG capsule Take 1 capsule (500 mg total) by mouth 3 (three) times daily. (Patient not taking: Reported on 03/03/2022)   [DISCONTINUED] predniSONE (DELTASONE) 20 MG tablet 3 po once a day for 2 days, then 2 po once a day for 3 days, then 1 po once a day for 3 days (Patient not taking: Reported on 03/03/2022)   No facility-administered encounter medications on file as of 03/03/2022.  :  Review of Systems:  Out of a complete 14 point review of systems, all are reviewed and negative with the exception of these symptoms as listed below:  Review of Systems  Neurological:        Taking her sinemet as ordered.  Hates the early am dose, not sleeping well.  Having a very hard year  Objective:  Neurological Exam  Physical Exam Physical Examination:   Vitals:   03/03/22 1049  BP: 115/72  Pulse: 89    General Examination: The patient is a very pleasant 76 y.o. female in no acute distress. She appears well-developed and well-nourished and well groomed.   HEENT: Normocephalic, atraumatic, pupils are equal, round and reactive to light, corrective eyeglasses in place. Extraocular tracking is mildly impaired.  No nystagmus. Moderate facial masking noted, no dysarthria, no obvious sialorrhea.  Mild to moderate hypophonia.  Neck is moderately rigid.  No carotid bruits.  Airway examination reveals stable findings, tongue protrudes centrally and  palate elevates symmetrically.     Chest, heart, abdomen and extremities:    Chest is clear to auscultation, heart sounds normal, no murmur.      There are prominent arthritic changes in both hands, stable. Puffy ankles but not in the realm of pitting edema, stable.    Skin: Warm and dry. Mild varicose veins in the distal lower extremities and also some spider veins.   Musculoskeletal: Good range of motion, difficulty with hand movements secondary to arthritis.    Neurologically:  Mental status: The patient is awake, alert and oriented in all 4 spheres. Her immediate and remote memory, attention, language skills and fund of knowledge are appropriate. There is no evidence of aphasia, agnosia, apraxia or anomia. Speech is clear with normal prosody and enunciation. Thought process is linear. Mood is mildly depressed appearing, not tearful today.  Cranial nerves II - XII are as described above under HEENT exam. Left shoulder is slightly higher than right, stable. Motor exam: Normal bulk, and strength is noted for age. She has a mild to moderate increase in tone in the right upper extremity, she has a mild  resting tremor in the right upper extremity.  Overall, mild to moderate bradykinesia.     (On 04/16/2017: On Archimedes spiral drawing there is no significant trembling noted. Her handwriting is not tremulous and legible, slightly micrographic.)     Fine motor skills and coordination: She has mild to moderate difficulty with hand movements and finger taps, fine motor skills over all mild to moderately impaired on the right upper and lower extremity and better on the left.  Cerebellar testing: No dysmetria or intention tremor on finger to nose testing. There is no truncal or gait ataxia.  Sensory exam: intact to light touch in the upper and lower extremities.  Gait, station and balance: She stands with mild difficulty and pushes herself up.   Posture is mild to moderately stooped, with increase in  lumbar kyphosis, she walks with decreased stride length and pace, mild shuffling.  She has decreased arm swing bilaterally.  She turns slowly.   Assessment and Plan:    In summary, BREYON BLASS is a very pleasant 76 year old female with an underlying medical history of hypertension, hyperlipidemia, history of colitis, osteoarthritis, osteoporosis, reflux disease, anxiety, overweight state and recent diagnosis of endometrial cancer with status post robotic assisted total hysterectomy and bilateral salpingo-oophorectomy on 10/08/2021 as well as robotic assisted lymph node dissection and para-aortic lymphadenectomy on 11/06/2021, and adjuvant radiation therapy, who presents for follow-up consultation of her Parkinson's disease.  She has had worsening symptoms including mobility issues and worsening tremor.  She has had more stress what with her cancer diagnosis.  She has generally benefited from Sinemet which we increased to 1 pill 4 times a day in August 2021.  Unfortunately, she has been struggling with dizziness and has  had orthostatic hypotension in the past.  We increased her levodopa to 1-1/2 pills alternating with 1 pill for total of 5 doses in April 2023.  She does not want to take her first dose at early.  She is advised to take 1-1/2 pills 5 times a day starting at 7 AM 4 hourly.  Last dose will be around 11 PM.  She is advised to follow-up to see the nurse practitioner in about 3 to 4 months.  I answered all her questions today and she was in agreement.  I spent 30 minutes in total face-to-face time and in reviewing records during pre-charting, more than 50% of which was spent in counseling and coordination of care, reviewing test results, reviewing medications and treatment regimen and/or in discussing or reviewing the diagnosis of PD, the prognosis and treatment options. Pertinent laboratory and imaging test results that were available during this visit with the patient were reviewed by me and  considered in my medical decision making (see chart for details).

## 2022-03-04 DIAGNOSIS — Z85828 Personal history of other malignant neoplasm of skin: Secondary | ICD-10-CM | POA: Diagnosis not present

## 2022-03-04 DIAGNOSIS — C44319 Basal cell carcinoma of skin of other parts of face: Secondary | ICD-10-CM | POA: Diagnosis not present

## 2022-03-06 ENCOUNTER — Ambulatory Visit: Payer: PPO | Admitting: Physical Therapy

## 2022-03-06 DIAGNOSIS — M533 Sacrococcygeal disorders, not elsewhere classified: Secondary | ICD-10-CM | POA: Diagnosis not present

## 2022-03-06 DIAGNOSIS — R279 Unspecified lack of coordination: Secondary | ICD-10-CM

## 2022-03-06 DIAGNOSIS — M6281 Muscle weakness (generalized): Secondary | ICD-10-CM

## 2022-03-06 DIAGNOSIS — R293 Abnormal posture: Secondary | ICD-10-CM

## 2022-03-06 NOTE — Therapy (Signed)
OUTPATIENT PHYSICAL THERAPY FEMALE PELVIC EVALUATION   Patient Name: Ashley Neal MRN: [id number] DOB:1945/09/14, 76 y.o., female Today's Date: 03/06/2022   PT End of Session - 03/06/22 1151     Visit Number 2    Date for PT Re-Evaluation 05/28/22    Authorization Type healthstream    PT Start Time 1145    PT Stop Time 1230    PT Time Calculation (min) 45 min    Activity Tolerance Patient limited by pain    Behavior During Therapy [facility] for tasks assessed/performed             Past Medical History:  Diagnosis Date   Anxiety    Depression    GERD (gastroesophageal reflux disease)    Hypertension    Neuromuscular disorder ([place])    Osteoarthritis    Osteopenia    Parkinson disease ([place])    Pneumonia    Tremors of nervous system    legs   uterine ca 10/08/2021   also endometrial cancer   Past Surgical History:  Procedure Laterality Date   CARPAL TUNNEL RELEASE Bilateral 1996/1997   CYSTOSCOPY N/A 10/08/2021   Procedure: CYSTOSCOPY;  Surgeon: Lafonda Mosses, MD;  Location: [facility];  Service: Gynecology;  Laterality: N/A;   ROBOTIC ASSISTED TOTAL HYSTERECTOMY WITH BILATERAL SALPINGO OOPHERECTOMY N/A 10/08/2021   Procedure: XI ROBOTIC ASSISTED TOTAL HYSTERECTOMY WITH BILATERAL SALPINGO OOPHORECTOMY;SENTINEL LYMPH NODE INJECTION;  Surgeon: Lafonda Mosses, MD;  Location: [facility];  Service: Gynecology;  Laterality: N/A;   SQUAMOUS CELL CARCINOMA EXCISION Left 2017   Left jawline   TUBAL LIGATION  1980's   Patient Active Problem List   Diagnosis Date Noted   Endometrial cancer ([place]) 10/16/2021   Complex atypical endometrial hyperplasia 09/27/2021   Primary osteoarthritis of both hands 05/14/2020   Primary osteoarthritis of both feet 05/14/2020   Age-related osteoporosis without current pathological fracture 05/14/2020   Prediabetes 05/14/2020   Essential hypertension 05/14/2020   History of hyperlipidemia 05/14/2020   History of squamous cell carcinoma  05/14/2020   Parkinsonism ([place]) 05/14/2020    PCP: not listed in chart  REFERRING PROVIDER: Faustino Congress, NP  REFERRING DIAG: M53.3 (ICD-10-CM) - Sacrococcygeal disorders, not elsewhere classified R32 (ICD-10-CM) - Unspecified urinary incontinence  THERAPY DIAG:  Muscle weakness (generalized)  Unspecified lack of coordination  Abnormal posture  Rationale for Evaluation and Treatment Rehabilitation  ONSET DATE: 1 year ago  SUBJECTIVE:  SUBJECTIVE STATEMENT: Pt had skin surgery on Rt side of forehead which went well, still bandaged at visit today. Pt also reports she had shots in back but these didn't really help pain levels. Pt hasn't had any leakage but does have urgency with certain movements (such as changing shirts mostly per pt). Pt reports her UTI has cleared and urgency is mostly better now.   Fluid intake: "I drink a lot of water all day"   PAIN:  Are you having pain? Yes NPRS scale: 5/10 Pain location:  posterior at sacrum and coccyx and low back  Pain type: burning and sharp Pain description: constant   Aggravating factors: sitting, any pressure at low back Relieving factors: resting at sidelying or prone  PRECAUTIONS: Fall  WEIGHT BEARING RESTRICTIONS No  FALLS:  Has patient fallen in last 6 months? Yes. Number of falls 1  LIVING ENVIRONMENT: Lives with: lives alone Lives in: House/apartment Stairs: Yes: External: 1 steps; on left going up Has following equipment at home: None  OCCUPATION: retired  PLOF: Independent  PATIENT GOALS to have less pain  PERTINENT HISTORY:  ROBOTIC ASSISTED PELVIC LYMPH NODE DISSECTION, PARA-AORTIC LYMPHADENECTOMY, CYSTOSCOPY 11/06/21 due to endometrial CA;robotic assisted total hysterectomy and bilateral salpingo-oophorectomy on  10/08/21; parkinsons, hypertension, hyperlipidemia, history of colitis, osteoarthritis, osteoporosis, reflux disease, anxiety, overweight  currently in radiation, allergic to tape MRI findinds 08/31/21: incidental finding of CA, osteoarthritis of bilateral hips.large high-grade partial-thickness tear of the left hamstring origin.  Partial-thickness tear of the left gluteus medius tendon insertion.  Mild osteoarthritis of bilateral SI joints also noted. Sexual abuse: No  BOWEL MOVEMENT Pain with bowel movement: No Type of bowel movement:Type (Bristol Stool Scale) 2-5, Frequency every third day, and Strain Yes Fully empty rectum: No Leakage: No Pads: No Fiber supplement: Yes: miralax, ducalax  URINATION Pain with urination: No Fully empty bladder: No has had recent UTIs that limits this Stream: Strong Urgency: Yes:   Frequency: every 2 hours Leakage:  none Pads: No  INTERCOURSE Pain with intercourse:  not painful Ability to have vaginal penetration:  Yes:   Climax: not painful  Marinoff Scale: 0/3  PREGNANCY Vaginal deliveries 2 Tearing No C-section deliveries 2 Currently pregnant No  PROLAPSE None    OBJECTIVE:   DIAGNOSTIC FINDINGS:    COGNITION:  Overall cognitive status: Within functional limits for tasks assessed     SENSATION:  Light touch: Appears intact  Proprioception: Appears intact  MUSCLE LENGTH: Bil hamstrings and adductors by 50%  LUMBAR SPECIAL TESTS:  SI Compression/distraction test: Negative, FABER test: Negative, and Gaenslen's test: Negative   GAIT: Distance walked: 300' Assistive device utilized: None Level of assistance: Modified independence Comments: shuffled,                POSTURE: rounded shoulders, forward head, and posterior pelvic tilt   LUMBARAROM/PROM  A/PROM A/PROM  eval  Flexion Limited by 75%  Extension Limited by 25%  Right lateral flexion Limited by 75%  Left lateral flexion Limited by 75%  Right rotation  Limited by 50%  Left rotation Limited by 50%   (Blank rows = not tested)  LOWER EXTREMITY ROM:  WFL but with pain at bil hip abduction and extension  LOWER EXTREMITY MMT:  Bil hips 3+/5 grossly; knees 4/5 and ankles 5/5   PALPATION:   General  TTP at all quadrants of abdomen with fascial restrictions noted as well though Rt side worse than LT. TTP at L4-S1, midline sacrum, coccyx very painful with pt  reporting she feels this is where her pain starts and then radiates in bil directions to mid gluteals.                 External Perineal Exam pt deferred and would like to think about it for next session                              Internal Pelvic Floor pt deferred and would like to think about it for next session   Patient confirms identification and approves PT to assess internal pelvic floor and treatment No (at eval)  Yes 03/06/22   PELVIC MMT:   MMT 03/06/22   Vaginal 3/5; 8s; 5 reps  Internal Anal Sphincter   External Anal Sphincter   Puborectalis   Diastasis Recti   (Blank rows = not tested)        TONE: pt deferred and would like to think about it for next session   PROLAPSE: pt deferred and would like to think about it for next session   TODAY'S TREATMENT  03/06/2022:  No emotional/communication barriers or cognitive limitation. Patient is motivated to learn. Patient understands and agrees with treatment goals and plan. PT explains patient will be examined in standing, sitting, and lying down to see how their muscles and joints work. When they are ready, they will be asked to remove their underwear so PT can examine their perineum. The patient is also given the option of providing their own chaperone as one is not provided in our facility. The patient also has the right and is explained the right to defer or refuse any part of the evaluation or treatment including the internal exam. With the patient's consent, PT will use one gloved finger to gently assess the muscles of  the pelvic floor, seeing how well it contracts and relaxes and if there is muscle symmetry. After, the patient will get dressed and PT and patient will discuss exam findings and plan of care. PT and patient discuss plan of care, schedule, attendance policy and HEP activities.  Pt consented to internal vaginal assessment this date and found to have increased tension at superficial muscle layers with pt reporting no pain but could feel tightness with palpation. At deeper muscle layers, toward coccyx pt reported more "deep ache" than tightness or pain and reports she felt it at tailbone. Pt does have coccyx pain and found at eval to have TTP at coccyx. Pt reports she does still use vaginal dilator given by MD after radiation 2x per week and has no pain from this but only has one size so unable to gradually increase. Mild trigger points felt at iliococcygeus and pubococcygeus toward coccyx. Did not fully release but pt felt slight decrease in tightness with gentle over pressure.  Pt educated on findings of internal, continuing HEP, diaphragmatic breathing and pelvic relaxation techniques     PATIENT EDUCATION:  Education details: [email or web address], findings of internal, continuing HEP, diaphragmatic breathing and pelvic relaxation techniques   Person educated: Patient Education method: Explanation, Demonstration, Tactile cues, Verbal cues, and Handouts Education comprehension: verbalized understanding and returned demonstration   HOME EXERCISE PROGRAM: Access Code: [email or web address]  ASSESSMENT:  CLINICAL IMPRESSION: Patient session focused on internal vaginal assessment, findings and treatment above. Pt tolerated well, does have tension and trigger points internally superficial and deep layer of pelvic floor muscles. Pt educated on these findings, HEP, and pelvic relaxation techniques.  Pt would benefit from  additional PT to further address deficits.     OBJECTIVE IMPAIRMENTS decreased activity tolerance, decreased  balance, decreased coordination, decreased endurance, decreased mobility, difficulty walking, decreased strength, increased fascial restrictions, increased muscle spasms, impaired flexibility, improper body mechanics, postural dysfunction, and pain.   ACTIVITY LIMITATIONS carrying, lifting, standing, stairs, and locomotion level  PARTICIPATION LIMITATIONS: meal prep, cleaning, laundry, community activity, and yard work  PERSONAL FACTORS Age, Past/current experiences, Time since onset of injury/illness/exacerbation, and 1 comorbidity: medical history  are also affecting patient's functional outcome.   REHAB POTENTIAL: Good  CLINICAL DECISION MAKING: Evolving/moderate complexity  EVALUATION COMPLEXITY: Moderate   GOALS: Goals reviewed with patient? Yes  SHORT TERM GOALS: Target date: 03/25/2022  Pt to be I with HEP.  Baseline: Goal status: INITIAL  2.  Pt will report 25% reduction of pain due to improvements in posture, strength, and muscle length  Baseline:  Goal status: INITIAL  3.  Pt to be able to perform 10 STS with minimal use of bil UE without LOB or sway due to improved strength and tolerance to activity. Baseline:  Goal status: INITIAL  4.  Pt to demonstrate improved gait mechanics with midline upright posture, increased step height and improved cadence for at least 75' to decrease fall risk and improve safety with ambulating to/from bathroom.  Baseline:  Goal status: INITIAL    LONG TERM GOALS: Target date:  05/28/22    Pt to be I with advanced HEP.  Baseline:  Goal status: INITIAL  2.  Pt will report 50% reduction of pain due to improvements in posture, strength, and muscle length  Baseline:  Goal status: INITIAL  3.  Pt to demonstrate improved gait mechanics with midline upright posture, increased step height and improved cadence for at least 200' to decrease fall risk and improve safety with ambulating to/from bathroom.  Baseline:  Goal status:  INITIAL  4.  Pt to demonstrate at least 5/5 bil hip strength for improved pelvic stability and functional squats without leakage.  Baseline:  Goal status: INITIAL  5.  Pt to demonstrate at least 4/5 pelvic floor strength with ability to fully relax between contractions for improved pelvic stability  and decreased strain at pelvic floor Baseline:  Goal status: INITIAL  6.  Pt to be I with bowel and bladder voiding techniques to decrease need to strain and feel fully empty. Baseline:  Goal status: INITIAL  PLAN: PT FREQUENCY: 1x/week  PT DURATION:  8 sessions  PLANNED INTERVENTIONS: Therapeutic exercises, Therapeutic activity, Neuromuscular re-education, Balance training, Gait training, Patient/Family education, Self Care, Joint mobilization, Aquatic Therapy, Dry Needling, Spinal mobilization, Cryotherapy, Moist heat, Manual lymph drainage, scar mobilization, Taping, Biofeedback, and Manual therapy  PLAN FOR NEXT SESSION: internal if needed and pt consents, breathing mechanics, voiding mechanics as needed, abdominal/hip/spine stretching and strengthening  Stacy Gardner, PT, DPT 07/27/231:14 PM

## 2022-03-06 NOTE — Patient Instructions (Signed)
Moisturizers They are used in the vagina to hydrate the mucous membrane that make up the vaginal canal. Designed to keep a more normal acid balance (ph) Once placed in the vagina, it will last between two to three days.  Use 2-3 times per week at bedtime  Ingredients to avoid is glycerin and fragrance, can increase chance of infection Should not be used just before sex due to causing irritation Most are gels administered either in a tampon-shaped applicator or as a vaginal suppository. They are non-hormonal.   Types of Moisturizers(internal use)  Vitamin E vaginal suppositories- Whole foods, Amazon Moist Again Coconut oil- can break down condoms Julva- (Do no use if on Tamoxifen) amazon Yes moisturizer- amazon NeuEve Silk , NeuEve Silver for menopausal or over 65 (if have severe vaginal atrophy or cancer treatments use NeuEve Silk for  1 month than move to [employer])- [employer], [place].com Olive and Bee intimate cream- www.oliveandbee.com.au Mae vaginal moisturizer- Amazon Aloe Good Clean Love Hyaluronic acid Hyalofemme replens   Creams to use externally on the Vulva area [employer] (good for for cancer patients that had radiation to the area)- [place] or [employer].FlyingBasics.com.br V-magic cream - amazon Julva-amazon Vital "V [place] salve ( help moisturize and help with thinning vulvar area, does have [place] by Irwin Brakeman labial moisturizer (Amazon,  Coconut or olive oil aloe Good Clean Love Enchanted Rose by intimate rose  Things to avoid in the vaginal area Do not use things to irritate the vulvar area No lotions just specialized creams for the vulva area- Neogyn, V-magic, No soaps; can use Aveeno or Calendula cleanser if needed. Must be gentle No deodorants No douches Good to sleep without underwear to let the vaginal area to air out No scrubbing: spread the lips to let warm water rinse  over labias and pat dry

## 2022-03-07 DIAGNOSIS — F4323 Adjustment disorder with mixed anxiety and depressed mood: Secondary | ICD-10-CM | POA: Diagnosis not present

## 2022-03-13 ENCOUNTER — Ambulatory Visit: Payer: PPO | Attending: Family Medicine | Admitting: Physical Therapy

## 2022-03-13 DIAGNOSIS — M6281 Muscle weakness (generalized): Secondary | ICD-10-CM | POA: Insufficient documentation

## 2022-03-13 DIAGNOSIS — R279 Unspecified lack of coordination: Secondary | ICD-10-CM | POA: Diagnosis not present

## 2022-03-13 DIAGNOSIS — M62838 Other muscle spasm: Secondary | ICD-10-CM | POA: Diagnosis not present

## 2022-03-13 DIAGNOSIS — M5459 Other low back pain: Secondary | ICD-10-CM | POA: Diagnosis not present

## 2022-03-13 DIAGNOSIS — R269 Unspecified abnormalities of gait and mobility: Secondary | ICD-10-CM | POA: Diagnosis not present

## 2022-03-13 DIAGNOSIS — R293 Abnormal posture: Secondary | ICD-10-CM | POA: Insufficient documentation

## 2022-03-13 NOTE — Therapy (Signed)
OUTPATIENT PHYSICAL THERAPY FEMALE PELVIC EVALUATION   Patient Name: Ashley Neal MRN: [id number] DOB:1946-04-01, 76 y.o., female Today's Date: 03/13/2022   PT End of Session - 03/13/22 1235     Visit Number 3    Date for PT Re-Evaluation 05/28/22    Authorization Type healthstream    PT Start Time 1145    PT Stop Time 1229    PT Time Calculation (min) 44 min    Activity Tolerance Patient limited by pain    Behavior During Therapy [facility] for tasks assessed/performed              Past Medical History:  Diagnosis Date   Anxiety    Depression    GERD (gastroesophageal reflux disease)    Hypertension    Neuromuscular disorder ([place])    Osteoarthritis    Osteopenia    Parkinson disease ([place])    Pneumonia    Tremors of nervous system    legs   uterine ca 10/08/2021   also endometrial cancer   Past Surgical History:  Procedure Laterality Date   CARPAL TUNNEL RELEASE Bilateral 1996/1997   CYSTOSCOPY N/A 10/08/2021   Procedure: CYSTOSCOPY;  Surgeon: Lafonda Mosses, MD;  Location: [facility];  Service: Gynecology;  Laterality: N/A;   ROBOTIC ASSISTED TOTAL HYSTERECTOMY WITH BILATERAL SALPINGO OOPHERECTOMY N/A 10/08/2021   Procedure: XI ROBOTIC ASSISTED TOTAL HYSTERECTOMY WITH BILATERAL SALPINGO OOPHORECTOMY;SENTINEL LYMPH NODE INJECTION;  Surgeon: Lafonda Mosses, MD;  Location: [facility];  Service: Gynecology;  Laterality: N/A;   SQUAMOUS CELL CARCINOMA EXCISION Left 2017   Left jawline   TUBAL LIGATION  1980's   Patient Active Problem List   Diagnosis Date Noted   Endometrial cancer ([place]) 10/16/2021   Complex atypical endometrial hyperplasia 09/27/2021   Primary osteoarthritis of both hands 05/14/2020   Primary osteoarthritis of both feet 05/14/2020   Age-related osteoporosis without current pathological fracture 05/14/2020   Prediabetes 05/14/2020   Essential hypertension 05/14/2020   History of hyperlipidemia 05/14/2020   History of squamous cell carcinoma  05/14/2020   Parkinsonism ([place]) 05/14/2020    PCP: not listed in chart  REFERRING PROVIDER: Faustino Congress, NP  REFERRING DIAG: M53.3 (ICD-10-CM) - Sacrococcygeal disorders, not elsewhere classified R32 (ICD-10-CM) - Unspecified urinary incontinence  THERAPY DIAG:  Muscle weakness (generalized)  Abnormality of gait and mobility  Abnormal posture  Unspecified lack of coordination  Other muscle spasm  Rationale for Evaluation and Treatment Rehabilitation  ONSET DATE: 1 year ago  SUBJECTIVE:  SUBJECTIVE STATEMENT: Pt presents to clinic today tearful because her back pain has been so bad since yesterday morning. Pt reports she has called her multiple doctors and reports "no one will see me". Pt reports she also has been dizzy with pain but starting to go away now. Pt reports her symptoms with dizziness /lightheadedness since change in Sinemet.   Fluid intake: "I drink a lot of water all day"   PAIN:  Are you having pain? Yes NPRS scale: 8/10 yesterday morning was 10/10.  Pain location:  posterior at sacrum and coccyx and low back  Pain type: burning and sharp Pain description: constant   Aggravating factors: sitting, any pressure at low back Relieving factors: resting at sidelying or prone  PRECAUTIONS: Fall  WEIGHT BEARING RESTRICTIONS No  FALLS:  Has patient fallen in last 6 months? Yes. Number of falls 1  LIVING ENVIRONMENT: Lives with: lives alone Lives in: House/apartment Stairs: Yes: External: 1 steps; on left going up Has following equipment at home: None  OCCUPATION: retired  PLOF: Independent  PATIENT GOALS to have less pain  PERTINENT HISTORY:  ROBOTIC ASSISTED PELVIC LYMPH NODE DISSECTION, PARA-AORTIC LYMPHADENECTOMY, CYSTOSCOPY 11/06/21 due to endometrial  CA;robotic assisted total hysterectomy and bilateral salpingo-oophorectomy on 10/08/21; parkinsons, hypertension, hyperlipidemia, history of colitis, osteoarthritis, osteoporosis, reflux disease, anxiety, overweight  currently in radiation, allergic to tape MRI findinds 08/31/21: incidental finding of CA, osteoarthritis of bilateral hips.large high-grade partial-thickness tear of the left hamstring origin.  Partial-thickness tear of the left gluteus medius tendon insertion.  Mild osteoarthritis of bilateral SI joints also noted. Sexual abuse: No  BOWEL MOVEMENT Pain with bowel movement: No Type of bowel movement:Type (Bristol Stool Scale) 2-5, Frequency every third day, and Strain Yes Fully empty rectum: No Leakage: No Pads: No Fiber supplement: Yes: miralax, ducalax  URINATION Pain with urination: No Fully empty bladder: No has had recent UTIs that limits this Stream: Strong Urgency: Yes:   Frequency: every 2 hours Leakage:  none Pads: No  INTERCOURSE Pain with intercourse:  not painful Ability to have vaginal penetration:  Yes:   Climax: not painful  Marinoff Scale: 0/3  PREGNANCY Vaginal deliveries 2 Tearing No C-section deliveries 2 Currently pregnant No  PROLAPSE None    OBJECTIVE:   DIAGNOSTIC FINDINGS:    COGNITION:  Overall cognitive status: Within functional limits for tasks assessed     SENSATION:  Light touch: Appears intact  Proprioception: Appears intact  MUSCLE LENGTH: Bil hamstrings and adductors by 50%  LUMBAR SPECIAL TESTS:  SI Compression/distraction test: Negative, FABER test: Negative, and Gaenslen's test: Negative   GAIT: Distance walked: 300' Assistive device utilized: None Level of assistance: Modified independence Comments: shuffled,                POSTURE: rounded shoulders, forward head, and posterior pelvic tilt   LUMBARAROM/PROM  A/PROM A/PROM  eval  Flexion Limited by 75%  Extension Limited by 25%  Right lateral  flexion Limited by 75%  Left lateral flexion Limited by 75%  Right rotation Limited by 50%  Left rotation Limited by 50%   (Blank rows = not tested)  LOWER EXTREMITY ROM:  WFL but with pain at bil hip abduction and extension  LOWER EXTREMITY MMT:  Bil hips 3+/5 grossly; knees 4/5 and ankles 5/5   PALPATION:   General  TTP at all quadrants of abdomen with fascial restrictions noted as well though Rt side worse than LT. TTP at L4-S1, midline sacrum, coccyx very painful with pt  reporting she feels this is where her pain starts and then radiates in bil directions to mid gluteals.                 External Perineal Exam pt deferred and would like to think about it for next session                              Internal Pelvic Floor pt deferred and would like to think about it for next session   Patient confirms identification and approves PT to assess internal pelvic floor and treatment No (at eval)  Yes 03/06/22   PELVIC MMT:   MMT 03/06/22   Vaginal 3/5; 8s; 5 reps  Internal Anal Sphincter   External Anal Sphincter   Puborectalis   Diastasis Recti   (Blank rows = not tested)        TONE: pt deferred and would like to think about it for next session   PROLAPSE: pt deferred and would like to think about it for next session   TODAY'S TREATMENT   03/13/2022:  Attempting forward hip hinge stretch for posterior pelvic floor stretching pt reported the stretch felt good at tailbone however after ~20s pt reported feeling dizzy. Recovered in sitting quickly.  2x10 adductor squeezes in sitting Seated hip IR rotation stretching 3x30s TA activation in chair x10  03/06/2022:  No emotional/communication barriers or cognitive limitation. Patient is motivated to learn. Patient understands and agrees with treatment goals and plan. PT explains patient will be examined in standing, sitting, and lying down to see how their muscles and joints work. When they are ready, they will be asked to  remove their underwear so PT can examine their perineum. The patient is also given the option of providing their own chaperone as one is not provided in our facility. The patient also has the right and is explained the right to defer or refuse any part of the evaluation or treatment including the internal exam. With the patient's consent, PT will use one gloved finger to gently assess the muscles of the pelvic floor, seeing how well it contracts and relaxes and if there is muscle symmetry. After, the patient will get dressed and PT and patient will discuss exam findings and plan of care. PT and patient discuss plan of care, schedule, attendance policy and HEP activities.  Pt consented to internal vaginal assessment this date and found to have increased tension at superficial muscle layers with pt reporting no pain but could feel tightness with palpation. At deeper muscle layers, toward coccyx pt reported more "deep ache" than tightness or pain and reports she felt it at tailbone. Pt does have coccyx pain and found at eval to have TTP at coccyx. Pt reports she does still use vaginal dilator given by MD after radiation 2x per week and has no pain from this but only has one size so unable to gradually increase. Mild trigger points felt at iliococcygeus and pubococcygeus toward coccyx. Did not fully release but pt felt slight decrease in tightness with gentle over pressure.  Pt educated on findings of internal, continuing HEP, diaphragmatic breathing and pelvic relaxation techniques     PATIENT EDUCATION:  Education details: VYMKAKYT, findings of internal, continuing HEP, diaphragmatic breathing and pelvic relaxation techniques   Person educated: Patient Education method: Explanation, Demonstration, Tactile cues, Verbal cues, and Handouts Education comprehension: verbalized understanding and returned demonstration   HOME EXERCISE PROGRAM: Access Code:  [email or web address]  ASSESSMENT:  CLINICAL IMPRESSION: Patient  presents today reporting all leakage and frequency symptoms have resolved. Pt is having having pain levels in low back today, reports she has reached out providers and states "no one will see me". Pt states yesterday ws worse but pain still 8/10 today. Pt also states her dizziness and lightheadedness has been worse since increasing Sinemet. PT instructed Pt to let MD know this and pt agreed to do this today. Pt unable to tolerate standing stretch due to mild dizziness and rest of session in sitting, pt denied dizziness. Stretching and TA activation seemed to decrease back pain slightly per pt. Pt very tearful and upset during most of session and benefited from extra time for emotional support and encouraging pt to reach out to providers as needed to relay needs. Pt agreed. Pt tolerated well, does have tension and trigger points internally superficial and deep layer of pelvic floor muscles. Pt educated on these findings, HEP, and pelvic relaxation techniques.  Pt would benefit from additional PT to further address deficits.     OBJECTIVE IMPAIRMENTS decreased activity tolerance, decreased balance, decreased coordination, decreased endurance, decreased mobility, difficulty walking, decreased strength, increased fascial restrictions, increased muscle spasms, impaired flexibility, improper body mechanics, postural dysfunction, and pain.   ACTIVITY LIMITATIONS carrying, lifting, standing, stairs, and locomotion level  PARTICIPATION LIMITATIONS: meal prep, cleaning, laundry, community activity, and yard work  PERSONAL FACTORS Age, Past/current experiences, Time since onset of injury/illness/exacerbation, and 1 comorbidity: medical history  are also affecting patient's functional outcome.   REHAB POTENTIAL: Good  CLINICAL DECISION MAKING: Evolving/moderate complexity  EVALUATION COMPLEXITY: Moderate   GOALS: Goals reviewed with patient? Yes  SHORT TERM GOALS: Target date: 03/25/2022  Pt to be I with  HEP.  Baseline: Goal status: INITIAL  2.  Pt will report 25% reduction of pain due to improvements in posture, strength, and muscle length  Baseline:  Goal status: INITIAL  3.  Pt to be able to perform 10 STS with minimal use of bil UE without LOB or sway due to improved strength and tolerance to activity. Baseline:  Goal status: INITIAL  4.  Pt to demonstrate improved gait mechanics with midline upright posture, increased step height and improved cadence for at least 75' to decrease fall risk and improve safety with ambulating to/from bathroom.  Baseline:  Goal status: INITIAL    LONG TERM GOALS: Target date:  05/28/22    Pt to be I with advanced HEP.  Baseline:  Goal status: INITIAL  2.  Pt will report 50% reduction of pain due to improvements in posture, strength, and muscle length  Baseline:  Goal status: INITIAL  3.  Pt to demonstrate improved gait mechanics with midline upright posture, increased step height and improved cadence for at least 200' to decrease fall risk and improve safety with ambulating to/from bathroom.  Baseline:  Goal status: INITIAL  4.  Pt to demonstrate at least 5/5 bil hip strength for improved pelvic stability and functional squats without leakage.  Baseline:  Goal status: INITIAL  5.  Pt to demonstrate at least 4/5 pelvic floor strength with ability to fully relax between contractions for improved pelvic stability  and decreased strain at pelvic floor Baseline:  Goal status: INITIAL  6.  Pt to be I with bowel and bladder voiding techniques to decrease need to strain and feel fully empty. Baseline:  Goal status: INITIAL  PLAN: PT FREQUENCY: 1x/week  PT DURATION:  8 sessions  PLANNED INTERVENTIONS: Therapeutic  exercises, Therapeutic activity, Neuromuscular re-education, Balance training, Gait training, Patient/Family education, Self Care, Joint mobilization, Aquatic Therapy, Dry Needling, Spinal mobilization, Cryotherapy, Moist heat,  Manual lymph drainage, scar mobilization, Taping, Biofeedback, and Manual therapy  PLAN FOR NEXT SESSION: internal if needed and pt consents, breathing mechanics, voiding mechanics as needed, abdominal/hip/spine stretching and strengthening  Stacy Gardner, PT, DPT 03/14/2311:36 PM

## 2022-03-14 ENCOUNTER — Encounter: Payer: Self-pay | Admitting: Neurology

## 2022-03-14 DIAGNOSIS — G20 Parkinson's disease: Secondary | ICD-10-CM | POA: Diagnosis not present

## 2022-03-14 DIAGNOSIS — M545 Low back pain, unspecified: Secondary | ICD-10-CM | POA: Diagnosis not present

## 2022-03-14 DIAGNOSIS — G8929 Other chronic pain: Secondary | ICD-10-CM | POA: Diagnosis not present

## 2022-03-14 DIAGNOSIS — F329 Major depressive disorder, single episode, unspecified: Secondary | ICD-10-CM | POA: Diagnosis not present

## 2022-03-14 DIAGNOSIS — I951 Orthostatic hypotension: Secondary | ICD-10-CM | POA: Diagnosis not present

## 2022-03-17 DIAGNOSIS — F4323 Adjustment disorder with mixed anxiety and depressed mood: Secondary | ICD-10-CM | POA: Diagnosis not present

## 2022-03-20 ENCOUNTER — Ambulatory Visit: Payer: PPO | Admitting: Physical Therapy

## 2022-03-20 DIAGNOSIS — M6281 Muscle weakness (generalized): Secondary | ICD-10-CM

## 2022-03-20 DIAGNOSIS — M5459 Other low back pain: Secondary | ICD-10-CM

## 2022-03-20 DIAGNOSIS — M62838 Other muscle spasm: Secondary | ICD-10-CM

## 2022-03-20 DIAGNOSIS — R269 Unspecified abnormalities of gait and mobility: Secondary | ICD-10-CM

## 2022-03-20 NOTE — Therapy (Signed)
OUTPATIENT PHYSICAL THERAPY FEMALE PELVIC EVALUATION   Patient Name: Ashley Neal MRN: [id number] DOB:1945/08/16, 76 y.o., female Today's Date: 03/20/2022   PT End of Session - 03/20/22 0938     Visit Number 4    Date for PT Re-Evaluation 05/28/22    Authorization Type healthstream    PT Start Time 0931    PT Stop Time 1013    PT Time Calculation (min) 42 min    Activity Tolerance Patient limited by pain    Behavior During Therapy [facility] for tasks assessed/performed              Past Medical History:  Diagnosis Date   Anxiety    Depression    GERD (gastroesophageal reflux disease)    Hypertension    Neuromuscular disorder ([place])    Osteoarthritis    Osteopenia    Parkinson disease ([place])    Pneumonia    Tremors of nervous system    legs   uterine ca 10/08/2021   also endometrial cancer   Past Surgical History:  Procedure Laterality Date   CARPAL TUNNEL RELEASE Bilateral 1996/1997   CYSTOSCOPY N/A 10/08/2021   Procedure: CYSTOSCOPY;  Surgeon: Lafonda Mosses, MD;  Location: [facility];  Service: Gynecology;  Laterality: N/A;   ROBOTIC ASSISTED TOTAL HYSTERECTOMY WITH BILATERAL SALPINGO OOPHERECTOMY N/A 10/08/2021   Procedure: XI ROBOTIC ASSISTED TOTAL HYSTERECTOMY WITH BILATERAL SALPINGO OOPHORECTOMY;SENTINEL LYMPH NODE INJECTION;  Surgeon: Lafonda Mosses, MD;  Location: [facility];  Service: Gynecology;  Laterality: N/A;   SQUAMOUS CELL CARCINOMA EXCISION Left 2017   Left jawline   TUBAL LIGATION  1980's   Patient Active Problem List   Diagnosis Date Noted   Endometrial cancer ([place]) 10/16/2021   Complex atypical endometrial hyperplasia 09/27/2021   Primary osteoarthritis of both hands 05/14/2020   Primary osteoarthritis of both feet 05/14/2020   Age-related osteoporosis without current pathological fracture 05/14/2020   Prediabetes 05/14/2020   Essential hypertension 05/14/2020   History of hyperlipidemia 05/14/2020   History of squamous cell carcinoma  05/14/2020   Parkinsonism ([place]) 05/14/2020    PCP: not listed in chart  REFERRING PROVIDER: Faustino Congress, NP  REFERRING DIAG: M53.3 (ICD-10-CM) - Sacrococcygeal disorders, not elsewhere classified R32 (ICD-10-CM) - Unspecified urinary incontinence  THERAPY DIAG:  Muscle weakness (generalized)  Abnormality of gait and mobility  Other muscle spasm  Other low back pain  Rationale for Evaluation and Treatment Rehabilitation  ONSET DATE: 1 year ago  SUBJECTIVE:  SUBJECTIVE STATEMENT: Pt in better spirits today and reports she spoke to doctor and they changed her prescription and this has helped with pain and BP. Pt reports her stretches from HEP are much more comfortable for her and she doesn't have pain when doing them.   Fluid intake: "I drink a lot of water all day"   PAIN:  Are you having pain? Yes NPRS scale: 8/10 at start of session Pain location:  posterior at sacrum and coccyx and low back  Pain type: burning and sharp Pain description: constant   Aggravating factors: sitting, any pressure at low back Relieving factors: resting at sidelying or prone  PRECAUTIONS: Fall  WEIGHT BEARING RESTRICTIONS No  FALLS:  Has patient fallen in last 6 months? Yes. Number of falls 1  LIVING ENVIRONMENT: Lives with: lives alone Lives in: House/apartment Stairs: Yes: External: 1 steps; on left going up Has following equipment at home: None  OCCUPATION: retired  PLOF: Independent  PATIENT GOALS to have less pain  PERTINENT HISTORY:  ROBOTIC ASSISTED PELVIC LYMPH NODE DISSECTION, PARA-AORTIC LYMPHADENECTOMY, CYSTOSCOPY 11/06/21 due to endometrial CA;robotic assisted total hysterectomy and bilateral salpingo-oophorectomy on 10/08/21; parkinsons, hypertension, hyperlipidemia, history of  colitis, osteoarthritis, osteoporosis, reflux disease, anxiety, overweight  currently in radiation, allergic to tape MRI findinds 08/31/21: incidental finding of CA, osteoarthritis of bilateral hips.large high-grade partial-thickness tear of the left hamstring origin.  Partial-thickness tear of the left gluteus medius tendon insertion.  Mild osteoarthritis of bilateral SI joints also noted. Sexual abuse: No  BOWEL MOVEMENT Pain with bowel movement: No Type of bowel movement:Type (Bristol Stool Scale) 2-5, Frequency every third day, and Strain Yes Fully empty rectum: No Leakage: No Pads: No Fiber supplement: Yes: miralax, ducalax  URINATION Pain with urination: No Fully empty bladder: No has had recent UTIs that limits this Stream: Strong Urgency: Yes:   Frequency: every 2 hours Leakage:  none Pads: No  INTERCOURSE Pain with intercourse:  not painful Ability to have vaginal penetration:  Yes:   Climax: not painful  Marinoff Scale: 0/3  PREGNANCY Vaginal deliveries 2 Tearing No C-section deliveries 2 Currently pregnant No  PROLAPSE None    OBJECTIVE:   DIAGNOSTIC FINDINGS:    COGNITION:  Overall cognitive status: Within functional limits for tasks assessed     SENSATION:  Light touch: Appears intact  Proprioception: Appears intact  MUSCLE LENGTH: Bil hamstrings and adductors by 50%  LUMBAR SPECIAL TESTS:  SI Compression/distraction test: Negative, FABER test: Negative, and Gaenslen's test: Negative   GAIT: Distance walked: 300' Assistive device utilized: None Level of assistance: Modified independence Comments: shuffled,                POSTURE: rounded shoulders, forward head, and posterior pelvic tilt   LUMBARAROM/PROM  A/PROM A/PROM  eval  Flexion Limited by 75%  Extension Limited by 25%  Right lateral flexion Limited by 75%  Left lateral flexion Limited by 75%  Right rotation Limited by 50%  Left rotation Limited by 50%   (Blank rows = not  tested)  LOWER EXTREMITY ROM:  WFL but with pain at bil hip abduction and extension  LOWER EXTREMITY MMT:  Bil hips 3+/5 grossly; knees 4/5 and ankles 5/5   PALPATION:   General  TTP at all quadrants of abdomen with fascial restrictions noted as well though Rt side worse than LT. TTP at L4-S1, midline sacrum, coccyx very painful with pt reporting she feels this is where her pain starts and then radiates in bil directions  to mid gluteals.                 External Perineal Exam pt deferred and would like to think about it for next session                              Internal Pelvic Floor pt deferred and would like to think about it for next session   Patient confirms identification and approves PT to assess internal pelvic floor and treatment No (at eval)  Yes 03/06/22   PELVIC MMT:   MMT 03/06/22   Vaginal 3/5; 8s; 5 reps  Internal Anal Sphincter   External Anal Sphincter   Puborectalis   Diastasis Recti   (Blank rows = not tested)        TONE: pt deferred and would like to think about it for next session   PROLAPSE: pt deferred and would like to think about it for next session   TODAY'S TREATMENT   03/20/2022: Knee to chest stretch single leg 3x30s Butterfly stretch 3x30s Sitting happy baby 3x30s  Hip IR stretch in sitting 2x30s    03/13/2022:  Attempting forward hip hinge stretch for posterior pelvic floor stretching pt reported the stretch felt good at tailbone however after ~20s pt reported feeling dizzy. Recovered in sitting quickly.  2x10 adductor squeezes in sitting Seated hip IR rotation stretching 3x30s TA activation in chair x10  03/06/2022:  No emotional/communication barriers or cognitive limitation. Patient is motivated to learn. Patient understands and agrees with treatment goals and plan. PT explains patient will be examined in standing, sitting, and lying down to see how their muscles and joints work. When they are ready, they will be asked to remove  their underwear so PT can examine their perineum. The patient is also given the option of providing their own chaperone as one is not provided in our facility. The patient also has the right and is explained the right to defer or refuse any part of the evaluation or treatment including the internal exam. With the patient's consent, PT will use one gloved finger to gently assess the muscles of the pelvic floor, seeing how well it contracts and relaxes and if there is muscle symmetry. After, the patient will get dressed and PT and patient will discuss exam findings and plan of care. PT and patient discuss plan of care, schedule, attendance policy and HEP activities.  Pt consented to internal vaginal assessment this date and found to have increased tension at superficial muscle layers with pt reporting no pain but could feel tightness with palpation. At deeper muscle layers, toward coccyx pt reported more "deep ache" than tightness or pain and reports she felt it at tailbone. Pt does have coccyx pain and found at eval to have TTP at coccyx. Pt reports she does still use vaginal dilator given by MD after radiation 2x per week and has no pain from this but only has one size so unable to gradually increase. Mild trigger points felt at iliococcygeus and pubococcygeus toward coccyx. Did not fully release but pt felt slight decrease in tightness with gentle over pressure.  Pt educated on findings of internal, continuing HEP, diaphragmatic breathing and pelvic relaxation techniques     PATIENT EDUCATION:  Education details: VYMKAKYT, findings of internal, continuing HEP, diaphragmatic breathing and pelvic relaxation techniques   Person educated: Patient Education method: Explanation, Demonstration, Tactile cues, Verbal cues, and Handouts Education comprehension: verbalized understanding  and returned demonstration   HOME EXERCISE PROGRAM: Access Code: [email or web address]  ASSESSMENT:  CLINICAL IMPRESSION: Patient  reports she has felt a little better doctor had changed some prescriptions and it has helped a bit. HEP is more comfortable during stretches and helps pain but returns afterward. Pt session focused on hip and low back stretching today, pt needs extra time to complete all exercises due to rest breaks, extra time to change positions. Pt tolerated well with this extra time and reported pain has slightly improved by end of session.  Pt would benefit from additional PT to further address deficits.     OBJECTIVE IMPAIRMENTS decreased activity tolerance, decreased balance, decreased coordination, decreased endurance, decreased mobility, difficulty walking, decreased strength, increased fascial restrictions, increased muscle spasms, impaired flexibility, improper body mechanics, postural dysfunction, and pain.   ACTIVITY LIMITATIONS carrying, lifting, standing, stairs, and locomotion level  PARTICIPATION LIMITATIONS: meal prep, cleaning, laundry, community activity, and yard work  PERSONAL FACTORS Age, Past/current experiences, Time since onset of injury/illness/exacerbation, and 1 comorbidity: medical history  are also affecting patient's functional outcome.   REHAB POTENTIAL: Good  CLINICAL DECISION MAKING: Evolving/moderate complexity  EVALUATION COMPLEXITY: Moderate   GOALS: Goals reviewed with patient? Yes  SHORT TERM GOALS: Target date: 03/25/2022  Pt to be I with HEP.  Baseline: Goal status: INITIAL  2.  Pt will report 25% reduction of pain due to improvements in posture, strength, and muscle length  Baseline:  Goal status: INITIAL  3.  Pt to be able to perform 10 STS with minimal use of bil UE without LOB or sway due to improved strength and tolerance to activity. Baseline:  Goal status: INITIAL  4.  Pt to demonstrate improved gait mechanics with midline upright posture, increased step height and improved cadence for at least 75' to decrease fall risk and improve safety with  ambulating to/from bathroom.  Baseline:  Goal status: INITIAL    LONG TERM GOALS: Target date:  05/28/22    Pt to be I with advanced HEP.  Baseline:  Goal status: INITIAL  2.  Pt will report 50% reduction of pain due to improvements in posture, strength, and muscle length  Baseline:  Goal status: INITIAL  3.  Pt to demonstrate improved gait mechanics with midline upright posture, increased step height and improved cadence for at least 200' to decrease fall risk and improve safety with ambulating to/from bathroom.  Baseline:  Goal status: INITIAL  4.  Pt to demonstrate at least 5/5 bil hip strength for improved pelvic stability and functional squats without leakage.  Baseline:  Goal status: INITIAL  5.  Pt to demonstrate at least 4/5 pelvic floor strength with ability to fully relax between contractions for improved pelvic stability  and decreased strain at pelvic floor Baseline:  Goal status: INITIAL  6.  Pt to be I with bowel and bladder voiding techniques to decrease need to strain and feel fully empty. Baseline:  Goal status: INITIAL  PLAN: PT FREQUENCY: 1x/week  PT DURATION:  8 sessions  PLANNED INTERVENTIONS: Therapeutic exercises, Therapeutic activity, Neuromuscular re-education, Balance training, Gait training, Patient/Family education, Self Care, Joint mobilization, Aquatic Therapy, Dry Needling, Spinal mobilization, Cryotherapy, Moist heat, Manual lymph drainage, scar mobilization, Taping, Biofeedback, and Manual therapy  PLAN FOR NEXT SESSION: internal if needed and pt consents, breathing mechanics, voiding mechanics as needed, abdominal/hip/spine stretching and strengthening  Stacy Gardner, PT, DPT 03/20/2309:14 AM

## 2022-03-27 ENCOUNTER — Ambulatory Visit: Payer: PPO | Admitting: Physical Therapy

## 2022-03-27 DIAGNOSIS — M6281 Muscle weakness (generalized): Secondary | ICD-10-CM | POA: Diagnosis not present

## 2022-03-27 DIAGNOSIS — R279 Unspecified lack of coordination: Secondary | ICD-10-CM

## 2022-03-27 DIAGNOSIS — R293 Abnormal posture: Secondary | ICD-10-CM

## 2022-03-27 NOTE — Therapy (Signed)
OUTPATIENT PHYSICAL THERAPY FEMALE PELVIC EVALUATION   Patient Name: CLOTINE HEINER MRN: [id number] DOB:Aug 22, 1945, 76 y.o., female Today's Date: 03/27/2022   PT End of Session - 03/27/22 1143     Visit Number 5    Date for PT Re-Evaluation 05/28/22    Authorization Type healthstream    PT Start Time 1145    PT Stop Time 1225    PT Time Calculation (min) 40 min    Activity Tolerance Patient limited by pain    Behavior During Therapy [facility] for tasks assessed/performed              Past Medical History:  Diagnosis Date   Anxiety    Depression    GERD (gastroesophageal reflux disease)    Hypertension    Neuromuscular disorder ([place])    Osteoarthritis    Osteopenia    Parkinson disease ([place])    Pneumonia    Tremors of nervous system    legs   uterine ca 10/08/2021   also endometrial cancer   Past Surgical History:  Procedure Laterality Date   CARPAL TUNNEL RELEASE Bilateral 1996/1997   CYSTOSCOPY N/A 10/08/2021   Procedure: CYSTOSCOPY;  Surgeon: Lafonda Mosses, MD;  Location: [facility];  Service: Gynecology;  Laterality: N/A;   ROBOTIC ASSISTED TOTAL HYSTERECTOMY WITH BILATERAL SALPINGO OOPHERECTOMY N/A 10/08/2021   Procedure: XI ROBOTIC ASSISTED TOTAL HYSTERECTOMY WITH BILATERAL SALPINGO OOPHORECTOMY;SENTINEL LYMPH NODE INJECTION;  Surgeon: Lafonda Mosses, MD;  Location: [facility];  Service: Gynecology;  Laterality: N/A;   SQUAMOUS CELL CARCINOMA EXCISION Left 2017   Left jawline   TUBAL LIGATION  1980's   Patient Active Problem List   Diagnosis Date Noted   Endometrial cancer ([place]) 10/16/2021   Complex atypical endometrial hyperplasia 09/27/2021   Primary osteoarthritis of both hands 05/14/2020   Primary osteoarthritis of both feet 05/14/2020   Age-related osteoporosis without current pathological fracture 05/14/2020   Prediabetes 05/14/2020   Essential hypertension 05/14/2020   History of hyperlipidemia 05/14/2020   History of squamous cell carcinoma  05/14/2020   Parkinsonism ([place]) 05/14/2020    PCP: not listed in chart  REFERRING PROVIDER: Faustino Congress, NP  REFERRING DIAG: M53.3 (ICD-10-CM) - Sacrococcygeal disorders, not elsewhere classified R32 (ICD-10-CM) - Unspecified urinary incontinence  THERAPY DIAG:  Muscle weakness (generalized)  Unspecified lack of coordination  Abnormal posture  Rationale for Evaluation and Treatment Rehabilitation  ONSET DATE: 1 year ago  SUBJECTIVE:  SUBJECTIVE STATEMENT: Pt reports she has been doing HEP and feels like it does help with pain slightly but it does continue. Pt reports she has less burning pain now, now it feels almost itchy and more low back and slightly outward instead of such intense pain at middle of low back.  Fluid intake: "I drink a lot of water all day"   PAIN:  Are you having pain? Yes NPRS scale: 5-7/10 pending how close to time taking medication Pain location:  posterior at sacrum and coccyx and low back  Pain type: burning and sharp Pain description: constant   Aggravating factors: sitting, any pressure at low back Relieving factors: resting at sidelying or prone  PRECAUTIONS: Fall  WEIGHT BEARING RESTRICTIONS No  FALLS:  Has patient fallen in last 6 months? Yes. Number of falls 1  LIVING ENVIRONMENT: Lives with: lives alone Lives in: House/apartment Stairs: Yes: External: 1 steps; on left going up Has following equipment at home: None  OCCUPATION: retired  PLOF: Independent  PATIENT GOALS to have less pain  PERTINENT HISTORY:  ROBOTIC ASSISTED PELVIC LYMPH NODE DISSECTION, PARA-AORTIC LYMPHADENECTOMY, CYSTOSCOPY 11/06/21 due to endometrial CA;robotic assisted total hysterectomy and bilateral salpingo-oophorectomy on 10/08/21; parkinsons, hypertension,  hyperlipidemia, history of colitis, osteoarthritis, osteoporosis, reflux disease, anxiety, overweight  currently in radiation, allergic to tape MRI findinds 08/31/21: incidental finding of CA, osteoarthritis of bilateral hips.large high-grade partial-thickness tear of the left hamstring origin.  Partial-thickness tear of the left gluteus medius tendon insertion.  Mild osteoarthritis of bilateral SI joints also noted. Sexual abuse: No  BOWEL MOVEMENT Pain with bowel movement: No Type of bowel movement:Type (Bristol Stool Scale) 2-5, Frequency every third day, and Strain Yes Fully empty rectum: No Leakage: No Pads: No Fiber supplement: Yes: miralax, ducalax  URINATION Pain with urination: No Fully empty bladder: No has had recent UTIs that limits this Stream: Strong Urgency: Yes:   Frequency: every 2 hours Leakage:  none Pads: No  INTERCOURSE Pain with intercourse:  not painful Ability to have vaginal penetration:  Yes:   Climax: not painful  Marinoff Scale: 0/3  PREGNANCY Vaginal deliveries 2 Tearing No C-section deliveries 2 Currently pregnant No  PROLAPSE None    OBJECTIVE:   DIAGNOSTIC FINDINGS:    COGNITION:  Overall cognitive status: Within functional limits for tasks assessed     SENSATION:  Light touch: Appears intact  Proprioception: Appears intact  MUSCLE LENGTH: Bil hamstrings and adductors by 50%  LUMBAR SPECIAL TESTS:  SI Compression/distraction test: Negative, FABER test: Negative, and Gaenslen's test: Negative   GAIT: Distance walked: 300' Assistive device utilized: None Level of assistance: Modified independence Comments: shuffled,                POSTURE: rounded shoulders, forward head, and posterior pelvic tilt   LUMBARAROM/PROM  A/PROM A/PROM  eval  Flexion Limited by 75%  Extension Limited by 25%  Right lateral flexion Limited by 75%  Left lateral flexion Limited by 75%  Right rotation Limited by 50%  Left rotation Limited  by 50%   (Blank rows = not tested)  LOWER EXTREMITY ROM:  WFL but with pain at bil hip abduction and extension  LOWER EXTREMITY MMT:  Bil hips 3+/5 grossly; knees 4/5 and ankles 5/5   PALPATION:   General  TTP at all quadrants of abdomen with fascial restrictions noted as well though Rt side worse than LT. TTP at L4-S1, midline sacrum, coccyx very painful with pt reporting she feels this is where her  pain starts and then radiates in bil directions to mid gluteals.                 External Perineal Exam pt deferred and would like to think about it for next session                              Internal Pelvic Floor pt deferred and would like to think about it for next session   Patient confirms identification and approves PT to assess internal pelvic floor and treatment No (at eval)  Yes 03/06/22   PELVIC MMT:   MMT 03/06/22   Vaginal 3/5; 8s; 5 reps  Internal Anal Sphincter   External Anal Sphincter   Puborectalis   Diastasis Recti   (Blank rows = not tested)        TONE: pt deferred and would like to think about it for next session   PROLAPSE: pt deferred and would like to think about it for next session   TODAY'S TREATMENT   03/27/2022:  Pt requested moist heat at low back to see if this helps relax her a bit (total time 10 mins) during stretching - x5 layers between heat and skin, pt denied and did not have adverse effects to skin post heat use 2x30s single knee to chest stretch Butterfly stretch 3x30s Sitting happy baby 3x30s  Hip IR stretch in sitting 2x30s X10 ant/post rocking with bil hip IR  03/20/2022: Knee to chest stretch single leg 3x30s Butterfly stretch 3x30s Sitting happy baby 3x30s  Hip IR stretch in sitting 2x30s     PATIENT EDUCATION:  Education details: [email or web address] Person educated: Patient Education method: Consulting civil engineer, Demonstration, Tactile cues, Verbal cues, and Handouts Education comprehension: verbalized understanding and returned  demonstration   HOME EXERCISE PROGRAM: Access Code: [email or web address]  ASSESSMENT:  CLINICAL IMPRESSION: Patient reports she has felt a little better has been keeping up with prescription and has been able to do HEP and thinks this keeps pain a little lower in general, hasn't had high spikes as much as she was and pain is fairly constant 5-7/10. Pt session focused on hip and low back stretching today. Pt tolerated well with this extra time and reported pain has slightly improved by end of session. Pt would benefit from additional PT to further address deficits.     OBJECTIVE IMPAIRMENTS decreased activity tolerance, decreased balance, decreased coordination, decreased endurance, decreased mobility, difficulty walking, decreased strength, increased fascial restrictions, increased muscle spasms, impaired flexibility, improper body mechanics, postural dysfunction, and pain.   ACTIVITY LIMITATIONS carrying, lifting, standing, stairs, and locomotion level  PARTICIPATION LIMITATIONS: meal prep, cleaning, laundry, community activity, and yard work  PERSONAL FACTORS Age, Past/current experiences, Time since onset of injury/illness/exacerbation, and 1 comorbidity: medical history  are also affecting patient's functional outcome.   REHAB POTENTIAL: Good  CLINICAL DECISION MAKING: Evolving/moderate complexity  EVALUATION COMPLEXITY: Moderate   GOALS: Goals reviewed with patient? Yes  SHORT TERM GOALS: Target date: 03/25/2022  Pt to be I with HEP.  Baseline: Goal status: MET  2.  Pt will report 25% reduction of pain due to improvements in posture, strength, and muscle length  Baseline:  Goal status: on going  3.  Pt to be able to perform 10 STS with minimal use of bil UE without LOB or sway due to improved strength and tolerance to activity. Baseline:  Goal status: on going  4.  Pt to demonstrate improved gait  mechanics with midline upright posture, increased step height and improved cadence  for at least 75' to decrease fall risk and improve safety with ambulating to/from bathroom.  Baseline:  Goal status: on going    LONG TERM GOALS: Target date:  05/28/22    Pt to be I with advanced HEP.  Baseline:  Goal status: INITIAL  2.  Pt will report 50% reduction of pain due to improvements in posture, strength, and muscle length  Baseline:  Goal status: INITIAL  3.  Pt to demonstrate improved gait mechanics with midline upright posture, increased step height and improved cadence for at least 200' to decrease fall risk and improve safety with ambulating to/from bathroom.  Baseline:  Goal status: INITIAL  4.  Pt to demonstrate at least 5/5 bil hip strength for improved pelvic stability and functional squats without leakage.  Baseline:  Goal status: INITIAL  5.  Pt to demonstrate at least 4/5 pelvic floor strength with ability to fully relax between contractions for improved pelvic stability  and decreased strain at pelvic floor Baseline:  Goal status: INITIAL  6.  Pt to be I with bowel and bladder voiding techniques to decrease need to strain and feel fully empty. Baseline:  Goal status: INITIAL  PLAN: PT FREQUENCY: 1x/week  PT DURATION:  8 sessions  PLANNED INTERVENTIONS: Therapeutic exercises, Therapeutic activity, Neuromuscular re-education, Balance training, Gait training, Patient/Family education, Self Care, Joint mobilization, Aquatic Therapy, Dry Needling, Spinal mobilization, Cryotherapy, Moist heat, Manual lymph drainage, scar mobilization, Taping, Biofeedback, and Manual therapy  PLAN FOR NEXT SESSION: internal if needed and pt consents, breathing mechanics, voiding mechanics as needed, abdominal/hip/spine stretching and strengthening  Stacy Gardner, PT, DPT 03/28/2311:33 PM

## 2022-04-01 DIAGNOSIS — F4323 Adjustment disorder with mixed anxiety and depressed mood: Secondary | ICD-10-CM | POA: Diagnosis not present

## 2022-04-03 ENCOUNTER — Ambulatory Visit: Payer: PPO | Admitting: Physical Therapy

## 2022-04-03 DIAGNOSIS — R269 Unspecified abnormalities of gait and mobility: Secondary | ICD-10-CM

## 2022-04-03 DIAGNOSIS — M6281 Muscle weakness (generalized): Secondary | ICD-10-CM | POA: Diagnosis not present

## 2022-04-03 DIAGNOSIS — R279 Unspecified lack of coordination: Secondary | ICD-10-CM

## 2022-04-03 DIAGNOSIS — M62838 Other muscle spasm: Secondary | ICD-10-CM

## 2022-04-03 DIAGNOSIS — R293 Abnormal posture: Secondary | ICD-10-CM

## 2022-04-03 NOTE — Therapy (Signed)
OUTPATIENT PHYSICAL THERAPY FEMALE PELVIC EVALUATION   Patient Name: Ashley Neal MRN: [id number] DOB:1946/05/01, 76 y.o., female Today's Date: 04/03/2022   PT End of Session - 04/03/22 1404     Visit Number 6    Date for PT Re-Evaluation 05/28/22    Authorization Type healthstream    PT Start Time 1400    PT Stop Time 1440    PT Time Calculation (min) 40 min    Activity Tolerance Patient limited by pain    Behavior During Therapy [facility] for tasks assessed/performed              Past Medical History:  Diagnosis Date   Anxiety    Depression    GERD (gastroesophageal reflux disease)    Hypertension    Neuromuscular disorder ([place])    Osteoarthritis    Osteopenia    Parkinson disease ([place])    Pneumonia    Tremors of nervous system    legs   uterine ca 10/08/2021   also endometrial cancer   Past Surgical History:  Procedure Laterality Date   CARPAL TUNNEL RELEASE Bilateral 1996/1997   CYSTOSCOPY N/A 10/08/2021   Procedure: CYSTOSCOPY;  Surgeon: Lafonda Mosses, MD;  Location: [facility];  Service: Gynecology;  Laterality: N/A;   ROBOTIC ASSISTED TOTAL HYSTERECTOMY WITH BILATERAL SALPINGO OOPHERECTOMY N/A 10/08/2021   Procedure: XI ROBOTIC ASSISTED TOTAL HYSTERECTOMY WITH BILATERAL SALPINGO OOPHORECTOMY;SENTINEL LYMPH NODE INJECTION;  Surgeon: Lafonda Mosses, MD;  Location: [facility];  Service: Gynecology;  Laterality: N/A;   SQUAMOUS CELL CARCINOMA EXCISION Left 2017   Left jawline   TUBAL LIGATION  1980's   Patient Active Problem List   Diagnosis Date Noted   Endometrial cancer ([place]) 10/16/2021   Complex atypical endometrial hyperplasia 09/27/2021   Primary osteoarthritis of both hands 05/14/2020   Primary osteoarthritis of both feet 05/14/2020   Age-related osteoporosis without current pathological fracture 05/14/2020   Prediabetes 05/14/2020   Essential hypertension 05/14/2020   History of hyperlipidemia 05/14/2020   History of squamous cell carcinoma  05/14/2020   Parkinsonism ([place]) 05/14/2020    PCP: not listed in chart  REFERRING PROVIDER: Faustino Congress, NP  REFERRING DIAG: M53.3 (ICD-10-CM) - Sacrococcygeal disorders, not elsewhere classified R32 (ICD-10-CM) - Unspecified urinary incontinence  THERAPY DIAG:  Muscle weakness (generalized)  Unspecified lack of coordination  Other muscle spasm  Abnormality of gait and mobility  Abnormal posture  Rationale for Evaluation and Treatment Rehabilitation  ONSET DATE: 1 year ago  SUBJECTIVE:  SUBJECTIVE STATEMENT: Pt reports her pain has started to decreased slightly from 9-10/10 to 6-7/10.   Fluid intake: "I drink a lot of water all day"   PAIN:  Are you having pain? Yes NPRS scale: 5-7/10 pending how close to time taking medication Pain location:  posterior at sacrum and coccyx and low back  Pain type: burning and sharp Pain description: constant   Aggravating factors: sitting, any pressure at low back Relieving factors: resting at sidelying or prone  PRECAUTIONS: Fall  WEIGHT BEARING RESTRICTIONS No  FALLS:  Has patient fallen in last 6 months? Yes. Number of falls 1  LIVING ENVIRONMENT: Lives with: lives alone Lives in: House/apartment Stairs: Yes: External: 1 steps; on left going up Has following equipment at home: None  OCCUPATION: retired  PLOF: Independent  PATIENT GOALS to have less pain  PERTINENT HISTORY:  ROBOTIC ASSISTED PELVIC LYMPH NODE DISSECTION, PARA-AORTIC LYMPHADENECTOMY, CYSTOSCOPY 11/06/21 due to endometrial CA;robotic assisted total hysterectomy and bilateral salpingo-oophorectomy on 10/08/21; parkinsons, hypertension, hyperlipidemia, history of colitis, osteoarthritis, osteoporosis, reflux disease, anxiety, overweight  currently in radiation,  allergic to tape MRI findinds 08/31/21: incidental finding of CA, osteoarthritis of bilateral hips.large high-grade partial-thickness tear of the left hamstring origin.  Partial-thickness tear of the left gluteus medius tendon insertion.  Mild osteoarthritis of bilateral SI joints also noted. Sexual abuse: No  BOWEL MOVEMENT Pain with bowel movement: No Type of bowel movement:Type (Bristol Stool Scale) 2-5, Frequency every third day, and Strain Yes Fully empty rectum: No Leakage: No Pads: No Fiber supplement: Yes: miralax, ducalax  URINATION Pain with urination: No Fully empty bladder: No has had recent UTIs that limits this Stream: Strong Urgency: Yes:   Frequency: every 2 hours Leakage:  none Pads: No  INTERCOURSE Pain with intercourse:  not painful Ability to have vaginal penetration:  Yes:   Climax: not painful  Marinoff Scale: 0/3  PREGNANCY Vaginal deliveries 2 Tearing No C-section deliveries 2 Currently pregnant No  PROLAPSE None    OBJECTIVE:   DIAGNOSTIC FINDINGS:    COGNITION:  Overall cognitive status: Within functional limits for tasks assessed     SENSATION:  Light touch: Appears intact  Proprioception: Appears intact  MUSCLE LENGTH: Bil hamstrings and adductors by 50%  LUMBAR SPECIAL TESTS:  SI Compression/distraction test: Negative, FABER test: Negative, and Gaenslen's test: Negative   GAIT: Distance walked: 300' Assistive device utilized: None Level of assistance: Modified independence Comments: shuffled,                POSTURE: rounded shoulders, forward head, and posterior pelvic tilt   LUMBARAROM/PROM  A/PROM A/PROM  eval  Flexion Limited by 75%  Extension Limited by 25%  Right lateral flexion Limited by 75%  Left lateral flexion Limited by 75%  Right rotation Limited by 50%  Left rotation Limited by 50%   (Blank rows = not tested)  LOWER EXTREMITY ROM:  WFL but with pain at bil hip abduction and extension  LOWER  EXTREMITY MMT:  Bil hips 3+/5 grossly; knees 4/5 and ankles 5/5   PALPATION:   General  TTP at all quadrants of abdomen with fascial restrictions noted as well though Rt side worse than LT. TTP at L4-S1, midline sacrum, coccyx very painful with pt reporting she feels this is where her pain starts and then radiates in bil directions to mid gluteals.                 External Perineal Exam pt deferred and would like to  think about it for next session                              Internal Pelvic Floor pt deferred and would like to think about it for next session   Patient confirms identification and approves PT to assess internal pelvic floor and treatment No (at eval)  Yes 03/06/22   PELVIC MMT:   MMT 03/06/22   Vaginal 3/5; 8s; 5 reps  Internal Anal Sphincter   External Anal Sphincter   Puborectalis   Diastasis Recti   (Blank rows = not tested)        TONE: pt deferred and would like to think about it for next session   PROLAPSE: pt deferred and would like to think about it for next session   TODAY'S TREATMENT   04/03/2022  Seated happy baby 2x30s Seated piriformis stretch 2x30s each Hip IR stretch in sitting 2x30s Seated TA activation with foam roller press down 2x10 Seated 4# diagonal with opp march 2x10 2x10 5# Sit to stands from mat table Hip shifting 2x10s each in sitting  03/27/2022: Pt requested moist heat at low back to see if this helps relax her a bit (total time 10 mins) during stretching - x5 layers between heat and skin, pt denied and did not have adverse effects to skin post heat use 2x30s single knee to chest stretch Butterfly stretch 3x30s Sitting happy baby 3x30s  Hip IR stretch in sitting 2x30s X10 ant/post rocking with bil hip IR    PATIENT EDUCATION:  Education details: VYMKAKYT Person educated: Patient Education method: Explanation, Demonstration, Tactile cues, Verbal cues, and Handouts Education comprehension: verbalized understanding and  returned demonstration   HOME EXERCISE PROGRAM: Access Code: [email or web address]  ASSESSMENT:  CLINICAL IMPRESSION: Patient reports she can tell pain is getting a little better, has been doing stretches at home and this helps. Pt session focused on hip and low back stretching today and introduced strengthening at core and hips. Pt tolerated well with rest breaks, does need extra time for transitions and due to fatigue. Pt would benefit from additional PT to further address deficits.     OBJECTIVE IMPAIRMENTS decreased activity tolerance, decreased balance, decreased coordination, decreased endurance, decreased mobility, difficulty walking, decreased strength, increased fascial restrictions, increased muscle spasms, impaired flexibility, improper body mechanics, postural dysfunction, and pain.   ACTIVITY LIMITATIONS carrying, lifting, standing, stairs, and locomotion level  PARTICIPATION LIMITATIONS: meal prep, cleaning, laundry, community activity, and yard work  PERSONAL FACTORS Age, Past/current experiences, Time since onset of injury/illness/exacerbation, and 1 comorbidity: medical history  are also affecting patient's functional outcome.   REHAB POTENTIAL: Good  CLINICAL DECISION MAKING: Evolving/moderate complexity  EVALUATION COMPLEXITY: Moderate   GOALS: Goals reviewed with patient? Yes  SHORT TERM GOALS: Target date: 03/25/2022  Pt to be I with HEP.  Baseline: Goal status: MET  2.  Pt will report 25% reduction of pain due to improvements in posture, strength, and muscle length  Baseline:  Goal status: on going  3.  Pt to be able to perform 10 STS with minimal use of bil UE without LOB or sway due to improved strength and tolerance to activity. Baseline:  Goal status: MET  4.  Pt to demonstrate improved gait mechanics with midline upright posture, increased step height and improved cadence for at least 75' to decrease fall risk and improve safety with ambulating to/from  bathroom.  Baseline:  Goal status: MET  LONG TERM GOALS: Target date:  05/28/22    Pt to be I with advanced HEP.  Baseline:  Goal status: on going  2.  Pt will report 50% reduction of pain due to improvements in posture, strength, and muscle length  Baseline:  Goal status: on going  3.  Pt to demonstrate improved gait mechanics with midline upright posture, increased step height and improved cadence for at least 200' to decrease fall risk and improve safety with ambulating to/from bathroom.  Baseline:  Goal status: on going  4.  Pt to demonstrate at least 5/5 bil hip strength for improved pelvic stability and functional squats without leakage.  Baseline:  Goal status: on going  5.  Pt to demonstrate at least 4/5 pelvic floor strength with ability to fully relax between contractions for improved pelvic stability  and decreased strain at pelvic floor Baseline:  Goal status: on going  6.  Pt to be I with bowel and bladder voiding techniques to decrease need to strain and feel fully empty. Baseline:  Goal status: on going  PLAN: PT FREQUENCY: 1x/week  PT DURATION:  8 sessions  PLANNED INTERVENTIONS: Therapeutic exercises, Therapeutic activity, Neuromuscular re-education, Balance training, Gait training, Patient/Family education, Self Care, Joint mobilization, Aquatic Therapy, Dry Needling, Spinal mobilization, Cryotherapy, Moist heat, Manual lymph drainage, scar mobilization, Taping, Biofeedback, and Manual therapy  PLAN FOR NEXT SESSION: internal if needed and pt consents, breathing mechanics, voiding mechanics as needed, abdominal/hip/spine stretching and strengthening  Stacy Gardner, PT, DPT 08/24/232:46 PM

## 2022-04-07 DIAGNOSIS — Z0000 Encounter for general adult medical examination without abnormal findings: Secondary | ICD-10-CM | POA: Diagnosis not present

## 2022-04-07 DIAGNOSIS — F4323 Adjustment disorder with mixed anxiety and depressed mood: Secondary | ICD-10-CM | POA: Diagnosis not present

## 2022-04-07 DIAGNOSIS — E7800 Pure hypercholesterolemia, unspecified: Secondary | ICD-10-CM | POA: Diagnosis not present

## 2022-04-07 DIAGNOSIS — I10 Essential (primary) hypertension: Secondary | ICD-10-CM | POA: Diagnosis not present

## 2022-04-07 DIAGNOSIS — R7303 Prediabetes: Secondary | ICD-10-CM | POA: Diagnosis not present

## 2022-04-07 DIAGNOSIS — Z1389 Encounter for screening for other disorder: Secondary | ICD-10-CM | POA: Diagnosis not present

## 2022-04-07 DIAGNOSIS — G20 Parkinson's disease: Secondary | ICD-10-CM | POA: Diagnosis not present

## 2022-04-07 DIAGNOSIS — I951 Orthostatic hypotension: Secondary | ICD-10-CM | POA: Diagnosis not present

## 2022-04-10 ENCOUNTER — Ambulatory Visit: Payer: PPO | Admitting: Physical Therapy

## 2022-04-10 DIAGNOSIS — M6281 Muscle weakness (generalized): Secondary | ICD-10-CM | POA: Diagnosis not present

## 2022-04-10 DIAGNOSIS — M62838 Other muscle spasm: Secondary | ICD-10-CM

## 2022-04-10 DIAGNOSIS — R279 Unspecified lack of coordination: Secondary | ICD-10-CM

## 2022-04-10 DIAGNOSIS — R293 Abnormal posture: Secondary | ICD-10-CM

## 2022-04-10 NOTE — Therapy (Signed)
OUTPATIENT PHYSICAL THERAPY FEMALE PELVIC TREATMENT   Patient Name: Ashley Neal MRN: [id number] DOB:1946/05/13, 76 y.o., female Today's Date: 04/10/2022   PT End of Session - 04/10/22 1153     Visit Number 7    Date for PT Re-Evaluation 05/28/22    Authorization Type healthstream    PT Start Time 1150    PT Stop Time 1230    PT Time Calculation (min) 40 min    Activity Tolerance Patient limited by pain    Behavior During Therapy [facility] for tasks assessed/performed              Past Medical History:  Diagnosis Date   Anxiety    Depression    GERD (gastroesophageal reflux disease)    Hypertension    Neuromuscular disorder ([place])    Osteoarthritis    Osteopenia    Parkinson disease ([place])    Pneumonia    Tremors of nervous system    legs   uterine ca 10/08/2021   also endometrial cancer   Past Surgical History:  Procedure Laterality Date   CARPAL TUNNEL RELEASE Bilateral 1996/1997   CYSTOSCOPY N/A 10/08/2021   Procedure: CYSTOSCOPY;  Surgeon: Lafonda Mosses, MD;  Location: [facility];  Service: Gynecology;  Laterality: N/A;   ROBOTIC ASSISTED TOTAL HYSTERECTOMY WITH BILATERAL SALPINGO OOPHERECTOMY N/A 10/08/2021   Procedure: XI ROBOTIC ASSISTED TOTAL HYSTERECTOMY WITH BILATERAL SALPINGO OOPHORECTOMY;SENTINEL LYMPH NODE INJECTION;  Surgeon: Lafonda Mosses, MD;  Location: [facility];  Service: Gynecology;  Laterality: N/A;   SQUAMOUS CELL CARCINOMA EXCISION Left 2017   Left jawline   TUBAL LIGATION  1980's   Patient Active Problem List   Diagnosis Date Noted   Endometrial cancer ([place]) 10/16/2021   Complex atypical endometrial hyperplasia 09/27/2021   Primary osteoarthritis of both hands 05/14/2020   Primary osteoarthritis of both feet 05/14/2020   Age-related osteoporosis without current pathological fracture 05/14/2020   Prediabetes 05/14/2020   Essential hypertension 05/14/2020   History of hyperlipidemia 05/14/2020   History of squamous cell carcinoma  05/14/2020   Parkinsonism ([place]) 05/14/2020    PCP: not listed in chart  REFERRING PROVIDER: Faustino Congress, NP  REFERRING DIAG: M53.3 (ICD-10-CM) - Sacrococcygeal disorders, not elsewhere classified R32 (ICD-10-CM) - Unspecified urinary incontinence  THERAPY DIAG:  Muscle weakness (generalized)  Unspecified lack of coordination  Other muscle spasm  Abnormal posture  Rationale for Evaluation and Treatment Rehabilitation  ONSET DATE: 1 year ago  SUBJECTIVE:  SUBJECTIVE STATEMENT: Pt reports "I am exhausted today". I think the exercises are helping, but the pain does not leave it just hold it off".   Fluid intake: "I drink a lot of water all day"   PAIN:  Are you having pain? Yes NPRS scale: 7/10  Pain location:  posterior at sacrum and coccyx and low back  Pain type: burning and sharp Pain description: constant   Aggravating factors: sitting, any pressure at low back Relieving factors: resting at sidelying or prone  PRECAUTIONS: Fall  WEIGHT BEARING RESTRICTIONS No  FALLS:  Has patient fallen in last 6 months? Yes. Number of falls 1  LIVING ENVIRONMENT: Lives with: lives alone Lives in: House/apartment Stairs: Yes: External: 1 steps; on left going up Has following equipment at home: None  OCCUPATION: retired  PLOF: Independent  PATIENT GOALS to have less pain  PERTINENT HISTORY:  ROBOTIC ASSISTED PELVIC LYMPH NODE DISSECTION, PARA-AORTIC LYMPHADENECTOMY, CYSTOSCOPY 11/06/21 due to endometrial CA;robotic assisted total hysterectomy and bilateral salpingo-oophorectomy on 10/08/21; parkinsons, hypertension, hyperlipidemia, history of colitis, osteoarthritis, osteoporosis, reflux disease, anxiety, overweight  currently in radiation, allergic to tape MRI findinds 08/31/21:  incidental finding of CA, osteoarthritis of bilateral hips.large high-grade partial-thickness tear of the left hamstring origin.  Partial-thickness tear of the left gluteus medius tendon insertion.  Mild osteoarthritis of bilateral SI joints also noted. Sexual abuse: No  BOWEL MOVEMENT Pain with bowel movement: No Type of bowel movement:Type (Bristol Stool Scale) 2-5, Frequency every third day, and Strain Yes Fully empty rectum: No Leakage: No Pads: No Fiber supplement: Yes: miralax, ducalax  URINATION Pain with urination: No Fully empty bladder: No has had recent UTIs that limits this Stream: Strong Urgency: Yes:   Frequency: every 2 hours Leakage:  none Pads: No  INTERCOURSE Pain with intercourse:  not painful Ability to have vaginal penetration:  Yes:   Climax: not painful  Marinoff Scale: 0/3  PREGNANCY Vaginal deliveries 2 Tearing No C-section deliveries 2 Currently pregnant No  PROLAPSE None    OBJECTIVE:   DIAGNOSTIC FINDINGS:    COGNITION:  Overall cognitive status: Within functional limits for tasks assessed     SENSATION:  Light touch: Appears intact  Proprioception: Appears intact  MUSCLE LENGTH: Bil hamstrings and adductors by 50%  LUMBAR SPECIAL TESTS:  SI Compression/distraction test: Negative, FABER test: Negative, and Gaenslen's test: Negative   GAIT: Distance walked: 300' Assistive device utilized: None Level of assistance: Modified independence Comments: shuffled,                POSTURE: rounded shoulders, forward head, and posterior pelvic tilt   LUMBARAROM/PROM  A/PROM A/PROM  eval  Flexion Limited by 75%  Extension Limited by 25%  Right lateral flexion Limited by 75%  Left lateral flexion Limited by 75%  Right rotation Limited by 50%  Left rotation Limited by 50%   (Blank rows = not tested)  LOWER EXTREMITY ROM:  WFL but with pain at bil hip abduction and extension  LOWER EXTREMITY MMT:  Bil hips 3+/5 grossly;  knees 4/5 and ankles 5/5   PALPATION:   General  TTP at all quadrants of abdomen with fascial restrictions noted as well though Rt side worse than LT. TTP at L4-S1, midline sacrum, coccyx very painful with pt reporting she feels this is where her pain starts and then radiates in bil directions to mid gluteals.                 External Perineal Exam pt deferred  and would like to think about it for next session                              Internal Pelvic Floor pt deferred and would like to think about it for next session   Patient confirms identification and approves PT to assess internal pelvic floor and treatment No (at eval)  Yes 03/06/22   PELVIC MMT:   MMT 03/06/22   Vaginal 3/5; 8s; 5 reps  Internal Anal Sphincter   External Anal Sphincter   Puborectalis   Diastasis Recti   (Blank rows = not tested)        TONE: pt deferred and would like to think about it for next session   PROLAPSE: pt deferred and would like to think about it for next session   TODAY'S TREATMENT   04/10/22: Pt deferred internal rectal assessment/treatment today but consented to external manual work at coccyx and low back.   Manual: over clothing, initially attempted in prone however pt reported this was uncomfortable, the in Lt sidelying. Sacral springing completed in modified sidelying x10, tightness noted at bil sides of coccyx but worse at lt side and manual myofascial release and soft tissue work completed lateral Lt and Rt to coccyx and at distal end of coccyx. Pt reports palpation to Lt of coccyx is replicable to pain felt and radiates to bil low back/sacrum.     04/03/2022  Seated happy baby 2x30s Seated piriformis stretch 2x30s each Hip IR stretch in sitting 2x30s Seated TA activation with foam roller press down 2x10 Seated 4# diagonal with opp march 2x10 2x10 5# Sit to stands from mat table Hip shifting 2x10s each in sitting   PATIENT EDUCATION:  Education details: VYMKAKYT Person  educated: Patient Education method: Consulting civil engineer, Demonstration, Tactile cues, Verbal cues, and Handouts Education comprehension: verbalized understanding and returned demonstration   HOME EXERCISE PROGRAM: Access Code: [email or web address]  ASSESSMENT:  CLINICAL IMPRESSION: Patient reports exercises/stretching at home does continue to help pain levels and no longer having "take your breath away pain" but it does continue. Pt session focused on manual work at sacrum and coccyx where pt reported pain felt during session was location of replicable to pain felt at baseline. Pt tolerated well and reports she is happy that manual made pain feel a little better and will do stretches at home to see if this helps. Pt would benefit from additional PT to further address deficits.     OBJECTIVE IMPAIRMENTS decreased activity tolerance, decreased balance, decreased coordination, decreased endurance, decreased mobility, difficulty walking, decreased strength, increased fascial restrictions, increased muscle spasms, impaired flexibility, improper body mechanics, postural dysfunction, and pain.   ACTIVITY LIMITATIONS carrying, lifting, standing, stairs, and locomotion level  PARTICIPATION LIMITATIONS: meal prep, cleaning, laundry, community activity, and yard work  PERSONAL FACTORS Age, Past/current experiences, Time since onset of injury/illness/exacerbation, and 1 comorbidity: medical history  are also affecting patient's functional outcome.   REHAB POTENTIAL: Good  CLINICAL DECISION MAKING: Evolving/moderate complexity  EVALUATION COMPLEXITY: Moderate   GOALS: Goals reviewed with patient? Yes  SHORT TERM GOALS: Target date: 03/25/2022  Pt to be I with HEP.  Baseline: Goal status: MET  2.  Pt will report 25% reduction of pain due to improvements in posture, strength, and muscle length  Baseline:  Goal status: on going  3.  Pt to be able to perform 10 STS with minimal use of bil UE without LOB or sway  due to improved strength and tolerance to activity. Baseline:  Goal status: MET  4.  Pt to demonstrate improved gait mechanics with midline upright posture, increased step height and improved cadence for at least 75' to decrease fall risk and improve safety with ambulating to/from bathroom.  Baseline:  Goal status: MET    LONG TERM GOALS: Target date:  05/28/22    Pt to be I with advanced HEP.  Baseline:  Goal status: on going  2.  Pt will report 50% reduction of pain due to improvements in posture, strength, and muscle length  Baseline:  Goal status: on going  3.  Pt to demonstrate improved gait mechanics with midline upright posture, increased step height and improved cadence for at least 200' to decrease fall risk and improve safety with ambulating to/from bathroom.  Baseline:  Goal status: on going  4.  Pt to demonstrate at least 5/5 bil hip strength for improved pelvic stability and functional squats without leakage.  Baseline:  Goal status: on going  5.  Pt to demonstrate at least 4/5 pelvic floor strength with ability to fully relax between contractions for improved pelvic stability  and decreased strain at pelvic floor Baseline:  Goal status: on going  6.  Pt to be I with bowel and bladder voiding techniques to decrease need to strain and feel fully empty. Baseline:  Goal status: on going  PLAN: PT FREQUENCY: 1x/week  PT DURATION:  8 sessions  PLANNED INTERVENTIONS: Therapeutic exercises, Therapeutic activity, Neuromuscular re-education, Balance training, Gait training, Patient/Family education, Self Care, Joint mobilization, Aquatic Therapy, Dry Needling, Spinal mobilization, Cryotherapy, Moist heat, Manual lymph drainage, scar mobilization, Taping, Biofeedback, and Manual therapy  PLAN FOR NEXT SESSION: internal if needed and pt consents, breathing mechanics, voiding mechanics as needed, abdominal/hip/spine stretching and strengthening  Stacy Gardner, PT,  DPT 08/31/231:07 PM

## 2022-04-16 ENCOUNTER — Ambulatory Visit: Payer: PPO | Attending: Family Medicine | Admitting: Physical Therapy

## 2022-04-16 DIAGNOSIS — R293 Abnormal posture: Secondary | ICD-10-CM | POA: Diagnosis not present

## 2022-04-16 DIAGNOSIS — R269 Unspecified abnormalities of gait and mobility: Secondary | ICD-10-CM | POA: Insufficient documentation

## 2022-04-16 DIAGNOSIS — M6281 Muscle weakness (generalized): Secondary | ICD-10-CM | POA: Diagnosis not present

## 2022-04-16 DIAGNOSIS — R279 Unspecified lack of coordination: Secondary | ICD-10-CM | POA: Insufficient documentation

## 2022-04-16 DIAGNOSIS — M62838 Other muscle spasm: Secondary | ICD-10-CM | POA: Diagnosis not present

## 2022-04-16 NOTE — Therapy (Signed)
OUTPATIENT PHYSICAL THERAPY FEMALE PELVIC TREATMENT   Patient Name: Ashley Neal MRN: [id number] DOB:09-01-1945, 76 y.o., female Today's Date: 04/16/2022   PT End of Session - 04/16/22 1150     Visit Number 8    Date for PT Re-Evaluation 05/28/22    Authorization Type healthstream    PT Start Time 1145    PT Stop Time 1230    PT Time Calculation (min) 45 min    Activity Tolerance Patient limited by pain    Behavior During Therapy [facility] for tasks assessed/performed              Past Medical History:  Diagnosis Date   Anxiety    Depression    GERD (gastroesophageal reflux disease)    Hypertension    Neuromuscular disorder ([place])    Osteoarthritis    Osteopenia    Parkinson disease ([place])    Pneumonia    Tremors of nervous system    legs   uterine ca 10/08/2021   also endometrial cancer   Past Surgical History:  Procedure Laterality Date   CARPAL TUNNEL RELEASE Bilateral 1996/1997   CYSTOSCOPY N/A 10/08/2021   Procedure: CYSTOSCOPY;  Surgeon: Lafonda Mosses, MD;  Location: [facility];  Service: Gynecology;  Laterality: N/A;   ROBOTIC ASSISTED TOTAL HYSTERECTOMY WITH BILATERAL SALPINGO OOPHERECTOMY N/A 10/08/2021   Procedure: XI ROBOTIC ASSISTED TOTAL HYSTERECTOMY WITH BILATERAL SALPINGO OOPHORECTOMY;SENTINEL LYMPH NODE INJECTION;  Surgeon: Lafonda Mosses, MD;  Location: [facility];  Service: Gynecology;  Laterality: N/A;   SQUAMOUS CELL CARCINOMA EXCISION Left 2017   Left jawline   TUBAL LIGATION  1980's   Patient Active Problem List   Diagnosis Date Noted   Endometrial cancer ([place]) 10/16/2021   Complex atypical endometrial hyperplasia 09/27/2021   Primary osteoarthritis of both hands 05/14/2020   Primary osteoarthritis of both feet 05/14/2020   Age-related osteoporosis without current pathological fracture 05/14/2020   Prediabetes 05/14/2020   Essential hypertension 05/14/2020   History of hyperlipidemia 05/14/2020   History of squamous cell carcinoma  05/14/2020   Parkinsonism ([place]) 05/14/2020    PCP: not listed in chart  REFERRING PROVIDER: Faustino Congress, NP  REFERRING DIAG: M53.3 (ICD-10-CM) - Sacrococcygeal disorders, not elsewhere classified R32 (ICD-10-CM) - Unspecified urinary incontinence  THERAPY DIAG:  Muscle weakness (generalized)  Unspecified lack of coordination  Abnormal posture  Other muscle spasm  Rationale for Evaluation and Treatment Rehabilitation  ONSET DATE: 1 year ago  SUBJECTIVE:  SUBJECTIVE STATEMENT: Pt reports after session last week "I felt great for like 3-4 days afterward. I felt like I could walk again."  Fluid intake: "I drink a lot of water all day"   PAIN:  Are you having pain? Yes NPRS scale: 3/10 right now (yesterday 6/`0, best after session last week 1/10) Pain location:  posterior at sacrum and coccyx and low back  Pain type: burning and sharp Pain description: constant   Aggravating factors: sitting, any pressure at low back Relieving factors: resting at sidelying or prone  PRECAUTIONS: Fall  WEIGHT BEARING RESTRICTIONS No  FALLS:  Has patient fallen in last 6 months? Yes. Number of falls 1  LIVING ENVIRONMENT: Lives with: lives alone Lives in: House/apartment Stairs: Yes: External: 1 steps; on left going up Has following equipment at home: None  OCCUPATION: retired  PLOF: Independent  PATIENT GOALS to have less pain  PERTINENT HISTORY:  ROBOTIC ASSISTED PELVIC LYMPH NODE DISSECTION, PARA-AORTIC LYMPHADENECTOMY, CYSTOSCOPY 11/06/21 due to endometrial CA;robotic assisted total hysterectomy and bilateral salpingo-oophorectomy on 10/08/21; parkinsons, hypertension, hyperlipidemia, history of colitis, osteoarthritis, osteoporosis, reflux disease, anxiety, overweight  currently in  radiation, allergic to tape MRI findinds 08/31/21: incidental finding of CA, osteoarthritis of bilateral hips.large high-grade partial-thickness tear of the left hamstring origin.  Partial-thickness tear of the left gluteus medius tendon insertion.  Mild osteoarthritis of bilateral SI joints also noted. Sexual abuse: No  BOWEL MOVEMENT Pain with bowel movement: No Type of bowel movement:Type (Bristol Stool Scale) 2-5, Frequency every third day, and Strain Yes Fully empty rectum: No Leakage: No Pads: No Fiber supplement: Yes: miralax, ducalax  URINATION Pain with urination: No Fully empty bladder: No has had recent UTIs that limits this Stream: Strong Urgency: Yes:   Frequency: every 2 hours Leakage:  none Pads: No  INTERCOURSE Pain with intercourse:  not painful Ability to have vaginal penetration:  Yes:   Climax: not painful  Marinoff Scale: 0/3  PREGNANCY Vaginal deliveries 2 Tearing No C-section deliveries 2 Currently pregnant No  PROLAPSE None    OBJECTIVE:   DIAGNOSTIC FINDINGS:    COGNITION:  Overall cognitive status: Within functional limits for tasks assessed     SENSATION:  Light touch: Appears intact  Proprioception: Appears intact  MUSCLE LENGTH: Bil hamstrings and adductors by 50%  LUMBAR SPECIAL TESTS:  SI Compression/distraction test: Negative, FABER test: Negative, and Gaenslen's test: Negative   GAIT: Distance walked: 300' Assistive device utilized: None Level of assistance: Modified independence Comments: shuffled,                POSTURE: rounded shoulders, forward head, and posterior pelvic tilt   LUMBARAROM/PROM  A/PROM A/PROM  eval  Flexion Limited by 75%  Extension Limited by 25%  Right lateral flexion Limited by 75%  Left lateral flexion Limited by 75%  Right rotation Limited by 50%  Left rotation Limited by 50%   (Blank rows = not tested)  LOWER EXTREMITY ROM:  WFL but with pain at bil hip abduction and  extension  LOWER EXTREMITY MMT:  Bil hips 3+/5 grossly; knees 4/5 and ankles 5/5   PALPATION:   General  TTP at all quadrants of abdomen with fascial restrictions noted as well though Rt side worse than LT. TTP at L4-S1, midline sacrum, coccyx very painful with pt reporting she feels this is where her pain starts and then radiates in bil directions to mid gluteals.  External Perineal Exam pt deferred and would like to think about it for next session                              Internal Pelvic Floor pt deferred and would like to think about it for next session   Patient confirms identification and approves PT to assess internal pelvic floor and treatment No (at eval)  Yes 03/06/22   PELVIC MMT:   MMT 03/06/22   Vaginal 3/5; 8s; 5 reps  Internal Anal Sphincter   External Anal Sphincter   Puborectalis   Diastasis Recti   (Blank rows = not tested)        TONE: pt deferred and would like to think about it for next session   PROLAPSE: pt deferred and would like to think about it for next session   TODAY'S TREATMENT   04/16/22: Manual: overall clothing, addaday used at bil gluteals, bil lumbar spine and paraspinals, bil hamstrings. PT hands on manual myofascial release and soft tissue work completed lateral Lt and Rt to coccyx and at distal end of coccyx. Pt much less tense compared to last session and pt reported feeling much better but still felt tight. Continued to feel most replicable pain at lt side of distal coccyx but better than last time.  Therapeutic exercise:  Bil single knee to chest 2x30s Bil unilateral happy baby 2x30s Bil piriformis 2x30s  Thoracic openers 2x30s bil   04/10/22: Pt deferred internal rectal assessment/treatment today but consented to external manual work at coccyx and low back.   Manual: over clothing, initially attempted in prone however pt reported this was uncomfortable, the in Lt sidelying. Sacral springing completed in modified  sidelying x10, tightness noted at bil sides of coccyx but worse at lt side and manual myofascial release and soft tissue work completed lateral Lt and Rt to coccyx and at distal end of coccyx. Pt reports palpation to Lt of coccyx is replicable to pain felt and radiates to bil low back/sacrum.        PATIENT EDUCATION:  Education details: VYMKAKYT Person educated: Patient Education method: Consulting civil engineer, Demonstration, Tactile cues, Verbal cues, and Handouts Education comprehension: verbalized understanding and returned demonstration   HOME EXERCISE PROGRAM: Access Code: [email or web address]  ASSESSMENT:  CLINICAL IMPRESSION: Patient reports this last week has been really helpful, pain levels much better and more tolerable after manual work last week.pt session focused on manual work and stretching afterward. Pt tolerated well and reports feeling better at end of session pain at 1-2/10. Pt would benefit from additional PT to further address deficits.     OBJECTIVE IMPAIRMENTS decreased activity tolerance, decreased balance, decreased coordination, decreased endurance, decreased mobility, difficulty walking, decreased strength, increased fascial restrictions, increased muscle spasms, impaired flexibility, improper body mechanics, postural dysfunction, and pain.   ACTIVITY LIMITATIONS carrying, lifting, standing, stairs, and locomotion level  PARTICIPATION LIMITATIONS: meal prep, cleaning, laundry, community activity, and yard work  PERSONAL FACTORS Age, Past/current experiences, Time since onset of injury/illness/exacerbation, and 1 comorbidity: medical history  are also affecting patient's functional outcome.   REHAB POTENTIAL: Good  CLINICAL DECISION MAKING: Evolving/moderate complexity  EVALUATION COMPLEXITY: Moderate   GOALS: Goals reviewed with patient? Yes  SHORT TERM GOALS: Target date: 03/25/2022  Pt to be I with HEP.  Baseline: Goal status: MET  2.  Pt will report 25% reduction  of pain due to improvements in posture, strength, and muscle length  Baseline:  Goal status: on going  3.  Pt to be able to perform 10 STS with minimal use of bil UE without LOB or sway due to improved strength and tolerance to activity. Baseline:  Goal status: MET  4.  Pt to demonstrate improved gait mechanics with midline upright posture, increased step height and improved cadence for at least 75' to decrease fall risk and improve safety with ambulating to/from bathroom.  Baseline:  Goal status: MET    LONG TERM GOALS: Target date:  05/28/22    Pt to be I with advanced HEP.  Baseline:  Goal status: on going  2.  Pt will report 50% reduction of pain due to improvements in posture, strength, and muscle length  Baseline:  Goal status: on going  3.  Pt to demonstrate improved gait mechanics with midline upright posture, increased step height and improved cadence for at least 200' to decrease fall risk and improve safety with ambulating to/from bathroom.  Baseline:  Goal status: on going  4.  Pt to demonstrate at least 5/5 bil hip strength for improved pelvic stability and functional squats without leakage.  Baseline:  Goal status: on going  5.  Pt to demonstrate at least 4/5 pelvic floor strength with ability to fully relax between contractions for improved pelvic stability  and decreased strain at pelvic floor Baseline:  Goal status: on going  6.  Pt to be I with bowel and bladder voiding techniques to decrease need to strain and feel fully empty. Baseline:  Goal status: on going  PLAN: PT FREQUENCY: 1x/week  PT DURATION:  8 sessions  PLANNED INTERVENTIONS: Therapeutic exercises, Therapeutic activity, Neuromuscular re-education, Balance training, Gait training, Patient/Family education, Self Care, Joint mobilization, Aquatic Therapy, Dry Needling, Spinal mobilization, Cryotherapy, Moist heat, Manual lymph drainage, scar mobilization, Taping, Biofeedback, and Manual  therapy  PLAN FOR NEXT SESSION: internal if needed and pt consents, breathing mechanics, voiding mechanics as needed, abdominal/hip/spine stretching and strengthening  Stacy Gardner, PT, DPT 04/17/2311:31 PM

## 2022-04-17 ENCOUNTER — Encounter: Payer: PPO | Admitting: Physical Therapy

## 2022-04-21 ENCOUNTER — Inpatient Hospital Stay: Payer: PPO | Attending: Gynecologic Oncology | Admitting: Gynecologic Oncology

## 2022-04-21 ENCOUNTER — Encounter: Payer: Self-pay | Admitting: Gynecologic Oncology

## 2022-04-21 ENCOUNTER — Other Ambulatory Visit: Payer: Self-pay

## 2022-04-21 VITALS — BP 144/64 | HR 72 | Temp 97.7°F | Resp 16 | Ht 60.63 in | Wt 136.6 lb

## 2022-04-21 DIAGNOSIS — Z90710 Acquired absence of both cervix and uterus: Secondary | ICD-10-CM | POA: Insufficient documentation

## 2022-04-21 DIAGNOSIS — Z8542 Personal history of malignant neoplasm of other parts of uterus: Secondary | ICD-10-CM | POA: Diagnosis not present

## 2022-04-21 DIAGNOSIS — Z90722 Acquired absence of ovaries, bilateral: Secondary | ICD-10-CM | POA: Diagnosis not present

## 2022-04-21 DIAGNOSIS — C541 Malignant neoplasm of endometrium: Secondary | ICD-10-CM

## 2022-04-21 DIAGNOSIS — Z923 Personal history of irradiation: Secondary | ICD-10-CM | POA: Insufficient documentation

## 2022-04-21 NOTE — Progress Notes (Signed)
Gynecologic Oncology Return Clinic Visit  04/21/2022  Reason for Visit: Surveillance in the setting of endometrial cancer  Treatment History: Oncology History Overview Note  MMR IHC intact MS stable   Endometrial cancer ([facility])  08/31/2021 Imaging   MRI of the pelvis performed for low back pain on 08/31/2021 with incidental finding of endometrial thickening up to 1 cm, suboptimally evaluated.  There is no pelvic free fluid.  No adenopathy.  2.5 cm right ovarian cyst noted.  Other MRI findings: no hip fracture, dislocation or avascular necrosis.  There is mild osteoarthritis of bilateral hips.  There is a large high-grade partial-thickness tear of the left hamstring origin.  Partial-thickness tear of the left gluteus medius tendon insertion.  Mild osteoarthritis of bilateral SI joints also noted.   09/04/2021 Imaging   Pelvic ultrasound exam on 09/04/2021 shows a uterus measuring 6.4 x 3.1 x 4.2 cm with an endometrial lining of 1.1 cm with fluid.  Irregular appearance of the right ovary with simple follicle measuring up to 5 mm, avascular.  Simple cyst adjacent to the right ovary measures up to 2.7 cm and also avascular.  Left ovary noted to be normal.   09/12/2021 Initial Biopsy   EMB: showed rare atypical glands, predominantly mucus with few admixed benign endocervical glands.  Although the rare glandular fragments with cribriform pattern and mild cytologic atypia are extremely scant, they are concerning for at least endometrial hyperplasia.   10/08/2021 Surgery   TRH/BSO, SLN injection with no mapping bilaterally, cystoscopy No residual hyperplasia seen on frozen, no malignancy   Findings:  On EUA, small mobile uterus. Normal upper abdominal survey. Normal appearing omentum and small and large bowel. Normal appendix. Uterus 8cm and normal in appearance with small fundal fibroid. Normal bilateral adnexa with evidence of prior BTL. ICG seen along posterior cervix, mapping not appreciated in either  pelvic LN basin. No obvious adenopathy. Given some difficulty with the uterine manipulator and trouble initially identifying plane between bladder and LUS and cervix, cystoscopy was performed. Bladder dome intact, good efflux noted from bilateral ureteral orifices.    10/08/2021 Pathology Results   A. UTERUS, CERVIX, BILATERAL FALLOPIAN TUBES AND OVARIES:  Invasive well differentiated mucinous endometrial adenocarcinoma, FIGO 1  Tumor invades greater than 50% of the myometrium (11 mm /18 mm)  Tumor arises within complex atypical mucinous hyperplasia involving a  polyp and the anterior and posterior endometrium  Adenomyosis  Background inactive endometrium with cystic change  Benign serous cyst papillary adenofibroma of right ovary  Chronic cervicitis with nabothian cysts  Benign left ovary and right and left fallopian tubes   ONCOLOGY TABLE:   UTERUS, CARCINOMA OR CARCINOSARCOMA: Resection   Procedure: Total hysterectomy and bilateral salpingo-oophorectomy  Histologic Type: Mucinous endometrial adenocarcinoma  Histologic Grade: Well differentiated, FIGO 1  Myometrial Invasion: Present       Depth of Myometrial Invasion (mm): 11 mm       Myometrial Thickness (mm): 18 mm       Percentage of Myometrial Invasion: 61%  Uterine Serosa Involvement: Not identified  Cervical stromal Involvement:[Not identified  Extent of involvement of other tissue/organs: Not identified  Peritoneal/Ascitic Fluid: Not submitted  Lymphovascular Invasion: Not identified  Regional Lymph Nodes: Not applicable (no lymph nodes submitted or found)   Distant Metastasis: Not applicable  Pathologic Stage Classification (pTNM, AJCC 8th Edition): pT1b, pN n/a  Ancillary Studies: MMR / MSI testing will be ordered  Representative Tumor Block: A5, A6  Comment(s): None    10/16/2021 Initial  Diagnosis   Endometrial cancer ([facility])   11/06/2021 Surgery   Robotic-assisted bilateral pelvic lymph node dissection, right  para-aortic lymph node dissection, cystoscopy  Findings: On EUA, well-healed vaginal cuff.  Normal upper abdominal survey.  Normal-appearing omentum, small and large bowel.  Some filmy adhesions of the sigmoid epiploica to the left pelvic sidewall.  Significant fibrosis noted of bilateral pelvic sidewalls and the retroperitoneal spaces.  Nodular tissue along some areas recent surgical dissection, presumed to be inflammatory from surgery.  No obvious adenopathy.  Fibrosis made pelvic lymph node dissection very challenging with significant adherence of minimal lymphatic tissue to the right external iliac vein.  Anatomy quite challenging to delineate in the setting of this fibrosis as well as recent surgery.  Along the para-aortic lymph node beds, no significant tissue noted along the left aspect and due to retroperitoneal fibrosis, further dissection did not seem safe.  Along the right, what was presumed to be the IVC was displaced laterally.  Cystoscopy, bladder dome noted to be intact, good efflux noted from bilateral ureteral orifices.   11/06/2021 Pathology Results   A. LYMPH NODE, LEFT PELVIC, EXCISION:  - Four lymph nodes, negative for carcinoma (0/4)   B. PERITONEUM, BIOPSY:  - Peritonealized soft tissue with focal fat necrosis  - Negative for carcinoma   C. LYMPH NODE, RIGHT PELVIC, EXCISION:  - Five lymph nodes, negative for carcinoma (0/5)   D. ROUND LIGAMENT REMNANT, EXCISION:  - Negative for carcinoma   E. PARA-AORTIC, RIGHT, EXCISION:  - Lymph node, negative for carcinoma (0   12/18/2021 - 01/15/2022 Radiation Therapy   Vaginal brachytherapy - 30 Gy     Interval History: Having significant back pain that has significantly impacted her quality of life recently.  She is working with pelvic floor physical therapy which is helping a lot.  She has significant improvement when she does the exercises on her own.  This is pain that she developed prior to her diagnosis of cancer that started  after bending down and rolling her left hip.  Her pain ultimately settled in her coccyx and has been there since.  Tolerated radiation well although had some dermatologic side effects with significant peeling after treatment.  She has been using her vaginal dilator twice a week.  She denies any vaginal bleeding or discharge.  Reports having hot flashes for several months, now improved.  Her bowel function is at baseline, continues to use her bowel regimen.  Past Medical/Surgical History: Past Medical History:  Diagnosis Date   Anxiety    Depression    GERD (gastroesophageal reflux disease)    Hypertension    Neuromuscular disorder (HCC)    Osteoarthritis    Osteopenia    Parkinson disease (HCC)    Pneumonia    Tremors of nervous system    legs   uterine ca 10/08/2021   also endometrial cancer    Past Surgical History:  Procedure Laterality Date   CARPAL TUNNEL RELEASE Bilateral 1996/1997   CYSTOSCOPY N/A 10/08/2021   Procedure: CYSTOSCOPY;  Surgeon: Lafonda Mosses, MD;  Location: [facility];  Service: Gynecology;  Laterality: N/A;   ROBOTIC ASSISTED TOTAL HYSTERECTOMY WITH BILATERAL SALPINGO OOPHERECTOMY N/A 10/08/2021   Procedure: XI ROBOTIC ASSISTED TOTAL HYSTERECTOMY WITH BILATERAL SALPINGO OOPHORECTOMY;SENTINEL LYMPH NODE INJECTION;  Surgeon: Lafonda Mosses, MD;  Location: [facility];  Service: Gynecology;  Laterality: N/A;   SQUAMOUS CELL CARCINOMA EXCISION Left 2017   Left jawline   TUBAL LIGATION  1980's    Family  History  Problem Relation Age of Onset   Endometrial cancer Mother    Diabetes Mother    Alzheimer's disease Father    Coronary artery disease Father    Diabetes Sister    COPD Sister    Diabetes Sister    Heart disease Brother    Other Brother        MVA    Parkinson's disease Brother    Heart disease Brother    Alcohol abuse Brother    Heart disease Brother    Parkinson's disease Maternal Aunt    Breast cancer Maternal Aunt    Colon cancer  Paternal Aunt    Lung cancer Paternal Uncle    Healthy Son    Healthy Son    Prostate cancer Neg Hx    Pancreatic cancer Neg Hx    Ovarian cancer Neg Hx     Social History   Socioeconomic History   Marital status: Married    Spouse name: Not on file   Number of children: Not on file   Years of education: Not on file   Highest education level: Not on file  Occupational History   Occupation: retired    Comment: Chiropractor  Tobacco Use   Smoking status: Never   Smokeless tobacco: Never  Vaping Use   Vaping Use: Never used  Substance and Sexual Activity   Alcohol use: Yes    Comment: ocasiona;   Drug use: Not Currently   Sexual activity: Not Currently  Other Topics Concern   Not on file  Social History Narrative   Not on file   Social Determinants of Health   Financial Resource Strain: Not on file  Food Insecurity: Not on file  Transportation Needs: Not on file  Physical Activity: Not on file  Stress: Not on file  Social Connections: Not on file    Current Medications:  Current Outpatient Medications:    ALPRAZolam (XANAX) 0.25 MG tablet, Take 0.25 mg by mouth 2 (two) times daily as needed for anxiety., Disp: , Rfl:    aspirin EC 81 MG tablet, Take 81 mg by mouth daily., Disp: , Rfl:    bisacodyl (DULCOLAX) 5 MG EC tablet, Take by mouth., Disp: , Rfl:    carbidopa-levodopa (SINEMET IR) 25-100 MG tablet, 1-1/2 pills at 4 AM, 1 pill at 7 AM, 1-1/2 pills at 11 AM, 1 pill at 3 PM, 1-1/2 pills at 7 PM daily, Disp: 675 tablet, Rfl: 2   Multiple Vitamin (MULTIVITAMIN) tablet, Take 1 tablet by mouth daily., Disp: , Rfl:    polyethylene glycol powder (GLYCOLAX/MIRALAX) 17 GM/SCOOP powder, Take 17 g by mouth every other day., Disp: , Rfl:   Review of Systems: + Fatigue, pelvic pain, back pain, muscle pain/cramp, easy bruising/bleeding, anxiety, depression Denies appetite changes, fevers, chills, unexplained weight changes. Denies hearing loss, neck lumps or masses,  mouth sores, ringing in ears or voice changes. Denies cough or wheezing.  Denies shortness of breath. Denies chest pain or palpitations. Denies leg swelling. Denies abdominal distention, pain, blood in stools, constipation, diarrhea, nausea, vomiting, or early satiety. Denies pain with intercourse, dysuria, frequency, hematuria or incontinence. Denies hot flashes, vaginal bleeding or vaginal discharge.   Denies itching, rash, or wounds. Denies dizziness, headaches, numbness or seizures. Denies swollen lymph nodes or glands. Denies confusion or decreased concentration.  Physical Exam: BP (!) 144/64 (BP Location: Left Arm, Patient Position: Sitting)   Pulse 72   Temp 97.7 F (36.5 C) (Oral)   Resp  16   Ht 5' 0.63" (1.54 m)   Wt 136 lb 9.6 oz (62 kg)   SpO2 98%   BMI 26.13 kg/m  General: Alert, oriented, no acute distress. HEENT: Normocephalic, atraumatic, sclera anicteric. Chest: Clear to auscultation bilaterally.  Wheezes or rhonchi. Cardiovascular: Regular rate and rhythm, no murmurs. Abdomen: soft, nontender.  Normoactive bowel sounds.  No masses or hepatosplenomegaly appreciated.  Well-healed incisions. Extremities: Grossly normal range of motion.  Warm, well perfused.  No edema bilaterally. Skin: No rashes or lesions noted. Lymphatics: No cervical, supraclavicular, or inguinal adenopathy. GU: Normal appearing external genitalia without erythema, excoriation, or lesions.  Speculum exam reveals moderately atrophic vaginal mucosa, some radiation changes noted at the vaginal apex.  Bimanual exam reveals no nodularity or masses.  Rectovaginal exam confirms findings.  Laboratory & Radiologic Studies: None new  Assessment & Plan: Ashley Neal is a 76 y.o. woman with Stage IB grade 1 mucinous endometrial adenocarcinoma who presents for follow-up after completion of adjuvant vaginal brachytherapy in 01/2022.   Patient is doing well from a cancer standpoint; she is NED on exam  today.  Unfortunately, she continues to struggle significantly with back pain.  I offered support.   We reviewed NCCN surveillance recommendations.  In the setting of high-intermediate risk, after completion of adjuvant treatment, I reviewed recommendation for surveillance visits every 3 months for 2-3 years.  She is scheduled to see her radiation oncologist at the beginning of October.  I will see her back for a visit in January.  We reviewed that these will entail an exam and a review of systems.  We discussed signs and symptoms that would be concerning for cancer recurrence and I stressed the importance of calling if she develops any of these.  Patient encouraged to continue using her vaginal dilator regularly.  22 minutes of total time was spent for this patient encounter, including preparation, face-to-face counseling with the patient and coordination of care, and documentation of the encounter.  Jeral Pinch, MD  Division of Gynecologic Oncology  Department of Obstetrics and Gynecology  [facility]

## 2022-04-21 NOTE — Patient Instructions (Addendum)
It was good to see you today.  I do not see or feel any evidence of cancer recurrence on your exam.  Please make sure that you are scheduled with your radiation oncologist for a visit in October. I will see you back for a visit 3 months after.  Please call back sometime in December to schedule a visit to see me in January.  As always, if you develop any new and concerning symptoms before your next visit, please call to see me sooner.

## 2022-04-22 DIAGNOSIS — F411 Generalized anxiety disorder: Secondary | ICD-10-CM | POA: Diagnosis not present

## 2022-04-22 DIAGNOSIS — M545 Low back pain, unspecified: Secondary | ICD-10-CM | POA: Diagnosis not present

## 2022-04-22 DIAGNOSIS — E7800 Pure hypercholesterolemia, unspecified: Secondary | ICD-10-CM | POA: Diagnosis not present

## 2022-04-22 DIAGNOSIS — Z79899 Other long term (current) drug therapy: Secondary | ICD-10-CM | POA: Diagnosis not present

## 2022-04-22 DIAGNOSIS — G8929 Other chronic pain: Secondary | ICD-10-CM | POA: Diagnosis not present

## 2022-04-22 DIAGNOSIS — G20 Parkinson's disease: Secondary | ICD-10-CM | POA: Diagnosis not present

## 2022-04-22 DIAGNOSIS — I10 Essential (primary) hypertension: Secondary | ICD-10-CM | POA: Diagnosis not present

## 2022-04-24 ENCOUNTER — Ambulatory Visit: Payer: PPO | Admitting: Physical Therapy

## 2022-04-24 DIAGNOSIS — M62838 Other muscle spasm: Secondary | ICD-10-CM

## 2022-04-24 DIAGNOSIS — R293 Abnormal posture: Secondary | ICD-10-CM

## 2022-04-24 DIAGNOSIS — R269 Unspecified abnormalities of gait and mobility: Secondary | ICD-10-CM

## 2022-04-24 DIAGNOSIS — M6281 Muscle weakness (generalized): Secondary | ICD-10-CM | POA: Diagnosis not present

## 2022-04-24 NOTE — Therapy (Signed)
OUTPATIENT PHYSICAL THERAPY FEMALE PELVIC TREATMENT   Patient Name: Ashley Neal MRN: [id number] DOB:1945/08/22, 76 y.o., female Today's Date: 04/24/2022   PT End of Session - 04/24/22 1148     Visit Number 9    Date for PT Re-Evaluation 05/28/22    Authorization Type healthstream    PT Start Time 1145    PT Stop Time 1230    PT Time Calculation (min) 45 min    Activity Tolerance Patient limited by pain    Behavior During Therapy [facility] for tasks assessed/performed              Past Medical History:  Diagnosis Date   Anxiety    Depression    GERD (gastroesophageal reflux disease)    Hypertension    Neuromuscular disorder ([place])    Osteoarthritis    Osteopenia    Parkinson disease ([place])    Pneumonia    Tremors of nervous system    legs   uterine ca 10/08/2021   also endometrial cancer   Past Surgical History:  Procedure Laterality Date   CARPAL TUNNEL RELEASE Bilateral 1996/1997   CYSTOSCOPY N/A 10/08/2021   Procedure: CYSTOSCOPY;  Surgeon: Lafonda Mosses, MD;  Location: [facility];  Service: Gynecology;  Laterality: N/A;   ROBOTIC ASSISTED TOTAL HYSTERECTOMY WITH BILATERAL SALPINGO OOPHERECTOMY N/A 10/08/2021   Procedure: XI ROBOTIC ASSISTED TOTAL HYSTERECTOMY WITH BILATERAL SALPINGO OOPHORECTOMY;SENTINEL LYMPH NODE INJECTION;  Surgeon: Lafonda Mosses, MD;  Location: [facility];  Service: Gynecology;  Laterality: N/A;   SQUAMOUS CELL CARCINOMA EXCISION Left 2017   Left jawline   TUBAL LIGATION  1980's   Patient Active Problem List   Diagnosis Date Noted   Endometrial cancer ([place]) 10/16/2021   Complex atypical endometrial hyperplasia 09/27/2021   Primary osteoarthritis of both hands 05/14/2020   Primary osteoarthritis of both feet 05/14/2020   Age-related osteoporosis without current pathological fracture 05/14/2020   Prediabetes 05/14/2020   Essential hypertension 05/14/2020   History of hyperlipidemia 05/14/2020   History of squamous cell carcinoma  05/14/2020   Parkinsonism ([place]) 05/14/2020    PCP: not listed in chart  REFERRING PROVIDER: Faustino Congress, NP  REFERRING DIAG: M53.3 (ICD-10-CM) - Sacrococcygeal disorders, not elsewhere classified R32 (ICD-10-CM) - Unspecified urinary incontinence  THERAPY DIAG:  Muscle weakness (generalized)  Other muscle spasm  Abnormality of gait and mobility  Abnormal posture  Rationale for Evaluation and Treatment Rehabilitation  ONSET DATE: 1 year ago  SUBJECTIVE:  SUBJECTIVE STATEMENT: Pt states she had several good days of lower pain and improved mobility from last session 9/6-9/12 having no more than 3/10 pain. Pain began returning 9/13 (wed) and has gotten high.   Fluid intake: "I drink a lot of water all day"   PAIN:  Are you having pain? Yes NPRS scale:5/10 right now  Pain location:  posterior at sacrum and coccyx and low back  Pain type: burning and sharp Pain description: constant   Aggravating factors: sitting, any pressure at low back Relieving factors: resting at sidelying or prone  PRECAUTIONS: Fall  WEIGHT BEARING RESTRICTIONS No  FALLS:  Has patient fallen in last 6 months? Yes. Number of falls 1  LIVING ENVIRONMENT: Lives with: lives alone Lives in: House/apartment Stairs: Yes: External: 1 steps; on left going up Has following equipment at home: None  OCCUPATION: retired  PLOF: Independent  PATIENT GOALS to have less pain  PERTINENT HISTORY:  ROBOTIC ASSISTED PELVIC LYMPH NODE DISSECTION, PARA-AORTIC LYMPHADENECTOMY, CYSTOSCOPY 11/06/21 due to endometrial CA;robotic assisted total hysterectomy and bilateral salpingo-oophorectomy on 10/08/21; parkinsons, hypertension, hyperlipidemia, history of colitis, osteoarthritis, osteoporosis, reflux disease, anxiety,  overweight  currently in radiation, allergic to tape MRI findinds 08/31/21: incidental finding of CA, osteoarthritis of bilateral hips.large high-grade partial-thickness tear of the left hamstring origin.  Partial-thickness tear of the left gluteus medius tendon insertion.  Mild osteoarthritis of bilateral SI joints also noted. Sexual abuse: No  BOWEL MOVEMENT Pain with bowel movement: No Type of bowel movement:Type (Bristol Stool Scale) 2-5, Frequency every third day, and Strain Yes Fully empty rectum: No Leakage: No Pads: No Fiber supplement: Yes: miralax, ducalax  URINATION Pain with urination: No Fully empty bladder: No has had recent UTIs that limits this Stream: Strong Urgency: Yes:   Frequency: every 2 hours Leakage:  none Pads: No  INTERCOURSE Pain with intercourse:  not painful Ability to have vaginal penetration:  Yes:   Climax: not painful  Marinoff Scale: 0/3  PREGNANCY Vaginal deliveries 2 Tearing No C-section deliveries 2 Currently pregnant No  PROLAPSE None    OBJECTIVE:   DIAGNOSTIC FINDINGS:    COGNITION:  Overall cognitive status: Within functional limits for tasks assessed     SENSATION:  Light touch: Appears intact  Proprioception: Appears intact  MUSCLE LENGTH: Bil hamstrings and adductors by 50%  LUMBAR SPECIAL TESTS:  SI Compression/distraction test: Negative, FABER test: Negative, and Gaenslen's test: Negative   GAIT: Distance walked: 300' Assistive device utilized: None Level of assistance: Modified independence Comments: shuffled,                POSTURE: rounded shoulders, forward head, and posterior pelvic tilt   LUMBARAROM/PROM  A/PROM A/PROM  eval  Flexion Limited by 75%  Extension Limited by 25%  Right lateral flexion Limited by 75%  Left lateral flexion Limited by 75%  Right rotation Limited by 50%  Left rotation Limited by 50%   (Blank rows = not tested)  LOWER EXTREMITY ROM:  WFL but with pain at bil hip  abduction and extension  LOWER EXTREMITY MMT:  Bil hips 3+/5 grossly; knees 4/5 and ankles 5/5   PALPATION:   General  TTP at all quadrants of abdomen with fascial restrictions noted as well though Rt side worse than LT. TTP at L4-S1, midline sacrum, coccyx very painful with pt reporting she feels this is where her pain starts and then radiates in bil directions to mid gluteals.  External Perineal Exam pt deferred and would like to think about it for next session                              Internal Pelvic Floor pt deferred and would like to think about it for next session   Patient confirms identification and approves PT to assess internal pelvic floor and treatment No (at eval)  Yes 03/06/22   PELVIC MMT:   MMT 03/06/22   Vaginal 3/5; 8s; 5 reps  Internal Anal Sphincter   External Anal Sphincter   Puborectalis   Diastasis Recti   (Blank rows = not tested)        TONE: pt deferred and would like to think about it for next session   PROLAPSE: pt deferred and would like to think about it for next session   TODAY'S TREATMENT   04/24/2022; Pt consented to internal rectal treatment today to see if see had better release internally. Initially manual work completed externally at [place] and Lt side of coccyx with more tension at Lt side compared to Rt this helped release a bit. Progressed into internal post glove change and hand hygiene. Pt denied pain with insertion of one gloved digit, did have tension and TTP at 12-10  on clock face at coccyx and Lt side. PT provided gentle over pressure and stretching at this are with pt reporting feeling a slight release but not significant. PT removed digit, completed hand hygiene and glove change. PT completed external manual work again at [place] coccyx with noted tension release at Lt musculature at coccyx. Pt reports she could feel this is where she has her pain and could feel release with work here today. Pt tolerated well. Sacral  springing completed 2x5 and then x5 at Lt side of coccyx instead of central sacrum. Pt reports this felt the most release post manual work and improved pain from 5/10 >2/10 at end of session.        PATIENT EDUCATION:  Education details: [email or web address] Person educated: Patient Education method: Consulting civil engineer, Demonstration, Tactile cues, Verbal cues, and Handouts Education comprehension: verbalized understanding and returned demonstration   HOME EXERCISE PROGRAM: Access Code: [email or web address]  ASSESSMENT:  CLINICAL IMPRESSION: Patient continues to endorse improved pain levels for several days post treatments, but pain does return after this but overall she is no longer having 8-10/10 pain now usually 5-7/10 at highest and has several days after treatment of 3/10. pt session focused on manual work internally rectally and externally. Pt tolerated well and reports feeling better at end of session pain at 2/10. Pt would benefit from additional PT to further address deficits.     OBJECTIVE IMPAIRMENTS decreased activity tolerance, decreased balance, decreased coordination, decreased endurance, decreased mobility, difficulty walking, decreased strength, increased fascial restrictions, increased muscle spasms, impaired flexibility, improper body mechanics, postural dysfunction, and pain.   ACTIVITY LIMITATIONS carrying, lifting, standing, stairs, and locomotion level  PARTICIPATION LIMITATIONS: meal prep, cleaning, laundry, community activity, and yard work  PERSONAL FACTORS Age, Past/current experiences, Time since onset of injury/illness/exacerbation, and 1 comorbidity: medical history  are also affecting patient's functional outcome.   REHAB POTENTIAL: Good  CLINICAL DECISION MAKING: Evolving/moderate complexity  EVALUATION COMPLEXITY: Moderate   GOALS: Goals reviewed with patient? Yes  SHORT TERM GOALS: Target date: 03/25/2022  Pt to be I with HEP.  Baseline: Goal status: MET  2.  Pt will  report 25% reduction of pain due  to improvements in posture, strength, and muscle length  Baseline:  Goal status: on going  3.  Pt to be able to perform 10 STS with minimal use of bil UE without LOB or sway due to improved strength and tolerance to activity. Baseline:  Goal status: MET  4.  Pt to demonstrate improved gait mechanics with midline upright posture, increased step height and improved cadence for at least 75' to decrease fall risk and improve safety with ambulating to/from bathroom.  Baseline:  Goal status: MET    LONG TERM GOALS: Target date:  05/28/22    Pt to be I with advanced HEP.  Baseline:  Goal status: on going  2.  Pt will report 50% reduction of pain due to improvements in posture, strength, and muscle length  Baseline:  Goal status: on going  3.  Pt to demonstrate improved gait mechanics with midline upright posture, increased step height and improved cadence for at least 200' to decrease fall risk and improve safety with ambulating to/from bathroom.  Baseline:  Goal status: on going  4.  Pt to demonstrate at least 5/5 bil hip strength for improved pelvic stability and functional squats without leakage.  Baseline:  Goal status: on going  5.  Pt to demonstrate at least 4/5 pelvic floor strength with ability to fully relax between contractions for improved pelvic stability  and decreased strain at pelvic floor Baseline:  Goal status: on going  6.  Pt to be I with bowel and bladder voiding techniques to decrease need to strain and feel fully empty. Baseline:  Goal status: on going  PLAN: PT FREQUENCY: 1x/week  PT DURATION:  8 sessions  PLANNED INTERVENTIONS: Therapeutic exercises, Therapeutic activity, Neuromuscular re-education, Balance training, Gait training, Patient/Family education, Self Care, Joint mobilization, Aquatic Therapy, Dry Needling, Spinal mobilization, Cryotherapy, Moist heat, Manual lymph drainage, scar mobilization, Taping,  Biofeedback, and Manual therapy  PLAN FOR NEXT SESSION: internal if needed and pt consents, breathing mechanics, voiding mechanics as needed, abdominal/hip/spine stretching and strengthening  Stacy Gardner, PT, DPT 04/25/2311:37 PM

## 2022-04-25 DIAGNOSIS — F4323 Adjustment disorder with mixed anxiety and depressed mood: Secondary | ICD-10-CM | POA: Diagnosis not present

## 2022-04-28 DIAGNOSIS — M47816 Spondylosis without myelopathy or radiculopathy, lumbar region: Secondary | ICD-10-CM | POA: Diagnosis not present

## 2022-04-28 DIAGNOSIS — M533 Sacrococcygeal disorders, not elsewhere classified: Secondary | ICD-10-CM | POA: Diagnosis not present

## 2022-04-30 DIAGNOSIS — M533 Sacrococcygeal disorders, not elsewhere classified: Secondary | ICD-10-CM | POA: Diagnosis not present

## 2022-05-03 DIAGNOSIS — M533 Sacrococcygeal disorders, not elsewhere classified: Secondary | ICD-10-CM | POA: Diagnosis not present

## 2022-05-08 ENCOUNTER — Ambulatory Visit: Payer: PPO | Admitting: Physical Therapy

## 2022-05-08 ENCOUNTER — Encounter: Payer: Self-pay | Admitting: Physical Therapy

## 2022-05-08 DIAGNOSIS — F32A Depression, unspecified: Secondary | ICD-10-CM | POA: Diagnosis not present

## 2022-05-08 DIAGNOSIS — C541 Malignant neoplasm of endometrium: Secondary | ICD-10-CM | POA: Diagnosis not present

## 2022-05-08 DIAGNOSIS — C55 Malignant neoplasm of uterus, part unspecified: Secondary | ICD-10-CM | POA: Diagnosis not present

## 2022-05-08 DIAGNOSIS — M1990 Unspecified osteoarthritis, unspecified site: Secondary | ICD-10-CM | POA: Diagnosis not present

## 2022-05-08 DIAGNOSIS — F4323 Adjustment disorder with mixed anxiety and depressed mood: Secondary | ICD-10-CM | POA: Diagnosis not present

## 2022-05-08 DIAGNOSIS — R279 Unspecified lack of coordination: Secondary | ICD-10-CM

## 2022-05-08 DIAGNOSIS — Z79899 Other long term (current) drug therapy: Secondary | ICD-10-CM | POA: Diagnosis not present

## 2022-05-08 DIAGNOSIS — Z08 Encounter for follow-up examination after completed treatment for malignant neoplasm: Secondary | ICD-10-CM | POA: Diagnosis not present

## 2022-05-08 DIAGNOSIS — M6281 Muscle weakness (generalized): Secondary | ICD-10-CM

## 2022-05-08 DIAGNOSIS — K219 Gastro-esophageal reflux disease without esophagitis: Secondary | ICD-10-CM | POA: Diagnosis not present

## 2022-05-08 DIAGNOSIS — Z923 Personal history of irradiation: Secondary | ICD-10-CM | POA: Diagnosis not present

## 2022-05-08 DIAGNOSIS — M8580 Other specified disorders of bone density and structure, unspecified site: Secondary | ICD-10-CM | POA: Diagnosis not present

## 2022-05-08 DIAGNOSIS — I10 Essential (primary) hypertension: Secondary | ICD-10-CM | POA: Diagnosis not present

## 2022-05-08 DIAGNOSIS — G20 Parkinson's disease: Secondary | ICD-10-CM | POA: Diagnosis not present

## 2022-05-08 DIAGNOSIS — Z8049 Family history of malignant neoplasm of other genital organs: Secondary | ICD-10-CM | POA: Diagnosis not present

## 2022-05-08 DIAGNOSIS — E785 Hyperlipidemia, unspecified: Secondary | ICD-10-CM | POA: Diagnosis not present

## 2022-05-08 DIAGNOSIS — R293 Abnormal posture: Secondary | ICD-10-CM

## 2022-05-08 DIAGNOSIS — Z7982 Long term (current) use of aspirin: Secondary | ICD-10-CM | POA: Diagnosis not present

## 2022-05-08 NOTE — Therapy (Signed)
OUTPATIENT PHYSICAL THERAPY FEMALE PELVIC TREATMENT   Patient Name: Ashley Neal MRN: [id number] DOB:07-May-1946, 77 y.o., female Today's Date: 05/08/2022   PT End of Session - 05/08/22 0850     Visit Number 10    Date for PT Re-Evaluation 05/28/22    Authorization Type healthstream    PT Start Time 0845    PT Stop Time 0923   pt needed to leave early to make another appt   PT Time Calculation (min) 38 min              Past Medical History:  Diagnosis Date   Anxiety    Depression    GERD (gastroesophageal reflux disease)    Hypertension    Neuromuscular disorder ([place])    Osteoarthritis    Osteopenia    Parkinson disease ([place])    Pneumonia    Tremors of nervous system    legs   uterine ca 10/08/2021   also endometrial cancer   Past Surgical History:  Procedure Laterality Date   CARPAL TUNNEL RELEASE Bilateral 1996/1997   CYSTOSCOPY N/A 10/08/2021   Procedure: CYSTOSCOPY;  Surgeon: Lafonda Mosses, MD;  Location: [facility];  Service: Gynecology;  Laterality: N/A;   ROBOTIC ASSISTED TOTAL HYSTERECTOMY WITH BILATERAL SALPINGO OOPHERECTOMY N/A 10/08/2021   Procedure: XI ROBOTIC ASSISTED TOTAL HYSTERECTOMY WITH BILATERAL SALPINGO OOPHORECTOMY;SENTINEL LYMPH NODE INJECTION;  Surgeon: Lafonda Mosses, MD;  Location: [facility];  Service: Gynecology;  Laterality: N/A;   SQUAMOUS CELL CARCINOMA EXCISION Left 2017   Left jawline   TUBAL LIGATION  1980's   Patient Active Problem List   Diagnosis Date Noted   Endometrial cancer ([address]) 10/16/2021   Complex atypical endometrial hyperplasia 09/27/2021   Primary osteoarthritis of both hands 05/14/2020   Primary osteoarthritis of both feet 05/14/2020   Age-related osteoporosis without current pathological fracture 05/14/2020   Prediabetes 05/14/2020   Essential hypertension 05/14/2020   History of hyperlipidemia 05/14/2020   History of squamous cell carcinoma 05/14/2020   Parkinsonism ([place]) 05/14/2020    PCP: not listed  in chart  REFERRING PROVIDER: Faustino Congress, NP  REFERRING DIAG: M53.3 (ICD-10-CM) - Sacrococcygeal disorders, not elsewhere classified R32 (ICD-10-CM) - Unspecified urinary incontinence  THERAPY DIAG:  Muscle weakness (generalized)  Abnormal posture  Unspecified lack of coordination  Rationale for Evaluation and Treatment Rehabilitation  ONSET DATE: 1 year ago  SUBJECTIVE:                                                                                                                                                                                           SUBJECTIVE STATEMENT: Pt  reports last  week had a lot of pain and was able to get an appointment at the pain clinic for a shot at [place], had a lot of pain after shot but the day after felt so much better "almost normal", notes it always gets worse in the evenings but the day and mornings where much better. "It feels like its more diffuse and much more tolerable".   Fluid intake: "I drink a lot of water all day"   PAIN:  Are you having pain? Yes NPRS scale:5/10 right now  Pain location:  posterior at sacrum and coccyx and low back  Pain type: burning and sharp Pain description: constant   Aggravating factors: sitting, any pressure at low back Relieving factors: resting at sidelying or prone  PRECAUTIONS: Fall  WEIGHT BEARING RESTRICTIONS No  FALLS:  Has patient fallen in last 6 months? Yes. Number of falls 1  LIVING ENVIRONMENT: Lives with: lives alone Lives in: House/apartment Stairs: Yes: External: 1 steps; on left going up Has following equipment at home: None  OCCUPATION: retired  PLOF: Independent  PATIENT GOALS to have less pain  PERTINENT HISTORY:  ROBOTIC ASSISTED PELVIC LYMPH NODE DISSECTION, PARA-AORTIC LYMPHADENECTOMY, CYSTOSCOPY 11/06/21 due to endometrial CA;robotic assisted total hysterectomy and bilateral salpingo-oophorectomy on 10/08/21; parkinsons, hypertension, hyperlipidemia,  history of colitis, osteoarthritis, osteoporosis, reflux disease, anxiety, overweight  currently in radiation, allergic to tape MRI findinds 08/31/21: incidental finding of CA, osteoarthritis of bilateral hips.large high-grade partial-thickness tear of the left hamstring origin.  Partial-thickness tear of the left gluteus medius tendon insertion.  Mild osteoarthritis of bilateral SI joints also noted. Sexual abuse: No  BOWEL MOVEMENT Pain with bowel movement: No Type of bowel movement:Type (Bristol Stool Scale) 2-5, Frequency every third day, and Strain Yes Fully empty rectum: No Leakage: No Pads: No Fiber supplement: Yes: miralax, ducalax  URINATION Pain with urination: No Fully empty bladder: No has had recent UTIs that limits this Stream: Strong Urgency: Yes:   Frequency: every 2 hours Leakage:  none Pads: No  INTERCOURSE Pain with intercourse:  not painful Ability to have vaginal penetration:  Yes:   Climax: not painful  Marinoff Scale: 0/3  PREGNANCY Vaginal deliveries 2 Tearing No C-section deliveries 2 Currently pregnant No  PROLAPSE None    OBJECTIVE:   DIAGNOSTIC FINDINGS:    COGNITION:  Overall cognitive status: Within functional limits for tasks assessed     SENSATION:  Light touch: Appears intact  Proprioception: Appears intact  MUSCLE LENGTH: Bil hamstrings and adductors by 50%  LUMBAR SPECIAL TESTS:  SI Compression/distraction test: Negative, FABER test: Negative, and Gaenslen's test: Negative   GAIT: Distance walked: 300' Assistive device utilized: None Level of assistance: Modified independence Comments: shuffled,                POSTURE: rounded shoulders, forward head, and posterior pelvic tilt   LUMBARAROM/PROM  A/PROM A/PROM  eval  Flexion Limited by 75%  Extension Limited by 25%  Right lateral flexion Limited by 75%  Left lateral flexion Limited by 75%  Right rotation Limited by 50%  Left rotation Limited by 50%   (Blank  rows = not tested)  LOWER EXTREMITY ROM:  WFL but with pain at bil hip abduction and extension  LOWER EXTREMITY MMT:  Bil hips 3+/5 grossly; knees 4/5 and ankles 5/5   PALPATION:   General  TTP at all quadrants of abdomen with fascial restrictions noted as well though Rt side worse than LT. TTP at L4-S1, midline sacrum, coccyx very painful  with pt reporting she feels this is where her pain starts and then radiates in bil directions to mid gluteals.                 External Perineal Exam pt deferred and would like to think about it for next session                              Internal Pelvic Floor pt deferred and would like to think about it for next session   Patient confirms identification and approves PT to assess internal pelvic floor and treatment No (at eval)  Yes 03/06/22   PELVIC MMT:   MMT 03/06/22   Vaginal 3/5; 8s; 5 reps  Internal Anal Sphincter   External Anal Sphincter   Puborectalis   Diastasis Recti   (Blank rows = not tested)        TONE: pt deferred and would like to think about it for next session   PROLAPSE: pt deferred and would like to think about it for next session   TODAY'S TREATMENT   05/08/2022:  Manual: external manual work again at [employer] and Rt coccyx with noted tension release at Lt musculature at coccyx. Pt reports she could feel this is where she has her pain and could feel release with work here today. Pt tolerated well. Sacral springing completed at Lt side of coccyx and  central sacrum. Pt tolerated slightly more pressure with tissue release today, reported greatly less pain and discomfort compared to previous sessions and having less pain in general since shot but has increased slightly in the days following the shot but not nearly as bad as prior. Pt reports her pain used to be highest at 9-10/10 now highest 6-7/10 and usually around 4-5/10. Pt noted greatly less tension today at Lt side compared to previous sessions and main focus of treatment  today at Rt side of coccyx and sacrum with good effect as pt had noted release.   04/24/2022; Pt consented to internal rectal treatment today to see if see had better release internally. Initially manual work completed externally at [place] and Lt side of coccyx with more tension at Lt side compared to Rt this helped release a bit. Progressed into internal post glove change and hand hygiene. Pt denied pain with insertion of one gloved digit, did have tension and TTP at 12-10  on clock face at coccyx and Lt side. PT provided gentle over pressure and stretching at this are with pt reporting feeling a slight release but not significant. PT removed digit, completed hand hygiene and glove change. PT completed external manual work again at [place] coccyx with noted tension release at Lt musculature at coccyx. Pt reports she could feel this is where she has her pain and could feel release with work here today. Pt tolerated well. Sacral springing completed 2x5 and then x5 at Lt side of coccyx instead of central sacrum. Pt reports this felt the most release post manual work and improved pain from 5/10 >2/10 at end of session.        PATIENT EDUCATION:  Education details: [email or web address] Person educated: Patient Education method: Consulting civil engineer, Demonstration, Tactile cues, Verbal cues, and Handouts Education comprehension: verbalized understanding and returned demonstration   HOME EXERCISE PROGRAM: Access Code: [email or web address]  ASSESSMENT:  CLINICAL IMPRESSION: Patient reports she had great relief after pain shot at tailbone per pt but it has returned slightly but not as  bad. Pt pleased that generally her pain is no higher than 4/10 but will have highest pain at 7/10. Which is lower than start of PT.  pt session focused on manual work externally at coccyx and sacrum. Pt tolerated well and tolerated more mobility and gentle tissue release today without increase in pain. Pt would benefit from additional PT to further address  deficits.     OBJECTIVE IMPAIRMENTS decreased activity tolerance, decreased balance, decreased coordination, decreased endurance, decreased mobility, difficulty walking, decreased strength, increased fascial restrictions, increased muscle spasms, impaired flexibility, improper body mechanics, postural dysfunction, and pain.   ACTIVITY LIMITATIONS carrying, lifting, standing, stairs, and locomotion level  PARTICIPATION LIMITATIONS: meal prep, cleaning, laundry, community activity, and yard work  PERSONAL FACTORS Age, Past/current experiences, Time since onset of injury/illness/exacerbation, and 1 comorbidity: medical history  are also affecting patient's functional outcome.   REHAB POTENTIAL: Good  CLINICAL DECISION MAKING: Evolving/moderate complexity  EVALUATION COMPLEXITY: Moderate   GOALS: Goals reviewed with patient? Yes  SHORT TERM GOALS: Target date: 03/25/2022  Pt to be I with HEP.  Baseline: Goal status: MET  2.  Pt will report 25% reduction of pain due to improvements in posture, strength, and muscle length  Baseline:  Goal status: on going  3.  Pt to be able to perform 10 STS with minimal use of bil UE without LOB or sway due to improved strength and tolerance to activity. Baseline:  Goal status: MET  4.  Pt to demonstrate improved gait mechanics with midline upright posture, increased step height and improved cadence for at least 75' to decrease fall risk and improve safety with ambulating to/from bathroom.  Baseline:  Goal status: MET    LONG TERM GOALS: Target date:  05/28/22    Pt to be I with advanced HEP.  Baseline:  Goal status: on going  2.  Pt will report 50% reduction of pain due to improvements in posture, strength, and muscle length  Baseline:  Goal status: on going  3.  Pt to demonstrate improved gait mechanics with midline upright posture, increased step height and improved cadence for at least 200' to decrease fall risk and improve safety  with ambulating to/from bathroom.  Baseline:  Goal status: on going  4.  Pt to demonstrate at least 5/5 bil hip strength for improved pelvic stability and functional squats without leakage.  Baseline:  Goal status: on going  5.  Pt to demonstrate at least 4/5 pelvic floor strength with ability to fully relax between contractions for improved pelvic stability  and decreased strain at pelvic floor Baseline:  Goal status: on going  6.  Pt to be I with bowel and bladder voiding techniques to decrease need to strain and feel fully empty. Baseline:  Goal status: on going  PLAN: PT FREQUENCY: 1x/week  PT DURATION:  8 sessions  PLANNED INTERVENTIONS: Therapeutic exercises, Therapeutic activity, Neuromuscular re-education, Balance training, Gait training, Patient/Family education, Self Care, Joint mobilization, Aquatic Therapy, Dry Needling, Spinal mobilization, Cryotherapy, Moist heat, Manual lymph drainage, scar mobilization, Taping, Biofeedback, and Manual therapy  PLAN FOR NEXT SESSION: internal if needed and pt consents, breathing mechanics, voiding mechanics as needed, abdominal/hip/spine stretching and strengthening  Stacy Gardner, PT, DPT 09/28/239:35 AM

## 2022-05-13 ENCOUNTER — Ambulatory Visit: Payer: PPO | Attending: Family Medicine | Admitting: Physical Therapy

## 2022-05-13 DIAGNOSIS — R279 Unspecified lack of coordination: Secondary | ICD-10-CM | POA: Diagnosis not present

## 2022-05-13 DIAGNOSIS — M6281 Muscle weakness (generalized): Secondary | ICD-10-CM | POA: Insufficient documentation

## 2022-05-13 DIAGNOSIS — M62838 Other muscle spasm: Secondary | ICD-10-CM | POA: Insufficient documentation

## 2022-05-13 DIAGNOSIS — R293 Abnormal posture: Secondary | ICD-10-CM | POA: Insufficient documentation

## 2022-05-13 DIAGNOSIS — R269 Unspecified abnormalities of gait and mobility: Secondary | ICD-10-CM | POA: Diagnosis not present

## 2022-05-13 NOTE — Therapy (Signed)
OUTPATIENT PHYSICAL THERAPY FEMALE PELVIC TREATMENT   Patient Name: NATACIA CHAISSON MRN: [id number] DOB:1946-04-16, 76 y.o., female Today's Date: 05/13/2022   PT End of Session - 05/13/22 1231     Visit Number 11    Date for PT Re-Evaluation 05/28/22    Authorization Type healthstream    PT Start Time 1230    PT Stop Time 1310    PT Time Calculation (min) 40 min    Activity Tolerance Patient limited by pain    Behavior During Therapy [facility] for tasks assessed/performed              Past Medical History:  Diagnosis Date   Anxiety    Depression    GERD (gastroesophageal reflux disease)    Hypertension    Neuromuscular disorder ([place])    Osteoarthritis    Osteopenia    Parkinson disease ([place])    Pneumonia    Tremors of nervous system    legs   uterine ca 10/08/2021   also endometrial cancer   Past Surgical History:  Procedure Laterality Date   CARPAL TUNNEL RELEASE Bilateral 1996/1997   CYSTOSCOPY N/A 10/08/2021   Procedure: CYSTOSCOPY;  Surgeon: Lafonda Mosses, MD;  Location: [facility];  Service: Gynecology;  Laterality: N/A;   ROBOTIC ASSISTED TOTAL HYSTERECTOMY WITH BILATERAL SALPINGO OOPHERECTOMY N/A 10/08/2021   Procedure: XI ROBOTIC ASSISTED TOTAL HYSTERECTOMY WITH BILATERAL SALPINGO OOPHORECTOMY;SENTINEL LYMPH NODE INJECTION;  Surgeon: Lafonda Mosses, MD;  Location: [facility];  Service: Gynecology;  Laterality: N/A;   SQUAMOUS CELL CARCINOMA EXCISION Left 2017   Left jawline   TUBAL LIGATION  1980's   Patient Active Problem List   Diagnosis Date Noted   Endometrial cancer ([place]) 10/16/2021   Complex atypical endometrial hyperplasia 09/27/2021   Primary osteoarthritis of both hands 05/14/2020   Primary osteoarthritis of both feet 05/14/2020   Age-related osteoporosis without current pathological fracture 05/14/2020   Prediabetes 05/14/2020   Essential hypertension 05/14/2020   History of hyperlipidemia 05/14/2020   History of squamous cell carcinoma  05/14/2020   Parkinsonism 05/14/2020    PCP: not listed in chart  REFERRING PROVIDER: Faustino Congress, NP  REFERRING DIAG: M53.3 (ICD-10-CM) - Sacrococcygeal disorders, not elsewhere classified R32 (ICD-10-CM) - Unspecified urinary incontinence  THERAPY DIAG:  Muscle weakness (generalized)  Unspecified lack of coordination  Abnormal posture  Rationale for Evaluation and Treatment Rehabilitation  ONSET DATE: 1 year ago  SUBJECTIVE:  SUBJECTIVE STATEMENT: Pt states her pain continues to slowly get better, worse pain this week 5/10, past two nights hasn't had spike in pain and decline in energy and pleased with this. Pt reports she has been able to attempt short walks now and limited with fatigue and increase in pain but pleased she was able to complete as she hasn't been able to do it at all. Pt reports after manual work with PT she feels a little sore after for a few hours but then has a large decrease in pain the next day.   Fluid intake: "I drink a lot of water all day"   PAIN:  Are you having pain? Yes NPRS scale:3-5/10 right now  Pain location:  posterior at sacrum and coccyx and low back  Pain type: burning and sharp Pain description: constant   Aggravating factors: sitting, any pressure at low back Relieving factors: resting at sidelying or prone  PRECAUTIONS: Fall  WEIGHT BEARING RESTRICTIONS No  FALLS:  Has patient fallen in last 6 months? Yes. Number of falls 1  LIVING ENVIRONMENT: Lives with: lives alone Lives in: House/apartment Stairs: Yes: External: 1 steps; on left going up Has following equipment at home: None  OCCUPATION: retired  PLOF: Independent  PATIENT GOALS to have less pain  PERTINENT HISTORY:  ROBOTIC ASSISTED PELVIC LYMPH NODE DISSECTION,  PARA-AORTIC LYMPHADENECTOMY, CYSTOSCOPY 11/06/21 due to endometrial CA;robotic assisted total hysterectomy and bilateral salpingo-oophorectomy on 10/08/21; parkinsons, hypertension, hyperlipidemia, history of colitis, osteoarthritis, osteoporosis, reflux disease, anxiety, overweight  currently in radiation, allergic to tape MRI findinds 08/31/21: incidental finding of CA, osteoarthritis of bilateral hips.large high-grade partial-thickness tear of the left hamstring origin.  Partial-thickness tear of the left gluteus medius tendon insertion.  Mild osteoarthritis of bilateral SI joints also noted. Sexual abuse: No  BOWEL MOVEMENT Pain with bowel movement: No Type of bowel movement:Type (Bristol Stool Scale) 2-5, Frequency every third day, and Strain Yes Fully empty rectum: No Leakage: No Pads: No Fiber supplement: Yes: miralax, ducalax  URINATION Pain with urination: No Fully empty bladder: No has had recent UTIs that limits this Stream: Strong Urgency: Yes:   Frequency: every 2 hours Leakage:  none Pads: No  INTERCOURSE Pain with intercourse:  not painful Ability to have vaginal penetration:  Yes:   Climax: not painful  Marinoff Scale: 0/3  PREGNANCY Vaginal deliveries 2 Tearing No C-section deliveries 2 Currently pregnant No  PROLAPSE None    OBJECTIVE:   DIAGNOSTIC FINDINGS:    COGNITION:  Overall cognitive status: Within functional limits for tasks assessed     SENSATION:  Light touch: Appears intact  Proprioception: Appears intact  MUSCLE LENGTH: Bil hamstrings and adductors by 50%  LUMBAR SPECIAL TESTS:  SI Compression/distraction test: Negative, FABER test: Negative, and Gaenslen's test: Negative   GAIT: Distance walked: 300' Assistive device utilized: None Level of assistance: Modified independence Comments: shuffled,                POSTURE: rounded shoulders, forward head, and posterior pelvic tilt   LUMBARAROM/PROM  A/PROM A/PROM  eval   Flexion Limited by 75%  Extension Limited by 25%  Right lateral flexion Limited by 75%  Left lateral flexion Limited by 75%  Right rotation Limited by 50%  Left rotation Limited by 50%   (Blank rows = not tested)  LOWER EXTREMITY ROM:  WFL but with pain at bil hip abduction and extension  LOWER EXTREMITY MMT:  Bil hips 3+/5 grossly; knees 4/5 and ankles 5/5  PALPATION:   General  TTP at all quadrants of abdomen with fascial restrictions noted as well though Rt side worse than LT. TTP at L4-S1, midline sacrum, coccyx very painful with pt reporting she feels this is where her pain starts and then radiates in bil directions to mid gluteals.                 External Perineal Exam pt deferred and would like to think about it for next session                              Internal Pelvic Floor pt deferred and would like to think about it for next session   Patient confirms identification and approves PT to assess internal pelvic floor and treatment No (at eval)  Yes 03/06/22   PELVIC MMT:   MMT 03/06/22   Vaginal 3/5; 8s; 5 reps  Internal Anal Sphincter   External Anal Sphincter   Puborectalis   Diastasis Recti   (Blank rows = not tested)        TONE: pt deferred and would like to think about it for next session   PROLAPSE: pt deferred and would like to think about it for next session   TODAY'S TREATMENT   05/13/22: Manual: external at sacrum and coccyx in Lt sidelying with majority of tension noted at Rt side of coccyx and manual focus here. Gentle tissue release and soft tissue massage at this area and sacral springing completed. Pt reports she doesn't tolerate prone lying well and needed to remain in sidelying. Pt reports pain overall feels much better with palpation compared to previous session and continues to note lower pain levels each week with lowering of highest pain levels between appointments.   05/08/2022:  Manual: external manual work again at [employer] and Rt  coccyx with noted tension release at Lt musculature at coccyx. Pt reports she could feel this is where she has her pain and could feel release with work here today. Pt tolerated well. Sacral springing completed at Lt side of coccyx and  central sacrum. Pt tolerated slightly more pressure with tissue release today, reported greatly less pain and discomfort compared to previous sessions and having less pain in general since shot but has increased slightly in the days following the shot but not nearly as bad as prior. Pt reports her pain used to be highest at 9-10/10 now highest 6-7/10 and usually around 4-5/10. Pt noted greatly less tension today at Lt side compared to previous sessions and main focus of treatment today at Rt side of coccyx and sacrum with good effect as pt had noted release.      PATIENT EDUCATION:  Education details: VYMKAKYT Person educated: Patient Education method: Consulting civil engineer, Demonstration, Tactile cues, Verbal cues, and Handouts Education comprehension: verbalized understanding and returned demonstration   HOME EXERCISE PROGRAM: Access Code: [email or web address]  ASSESSMENT:  CLINICAL IMPRESSION: Patient reports she had improvement in pain overall since last visit, has tried short walks but does have increased pain afterward but has not been able to even attempt prior to last week, lower pain levels between appointments and pt reports she had 5/10 (last session highest pain 7/10) as highest pain since last appointment. 3-5/10 today.  pt session focused on manual work externally at coccyx and sacrum. Pt tolerated well and tolerated more mobility and gentle tissue release today without increase in pain. Pt would benefit from additional PT to further address  deficits.     OBJECTIVE IMPAIRMENTS decreased activity tolerance, decreased balance, decreased coordination, decreased endurance, decreased mobility, difficulty walking, decreased strength, increased fascial restrictions, increased  muscle spasms, impaired flexibility, improper body mechanics, postural dysfunction, and pain.   ACTIVITY LIMITATIONS carrying, lifting, standing, stairs, and locomotion level  PARTICIPATION LIMITATIONS: meal prep, cleaning, laundry, community activity, and yard work  PERSONAL FACTORS Age, Past/current experiences, Time since onset of injury/illness/exacerbation, and 1 comorbidity: medical history  are also affecting patient's functional outcome.   REHAB POTENTIAL: Good  CLINICAL DECISION MAKING: Evolving/moderate complexity  EVALUATION COMPLEXITY: Moderate   GOALS: Goals reviewed with patient? Yes  SHORT TERM GOALS: Target date: 03/25/2022  Pt to be I with HEP.  Baseline: Goal status: MET  2.  Pt will report 25% reduction of pain due to improvements in posture, strength, and muscle length  Baseline:  Goal status: on going  3.  Pt to be able to perform 10 STS with minimal use of bil UE without LOB or sway due to improved strength and tolerance to activity. Baseline:  Goal status: MET  4.  Pt to demonstrate improved gait mechanics with midline upright posture, increased step height and improved cadence for at least 75' to decrease fall risk and improve safety with ambulating to/from bathroom.  Baseline:  Goal status: MET    LONG TERM GOALS: Target date:  05/28/22    Pt to be I with advanced HEP.  Baseline:  Goal status: MET  2.  Pt will report 50% reduction of pain due to improvements in posture, strength, and muscle length  Baseline:  Goal status: MET  3.  Pt to demonstrate improved gait mechanics with midline upright posture, increased step height and improved cadence for at least 200' to decrease fall risk and improve safety with ambulating to/from bathroom.  Baseline:  Goal status: on going  4.  Pt to demonstrate at least 5/5 bil hip strength for improved pelvic stability and functional squats without leakage.  Baseline:  Goal status: on going  5.  Pt to  demonstrate at least 4/5 pelvic floor strength with ability to fully relax between contractions for improved pelvic stability  and decreased strain at pelvic floor Baseline:  Goal status: on going  6.  Pt to be I with bowel and bladder voiding techniques to decrease need to strain and feel fully empty. Baseline:  Goal status: on going  PLAN: PT FREQUENCY: 1x/week  PT DURATION:  8 sessions  PLANNED INTERVENTIONS: Therapeutic exercises, Therapeutic activity, Neuromuscular re-education, Balance training, Gait training, Patient/Family education, Self Care, Joint mobilization, Aquatic Therapy, Dry Needling, Spinal mobilization, Cryotherapy, Moist heat, Manual lymph drainage, scar mobilization, Taping, Biofeedback, and Manual therapy  PLAN FOR NEXT SESSION: internal if needed and pt consents, breathing mechanics, voiding mechanics as needed, abdominal/hip/spine stretching and strengthening  Stacy Gardner, PT, DPT 10/03/232:03 PM

## 2022-05-13 NOTE — Patient Instructions (Signed)
Moisturizers They are used in the vagina to hydrate the mucous membrane that make up the vaginal canal. Designed to keep a more normal acid balance (ph) Once placed in the vagina, it will last between two to three days.  Use 2-3 times per week at bedtime  Ingredients to avoid is glycerin and fragrance, can increase chance of infection Should not be used just before sex due to causing irritation Most are gels administered either in a tampon-shaped applicator or as a vaginal suppository. They are non-hormonal.   Types of Moisturizers(internal use)  Vitamin E vaginal suppositories- Whole foods, Amazon Moist Again Coconut oil- can break down condoms Julva- (Do no use if on Tamoxifen) amazon Yes moisturizer- amazon NeuEve Silk , NeuEve Silver for menopausal or over 65 (if have severe vaginal atrophy or cancer treatments use NeuEve Silk for  1 month than move to [employer])- [employer], [place].com Olive and Bee intimate cream- www.oliveandbee.com.au Mae vaginal moisturizer- Amazon Aloe Good Clean Love Hyaluronic acid Hyalofemme replens   Creams to use externally on the Vulva area Alber[employer]on's (good for for cancer patients that had radiation to the area)- [place] or [employer].FlyingBasics.com.br V-magic cream - amazon Julva-amazon Vital "V Wild Yam salve ( help moisturize and help with thinning vulvar area, does have [place] by Irwin Brakeman labial moisturizer (Amazon,  Coconut or olive oil aloe Good Clean Love Enchanted Rose by intimate rose  Things to avoid in the vaginal area Do not use things to irritate the vulvar area No lotions just specialized creams for the vulva area- Neogyn, V-magic, No soaps; can use Aveeno or Calendula cleanser if needed. Must be gentle No deodorants No douches Good to sleep without underwear to let the vaginal area to air out No scrubbing: spread the lips to let warm water rinse  over labias and pat dry    Lubrication Used for intercourse to reduce friction Avoid ones that have glycerin, nonoxynol-9, petroleum, propylene glycol, chlorhexidine gluconate, warming gels, tingling gels, icing or cooling gel, scented Avoid parabens due to a preservative similar to female sex hormone May need to be reapplied once or several times during sexual activity Can be applied to both partners genitals prior to vaginal penetration to minimize friction or irritation Prevent irritation and mucosal tears that cause post coital pain and increased the risk of vaginal and urinary tract infections Oil-based lubricants cannot be used with condoms due to breaking them down.  Least likely to irritate vaginal tissue.  Plant based-lubes are safe Silicone-based lubrication are thicker and last long and used for post-menopausal women  Vaginal Lubricators Here is a list of some suggested lubricators you can use for intercourse. Use the most hypoallergenic product.  You can place on you or your partner.  Slippery Stuff ( water based) Sylk or Sliquid Natural H2O ( good  if frequent UTI's)- walmart, amazon Sliquid organics silk-(aloe and silicone based ) [employer] (www.blossom-organics.com)- (aloe based ) Coconut oil, olive oil -not good with condoms  PJur Woman Nude- (water based) amazon Uberlube- ( silicon) [place] has an organic one Yes lubricant- (water based and has plant oil based similar to silicone) [employer] Platinum-Silicone, Target, [facility] Olive and Bee intimate cream-  www.oliveandbee.com.au Pink - [employer] Erosense Sync- walmart, amazon Coconu- coconu.com [place] [employer] to avoid in lubricants are glycerin, warming gels, tingling gels, icing or cooling  gels, and scented gels.  Also avoid Vaseline. [place]  jelly,  and Astroglide contain chlorhexidine which kills good bacteria(lactobacilli)  Things to avoid in the vaginal area Do not use  things to irritate the vulvar area No lotions- see below Soaps you  can use :Aveeno, Calendula, Good Clean Love cleanser if needed. Must be gentle No deodorants No douches Good to sleep without underwear to let the vaginal area to air out No scrubbing: spread the lips to let warm water rinse over labias and pat dry  Creams that can be used on the Vulva Area V [employer], walmart Vital V Wild Yam Salve Julva- [employer] Botanical Pro-Meno Wild Yam Cream Coconut oil, olive oil Cleo by [employer] labial moisturizer -[place],  [place] Releveum ( lidocaine) or [place] [employer] Yes Moisturizer

## 2022-05-20 ENCOUNTER — Ambulatory Visit: Payer: PPO | Admitting: Physical Therapy

## 2022-05-20 DIAGNOSIS — R279 Unspecified lack of coordination: Secondary | ICD-10-CM

## 2022-05-20 DIAGNOSIS — M6281 Muscle weakness (generalized): Secondary | ICD-10-CM

## 2022-05-20 DIAGNOSIS — M62838 Other muscle spasm: Secondary | ICD-10-CM

## 2022-05-20 NOTE — Therapy (Signed)
OUTPATIENT PHYSICAL THERAPY FEMALE PELVIC TREATMENT   Patient Name: Ashley Neal MRN: [id number] DOB:18-May-1946, 76 y.o., female Today's Date: 05/20/2022   PT End of Session - 05/20/22 1234     Visit Number 12    Date for PT Re-Evaluation 05/28/22    Authorization Type healthstream    PT Start Time 1232    PT Stop Time 1315    PT Time Calculation (min) 43 min    Activity Tolerance Patient limited by pain    Behavior During Therapy [facility] for tasks assessed/performed              Past Medical History:  Diagnosis Date   Anxiety    Depression    GERD (gastroesophageal reflux disease)    Hypertension    Neuromuscular disorder ([place])    Osteoarthritis    Osteopenia    Parkinson disease ([place])    Pneumonia    Tremors of nervous system    legs   uterine ca 10/08/2021   also endometrial cancer   Past Surgical History:  Procedure Laterality Date   CARPAL TUNNEL RELEASE Bilateral 1996/1997   CYSTOSCOPY N/A 10/08/2021   Procedure: CYSTOSCOPY;  Surgeon: Lafonda Mosses, MD;  Location: [facility];  Service: Gynecology;  Laterality: N/A;   ROBOTIC ASSISTED TOTAL HYSTERECTOMY WITH BILATERAL SALPINGO OOPHERECTOMY N/A 10/08/2021   Procedure: XI ROBOTIC ASSISTED TOTAL HYSTERECTOMY WITH BILATERAL SALPINGO OOPHORECTOMY;SENTINEL LYMPH NODE INJECTION;  Surgeon: Lafonda Mosses, MD;  Location: [facility];  Service: Gynecology;  Laterality: N/A;   SQUAMOUS CELL CARCINOMA EXCISION Left 2017   Left jawline   TUBAL LIGATION  1980's   Patient Active Problem List   Diagnosis Date Noted   Endometrial cancer ([place]) 10/16/2021   Complex atypical endometrial hyperplasia 09/27/2021   Primary osteoarthritis of both hands 05/14/2020   Primary osteoarthritis of both feet 05/14/2020   Age-related osteoporosis without current pathological fracture 05/14/2020   Prediabetes 05/14/2020   Essential hypertension 05/14/2020   History of hyperlipidemia 05/14/2020   History of squamous cell carcinoma  05/14/2020   Parkinsonism 05/14/2020    PCP: not listed in chart  REFERRING PROVIDER: Faustino Congress, NP  REFERRING DIAG: M53.3 (ICD-10-CM) - Sacrococcygeal disorders, not elsewhere classified R32 (ICD-10-CM) - Unspecified urinary incontinence  THERAPY DIAG:  Muscle weakness (generalized)  Other muscle spasm  Unspecified lack of coordination  Rationale for Evaluation and Treatment Rehabilitation  ONSET DATE: 1 year ago  SUBJECTIVE:  SUBJECTIVE STATEMENT: Pt reports her pain 6/10 at highest for short time then decreases with pain medications and on average 3-4/10 always at tailbone. Now able to walk at least one loop around her street.   Fluid intake: "I drink a lot of water all day"   PAIN:  Are you having pain? Yes NPRS scale:3/10 right now  Pain location:  posterior at sacrum and coccyx and low back  Pain type: burning and sharp Pain description: constant   Aggravating factors: sitting, any pressure at low back Relieving factors: resting at sidelying or prone  PRECAUTIONS: Fall  WEIGHT BEARING RESTRICTIONS No  FALLS:  Has patient fallen in last 6 months? Yes. Number of falls 1  LIVING ENVIRONMENT: Lives with: lives alone Lives in: House/apartment Stairs: Yes: External: 1 steps; on left going up Has following equipment at home: None  OCCUPATION: retired  PLOF: Independent  PATIENT GOALS to have less pain  PERTINENT HISTORY:  ROBOTIC ASSISTED PELVIC LYMPH NODE DISSECTION, PARA-AORTIC LYMPHADENECTOMY, CYSTOSCOPY 11/06/21 due to endometrial CA;robotic assisted total hysterectomy and bilateral salpingo-oophorectomy on 10/08/21; parkinsons, hypertension, hyperlipidemia, history of colitis, osteoarthritis, osteoporosis, reflux disease, anxiety, overweight  currently in  radiation, allergic to tape MRI findinds 08/31/21: incidental finding of CA, osteoarthritis of bilateral hips.large high-grade partial-thickness tear of the left hamstring origin.  Partial-thickness tear of the left gluteus medius tendon insertion.  Mild osteoarthritis of bilateral SI joints also noted. Sexual abuse: No  BOWEL MOVEMENT Pain with bowel movement: No Type of bowel movement:Type (Bristol Stool Scale) 2-5, Frequency every third day, and Strain Yes Fully empty rectum: No Leakage: No Pads: No Fiber supplement: Yes: miralax, ducalax  URINATION Pain with urination: No Fully empty bladder: No has had recent UTIs that limits this Stream: Strong Urgency: Yes:   Frequency: every 2 hours Leakage:  none Pads: No  INTERCOURSE Pain with intercourse:  not painful Ability to have vaginal penetration:  Yes:   Climax: not painful  Marinoff Scale: 0/3  PREGNANCY Vaginal deliveries 2 Tearing No C-section deliveries 2 Currently pregnant No  PROLAPSE None    OBJECTIVE:   DIAGNOSTIC FINDINGS:    COGNITION:  Overall cognitive status: Within functional limits for tasks assessed     SENSATION:  Light touch: Appears intact  Proprioception: Appears intact  MUSCLE LENGTH: Bil hamstrings and adductors by 50%  LUMBAR SPECIAL TESTS:  SI Compression/distraction test: Negative, FABER test: Negative, and Gaenslen's test: Negative   GAIT: Distance walked: 300' Assistive device utilized: None Level of assistance: Modified independence Comments: shuffled,                POSTURE: rounded shoulders, forward head, and posterior pelvic tilt   LUMBARAROM/PROM  A/PROM A/PROM  eval  Flexion Limited by 75%  Extension Limited by 25%  Right lateral flexion Limited by 75%  Left lateral flexion Limited by 75%  Right rotation Limited by 50%  Left rotation Limited by 50%   (Blank rows = not tested)  LOWER EXTREMITY ROM:  WFL but with pain at bil hip abduction and  extension  LOWER EXTREMITY MMT:  Bil hips 3+/5 grossly; knees 4/5 and ankles 5/5   PALPATION:   General  TTP at all quadrants of abdomen with fascial restrictions noted as well though Rt side worse than LT. TTP at L4-S1, midline sacrum, coccyx very painful with pt reporting she feels this is where her pain starts and then radiates in bil directions to mid gluteals.  External Perineal Exam pt deferred and would like to think about it for next session                              Internal Pelvic Floor pt deferred and would like to think about it for next session   Patient confirms identification and approves PT to assess internal pelvic floor and treatment No (at eval)  Yes 03/06/22   PELVIC MMT:   MMT 03/06/22   Vaginal 3/5; 8s; 5 reps  Internal Anal Sphincter   External Anal Sphincter   Puborectalis   Diastasis Recti   (Blank rows = not tested)        TONE: pt deferred and would like to think about it for next session   PROLAPSE: pt deferred and would like to think about it for next session   TODAY'S TREATMENT   05/20/2022:  Manual work: external at sacrum and coccyx in Lt sidelying with pt reporting less tension at Rt and more even at [place] today at a lower number. Gentle tissue release and soft tissue massage at this area and sacral springing completed bil to coccyx. Pt reports pain overall feels much better with palpation compared to previous session and continues to note lower pain levels each week with lowering of highest pain levels between appointments.     PATIENT EDUCATION:  Education details: VYMKAKYT Person educated: Patient Education method: Consulting civil engineer, Demonstration, Tactile cues, Verbal cues, and Handouts Education comprehension: verbalized understanding and returned demonstration   HOME EXERCISE PROGRAM: Access Code: [email or web address]  ASSESSMENT:  CLINICAL IMPRESSION: Patient reports she had improvement in pain overall since last visit,  has tried short walks but does have increased pain afterward but has not been able to even attempt prior to last week, lower pain levels between appointments and pt reports she had 6/10 as highest pain since last appointment. 3/10 today and ended session 1-2/10 per pt.  pt session focused on manual work externally at coccyx and sacrum. Pt tolerated well and tolerated more mobility and gentle tissue release today without increase in pain. Pt would benefit from additional PT to further address deficits.     OBJECTIVE IMPAIRMENTS decreased activity tolerance, decreased balance, decreased coordination, decreased endurance, decreased mobility, difficulty walking, decreased strength, increased fascial restrictions, increased muscle spasms, impaired flexibility, improper body mechanics, postural dysfunction, and pain.   ACTIVITY LIMITATIONS carrying, lifting, standing, stairs, and locomotion level  PARTICIPATION LIMITATIONS: meal prep, cleaning, laundry, community activity, and yard work  PERSONAL FACTORS Age, Past/current experiences, Time since onset of injury/illness/exacerbation, and 1 comorbidity: medical history  are also affecting patient's functional outcome.   REHAB POTENTIAL: Good  CLINICAL DECISION MAKING: Evolving/moderate complexity  EVALUATION COMPLEXITY: Moderate   GOALS: Goals reviewed with patient? Yes  SHORT TERM GOALS: Target date: 03/25/2022  Pt to be I with HEP.  Baseline: Goal status: MET  2.  Pt will report 25% reduction of pain due to improvements in posture, strength, and muscle length  Baseline:  Goal status: on going  3.  Pt to be able to perform 10 STS with minimal use of bil UE without LOB or sway due to improved strength and tolerance to activity. Baseline:  Goal status: MET  4.  Pt to demonstrate improved gait mechanics with midline upright posture, increased step height and improved cadence for at least 75' to decrease fall risk and improve safety with  ambulating to/from bathroom.  Baseline:  Goal  status: MET    LONG TERM GOALS: Target date:  05/28/22    Pt to be I with advanced HEP.  Baseline:  Goal status: MET  2.  Pt will report 50% reduction of pain due to improvements in posture, strength, and muscle length  Baseline:  Goal status: MET  3.  Pt to demonstrate improved gait mechanics with midline upright posture, increased step height and improved cadence for at least 200' to decrease fall risk and improve safety with ambulating to/from bathroom.  Baseline:  Goal status: on going  4.  Pt to demonstrate at least 5/5 bil hip strength for improved pelvic stability and functional squats without leakage.  Baseline:  Goal status: on going  5.  Pt to demonstrate at least 4/5 pelvic floor strength with ability to fully relax between contractions for improved pelvic stability  and decreased strain at pelvic floor Baseline:  Goal status: on going  6.  Pt to be I with bowel and bladder voiding techniques to decrease need to strain and feel fully empty. Baseline:  Goal status: on going  PLAN: PT FREQUENCY: 1x/week  PT DURATION:  8 sessions  PLANNED INTERVENTIONS: Therapeutic exercises, Therapeutic activity, Neuromuscular re-education, Balance training, Gait training, Patient/Family education, Self Care, Joint mobilization, Aquatic Therapy, Dry Needling, Spinal mobilization, Cryotherapy, Moist heat, Manual lymph drainage, scar mobilization, Taping, Biofeedback, and Manual therapy  PLAN FOR NEXT SESSION: internal if needed and pt consents, breathing mechanics, voiding mechanics as needed, abdominal/hip/spine stretching and strengthening  Stacy Gardner, PT, DPT 10/10/231:20 PM

## 2022-05-21 DIAGNOSIS — M5459 Other low back pain: Secondary | ICD-10-CM | POA: Diagnosis not present

## 2022-05-21 DIAGNOSIS — M533 Sacrococcygeal disorders, not elsewhere classified: Secondary | ICD-10-CM | POA: Diagnosis not present

## 2022-05-26 DIAGNOSIS — L821 Other seborrheic keratosis: Secondary | ICD-10-CM | POA: Diagnosis not present

## 2022-05-26 DIAGNOSIS — D225 Melanocytic nevi of trunk: Secondary | ICD-10-CM | POA: Diagnosis not present

## 2022-05-26 DIAGNOSIS — Z85828 Personal history of other malignant neoplasm of skin: Secondary | ICD-10-CM | POA: Diagnosis not present

## 2022-05-26 DIAGNOSIS — L720 Epidermal cyst: Secondary | ICD-10-CM | POA: Diagnosis not present

## 2022-05-26 DIAGNOSIS — L218 Other seborrheic dermatitis: Secondary | ICD-10-CM | POA: Diagnosis not present

## 2022-05-27 ENCOUNTER — Ambulatory Visit: Payer: PPO | Admitting: Physical Therapy

## 2022-05-27 DIAGNOSIS — M62838 Other muscle spasm: Secondary | ICD-10-CM

## 2022-05-27 DIAGNOSIS — R279 Unspecified lack of coordination: Secondary | ICD-10-CM

## 2022-05-27 DIAGNOSIS — M6281 Muscle weakness (generalized): Secondary | ICD-10-CM | POA: Diagnosis not present

## 2022-05-27 DIAGNOSIS — R293 Abnormal posture: Secondary | ICD-10-CM

## 2022-05-27 DIAGNOSIS — R269 Unspecified abnormalities of gait and mobility: Secondary | ICD-10-CM

## 2022-05-27 NOTE — Addendum Note (Signed)
Addended by: Junie Panning on: 05/27/2022 02:20 PM   Modules accepted: Orders

## 2022-05-27 NOTE — Therapy (Addendum)
OUTPATIENT PHYSICAL THERAPY FEMALE PELVIC TREATMENT   Patient Name: Ashley Neal MRN: [id number] DOB:30-Jun-1946, 76 y.o., female Today's Date: 05/27/2022   PT End of Session - 05/27/22 1315     Visit Number 13    Date for PT Re-Evaluation 07/27/22    Authorization Type healthstream    PT Start Time 1237    PT Stop Time 1315    PT Time Calculation (min) 38 min    Activity Tolerance Patient limited by pain    Behavior During Therapy [facility] for tasks assessed/performed               Past Medical History:  Diagnosis Date   Anxiety    Depression    GERD (gastroesophageal reflux disease)    Hypertension    Neuromuscular disorder ([place])    Osteoarthritis    Osteopenia    Parkinson disease ([place])    Pneumonia    Tremors of nervous system    legs   uterine ca 10/08/2021   also endometrial cancer   Past Surgical History:  Procedure Laterality Date   CARPAL TUNNEL RELEASE Bilateral 1996/1997   CYSTOSCOPY N/A 10/08/2021   Procedure: CYSTOSCOPY;  Surgeon: Lafonda Mosses, MD;  Location: [facility];  Service: Gynecology;  Laterality: N/A;   ROBOTIC ASSISTED TOTAL HYSTERECTOMY WITH BILATERAL SALPINGO OOPHERECTOMY N/A 10/08/2021   Procedure: XI ROBOTIC ASSISTED TOTAL HYSTERECTOMY WITH BILATERAL SALPINGO OOPHORECTOMY;SENTINEL LYMPH NODE INJECTION;  Surgeon: Lafonda Mosses, MD;  Location: [facility];  Service: Gynecology;  Laterality: N/A;   SQUAMOUS CELL CARCINOMA EXCISION Left 2017   Left jawline   TUBAL LIGATION  1980's   Patient Active Problem List   Diagnosis Date Noted   Endometrial cancer ([place]) 10/16/2021   Complex atypical endometrial hyperplasia 09/27/2021   Primary osteoarthritis of both hands 05/14/2020   Primary osteoarthritis of both feet 05/14/2020   Age-related osteoporosis without current pathological fracture 05/14/2020   Prediabetes 05/14/2020   Essential hypertension 05/14/2020   History of hyperlipidemia 05/14/2020   History of squamous cell carcinoma  05/14/2020   Parkinsonism 05/14/2020    PCP: not listed in chart  REFERRING PROVIDER: Faustino Congress, NP  REFERRING DIAG: M53.3 (ICD-10-CM) - Sacrococcygeal disorders, not elsewhere classified R32 (ICD-10-CM) - Unspecified urinary incontinence  THERAPY DIAG:  Muscle weakness (generalized)  Other muscle spasm  Abnormality of gait and mobility  Abnormal posture  Unspecified lack of coordination  Rationale for Evaluation and Treatment Rehabilitation  ONSET DATE: 1 year ago  SUBJECTIVE:  SUBJECTIVE STATEMENT: Pt reports she has been feeling better, highest pain 4/10 this past week and 3/10 at session today. Pt has also been walking more and pleased with this.   Fluid intake: "I drink a lot of water all day"   PAIN:  Are you having pain? Yes NPRS scale:3/10 right now  Pain location:  posterior at sacrum and coccyx and low back  Pain type: burning and sharp Pain description: constant   Aggravating factors: sitting, any pressure at low back Relieving factors: resting at sidelying or prone  PRECAUTIONS: Fall  WEIGHT BEARING RESTRICTIONS No  FALLS:  Has patient fallen in last 6 months? Yes. Number of falls 1  LIVING ENVIRONMENT: Lives with: lives alone Lives in: House/apartment Stairs: Yes: External: 1 steps; on left going up Has following equipment at home: None  OCCUPATION: retired  PLOF: Independent  PATIENT GOALS to have less pain  PERTINENT HISTORY:  ROBOTIC ASSISTED PELVIC LYMPH NODE DISSECTION, PARA-AORTIC LYMPHADENECTOMY, CYSTOSCOPY 11/06/21 due to endometrial CA;robotic assisted total hysterectomy and bilateral salpingo-oophorectomy on 10/08/21; parkinsons, hypertension, hyperlipidemia, history of colitis, osteoarthritis, osteoporosis, reflux disease, anxiety,  overweight  currently in radiation, allergic to tape MRI findinds 08/31/21: incidental finding of CA, osteoarthritis of bilateral hips.large high-grade partial-thickness tear of the left hamstring origin.  Partial-thickness tear of the left gluteus medius tendon insertion.  Mild osteoarthritis of bilateral SI joints also noted. Sexual abuse: No  BOWEL MOVEMENT Pain with bowel movement: No Type of bowel movement:Type (Bristol Stool Scale) 2-5, Frequency every third day, and Strain Yes Fully empty rectum: No Leakage: No Pads: No Fiber supplement: Yes: miralax, ducalax  URINATION Pain with urination: No Fully empty bladder: No has had recent UTIs that limits this Stream: Strong Urgency: Yes:   Frequency: every 2 hours Leakage:  none Pads: No  INTERCOURSE Pain with intercourse:  not painful Ability to have vaginal penetration:  Yes:   Climax: not painful  Marinoff Scale: 0/3  PREGNANCY Vaginal deliveries 2 Tearing No C-section deliveries 2 Currently pregnant No  PROLAPSE None    OBJECTIVE:   DIAGNOSTIC FINDINGS:    COGNITION:  Overall cognitive status: Within functional limits for tasks assessed     SENSATION:  Light touch: Appears intact  Proprioception: Appears intact  MUSCLE LENGTH: Bil hamstrings and adductors by 50%  LUMBAR SPECIAL TESTS:  SI Compression/distraction test: Negative, FABER test: Negative, and Gaenslen's test: Negative   GAIT: Distance walked: 300' Assistive device utilized: None Level of assistance: Modified independence Comments: shuffled,                POSTURE: rounded shoulders, forward head, and posterior pelvic tilt   LUMBARAROM/PROM  A/PROM A/PROM  eval  Flexion Limited by 75%  Extension Limited by 25%  Right lateral flexion Limited by 75%  Left lateral flexion Limited by 75%  Right rotation Limited by 50%  Left rotation Limited by 50%   (Blank rows = not tested)  LOWER EXTREMITY ROM:  WFL but with pain at bil hip  abduction and extension  LOWER EXTREMITY MMT:  Bil hips 3+/5 grossly; knees 4/5 and ankles 5/5   PALPATION:   General  TTP at all quadrants of abdomen with fascial restrictions noted as well though Rt side worse than LT. TTP at L4-S1, midline sacrum, coccyx very painful with pt reporting she feels this is where her pain starts and then radiates in bil directions to mid gluteals.  External Perineal Exam pt deferred and would like to think about it for next session                              Internal Pelvic Floor pt deferred and would like to think about it for next session   Patient confirms identification and approves PT to assess internal pelvic floor and treatment No (at eval)  Yes 03/06/22   PELVIC MMT:   MMT 03/06/22   Vaginal 3/5; 8s; 5 reps  Internal Anal Sphincter   External Anal Sphincter   Puborectalis   Diastasis Recti   (Blank rows = not tested)        TONE: pt deferred and would like to think about it for next session   PROLAPSE: pt deferred and would like to think about it for next session   TODAY'S TREATMENT   05/27/22  Manual work: external at sacrum and coccyx in Lt sidelying with pt reporting less tension overall but still TTP at end of coccyx. Addaday used at bil gluteals, sacrum, and at coccyx. Pt reports this helped a lot and felt much looser. Then,  Gentle tissue release and soft tissue massage at this area completed bil to coccyx. Pt continues to endorse lower pain levels overall and reports she has started to feel better for longer periods of time post PT sessions now.  PATIENT EDUCATION:  Education details: VYMKAKYT Person educated: Patient Education method: Consulting civil engineer, Demonstration, Tactile cues, Verbal cues, and Handouts Education comprehension: verbalized understanding and returned demonstration   HOME EXERCISE PROGRAM: Access Code: [email or web address]  ASSESSMENT:  CLINICAL IMPRESSION: Patient pt reports her pain has improved  overall, now highest in the last week was 4/10, today 3/10 at start of session and end of session 2/10. Pt pleased with progress overall. Pt states she is scheduled to have an epidural on Tuesday and unsure how much this will effect her remaining pain but wanted to try it. Pt continues to report pain with increased activity but tolerance is improving outside of PT as pt states she is trying to walk more now as pain previously limited all mobility even inside home, unable to walk outside at all. Pt has shown progress with reported improvement with pain levels consistently though slow to progress with comorbidities of parkinson's disease, chronic pain, radiation status post CA, osteoarthritis of bilateral SI joints and bil hips. Due to progress but pain still remaining, pt would benefit from additional PT sessions for complete meeting of all goals and progress strength of hips and core for improved pelvic stability.    OBJECTIVE IMPAIRMENTS decreased activity tolerance, decreased balance, decreased coordination, decreased endurance, decreased mobility, difficulty walking, decreased strength, increased fascial restrictions, increased muscle spasms, impaired flexibility, improper body mechanics, postural dysfunction, and pain.   ACTIVITY LIMITATIONS carrying, lifting, standing, stairs, and locomotion level  PARTICIPATION LIMITATIONS: meal prep, cleaning, laundry, community activity, and yard work  PERSONAL FACTORS Age, Past/current experiences, Time since onset of injury/illness/exacerbation, and 1 comorbidity: medical history  are also affecting patient's functional outcome.   REHAB POTENTIAL: Good  CLINICAL DECISION MAKING: Evolving/moderate complexity  EVALUATION COMPLEXITY: Moderate   GOALS: Goals reviewed with patient? Yes  SHORT TERM GOALS: Target date: 03/25/2022  Pt to be I with HEP.  Baseline: Goal status: MET  2.  Pt will report 25% reduction of pain due to improvements in posture,  strength, and muscle length  Baseline:  Goal status: MET  3.  Pt to be able to perform 10 STS with minimal use of bil UE without LOB or sway due to improved strength and tolerance to activity. Baseline:  Goal status: MET  4.  Pt to demonstrate improved gait mechanics with midline upright posture, increased step height and improved cadence for at least 75' to decrease fall risk and improve safety with ambulating to/from bathroom.  Baseline:  Goal status: MET    LONG TERM GOALS: Target date:  05/28/22    Pt to be I with advanced HEP.  Baseline:  Goal status: MET  2.  Pt will report 50% reduction of pain due to improvements in posture, strength, and muscle length  Baseline:  Goal status: MET  3.  Pt to demonstrate improved gait mechanics with midline upright posture, increased step height and improved cadence for at least 200' to decrease fall risk and improve safety with ambulating to/from bathroom.  Baseline:  Goal status: on going  4.  Pt to demonstrate at least 5/5 bil hip strength for improved pelvic stability and functional squats without leakage.  Baseline:  Goal status: on going  5.  Pt to demonstrate at least 4/5 pelvic floor strength with ability to fully relax between contractions for improved pelvic stability  and decreased strain at pelvic floor Baseline:  Goal status: on going  6.  Pt to be I with bowel and bladder voiding techniques to decrease need to strain and feel fully empty. Baseline:  Goal status: MET  PLAN: PT FREQUENCY: 1x/week  PT DURATION:  4 sessions  PLANNED INTERVENTIONS: Therapeutic exercises, Therapeutic activity, Neuromuscular re-education, Balance training, Gait training, Patient/Family education, Self Care, Joint mobilization, Aquatic Therapy, Dry Needling, Spinal mobilization, Cryotherapy, Moist heat, Manual lymph drainage, scar mobilization, Taping, Biofeedback, and Manual therapy  PLAN FOR NEXT SESSION: internal if needed and pt  consents, breathing mechanics, voiding mechanics as needed, abdominal/hip/spine stretching and strengthening  Stacy Gardner, PT, DPT 10/17/232:19 PM

## 2022-05-28 ENCOUNTER — Other Ambulatory Visit: Payer: Self-pay | Admitting: Family Medicine

## 2022-05-28 DIAGNOSIS — Z1231 Encounter for screening mammogram for malignant neoplasm of breast: Secondary | ICD-10-CM

## 2022-05-29 DIAGNOSIS — F4323 Adjustment disorder with mixed anxiety and depressed mood: Secondary | ICD-10-CM | POA: Diagnosis not present

## 2022-05-29 DIAGNOSIS — Z510 Encounter for antineoplastic radiation therapy: Secondary | ICD-10-CM | POA: Diagnosis not present

## 2022-05-29 DIAGNOSIS — C541 Malignant neoplasm of endometrium: Secondary | ICD-10-CM | POA: Diagnosis not present

## 2022-05-30 DIAGNOSIS — C541 Malignant neoplasm of endometrium: Secondary | ICD-10-CM | POA: Diagnosis not present

## 2022-05-30 DIAGNOSIS — Z510 Encounter for antineoplastic radiation therapy: Secondary | ICD-10-CM | POA: Diagnosis not present

## 2022-06-02 ENCOUNTER — Telehealth: Payer: Self-pay | Admitting: Adult Health

## 2022-06-02 DIAGNOSIS — C541 Malignant neoplasm of endometrium: Secondary | ICD-10-CM | POA: Diagnosis not present

## 2022-06-02 DIAGNOSIS — R5383 Other fatigue: Secondary | ICD-10-CM | POA: Diagnosis not present

## 2022-06-02 DIAGNOSIS — R42 Dizziness and giddiness: Secondary | ICD-10-CM | POA: Diagnosis not present

## 2022-06-02 DIAGNOSIS — Z510 Encounter for antineoplastic radiation therapy: Secondary | ICD-10-CM | POA: Diagnosis not present

## 2022-06-02 DIAGNOSIS — Z6825 Body mass index (BMI) 25.0-25.9, adult: Secondary | ICD-10-CM | POA: Diagnosis not present

## 2022-06-02 NOTE — Telephone Encounter (Signed)
Noted, thank you

## 2022-06-02 NOTE — Telephone Encounter (Signed)
Pt is calling. Stating her Parkinson is worsen stayed she is very dizzy and requesting a sooner appointment. Pt is requesting a call back from nurse.

## 2022-06-02 NOTE — Telephone Encounter (Signed)
I called pt.  She is having an issue with increased tremors in legs and hands.  Feels that the PD is causing dizziness. PD is intolerable. She is taking sinemet alternating 25/100 tabs starting with one tablet at 0700 every 4 hours. She is using compression stockings.   Pt is not on Bp med. Is having a lot of back pain and getting injections.  I offered her an appt tomorrow with Dr. Rexene Alberts, but she is having injection so cannot take.  I placed on waitlist (next app is 06-23-2022 with MM/NP).  She did see her pcp this am. Bp 106/68.

## 2022-06-03 ENCOUNTER — Ambulatory Visit: Payer: PPO | Admitting: Neurology

## 2022-06-03 DIAGNOSIS — C541 Malignant neoplasm of endometrium: Secondary | ICD-10-CM | POA: Diagnosis not present

## 2022-06-03 DIAGNOSIS — Z510 Encounter for antineoplastic radiation therapy: Secondary | ICD-10-CM | POA: Diagnosis not present

## 2022-06-03 DIAGNOSIS — M5416 Radiculopathy, lumbar region: Secondary | ICD-10-CM | POA: Diagnosis not present

## 2022-06-03 NOTE — Telephone Encounter (Signed)
I consulted with Dr. Rexene Alberts.  She would not change her medications at this time.  Will see her at appt or sooner if availability.

## 2022-06-04 DIAGNOSIS — Z510 Encounter for antineoplastic radiation therapy: Secondary | ICD-10-CM | POA: Diagnosis not present

## 2022-06-04 DIAGNOSIS — C541 Malignant neoplasm of endometrium: Secondary | ICD-10-CM | POA: Diagnosis not present

## 2022-06-05 ENCOUNTER — Ambulatory Visit: Payer: PPO | Admitting: Adult Health

## 2022-06-05 DIAGNOSIS — Z510 Encounter for antineoplastic radiation therapy: Secondary | ICD-10-CM | POA: Diagnosis not present

## 2022-06-05 DIAGNOSIS — C541 Malignant neoplasm of endometrium: Secondary | ICD-10-CM | POA: Diagnosis not present

## 2022-06-06 DIAGNOSIS — Z510 Encounter for antineoplastic radiation therapy: Secondary | ICD-10-CM | POA: Diagnosis not present

## 2022-06-06 DIAGNOSIS — C541 Malignant neoplasm of endometrium: Secondary | ICD-10-CM | POA: Diagnosis not present

## 2022-06-09 DIAGNOSIS — C541 Malignant neoplasm of endometrium: Secondary | ICD-10-CM | POA: Diagnosis not present

## 2022-06-09 DIAGNOSIS — Z510 Encounter for antineoplastic radiation therapy: Secondary | ICD-10-CM | POA: Diagnosis not present

## 2022-06-10 DIAGNOSIS — Z510 Encounter for antineoplastic radiation therapy: Secondary | ICD-10-CM | POA: Diagnosis not present

## 2022-06-10 DIAGNOSIS — C541 Malignant neoplasm of endometrium: Secondary | ICD-10-CM | POA: Diagnosis not present

## 2022-06-10 NOTE — Progress Notes (Unsigned)
PATIENT: Ashley Neal DOB: September 24, 1945  REASON FOR VISIT: follow up HISTORY FROM: patient PRIMARY NEUROLOGIST: Dr. Rexene Alberts  Chief Complaint  Patient presents with   Follow-up    Pt in 5  Pt here for Parkinsons f/u   Pt states having dizziness  Ptr states she did fall and blacked out for a second .  Pt states no ED visit Pt states legs are weak  Pt states was diagnosed with uterine cancer . Pt had hysterectomy in feb 2023      HISTORY OF PRESENT ILLNESS: Today 06/11/22  Ashley Neal is a 76 y.o. female who has been followed in this office for Parkinson's disease. Returns today for follow-up. Reports in Sinemet 25-100 taking 5 times a day and alternating between 1 and 1.5 tablets daily.  Reports dizziness-- has had it daily for the last 2 weeks. Briefly worse with position changes.  Reports that she drinks about 5 followers a day.  Recently started drinking propel as advised by her PCP.  No recent falls. Legs feel weak- occurring for a few months. Notices tremor in the right hand. No trouble chewing or swallowing food. Sleeping well.  Reports that she wants to get started back with the senior center.  HISTORY 03/03/2022: She reports having had difficulty with constipation, she has difficulty keeping up with the schedule especially the early morning dosing.  I reminded her that we initiated the early morning dose of levodopa because she was awake that early.  She reports that she does not wake up that early now.  She is willing to shift the schedule a little bit to take 1-1/2 pills for all 5 doses and to take the first dose at 7, and keep a 4 hourly schedule with the last dose around 11 now.  She has had increase in stress.  Her husband has stayed in [place].  She reports having had 1 fall in the last week, she fell in a parking garage, her friend was with her, she landed on her buttocks, did not have any injuries but has had back pain.  The back pain actually seem to be just slightly  better after the fall per her report, she also had a recent low back injection, has not felt a kick in yet.  She has been taking MiraLAX and Dulcolax for her constipation.  She tries to hydrate well.   She has had more anxiety symptoms, she takes Xanax sparingly per PCP.  She has been to urgent care for back pain.  She went to [facility] long ED on 01/31/2022 for back pain.  I reviewed the emergency room records.  She received Dilaudid, Zofran, Rocephin, and Decadron.  She was found to have a UTI.  She had a MRI of the pelvis with and without contrast on 01/25/2022 and I reviewed the results:IMPRESSION: 1. No acute findings or clear explanation for the patient's symptoms. 2. Although incompletely visualized, grossly stable partial tear of the left common hamstring tendon. Mild gluteus tendinosis bilaterally. 3. No evidence of sacral fracture or metastatic disease. Mild sacroiliac degenerative changes bilaterally.   The patient's allergies, current medications, family history, past medical history, past social history, past surgical history and problem list were reviewed and updated as appropriate.   REVIEW OF SYSTEMS: Out of a complete 14 system review of symptoms, the patient complains only of the following symptoms, and all other reviewed systems are negative.  ALLERGIES: Allergies  Allergen Reactions   Codeine Other (See Comments)  Nausea    Tape Other (See Comments)    Certain tapes pull skin off    HOME MEDICATIONS: Outpatient Medications Prior to Visit  Medication Sig Dispense Refill   ALPRAZolam (XANAX) 0.25 MG tablet Take 0.25 mg by mouth 2 (two) times daily as needed for anxiety.     aspirin EC 81 MG tablet Take 81 mg by mouth daily.     bisacodyl (DULCOLAX) 5 MG EC tablet Take by mouth.     carbidopa-levodopa (SINEMET IR) 25-100 MG tablet 1-1/2 pills at 4 AM, 1 pill at 7 AM, 1-1/2 pills at 11 AM, 1 pill at 3 PM, 1-1/2 pills at 7 PM daily 675 tablet 2   Multiple Vitamin  (MULTIVITAMIN) tablet Take 1 tablet by mouth daily.     polyethylene glycol powder (GLYCOLAX/MIRALAX) 17 GM/SCOOP powder Take 17 g by mouth every other day.     No facility-administered medications prior to visit.    PAST MEDICAL HISTORY: Past Medical History:  Diagnosis Date   Anxiety    Depression    GERD (gastroesophageal reflux disease)    Hypertension    Neuromuscular disorder (HCC)    Osteoarthritis    Osteopenia    Parkinson disease (HCC)    Pneumonia    Tremors of nervous system    legs   uterine ca 10/08/2021   also endometrial cancer    PAST SURGICAL HISTORY: Past Surgical History:  Procedure Laterality Date   CARPAL TUNNEL RELEASE Bilateral 1996/1997   CYSTOSCOPY N/A 10/08/2021   Procedure: CYSTOSCOPY;  Surgeon: Lafonda Mosses, MD;  Location: [facility];  Service: Gynecology;  Laterality: N/A;   ROBOTIC ASSISTED TOTAL HYSTERECTOMY WITH BILATERAL SALPINGO OOPHERECTOMY N/A 10/08/2021   Procedure: XI ROBOTIC ASSISTED TOTAL HYSTERECTOMY WITH BILATERAL SALPINGO OOPHORECTOMY;SENTINEL LYMPH NODE INJECTION;  Surgeon: Lafonda Mosses, MD;  Location: [facility];  Service: Gynecology;  Laterality: N/A;   SQUAMOUS CELL CARCINOMA EXCISION Left 2017   Left jawline   TUBAL LIGATION  1980's    FAMILY HISTORY: Family History  Problem Relation Age of Onset   Endometrial cancer Mother    Diabetes Mother    Alzheimer's disease Father    Coronary artery disease Father    Diabetes Sister    COPD Sister    Diabetes Sister    Heart disease Brother    Other Brother        MVA    Parkinson's disease Brother    Heart disease Brother    Alcohol abuse Brother    Heart disease Brother    Parkinson's disease Maternal Aunt    Breast cancer Maternal Aunt    Colon cancer Paternal Aunt    Lung cancer Paternal Uncle    Healthy Son    Healthy Son    Prostate cancer Neg Hx    Pancreatic cancer Neg Hx    Ovarian cancer Neg Hx     SOCIAL HISTORY: Social History   Socioeconomic  History   Marital status: Married    Spouse name: Not on file   Number of children: Not on file   Years of education: Not on file   Highest education level: Not on file  Occupational History   Occupation: retired    Comment: Chiropractor  Tobacco Use   Smoking status: Never   Smokeless tobacco: Never  Vaping Use   Vaping Use: Never used  Substance and Sexual Activity   Alcohol use: Yes    Comment: ocasiona;   Drug use: Not Currently  Sexual activity: Not Currently  Other Topics Concern   Not on file  Social History Narrative   Not on file   Social Determinants of Health   Financial Resource Strain: Not on file  Food Insecurity: Not on file  Transportation Needs: Not on file  Physical Activity: Not on file  Stress: Not on file  Social Connections: Not on file  Intimate Partner Violence: Not on file      PHYSICAL EXAM  Vitals:   06/11/22 0925  Weight: 136 lb 6.4 oz (61.9 kg)  Height: 5' (1.524 m)     Orthostatic VS for the past 72 hrs (Last 3 readings):  Orthostatic BP Orthostatic Pulse  06/11/22 0931 91/63 87  06/11/22 0928 119/76 81  06/11/22 0925 142/83 81     Generalized: Well developed, in no acute distress   Neurological examination  Mentation: Alert oriented to time, place, history taking. Follows all commands speech and language fluent Cranial nerve II-XII: Pupils were equal round reactive to light. Extraocular movements were full, visual field were full on confrontational test. Facial sensation and strength were normal.  Head turning and shoulder shrug  were normal and symmetric. Motor: The motor testing reveals 5 over 5 strength of all 4 extremities. Good symmetric motor tone is noted throughout.  Mild impairment with finger taps bilaterally. Sensory: Sensory testing is intact to soft touch on all 4 extremities. No evidence of extinction is noted.  Coordination: Cerebellar testing reveals good finger-nose-finger and heel-to-shin bilaterally.   Gait and station: Patient able to stand from a sitting position without assistance.  Good stride, decreased arm swing.  2 steps with turns. Reflexes: Deep tendon reflexes are symmetric and normal bilaterally.   DIAGNOSTIC DATA (LABS, IMAGING, TESTING) - I reviewed patient records, labs, notes, testing and imaging myself where available.  Lab Results  Component Value Date   WBC 4.8 01/31/2022   HGB 12.6 01/31/2022   HCT 36.7 01/31/2022   MCV 89.5 01/31/2022   PLT 181 01/31/2022      Component Value Date/Time   NA 143 01/31/2022 2031   K 4.0 01/31/2022 2031   CL 107 01/31/2022 2031   CO2 29 01/31/2022 2031   GLUCOSE 105 (H) 01/31/2022 2031   BUN 15 01/31/2022 2031   CREATININE 0.85 01/31/2022 2031   CALCIUM 10.0 01/31/2022 2031   PROT 6.7 01/31/2022 2031   ALBUMIN 4.3 01/31/2022 2031   AST 15 01/31/2022 2031   ALT 6 01/31/2022 2031   ALKPHOS 53 01/31/2022 2031   BILITOT 0.7 01/31/2022 2031   GFRNONAA >60 01/31/2022 2031   GFRAA 78 (L) 09/18/2014 1128     ASSESSMENT AND PLAN 76 y.o. year old female  has a past medical history of Anxiety, Depression, GERD (gastroesophageal reflux disease), Hypertension, Neuromuscular disorder ([place]), Osteoarthritis, Osteopenia, Parkinson disease ([place]), Pneumonia, Tremors of nervous system, and uterine ca (10/08/2021). here with:  Parkinson's diease Orthostatic hypotension  - Decrease Sinemet to 1 tablet 5 times a day d/t to orthostatic hypotension - Stay well hydrated, change positions slowly - Discussed physical therapy however she plans to try a chair yoga class at her senior center.  She may consider physical therapy in the future. -Advised if dizziness does not improve she should let us know.  Follow-up in 4 to 5 months or sooner if needed     Ward Givens, MSN, NP-C 06/10/2022, 3:33 PM [facility] [address] [place],  [address] [phone]

## 2022-06-11 ENCOUNTER — Ambulatory Visit: Payer: PPO | Admitting: Adult Health

## 2022-06-11 ENCOUNTER — Encounter: Payer: Self-pay | Admitting: Adult Health

## 2022-06-11 VITALS — Ht 60.0 in | Wt 136.4 lb

## 2022-06-11 DIAGNOSIS — G20A1 Parkinson's disease without dyskinesia, without mention of fluctuations: Secondary | ICD-10-CM

## 2022-06-11 DIAGNOSIS — I951 Orthostatic hypotension: Secondary | ICD-10-CM

## 2022-06-11 DIAGNOSIS — C541 Malignant neoplasm of endometrium: Secondary | ICD-10-CM | POA: Diagnosis not present

## 2022-06-11 DIAGNOSIS — Z510 Encounter for antineoplastic radiation therapy: Secondary | ICD-10-CM | POA: Diagnosis not present

## 2022-06-11 MED ORDER — CARBIDOPA-LEVODOPA 25-100 MG PO TABS: 1.0000 | ORAL_TABLET | Freq: Every day | ORAL | 3 refills | Status: DC

## 2022-06-11 NOTE — Patient Instructions (Addendum)
Your Plan:  Decrease  Sinemet to 1 tablet 5 times a day d/t to orthostatic hypotension stay well hydrated, change positions slowly   Thank you for coming to see [place] at [facility]. I hope we have been able to provide you high quality care today.  You may receive a patient satisfaction survey over the next few weeks. We would appreciate your feedback and comments so that we may continue to improve ourselves and the health of our patients.

## 2022-06-12 DIAGNOSIS — Z510 Encounter for antineoplastic radiation therapy: Secondary | ICD-10-CM | POA: Diagnosis not present

## 2022-06-12 DIAGNOSIS — C541 Malignant neoplasm of endometrium: Secondary | ICD-10-CM | POA: Diagnosis not present

## 2022-06-13 DIAGNOSIS — C541 Malignant neoplasm of endometrium: Secondary | ICD-10-CM | POA: Diagnosis not present

## 2022-06-13 DIAGNOSIS — Z510 Encounter for antineoplastic radiation therapy: Secondary | ICD-10-CM | POA: Diagnosis not present

## 2022-06-16 DIAGNOSIS — C541 Malignant neoplasm of endometrium: Secondary | ICD-10-CM | POA: Diagnosis not present

## 2022-06-16 DIAGNOSIS — Z510 Encounter for antineoplastic radiation therapy: Secondary | ICD-10-CM | POA: Diagnosis not present

## 2022-06-17 ENCOUNTER — Encounter: Payer: Self-pay | Admitting: Physical Therapy

## 2022-06-17 DIAGNOSIS — Z510 Encounter for antineoplastic radiation therapy: Secondary | ICD-10-CM | POA: Diagnosis not present

## 2022-06-17 DIAGNOSIS — C541 Malignant neoplasm of endometrium: Secondary | ICD-10-CM | POA: Diagnosis not present

## 2022-06-18 ENCOUNTER — Ambulatory Visit: Payer: PPO | Attending: Family Medicine | Admitting: Physical Therapy

## 2022-06-18 DIAGNOSIS — R531 Weakness: Secondary | ICD-10-CM | POA: Diagnosis not present

## 2022-06-18 DIAGNOSIS — M545 Low back pain, unspecified: Secondary | ICD-10-CM | POA: Diagnosis not present

## 2022-06-18 DIAGNOSIS — M62838 Other muscle spasm: Secondary | ICD-10-CM | POA: Insufficient documentation

## 2022-06-18 DIAGNOSIS — R269 Unspecified abnormalities of gait and mobility: Secondary | ICD-10-CM | POA: Insufficient documentation

## 2022-06-18 DIAGNOSIS — Z510 Encounter for antineoplastic radiation therapy: Secondary | ICD-10-CM | POA: Diagnosis not present

## 2022-06-18 DIAGNOSIS — M6281 Muscle weakness (generalized): Secondary | ICD-10-CM | POA: Insufficient documentation

## 2022-06-18 DIAGNOSIS — N390 Urinary tract infection, site not specified: Secondary | ICD-10-CM | POA: Diagnosis not present

## 2022-06-18 DIAGNOSIS — R0602 Shortness of breath: Secondary | ICD-10-CM | POA: Diagnosis not present

## 2022-06-18 DIAGNOSIS — G20B2 Parkinson's disease with dyskinesia, with fluctuations: Secondary | ICD-10-CM | POA: Diagnosis not present

## 2022-06-18 DIAGNOSIS — R5383 Other fatigue: Secondary | ICD-10-CM | POA: Diagnosis not present

## 2022-06-18 DIAGNOSIS — C541 Malignant neoplasm of endometrium: Secondary | ICD-10-CM | POA: Diagnosis not present

## 2022-06-18 NOTE — Therapy (Signed)
OUTPATIENT PHYSICAL THERAPY FEMALE PELVIC TREATMENT   Patient Name: Ashley Neal MRN: [id number] DOB:1945-09-11, 76 y.o., female Today's Date: 06/18/2022   PT End of Session - 06/18/22 1021     Visit Number 14    Date for PT Re-Evaluation 07/27/22    Authorization Type healthstream    PT Start Time 1016    PT Stop Time 1054    PT Time Calculation (min) 38 min    Activity Tolerance Patient tolerated treatment well    Behavior During Therapy [facility] for tasks assessed/performed               Past Medical History:  Diagnosis Date   Anxiety    Basal cell carcinoma    Depression    GERD (gastroesophageal reflux disease)    Hypertension    Neuromuscular disorder ([place])    Osteoarthritis    Osteopenia    Parkinson disease    Pneumonia    Tremors of nervous system    legs   uterine ca 10/08/2021   also endometrial cancer   Past Surgical History:  Procedure Laterality Date   CARPAL TUNNEL RELEASE Bilateral 1996/1997   CYSTOSCOPY N/A 10/08/2021   Procedure: CYSTOSCOPY;  Surgeon: Lafonda Mosses, MD;  Location: [facility];  Service: Gynecology;  Laterality: N/A;   ROBOTIC ASSISTED TOTAL HYSTERECTOMY WITH BILATERAL SALPINGO OOPHERECTOMY N/A 10/08/2021   Procedure: XI ROBOTIC ASSISTED TOTAL HYSTERECTOMY WITH BILATERAL SALPINGO OOPHORECTOMY;SENTINEL LYMPH NODE INJECTION;  Surgeon: Lafonda Mosses, MD;  Location: [facility];  Service: Gynecology;  Laterality: N/A;   SQUAMOUS CELL CARCINOMA EXCISION Left 2017   Left jawline   TUBAL LIGATION  1980's   Patient Active Problem List   Diagnosis Date Noted   Endometrial cancer ([place]) 10/16/2021   Complex atypical endometrial hyperplasia 09/27/2021   Primary osteoarthritis of both hands 05/14/2020   Primary osteoarthritis of both feet 05/14/2020   Age-related osteoporosis without current pathological fracture 05/14/2020   Prediabetes 05/14/2020   Essential hypertension 05/14/2020   History of hyperlipidemia 05/14/2020   History  of squamous cell carcinoma 05/14/2020   Parkinsonism 05/14/2020    PCP: not listed in chart  REFERRING PROVIDER: Faustino Congress, NP  REFERRING DIAG: M53.3 (ICD-10-CM) - Sacrococcygeal disorders, not elsewhere classified R32 (ICD-10-CM) - Unspecified urinary incontinence  THERAPY DIAG:  Muscle weakness (generalized)  Abnormality of gait and mobility  Other muscle spasm  Rationale for Evaluation and Treatment Rehabilitation  ONSET DATE: 1 year ago  SUBJECTIVE:  SUBJECTIVE STATEMENT: Pt reports she has had one additional shot since last visit and since then has not had a lot of pain, "not gone but so much better". Pt reports she has been able to walk more now up to one mile at a time. Pt pleased with progress of pain and thinks this has made a big difference. Pt mostly frustrated with blood pressure fluctuations recently. Pt has let Dr know about this and has follow up for this and remaining pain. Pt reports she is pleased to say she is going to go to her seated yoga class for the first time since pain started over a year  Fluid intake: "I drink a lot of water all day"   PAIN:  Are you having pain? no NPRS scale:2/10 right now  Pain location:  posterior at sacrum and coccyx and low back  Pain type: burning  Pain description: constant   Aggravating factors: sitting, any pressure at low back Relieving factors: resting at sidelying or prone  PRECAUTIONS: Fall  WEIGHT BEARING RESTRICTIONS No  FALLS:  Has patient fallen in last 6 months? Yes. Number of falls 1  LIVING ENVIRONMENT: Lives with: lives alone Lives in: House/apartment Stairs: Yes: External: 1 steps; on left going up Has following equipment at home: None  OCCUPATION: retired  PLOF: Independent  PATIENT GOALS to have  less pain  PERTINENT HISTORY:  ROBOTIC ASSISTED PELVIC LYMPH NODE DISSECTION, PARA-AORTIC LYMPHADENECTOMY, CYSTOSCOPY 11/06/21 due to endometrial CA;robotic assisted total hysterectomy and bilateral salpingo-oophorectomy on 10/08/21; parkinsons, hypertension, hyperlipidemia, history of colitis, osteoarthritis, osteoporosis, reflux disease, anxiety, overweight  currently in radiation, allergic to tape MRI findinds 08/31/21: incidental finding of CA, osteoarthritis of bilateral hips.large high-grade partial-thickness tear of the left hamstring origin.  Partial-thickness tear of the left gluteus medius tendon insertion.  Mild osteoarthritis of bilateral SI joints also noted. Sexual abuse: No  BOWEL MOVEMENT Pain with bowel movement: No Type of bowel movement:Type (Bristol Stool Scale) 2-5, Frequency every third day, and Strain Yes Fully empty rectum: No Leakage: No Pads: No Fiber supplement: Yes: miralax, ducalax  URINATION Pain with urination: No Fully empty bladder: No has had recent UTIs that limits this Stream: Strong Urgency: Yes:   Frequency: every 2 hours Leakage:  none Pads: No  INTERCOURSE Pain with intercourse:  not painful Ability to have vaginal penetration:  Yes:   Climax: not painful  Marinoff Scale: 0/3  PREGNANCY Vaginal deliveries 2 Tearing No C-section deliveries 2 Currently pregnant No  PROLAPSE None    OBJECTIVE:   DIAGNOSTIC FINDINGS:    COGNITION:  Overall cognitive status: Within functional limits for tasks assessed     SENSATION:  Light touch: Appears intact  Proprioception: Appears intact  MUSCLE LENGTH: Bil hamstrings and adductors by 50%  LUMBAR SPECIAL TESTS:  SI Compression/distraction test: Negative, FABER test: Negative, and Gaenslen's test: Negative   GAIT: Distance walked: 300' Assistive device utilized: None Level of assistance: Modified independence Comments: shuffled,                POSTURE: rounded shoulders, forward  head, and posterior pelvic tilt   LUMBARAROM/PROM  A/PROM A/PROM  eval  Flexion Limited by 75%  Extension Limited by 25%  Right lateral flexion Limited by 75%  Left lateral flexion Limited by 75%  Right rotation Limited by 50%  Left rotation Limited by 50%   (Blank rows = not tested)  LOWER EXTREMITY ROM:  WFL but with pain at bil hip abduction and extension  LOWER EXTREMITY  MMT:  Bil hips 3+/5 grossly; knees 4/5 and ankles 5/5   PALPATION:   General  TTP at all quadrants of abdomen with fascial restrictions noted as well though Rt side worse than LT. TTP at L4-S1, midline sacrum, coccyx very painful with pt reporting she feels this is where her pain starts and then radiates in bil directions to mid gluteals.                 External Perineal Exam pt deferred and would like to think about it for next session                              Internal Pelvic Floor pt deferred and would like to think about it for next session   Patient confirms identification and approves PT to assess internal pelvic floor and treatment No (at eval)  Yes 03/06/22   PELVIC MMT:   MMT 03/06/22   Vaginal 3/5; 8s; 5 reps  Internal Anal Sphincter   External Anal Sphincter   Puborectalis   Diastasis Recti   (Blank rows = not tested)        TONE: pt deferred and would like to think about it for next session   PROLAPSE: pt deferred and would like to think about it for next session   TODAY'S TREATMENT   06/18/22: Pt requested BP be taken at start of session in sitting 132/92 and in standing after 1 min 111/92 pt denied any symptoms today. Reports she sometimes will feel lightheadedness but not at this time but reports she has been trying to take it at home and knows it has been up and down and has appt with MD to follow up today.  Pt requests session today focus on DC and updating HEP to make sure she knows what to do at home. This was completed, pt did 1-2 reps of all HEP exercises to insure  technique which pt demonstrated good technique for all. Pt reports she feels much better and pleased with progress.   PATIENT EDUCATION:  Education details: VYMKAKYT Person educated: Patient Education method: Explanation, Demonstration, Tactile cues, Verbal cues, and Handouts Education comprehension: verbalized understanding and returned demonstration   HOME EXERCISE PROGRAM: Access Code: [email or web address]  ASSESSMENT:  CLINICAL IMPRESSION: Patient session focused on self care for DC recommendations of continued HEP, HEP review and updates, and pt encouraging pt to follow up with MD (she has appt today with MD to discuss blood pressure and last remaining pain). Pt reports with PT and then last shot her pain levels have improved greatly and she is pleased with progress now able to walk more and feels she is more able to go to her senior classes and do chair yoga as able. Pt will feel more confident once she has BP appt today.    OBJECTIVE IMPAIRMENTS decreased activity tolerance, decreased balance, decreased coordination, decreased endurance, decreased mobility, difficulty walking, decreased strength, increased fascial restrictions, increased muscle spasms, impaired flexibility, improper body mechanics, postural dysfunction, and pain.   ACTIVITY LIMITATIONS carrying, lifting, standing, stairs, and locomotion level  PARTICIPATION LIMITATIONS: meal prep, cleaning, laundry, community activity, and yard work  PERSONAL FACTORS Age, Past/current experiences, Time since onset of injury/illness/exacerbation, and 1 comorbidity: medical history  are also affecting patient's functional outcome.   REHAB POTENTIAL: Good  CLINICAL DECISION MAKING: Evolving/moderate complexity  EVALUATION COMPLEXITY: Moderate   GOALS: Goals reviewed with patient? Yes  SHORT TERM GOALS: Target date:  03/25/2022  Pt to be I with HEP.  Baseline: Goal status: MET  2.  Pt will report 25% reduction of pain due to  improvements in posture, strength, and muscle length  Baseline:  Goal status: MET  3.  Pt to be able to perform 10 STS with minimal use of bil UE without LOB or sway due to improved strength and tolerance to activity. Baseline:  Goal status: MET  4.  Pt to demonstrate improved gait mechanics with midline upright posture, increased step height and improved cadence for at least 75' to decrease fall risk and improve safety with ambulating to/from bathroom.  Baseline:  Goal status: MET    LONG TERM GOALS: Target date:  05/28/22    Pt to be I with advanced HEP.  Baseline:  Goal status: MET  2.  Pt will report 50% reduction of pain due to improvements in posture, strength, and muscle length  Baseline:  Goal status: MET  3.  Pt to demonstrate improved gait mechanics with midline upright posture, increased step height and improved cadence for at least 200' to decrease fall risk and improve safety with ambulating to/from bathroom.  Baseline:  Goal status: discharged pt reports she is planning on following up with PD PT  4.  Pt to demonstrate at least 5/5 bil hip strength for improved pelvic stability and functional squats without leakage.  Baseline:  Goal status: MET  5.  Pt to demonstrate at least 4/5 pelvic floor strength with ability to fully relax between contractions for improved pelvic stability  and decreased strain at pelvic floor Baseline:  Goal status: pt deferred internal needs as pain is much better and did not want to do this  6.  Pt to be I with bowel and bladder voiding techniques to decrease need to strain and feel fully empty. Baseline:  Goal status: MET  PLAN: PT FREQUENCY: 1x/week  PT DURATION:  4 sessions  PLANNED INTERVENTIONS: Therapeutic exercises, Therapeutic activity, Neuromuscular re-education, Balance training, Gait training, Patient/Family education, Self Care, Joint mobilization, Aquatic Therapy, Dry Needling, Spinal mobilization, Cryotherapy, Moist  heat, Manual lymph drainage, scar mobilization, Taping, Biofeedback, and Manual therapy  PLAN FOR NEXT SESSION:   PHYSICAL THERAPY DISCHARGE SUMMARY  Visits from Start of Care: 14  Current functional level related to goals / functional outcomes: All goals met expect gait goal which was not focus of treatment and pt plans to address this more with PD PT; internal strength goal not met as pt denied need for testing today   Remaining deficits: Pt does have pain remaining but 2/10 at highest now and much more tolerable.    Education / Equipment: HEP   Patient agrees to discharge. Patient goals were partially met. Patient is being discharged due to being pleased with the current functional level.   Stacy Gardner, PT, DPT 06/18/2309:21 AM

## 2022-06-19 DIAGNOSIS — C541 Malignant neoplasm of endometrium: Secondary | ICD-10-CM | POA: Diagnosis not present

## 2022-06-19 DIAGNOSIS — Z510 Encounter for antineoplastic radiation therapy: Secondary | ICD-10-CM | POA: Diagnosis not present

## 2022-06-20 DIAGNOSIS — Z510 Encounter for antineoplastic radiation therapy: Secondary | ICD-10-CM | POA: Diagnosis not present

## 2022-06-20 DIAGNOSIS — C541 Malignant neoplasm of endometrium: Secondary | ICD-10-CM | POA: Diagnosis not present

## 2022-06-23 ENCOUNTER — Ambulatory Visit: Payer: PPO | Admitting: Adult Health

## 2022-06-23 DIAGNOSIS — Z510 Encounter for antineoplastic radiation therapy: Secondary | ICD-10-CM | POA: Diagnosis not present

## 2022-06-23 DIAGNOSIS — C541 Malignant neoplasm of endometrium: Secondary | ICD-10-CM | POA: Diagnosis not present

## 2022-06-24 DIAGNOSIS — Z510 Encounter for antineoplastic radiation therapy: Secondary | ICD-10-CM | POA: Diagnosis not present

## 2022-06-24 DIAGNOSIS — C541 Malignant neoplasm of endometrium: Secondary | ICD-10-CM | POA: Diagnosis not present

## 2022-06-25 DIAGNOSIS — Z510 Encounter for antineoplastic radiation therapy: Secondary | ICD-10-CM | POA: Diagnosis not present

## 2022-06-25 DIAGNOSIS — C541 Malignant neoplasm of endometrium: Secondary | ICD-10-CM | POA: Diagnosis not present

## 2022-06-26 DIAGNOSIS — Z510 Encounter for antineoplastic radiation therapy: Secondary | ICD-10-CM | POA: Diagnosis not present

## 2022-06-26 DIAGNOSIS — C541 Malignant neoplasm of endometrium: Secondary | ICD-10-CM | POA: Diagnosis not present

## 2022-06-27 DIAGNOSIS — Z510 Encounter for antineoplastic radiation therapy: Secondary | ICD-10-CM | POA: Diagnosis not present

## 2022-06-27 DIAGNOSIS — C541 Malignant neoplasm of endometrium: Secondary | ICD-10-CM | POA: Diagnosis not present

## 2022-06-29 DIAGNOSIS — C541 Malignant neoplasm of endometrium: Secondary | ICD-10-CM | POA: Diagnosis not present

## 2022-06-29 DIAGNOSIS — Z510 Encounter for antineoplastic radiation therapy: Secondary | ICD-10-CM | POA: Diagnosis not present

## 2022-06-30 DIAGNOSIS — C541 Malignant neoplasm of endometrium: Secondary | ICD-10-CM | POA: Diagnosis not present

## 2022-06-30 DIAGNOSIS — Z510 Encounter for antineoplastic radiation therapy: Secondary | ICD-10-CM | POA: Diagnosis not present

## 2022-07-01 ENCOUNTER — Encounter: Payer: Self-pay | Admitting: Physical Therapy

## 2022-07-01 DIAGNOSIS — C541 Malignant neoplasm of endometrium: Secondary | ICD-10-CM | POA: Diagnosis not present

## 2022-07-01 DIAGNOSIS — Z510 Encounter for antineoplastic radiation therapy: Secondary | ICD-10-CM | POA: Diagnosis not present

## 2022-07-02 DIAGNOSIS — Z510 Encounter for antineoplastic radiation therapy: Secondary | ICD-10-CM | POA: Diagnosis not present

## 2022-07-02 DIAGNOSIS — M5459 Other low back pain: Secondary | ICD-10-CM | POA: Diagnosis not present

## 2022-07-02 DIAGNOSIS — M5416 Radiculopathy, lumbar region: Secondary | ICD-10-CM | POA: Diagnosis not present

## 2022-07-02 DIAGNOSIS — C541 Malignant neoplasm of endometrium: Secondary | ICD-10-CM | POA: Diagnosis not present

## 2022-07-02 DIAGNOSIS — M47896 Other spondylosis, lumbar region: Secondary | ICD-10-CM | POA: Diagnosis not present

## 2022-07-06 IMAGING — MG DIGITAL SCREENING BILAT W/ TOMO W/ CAD
6 of 12 series · 6 of 36 positions shown · non-contrast
Comparison: Previous exam(s).

CLINICAL DATA: Screening.

EXAM:
DIGITAL SCREENING BILATERAL MAMMOGRAM WITH TOMO AND CAD

[R MLO synth-2D (1 of 2)]
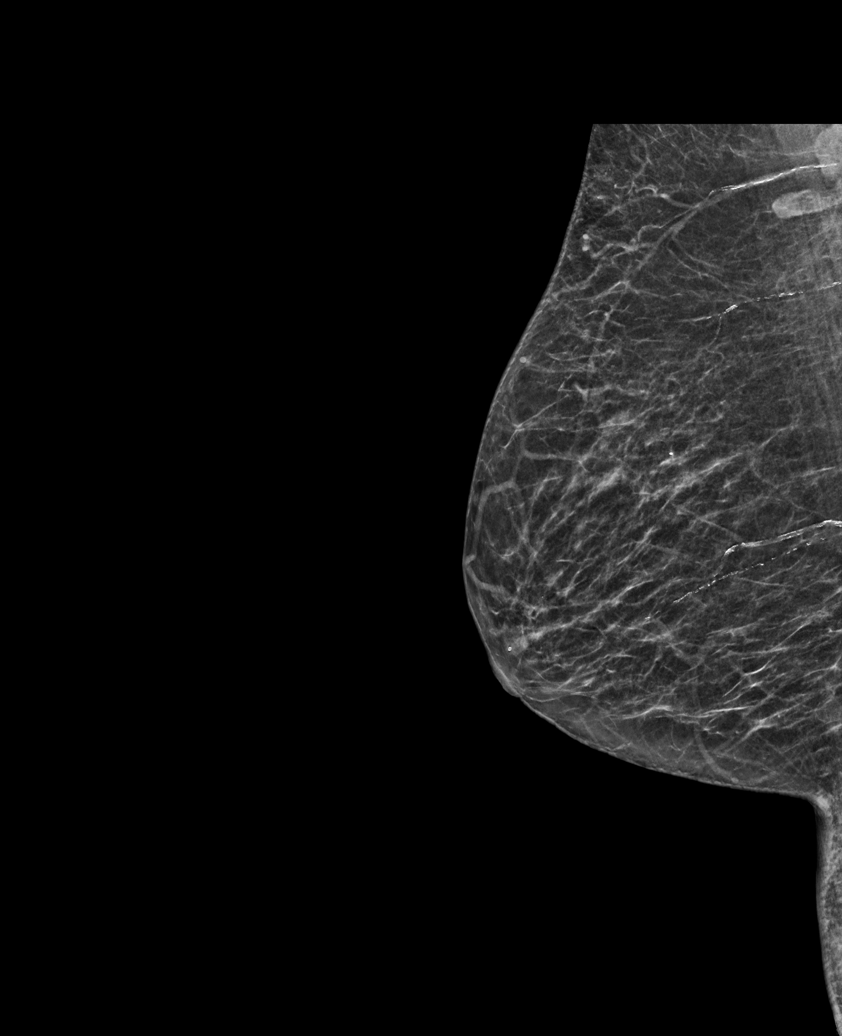

[L MLO synth-2D]
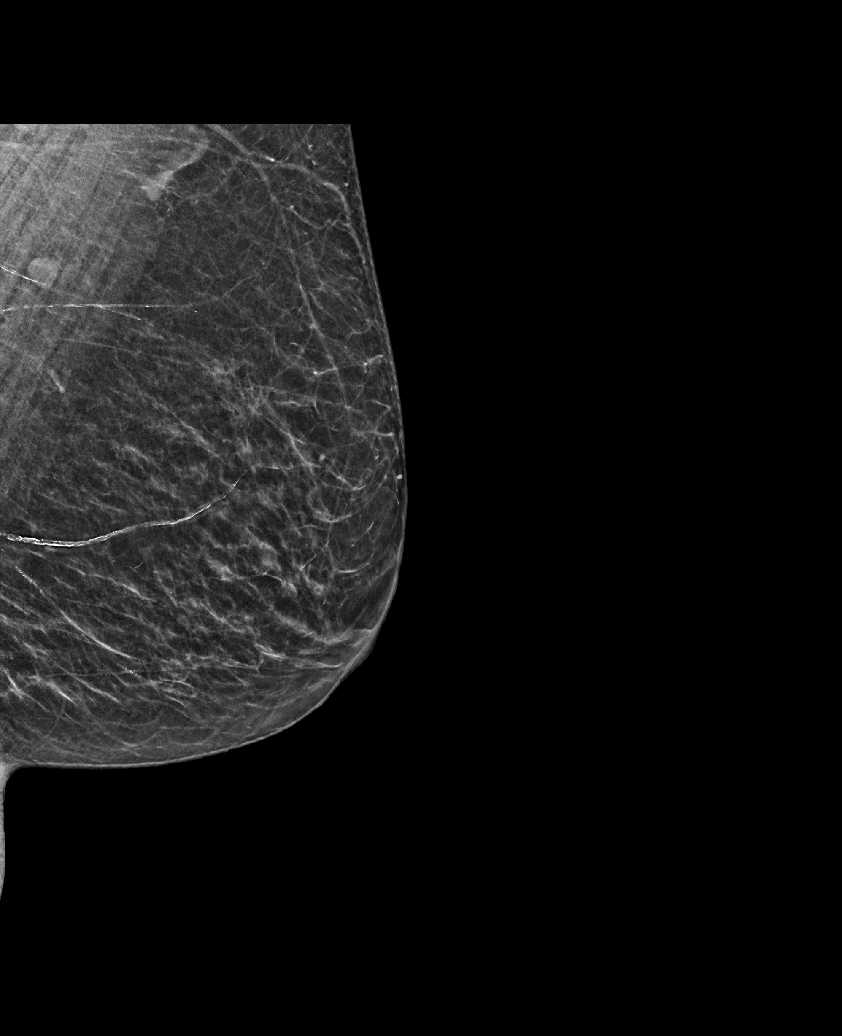

[R MLO synth-2D (2 of 2)]
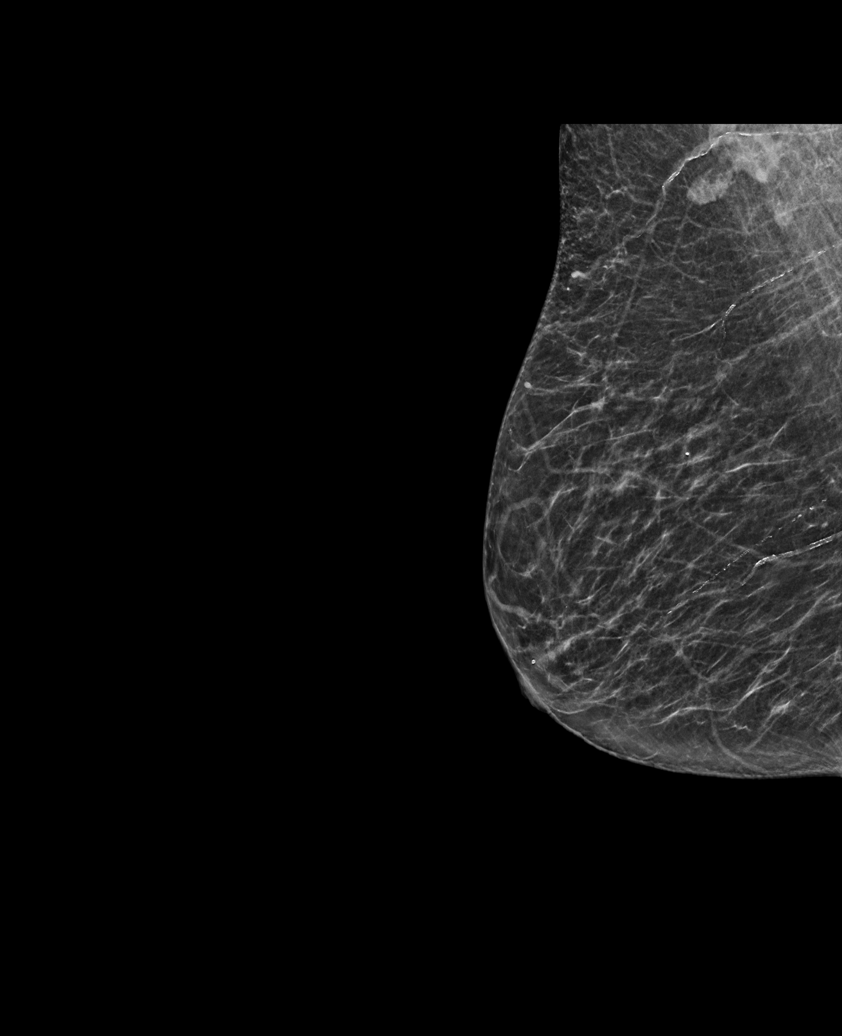

[L CC synth-2D]
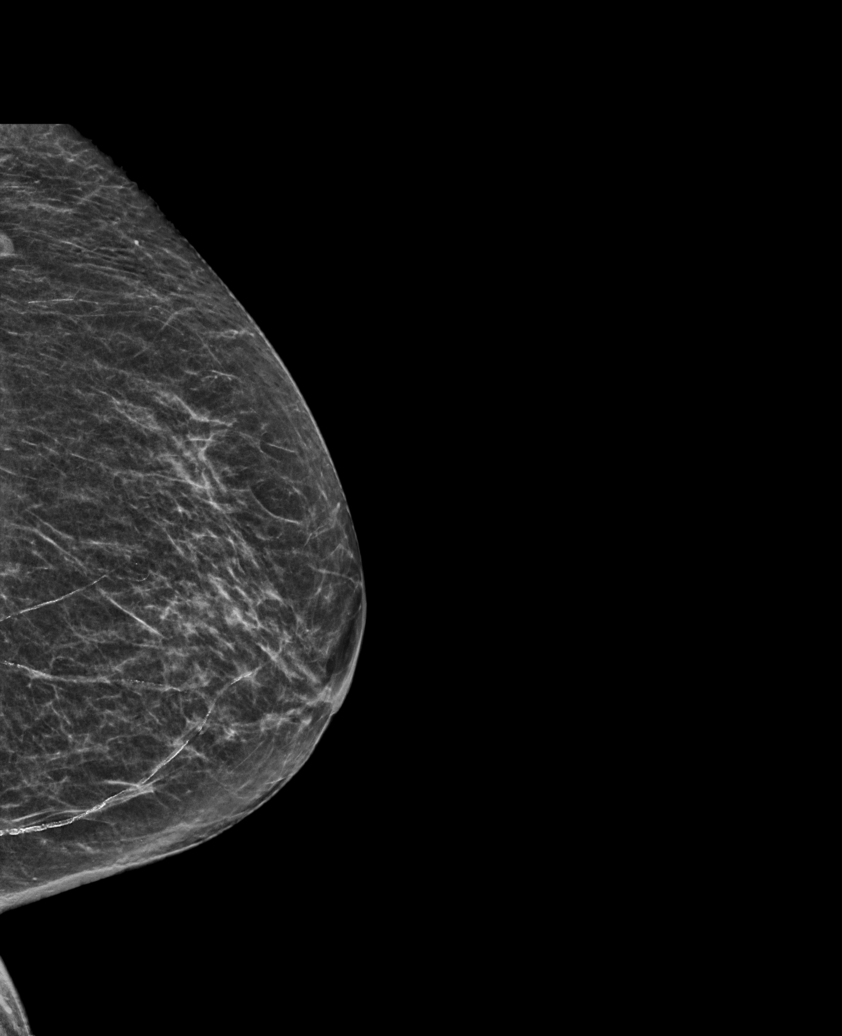

[R CC synth-2D (1 of 2)]
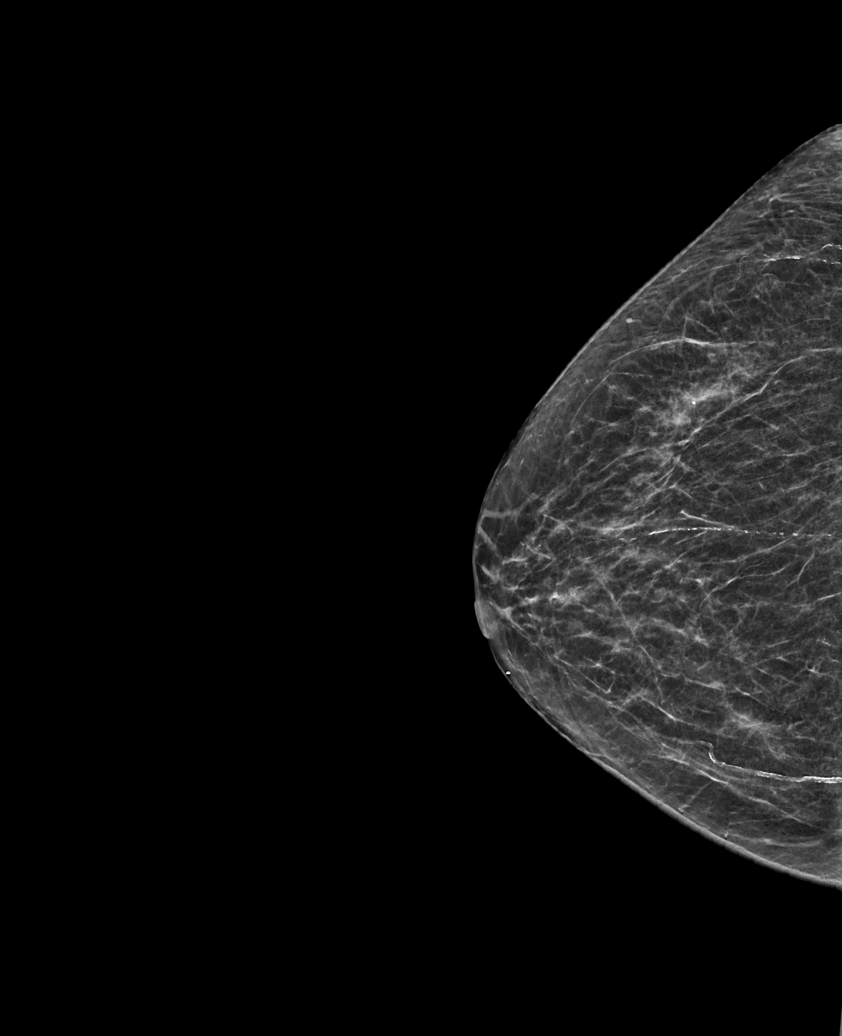

[R CC synth-2D (2 of 2)]
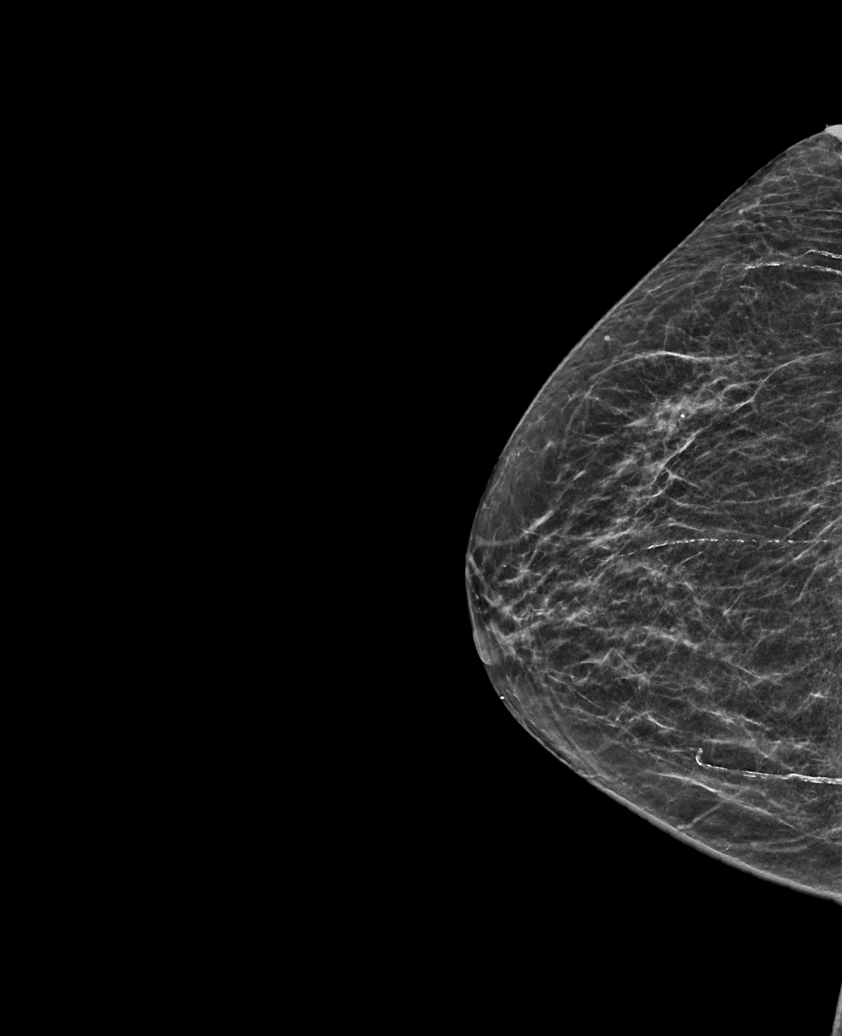

[6 of 36 positions shown; findings below may reference images not displayed]

ACR Breast Density Category b: There are scattered areas of
fibroglandular density.
FINDINGS: There are no findings suspicious for malignancy. Images were
processed with CAD.
IMPRESSION: No mammographic evidence of malignancy. A result letter of this
screening mammogram will be mailed directly to the patient.

RECOMMENDATION:
Screening mammogram in one year. (Code:[id number])

BI-RADS CATEGORY  1: Negative.

## 2022-07-07 DIAGNOSIS — Z510 Encounter for antineoplastic radiation therapy: Secondary | ICD-10-CM | POA: Diagnosis not present

## 2022-07-07 DIAGNOSIS — C541 Malignant neoplasm of endometrium: Secondary | ICD-10-CM | POA: Diagnosis not present

## 2022-07-08 DIAGNOSIS — C541 Malignant neoplasm of endometrium: Secondary | ICD-10-CM | POA: Diagnosis not present

## 2022-07-08 DIAGNOSIS — Z510 Encounter for antineoplastic radiation therapy: Secondary | ICD-10-CM | POA: Diagnosis not present

## 2022-07-09 DIAGNOSIS — C541 Malignant neoplasm of endometrium: Secondary | ICD-10-CM | POA: Diagnosis not present

## 2022-07-09 DIAGNOSIS — Z510 Encounter for antineoplastic radiation therapy: Secondary | ICD-10-CM | POA: Diagnosis not present

## 2022-07-10 DIAGNOSIS — Z510 Encounter for antineoplastic radiation therapy: Secondary | ICD-10-CM | POA: Diagnosis not present

## 2022-07-10 DIAGNOSIS — C541 Malignant neoplasm of endometrium: Secondary | ICD-10-CM | POA: Diagnosis not present

## 2022-07-11 DIAGNOSIS — C541 Malignant neoplasm of endometrium: Secondary | ICD-10-CM | POA: Diagnosis not present

## 2022-07-11 DIAGNOSIS — Z510 Encounter for antineoplastic radiation therapy: Secondary | ICD-10-CM | POA: Diagnosis not present

## 2022-07-14 DIAGNOSIS — Z510 Encounter for antineoplastic radiation therapy: Secondary | ICD-10-CM | POA: Diagnosis not present

## 2022-07-14 DIAGNOSIS — C541 Malignant neoplasm of endometrium: Secondary | ICD-10-CM | POA: Diagnosis not present

## 2022-07-15 DIAGNOSIS — G20A1 Parkinson's disease without dyskinesia, without mention of fluctuations: Secondary | ICD-10-CM | POA: Diagnosis not present

## 2022-07-15 DIAGNOSIS — Z510 Encounter for antineoplastic radiation therapy: Secondary | ICD-10-CM | POA: Diagnosis not present

## 2022-07-15 DIAGNOSIS — C541 Malignant neoplasm of endometrium: Secondary | ICD-10-CM | POA: Diagnosis not present

## 2022-07-16 DIAGNOSIS — G20A1 Parkinson's disease without dyskinesia, without mention of fluctuations: Secondary | ICD-10-CM | POA: Diagnosis not present

## 2022-07-16 DIAGNOSIS — Z6826 Body mass index (BMI) 26.0-26.9, adult: Secondary | ICD-10-CM | POA: Diagnosis not present

## 2022-07-16 DIAGNOSIS — I10 Essential (primary) hypertension: Secondary | ICD-10-CM | POA: Diagnosis not present

## 2022-07-17 ENCOUNTER — Ambulatory Visit: Payer: PPO

## 2022-07-17 DIAGNOSIS — C541 Malignant neoplasm of endometrium: Secondary | ICD-10-CM | POA: Diagnosis not present

## 2022-07-17 DIAGNOSIS — Z510 Encounter for antineoplastic radiation therapy: Secondary | ICD-10-CM | POA: Diagnosis not present

## 2022-07-17 DIAGNOSIS — M47816 Spondylosis without myelopathy or radiculopathy, lumbar region: Secondary | ICD-10-CM | POA: Diagnosis not present

## 2022-07-18 DIAGNOSIS — C541 Malignant neoplasm of endometrium: Secondary | ICD-10-CM | POA: Diagnosis not present

## 2022-07-18 DIAGNOSIS — Z510 Encounter for antineoplastic radiation therapy: Secondary | ICD-10-CM | POA: Diagnosis not present

## 2022-07-24 DIAGNOSIS — J029 Acute pharyngitis, unspecified: Secondary | ICD-10-CM | POA: Diagnosis not present

## 2022-07-24 DIAGNOSIS — I10 Essential (primary) hypertension: Secondary | ICD-10-CM | POA: Diagnosis not present

## 2022-07-24 DIAGNOSIS — G20B2 Parkinson's disease with dyskinesia, with fluctuations: Secondary | ICD-10-CM | POA: Diagnosis not present

## 2022-07-27 DIAGNOSIS — M5416 Radiculopathy, lumbar region: Secondary | ICD-10-CM | POA: Diagnosis not present

## 2022-07-28 ENCOUNTER — Telehealth: Payer: Self-pay | Admitting: Adult Health

## 2022-07-28 NOTE — Telephone Encounter (Signed)
No further action required, may keep appt with me.

## 2022-07-28 NOTE — Telephone Encounter (Signed)
Pt said, have increase in tremors, headache, BP up and down and dizziness.  Would like a call from the nurse to discuss a sooner appt.

## 2022-07-28 NOTE — Telephone Encounter (Signed)
I see from her note that pt saw [facility] 07-15-2022 and was noted pt will  see local neurologist (per note Dr. Carles Collet). Per Dr. Carles Collet (she declined as pt has seen multiple neurologists).

## 2022-07-29 NOTE — Telephone Encounter (Signed)
I called and LMVM for pt that will place on waitlist (pt has appt in 10/2022.  She saw [facility]ist in 07-15-2022.  I reiterated Dr. Guadelupe Sabin note that no further action required, keep f/u appt.  Placed on waitlist.

## 2022-07-30 ENCOUNTER — Telehealth: Payer: Self-pay | Admitting: Adult Health

## 2022-07-30 DIAGNOSIS — G20A1 Parkinson's disease without dyskinesia, without mention of fluctuations: Secondary | ICD-10-CM

## 2022-07-30 DIAGNOSIS — Z6827 Body mass index (BMI) 27.0-27.9, adult: Secondary | ICD-10-CM | POA: Diagnosis not present

## 2022-07-30 DIAGNOSIS — I951 Orthostatic hypotension: Secondary | ICD-10-CM | POA: Diagnosis not present

## 2022-07-30 DIAGNOSIS — Z9181 History of falling: Secondary | ICD-10-CM | POA: Diagnosis not present

## 2022-07-30 DIAGNOSIS — R55 Syncope and collapse: Secondary | ICD-10-CM | POA: Diagnosis not present

## 2022-07-30 DIAGNOSIS — I10 Essential (primary) hypertension: Secondary | ICD-10-CM | POA: Diagnosis not present

## 2022-07-30 NOTE — Telephone Encounter (Signed)
I called pt she answered then felt faint.  She called back.  Asking about getting another referral urgently to another PD specialist (she mentioned Dr. Linus Mako or other in [place]).  She is taking sinemet 1 tab 5 times daily.  Saw Dr. Truman Hayward at [facility] on 07-15-2022 (see notes).  She did not take the additional 0.5 tablet at noon. She has seen pcp, but did not mention referral to psychiatry recommended by Dr. Truman Hayward for anxiety.  She does not have regular transporation so relies on others to take her.  She said will be seeing cardiology for her bp's.    She was very emotional crying on and off).  I told her would be glad to send message to MM/NP about referral.  She is on waiting list for sooner appt.  Has appt with Dr. Rexene Alberts in 10/2022.

## 2022-07-30 NOTE — Telephone Encounter (Signed)
Pt is calling. Stated she wants a referral to a [facility]. Stated she wants to talk more with Jinny Blossom about this. Pt is requesting a call back from nuse.

## 2022-07-30 NOTE — Addendum Note (Signed)
Addended by: Brandon Melnick on: 07/30/2022 05:02 PM   Modules accepted: Orders

## 2022-07-30 NOTE — Telephone Encounter (Signed)
Order placed.  To be cosigned by provider.

## 2022-07-30 NOTE — Telephone Encounter (Signed)
Ok to place referral. State patient requested second opinion

## 2022-07-31 ENCOUNTER — Telehealth: Payer: Self-pay | Admitting: Adult Health

## 2022-07-31 NOTE — Telephone Encounter (Signed)
Referral for Neurology fax to [facility]. Phone: [phone], Fax: [phone]

## 2022-08-05 DIAGNOSIS — G20A1 Parkinson's disease without dyskinesia, without mention of fluctuations: Secondary | ICD-10-CM | POA: Diagnosis not present

## 2022-08-05 DIAGNOSIS — I951 Orthostatic hypotension: Secondary | ICD-10-CM | POA: Diagnosis not present

## 2022-08-05 DIAGNOSIS — I10 Essential (primary) hypertension: Secondary | ICD-10-CM | POA: Diagnosis not present

## 2022-08-05 DIAGNOSIS — Z6825 Body mass index (BMI) 25.0-25.9, adult: Secondary | ICD-10-CM | POA: Diagnosis not present

## 2022-08-05 DIAGNOSIS — G20B2 Parkinson's disease with dyskinesia, with fluctuations: Secondary | ICD-10-CM | POA: Diagnosis not present

## 2022-08-05 NOTE — Progress Notes (Deleted)
Office Visit    Patient Name: Ashley Neal Date of Encounter: 08/05/2022  Primary Care Provider:  Faustino Congress, NP Primary Cardiologist:  Ashley Grooms, MD Primary Electrophysiologist: None  Chief Complaint    Ashley Neal is a 76 y.o. female with PMH of HTN, HLD, GERD, Parkinson's disease who presents today for follow-up of hypotension.  Past Medical History    Past Medical History:  Diagnosis Date   Anxiety    Basal cell carcinoma    Depression    GERD (gastroesophageal reflux disease)    Hypertension    Neuromuscular disorder (HCC)    Osteoarthritis    Osteopenia    Parkinson disease    Pneumonia    Tremors of nervous system    legs   uterine ca 10/08/2021   also endometrial cancer   Past Surgical History:  Procedure Laterality Date   CARPAL TUNNEL RELEASE Bilateral 1996/1997   CYSTOSCOPY N/A 10/08/2021   Procedure: CYSTOSCOPY;  Surgeon: Lafonda Mosses, MD;  Location: [facility];  Service: Gynecology;  Laterality: N/A;   ROBOTIC ASSISTED TOTAL HYSTERECTOMY WITH BILATERAL SALPINGO OOPHERECTOMY N/A 10/08/2021   Procedure: XI ROBOTIC ASSISTED TOTAL HYSTERECTOMY WITH BILATERAL SALPINGO OOPHORECTOMY;SENTINEL LYMPH NODE INJECTION;  Surgeon: Lafonda Mosses, MD;  Location: [facility];  Service: Gynecology;  Laterality: N/A;   SQUAMOUS CELL CARCINOMA EXCISION Left 2017   Left jawline   TUBAL LIGATION  1980's    Allergies  Allergies  Allergen Reactions   Codeine Other (See Comments)    Nausea    Tape Other (See Comments)    Certain tapes pull skin off    History of Present Illness    Ashley Neal  is a 76 year old female with the above mention past medical history who presents today for follow-up of hypotension.  [name] was seen by Dr. Irish Neal and 01/2021 for complaint of orthostatic hypotension.  She reported increased episodes of hypotension and rare occasions of spikes in blood pressures, systolic at 578 or above.  She was  encouraged to stay well-hydrated especially in the settings of Parkinson's disease.   Since last being seen in the office patient reports***.  Patient denies chest pain, palpitations, dyspnea, PND, orthopnea, nausea, vomiting, dizziness, syncope, edema, weight gain, or early satiety.  ***Notes:  Home Medications    Current Outpatient Medications  Medication Sig Dispense Refill   Acetaminophen (TYLENOL PO) Take by mouth.     ALPRAZolam (XANAX) 0.25 MG tablet Take 0.25 mg by mouth as needed for anxiety.     aspirin EC 81 MG tablet Take 81 mg by mouth daily.     bisacodyl (DULCOLAX) 5 MG EC tablet Take by mouth.     carbidopa-levodopa (SINEMET IR) 25-100 MG tablet Take 1 tablet by mouth 5 (five) times daily. Spaced out by every 4 hours 450 tablet 3   Multiple Vitamin (MULTIVITAMIN) tablet Take 1 tablet by mouth daily.     Naproxen Sodium (ALEVE PO) Take by mouth as needed.     polyethylene glycol powder (GLYCOLAX/MIRALAX) 17 GM/SCOOP powder Take 17 g by mouth every other day.     No current facility-administered medications for this visit.     Review of Systems  Please see the history of present illness.    (+)*** (+)***  All other systems reviewed and are otherwise negative except as noted above.  Physical Exam    Wt Readings from Last 3 Encounters:  06/11/22 136 lb 6.4 oz (61.9 kg)  04/21/22 136 lb 9.6 oz (62 kg)  03/03/22 132 lb 12.8 oz (60.2 kg)   JO:INOMV were no vitals filed for this visit.,There is no height or weight on file to calculate BMI.  Constitutional:      Appearance: Healthy appearance. Not in distress.  Neck:     Vascular: JVD normal.  Pulmonary:     Effort: Pulmonary effort is normal.     Breath sounds: No wheezing. No rales. Diminished in the bases Cardiovascular:     Normal rate. Regular rhythm. Normal S1. Normal S2.      Murmurs: There is no murmur.  Edema:    Peripheral edema absent.  Abdominal:     Palpations: Abdomen is soft non tender. There  is no hepatomegaly.  Skin:    General: Skin is warm and dry.  Neurological:     General: No focal deficit present.     Mental Status: Alert and oriented to person, place and time.     Cranial Nerves: Cranial nerves are intact.  EKG/LABS/Other Studies Reviewed    ECG personally reviewed by me today - ***  Risk Assessment/Calculations:   {Does this patient have ATRIAL FIBRILLATION?:[id number]}        Lab Results  Component Value Date   WBC 4.8 01/31/2022   HGB 12.6 01/31/2022   HCT 36.7 01/31/2022   MCV 89.5 01/31/2022   PLT 181 01/31/2022   Lab Results  Component Value Date   CREATININE 0.85 01/31/2022   BUN 15 01/31/2022   NA 143 01/31/2022   K 4.0 01/31/2022   CL 107 01/31/2022   CO2 29 01/31/2022   Lab Results  Component Value Date   ALT 6 01/31/2022   AST 15 01/31/2022   ALKPHOS 53 01/31/2022   BILITOT 0.7 01/31/2022   No results found for: "CHOL", "HDL", "LDLCALC", "LDLDIRECT", "TRIG", "CHOLHDL"  No results found for: "HGBA1C"  Assessment & Plan    1.  Ortho static hypotension  2.  Hyperlipidemia  3.  Hypertension  4.  Parkinson's disease      Disposition: Follow-up with Ashley Grooms, MD or APP in *** months {Are you ordering a CV Procedure (e.g. stress test, cath, DCCV, TEE, etc)?   Press F2        :[id number]}   Medication Adjustments/Labs and Tests Ordered: Current medicines are reviewed at length with the patient today.  Concerns regarding medicines are outlined above.   Signed, Ashley Neal, Marissa Nestle, NP 08/05/2022, 7:19 PM [facility]  Note:  This document was prepared using Dragon voice recognition software and may include unintentional dictation errors.

## 2022-08-06 NOTE — Telephone Encounter (Signed)
Noted  

## 2022-08-06 NOTE — Telephone Encounter (Signed)
Received faxed note from [facility]- "we called the patient and she did not wish to undergo the evaluation at this time." FYI

## 2022-08-07 ENCOUNTER — Ambulatory Visit: Payer: PPO | Admitting: Nurse Practitioner

## 2022-08-07 DIAGNOSIS — I951 Orthostatic hypotension: Secondary | ICD-10-CM

## 2022-08-08 DIAGNOSIS — G20A1 Parkinson's disease without dyskinesia, without mention of fluctuations: Secondary | ICD-10-CM | POA: Diagnosis not present

## 2022-08-08 DIAGNOSIS — F32A Depression, unspecified: Secondary | ICD-10-CM | POA: Diagnosis not present

## 2022-08-12 NOTE — Telephone Encounter (Signed)
[facility] Notes received.  To MMNP for review.

## 2022-08-21 DIAGNOSIS — C541 Malignant neoplasm of endometrium: Secondary | ICD-10-CM | POA: Diagnosis not present

## 2022-08-21 DIAGNOSIS — C55 Malignant neoplasm of uterus, part unspecified: Secondary | ICD-10-CM | POA: Diagnosis not present

## 2022-08-26 DIAGNOSIS — M5432 Sciatica, left side: Secondary | ICD-10-CM | POA: Diagnosis not present

## 2022-08-26 DIAGNOSIS — M47896 Other spondylosis, lumbar region: Secondary | ICD-10-CM | POA: Diagnosis not present

## 2022-08-26 DIAGNOSIS — M5459 Other low back pain: Secondary | ICD-10-CM | POA: Diagnosis not present

## 2022-08-26 DIAGNOSIS — M533 Sacrococcygeal disorders, not elsewhere classified: Secondary | ICD-10-CM | POA: Diagnosis not present

## 2022-08-26 DIAGNOSIS — M5416 Radiculopathy, lumbar region: Secondary | ICD-10-CM | POA: Diagnosis not present

## 2022-09-01 ENCOUNTER — Ambulatory Visit
Admission: RE | Admit: 2022-09-01 | Discharge: 2022-09-01 | Disposition: A | Discharge status: Home / Self Care | Payer: PPO | Source: Ambulatory Visit | Attending: Family Medicine | Admitting: Family Medicine

## 2022-09-01 DIAGNOSIS — Z1231 Encounter for screening mammogram for malignant neoplasm of breast: Secondary | ICD-10-CM | POA: Diagnosis not present

## 2022-09-01 NOTE — Progress Notes (Deleted)
Office Visit    Patient Name: Ashley Neal Date of Encounter: 09/01/2022  Primary Care Provider:  Faustino Congress, NP Primary Cardiologist:  Larae Grooms, MD Primary Electrophysiologist: None  Chief Complaint    Ashley Neal is a 77 y.o. female with PMH of HTN, HLD, GERD, osteoarthritis, Shy Drager Syndrome, Parkinson's disease who presents today for follow-up of hypertension.  Past Medical History    Past Medical History:  Diagnosis Date   Anxiety    Basal cell carcinoma    Depression    GERD (gastroesophageal reflux disease)    Hypertension    Neuromuscular disorder (HCC)    Osteoarthritis    Osteopenia    Parkinson disease    Pneumonia    Tremors of nervous system    legs   uterine ca 10/08/2021   also endometrial cancer   Past Surgical History:  Procedure Laterality Date   CARPAL TUNNEL RELEASE Bilateral 1996/1997   CYSTOSCOPY N/A 10/08/2021   Procedure: CYSTOSCOPY;  Surgeon: Lafonda Mosses, MD;  Location: [facility];  Service: Gynecology;  Laterality: N/A;   ROBOTIC ASSISTED TOTAL HYSTERECTOMY WITH BILATERAL SALPINGO OOPHERECTOMY N/A 10/08/2021   Procedure: XI ROBOTIC ASSISTED TOTAL HYSTERECTOMY WITH BILATERAL SALPINGO OOPHORECTOMY;SENTINEL LYMPH NODE INJECTION;  Surgeon: Lafonda Mosses, MD;  Location: [facility];  Service: Gynecology;  Laterality: N/A;   SQUAMOUS CELL CARCINOMA EXCISION Left 2017   Left jawline   TUBAL LIGATION  1980's    Allergies  Allergies  Allergen Reactions   Codeine Other (See Comments)    Nausea    Tape Other (See Comments)    Certain tapes pull skin off    History of Present Illness    Ashley Neal  is a 77 year old female with the above mention past medical history who presents today for follow-up of hypertension.  Ashley Neal was initially seen by Dr. Irish Lack in 01/2021 for management or of orthostasis and hypotension.  She was started on lisinopril with an extra as needed 5 mg for BP spikes above 160  mmHg.    Since last being seen in the office patient reports***.  Patient denies chest pain, palpitations, dyspnea, PND, orthopnea, nausea, vomiting, dizziness, syncope, edema, weight gain, or early satiety.     ***Notes:  Home Medications    Current Outpatient Medications  Medication Sig Dispense Refill   Acetaminophen (TYLENOL PO) Take by mouth.     ALPRAZolam (XANAX) 0.25 MG tablet Take 0.25 mg by mouth as needed for anxiety.     aspirin EC 81 MG tablet Take 81 mg by mouth daily.     bisacodyl (DULCOLAX) 5 MG EC tablet Take by mouth.     carbidopa-levodopa (SINEMET IR) 25-100 MG tablet Take 1 tablet by mouth 5 (five) times daily. Spaced out by every 4 hours 450 tablet 3   Multiple Vitamin (MULTIVITAMIN) tablet Take 1 tablet by mouth daily.     Naproxen Sodium (ALEVE PO) Take by mouth as needed.     polyethylene glycol powder (GLYCOLAX/MIRALAX) 17 GM/SCOOP powder Take 17 g by mouth every other day.     No current facility-administered medications for this visit.     Review of Systems  Please see the history of present illness.    (+)*** (+)***  All other systems reviewed and are otherwise negative except as noted above.  Physical Exam    Wt Readings from Last 3 Encounters:  06/11/22 136 lb 6.4 oz (61.9 kg)  04/21/22 136 lb 9.6  oz (62 kg)  03/03/22 132 lb 12.8 oz (60.2 kg)   [id number] were no vitals filed for this visit.,There is no height or weight on file to calculate BMI.  Constitutional:      Appearance: Healthy appearance. Not in distress.  Neck:     Vascular: JVD normal.  Pulmonary:     Effort: Pulmonary effort is normal.     Breath sounds: No wheezing. No rales. Diminished in the bases Cardiovascular:     Normal rate. Regular rhythm. Normal S1. Normal S2.      Murmurs: There is no murmur.  Edema:    Peripheral edema absent.  Abdominal:     Palpations: Abdomen is soft non tender. There is no hepatomegaly.  Skin:    General: Skin is warm and dry.   Neurological:     General: No focal deficit present.     Mental Status: Alert and oriented to person, place and time.     Cranial Nerves: Cranial nerves are intact.  EKG/LABS/Other Studies Reviewed    ECG personally reviewed by me today - ***  Risk Assessment/Calculations:   {Does this patient have ATRIAL FIBRILLATION?:[phone]}        Lab Results  Component Value Date   WBC 4.8 01/31/2022   HGB 12.6 01/31/2022   HCT 36.7 01/31/2022   MCV 89.5 01/31/2022   PLT 181 01/31/2022   Lab Results  Component Value Date   CREATININE 0.85 01/31/2022   BUN 15 01/31/2022   NA 143 01/31/2022   K 4.0 01/31/2022   CL 107 01/31/2022   CO2 29 01/31/2022   Lab Results  Component Value Date   ALT 6 01/31/2022   AST 15 01/31/2022   ALKPHOS 53 01/31/2022   BILITOT 0.7 01/31/2022   No results found for: "CHOL", "HDL", "LDLCALC", "LDLDIRECT", "TRIG", "CHOLHDL"  No results found for: "HGBA1C"  Assessment & Plan    1.  Essential hypertension  2.***  3.  Parkinson's disease  4.***      Disposition: Follow-up with Larae Grooms, MD or APP in *** months {Are you ordering a CV Procedure (e.g. stress test, cath, DCCV, TEE, etc)?   Press F2        :[id number]   Medication Adjustments/Labs and Tests Ordered: Current medicines are reviewed at length with the patient today.  Concerns regarding medicines are outlined above.   Signed, Mable Fill, Marissa Nestle, NP 09/01/2022, 7:44 AM [facility]  Note:  This document was prepared using Dragon voice recognition software and may include unintentional dictation errors.

## 2022-09-02 ENCOUNTER — Ambulatory Visit: Payer: PPO | Admitting: Nurse Practitioner

## 2022-09-02 DIAGNOSIS — F4323 Adjustment disorder with mixed anxiety and depressed mood: Secondary | ICD-10-CM | POA: Diagnosis not present

## 2022-09-10 ENCOUNTER — Other Ambulatory Visit: Payer: Self-pay | Admitting: Family Medicine

## 2022-09-10 DIAGNOSIS — M533 Sacrococcygeal disorders, not elsewhere classified: Secondary | ICD-10-CM

## 2022-09-12 DIAGNOSIS — C541 Malignant neoplasm of endometrium: Secondary | ICD-10-CM | POA: Diagnosis not present

## 2022-09-12 DIAGNOSIS — F329 Major depressive disorder, single episode, unspecified: Secondary | ICD-10-CM | POA: Diagnosis not present

## 2022-09-12 DIAGNOSIS — Z9181 History of falling: Secondary | ICD-10-CM | POA: Diagnosis not present

## 2022-09-12 DIAGNOSIS — G214 Vascular parkinsonism: Secondary | ICD-10-CM | POA: Diagnosis not present

## 2022-09-12 DIAGNOSIS — M545 Low back pain, unspecified: Secondary | ICD-10-CM | POA: Diagnosis not present

## 2022-09-12 DIAGNOSIS — G20B2 Parkinson's disease with dyskinesia, with fluctuations: Secondary | ICD-10-CM | POA: Diagnosis not present

## 2022-09-12 DIAGNOSIS — F411 Generalized anxiety disorder: Secondary | ICD-10-CM | POA: Diagnosis not present

## 2022-09-12 DIAGNOSIS — I10 Essential (primary) hypertension: Secondary | ICD-10-CM | POA: Diagnosis not present

## 2022-09-12 DIAGNOSIS — I951 Orthostatic hypotension: Secondary | ICD-10-CM | POA: Diagnosis not present

## 2022-09-21 ENCOUNTER — Ambulatory Visit
Admission: RE | Admit: 2022-09-21 | Discharge: 2022-09-21 | Disposition: A | Discharge status: Home / Self Care | Payer: PPO | Source: Ambulatory Visit | Attending: Family Medicine | Admitting: Family Medicine

## 2022-09-21 DIAGNOSIS — M4697 Unspecified inflammatory spondylopathy, lumbosacral region: Secondary | ICD-10-CM | POA: Diagnosis not present

## 2022-09-21 DIAGNOSIS — M5126 Other intervertebral disc displacement, lumbar region: Secondary | ICD-10-CM | POA: Diagnosis not present

## 2022-09-21 DIAGNOSIS — M47816 Spondylosis without myelopathy or radiculopathy, lumbar region: Secondary | ICD-10-CM | POA: Diagnosis not present

## 2022-09-21 DIAGNOSIS — S76312A Strain of muscle, fascia and tendon of the posterior muscle group at thigh level, left thigh, initial encounter: Secondary | ICD-10-CM | POA: Diagnosis not present

## 2022-09-21 DIAGNOSIS — M533 Sacrococcygeal disorders, not elsewhere classified: Secondary | ICD-10-CM

## 2022-10-08 ENCOUNTER — Other Ambulatory Visit: Payer: Self-pay | Admitting: Neurology

## 2022-10-13 ENCOUNTER — Ambulatory Visit: Payer: PPO | Admitting: Neurology

## 2022-10-28 DIAGNOSIS — M5416 Radiculopathy, lumbar region: Secondary | ICD-10-CM | POA: Diagnosis not present

## 2022-11-11 DIAGNOSIS — M5416 Radiculopathy, lumbar region: Secondary | ICD-10-CM | POA: Diagnosis not present

## 2022-11-14 ENCOUNTER — Ambulatory Visit (INDEPENDENT_AMBULATORY_CARE_PROVIDER_SITE_OTHER): Payer: PPO | Admitting: Nurse Practitioner

## 2022-11-14 ENCOUNTER — Encounter: Payer: Self-pay | Admitting: Nurse Practitioner

## 2022-11-14 VITALS — BP 102/62 | HR 64 | Temp 97.7°F | Resp 16 | Ht 61.22 in | Wt 145.8 lb

## 2022-11-14 DIAGNOSIS — G20A1 Parkinson's disease without dyskinesia, without mention of fluctuations: Secondary | ICD-10-CM | POA: Diagnosis not present

## 2022-11-14 DIAGNOSIS — I10 Essential (primary) hypertension: Secondary | ICD-10-CM

## 2022-11-14 DIAGNOSIS — M5416 Radiculopathy, lumbar region: Secondary | ICD-10-CM

## 2022-11-14 DIAGNOSIS — F3341 Major depressive disorder, recurrent, in partial remission: Secondary | ICD-10-CM | POA: Diagnosis not present

## 2022-11-14 DIAGNOSIS — E782 Mixed hyperlipidemia: Secondary | ICD-10-CM

## 2022-11-14 NOTE — Progress Notes (Unsigned)
Careteam: Patient Care Team: Moshe CiproMatthews, Stephanie, NP as PCP - General (Family Medicine) Corky CraftsVaranasi, Jayadeep S, MD as PCP - Cardiology (Cardiology)  PLACE OF SERVICE:  [facility]  Advanced Directive information    Allergies  Allergen Reactions   Codeine Other (See Comments)    Nausea     Chief Complaint  Patient presents with   Establish Care    Blood pressures spikes high and drops low. Has caused her to pass out a couple times.      HPI: Patient is a 77 y.o. female to establish care. Previously at [facility] looking for more of a geriatric focused practice.   Has 2 sons and grandchildren.  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 Followed with [facility] and increasing sinemet 25-100 mg 1.5 tablet every 4 hours while awake for symptoms management.  Will have symptoms when medications start to wear off.   She has a son who is bipolar going through a divorce   Has had a lot of trouble with hip- found to have endometrial cancer and being followed by radiation oncology and oncology. Dr Pricilla Holmucker   Constipation- uses miralax and dulcolax   Depression- well controlled on lexapro 5 mg daily- helped anxiety and depression after 2 weeks.   She sees Dr Ethelene Halamos due to chronic back pain. Recently had steroid injection   Reports blood pressure in the evening is elevated.     Review of Systems:  Review of Systems  Constitutional:  Negative for chills, fever and weight loss.  HENT:  Negative for tinnitus.   Respiratory:  Negative for cough, sputum production and shortness of breath.   Cardiovascular:  Negative for chest pain, palpitations and leg swelling.  Gastrointestinal:  Negative for abdominal pain, constipation, diarrhea and heartburn.  Genitourinary:  Negative for dysuria, frequency and urgency.  Musculoskeletal:  Positive for back pain. Negative for falls, joint pain and myalgias.  Skin:  Negative.   Neurological:  Negative for dizziness and headaches.  Psychiatric/Behavioral:  Negative for depression and memory loss. The patient does not have insomnia.     Past Medical History:  Diagnosis Date   Anxiety    Basal cell carcinoma    Depression    Hypertension    Neuromuscular disorder    Osteoarthritis    Osteopenia    Parkinson disease    Pneumonia    Tremors of nervous system    legs   uterine ca 10/08/2021   also endometrial cancer   Past Surgical History:  Procedure Laterality Date   CARPAL TUNNEL RELEASE Bilateral 1996/1997   CYSTOSCOPY N/A 10/08/2021   Procedure: CYSTOSCOPY;  Surgeon: Carver Filaucker, Katherine R, MD;  Location: [facility];  Service: Gynecology;  Laterality: N/A;   ROBOTIC ASSISTED TOTAL HYSTERECTOMY WITH BILATERAL SALPINGO OOPHERECTOMY N/A 10/08/2021   Procedure: XI ROBOTIC ASSISTED TOTAL HYSTERECTOMY WITH BILATERAL SALPINGO OOPHORECTOMY;SENTINEL LYMPH NODE INJECTION;  Surgeon: Carver Filaucker, Katherine R, MD;  Location: [facility];  Service: Gynecology;  Laterality: N/A;   SQUAMOUS CELL CARCINOMA EXCISION Left 2017   Left jawline   TUBAL LIGATION  1980's   Social History:   reports that she has never smoked. She has never been exposed to tobacco smoke. She has never used smokeless tobacco. She reports that she does not currently use alcohol. She reports that she does not currently use drugs.  Family History  Problem Relation Age of Onset  Endometrial cancer Mother    Diabetes Mother    Alzheimer's disease Father    Coronary artery disease Father    Diabetes Sister    COPD Sister    Diabetes Sister    Heart disease Brother    Other Brother        MVA    Parkinson's disease Brother    Heart disease Brother    Alcohol abuse Brother    Heart disease Brother    Parkinson's disease Maternal Aunt    Breast cancer Maternal Aunt    Colon cancer Paternal Aunt    Parkinson's disease Paternal Aunt    Lung cancer Paternal Uncle    Healthy Son    Healthy Son     Prostate cancer Neg Hx    Pancreatic cancer Neg Hx    Ovarian cancer Neg Hx     Medications: Patient's Medications  New Prescriptions   No medications on file  Previous Medications   ACETAMINOPHEN (TYLENOL PO)    Take by mouth.   BISACODYL (DULCOLAX) 5 MG EC TABLET    Take by mouth.   CARBIDOPA-LEVODOPA (SINEMET IR) 25-100 MG TABLET    Take 1 tablet by mouth 5 (five) times daily. Spaced out by every 4 hours   ESCITALOPRAM (LEXAPRO) 5 MG TABLET    Take 5 mg by mouth daily.   MULTIPLE VITAMIN (MULTIVITAMIN) TABLET    Take 1 tablet by mouth daily.   POLYETHYLENE GLYCOL POWDER (GLYCOLAX/MIRALAX) 17 GM/SCOOP POWDER    Take 17 g by mouth daily.  Modified Medications   No medications on file  Discontinued Medications   ALPRAZOLAM (XANAX) 0.25 MG TABLET    Take 0.25 mg by mouth as needed for anxiety.   ASPIRIN EC 81 MG TABLET    Take 81 mg by mouth daily.   DULOXETINE HCL 20 MG CSDR    TAKE 1 CAPSULE BY MOUTH TWICE A DAY FOR 30 DAYS   NAPROXEN SODIUM (ALEVE PO)    Take by mouth as needed.    Physical Exam:  Vitals:   11/14/22 1351  BP: 102/62  Pulse: 64  Resp: 16  Temp: 97.7 F (36.5 C)  TempSrc: Temporal  SpO2: 94%  Weight: 145 lb 12.8 oz (66.1 kg)  Height: 5' 1.22" (1.555 m)   Body mass index is 27.35 kg/m. Wt Readings from Last 3 Encounters:  11/14/22 145 lb 12.8 oz (66.1 kg)  06/11/22 136 lb 6.4 oz (61.9 kg)  04/21/22 136 lb 9.6 oz (62 kg)    Physical Exam Constitutional:      General: She is not in acute distress.    Appearance: She is well-developed. She is not diaphoretic.  HENT:     Head: Normocephalic and atraumatic.     Mouth/Throat:     Pharynx: No oropharyngeal exudate.  Eyes:     Conjunctiva/sclera: Conjunctivae normal.     Pupils: Pupils are equal, round, and reactive to light.  Cardiovascular:     Rate and Rhythm: Normal rate and regular rhythm.     Heart sounds: Normal heart sounds.  Pulmonary:     Effort: Pulmonary effort is normal.      Breath sounds: Normal breath sounds.  Abdominal:     General: Bowel sounds are normal.     Palpations: Abdomen is soft.  Musculoskeletal:     Cervical back: Normal range of motion and neck supple.     Right lower leg: No edema.     Left lower leg: No  edema.  Skin:    General: Skin is warm and dry.  Neurological:     Mental Status: She is alert and oriented to person, place, and time.     Motor: Weakness present.     Gait: Gait abnormal.  Psychiatric:        Mood and Affect: Mood normal.     Labs reviewed: Basic Metabolic Panel: Recent Labs    01/31/22 2031  NA 143  K 4.0  CL 107  CO2 29  GLUCOSE 105*  BUN 15  CREATININE 0.85  CALCIUM 10.0   Liver Function Tests: Recent Labs    01/31/22 2031  AST 15  ALT 6  ALKPHOS 53  BILITOT 0.7  PROT 6.7  ALBUMIN 4.3   No results for input(s): "LIPASE", "AMYLASE" in the last 8760 hours. No results for input(s): "AMMONIA" in the last 8760 hours. CBC: Recent Labs    01/31/22 2031  WBC 4.8  NEUTROABS 3.2  HGB 12.6  HCT 36.7  MCV 89.5  PLT 181   Lipid Panel: No results for input(s): "CHOL", "HDL", "LDLCALC", "TRIG", "CHOLHDL", "LDLDIRECT" in the last 8760 hours. TSH: No results for input(s): "TSH" in the last 8760 hours. A1C: No results found for: "HGBA1C"   Assessment/Plan There are no diagnoses linked to this encounter.  Return in about 6 weeks (around 12/26/2022) for AWV.: ***  Sussie Minor K. Biagio Borg [facility] [phone]

## 2022-11-14 NOTE — Patient Instructions (Signed)
Sign record release from previous PCP.

## 2022-11-15 LAB — COMPLETE METABOLIC PANEL WITH GFR
AG Ratio: 2.3 (calc) (ref 1.0–2.5)
ALT: 5 U/L — ABNORMAL LOW (ref 6–29)
AST: 12 U/L (ref 10–35)
Albumin: 4.3 g/dL (ref 3.6–5.1)
Alkaline phosphatase (APISO): 66 U/L (ref 37–153)
BUN: 13 mg/dL (ref 7–25)
CO2: 31 mmol/L (ref 20–32)
Calcium: 9.7 mg/dL (ref 8.6–10.4)
Chloride: 104 mmol/L (ref 98–110)
Creat: 0.82 mg/dL (ref 0.60–1.00)
Globulin: 1.9 g/dL (calc) (ref 1.9–3.7)
Glucose, Bld: 127 mg/dL (ref 65–139)
Potassium: 3.6 mmol/L (ref 3.5–5.3)
Sodium: 144 mmol/L (ref 135–146)
Total Bilirubin: 0.5 mg/dL (ref 0.2–1.2)
Total Protein: 6.2 g/dL (ref 6.1–8.1)
eGFR: 74 mL/min/{1.73_m2} (ref >=60)

## 2022-11-15 LAB — CBC WITH DIFFERENTIAL/PLATELET
Absolute Monocytes: 644 cells/uL (ref 200–950)
Basophils Absolute: 29 cells/uL (ref 0–200)
Basophils Relative: 0.5 %
Eosinophils Absolute: 58 cells/uL (ref 15–500)
Eosinophils Relative: 1 %
HCT: 38 % (ref 35.0–45.0)
Hemoglobin: 12.9 g/dL (ref 11.7–15.5)
Lymphs Abs: 1462 cells/uL (ref 850–3900)
MCH: 31.1 pg (ref 27.0–33.0)
MCHC: 33.9 g/dL (ref 32.0–36.0)
MCV: 91.6 fL (ref 80.0–100.0)
MPV: 10.3 fL (ref 7.5–12.5)
Monocytes Relative: 11.1 %
Neutro Abs: 3608 cells/uL (ref 1500–7800)
Neutrophils Relative %: 62.2 %
Platelets: 249 10*3/uL (ref 140–400)
RBC: 4.15 10*6/uL (ref 3.80–5.10)
RDW: 13 % (ref 11.0–15.0)
Total Lymphocyte: 25.2 %
WBC: 5.8 10*3/uL (ref 3.8–10.8)

## 2022-11-15 LAB — LIPID PANEL
Cholesterol: 190 mg/dL (ref <200)
HDL: 53 mg/dL (ref >=50)
LDL Cholesterol (Calc): 86 mg/dL (calc)
Non-HDL Cholesterol (Calc): 137 mg/dL (calc) — ABNORMAL HIGH (ref <130)
Total CHOL/HDL Ratio: 3.6 (calc) (ref <5.0)
Triglycerides: 386 mg/dL — ABNORMAL HIGH (ref <150)

## 2022-11-27 ENCOUNTER — Ambulatory Visit: Payer: PPO | Admitting: Physical Therapy

## 2022-11-27 ENCOUNTER — Ambulatory Visit: Payer: PPO | Admitting: Occupational Therapy

## 2022-11-27 DIAGNOSIS — H2513 Age-related nuclear cataract, bilateral: Secondary | ICD-10-CM | POA: Diagnosis not present

## 2022-11-27 DIAGNOSIS — H04123 Dry eye syndrome of bilateral lacrimal glands: Secondary | ICD-10-CM | POA: Diagnosis not present

## 2022-11-27 DIAGNOSIS — H43813 Vitreous degeneration, bilateral: Secondary | ICD-10-CM | POA: Diagnosis not present

## 2022-11-27 DIAGNOSIS — H02831 Dermatochalasis of right upper eyelid: Secondary | ICD-10-CM | POA: Diagnosis not present

## 2022-11-27 DIAGNOSIS — H02834 Dermatochalasis of left upper eyelid: Secondary | ICD-10-CM | POA: Diagnosis not present

## 2022-12-04 ENCOUNTER — Ambulatory Visit (INDEPENDENT_AMBULATORY_CARE_PROVIDER_SITE_OTHER): Payer: PPO | Admitting: Orthopedic Surgery

## 2022-12-04 ENCOUNTER — Ambulatory Visit: Payer: PPO | Attending: Nurse Practitioner | Admitting: Occupational Therapy

## 2022-12-04 ENCOUNTER — Other Ambulatory Visit: Payer: Self-pay

## 2022-12-04 ENCOUNTER — Encounter: Payer: Self-pay | Admitting: Orthopedic Surgery

## 2022-12-04 ENCOUNTER — Ambulatory Visit: Payer: PPO

## 2022-12-04 VITALS — BP 138/78 | HR 88 | Temp 97.4°F | Resp 16 | Ht 61.22 in | Wt 147.2 lb

## 2022-12-04 DIAGNOSIS — R269 Unspecified abnormalities of gait and mobility: Secondary | ICD-10-CM | POA: Diagnosis not present

## 2022-12-04 DIAGNOSIS — R208 Other disturbances of skin sensation: Secondary | ICD-10-CM | POA: Diagnosis not present

## 2022-12-04 DIAGNOSIS — I10 Essential (primary) hypertension: Secondary | ICD-10-CM

## 2022-12-04 DIAGNOSIS — R2681 Unsteadiness on feet: Secondary | ICD-10-CM | POA: Insufficient documentation

## 2022-12-04 DIAGNOSIS — R29818 Other symptoms and signs involving the nervous system: Secondary | ICD-10-CM | POA: Diagnosis not present

## 2022-12-04 DIAGNOSIS — I951 Orthostatic hypotension: Secondary | ICD-10-CM

## 2022-12-04 DIAGNOSIS — M5459 Other low back pain: Secondary | ICD-10-CM | POA: Diagnosis not present

## 2022-12-04 DIAGNOSIS — M5416 Radiculopathy, lumbar region: Secondary | ICD-10-CM | POA: Diagnosis not present

## 2022-12-04 DIAGNOSIS — M6281 Muscle weakness (generalized): Secondary | ICD-10-CM | POA: Insufficient documentation

## 2022-12-04 DIAGNOSIS — R278 Other lack of coordination: Secondary | ICD-10-CM | POA: Insufficient documentation

## 2022-12-04 DIAGNOSIS — G20A1 Parkinson's disease without dyskinesia, without mention of fluctuations: Secondary | ICD-10-CM | POA: Diagnosis not present

## 2022-12-04 NOTE — Therapy (Signed)
OUTPATIENT OCCUPATIONAL THERAPY PARKINSON'S EVALUATION  Patient Name: Ashley Neal MRN: [id number] DOB:[birth year], 77 y.o., female Today's Date: 12/04/2022  PCP: [name], NP REFERRING PROVIDER: [name], NP  END OF SESSION:  OT End of Session - 12/04/22 1022     Visit Number 1    Number of Visits 9    Date for OT Re-Evaluation 01/30/23    Authorization Type [employer]    OT Start Time 0933    OT Stop Time 1018    OT Time Calculation (min) 45 min             Past Medical History:  Diagnosis Date   Anxiety    Basal cell carcinoma    Depression    Hypertension    Neuromuscular disorder    Osteoarthritis    Osteopenia    Parkinson disease    Pneumonia    Tremors of nervous system    legs   uterine ca 10/08/2021   also endometrial cancer   Past Surgical History:  Procedure Laterality Date   CARPAL TUNNEL RELEASE Bilateral 1996/1997   CYSTOSCOPY N/A 10/08/2021   Procedure: CYSTOSCOPY;  Surgeon: Carver Fila, MD;  Location: [facility];  Service: Gynecology;  Laterality: N/A;   ROBOTIC ASSISTED TOTAL HYSTERECTOMY WITH BILATERAL SALPINGO OOPHERECTOMY N/A 10/08/2021   Procedure: XI ROBOTIC ASSISTED TOTAL HYSTERECTOMY WITH BILATERAL SALPINGO OOPHORECTOMY;SENTINEL LYMPH NODE INJECTION;  Surgeon: Carver Fila, MD;  Location: [facility];  Service: Gynecology;  Laterality: N/A;   SQUAMOUS CELL CARCINOMA EXCISION Left 2017   Left jawline   TUBAL LIGATION  1980's   Patient Active Problem List   Diagnosis Date Noted   Endometrial cancer 10/16/2021   Complex atypical endometrial hyperplasia 09/27/2021   Primary osteoarthritis of both hands 05/14/2020   Primary osteoarthritis of both feet 05/14/2020   Age-related osteoporosis without current pathological fracture 05/14/2020   Prediabetes 05/14/2020   Essential hypertension 05/14/2020   History of hyperlipidemia 05/14/2020   History of squamous cell carcinoma 05/14/2020   Parkinsonism  05/14/2020    ONSET DATE: referral date 11/14/22  REFERRING DIAG: G20.A1 (ICD-10-CM) - Parkinson's disease, unspecified whether dyskinesia present, unspecified whether manifestations fluctuate M54.16 (ICD-10-CM) - Lumbar radiculopathy  THERAPY DIAG:  Other symptoms and signs involving the nervous system  Muscle weakness (generalized)  Other lack of coordination  Other disturbances of skin sensation  Rationale for Evaluation and Treatment: Rehabilitation  SUBJECTIVE:   SUBJECTIVE STATEMENT: "I hate Parkinson's"  Pt reports that her mother's sister and pt's brother both had PD.  Pt reports past 2 years have been awful; with onset of back pain, uterine cancer s/p hysterectomy and lymph node removal, and persistent back ache.  Pt reports that she has difficulty with managing buttons, feeding herself (especially cutting).  Pt reports she has been [facility].   Pt accompanied by: self  PERTINENT HISTORY: PD, HTN, osteoarthritis, chronic back pain, depression  PRECAUTIONS: Fall  WEIGHT BEARING RESTRICTIONS: No  PAIN:  Are you having pain? Yes: NPRS scale: 3-4/10 Pain location: pain in pelvis and lower back pain Pain description: radiating Aggravating factors: sitting Relieving factors: medication, xanax worked (but they are not prescribing it to her anymore)  FALLS: Has patient fallen in last 6 months? Yes. 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   PATIENT GOALS: to be able to maintain typical living skills to remain in home  OBJECTIVE:    HAND DOMINANCE: Right  ADLs: Transfers/ambulation related to ADLs: Independent Eating: typically will eat hand held items, difficulty with cutting foods Grooming: difficulty with styling hair UB Dressing: buttons are challenging, fastens bra in front LB Dressing: typically will lean up against something and complete in standing Toileting: Mod I, grab bar next to toilet Bathing: Mod I, has shower chair but doesn't use it Tub Shower transfers: had tub/shower remodeled to walk in shower Equipment: Shower seat with back, Grab bars, Walk in shower, Reacher, and Long handled shoe horn  IADLs: Shopping: order online, son will pick up for her Light housekeeping: reports that she can't clean as well as she used to Meal Prep: makes simple meals Community mobility: still driving Medication management: easy off caps, uses phone as reminder to take Handwriting: 90% legible, Increased time, Moderate micrographia, and PPT #1: 14.59 ([place])  MOBILITY STATUS: Hx of falls  POSTURE COMMENTS:  rounded shoulders  ACTIVITY TOLERANCE: Activity tolerance: WFL for tasks assessed on eval  FUNCTIONAL OUTCOME MEASURES: Fastening/unfastening 3 buttons: 1:07.94 Physical performance test: PPT#2 (simulated eating) 13.57 sec & PPT#4 (donning/doffing jacket): TBD  COORDINATION: 9 Hole Peg test: Right: 1:10.91 sec; Left: 57.75 sec Box and Blocks:  Right 48 blocks, Left 46 blocks Tremors: Resting and Right  UE ROM:  WFL  UE MMT:   WFL  SENSATION: Decreased sensation in finger tips in B hands  COGNITION: Overall cognitive status: Within functional limits for tasks assessed  OBSERVATIONS: Bradykinesia   TODAY'S TREATMENT:                                                                                                                              12/04/22 Eval only   PATIENT EDUCATION: Education details: Educated on role and purpose of OT as well as potential interventions  and goals for therapy based on initial evaluation findings. Person educated: Patient Education method: Explanation Education comprehension: verbalized understanding and needs further education  HOME EXERCISE PROGRAM: TBD  GOALS: Goals reviewed with patient? Yes  SHORT TERM GOALS: Target date: 01/02/23  Pt will be independent with PD specific HEP. Baseline: Goal status: INITIAL  2.  Pt will verbalize understanding of adapted strategies to maximize safety and I with ADLs/ IADLs. Baseline:  Goal status: INITIAL  3.  Pt will demonstrate improved fine motor coordination for ADLs as evidenced by decreasing 9 hole peg test score for RUE by 5 secs Baseline: 9 Hole Peg test: Right: 1:10.91 sec; Left: 57.75 sec Goal status: INITIAL  4.  Pt will demonstrate improved grip strength as needed to mange clothing fasteners and open various household  containers. Baseline: grip strength TBD Goal status: INITIAL   LONG TERM GOALS: Target date: 01/30/23  Pt will write a short paragraph with 100% legibility and no significant decrease in letter size Baseline: 90% legibility with mild-mod decrease in size Goal status: INITIAL  2.  Pt will demonstrate improved ease with fastening buttons as evidenced by decreasing 3 button/ unbutton time to <60 seconds. Baseline:  3 buttons: 1:07.94 Goal status: INITIAL  3.  Pt will demonstrate improved fine motor coordination for ADLs as evidenced by decreasing 9 hole peg test score for RUE by 10 sec and LUE by 5 secs Baseline: 9 Hole Peg test: Right: 1:10.91 sec; Left: 57.75 sec Goal status: INITIAL  4.  Pt will demonstrate improved ease with feeding as evidenced by decreasing PPT#2 (self feeding) by 3 secs Baseline: 13.57 sec Goal status: INITIAL  5.  Pt will verbalize understanding of ways to prevent future PD related complications and PD community resources. Baseline:  Goal status: INITIAL  ASSESSMENT:  CLINICAL IMPRESSION: Patient is a 77 y.o.  female who was seen today for occupational therapy evaluation for impairments with [facility] due to PD. Pt demonstrating bradykinesia, decreased grip strength, and impaired sensation impacting ease with simulated ADLs.  Pt's adult son recently moved in with pt and aids in some tasks, particularly grocery pick up.  Pt resides in 1 level home with 2 steps to enter and small threshold in/out of kitchen.  Pt will benefit from skilled occupational therapy services to address strength and coordination, ROM, pain management, altered sensation, balance, GM/FM control, safety awareness, introduction of compensatory strategies/AE prn, and implementation of an HEP to improve participation and safety during ADLs and IADLs.   PERFORMANCE DEFICITS: in functional skills including ADLs, IADLs, coordination, sensation, ROM, strength, pain, Fine motor control, Gross motor control, balance, body mechanics, endurance, decreased knowledge of precautions, decreased knowledge of use of DME, and UE functional use and psychosocial skills including coping strategies and environmental adaptation.   IMPAIRMENTS: are limiting patient from ADLs and IADLs.   COMORBIDITIES:  may have co-morbidities  that affects occupational performance. Patient will benefit from skilled OT to address above impairments and improve overall function.  MODIFICATION OR ASSISTANCE TO COMPLETE EVALUATION: No modification of tasks or assist necessary to complete an evaluation.  OT OCCUPATIONAL PROFILE AND HISTORY: Problem focused assessment: Including review of records relating to presenting problem.  CLINICAL DECISION MAKING: LOW - limited treatment options, no task modification necessary  REHAB POTENTIAL: Good  EVALUATION COMPLEXITY: Low    PLAN:  OT FREQUENCY: 1-2x/week  OT DURATION: 8 weeks  PLANNED INTERVENTIONS: self care/ADL training, therapeutic exercise, therapeutic activity, neuromuscular re-education, manual therapy, passive range of  motion, balance training, functional mobility training, ultrasound, compression bandaging, moist heat, cryotherapy, contrast bath, patient/family education, psychosocial skills training, energy conservation, coping strategies training, and DME and/or AE instructions  RECOMMENDED OTHER SERVICES: NA  CONSULTED AND AGREED WITH PLAN OF CARE: Patient  PLAN FOR NEXT SESSION: Initiate coordination HEP and large amplitude exercises.  Assess grip strength and don/doff jacket   Rahmon Heigl, OTR/L 12/04/2022, 10:25 AM

## 2022-12-04 NOTE — Patient Instructions (Addendum)
Please take blood pressure three times daily> once in morning/ afternoon and evening> do not take if in pain or feeling anxiety  If blood pressure > 180> take lisinopril 10 mg > please record when you took these doses  If blood pressure > 150> take lisinopril 5 mg > please record when you took these doses

## 2022-12-04 NOTE — Therapy (Signed)
OUTPATIENT PHYSICAL THERAPY NEURO EVALUATION   Patient Name: Ashley Neal MRN: [id number] DOB:05-Aug-1946, 77 y.o., female Today's Date: 12/04/2022   PCP: [name], NP REFERRING PROVIDER: [name], NP  END OF SESSION:  PT End of Session - 12/04/22 0838     Visit Number 1    Number of Visits 6    Date for PT Re-Evaluation 01/15/23    Authorization Type [employer]    PT Start Time 0845    PT Stop Time 0930    PT Time Calculation (min) 45 min             Past Medical History:  Diagnosis Date   Anxiety    Basal cell carcinoma    Depression    Hypertension    Neuromuscular disorder    Osteoarthritis    Osteopenia    Parkinson disease    Pneumonia    Tremors of nervous system    legs   uterine ca 10/08/2021   also endometrial cancer   Past Surgical History:  Procedure Laterality Date   CARPAL TUNNEL RELEASE Bilateral 1996/1997   CYSTOSCOPY N/A 10/08/2021   Procedure: CYSTOSCOPY;  Surgeon: Carver Fila, MD;  Location: [facility];  Service: Gynecology;  Laterality: N/A;   ROBOTIC ASSISTED TOTAL HYSTERECTOMY WITH BILATERAL SALPINGO OOPHERECTOMY N/A 10/08/2021   Procedure: XI ROBOTIC ASSISTED TOTAL HYSTERECTOMY WITH BILATERAL SALPINGO OOPHORECTOMY;SENTINEL LYMPH NODE INJECTION;  Surgeon: Carver Fila, MD;  Location: [facility];  Service: Gynecology;  Laterality: N/A;   SQUAMOUS CELL CARCINOMA EXCISION Left 2017   Left jawline   TUBAL LIGATION  1980's   Patient Active Problem List   Diagnosis Date Noted   Endometrial cancer 10/16/2021   Complex atypical endometrial hyperplasia 09/27/2021   Primary osteoarthritis of both hands 05/14/2020   Primary osteoarthritis of both feet 05/14/2020   Age-related osteoporosis without current pathological fracture 05/14/2020   Prediabetes 05/14/2020   Essential hypertension 05/14/2020   History of hyperlipidemia 05/14/2020   History of squamous cell carcinoma 05/14/2020   Parkinsonism  05/14/2020    ONSET DATE: several years  REFERRING DIAG: G20.A1 (ICD-10-CM) - Parkinson's disease, unspecified whether dyskinesia present, unspecified whether manifestations fluctuate M54.16 (ICD-10-CM) - Lumbar radiculopathy  THERAPY DIAG:  Abnormality of gait and mobility  Other low back pain  Unsteadiness on feet  Rationale for Evaluation and Treatment: Rehabilitation  SUBJECTIVE:                                                                                                                                                                                             SUBJECTIVE STATEMENT: Hx  of PD over past several years. Also experiencing back pain and and left buttocks pain over the past few years.  Pt reports hx of 28 shots in her back, hip and coccyx which have not helped very much. Pt notes difficulty in walking and difficulty with turning.  Pt reports PD medications have been increased and has had syncopal episodes over the past few months (notes postural hypotension)  Pt accompanied by: self  PERTINENT HISTORY: She sees Dr Ethelene Hal due to chronic back pain. Recently had steroid injection  PAIN:  Are you having pain? Yes: NPRS scale: varies/10 Pain location: centered around coccyx left side Pain description: heat on surface of area of pain, burning/searing pain Aggravating factors: sitting, prolonged walking, prolonged standing   Relieving factors: using horseshoe cushion, walking  PRECAUTIONS: None  WEIGHT BEARING RESTRICTIONS: No  FALLS: Has patient fallen in last 6 months? Yes. Number of falls 3--reports these were the syncopal episodes  LIVING ENVIRONMENT: Lives with: lives with their family and lives with their son Lives in: House/apartment Stairs: 1 STE Has following equipment at home: shower chair and Grab bars  PLOF: Independent with basic ADLs, Independent with household mobility without device, and Independent with community mobility without device  PATIENT  GOALS: decr back pain and improve mobility  OBJECTIVE:   DIAGNOSTIC FINDINGS:  MRI Pelvis IMPRESSION: 1. No acute findings or clear explanation for the patient's symptoms. 2. Although incompletely visualized, grossly stable partial tear of the left common hamstring tendon. Mild gluteus tendinosis bilaterally. 3. No evidence of sacral fracture or metastatic disease. Mild sacroiliac degenerative changes bilaterally. IMPRESSION: 1. Mild left foraminal narrowing at L3-4 and L5-S1. 2. Mild foraminal narrowing bilaterally at L4-5. 3. Slight anterolisthesis at L3-4 and L4-5.  COGNITION: Overall cognitive status: Within functional limits for tasks assessed   SENSATION:   COORDINATION: Difficulty with rapid alternating  EDEMA:  none  MUSCLE TONE: WNL  MUSCLE LENGTH: DNT     POSTURE: flexed trunk   LOWER EXTREMITY ROM:     WNL  LOWER EXTREMITY MMT:    Grossly 4/5 BLE    BED MOBILITY:  indep  TRANSFERS: Assistive device utilized: None  Sit to stand: Complete Independence Stand to sit: Complete Independence Chair to chair: Complete Independence Floor:  NT   CURB:  Level of Assistance: Complete Independence Assistive device utilized: None Curb Comments:   STAIRS: Level of Assistance: Modified independence Stair Negotiation Technique: Alternating Pattern  with Single Rail on Right Number of Stairs:   Height of Stairs:   Comments:   GAIT: Gait pattern: WFL Distance walked:  Assistive device utilized: None Level of assistance: Complete Independence Comments:   FUNCTIONAL TESTS:  5 times sit to stand: 7 sec Timed up and go (TUG): 6.85 Mini-BesTest:21/28  TUG test: 6.85 sec  M-CTSIB  Condition 1: Firm Surface, EO 30 Sec, Normal Sway  Condition 2: Firm Surface, EC 30 Sec, Normal Sway  Condition 3: Foam Surface, EO 30 Sec, Mild Sway  Condition 4: Foam Surface, EC 30 Sec, Moderate Sway         PATIENT EDUCATION: Education details:  assessment findings Person educated: Patient Education method: Explanation Education comprehension: verbalized understanding  HOME EXERCISE PROGRAM: TBD  GOALS: Goals reviewed with patient? Yes  SHORT TERM GOALS: Target date: 12/25/2022    Patient will be independent in HEP to improve functional outcomes Baseline: Goal status: INITIAL  2. Demo improved balance/postural control as evidenced by normal-mild sway Condition 4 M-CTSIB  Baseline: Moderate  Goal status:  INITIAL   LONG TERM GOALS: Target date: 01/15/2023    Demo improved balance and postural control per score 24/28 MiniBESTest Baseline: 21/28 Goal status: INITIAL  2.  Pt to demo independent HEP for balance and lumbar issues Baseline:  Goal status: INITIAL    ASSESSMENT:  CLINICAL IMPRESSION: Patient is a 77 y.o. lady who was seen today for physical therapy evaluation and treatment for PD and chronic LBP.  Exhibits deficits in balance and postural control as evidenced by score on Mini-BESTest.  Pt reports chronic LBP which limits sitting, standing, and walking tolerance.  Patient would benefit from skilled services to increase overall strength, dynamic balance, and facilitate strategies for pain mgmt to improve functional mobility and status.    OBJECTIVE IMPAIRMENTS: decreased activity tolerance, decreased balance, decreased coordination, decreased endurance, decreased knowledge of use of DME, decreased strength, improper body mechanics, postural dysfunction, and pain.   ACTIVITY LIMITATIONS: carrying, sitting, standing, and locomotion level  PARTICIPATION LIMITATIONS: shopping and community activity  PERSONAL FACTORS: Age, Time since onset of injury/illness/exacerbation, and 1 comorbidity: PD and LBP  are also affecting patient's functional outcome.   REHAB POTENTIAL: Good  CLINICAL DECISION MAKING: Stable/uncomplicated  EVALUATION COMPLEXITY: Low  PLAN:  PT FREQUENCY: 1x/week  PT DURATION: 6  weeks  PLANNED INTERVENTIONS: Therapeutic exercises, Therapeutic activity, Neuromuscular re-education, Balance training, Gait training, Patient/Family education, Self Care, Joint mobilization, Joint manipulation, Stair training, Vestibular training, Canalith repositioning, Orthotic/Fit training, DME instructions, Aquatic Therapy, Dry Needling, Electrical stimulation, Cryotherapy, Moist heat, Traction, Ultrasound, Ionotophoresis /ml Dexamethasone, and Manual therapy  PLAN FOR NEXT SESSION: list of PD-specific community activities, more lumbar specific assessment   12:56 PM, 12/04/22 M. Shary Decamp, PT, DPT Physical Therapist- [place] Office Number: [phone]

## 2022-12-04 NOTE — Progress Notes (Signed)
Careteam: Patient Care Team: [name], NP as PCP - General (Geriatric Medicine) Corky Crafts, MD as PCP - Cardiology (Cardiology)  Seen by: Hazle Nordmann, AGNP-C  PLACE OF SERVICE:  [facility]  Advanced Directive information Does Patient Have a Medical Advance Directive?: Yes, Type of Advance Directive: Healthcare Power of [place];Living will, Does patient want to make changes to medical advance directive?: No - Patient declined  Allergies  Allergen Reactions   Codeine Other (See Comments)    Nausea     Chief Complaint  Patient presents with   Acute Visit    Blood pressure check.     HPI: Patient is a 77 y.o. female seen today for acute visit due to elevated blood pressure readings at home.   She reports fluctuating blood pressure since starting carbidopa. She reports taking blood pressure three times daily. She did not bring readings with her today. She will also stand and have lower pressures at times> sometimes dizziness occurs. At rest, blood pressures will be high, especially in evening. She will also experience headaches. She does have lisinopril prn for elevated blood pressures. She is only using lisinopril 5 mg qhs prn about 1-2x/ weekly. She has been advised to consume electrolyte drink daily. She has been drinking 2 Propel daily.   No recent falls or injuries.   In office orthostatic blood pressures.  Laying> Unable to perform due to chronic back pain Sitting> 138/68 Standing> 110/68    Review of Systems:  Review of Systems  Constitutional:  Negative for chills and fever.  Respiratory:  Negative for cough, shortness of breath and wheezing.   Cardiovascular:  Negative for chest pain and leg swelling.  Musculoskeletal:  Positive for back pain. Negative for falls.  Neurological:  Positive for dizziness, weakness and headaches.  Psychiatric/Behavioral:  Negative for depression. The patient is not nervous/anxious.     Past Medical History:   Diagnosis Date   Anxiety    Basal cell carcinoma    Depression    Hypertension    Neuromuscular disorder    Osteoarthritis    Osteopenia    Parkinson disease    Pneumonia    Tremors of nervous system    legs   uterine ca 10/08/2021   also endometrial cancer   Past Surgical History:  Procedure Laterality Date   CARPAL TUNNEL RELEASE Bilateral 1996/1997   CYSTOSCOPY N/A 10/08/2021   Procedure: CYSTOSCOPY;  Surgeon: Carver Fila, MD;  Location: [facility];  Service: Gynecology;  Laterality: N/A;   ROBOTIC ASSISTED TOTAL HYSTERECTOMY WITH BILATERAL SALPINGO OOPHERECTOMY N/A 10/08/2021   Procedure: XI ROBOTIC ASSISTED TOTAL HYSTERECTOMY WITH BILATERAL SALPINGO OOPHORECTOMY;SENTINEL LYMPH NODE INJECTION;  Surgeon: Carver Fila, MD;  Location: [facility];  Service: Gynecology;  Laterality: N/A;   SQUAMOUS CELL CARCINOMA EXCISION Left 2017   Left jawline   TUBAL LIGATION  1980's   Social History:   reports that she has never smoked. She has never been exposed to tobacco smoke. She has never used smokeless tobacco. She reports current alcohol use. She reports that she does not currently use drugs.  Family History  Problem Relation Age of Onset   Endometrial cancer Mother    Diabetes Mother    Alzheimer's disease Father    Coronary artery disease Father    Diabetes Sister    COPD Sister    Diabetes Sister    Heart disease Brother    Other Brother        MVA  Parkinson's disease Brother    Heart disease Brother    Alcohol abuse Brother    Heart disease Brother    Parkinson's disease Maternal Aunt    Breast cancer Maternal Aunt    Colon cancer Paternal Aunt    Parkinson's disease Paternal Aunt    Lung cancer Paternal Uncle    Healthy Son    Healthy Son    Prostate cancer Neg Hx    Pancreatic cancer Neg Hx    Ovarian cancer Neg Hx     Medications: Patient's Medications  New Prescriptions   No medications on file  Previous Medications   ACETAMINOPHEN (TYLENOL  PO)    Take by mouth.   BISACODYL (DULCOLAX) 5 MG EC TABLET    Take by mouth.   CARBIDOPA-LEVODOPA (SINEMET IR) 25-100 MG TABLET    Take 1.5 tablets by mouth 5 (five) times daily.   ESCITALOPRAM (LEXAPRO) 5 MG TABLET    Take 5 mg by mouth daily.   MULTIPLE VITAMIN (MULTIVITAMIN) TABLET    Take 1 tablet by mouth daily.   POLYETHYLENE GLYCOL POWDER (GLYCOLAX/MIRALAX) 17 GM/SCOOP POWDER    Take 17 g by mouth daily.  Modified Medications   No medications on file  Discontinued Medications   CARBIDOPA-LEVODOPA (SINEMET IR) 25-100 MG TABLET    Take 1 tablet by mouth 5 (five) times daily. Spaced out by every 4 hours    Physical Exam:  Vitals:   12/04/22 1528 12/04/22 1533  BP: (!) 140/88 138/78  Pulse: 88   Resp: 16   Temp: (!) 97.4 F (36.3 C)   SpO2: 97%   Weight: 147 lb 3.2 oz (66.8 kg)   Height: 5' 1.22" (1.555 m)    Body mass index is 27.61 kg/m. Wt Readings from Last 3 Encounters:  12/04/22 147 lb 3.2 oz (66.8 kg)  11/14/22 145 lb 12.8 oz (66.1 kg)  06/11/22 136 lb 6.4 oz (61.9 kg)    Physical Exam Vitals reviewed.  Constitutional:      General: She is not in acute distress. HENT:     Head: Normocephalic.  Eyes:     General:        Right eye: No discharge.        Left eye: No discharge.  Neck:     Vascular: No carotid bruit.     Comments: No JVD Cardiovascular:     Rate and Rhythm: Normal rate and regular rhythm.     Pulses: Normal pulses.     Heart sounds: Normal heart sounds.  Pulmonary:     Effort: Pulmonary effort is normal. No respiratory distress.     Breath sounds: Normal breath sounds. No wheezing.  Abdominal:     General: Bowel sounds are normal.     Palpations: Abdomen is soft.  Musculoskeletal:     Cervical back: Neck supple.     Right lower leg: No edema.     Left lower leg: No edema.  Skin:    General: Skin is warm.     Capillary Refill: Capillary refill takes less than 2 seconds.  Neurological:     General: No focal deficit present.      Mental Status: She is alert and oriented to person, place, and time.  Psychiatric:        Mood and Affect: Mood normal.        Behavior: Behavior normal.     Labs reviewed: Basic Metabolic Panel: Recent Labs    01/31/22 2031 11/14/22 1427  NA  143 144  K 4.0 3.6  CL 107 104  CO2 29 31  GLUCOSE 105* 127  BUN 15 13  CREATININE 0.85 0.82  CALCIUM 10.0 9.7   Liver Function Tests: Recent Labs    01/31/22 2031 11/14/22 1427  AST 15 12  ALT 6 5*  ALKPHOS 53  --   BILITOT 0.7 0.5  PROT 6.7 6.2  ALBUMIN 4.3  --    No results for input(s): "LIPASE", "AMYLASE" in the last 8760 hours. No results for input(s): "AMMONIA" in the last 8760 hours. CBC: Recent Labs    01/31/22 2031 11/14/22 1427  WBC 4.8 5.8  NEUTROABS 3.2 3,608  HGB 12.6 12.9  HCT 36.7 38.0  MCV 89.5 91.6  PLT 181 249   Lipid Panel: Recent Labs    11/14/22 1427  CHOL 190  HDL 53  LDLCALC 86  TRIG 386*  CHOLHDL 3.6   TSH: No results for input(s): "TSH" in the last 8760 hours. A1C: No results found for: "HGBA1C"   Assessment/Plan 1. Orthostatic hypotension - fluctuating blood pressures> began when starting sinemet - limited exam due to chronic back pain - Sitting> 138/68, Standing> 110/68 - advised to take blood pressures TID x 2 weeks> avoid collecting readings when in pain or anxious - return in 2 weeks with readings - cont Propel drinks BID - falls prevention discussed  - consider midodrine if BP averaging SBP< 100  2. Essential hypertension - controlled  - BUN/creat 13/0.82 11/14/2022 - h/o chronic back pain  - elevated readings at night with HA - see above - cont lisinopril prn> take 10 mg if SBP> 180 in evening, recommend 5 mg if SBP > 150 - contact PCP if symptoms worsen   Total time: 28 minutes. Greater than 50% of total time spent doing patient education regarding orthostatic hypotension including falls safety, symptom/medication management.    Next appt: 12/18/2022  Hazle Nordmann, Juel Burrow  [facility] [phone]

## 2022-12-15 ENCOUNTER — Encounter: Payer: PPO | Admitting: Occupational Therapy

## 2022-12-15 ENCOUNTER — Ambulatory Visit: Payer: PPO

## 2022-12-15 NOTE — Therapy (Signed)
OUTPATIENT OCCUPATIONAL THERAPY PARKINSON'S TREATMENT NOTE  Patient Name: Ashley Neal MRN: [id number] DOB:[birth year], 77 y.o., female Today's Date: 12/17/2022  PCP: [name], NP REFERRING PROVIDER: [name], NP  END OF SESSION:  OT End of Session - 12/17/22 1535     Visit Number 2    Number of Visits 9    Date for OT Re-Evaluation 01/30/23    Authorization Type [employer]    OT Start Time 1535    OT Stop Time 1615    OT Time Calculation (min) 40 min    Activity Tolerance Patient tolerated treatment well;No increased pain    Behavior During Therapy [facility] for tasks assessed/performed;Restless            Past Medical History:  Diagnosis Date   Anxiety    Basal cell carcinoma    Depression    Hypertension    Neuromuscular disorder (HCC)    Osteoarthritis    Osteopenia    Parkinson disease    Pneumonia    Tremors of nervous system    legs   uterine ca 10/08/2021   also endometrial cancer   Past Surgical History:  Procedure Laterality Date   CARPAL TUNNEL RELEASE Bilateral 1996/1997   CYSTOSCOPY N/A 10/08/2021   Procedure: CYSTOSCOPY;  Surgeon: Carver Fila, MD;  Location: [facility];  Service: Gynecology;  Laterality: N/A;   ROBOTIC ASSISTED TOTAL HYSTERECTOMY WITH BILATERAL SALPINGO OOPHERECTOMY N/A 10/08/2021   Procedure: XI ROBOTIC ASSISTED TOTAL HYSTERECTOMY WITH BILATERAL SALPINGO OOPHORECTOMY;SENTINEL LYMPH NODE INJECTION;  Surgeon: Carver Fila, MD;  Location: [facility];  Service: Gynecology;  Laterality: N/A;   SQUAMOUS CELL CARCINOMA EXCISION Left 2017   Left jawline   TUBAL LIGATION  1980's   Patient Active Problem List   Diagnosis Date Noted   Endometrial cancer (HCC) 10/16/2021   Complex atypical endometrial hyperplasia 09/27/2021   Primary osteoarthritis of both hands 05/14/2020   Primary osteoarthritis of both feet 05/14/2020   Age-related osteoporosis without current pathological fracture 05/14/2020    Prediabetes 05/14/2020   Essential hypertension 05/14/2020   History of hyperlipidemia 05/14/2020   History of squamous cell carcinoma 05/14/2020   Parkinsonism 05/14/2020    ONSET DATE: referral date 11/14/22  REFERRING DIAG: G20.A1 (ICD-10-CM) - Parkinson's disease, unspecified whether dyskinesia present, unspecified whether manifestations fluctuate M54.16 (ICD-10-CM) - Lumbar radiculopathy  THERAPY DIAG:  Other lack of coordination  Muscle weakness (generalized)  Other disturbances of skin sensation  Other muscle spasm  Rationale for Evaluation and Treatment: Rehabilitation  PERTINENT HISTORY: PD, HTN, osteoarthritis, chronic back pain, depression "I hate Parkinson's"  Pt reports that her mother's sister and pt's brother both had PD.  Pt reports past 2 years have been awful; with onset of back pain, uterine cancer s/p hysterectomy and lymph node removal, and persistent back ache.  Pt reports that she has difficulty with managing buttons, feeding herself (especially cutting).  Pt reports she has been going to a [facility] class [facility] the senior center.    PRECAUTIONS: Fall ; WEIGHT BEARING RESTRICTIONS: No   SUBJECTIVE:   SUBJECTIVE STATEMENT: She states a long history of having hip and low back pain as well as needing several surgeries in the past year or two for cancer and other issues.  She does state being nervous/scared about having Parkinson's and "not wanting to know" how it progresses.    PAIN:  Are you having pain?  Yes: NPRS scale: 3-4/10 (chronic pain)  Pain location: pain in pelvis and  lower back pain Pain description: radiating Aggravating factors: sitting Relieving factors: medication, xanax worked (but they are not prescribing it to her anymore)  FALLS: Has patient fallen in last 6 months? Yes. 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   PATIENT GOALS: to be able to maintain typical living skills to remain in home   OBJECTIVE: (All objective assessments below are from initial evaluation on: 12/04/22 unless otherwise specified.)   HAND DOMINANCE: Right  ADLs: Transfers/ambulation related to ADLs: Independent Eating: typically will eat hand held items, difficulty with cutting foods Grooming: difficulty with styling hair UB Dressing: buttons are challenging, fastens bra in front LB Dressing: typically will lean up against something and complete in standing Toileting: Mod I, grab bar next to toilet Bathing: Mod I, has shower chair but doesn't use it Tub Shower transfers: had tub/shower remodeled to walk in shower Equipment: Shower seat with back, Grab bars, Walk in shower, Reacher, and Long handled shoe horn  IADLs: Shopping: order online, son will pick up for her Light housekeeping: reports that she can't clean as well as she used to Meal Prep: makes simple meals Community mobility: still driving Medication management: easy off caps, uses phone as reminder to take Handwriting: 90% legible, Increased time, Moderate micrographia, and PPT #1: 14.59 ([place])  MOBILITY STATUS: Hx of falls  POSTURE COMMENTS:  rounded shoulders  ACTIVITY TOLERANCE: Activity tolerance: WFL for tasks assessed on eval  FUNCTIONAL OUTCOME MEASURES: 12/17/22: Grip strength measures: Rt: 15#; Lt: 23#  Eval: Fastening/unfastening 3 buttons: 1:07.94 Physical performance test: PPT#2 (simulated eating) 13.57 sec & PPT#4 (donning/doffing jacket): 13.37sec (12/17/22)  COORDINATION: 9 Hole Peg test: Right: 1:10.91 sec; Left: 57.75 sec Box and Blocks:   Right 48 blocks, Left 46 blocks Tremors: Resting and Right  UE ROM:  WFL;  UE MMT:   WFL  SENSATION: Decreased sensation in finger tips in B hands  COGNITION: Overall cognitive status: Within functional limits for tasks assessed  OBSERVATIONS: Bradykinesia   TODAY'S TREATMENT:                                                                                                                              12/17/22: We discussed Parkinson's Disease and how it typically progresses, effecting motor skills and abilities by "slowing down," or "getting smaller."  She states "not wanting to know," but was recommended to understand the process to fight back against it by increasing her effort, amplitude, volume, etc., and practicing these skills at least 1x day for 30 mins to 1 hour. She was given resource to  use online to follow [employer] program, which she was somewhat familiar with. Additionally, she performs don/doff of simulated jacket for baseline measure as well as grip testing. Rt dom hand was significantly weak with testing today.  She was given an Statistician program for FMS and hand writing ability to attempt at home for HEP 2-3 x day for 5-10 mins each time, and was asked to try it before returning next week. It will need gone over, most likely.   In-Hand Manipulation Skills Rotation:  Hold pen, try to "twirl" like a baton, keeping flat with surface of table. Try going BOTH directions 10x  Flip:  Hold pen in writing position, flip in an arch to "erase" position, then back to "write" position. Do not lift hand off table.  10x  Translation:  Open hand palm up,  put an object in your palm and then use your fingers and thumb to move it to the tips of your fingers, pinched against your thumb.  10x (bigger is easier (fat marker), smaller is harder (penny)) Shift:  Hold pen like a dart, start "shifting" it forward until you are holding it at the base, then shift it backwards until you are  holding it at the tip again. 10x (like putting a key in a key hole)     PATIENT EDUCATION: Education details: Educated on role and purpose of OT as well as potential interventions and goals for therapy based on initial evaluation findings. Person educated: Patient Education method: Explanation Education comprehension: verbalized understanding and needs further education  HOME EXERCISE PROGRAM: See tx section above for details   GOALS: Goals reviewed with patient? Yes  SHORT TERM GOALS: Target date: 01/02/23  Pt will be independent with PD specific HEP. Baseline: Goal status: INITIAL  2.  Pt will verbalize understanding of adapted strategies to maximize safety and I with ADLs/ IADLs. Baseline:  Goal status: INITIAL  3.  Pt will demonstrate improved fine motor coordination for ADLs as evidenced by decreasing 9 hole peg test score for RUE by 5 secs Baseline: 9 Hole Peg test: Right: 1:10.91 sec; Left: 57.75 sec Goal status: INITIAL  4.  Pt will demonstrate improved grip strength as needed to mange clothing fasteners and open various household containers. Baseline: grip strength 15# Rt, 23# Lt Goal status: INITIAL   LONG TERM GOALS: Target date: 01/30/23  Pt will write a short paragraph with 100% legibility and no significant decrease in letter size Baseline: 90% legibility with mild-mod decrease in size Goal status: INITIAL  2.  Pt will demonstrate improved ease with fastening buttons as evidenced by decreasing 3 button/ unbutton time to <60 seconds. Baseline:  3 buttons: 1:07.94 Goal status: INITIAL  3.  Pt will demonstrate improved fine motor coordination for ADLs as evidenced by decreasing 9 hole peg test score for RUE by 10 sec and LUE by 5 secs Baseline: 9 Hole Peg test: Right: 1:10.91 sec; Left: 57.75 sec Goal status: INITIAL  4.  Pt will demonstrate improved ease with feeding as evidenced by decreasing PPT#2 (self feeding) by 3 secs Baseline: 13.57 sec Goal status:  INITIAL  5.  Pt will verbalize understanding of ways to prevent future PD related complications and PD community resources. Baseline:  Goal status: INITIAL  ASSESSMENT:  CLINICAL IMPRESSION: 12/17/22: Today she was a bit chatty and needed some redirection to the task and the discussion at times, but she is learning important information on understanding the disease management process.  No significant pains today  other than chronic hip and low back pain.   PLAN:  OT FREQUENCY: 1-2x/week  OT DURATION: 8 weeks  PLANNED INTERVENTIONS: self care/ADL training, therapeutic exercise, therapeutic activity, neuromuscular re-education, manual therapy, passive range of motion, balance training, functional mobility training, ultrasound, compression bandaging, moist heat, cryotherapy, contrast bath, patient/family education, psychosocial skills training, energy conservation, coping strategies training, and DME and/or AE instructions  RECOMMENDED OTHER SERVICES: NA  CONSULTED AND AGREED WITH PLAN OF CARE: Patient  PLAN FOR NEXT SESSION:  Review hand coordination activity, review the idea of large amplitude and intense upper exercises and practice these things with new HEP printed out. Help address concerns and goals   Fannie Knee, OTR/L 12/17/2022, 5:14 PM

## 2022-12-17 ENCOUNTER — Ambulatory Visit: Payer: PPO | Attending: Nurse Practitioner

## 2022-12-17 ENCOUNTER — Ambulatory Visit: Payer: PPO | Admitting: Rehabilitative and Restorative Service Providers"

## 2022-12-17 ENCOUNTER — Encounter: Payer: Self-pay | Admitting: Rehabilitative and Restorative Service Providers"

## 2022-12-17 DIAGNOSIS — M62838 Other muscle spasm: Secondary | ICD-10-CM

## 2022-12-17 DIAGNOSIS — R278 Other lack of coordination: Secondary | ICD-10-CM

## 2022-12-17 DIAGNOSIS — R208 Other disturbances of skin sensation: Secondary | ICD-10-CM

## 2022-12-17 DIAGNOSIS — R293 Abnormal posture: Secondary | ICD-10-CM | POA: Insufficient documentation

## 2022-12-17 DIAGNOSIS — M6281 Muscle weakness (generalized): Secondary | ICD-10-CM | POA: Insufficient documentation

## 2022-12-17 DIAGNOSIS — R29818 Other symptoms and signs involving the nervous system: Secondary | ICD-10-CM | POA: Diagnosis not present

## 2022-12-17 DIAGNOSIS — R279 Unspecified lack of coordination: Secondary | ICD-10-CM | POA: Insufficient documentation

## 2022-12-17 DIAGNOSIS — R2681 Unsteadiness on feet: Secondary | ICD-10-CM | POA: Insufficient documentation

## 2022-12-17 DIAGNOSIS — R269 Unspecified abnormalities of gait and mobility: Secondary | ICD-10-CM | POA: Diagnosis not present

## 2022-12-17 DIAGNOSIS — M5459 Other low back pain: Secondary | ICD-10-CM | POA: Insufficient documentation

## 2022-12-17 DIAGNOSIS — Z85828 Personal history of other malignant neoplasm of skin: Secondary | ICD-10-CM | POA: Diagnosis not present

## 2022-12-17 DIAGNOSIS — L218 Other seborrheic dermatitis: Secondary | ICD-10-CM | POA: Diagnosis not present

## 2022-12-17 NOTE — Therapy (Signed)
OUTPATIENT PHYSICAL THERAPY NEURO TREATMENT   Patient Name: Ashley Neal MRN: [id number] DOB:09-15-1945, 77 y.o., female Today's Date: 12/17/2022   PCP: [name], NP REFERRING PROVIDER: [name], NP  END OF SESSION:  PT End of Session - 12/17/22 1530     Visit Number 2    Number of Visits 6    Date for PT Re-Evaluation 01/15/23    Authorization Type [employer]    PT Start Time 1615    PT Stop Time 1700    PT Time Calculation (min) 45 min             Past Medical History:  Diagnosis Date   Anxiety    Basal cell carcinoma    Depression    Hypertension    Neuromuscular disorder (HCC)    Osteoarthritis    Osteopenia    Parkinson disease    Pneumonia    Tremors of nervous system    legs   uterine ca 10/08/2021   also endometrial cancer   Past Surgical History:  Procedure Laterality Date   CARPAL TUNNEL RELEASE Bilateral 1996/1997   CYSTOSCOPY N/A 10/08/2021   Procedure: CYSTOSCOPY;  Surgeon: Carver Fila, MD;  Location: [facility];  Service: Gynecology;  Laterality: N/A;   ROBOTIC ASSISTED TOTAL HYSTERECTOMY WITH BILATERAL SALPINGO OOPHERECTOMY N/A 10/08/2021   Procedure: XI ROBOTIC ASSISTED TOTAL HYSTERECTOMY WITH BILATERAL SALPINGO OOPHORECTOMY;SENTINEL LYMPH NODE INJECTION;  Surgeon: Carver Fila, MD;  Location: [facility];  Service: Gynecology;  Laterality: N/A;   SQUAMOUS CELL CARCINOMA EXCISION Left 2017   Left jawline   TUBAL LIGATION  1980's   Patient Active Problem List   Diagnosis Date Noted   Endometrial cancer (HCC) 10/16/2021   Complex atypical endometrial hyperplasia 09/27/2021   Primary osteoarthritis of both hands 05/14/2020   Primary osteoarthritis of both feet 05/14/2020   Age-related osteoporosis without current pathological fracture 05/14/2020   Prediabetes 05/14/2020   Essential hypertension 05/14/2020   History of hyperlipidemia 05/14/2020   History of squamous cell carcinoma 05/14/2020    Parkinsonism 05/14/2020    ONSET DATE: several years  REFERRING DIAG: G20.A1 (ICD-10-CM) - Parkinson's disease, unspecified whether dyskinesia present, unspecified whether manifestations fluctuate M54.16 (ICD-10-CM) - Lumbar radiculopathy  THERAPY DIAG:  Abnormality of gait and mobility  Other low back pain  Unsteadiness on feet  Other symptoms and signs involving the nervous system  Muscle weakness (generalized)  Rationale for Evaluation and Treatment: Rehabilitation  SUBJECTIVE:  SUBJECTIVE STATEMENT:  Pt accompanied by: self  PERTINENT HISTORY: She sees Dr Ethelene Hal due to chronic back pain. Recently had steroid injection  PAIN:  Are you having pain? Yes: NPRS scale: varies/10 Pain location: centered around coccyx left side Pain description: heat on surface of area of pain, burning/searing pain Aggravating factors: sitting, prolonged walking, prolonged standing   Relieving factors: using horseshoe cushion, walking  PRECAUTIONS: None  WEIGHT BEARING RESTRICTIONS: No  FALLS: Has patient fallen in last 6 months? Yes. Number of falls 3--reports these were the syncopal episodes  LIVING ENVIRONMENT: Lives with: lives with their family and lives with their son Lives in: House/apartment Stairs: 1 STE Has following equipment at home: shower chair and Grab bars  PLOF: Independent with basic ADLs, Independent with household mobility without device, and Independent with community mobility without device  PATIENT GOALS: decr back pain and improve mobility  OBJECTIVE:   TODAY'S TREATMENT: 12/17/22 Activity Comments  SLS at counter 1x45 sec, no incr in lat hip pain  Tandem walk Along counter  Walking march with opposite knee tap   Sit to stand with resistance loops around knees 3x10   Child's  pose 2x60 sec   Pt education Local PD classes/resources  Static balance Standing on foam    PATIENT EDUCATION: Education details: assessment findings Person educated: Patient Education method: Explanation Education comprehension: verbalized understanding  HOME EXERCISE PROGRAM: Access Code: [email or web address] URL: [email or web address] Date: 12/17/2022 Prepared by: Shary Decamp  Exercises - Sit to Stand with Resistance Around Legs  - 1 x daily - 7 x weekly - 3 sets - 10 reps  DIAGNOSTIC FINDINGS:  MRI Pelvis IMPRESSION: 1. No acute findings or clear explanation for the patient's symptoms. 2. Although incompletely visualized, grossly stable partial tear of the left common hamstring tendon. Mild gluteus tendinosis bilaterally. 3. No evidence of sacral fracture or metastatic disease. Mild sacroiliac degenerative changes bilaterally. IMPRESSION: 1. Mild left foraminal narrowing at L3-4 and L5-S1. 2. Mild foraminal narrowing bilaterally at L4-5. 3. Slight anterolisthesis at L3-4 and L4-5.  COGNITION: Overall cognitive status: Within functional limits for tasks assessed   SENSATION:   COORDINATION: Difficulty with rapid alternating  EDEMA:  none  MUSCLE TONE: WNL  MUSCLE LENGTH: DNT     POSTURE: flexed trunk   LOWER EXTREMITY ROM:     WNL  LOWER EXTREMITY MMT:    Grossly 4/5 BLE    BED MOBILITY:  indep  TRANSFERS: Assistive device utilized: None  Sit to stand: Complete Independence Stand to sit: Complete Independence Chair to chair: Complete Independence Floor:  NT   CURB:  Level of Assistance: Complete Independence Assistive device utilized: None Curb Comments:   STAIRS: Level of Assistance: Modified independence Stair Negotiation Technique: Alternating Pattern  with Single Rail on Right Number of Stairs:   Height of Stairs:   Comments:   GAIT: Gait pattern: WFL Distance walked:  Assistive device utilized: None Level of  assistance: Complete Independence Comments:   FUNCTIONAL TESTS:  5 times sit to stand: 7 sec Timed up and go (TUG): 6.85 Mini-BesTest:21/28  TUG test: 6.85 sec  M-CTSIB  Condition 1: Firm Surface, EO 30 Sec, Normal Sway  Condition 2: Firm Surface, EC 30 Sec, Normal Sway  Condition 3: Foam Surface, EO 30 Sec, Mild Sway  Condition 4: Foam Surface, EC 30 Sec, Moderate Sway           GOALS: Goals reviewed with patient? Yes  SHORT TERM GOALS: Target date: 12/25/2022    Patient will  be independent in HEP to improve functional outcomes Baseline: Goal status: INITIAL  2. Demo improved balance/postural control as evidenced by normal-mild sway Condition 4 M-CTSIB  Baseline: Moderate  Goal status: INITIAL   LONG TERM GOALS: Target date: 01/15/2023    Demo improved balance and postural control per score 24/28 MiniBESTest Baseline: 21/28 Goal status: INITIAL  2.  Pt to demo independent HEP for balance and lumbar issues Baseline:  Goal status: INITIAL    ASSESSMENT:  CLINICAL IMPRESSION: Initiated treatment with activities to improve single limb stance followed by alternating coordination.  Strength training to increase load on hip abductors as pt notes MRI report reveals gluteal tendinopathy.  Demonstrated good performance throughout and mild sway with balance on foam activities.  Provided reference list for local PD-specific classes and exercise groups.   OBJECTIVE IMPAIRMENTS: decreased activity tolerance, decreased balance, decreased coordination, decreased endurance, decreased knowledge of use of DME, decreased strength, improper body mechanics, postural dysfunction, and pain.   ACTIVITY LIMITATIONS: carrying, sitting, standing, and locomotion level  PARTICIPATION LIMITATIONS: shopping and community activity  PERSONAL FACTORS: Age, Time since onset of injury/illness/exacerbation, and 1 comorbidity: PD and LBP  are also affecting patient's functional outcome.   REHAB  POTENTIAL: Good  CLINICAL DECISION MAKING: Stable/uncomplicated  EVALUATION COMPLEXITY: Low  PLAN:  PT FREQUENCY: 1x/week  PT DURATION: 6 weeks  PLANNED INTERVENTIONS: Therapeutic exercises, Therapeutic activity, Neuromuscular re-education, Balance training, Gait training, Patient/Family education, Self Care, Joint mobilization, Joint manipulation, Stair training, Vestibular training, Canalith repositioning, Orthotic/Fit training, DME instructions, Aquatic Therapy, Dry Needling, Electrical stimulation, Cryotherapy, Moist heat, Traction, Ultrasound, Ionotophoresis 4mg /ml Dexamethasone, and Manual therapy  PLAN FOR NEXT SESSION: list of PD-specific community activities, more lumbar specific assessment   3:31 PM, 12/17/22 M. Shary Decamp, PT, DPT Physical Therapist- [place] Office Number: [phone]

## 2022-12-18 ENCOUNTER — Ambulatory Visit (INDEPENDENT_AMBULATORY_CARE_PROVIDER_SITE_OTHER): Payer: PPO | Admitting: Orthopedic Surgery

## 2022-12-18 ENCOUNTER — Encounter: Payer: Self-pay | Admitting: Orthopedic Surgery

## 2022-12-18 VITALS — BP 138/80 | HR 87 | Temp 97.1°F | Resp 16 | Ht 61.22 in | Wt 147.0 lb

## 2022-12-18 DIAGNOSIS — I951 Orthostatic hypotension: Secondary | ICD-10-CM

## 2022-12-18 DIAGNOSIS — I10 Essential (primary) hypertension: Secondary | ICD-10-CM | POA: Diagnosis not present

## 2022-12-18 DIAGNOSIS — G20A1 Parkinson's disease without dyskinesia, without mention of fluctuations: Secondary | ICD-10-CM

## 2022-12-18 NOTE — Progress Notes (Signed)
Careteam: Patient Care Team: [name], NP as PCP - General (Geriatric Medicine) Corky Crafts, MD as PCP - Cardiology (Cardiology)  Seen by: Hazle Nordmann, AGNP-C  PLACE OF SERVICE:  [facility]  Advanced Directive information Does Patient Have a Medical Advance Directive?: Yes, Type of Advance Directive: Healthcare Power of [place];Living will, Does patient want to make changes to medical advance directive?: No - Patient declined  Allergies  Allergen Reactions   Codeine Other (See Comments)    Nausea     Chief Complaint  Patient presents with   Follow-up    2 week follow up on blood pressure.      HPI: Patient is a 77 y.o. female seen today for acute visit regarding ongoing abnormal blood pressures.   "She reports fluctuating blood pressure since starting carbidopa. She reports taking blood pressure three times daily. She did not bring readings with her today. She will also stand and have lower pressures at times> sometimes dizziness occurs. At rest, blood pressures will be high, especially in evening. She will also experience headaches. She does have lisinopril prn for elevated blood pressures. She is only using lisinopril 5 mg qhs prn about 1-2x/ weekly. She has been advised to consume electrolyte drink daily. She has been drinking 2 Propel daily. "   Today, she has been taking blood pressure three times daily as advised. She brought readings with her today. Lowest reading was 96/63. Highest blood pressures were in the evening. Most averaging > 150/90. No recent falls or syncope. She still reports orthostatic pressures.   Review of Systems:  Review of Systems  Constitutional:  Negative for chills and fever.  Eyes:  Negative for blurred vision.  Respiratory:  Negative for cough, shortness of breath and wheezing.   Cardiovascular:  Negative for chest pain and leg swelling.  Musculoskeletal:  Negative for falls.  Neurological:  Positive for dizziness and  tremors. Negative for headaches.  Psychiatric/Behavioral:  Positive for depression. The patient is not nervous/anxious.     Past Medical History:  Diagnosis Date   Anxiety    Basal cell carcinoma    Depression    Hypertension    Neuromuscular disorder (HCC)    Osteoarthritis    Osteopenia    Parkinson disease    Pneumonia    Tremors of nervous system    legs   uterine ca 10/08/2021   also endometrial cancer   Past Surgical History:  Procedure Laterality Date   CARPAL TUNNEL RELEASE Bilateral 1996/1997   CYSTOSCOPY N/A 10/08/2021   Procedure: CYSTOSCOPY;  Surgeon: Carver Fila, MD;  Location: [facility];  Service: Gynecology;  Laterality: N/A;   ROBOTIC ASSISTED TOTAL HYSTERECTOMY WITH BILATERAL SALPINGO OOPHERECTOMY N/A 10/08/2021   Procedure: XI ROBOTIC ASSISTED TOTAL HYSTERECTOMY WITH BILATERAL SALPINGO OOPHORECTOMY;SENTINEL LYMPH NODE INJECTION;  Surgeon: Carver Fila, MD;  Location: [facility];  Service: Gynecology;  Laterality: N/A;   SQUAMOUS CELL CARCINOMA EXCISION Left 2017   Left jawline   TUBAL LIGATION  1980's   Social History:   reports that she has never smoked. She has never been exposed to tobacco smoke. She has never used smokeless tobacco. She reports current alcohol use. She reports that she does not currently use drugs.  Family History  Problem Relation Age of Onset   Endometrial cancer Mother    Diabetes Mother    Alzheimer's disease Father    Coronary artery disease Father    Diabetes Sister    COPD Sister  Diabetes Sister    Heart disease Brother    Other Brother        MVA    Parkinson's disease Brother    Heart disease Brother    Alcohol abuse Brother    Heart disease Brother    Parkinson's disease Maternal Aunt    Breast cancer Maternal Aunt    Colon cancer Paternal Aunt    Parkinson's disease Paternal Aunt    Lung cancer Paternal Uncle    Healthy Son    Healthy Son    Prostate cancer Neg Hx    Pancreatic cancer Neg Hx     Ovarian cancer Neg Hx     Medications: Patient's Medications  New Prescriptions   No medications on file  Previous Medications   ACETAMINOPHEN (TYLENOL PO)    Take 1 tablet by mouth as needed.   BISACODYL (DULCOLAX) 5 MG EC TABLET    Take by mouth.   CARBIDOPA-LEVODOPA (SINEMET IR) 25-100 MG TABLET    Take 1.5 tablets by mouth 5 (five) times daily.   ESCITALOPRAM (LEXAPRO) 5 MG TABLET    Take 5 mg by mouth daily.   MULTIPLE VITAMIN (MULTIVITAMIN) TABLET    Take 1 tablet by mouth daily.   POLYETHYLENE GLYCOL POWDER (GLYCOLAX/MIRALAX) 17 GM/SCOOP POWDER    Take 17 g by mouth as needed.  Modified Medications   No medications on file  Discontinued Medications   No medications on file    Physical Exam:  Vitals:   12/18/22 1422  BP: 138/80  Pulse: 87  Resp: 16  Temp: (!) 97.1 F (36.2 C)  SpO2: 96%  Weight: 147 lb (66.7 kg)  Height: 5' 1.22" (1.555 m)   Body mass index is 27.58 kg/m. Wt Readings from Last 3 Encounters:  12/18/22 147 lb (66.7 kg)  12/04/22 147 lb 3.2 oz (66.8 kg)  11/14/22 145 lb 12.8 oz (66.1 kg)    Physical Exam Vitals reviewed.  Constitutional:      General: She is not in acute distress. HENT:     Head: Normocephalic.  Eyes:     General:        Right eye: No discharge.        Left eye: No discharge.  Cardiovascular:     Rate and Rhythm: Normal rate and regular rhythm.     Pulses: Normal pulses.     Heart sounds: Normal heart sounds.  Pulmonary:     Effort: Pulmonary effort is normal. No respiratory distress.     Breath sounds: Normal breath sounds. No wheezing.  Abdominal:     General: Bowel sounds are normal. There is no distension.     Palpations: Abdomen is soft.     Tenderness: There is no abdominal tenderness.  Musculoskeletal:     Cervical back: Neck supple.     Right lower leg: No edema.     Left lower leg: No edema.  Skin:    General: Skin is warm.     Capillary Refill: Capillary refill takes less than 2 seconds.  Neurological:      General: No focal deficit present.     Mental Status: She is alert and oriented to person, place, and time.     Comments: Resting right hand tremor  Psychiatric:        Mood and Affect: Mood normal.     Labs reviewed: Basic Metabolic Panel: Recent Labs    01/31/22 2031 11/14/22 1427  NA 143 144  K 4.0 3.6  CL  107 104  CO2 29 31  GLUCOSE 105* 127  BUN 15 13  CREATININE 0.85 0.82  CALCIUM 10.0 9.7   Liver Function Tests: Recent Labs    01/31/22 2031 11/14/22 1427  AST 15 12  ALT 6 5*  ALKPHOS 53  --   BILITOT 0.7 0.5  PROT 6.7 6.2  ALBUMIN 4.3  --    No results for input(s): "LIPASE", "AMYLASE" in the last 8760 hours. No results for input(s): "AMMONIA" in the last 8760 hours. CBC: Recent Labs    01/31/22 2031 11/14/22 1427  WBC 4.8 5.8  NEUTROABS 3.2 3,608  HGB 12.6 12.9  HCT 36.7 38.0  MCV 89.5 91.6  PLT 181 249   Lipid Panel: Recent Labs    11/14/22 1427  CHOL 190  HDL 53  LDLCALC 86  TRIG 386*  CHOLHDL 3.6   TSH: No results for input(s): "TSH" in the last 8760 hours. A1C: No results found for: "HGBA1C"   Assessment/Plan 1. Essential hypertension - elevated in evening, > 150/90 - recommend checking pressure qhs  - if bp > 150/90- take lisinopril 10 mg qhs prn  2. Orthostatic hypotension - ongoing - mild dizziness - blood pressures not low - lowest 96/63 once in last 2 weeks - associated with carbidopa use - recommend good hydration and electrolyte drink daily and before exercising - consider compression stockings  Total time: 18 minutes. Greater than 50% of total time spent doing patient education regarding hypertension and orthostatic hypotension including symptom/medication management.    Next appt: none  Korben Carcione Scherry Ran  [facility] [phone]

## 2022-12-18 NOTE — Patient Instructions (Addendum)
Recommend checking blood pressure every night> if blood pressure > 150/90- take full dose of lisinopril   Recommend compression stockings> does not need to be medical grade> look on [employer]  Recommend electrolyte drink prior to exercise  .

## 2022-12-23 NOTE — Therapy (Signed)
OUTPATIENT OCCUPATIONAL THERAPY PARKINSON'S TREATMENT NOTE  Patient Name: Ashley Neal MRN: [id number] DOB:07-05-1946, 77 y.o., female Today's Date: 12/24/2022  PCP: [name], NP REFERRING PROVIDER: [name], NP  END OF SESSION:  OT End of Session - 12/24/22 1533     Visit Number 3    Number of Visits 9    Date for OT Re-Evaluation 01/30/23    Authorization Type [employer]    OT Start Time 1533    OT Stop Time 1618    OT Time Calculation (min) 45 min    Activity Tolerance Patient tolerated treatment well;No increased pain;Patient limited by pain    Behavior During Therapy [facility] for tasks assessed/performed;Restless             Past Medical History:  Diagnosis Date   Anxiety    Basal cell carcinoma    Depression    Hypertension    Neuromuscular disorder (HCC)    Osteoarthritis    Osteopenia    Parkinson disease    Pneumonia    Tremors of nervous system    legs   uterine ca 10/08/2021   also endometrial cancer   Past Surgical History:  Procedure Laterality Date   CARPAL TUNNEL RELEASE Bilateral 1996/1997   CYSTOSCOPY N/A 10/08/2021   Procedure: CYSTOSCOPY;  Surgeon: Carver Fila, MD;  Location: [facility];  Service: Gynecology;  Laterality: N/A;   ROBOTIC ASSISTED TOTAL HYSTERECTOMY WITH BILATERAL SALPINGO OOPHERECTOMY N/A 10/08/2021   Procedure: XI ROBOTIC ASSISTED TOTAL HYSTERECTOMY WITH BILATERAL SALPINGO OOPHORECTOMY;SENTINEL LYMPH NODE INJECTION;  Surgeon: Carver Fila, MD;  Location: [facility];  Service: Gynecology;  Laterality: N/A;   SQUAMOUS CELL CARCINOMA EXCISION Left 2017   Left jawline   TUBAL LIGATION  1980's   Patient Active Problem List   Diagnosis Date Noted   Endometrial cancer (HCC) 10/16/2021   Complex atypical endometrial hyperplasia 09/27/2021   Primary osteoarthritis of both hands 05/14/2020   Primary osteoarthritis of both feet 05/14/2020   Age-related osteoporosis without current pathological  fracture 05/14/2020   Prediabetes 05/14/2020   Essential hypertension 05/14/2020   History of hyperlipidemia 05/14/2020   History of squamous cell carcinoma 05/14/2020   Parkinsonism 05/14/2020    ONSET DATE: referral date 11/14/22  REFERRING DIAG: G20.A1 (ICD-10-CM) - Parkinson's disease, unspecified whether dyskinesia present, unspecified whether manifestations fluctuate M54.16 (ICD-10-CM) - Lumbar radiculopathy  THERAPY DIAG:  Other lack of coordination  Muscle weakness (generalized)  Other disturbances of skin sensation  Other muscle spasm  Unsteadiness on feet  Rationale for Evaluation and Treatment: Rehabilitation  PERTINENT HISTORY: PD, HTN, osteoarthritis, chronic back pain, depression "I hate Parkinson's"  Pt reports that her mother's sister and pt's brother both had PD.  Pt reports past 2 years have been awful; with onset of back pain, uterine cancer s/p hysterectomy and lymph node removal, and persistent back ache.  Pt reports that she has difficulty with managing buttons, feeding herself (especially cutting).  Pt reports she has been going to a balance class at the senior center.    PRECAUTIONS: Fall ; WEIGHT BEARING RESTRICTIONS: No   SUBJECTIVE:   SUBJECTIVE STATEMENT: She states her low back is still the most limiting factor she has, and her Rt shoulder has been tight and hurting- does have hx of RTC tears. She did look up LSVT on the internet and has been doing it at times on her own to videos.    PAIN:  Are you having pain?  Yes: NPRS scale:  3-4/10 (chronic pain)  Pain location: pain in pelvis and lower back pain Pain description: radiating Aggravating factors: sitting Relieving factors: medication, xanax worked (but they are not prescribing it to her anymore)  FALLS: Has patient fallen in last 6 months? Yes. 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   PATIENT GOALS: to be able to maintain typical living skills to remain in home   OBJECTIVE: (All objective assessments below are from initial evaluation on: 12/04/22 unless otherwise specified.)   HAND DOMINANCE: Right  ADLs: Transfers/ambulation related to ADLs: Independent Eating: typically will eat hand held items, difficulty with cutting foods Grooming: difficulty with styling hair UB Dressing: buttons are challenging, fastens bra in front LB Dressing: typically will lean up against something and complete in standing Toileting: Mod I, grab bar next to toilet Bathing: Mod I, has shower chair but doesn't use it Tub Shower transfers: had tub/shower remodeled to walk in shower Equipment: Shower seat with back, Grab bars, Walk in shower, Reacher, and Long handled shoe horn  IADLs: Shopping: order online, son will pick up for her Light housekeeping: reports that she can't clean as well as she used to Meal Prep: makes simple meals Community mobility: still driving Medication management: easy off caps, uses phone as reminder to take Handwriting: 90% legible, Increased time, Moderate micrographia, and PPT #1: 14.59 ([place])  MOBILITY STATUS: Hx of falls  POSTURE COMMENTS:  rounded shoulders  ACTIVITY TOLERANCE: Activity tolerance: WFL for tasks assessed on eval  FUNCTIONAL OUTCOME MEASURES: 12/17/22: Grip strength measures: Rt: 15#; Lt: 23#  Eval: Fastening/unfastening 3 buttons: 1:07.94 Physical performance test: PPT#2 (simulated eating) 13.57 sec & PPT#4 (donning/doffing jacket): 13.37sec (12/17/22)  COORDINATION: 9 Hole Peg test: Right: 1:10.91  sec; Left: 57.75 sec Box and Blocks:  Right 48 blocks, Left 46 blocks Tremors: Resting and Right  UE ROM:   12/24/22: Rt Sh:   Flexion:  100*, Abd: 105*, ER: 46* tender, IR 44*   UE MMT:   WFL  SENSATION: Decreased sensation in finger tips in B hands  COGNITION: Overall cognitive status: Within functional limits for tasks assessed  OBSERVATIONS: Bradykinesia   TODAY'S TREATMENT:                                                                                                                              12/24/22: She performs AROM of Rt shoulder for detailed measures showing some pain, weakness.  OT gives out HEP as below to help deal with this as well as hip and low back stretches to improve pain and also LBD  skills.  She states seated, reaching down legs to toes makes her back stretch in a good way and her back "feels the best it has all day," after done. OT performs each of these with her, her performing back with cues and supervision. No added pain to Rt shoulder, feels a bit looser at end.   Exercises - Seated Scapular Retraction  - 4 x daily - 5-10 reps - Seated Shoulder Flexion Towel Slide at Table Top  - 4 x daily - 3-5 reps - 15 hold - Seated Shoulder Abduction Towel Slide at Table Top  - 3-4 x daily - 3-5 reps - 15 hold - Seated Shoulder External Rotation PROM on Table  - 3-4 x daily - 3-5 reps - 15 sec hold - Standing neck/upper traps stretch  - 4-6 x daily - 3-5 reps - 15 sec hold   12/17/22: We discussed Parkinson's Disease and how it typically progresses, effecting motor skills and abilities by "slowing down," or "getting smaller."  She states "not wanting to know," but was recommended to understand the process to fight back against it by increasing her effort, amplitude, volume, etc., and practicing these skills at least 1x day for 30 mins to 1 hour. She was given resource to use online to follow LVST BIG program, which she was somewhat familiar with. Additionally, she performs  don/doff of simulated jacket for baseline measure as well as grip testing. Rt dom hand was significantly weak with testing today.  She was given an Statistician program for FMS and hand writing ability to attempt at home for HEP 2-3 x day for 5-10 mins each time, and was asked to try it before returning next week. It will need gone over, most likely.   In-Hand Manipulation Skills Rotation:  Hold pen, try to "twirl" like a baton, keeping flat with surface of table. Try going BOTH directions 10x  Flip:  Hold pen in writing position, flip in an arch to "erase" position, then back to "write" position. Do not lift hand off table.  10x  Translation:  Open hand palm up,  put an object in your palm and then use your fingers and thumb to move it to the tips of your fingers, pinched against your thumb.  10x (bigger is easier (fat marker), smaller is harder (penny)) Shift:  Hold pen like a dart, start "shifting" it forward until you are holding it at the base, then shift it backwards until you are holding it at the tip again. 10x (like putting a key in a key hole)     PATIENT EDUCATION: Education details: Educated on role and purpose of OT as well as potential interventions and goals for therapy based on initial evaluation findings. Person educated: Patient Education method: Explanation Education comprehension: verbalized understanding and needs further education  HOME EXERCISE PROGRAM: Access Code: [email or web address] URL: [email or web address] Date: 12/24/2022 Prepared by: Fannie Knee   GOALS: Goals reviewed with patient? Yes  SHORT TERM GOALS: Target date: 01/02/23  Pt will be independent with PD specific HEP. Baseline: Goal status: INITIAL  2.  Pt will verbalize understanding of adapted strategies to maximize safety and I with ADLs/ IADLs. Baseline:  Goal status: INITIAL  3.  Pt will demonstrate improved fine motor coordination for ADLs as evidenced by decreasing 9 hole  peg test score for RUE by 5 secs Baseline: 9 Hole Peg test: Right: 1:10.91 sec; Left: 57.75 sec Goal status: INITIAL  4.  Pt will demonstrate improved grip strength as needed  to mange clothing fasteners and open various household containers. Baseline: grip strength 15# Rt, 23# Lt Goal status: INITIAL   LONG TERM GOALS: Target date: 01/30/23  Pt will write a short paragraph with 100% legibility and no significant decrease in letter size Baseline: 90% legibility with mild-mod decrease in size Goal status: INITIAL  2.  Pt will demonstrate improved ease with fastening buttons as evidenced by decreasing 3 button/ unbutton time to <60 seconds. Baseline:  3 buttons: 1:07.94 Goal status: INITIAL  3.  Pt will demonstrate improved fine motor coordination for ADLs as evidenced by decreasing 9 hole peg test score for RUE by 10 sec and LUE by 5 secs Baseline: 9 Hole Peg test: Right: 1:10.91 sec; Left: 57.75 sec Goal status: INITIAL  4.  Pt will demonstrate improved ease with feeding as evidenced by decreasing PPT#2 (self feeding) by 3 secs Baseline: 13.57 sec Goal status: INITIAL  5.  Pt will verbalize understanding of ways to prevent future PD related complications and PD community resources. Baseline:  Goal status: INITIAL  ASSESSMENT:  CLINICAL IMPRESSION: 12/24/22: She is learning ways to manage pain and stiffness in her body that is exacerbated by injuries and PD, no doubt.  It's great that she's trying LVST online with videos, and she was encouraged to keep that up every day, as much as possible.    PLAN:  OT FREQUENCY: 1-2x/week  OT DURATION: 8 weeks  PLANNED INTERVENTIONS: self care/ADL training, therapeutic exercise, therapeutic activity, neuromuscular re-education, manual therapy, passive range of motion, balance training, functional mobility training, ultrasound, compression bandaging, moist heat, cryotherapy, contrast bath, patient/family education, psychosocial skills  training, energy conservation, coping strategies training, and DME and/or AE instructions  CONSULTED AND AGREED WITH PLAN OF CARE: Patient  PLAN FOR NEXT SESSION:  Review new sh stretches HEP and given more hand related activities (can review "pen tricks" or give more gross grasp/strength activities/putty etc.  (Does have significantly weak grip)    Fannie Knee, OTR/L 12/24/2022, 5:24 PM

## 2022-12-24 ENCOUNTER — Ambulatory Visit: Payer: PPO | Admitting: Rehabilitative and Restorative Service Providers"

## 2022-12-24 ENCOUNTER — Ambulatory Visit: Payer: PPO

## 2022-12-24 ENCOUNTER — Encounter: Payer: Self-pay | Admitting: Rehabilitative and Restorative Service Providers"

## 2022-12-24 DIAGNOSIS — R269 Unspecified abnormalities of gait and mobility: Secondary | ICD-10-CM

## 2022-12-24 DIAGNOSIS — M6281 Muscle weakness (generalized): Secondary | ICD-10-CM

## 2022-12-24 DIAGNOSIS — R278 Other lack of coordination: Secondary | ICD-10-CM

## 2022-12-24 DIAGNOSIS — R2681 Unsteadiness on feet: Secondary | ICD-10-CM

## 2022-12-24 DIAGNOSIS — R293 Abnormal posture: Secondary | ICD-10-CM

## 2022-12-24 DIAGNOSIS — R208 Other disturbances of skin sensation: Secondary | ICD-10-CM

## 2022-12-24 DIAGNOSIS — M62838 Other muscle spasm: Secondary | ICD-10-CM

## 2022-12-24 NOTE — Therapy (Signed)
OUTPATIENT PHYSICAL THERAPY NEURO TREATMENT   Patient Name: Ashley Neal MRN: [id number] DOB:[birth year], 77 y.o., female Today's Date: 12/24/2022   PCP: [name], NP REFERRING PROVIDER: [name], NP  END OF SESSION:  PT End of Session - 12/24/22 1618     Visit Number 3    Number of Visits 6    Date for PT Re-Evaluation 01/15/23    Authorization Type [employer]    PT Start Time 1615    PT Stop Time 1700    PT Time Calculation (min) 45 min             Past Medical History:  Diagnosis Date   Anxiety    Basal cell carcinoma    Depression    Hypertension    Neuromuscular disorder (HCC)    Osteoarthritis    Osteopenia    Parkinson disease    Pneumonia    Tremors of nervous system    legs   uterine ca 10/08/2021   also endometrial cancer   Past Surgical History:  Procedure Laterality Date   CARPAL TUNNEL RELEASE Bilateral 1996/1997   CYSTOSCOPY N/A 10/08/2021   Procedure: CYSTOSCOPY;  Surgeon: Carver Fila, MD;  Location: [facility];  Service: Gynecology;  Laterality: N/A;   ROBOTIC ASSISTED TOTAL HYSTERECTOMY WITH BILATERAL SALPINGO OOPHERECTOMY N/A 10/08/2021   Procedure: XI ROBOTIC ASSISTED TOTAL HYSTERECTOMY WITH BILATERAL SALPINGO OOPHORECTOMY;SENTINEL LYMPH NODE INJECTION;  Surgeon: Carver Fila, MD;  Location: [facility];  Service: Gynecology;  Laterality: N/A;   SQUAMOUS CELL CARCINOMA EXCISION Left 2017   Left jawline   TUBAL LIGATION  1980's   Patient Active Problem List   Diagnosis Date Noted   Endometrial cancer (HCC) 10/16/2021   Complex atypical endometrial hyperplasia 09/27/2021   Primary osteoarthritis of both hands 05/14/2020   Primary osteoarthritis of both feet 05/14/2020   Age-related osteoporosis without current pathological fracture 05/14/2020   Prediabetes 05/14/2020   Essential hypertension 05/14/2020   History of hyperlipidemia 05/14/2020   History of squamous cell carcinoma 05/14/2020    Parkinsonism 05/14/2020    ONSET DATE: several years  REFERRING DIAG: G20.A1 (ICD-10-CM) - Parkinson's disease, unspecified whether dyskinesia present, unspecified whether manifestations fluctuate M54.16 (ICD-10-CM) - Lumbar radiculopathy  THERAPY DIAG:  Muscle weakness (generalized)  Unsteadiness on feet  Abnormality of gait and mobility  Abnormal posture  Rationale for Evaluation and Treatment: Rehabilitation  SUBJECTIVE:  SUBJECTIVE STATEMENT: Nothing new to report.  Pt accompanied by: self  PERTINENT HISTORY: She sees Dr Ethelene Hal due to chronic back pain. Recently had steroid injection  PAIN:  Are you having pain? Yes: NPRS scale: varies/10 Pain location: centered around coccyx left side Pain description: heat on surface of area of pain, burning/searing pain Aggravating factors: sitting, prolonged walking, prolonged standing   Relieving factors: using horseshoe cushion, walking  PRECAUTIONS: None  WEIGHT BEARING RESTRICTIONS: No  FALLS: Has patient fallen in last 6 months? Yes. Number of falls 3--reports these were the syncopal episodes  LIVING ENVIRONMENT: Lives with: lives with their family and lives with their son Lives in: House/apartment Stairs: 1 STE Has following equipment at home: shower chair and Grab bars  PLOF: Independent with basic ADLs, Independent with household mobility without device, and Independent with community mobility without device  PATIENT GOALS: decr back pain and improve mobility  OBJECTIVE:   TODAY'S TREATMENT: 12/24/22 Activity Comments  NU-step x 8 min speed intervals 30 sec fast; 60 sec slow  retrowalking   Push-release   Hip bumps   Gait training -large arm swing -walking with sharp turns -forward/reverse -fast alt stair taps  Foot on step x  15 sec -EO/EC x 15 sec -head turns 3x        PATIENT EDUCATION: Education details: assessment findings Person educated: Patient Education method: Explanation Education comprehension: verbalized understanding  HOME EXERCISE PROGRAM: Access Code: [email or web address] URL: [email or web address] Date: 12/17/2022 Prepared by: Shary Decamp  Exercises - Sit to Stand with Resistance Around Legs  - 1 x daily - 7 x weekly - 3 sets - 10 reps - Standing Foot Tap on Box (BKA)  - 1 x daily - 7 x weekly - 3 sets - 15 sec hold -Hip BUMPS (need to find picture)  DIAGNOSTIC FINDINGS:  MRI Pelvis IMPRESSION: 1. No acute findings or clear explanation for the patient's symptoms. 2. Although incompletely visualized, grossly stable partial tear of the left common hamstring tendon. Mild gluteus tendinosis bilaterally. 3. No evidence of sacral fracture or metastatic disease. Mild sacroiliac degenerative changes bilaterally. IMPRESSION: 1. Mild left foraminal narrowing at L3-4 and L5-S1. 2. Mild foraminal narrowing bilaterally at L4-5. 3. Slight anterolisthesis at L3-4 and L4-5.  COGNITION: Overall cognitive status: Within functional limits for tasks assessed   SENSATION:   COORDINATION: Difficulty with rapid alternating  EDEMA:  none  MUSCLE TONE: WNL  MUSCLE LENGTH: DNT     POSTURE: flexed trunk   LOWER EXTREMITY ROM:     WNL  LOWER EXTREMITY MMT:    Grossly 4/5 BLE    BED MOBILITY:  indep  TRANSFERS: Assistive device utilized: None  Sit to stand: Complete Independence Stand to sit: Complete Independence Chair to chair: Complete Independence Floor:  NT   CURB:  Level of Assistance: Complete Independence Assistive device utilized: None Curb Comments:   STAIRS: Level of Assistance: Modified independence Stair Negotiation Technique: Alternating Pattern  with Single Rail on Right Number of Stairs:   Height of Stairs:   Comments:   GAIT: Gait pattern:  WFL Distance walked:  Assistive device utilized: None Level of assistance: Complete Independence Comments:   FUNCTIONAL TESTS:  5 times sit to stand: 7 sec Timed up and go (TUG): 6.85 Mini-BesTest:21/28  TUG test: 6.85 sec  M-CTSIB  Condition 1: Firm Surface, EO 30 Sec, Normal Sway  Condition 2: Firm Surface, EC 30 Sec, Normal Sway  Condition 3: Foam Surface, EO 30 Sec, Mild Sway  Condition 4:  Foam Surface, EC 30 Sec, Moderate Sway           GOALS: Goals reviewed with patient? Yes  SHORT TERM GOALS: Target date: 12/25/2022    Patient will be independent in HEP to improve functional outcomes Baseline: Goal status: IN PROGRESS  2. Demo improved balance/postural control as evidenced by normal-mild sway Condition 4 M-CTSIB  Baseline: Moderate  Goal status: IN PROGRESS   LONG TERM GOALS: Target date: 01/15/2023    Demo improved balance and postural control per score 24/28 MiniBESTest Baseline: 21/28 Goal status: IN PROGRESS  2.  Pt to demo independent HEP for balance and lumbar issues Baseline:  Goal status: IN PROGRESS    ASSESSMENT:  CLINICAL IMPRESSION: Iniittaed with HIIT for CV training and benefits of fast alternating and force production to improve coordination. Gait training activities to improve reciprocal arm swing for greater efficiency. Dynamic gait activities to improve response to demands with sudden start/stop and change of direction.  Difficulty with retro-walking eliciting small steps and limited hip extension, this was also reflected in push-release testing with less functional response to retro-LOB  OBJECTIVE IMPAIRMENTS: decreased activity tolerance, decreased balance, decreased coordination, decreased endurance, decreased knowledge of use of DME, decreased strength, improper body mechanics, postural dysfunction, and pain.   ACTIVITY LIMITATIONS: carrying, sitting, standing, and locomotion level  PARTICIPATION LIMITATIONS: shopping and  community activity  PERSONAL FACTORS: Age, Time since onset of injury/illness/exacerbation, and 1 comorbidity: PD and LBP  are also affecting patient's functional outcome.   REHAB POTENTIAL: Good  CLINICAL DECISION MAKING: Stable/uncomplicated  EVALUATION COMPLEXITY: Low  PLAN:  PT FREQUENCY: 1x/week  PT DURATION: 6 weeks  PLANNED INTERVENTIONS: Therapeutic exercises, Therapeutic activity, Neuromuscular re-education, Balance training, Gait training, Patient/Family education, Self Care, Joint mobilization, Joint manipulation, Stair training, Vestibular training, Canalith repositioning, Orthotic/Fit training, DME instructions, Aquatic Therapy, Dry Needling, Electrical stimulation, Cryotherapy, Moist heat, Traction, Ultrasound, Ionotophoresis 4mg /ml Dexamethasone, and Manual therapy  PLAN FOR NEXT SESSION: list of PD-specific community activities, more lumbar specific assessment   4:19 PM, 12/24/22 M. Shary Decamp, PT, DPT Physical Therapist- [place] Office Number: [phone]

## 2022-12-31 ENCOUNTER — Ambulatory Visit: Payer: PPO | Admitting: Rehabilitative and Restorative Service Providers"

## 2022-12-31 ENCOUNTER — Encounter: Payer: Self-pay | Admitting: Rehabilitative and Restorative Service Providers"

## 2022-12-31 ENCOUNTER — Ambulatory Visit: Payer: PPO

## 2022-12-31 DIAGNOSIS — R269 Unspecified abnormalities of gait and mobility: Secondary | ICD-10-CM

## 2022-12-31 DIAGNOSIS — R278 Other lack of coordination: Secondary | ICD-10-CM

## 2022-12-31 DIAGNOSIS — R279 Unspecified lack of coordination: Secondary | ICD-10-CM

## 2022-12-31 DIAGNOSIS — M6281 Muscle weakness (generalized): Secondary | ICD-10-CM

## 2022-12-31 DIAGNOSIS — R293 Abnormal posture: Secondary | ICD-10-CM

## 2022-12-31 DIAGNOSIS — M62838 Other muscle spasm: Secondary | ICD-10-CM

## 2022-12-31 DIAGNOSIS — R2681 Unsteadiness on feet: Secondary | ICD-10-CM

## 2022-12-31 NOTE — Therapy (Signed)
OUTPATIENT OCCUPATIONAL THERAPY PARKINSON'S TREATMENT NOTE  Patient Name: Ashley Neal MRN: [id number] DOB:[birth year], 77 y.o., female Today's Date: 12/31/2022  PCP: [name], NP REFERRING PROVIDER: [name], NP  END OF SESSION:  OT End of Session - 12/31/22 1549     Visit Number 4    Number of Visits 9    Date for OT Re-Evaluation 01/30/23    Authorization Type [employer]    OT Start Time 1532    OT Stop Time 1612    OT Time Calculation (min) 40 min    Activity Tolerance Patient tolerated treatment well;Patient limited by pain;Patient limited by fatigue    Behavior During Therapy [facility] for tasks assessed/performed;Restless             Past Medical History:  Diagnosis Date   Anxiety    Basal cell carcinoma    Depression    Hypertension    Neuromuscular disorder (HCC)    Osteoarthritis    Osteopenia    Parkinson disease    Pneumonia    Tremors of nervous system    legs   uterine ca 10/08/2021   also endometrial cancer   Past Surgical History:  Procedure Laterality Date   CARPAL TUNNEL RELEASE Bilateral 1996/1997   CYSTOSCOPY N/A 10/08/2021   Procedure: CYSTOSCOPY;  Surgeon: Carver Fila, MD;  Location: [facility];  Service: Gynecology;  Laterality: N/A;   ROBOTIC ASSISTED TOTAL HYSTERECTOMY WITH BILATERAL SALPINGO OOPHERECTOMY N/A 10/08/2021   Procedure: XI ROBOTIC ASSISTED TOTAL HYSTERECTOMY WITH BILATERAL SALPINGO OOPHORECTOMY;SENTINEL LYMPH NODE INJECTION;  Surgeon: Carver Fila, MD;  Location: [facility];  Service: Gynecology;  Laterality: N/A;   SQUAMOUS CELL CARCINOMA EXCISION Left 2017   Left jawline   TUBAL LIGATION  1980's   Patient Active Problem List   Diagnosis Date Noted   Endometrial cancer (HCC) 10/16/2021   Complex atypical endometrial hyperplasia 09/27/2021   Primary osteoarthritis of both hands 05/14/2020   Primary osteoarthritis of both feet 05/14/2020   Age-related osteoporosis without current  pathological fracture 05/14/2020   Prediabetes 05/14/2020   Essential hypertension 05/14/2020   History of hyperlipidemia 05/14/2020   History of squamous cell carcinoma 05/14/2020   Parkinsonism 05/14/2020    ONSET DATE: referral date 11/14/22  REFERRING DIAG: G20.A1 (ICD-10-CM) - Parkinson's disease, unspecified whether dyskinesia present, unspecified whether manifestations fluctuate M54.16 (ICD-10-CM) - Lumbar radiculopathy  THERAPY DIAG:  Muscle weakness (generalized)  Unsteadiness on feet  Other lack of coordination  Other muscle spasm  Unspecified lack of coordination  Rationale for Evaluation and Treatment: Rehabilitation  PERTINENT HISTORY: PD, HTN, osteoarthritis, chronic back pain, depression "I hate Parkinson's"  Pt reports that her mother's sister and pt's brother both had PD.  Pt reports past 2 years have been awful; with onset of back pain, uterine cancer s/p hysterectomy and lymph node removal, and persistent back ache.  Pt reports that she has difficulty with managing buttons, feeding herself (especially cutting).  Pt reports she has been going [facility].    PRECAUTIONS: Fall ; WEIGHT BEARING RESTRICTIONS: No   SUBJECTIVE:   SUBJECTIVE STATEMENT: As she is leaving PT session, her PT and herself inform me that her blood pressure is low today, so to use caution with treatment.  It was approx 100/60 to begin, but with seated exercise increased to 110/70.  With standing, however, it dropped to 88/60 and standing exercise further decreased BP to approx 85/55.   She spent the  majority of time seated today due to this, and she was largely asymptomatic. She states feeling well as she left the session as well.     PAIN:  Are you having pain?   Yes: NPRS scale: 3/10 (chronic pain)  Pain location: pain in pelvis and lower back pain Pain description: radiating Aggravating factors: sitting Relieving factors: medication, xanax worked (but they  are not prescribing it to her anymore)  FALLS: Has patient fallen in last 6 months? Yes. 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   PATIENT GOALS: to be able to maintain typical living skills to remain in home   OBJECTIVE: (All objective assessments below are from initial evaluation on: 12/04/22 unless otherwise specified.)   HAND DOMINANCE: Right  ADLs: Transfers/ambulation related to ADLs: Independent Eating: typically will eat hand held items, difficulty with cutting foods Grooming: difficulty with styling hair UB Dressing: buttons are challenging, fastens bra in front LB Dressing: typically will lean up against something and complete in standing Toileting: Mod I, grab bar next to toilet Bathing: Mod I, has shower chair but doesn't use it Tub Shower transfers: had tub/shower remodeled to walk in shower Equipment: Shower seat with back, Grab bars, Walk in shower, Reacher, and Long handled shoe horn  IADLs: Shopping: order online, son will pick up for her Light housekeeping: reports that she can't clean as well as she used to Meal Prep: makes simple meals Community mobility: still driving Medication management: easy off caps, uses phone as reminder to take Handwriting: 90% legible, Increased time, Moderate micrographia, and PPT #1: 14.59 ([place])  MOBILITY STATUS: Hx of falls  POSTURE COMMENTS:  rounded shoulders  ACTIVITY TOLERANCE: Activity tolerance: WFL for tasks assessed on eval  FUNCTIONAL OUTCOME MEASURES: 12/17/22: Grip strength measures: Rt:  15#; Lt: 23#  Eval: Fastening/unfastening 3 buttons: 1:07.94 Physical performance test: PPT#2 (simulated eating) 13.57 sec & PPT#4 (donning/doffing jacket): 13.37sec (12/17/22)  COORDINATION: 9 Hole Peg test: Right: 1:10.91 sec; Left: 57.75 sec Box and Blocks:  Right 48 blocks, Left 46 blocks Tremors: Resting and Right  UE ROM:   12/24/22: Rt Sh:   Flexion:  100*, Abd: 105*, ER: 46* tender, IR 44*   UE MMT:   WFL  SENSATION: Decreased sensation in finger tips in B hands  COGNITION: Overall cognitive status: Within functional limits for tasks assessed  OBSERVATIONS: Bradykinesia   TODAY'S TREATMENT:                                                                                                                              12/31/22:  OT starts with a review of her low back stretches followed by review of the new shoulder stretches and HEP, she does well with these stating understanding.  Her shoulder still bothering her a bit today with overhead reaching.  Additionally OT reviews in hand manipulation skills with a large chunk of foam and she was unable to do the small pen today.  She required cues to help her with this but eventually started doing better and smoother motion.  She was advised to practice these things at least twice every day as she states doing them infrequently or skipping days.  Her low back is feeling better, but again she had stopped performing and started feeling worse again.  OT emphasizes that she needs to maintain her health.  During the session today, OT had to monitor her blood pressure from time to time which also she required rest breaks for.  Exercises - Seated Scapular Retraction  - 4 x daily - 5-10 reps - Seated Shoulder Flexion Towel Slide at Table Top  - 4 x daily - 3-5 reps - 15 hold - Seated Shoulder Abduction Towel Slide at Table Top  - 3-4 x daily - 3-5 reps - 15 hold - Seated Shoulder External Rotation PROM on Table  - 3-4 x daily - 3-5 reps - 15 sec  hold - Standing neck/upper traps stretch  - 4-6 x daily - 3-5 reps - 15 sec hold  In-Hand Manipulation Skills Rotation:  Hold pen, try to "twirl" like a baton, keeping flat with surface of table. Try going BOTH directions 10x  Flip:  Hold pen in writing position, flip in an arch to "erase" position, then back to "write" position. Do not lift hand off table.  10x  Translation:  Open hand palm up,  put an object in your palm and then use your fingers and thumb to move it to the tips of your fingers, pinched against your thumb.  10x (bigger is easier (fat marker), smaller is harder (penny)) Shift:  Hold pen like a dart, start "shifting" it forward until you are holding it at the base, then shift it backwards until you are holding it at the tip again. 10x (like putting a key in a key hole)     PATIENT EDUCATION: Education details: Educated on role and purpose of OT as well as potential interventions and goals for therapy based on initial evaluation findings. Person educated: Patient Education method: Explanation Education comprehension: verbalized understanding and needs further education  HOME EXERCISE PROGRAM: Access Code: [email or web address] URL: [email or web address] Date: 12/24/2022 Prepared by: Fannie Knee   GOALS: Goals reviewed with patient? Yes  SHORT TERM GOALS: Target date: 01/02/23  Pt will be independent with PD specific HEP. Baseline: needs plan Goal status: 01/02/23: MET-she states following along with LSVT exercises online at home.  2.  Pt will verbalize understanding of adapted strategies to maximize safety and I with ADLs/ IADLs. Baseline:  Goal status: 01/02/23: Progressing  3.  Pt will demonstrate improved fine motor coordination for ADLs as evidenced by decreasing 9 hole peg test score for RUE by 5 secs Baseline: 9 Hole Peg test: Right: 1:10.91 sec; Left: 57.75 sec Goal status: 01/02/23: Not tested today but is being addressed with fine motor skills  training  4.  Pt will demonstrate improved grip strength as needed to mange clothing fasteners and open various household containers. Baseline: grip strength 15# Rt, 23# Lt Goal status: 01/02/23: Progressing   LONG TERM GOALS: Target date: 01/30/23  Pt  will write a short paragraph with 100% legibility and no significant decrease in letter size Baseline: 90% legibility with mild-mod decrease in size Goal status: INITIAL  2.  Pt will demonstrate improved ease with fastening buttons as evidenced by decreasing 3 button/ unbutton time to <60 seconds. Baseline:  3 buttons: 1:07.94 Goal status: INITIAL  3.  Pt will demonstrate improved fine motor coordination for ADLs as evidenced by decreasing 9 hole peg test score for RUE by 10 sec and LUE by 5 secs Baseline: 9 Hole Peg test: Right: 1:10.91 sec; Left: 57.75 sec Goal status: INITIAL  4.  Pt will demonstrate improved ease with feeding as evidenced by decreasing PPT#2 (self feeding) by 3 secs Baseline: 13.57 sec Goal status: INITIAL  5.  Pt will verbalize understanding of ways to prevent future PD related complications and PD community resources. Baseline:  Goal status: INITIAL  ASSESSMENT:  CLINICAL IMPRESSION: 12/31/22: She needs to be consistent with home exercise program but not in such a way that it burns her out or overly fatigues her.  Her blood pressure being low is an issue today and somewhat limited treatment, so this may need checked in future sessions to ensure safety.   PLAN:  OT FREQUENCY: 1-2x/week  OT DURATION: 8 weeks  PLANNED INTERVENTIONS: self care/ADL training, therapeutic exercise, therapeutic activity, neuromuscular re-education, manual therapy, passive range of motion, balance training, functional mobility training, ultrasound, compression bandaging, moist heat, cryotherapy, contrast bath, patient/family education, psychosocial skills training, energy conservation, coping strategies training, and DME and/or AE  instructions  CONSULTED AND AGREED WITH PLAN OF CARE: Patient  PLAN FOR NEXT SESSION:  As needed, review activities for fine motor skills improving coordination and hand use as she states difficulty using utensils and things around the home.  Right shoulder stiffness and pain can be addressed as well as well as any other limiting factors.  Monitor blood pressure as needed   Fannie Knee, OTR/L 12/31/2022, 4:51 PM

## 2022-12-31 NOTE — Therapy (Signed)
OUTPATIENT PHYSICAL THERAPY NEURO TREATMENT   Patient Name: Ashley Neal MRN: [id number] DOB:Jan 02, 1946, 77 y.o., female Today's Date: 12/31/2022   PCP: [name], NP REFERRING PROVIDER: [name], NP  END OF SESSION:  PT End of Session - 12/31/22 1433     Visit Number 4    Number of Visits 6    Date for PT Re-Evaluation 01/15/23    Authorization Type [employer]    PT Start Time 1445    PT Stop Time 1530    PT Time Calculation (min) 45 min             Past Medical History:  Diagnosis Date   Anxiety    Basal cell carcinoma    Depression    Hypertension    Neuromuscular disorder (HCC)    Osteoarthritis    Osteopenia    Parkinson disease    Pneumonia    Tremors of nervous system    legs   uterine ca 10/08/2021   also endometrial cancer   Past Surgical History:  Procedure Laterality Date   CARPAL TUNNEL RELEASE Bilateral 1996/1997   CYSTOSCOPY N/A 10/08/2021   Procedure: CYSTOSCOPY;  Surgeon: Carver Fila, MD;  Location: [facility];  Service: Gynecology;  Laterality: N/A;   ROBOTIC ASSISTED TOTAL HYSTERECTOMY WITH BILATERAL SALPINGO OOPHERECTOMY N/A 10/08/2021   Procedure: XI ROBOTIC ASSISTED TOTAL HYSTERECTOMY WITH BILATERAL SALPINGO OOPHORECTOMY;SENTINEL LYMPH NODE INJECTION;  Surgeon: Carver Fila, MD;  Location: [facility];  Service: Gynecology;  Laterality: N/A;   SQUAMOUS CELL CARCINOMA EXCISION Left 2017   Left jawline   TUBAL LIGATION  1980's   Patient Active Problem List   Diagnosis Date Noted   Endometrial cancer (HCC) 10/16/2021   Complex atypical endometrial hyperplasia 09/27/2021   Primary osteoarthritis of both hands 05/14/2020   Primary osteoarthritis of both feet 05/14/2020   Age-related osteoporosis without current pathological fracture 05/14/2020   Prediabetes 05/14/2020   Essential hypertension 05/14/2020   History of hyperlipidemia 05/14/2020   History of squamous cell carcinoma 05/14/2020    Parkinsonism 05/14/2020    ONSET DATE: several years  REFERRING DIAG: G20.A1 (ICD-10-CM) - Parkinson's disease, unspecified whether dyskinesia present, unspecified whether manifestations fluctuate M54.16 (ICD-10-CM) - Lumbar radiculopathy  THERAPY DIAG:  Muscle weakness (generalized)  Unsteadiness on feet  Abnormality of gait and mobility  Abnormal posture  Rationale for Evaluation and Treatment: Rehabilitation  SUBJECTIVE:  SUBJECTIVE STATEMENT: Having an "off" day for the past 3 days.  Noticing increased unsteadiness.  She reports her diastolic BP has been elevated 57-846 mmHg.   Pt accompanied by: self  PERTINENT HISTORY: She sees Dr Ethelene Hal due to chronic back pain. Recently had steroid injection  PAIN:  Are you having pain? Yes: NPRS scale: varies/10 Pain location: centered around coccyx left side Pain description: heat on surface of area of pain, burning/searing pain Aggravating factors: sitting, prolonged walking, prolonged standing   Relieving factors: using horseshoe cushion, walking  PRECAUTIONS: None  WEIGHT BEARING RESTRICTIONS: No  FALLS: Has patient fallen in last 6 months? Yes. Number of falls 3--reports these were the syncopal episodes  LIVING ENVIRONMENT: Lives with: lives with their family and lives with their son Lives in: House/apartment Stairs: 1 STE Has following equipment at home: shower chair and Grab bars  PLOF: Independent with basic ADLs, Independent with household mobility without device, and Independent with community mobility without device  PATIENT GOALS: decr back pain and improve mobility  OBJECTIVE:   TODAY'S TREATMENT: 12/31/22 Activity Comments  Vitals (sitting) 95/66 mmHg, 88 bpm  Pt education Gentle hydration, positioning in order to accommodate  low BP  SAQ 3x10 4#  Vitals (supine) 135/78 mmHg, 80 bpm  Supine hip add iso 3x10   Supine abd set 2x10 For contract-relax  Vitals (sitting) 101/57 mmHg, 80 bpm  LAQ 3x10 4#  Vitals (standing x 1 min) 86/65 mmHg, 87 bpm  Vitals (standing x 3 min) Unable to maintain standing       PATIENT EDUCATION: Education details:  Person educated: Patient Education method: Explanation Education comprehension: verbalized understanding  HOME EXERCISE PROGRAM: Access Code: [email or web address] URL: [email or web address] Date: 12/17/2022 Prepared by: Shary Decamp  Exercises - Sit to Stand with Resistance Around Legs  - 1 x daily - 7 x weekly - 3 sets - 10 reps - Standing Foot Tap on Box (BKA)  - 1 x daily - 7 x weekly - 3 sets - 15 sec hold -Hip BUMPS (need to find picture)  DIAGNOSTIC FINDINGS:  MRI Pelvis IMPRESSION: 1. No acute findings or clear explanation for the patient's symptoms. 2. Although incompletely visualized, grossly stable partial tear of the left common hamstring tendon. Mild gluteus tendinosis bilaterally. 3. No evidence of sacral fracture or metastatic disease. Mild sacroiliac degenerative changes bilaterally. IMPRESSION: 1. Mild left foraminal narrowing at L3-4 and L5-S1. 2. Mild foraminal narrowing bilaterally at L4-5. 3. Slight anterolisthesis at L3-4 and L4-5.  COGNITION: Overall cognitive status: Within functional limits for tasks assessed   SENSATION:   COORDINATION: Difficulty with rapid alternating  EDEMA:  none  MUSCLE TONE: WNL  MUSCLE LENGTH: DNT     POSTURE: flexed trunk   LOWER EXTREMITY ROM:     WNL  LOWER EXTREMITY MMT:    Grossly 4/5 BLE    BED MOBILITY:  indep  TRANSFERS: Assistive device utilized: None  Sit to stand: Complete Independence Stand to sit: Complete Independence Chair to chair: Complete Independence Floor:  NT   CURB:  Level of Assistance: Complete Independence Assistive device utilized:  None Curb Comments:   STAIRS: Level of Assistance: Modified independence Stair Negotiation Technique: Alternating Pattern  with Single Rail on Right Number of Stairs:   Height of Stairs:   Comments:   GAIT: Gait pattern: WFL Distance walked:  Assistive device utilized: None Level of assistance: Complete Independence Comments:   FUNCTIONAL TESTS:  5 times sit to stand: 7 sec Timed up and go (TUG):  6.85 Mini-BesTest:21/28  TUG test: 6.85 sec  M-CTSIB  Condition 1: Firm Surface, EO 30 Sec, Normal Sway  Condition 2: Firm Surface, EC 30 Sec, Normal Sway  Condition 3: Foam Surface, EO 30 Sec, Mild Sway  Condition 4: Foam Surface, EC 30 Sec, Moderate Sway           GOALS: Goals reviewed with patient? Yes  SHORT TERM GOALS: Target date: 12/25/2022    Patient will be independent in HEP to improve functional outcomes Baseline: Goal status: IN PROGRESS  2. Demo improved balance/postural control as evidenced by normal-mild sway Condition 4 M-CTSIB  Baseline: Moderate  Goal status: IN PROGRESS   LONG TERM GOALS: Target date: 01/15/2023    Demo improved balance and postural control per score 24/28 MiniBESTest Baseline: 21/28 Goal status: IN PROGRESS  2.  Pt to demo independent HEP for balance and lumbar issues Baseline:  Goal status: IN PROGRESS    ASSESSMENT:  CLINICAL IMPRESSION: Presents today with decreased BP and pt reports general feeling of being "off" with decreased pressures in sitting/standing and improved by supine position.  Activities performed in supine to accommodate this. Vitals monitored with activities and position changes, but remains symptomatic.  Pt performed gentle hydration throughout session and was able to drink some water fortified with electrolytes as well.  Encouraged continued hydration throughout the day and vitals monitoring.   OBJECTIVE IMPAIRMENTS: decreased activity tolerance, decreased balance, decreased coordination, decreased  endurance, decreased knowledge of use of DME, decreased strength, improper body mechanics, postural dysfunction, and pain.   ACTIVITY LIMITATIONS: carrying, sitting, standing, and locomotion level  PARTICIPATION LIMITATIONS: shopping and community activity  PERSONAL FACTORS: Age, Time since onset of injury/illness/exacerbation, and 1 comorbidity: PD and LBP  are also affecting patient's functional outcome.   REHAB POTENTIAL: Good  CLINICAL DECISION MAKING: Stable/uncomplicated  EVALUATION COMPLEXITY: Low  PLAN:  PT FREQUENCY: 1x/week  PT DURATION: 6 weeks  PLANNED INTERVENTIONS: Therapeutic exercises, Therapeutic activity, Neuromuscular re-education, Balance training, Gait training, Patient/Family education, Self Care, Joint mobilization, Joint manipulation, Stair training, Vestibular training, Canalith repositioning, Orthotic/Fit training, DME instructions, Aquatic Therapy, Dry Needling, Electrical stimulation, Cryotherapy, Moist heat, Traction, Ultrasound, Ionotophoresis 4mg /ml Dexamethasone, and Manual therapy  PLAN FOR NEXT SESSION: list of PD-specific community activities, more lumbar specific assessment   2:33 PM, 12/31/22 M. Shary Decamp, PT, DPT Physical Therapist- [place] Office Number: [phone]

## 2023-01-01 NOTE — Therapy (Signed)
OUTPATIENT OCCUPATIONAL THERAPY PARKINSON'S TREATMENT NOTE  Patient Name: Ashley Neal MRN: [id number] DOB:May 30, 1946, 77 y.o., female Today's Date: 01/07/2023  PCP: [name], NP REFERRING PROVIDER: [name], NP  END OF SESSION:  OT End of Session - 01/07/23 1534     Visit Number 5    Number of Visits 9    Date for OT Re-Evaluation 01/30/23    Authorization Type [employer]    OT Start Time 1532    OT Stop Time 1612    OT Time Calculation (min) 40 min    Activity Tolerance Patient tolerated treatment well;Patient limited by pain;Patient limited by fatigue    Behavior During Therapy [facility] for tasks assessed/performed;Restless              Past Medical History:  Diagnosis Date   Anxiety    Basal cell carcinoma    Depression    Hypertension    Neuromuscular disorder (HCC)    Osteoarthritis    Osteopenia    Parkinson disease    Pneumonia    Tremors of nervous system    legs   uterine ca 10/08/2021   also endometrial cancer   Past Surgical History:  Procedure Laterality Date   CARPAL TUNNEL RELEASE Bilateral 1996/1997   CYSTOSCOPY N/A 10/08/2021   Procedure: CYSTOSCOPY;  Surgeon: Carver Fila, MD;  Location: [facility];  Service: Gynecology;  Laterality: N/A;   ROBOTIC ASSISTED TOTAL HYSTERECTOMY WITH BILATERAL SALPINGO OOPHERECTOMY N/A 10/08/2021   Procedure: XI ROBOTIC ASSISTED TOTAL HYSTERECTOMY WITH BILATERAL SALPINGO OOPHORECTOMY;SENTINEL LYMPH NODE INJECTION;  Surgeon: Carver Fila, MD;  Location: [facility];  Service: Gynecology;  Laterality: N/A;   SQUAMOUS CELL CARCINOMA EXCISION Left 2017   Left jawline   TUBAL LIGATION  1980's   Patient Active Problem List   Diagnosis Date Noted   Endometrial cancer (HCC) 10/16/2021   Complex atypical endometrial hyperplasia 09/27/2021   Primary osteoarthritis of both hands 05/14/2020   Primary osteoarthritis of both feet 05/14/2020   Age-related osteoporosis without current  pathological fracture 05/14/2020   Prediabetes 05/14/2020   Essential hypertension 05/14/2020   History of hyperlipidemia 05/14/2020   History of squamous cell carcinoma 05/14/2020   Parkinsonism 05/14/2020    ONSET DATE: referral date 11/14/22  REFERRING DIAG: G20.A1 (ICD-10-CM) - Parkinson's disease, unspecified whether dyskinesia present, unspecified whether manifestations fluctuate M54.16 (ICD-10-CM) - Lumbar radiculopathy  THERAPY DIAG:  Muscle weakness (generalized)  Other lack of coordination  Unspecified lack of coordination  Rationale for Evaluation and Treatment: Rehabilitation  PERTINENT HISTORY: PD, HTN, osteoarthritis, chronic back pain, depression "I hate Parkinson's"  Pt reports that her mother's sister and pt's brother both had PD.  Pt reports past 2 years have been awful; with onset of back pain, uterine cancer s/p hysterectomy and lymph node removal, and persistent back ache.  Pt reports that she has difficulty with managing buttons, feeding herself (especially cutting).  Pt reports she has been going to a [facility] class [facility] the senior center.    PRECAUTIONS: Fall ; WEIGHT BEARING RESTRICTIONS: No   SUBJECTIVE:   SUBJECTIVE STATEMENT: She states that she has been working on pen tricks and doing a bit better. He low back pain is being manage. Her BP is still low in standing today.    PAIN:  Are you having pain?   Yes: NPRS scale: 1-2/10 (chronic pain)  Pain location: pain in pelvis and lower back pain Pain description: radiating Aggravating factors: sitting Relieving factors: medication, xanax worked (  but they are not prescribing it to her anymore)  FALLS: Has patient fallen in last 6 months? Yes. 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   PATIENT GOALS: to be able to maintain typical living skills to remain in home   OBJECTIVE: (All objective assessments below are from initial evaluation on: 12/04/22 unless otherwise specified.)   HAND DOMINANCE: Right  ADLs: Transfers/ambulation related to ADLs: Independent Eating: typically will eat hand held items, difficulty with cutting foods Grooming: difficulty with styling hair UB Dressing: buttons are challenging, fastens bra in front LB Dressing: typically will lean up against something and complete in standing Toileting: Mod I, grab bar next to toilet Bathing: Mod I, has shower chair but doesn't use it Tub Shower transfers: had tub/shower remodeled to walk in shower Equipment: Shower seat with back, Grab bars, Walk in shower, Reacher, and Long handled shoe horn  IADLs: Shopping: order online, son will pick up for her Light housekeeping: reports that she can't clean as well as she used to Meal Prep: makes simple meals Community mobility: still driving Medication management: easy off caps, uses phone as reminder to take Handwriting: 90% legible, Increased time, Moderate micrographia, and PPT #1: 14.59 ([place])  MOBILITY STATUS: Hx of falls  POSTURE COMMENTS:  rounded shoulders  ACTIVITY TOLERANCE: Activity tolerance: WFL for tasks assessed on eval  FUNCTIONAL OUTCOME MEASURES: 12/17/22: Grip strength measures: Rt: 15#; Lt: 23#  Eval: Fastening/unfastening 3 buttons: 1:07.94 Physical performance test: PPT#2 (simulated eating) 13.57 sec & PPT#4 (donning/doffing jacket): 13.37sec (12/17/22)  COORDINATION: 9 Hole Peg test: Right: 1:10.91 sec; Left: 57.75 sec Box and Blocks:  Right 48 blocks, Left 46 blocks Tremors: Resting and Right  UE ROM:   01/07/23: Rt sh flex:  116*, Abd: 82*, ER: 75*, IR: 46*   12/24/22: Rt Sh:   Flexion:  100*, Abd: 105*, ER: 46* tender, IR 44*   UE MMT:   WFL  SENSATION: Decreased sensation in finger tips in B hands  COGNITION: Overall cognitive status: Within functional limits for tasks assessed  OBSERVATIONS: Bradykinesia   TODAY'S TREATMENT:                                                                                                                              01/07/23: She starts with active range of motion for exercise as well as new measures, which shows improvement in all planes except for shoulder abduction.  She feels some pain to the lateral shoulder and some clicking, so OT upgrades her home exercise program to include "sleeper stretch," which after she  performs helps alleviate this clicking and improve shoulder abduction.  Additionally she states continuing to have problems with her bra and donning a jacket.  When she uses a gown today to simulate donning a jacket, it is clear that her shoulder range of motion is limiting her overhead reach even when she learns adaptive donning technique.  She was recommended to get slightly oversized jacket and 1 with the more slick material that may help.  Additionally to help improve her strength in her shoulders OT introduces active assistive range of motion with a dowel and overhead.  As she performs that, she actually does better with each rep.  Lastly as she was "stuck" in her chair when trying to get up at 1 point, OT recommends using proprioceptive feedback like stomping her feet or clapping her hands against her lap to encourage motion.  Other shoulder stretches were briefly reviewed today verbally as well.   Exercises - Seated Scapular Retraction  - 4 x daily - 5-10 reps - Seated Shoulder Flexion Towel Slide at Table Top  - 4 x daily - 3-5 reps - 15 hold - Seated Shoulder Abduction Towel Slide at Table Top  - 3-4 x daily - 3-5 reps - 15 hold - Seated Shoulder External  Rotation PROM on Table  - 3-4 x daily - 3-5 reps - 15 sec hold - Standing neck/upper traps stretch  - 4-6 x daily - 3-5 reps - 15 sec hold - Sleeper Stretch  - 3-4 x daily - 3-5 reps - 15 hold - Seated Shoulder Flexion AAROM with Dowel  - 1-2 x daily - 1-2 sets - 10 reps   PATIENT EDUCATION: Education details: Educated on role and purpose of OT as well as potential interventions and goals for therapy based on initial evaluation findings. Person educated: Patient Education method: Explanation Education comprehension: verbalized understanding and needs further education  HOME EXERCISE PROGRAM: Access Code: [email or web address] URL: [email or web address] Date: 12/24/2022 Prepared by: Fannie Knee   GOALS: Goals reviewed with patient? Yes  SHORT TERM GOALS: Target date: 01/02/23  Pt will be independent with PD specific HEP. Baseline: needs plan Goal status: 01/02/23: MET-she states following along with LSVT exercises online at home.  2.  Pt will verbalize understanding of adapted strategies to maximize safety and I with ADLs/ IADLs. Baseline:  Goal status: 01/02/23: Progressing  3.  Pt will demonstrate improved fine motor coordination for ADLs as evidenced by decreasing 9 hole peg test score for RUE by 5 secs Baseline: 9 Hole Peg test: Right: 1:10.91 sec; Left: 57.75 sec Goal status: 01/02/23: Not tested today but is being addressed with fine motor skills training  4.  Pt will demonstrate improved grip strength as needed to mange clothing fasteners and open various household containers. Baseline: grip strength 15# Rt, 23# Lt Goal status: 01/02/23: Progressing   LONG TERM GOALS: Target date: 01/30/23  Pt will write a short paragraph with 100% legibility and no significant decrease in letter size Baseline: 90% legibility with mild-mod decrease in size Goal status: INITIAL  2.  Pt will demonstrate improved ease with fastening buttons as evidenced by decreasing 3 button/  unbutton time to <60 seconds. Baseline:  3 buttons: 1:07.94 Goal status: INITIAL  3.  Pt will demonstrate improved fine motor coordination for ADLs as evidenced by decreasing 9 hole peg test score for RUE by 10 sec and LUE by 5 secs Baseline: 9 Hole Peg test: Right: 1:10.91 sec; Left: 57.75 sec Goal status: INITIAL  4.  Pt will demonstrate improved ease with feeding as evidenced by decreasing PPT#2 (self feeding) by 3 secs Baseline: 13.57 sec Goal status: INITIAL  5.  Pt will verbalize understanding of ways to prevent future PD related complications and PD community resources. Baseline:  Goal status: INITIAL  ASSESSMENT:  CLINICAL IMPRESSION: 01/07/23: Her dressing skills seem mainly limited by UE tightness/weakness and also FMS.  These things are being addressed, and she need to be consistent with HEP.   12/31/22: She needs to be consistent with home exercise program but not in such a way that it burns her out or overly fatigues her.  Her blood pressure being low is an issue today and somewhat limited treatment, so this may need checked in future sessions to ensure safety.   PLAN:  OT FREQUENCY: 1-2x/week  OT DURATION: 8 weeks  PLANNED INTERVENTIONS: self care/ADL training, therapeutic exercise, therapeutic activity, neuromuscular re-education, manual therapy, passive range of motion, balance training, functional mobility training, ultrasound, compression bandaging, moist heat, cryotherapy, contrast bath, patient/family education, psychosocial skills training, energy conservation, coping strategies training, and DME and/or AE instructions  CONSULTED AND AGREED WITH PLAN OF CARE: Patient  PLAN FOR NEXT SESSION:  Continue working on functional skills and improving UE motion and ability.  She states that she is in contact with her physician about her low blood pressure recently and this is monitored by OT for safety during sessions.  Just be aware it has been running low   Fannie Knee, OTR/L 01/07/2023, 5:37 PM

## 2023-01-07 ENCOUNTER — Ambulatory Visit: Payer: PPO | Admitting: Rehabilitative and Restorative Service Providers"

## 2023-01-07 ENCOUNTER — Encounter: Payer: Self-pay | Admitting: Rehabilitative and Restorative Service Providers"

## 2023-01-07 ENCOUNTER — Ambulatory Visit: Payer: PPO

## 2023-01-07 DIAGNOSIS — R269 Unspecified abnormalities of gait and mobility: Secondary | ICD-10-CM

## 2023-01-07 DIAGNOSIS — R293 Abnormal posture: Secondary | ICD-10-CM

## 2023-01-07 DIAGNOSIS — M5459 Other low back pain: Secondary | ICD-10-CM

## 2023-01-07 DIAGNOSIS — R279 Unspecified lack of coordination: Secondary | ICD-10-CM

## 2023-01-07 DIAGNOSIS — M6281 Muscle weakness (generalized): Secondary | ICD-10-CM

## 2023-01-07 DIAGNOSIS — R278 Other lack of coordination: Secondary | ICD-10-CM

## 2023-01-07 DIAGNOSIS — R2681 Unsteadiness on feet: Secondary | ICD-10-CM

## 2023-01-07 NOTE — Therapy (Signed)
OUTPATIENT PHYSICAL THERAPY NEURO TREATMENT   Patient Name: Ashley Neal MRN: [id number] DOB:[birth year], 77 y.o., female Today's Date: 01/07/2023   PCP: [name], NP REFERRING PROVIDER: [name], NP  END OF SESSION:  PT End of Session - 01/07/23 1457     Visit Number 5    Number of Visits 6    Date for PT Re-Evaluation 01/15/23    Authorization Type [employer]    PT Start Time 1450    PT Stop Time 1530    PT Time Calculation (min) 40 min             Past Medical History:  Diagnosis Date   Anxiety    Basal cell carcinoma    Depression    Hypertension    Neuromuscular disorder (HCC)    Osteoarthritis    Osteopenia    Parkinson disease    Pneumonia    Tremors of nervous system    legs   uterine ca 10/08/2021   also endometrial cancer   Past Surgical History:  Procedure Laterality Date   CARPAL TUNNEL RELEASE Bilateral 1996/1997   CYSTOSCOPY N/A 10/08/2021   Procedure: CYSTOSCOPY;  Surgeon: Carver Fila, MD;  Location: [facility];  Service: Gynecology;  Laterality: N/A;   ROBOTIC ASSISTED TOTAL HYSTERECTOMY WITH BILATERAL SALPINGO OOPHERECTOMY N/A 10/08/2021   Procedure: XI ROBOTIC ASSISTED TOTAL HYSTERECTOMY WITH BILATERAL SALPINGO OOPHORECTOMY;SENTINEL LYMPH NODE INJECTION;  Surgeon: Carver Fila, MD;  Location: [facility];  Service: Gynecology;  Laterality: N/A;   SQUAMOUS CELL CARCINOMA EXCISION Left 2017   Left jawline   TUBAL LIGATION  1980's   Patient Active Problem List   Diagnosis Date Noted   Endometrial cancer (HCC) 10/16/2021   Complex atypical endometrial hyperplasia 09/27/2021   Primary osteoarthritis of both hands 05/14/2020   Primary osteoarthritis of both feet 05/14/2020   Age-related osteoporosis without current pathological fracture 05/14/2020   Prediabetes 05/14/2020   Essential hypertension 05/14/2020   History of hyperlipidemia 05/14/2020   History of squamous cell carcinoma 05/14/2020    Parkinsonism 05/14/2020    ONSET DATE: several years  REFERRING DIAG: G20.A1 (ICD-10-CM) - Parkinson's disease, unspecified whether dyskinesia present, unspecified whether manifestations fluctuate M54.16 (ICD-10-CM) - Lumbar radiculopathy  THERAPY DIAG:  Muscle weakness (generalized)  Unsteadiness on feet  Abnormality of gait and mobility  Abnormal posture  Other low back pain  Rationale for Evaluation and Treatment: Rehabilitation  SUBJECTIVE:  SUBJECTIVE STATEMENT: Contacted MD regarding BP.  Measuring orthostatics daily Pt accompanied by: self  PERTINENT HISTORY: She sees Dr Ethelene Hal due to chronic back pain. Recently had steroid injection  PAIN:  Are you having pain? Yes: NPRS scale: varies/10 Pain location: centered around coccyx left side Pain description: heat on surface of area of pain, burning/searing pain Aggravating factors: sitting, prolonged walking, prolonged standing   Relieving factors: using horseshoe cushion, walking  PRECAUTIONS: None  WEIGHT BEARING RESTRICTIONS: No  FALLS: Has patient fallen in last 6 months? Yes. Number of falls 3--reports these were the syncopal episodes  LIVING ENVIRONMENT: Lives with: lives with their family and lives with their son Lives in: House/apartment Stairs: 1 STE Has following equipment at home: shower chair and Grab bars  PLOF: Independent with basic ADLs, Independent with household mobility without device, and Independent with community mobility without device  PATIENT GOALS: decr back pain and improve mobility  OBJECTIVE:   TODAY'S TREATMENT: 01/07/23 Activity Comments  122/78 mmHg, 91 bpm sitting Standing x 1 min: 98/67 mmHg, 94 bpm Standing x 3 min: 89/65 mmHg, 95 bpm   Seated PWR! Moves 2x10 1x10 slow rehearsal 1x10  sequence  Pt education Local PD resources                  PATIENT EDUCATION: Education details:  Person educated: Patient Education method: Explanation Education comprehension: verbalized understanding  HOME EXERCISE PROGRAM: Access Code: [email or web address] URL: [email or web address] Date: 12/17/2022 Prepared by: Shary Decamp  Exercises - Sit to Stand with Resistance Around Legs  - 1 x daily - 7 x weekly - 3 sets - 10 reps - Standing Foot Tap on Box (BKA)  - 1 x daily - 7 x weekly - 3 sets - 15 sec hold -Hip BUMPS (need to find picture)  DIAGNOSTIC FINDINGS:  MRI Pelvis IMPRESSION: 1. No acute findings or clear explanation for the patient's symptoms. 2. Although incompletely visualized, grossly stable partial tear of the left common hamstring tendon. Mild gluteus tendinosis bilaterally. 3. No evidence of sacral fracture or metastatic disease. Mild sacroiliac degenerative changes bilaterally. IMPRESSION: 1. Mild left foraminal narrowing at L3-4 and L5-S1. 2. Mild foraminal narrowing bilaterally at L4-5. 3. Slight anterolisthesis at L3-4 and L4-5.  COGNITION: Overall cognitive status: Within functional limits for tasks assessed   SENSATION:   COORDINATION: Difficulty with rapid alternating  EDEMA:  none  MUSCLE TONE: WNL  MUSCLE LENGTH: DNT     POSTURE: flexed trunk   LOWER EXTREMITY ROM:     WNL  LOWER EXTREMITY MMT:    Grossly 4/5 BLE    BED MOBILITY:  indep  TRANSFERS: Assistive device utilized: None  Sit to stand: Complete Independence Stand to sit: Complete Independence Chair to chair: Complete Independence Floor:  NT   CURB:  Level of Assistance: Complete Independence Assistive device utilized: None Curb Comments:   STAIRS: Level of Assistance: Modified independence Stair Negotiation Technique: Alternating Pattern  with Single Rail on Right Number of Stairs:   Height of Stairs:   Comments:   GAIT: Gait pattern:  WFL Distance walked:  Assistive device utilized: None Level of assistance: Complete Independence Comments:   FUNCTIONAL TESTS:  5 times sit to stand: 7 sec Timed up and go (TUG): 6.85 Mini-BesTest:21/28  TUG test: 6.85 sec  M-CTSIB  Condition 1: Firm Surface, EO 30 Sec, Normal Sway  Condition 2: Firm Surface, EC 30 Sec, Normal Sway  Condition 3: Foam Surface, EO 30 Sec, Mild Sway  Condition 4: Foam  Surface, EC 30 Sec, Moderate Sway           GOALS: Goals reviewed with patient? Yes  SHORT TERM GOALS: Target date: 12/25/2022    Patient will be independent in HEP to improve functional outcomes Baseline: Goal status: MET  2. Demo improved balance/postural control as evidenced by normal-mild sway Condition 4 M-CTSIB  Baseline: Moderate  Goal status: IN PROGRESS   LONG TERM GOALS: Target date: 01/15/2023    Demo improved balance and postural control per score 24/28 MiniBESTest Baseline: 21/28 Goal status: IN PROGRESS  2.  Pt to demo independent HEP for balance and lumbar issues Baseline:  Goal status: IN PROGRESS    ASSESSMENT:  CLINICAL IMPRESSION: Pt reports continued BP issues with symptomatic response to position changes.  Pt reports she has continued monitoring of this to report to physician.  Demonstration and performance of seated large amplitude movements to improve mobility and coordination from safety of a seated position.  Education in local and online resources for home performance to improve access and carryover.   OBJECTIVE IMPAIRMENTS: decreased activity tolerance, decreased balance, decreased coordination, decreased endurance, decreased knowledge of use of DME, decreased strength, improper body mechanics, postural dysfunction, and pain.   ACTIVITY LIMITATIONS: carrying, sitting, standing, and locomotion level  PARTICIPATION LIMITATIONS: shopping and community activity  PERSONAL FACTORS: Age, Time since onset of injury/illness/exacerbation, and 1  comorbidity: PD and LBP  are also affecting patient's functional outcome.   REHAB POTENTIAL: Good  CLINICAL DECISION MAKING: Stable/uncomplicated  EVALUATION COMPLEXITY: Low  PLAN:  PT FREQUENCY: 1x/week  PT DURATION: 6 weeks  PLANNED INTERVENTIONS: Therapeutic exercises, Therapeutic activity, Neuromuscular re-education, Balance training, Gait training, Patient/Family education, Self Care, Joint mobilization, Joint manipulation, Stair training, Vestibular training, Canalith repositioning, Orthotic/Fit training, DME instructions, Aquatic Therapy, Dry Needling, Electrical stimulation, Cryotherapy, Moist heat, Traction, Ultrasound, Ionotophoresis 4mg /ml Dexamethasone, and Manual therapy  PLAN FOR NEXT SESSION: list of PD-specific community activities, more lumbar specific assessment   2:57 PM, 01/07/23 M. Shary Decamp, PT, DPT Physical Therapist- [place] Office Number: [phone]

## 2023-01-14 ENCOUNTER — Ambulatory Visit: Payer: PPO

## 2023-01-14 ENCOUNTER — Ambulatory Visit: Payer: PPO | Attending: Nurse Practitioner | Admitting: Occupational Therapy

## 2023-01-14 VITALS — BP 101/72

## 2023-01-14 DIAGNOSIS — M5459 Other low back pain: Secondary | ICD-10-CM

## 2023-01-14 DIAGNOSIS — R293 Abnormal posture: Secondary | ICD-10-CM

## 2023-01-14 DIAGNOSIS — R2681 Unsteadiness on feet: Secondary | ICD-10-CM

## 2023-01-14 DIAGNOSIS — R208 Other disturbances of skin sensation: Secondary | ICD-10-CM | POA: Insufficient documentation

## 2023-01-14 DIAGNOSIS — R269 Unspecified abnormalities of gait and mobility: Secondary | ICD-10-CM | POA: Insufficient documentation

## 2023-01-14 DIAGNOSIS — M6281 Muscle weakness (generalized): Secondary | ICD-10-CM

## 2023-01-14 DIAGNOSIS — R278 Other lack of coordination: Secondary | ICD-10-CM | POA: Insufficient documentation

## 2023-01-14 DIAGNOSIS — R29818 Other symptoms and signs involving the nervous system: Secondary | ICD-10-CM | POA: Diagnosis not present

## 2023-01-14 DIAGNOSIS — R279 Unspecified lack of coordination: Secondary | ICD-10-CM | POA: Diagnosis not present

## 2023-01-14 NOTE — Therapy (Signed)
OUTPATIENT OCCUPATIONAL THERAPY PARKINSON'S TREATMENT NOTE  Patient Name: Ashley Neal MRN: [id number] DOB:30-Aug-1945, 77 y.o., female Today's Date: 01/14/2023  PCP: [name], NP REFERRING PROVIDER: [name], NP  END OF SESSION:  OT End of Session - 01/14/23 1538     Visit Number 6    Number of Visits 9    Date for OT Re-Evaluation 01/30/23    Authorization Type [employer]    OT Start Time 1534    OT Stop Time 1616    OT Time Calculation (min) 42 min    Activity Tolerance Patient tolerated treatment well;Patient limited by pain;Patient limited by fatigue    Behavior During Therapy [facility] for tasks assessed/performed;Restless               Past Medical History:  Diagnosis Date   Anxiety    Basal cell carcinoma    Depression    Hypertension    Neuromuscular disorder (HCC)    Osteoarthritis    Osteopenia    Parkinson disease    Pneumonia    Tremors of nervous system    legs   uterine ca 10/08/2021   also endometrial cancer   Past Surgical History:  Procedure Laterality Date   CARPAL TUNNEL RELEASE Bilateral 1996/1997   CYSTOSCOPY N/A 10/08/2021   Procedure: CYSTOSCOPY;  Surgeon: Carver Fila, MD;  Location: [facility];  Service: Gynecology;  Laterality: N/A;   ROBOTIC ASSISTED TOTAL HYSTERECTOMY WITH BILATERAL SALPINGO OOPHERECTOMY N/A 10/08/2021   Procedure: XI ROBOTIC ASSISTED TOTAL HYSTERECTOMY WITH BILATERAL SALPINGO OOPHORECTOMY;SENTINEL LYMPH NODE INJECTION;  Surgeon: Carver Fila, MD;  Location: [facility];  Service: Gynecology;  Laterality: N/A;   SQUAMOUS CELL CARCINOMA EXCISION Left 2017   Left jawline   TUBAL LIGATION  1980's   Patient Active Problem List   Diagnosis Date Noted   Endometrial cancer (HCC) 10/16/2021   Complex atypical endometrial hyperplasia 09/27/2021   Primary osteoarthritis of both hands 05/14/2020   Primary osteoarthritis of both feet 05/14/2020   Age-related osteoporosis without current  pathological fracture 05/14/2020   Prediabetes 05/14/2020   Essential hypertension 05/14/2020   History of hyperlipidemia 05/14/2020   History of squamous cell carcinoma 05/14/2020   Parkinsonism 05/14/2020    ONSET DATE: referral date 11/14/22  REFERRING DIAG: G20.A1 (ICD-10-CM) - Parkinson's disease, unspecified whether dyskinesia present, unspecified whether manifestations fluctuate M54.16 (ICD-10-CM) - Lumbar radiculopathy  THERAPY DIAG:  Muscle weakness (generalized)  Unsteadiness on feet  Other lack of coordination  Rationale for Evaluation and Treatment: Rehabilitation  PERTINENT HISTORY: PD, HTN, osteoarthritis, chronic back pain, depression "I hate Parkinson's"  Pt reports that her mother's sister and pt's brother both had PD.  Pt reports past 2 years have been awful; with onset of back pain, uterine cancer s/p hysterectomy and lymph node removal, and persistent back ache.  Pt reports that she has difficulty with managing buttons, feeding herself (especially cutting).  Pt reports she has been going to a [facility] class [facility] the senior center.    PRECAUTIONS: Fall ; WEIGHT BEARING RESTRICTIONS: No   SUBJECTIVE:   SUBJECTIVE STATEMENT: Pt reports not feeling well today, but wanting to work with therapy today. Her BP is still low in standing today.    PAIN:  Are you having pain?   Yes: NPRS scale: 1-2/10 (chronic pain)  Pain location: pain in pelvis and lower back pain Pain description: radiating Aggravating factors: sitting Relieving factors: medication, xanax worked (but they are not prescribing it to her anymore)  FALLS: Has patient fallen in last 6 months? Yes. 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   PATIENT GOALS: to be able to maintain typical living skills to remain in home   OBJECTIVE: (All objective assessments below are from initial evaluation on: 12/04/22 unless otherwise specified.)   HAND DOMINANCE: Right  ADLs: Transfers/ambulation related to ADLs: Independent Eating: typically will eat hand held items, difficulty with cutting foods Grooming: difficulty with styling hair UB Dressing: buttons are challenging, fastens bra in front LB Dressing: typically will lean up against something and complete in standing Toileting: Mod I, grab bar next to toilet Bathing: Mod I, has shower chair but doesn't use it Tub Shower transfers: had tub/shower remodeled to walk in shower Equipment: Shower seat with back, Grab bars, Walk in shower, Reacher, and Long handled shoe horn  IADLs: Shopping: order online, son will pick up for her Light housekeeping: reports that she can't clean as well as she used to Meal Prep: makes simple meals Community mobility: still driving Medication management: easy off caps, uses phone as reminder to take Handwriting: 90% legible, Increased time, Moderate micrographia, and PPT #1: 14.59 ([place])  MOBILITY STATUS: Hx of falls  POSTURE COMMENTS:  rounded shoulders  ACTIVITY TOLERANCE: Activity tolerance: WFL for tasks assessed on eval  FUNCTIONAL OUTCOME MEASURES:  12/17/22: Grip strength measures: Rt: 15#; Lt: 23#  Eval: Fastening/unfastening 3 buttons: 1:07.94 Physical performance test: PPT#2 (simulated eating) 13.57 sec & PPT#4 (donning/doffing jacket): 13.37sec (12/17/22)  COORDINATION: 9 Hole Peg test: Right: 1:10.91 sec; Left: 57.75 sec 01/14/23 R: 64.38 sec and L: 49.53 sec Box and Blocks:  Right 48 blocks, Left 46 blocks Tremors: Resting and Right  UE ROM:   01/07/23: Rt sh flex: 116*,  Abd: 82*, ER: 75*, IR: 46*   12/24/22: Rt Sh:   Flexion:  100*, Abd: 105*, ER: 46* tender, IR 44*   UE MMT:   WFL  SENSATION: Decreased sensation in finger tips in B hands  COGNITION: Overall cognitive status: Within functional limits for tasks assessed  OBSERVATIONS: Bradykinesia   TODAY'S TREATMENT:                                                                               01/14/23 Coordination: engaged in bimanual task of lacing/unlacing with focus on coordination and motor control.  Pt demonstrating slowed movements bilaterally.   9 hole peg test: R: 64.38 sec and L: 49.53 sec Simulated dressing: pt reports difficulty dressing/undressing especially with UB dressing.  Pt reports getting caught in shirt when undressing.  OT educated on donning shirt arm - arm- head (reverse to remove) with recommendations to pull sleeves over elbows to reduce risk of getting caught or having to pull up sleeves afterwards.  Pt completing simulated dressing with use of gait belt with improved ease,  however difficult to assess due to not same material or size as shirt.  OT provided demonstration with donning/doffing with sample shirt and provided pt with handout from OT toolkit.  Today's Vitals   01/14/23 1540 01/14/23 1624  BP: (!) 89/71 101/72   There is no height or weight on file to calculate BMI.    01/07/23: She starts with active range of motion for exercise as well as new measures, which shows improvement in all planes except for shoulder abduction.  She feels some pain to the lateral shoulder and some clicking, so OT upgrades her home exercise program to include "sleeper stretch," which after she performs helps alleviate this clicking and improve shoulder abduction.  Additionally she states continuing to have problems with her bra and donning a jacket.  When she uses a gown today to simulate donning a jacket, it is clear that her shoulder range of motion is limiting her overhead reach even when  she learns adaptive donning technique.  She was recommended to get slightly oversized jacket and 1 with the more slick material that may help.  Additionally to help improve her strength in her shoulders OT introduces active assistive range of motion with a dowel and overhead.  As she performs that, she actually does better with each rep.  Lastly as she was "stuck" in her chair when trying to get up at 1 point, OT recommends using proprioceptive feedback like stomping her feet or clapping her hands against her lap to encourage motion.  Other shoulder stretches were briefly reviewed today verbally as well.   Exercises - Seated Scapular Retraction  - 4 x daily - 5-10 reps - Seated Shoulder Flexion Towel Slide at Table Top  - 4 x daily - 3-5 reps - 15 hold - Seated Shoulder Abduction Towel Slide at Table Top  - 3-4 x daily - 3-5 reps - 15 hold - Seated Shoulder External Rotation PROM on Table  - 3-4 x daily - 3-5 reps - 15 sec hold - Standing neck/upper traps stretch  - 4-6 x daily - 3-5 reps - 15 sec hold - Sleeper Stretch  - 3-4 x daily - 3-5 reps - 15 hold - Seated Shoulder Flexion AAROM with Dowel  - 1-2 x daily - 1-2 sets - 10 reps   PATIENT EDUCATION: Education details: Educated on role and purpose of OT as well as potential interventions and goals for therapy based on initial evaluation findings. Person educated: Patient Education method: Explanation Education comprehension: verbalized understanding and needs further education  HOME EXERCISE PROGRAM: Access Code: [email or web address] URL: [email or web address] Date: 12/24/2022 Prepared by: Fannie Knee   GOALS: Goals reviewed with patient? Yes  SHORT TERM GOALS: Target date: 01/02/23  Pt will be independent with PD specific HEP. Baseline: needs plan Goal status: 01/02/23: MET-she states following along with LSVT exercises online at home.  2.  Pt will verbalize understanding of adapted strategies to maximize safety and I with  ADLs/ IADLs. Baseline:  Goal status: 01/02/23: Progressing  3.  Pt will demonstrate improved fine motor coordination for ADLs as evidenced by decreasing 9 hole peg test score for RUE by 5 secs Baseline: 9 Hole Peg test: Right: 1:10.91 sec; Left: 57.75 sec Goal status: Met - R: 64.38 sec and L: 49.53 sec 01/14/23  4.  Pt will demonstrate improved grip strength as needed to mange clothing fasteners and open various household containers. Baseline: grip strength 15# Rt, 23# Lt Goal status: 01/02/23: Progressing   LONG TERM GOALS: Target  date: 01/30/23  Pt will write a short paragraph with 100% legibility and no significant decrease in letter size Baseline: 90% legibility with mild-mod decrease in size Goal status: INITIAL  2.  Pt will demonstrate improved ease with fastening buttons as evidenced by decreasing 3 button/ unbutton time to <60 seconds. Baseline:  3 buttons: 1:07.94 Goal status: INITIAL  3.  Pt will demonstrate improved fine motor coordination for ADLs as evidenced by decreasing 9 hole peg test score for RUE by 10 sec and LUE by 5 secs Baseline: 9 Hole Peg test: Right: 1:10.91 sec; Left: 57.75 sec Goal status: INITIAL  4.  Pt will demonstrate improved ease with feeding as evidenced by decreasing PPT#2 (self feeding) by 3 secs Baseline: 13.57 sec Goal status: INITIAL  5.  Pt will verbalize understanding of ways to prevent future PD related complications and PD community resources. Baseline:  Goal status: INITIAL  ASSESSMENT:  CLINICAL IMPRESSION: Pt continues to report difficulty with dressing skills, therefore session focused mostly on strategies to increase success.  OT encouraged pt to sit with UB dressing due to low BP and reports of urgency to toilet when standing and reaching overhead.  Pt to trial arm-arm-head strategy and report back at next session.     PLAN:  OT FREQUENCY: 1-2x/week  OT DURATION: 8 weeks  PLANNED INTERVENTIONS: self care/ADL training,  therapeutic exercise, therapeutic activity, neuromuscular re-education, manual therapy, passive range of motion, balance training, functional mobility training, ultrasound, compression bandaging, moist heat, cryotherapy, contrast bath, patient/family education, psychosocial skills training, energy conservation, coping strategies training, and DME and/or AE instructions  CONSULTED AND AGREED WITH PLAN OF CARE: Patient  PLAN FOR NEXT SESSION:  Continue working on functional skills and improving UE motion and ability.  She states that she is in contact with her physician about her low blood pressure recently and this is monitored by OT for safety during sessions.  Just be aware it has been running low.  Plan to address self-feeding at upcoming session.   Rosalio Loud, OTR/L 01/14/2023, 4:29 PM

## 2023-01-14 NOTE — Therapy (Signed)
OUTPATIENT PHYSICAL THERAPY NEURO TREATMENT and D/C Summary   Patient Name: Ashley Neal MRN: [id number] DOB:1945/11/23, 77 y.o., female Today's Date: 01/14/2023   PCP: [name], NP REFERRING PROVIDER: [name], NP  PHYSICAL THERAPY DISCHARGE SUMMARY  Visits from Start of Care: 6  Current functional level related to goals / functional outcomes: Pt able to meet POC requirements and demo improved score to 25/28 Mini-BESTest and normal response to M-CTSIB   Remaining deficits: Continues to experience diffuse LBP and coccygeal pain   Education / Equipment: HEP and community resources   Patient agrees to discharge. Patient goals were met. Patient is being discharged due to meeting the stated rehab goals.   END OF SESSION:  PT End of Session - 01/14/23 1625     Visit Number 6    Number of Visits 6    Date for PT Re-Evaluation 01/15/23    Authorization Type [employer]    PT Start Time 1615    PT Stop Time 1700    PT Time Calculation (min) 45 min             Past Medical History:  Diagnosis Date   Anxiety    Basal cell carcinoma    Depression    Hypertension    Neuromuscular disorder (HCC)    Osteoarthritis    Osteopenia    Parkinson disease    Pneumonia    Tremors of nervous system    legs   uterine ca 10/08/2021   also endometrial cancer   Past Surgical History:  Procedure Laterality Date   CARPAL TUNNEL RELEASE Bilateral 1996/1997   CYSTOSCOPY N/A 10/08/2021   Procedure: CYSTOSCOPY;  Surgeon: Carver Fila, MD;  Location: [facility];  Service: Gynecology;  Laterality: N/A;   ROBOTIC ASSISTED TOTAL HYSTERECTOMY WITH BILATERAL SALPINGO OOPHERECTOMY N/A 10/08/2021   Procedure: XI ROBOTIC ASSISTED TOTAL HYSTERECTOMY WITH BILATERAL SALPINGO OOPHORECTOMY;SENTINEL LYMPH NODE INJECTION;  Surgeon: Carver Fila, MD;  Location: [facility];  Service: Gynecology;  Laterality: N/A;   SQUAMOUS CELL CARCINOMA EXCISION Left 2017   Left  jawline   TUBAL LIGATION  1980's   Patient Active Problem List   Diagnosis Date Noted   Endometrial cancer (HCC) 10/16/2021   Complex atypical endometrial hyperplasia 09/27/2021   Primary osteoarthritis of both hands 05/14/2020   Primary osteoarthritis of both feet 05/14/2020   Age-related osteoporosis without current pathological fracture 05/14/2020   Prediabetes 05/14/2020   Essential hypertension 05/14/2020   History of hyperlipidemia 05/14/2020   History of squamous cell carcinoma 05/14/2020   Parkinsonism 05/14/2020    ONSET DATE: several years  REFERRING DIAG: G20.A1 (ICD-10-CM) - Parkinson's disease, unspecified whether dyskinesia present, unspecified whether manifestations fluctuate M54.16 (ICD-10-CM) - Lumbar radiculopathy  THERAPY DIAG:  Muscle weakness (generalized)  Unsteadiness on feet  Abnormality of gait and mobility  Abnormal posture  Other low back pain  Rationale for Evaluation and Treatment: Rehabilitation  SUBJECTIVE:  SUBJECTIVE STATEMENT: BP is doing better today Pt accompanied by: self  PERTINENT HISTORY: She sees Dr Ethelene Hal due to chronic back pain. Recently had steroid injection  PAIN:  Are you having pain? Yes: NPRS scale: varies/10 Pain location: centered around coccyx left side Pain description: heat on surface of area of pain, burning/searing pain Aggravating factors: sitting, prolonged walking, prolonged standing   Relieving factors: using horseshoe cushion, walking  PRECAUTIONS: None  WEIGHT BEARING RESTRICTIONS: No  FALLS: Has patient fallen in last 6 months? Yes. Number of falls 3--reports these were the syncopal episodes  LIVING ENVIRONMENT: Lives with: lives with their family and lives with their son Lives in: House/apartment Stairs: 1  STE Has following equipment at home: shower chair and Grab bars  PLOF: Independent with basic ADLs, Independent with household mobility without device, and Independent with community mobility without device  PATIENT GOALS: decr back pain and improve mobility  OBJECTIVE:   TODAY'S TREATMENT: 01/14/23 Activity Comments  Large amplitude pivot turns 2x10, incr difficulty   Mini-BESTest 25/28  M-CTSIB Condition 1-4: normal/mild  Pt education PD resources/classes           TODAY'S TREATMENT: 01/07/23 Activity Comments  122/78 mmHg, 91 bpm sitting Standing x 1 min: 98/67 mmHg, 94 bpm Standing x 3 min: 89/65 mmHg, 95 bpm   Seated PWR! Moves 2x10 1x10 slow rehearsal 1x10 sequence  Pt education Local PD resources                  PATIENT EDUCATION: Education details:  Person educated: Patient Education method: Explanation Education comprehension: verbalized understanding  HOME EXERCISE PROGRAM: Access Code: [email or web address] URL: [email or web address] Date: 12/17/2022 Prepared by: Shary Decamp  Exercises - Sit to Stand with Resistance Around Legs  - 1 x daily - 7 x weekly - 3 sets - 10 reps - Standing Foot Tap on Box (BKA)  - 1 x daily - 7 x weekly - 3 sets - 15 sec hold -Hip BUMPS (need to find picture)  DIAGNOSTIC FINDINGS:  MRI Pelvis IMPRESSION: 1. No acute findings or clear explanation for the patient's symptoms. 2. Although incompletely visualized, grossly stable partial tear of the left common hamstring tendon. Mild gluteus tendinosis bilaterally. 3. No evidence of sacral fracture or metastatic disease. Mild sacroiliac degenerative changes bilaterally. IMPRESSION: 1. Mild left foraminal narrowing at L3-4 and L5-S1. 2. Mild foraminal narrowing bilaterally at L4-5. 3. Slight anterolisthesis at L3-4 and L4-5.  COGNITION: Overall cognitive status: Within functional limits for tasks assessed   SENSATION:   COORDINATION: Difficulty with rapid  alternating  EDEMA:  none  MUSCLE TONE: WNL  MUSCLE LENGTH: DNT     POSTURE: flexed trunk   LOWER EXTREMITY ROM:     WNL  LOWER EXTREMITY MMT:    Grossly 4/5 BLE    BED MOBILITY:  indep  TRANSFERS: Assistive device utilized: None  Sit to stand: Complete Independence Stand to sit: Complete Independence Chair to chair: Complete Independence Floor:  NT   CURB:  Level of Assistance: Complete Independence Assistive device utilized: None Curb Comments:   STAIRS: Level of Assistance: Modified independence Stair Negotiation Technique: Alternating Pattern  with Single Rail on Right Number of Stairs:   Height of Stairs:   Comments:   GAIT: Gait pattern: WFL Distance walked:  Assistive device utilized: None Level of assistance: Complete Independence Comments:   FUNCTIONAL TESTS:  5 times sit to stand: 7 sec Timed up and go (TUG): 6.85 Mini-BesTest:21/28  TUG test: 6.85 sec  M-CTSIB  Condition 1: Firm Surface, EO 30 Sec, Normal Sway  Condition 2: Firm Surface, EC 30 Sec, Normal Sway  Condition 3: Foam Surface, EO 30 Sec, Mild Sway  Condition 4: Foam Surface, EC 30 Sec, Moderate Sway           GOALS: Goals reviewed with patient? Yes  SHORT TERM GOALS: Target date: 12/25/2022    Patient will be independent in HEP to improve functional outcomes Baseline: Goal status: MET  2. Demo improved balance/postural control as evidenced by normal-mild sway Condition 4 M-CTSIB  Baseline: Moderate; normal-mild  Goal status: MET   LONG TERM GOALS: Target date: 01/15/2023    Demo improved balance and postural control per score 24/28 MiniBESTest Baseline: 21/28; 25/28 Goal status: MET  2.  Pt to demo independent HEP for balance and lumbar issues Baseline:  Goal status: MET    ASSESSMENT:  CLINICAL IMPRESSION: Presents with improved BP today and not feeling symptomatic at present. Initiated with large amplitude movement activities to enhance  coordination and control and proceeded to review POC details and STG/LTG meeting requirements.  Pt will be attending local community classes for PD-specific exercise intervention.  Encouraged to attend and f/u at clinic in 6 months for mobility screening.  OBJECTIVE IMPAIRMENTS: decreased activity tolerance, decreased balance, decreased coordination, decreased endurance, decreased knowledge of use of DME, decreased strength, improper body mechanics, postural dysfunction, and pain.   ACTIVITY LIMITATIONS: carrying, sitting, standing, and locomotion level  PARTICIPATION LIMITATIONS: shopping and community activity  PERSONAL FACTORS: Age, Time since onset of injury/illness/exacerbation, and 1 comorbidity: PD and LBP  are also affecting patient's functional outcome.   REHAB POTENTIAL: Good  CLINICAL DECISION MAKING: Stable/uncomplicated  EVALUATION COMPLEXITY: Low  PLAN:  PT FREQUENCY: 1x/week  PT DURATION: 6 weeks  PLANNED INTERVENTIONS: Therapeutic exercises, Therapeutic activity, Neuromuscular re-education, Balance training, Gait training, Patient/Family education, Self Care, Joint mobilization, Joint manipulation, Stair training, Vestibular training, Canalith repositioning, Orthotic/Fit training, DME instructions, Aquatic Therapy, Dry Needling, Electrical stimulation, Cryotherapy, Moist heat, Traction, Ultrasound, Ionotophoresis 4mg /ml Dexamethasone, and Manual therapy  PLAN FOR NEXT SESSION: D/C to HEP   4:25 PM, 01/14/23 M. Shary Decamp, PT, DPT Physical Therapist- [place] Office Number: [phone]

## 2023-01-20 ENCOUNTER — Ambulatory Visit: Payer: PPO | Admitting: Occupational Therapy

## 2023-01-20 ENCOUNTER — Ambulatory Visit: Payer: PPO

## 2023-01-20 ENCOUNTER — Encounter: Payer: Self-pay | Admitting: Occupational Therapy

## 2023-01-20 DIAGNOSIS — R29818 Other symptoms and signs involving the nervous system: Secondary | ICD-10-CM

## 2023-01-20 DIAGNOSIS — R208 Other disturbances of skin sensation: Secondary | ICD-10-CM

## 2023-01-20 DIAGNOSIS — M6281 Muscle weakness (generalized): Secondary | ICD-10-CM

## 2023-01-20 DIAGNOSIS — R278 Other lack of coordination: Secondary | ICD-10-CM

## 2023-01-20 DIAGNOSIS — R279 Unspecified lack of coordination: Secondary | ICD-10-CM

## 2023-01-20 NOTE — Therapy (Signed)
OUTPATIENT OCCUPATIONAL THERAPY PARKINSON'S TREATMENT NOTE  Patient Name: Ashley Neal MRN: [id number] DOB:25-Oct-1945, 77 y.o., female Today's Date: 01/20/2023  PCP: [name], NP REFERRING PROVIDER: [name], NP  END OF SESSION:  OT End of Session - 01/20/23 1019     Visit Number 7    Number of Visits 9    Date for OT Re-Evaluation 01/30/23    Authorization Type [employer]    OT Start Time 1019    OT Stop Time 1100    OT Time Calculation (min) 41 min    Equipment Utilized During Treatment yellow putty, small pegs and beads    Activity Tolerance Patient tolerated treatment well;Patient limited by pain;Patient limited by fatigue    Behavior During Therapy [facility] for tasks assessed/performed;Restless               Past Medical History:  Diagnosis Date   Anxiety    Basal cell carcinoma    Depression    Hypertension    Neuromuscular disorder (HCC)    Osteoarthritis    Osteopenia    Parkinson disease    Pneumonia    Tremors of nervous system    legs   uterine ca 10/08/2021   also endometrial cancer   Past Surgical History:  Procedure Laterality Date   CARPAL TUNNEL RELEASE Bilateral 1996/1997   CYSTOSCOPY N/A 10/08/2021   Procedure: CYSTOSCOPY;  Surgeon: Carver Fila, MD;  Location: [facility];  Service: Gynecology;  Laterality: N/A;   ROBOTIC ASSISTED TOTAL HYSTERECTOMY WITH BILATERAL SALPINGO OOPHERECTOMY N/A 10/08/2021   Procedure: XI ROBOTIC ASSISTED TOTAL HYSTERECTOMY WITH BILATERAL SALPINGO OOPHORECTOMY;SENTINEL LYMPH NODE INJECTION;  Surgeon: Carver Fila, MD;  Location: [facility];  Service: Gynecology;  Laterality: N/A;   SQUAMOUS CELL CARCINOMA EXCISION Left 2017   Left jawline   TUBAL LIGATION  1980's   Patient Active Problem List   Diagnosis Date Noted   Endometrial cancer (HCC) 10/16/2021   Complex atypical endometrial hyperplasia 09/27/2021   Primary osteoarthritis of both hands 05/14/2020   Primary  osteoarthritis of both feet 05/14/2020   Age-related osteoporosis without current pathological fracture 05/14/2020   Prediabetes 05/14/2020   Essential hypertension 05/14/2020   History of hyperlipidemia 05/14/2020   History of squamous cell carcinoma 05/14/2020   Parkinsonism 05/14/2020    ONSET DATE: referral date 11/14/22  REFERRING DIAG: G20.A1 (ICD-10-CM) - Parkinson's disease, unspecified whether dyskinesia present, unspecified whether manifestations fluctuate M54.16 (ICD-10-CM) - Lumbar radiculopathy  THERAPY DIAG:  Muscle weakness (generalized)  Other lack of coordination  Other disturbances of skin sensation  Unspecified lack of coordination  Other symptoms and signs involving the nervous system  Rationale for Evaluation and Treatment: Rehabilitation  PERTINENT HISTORY: PD, HTN, osteoarthritis, chronic back pain, depression "I hate Parkinson's"  Pt reports that her mother's sister and pt's brother both had PD.  Pt reports past 2 years have been awful; with onset of back pain, uterine cancer s/p hysterectomy and lymph node removal, and persistent back ache.  Pt reports that she has difficulty with managing buttons, feeding herself (especially cutting).  Pt reports she has been going to a [facility] the senior center.    PRECAUTIONS: Fall ; WEIGHT BEARING RESTRICTIONS: No   SUBJECTIVE:   SUBJECTIVE STATEMENT: Pt reports not feeling the same today. Her BP is still low in standing today.    PAIN:  Are you having pain?   Yes: NPRS scale: 4-5/10 (chronic pain)  Pain location: pain in pelvis and  lower back pain Pain description: radiating Aggravating factors: sitting Relieving factors: medication, xanax worked (but they are not prescribing it to her anymore)  FALLS: Has patient fallen in last 6 months? Yes. 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   PATIENT GOALS: to be able to maintain typical living skills to remain in home   OBJECTIVE: (All objective assessments below are from initial evaluation on: 12/04/22 unless otherwise specified.)   HAND DOMINANCE: Right  ADLs: Transfers/ambulation related to ADLs: Independent Eating: typically will eat hand held items, difficulty with cutting foods Grooming: difficulty with styling hair UB Dressing: buttons are challenging, fastens bra in front LB Dressing: typically will lean up against something and complete in standing Toileting: Mod I, grab bar next to toilet Bathing: Mod I, has shower chair but doesn't use it Tub Shower transfers: had tub/shower remodeled to walk in shower Equipment: Shower seat with back, Grab bars, Walk in shower, Reacher, and Long handled shoe horn  IADLs: Shopping: order online, son will pick up for her Light housekeeping: reports that she can't clean as well as she used to Meal Prep: makes simple meals Community mobility: still driving Medication management: easy off caps, uses phone as reminder to take Handwriting: 90% legible, Increased time, Moderate micrographia, and PPT #1: 14.59 ([place])  MOBILITY STATUS: Hx of falls  POSTURE COMMENTS:  rounded shoulders  ACTIVITY TOLERANCE: Activity tolerance: WFL for tasks assessed on eval  FUNCTIONAL OUTCOME MEASURES:  12/17/22: Grip strength measures: Rt: 15#; Lt: 23#  Eval: Fastening/unfastening 3 buttons: 1:07.94 Physical performance test: PPT#2 (simulated eating) 13.57 sec & PPT#4 (donning/doffing jacket): 13.37sec (12/17/22)  COORDINATION: 9 Hole Peg test: Right: 1:10.91 sec; Left: 57.75  sec 01/14/23 R: 64.38 sec and L: 49.53 sec Box and Blocks:  Right 48 blocks, Left 46 blocks Tremors: Resting and Right  UE ROM:   01/07/23: Rt sh flex: 116*, Abd: 82*, ER: 75*, IR: 46*   12/24/22: Rt Sh:   Flexion:  100*, Abd: 105*, ER: 46* tender, IR 44*   UE MMT:   WFL  SENSATION: Decreased sensation in finger tips in B hands  COGNITION: Overall cognitive status: Within functional limits for tasks assessed  OBSERVATIONS: Bradykinesia   TODAY'S TREATMENT:                      01/20/23: BP in sitting 129/89, pulse 83. BP in standing 104/82, pulse 89. Coordination: Engaged in coordination activity using small pegs and glass/marbles in yellow putty to remove and then placing in cup, right with moderate-maximal difficulty and left with minimal-moderate difficulty noted today. ADL's: Discussed using built up handles and possibly weighted utensils for ADL's and self feeding. Issued 3 different sizes of cylindrical foam and educated pt in use on utensils, toothbrush, hair brush prior to purchasing weight utensils online. Also discussed use of wide grip pens and battery operated toothbrushes as they tend to have wider grip surface area to assist with ease of use. Reviewed PWR & home program as pt reports having tremors (that she notes increase in the evenings) and not having walked much since last  week due to not feeling well. Pt reports continued difficulty with donning and doffing shirts using shirt arm, arm, head technique. Pt states that her son needed to assist her in removing a shirt the other day. Will benefit from further discussion and trial and error in clinic setting to maximize independence with this. Recommended using looser fitting shirts and stretchy material vs tighter/well fitting. Pt verbalized understanding of this in the clinic today.                                                         01/14/23 Coordination: engaged in bimanual task of lacing/unlacing with focus on coordination  and motor control.  Pt demonstrating slowed movements bilaterally.   9 hole peg test: R: 64.38 sec and L: 49.53 sec Simulated dressing: pt reports difficulty dressing/undressing especially with UB dressing.  Pt reports getting caught in shirt when undressing.  OT educated on donning shirt arm - arm- head (reverse to remove) with recommendations to pull sleeves over elbows to reduce risk of getting caught or having to pull up sleeves afterwards.  Pt completing simulated dressing with use of gait belt with improved ease, however difficult to assess due to not same material or size as shirt.  OT provided demonstration with donning/doffing with sample shirt and provided pt with handout from OT toolkit.  There were no vitals filed for this visit. BP in sitting 129/89, pulse 83. BP in standing 104/82, pulse 89.  There is no height or weight on file to calculate BMI.   01/07/23: She starts with active range of motion for exercise as well as new measures, which shows improvement in all planes except for shoulder abduction.  She feels some pain to the lateral shoulder and some clicking, so OT upgrades her home exercise program to include "sleeper stretch," which after she performs helps alleviate this clicking and improve shoulder abduction.  Additionally she states continuing to have problems with her bra and donning a jacket.  When she uses a gown today to simulate donning a jacket, it is clear that her shoulder range of motion is limiting her overhead reach even when she learns adaptive donning technique.  She was recommended to get slightly oversized jacket and 1 with the more slick material that may help.  Additionally to help improve her strength in her shoulders OT introduces active assistive range of motion with a dowel and overhead.  As she performs that, she actually does better with each rep.  Lastly as she was "stuck" in her chair when trying to get up at 1 point, OT recommends using proprioceptive  feedback like stomping her feet or clapping her hands against her lap to encourage motion.  Other shoulder stretches were briefly reviewed today verbally as well.   Exercises - Seated Scapular Retraction  - 4 x daily - 5-10 reps - Seated Shoulder Flexion Towel Slide at Table Top  - 4 x daily - 3-5 reps - 15 hold - Seated Shoulder Abduction Towel Slide at Table Top  - 3-4 x daily - 3-5 reps - 15 hold - Seated Shoulder External Rotation PROM on Table  - 3-4 x daily - 3-5 reps - 15 sec hold - Standing neck/upper traps stretch  - 4-6 x daily - 3-5 reps - 15 sec hold - Engineer, building services  -  3-4 x daily - 3-5 reps - 15 hold - Seated Shoulder Flexion AAROM with Dowel  - 1-2 x daily - 1-2 sets - 10 reps   PATIENT EDUCATION: Education details: Educated on role and purpose of OT as well as potential interventions and goals for therapy based on initial evaluation findings. Person educated: Patient Education method: Explanation Education comprehension: verbalized understanding and needs further education  HOME EXERCISE PROGRAM: Access Code: [email or web address] URL: [email or web address] Date: 12/24/2022 Prepared by: Fannie Knee   GOALS: Goals reviewed with patient? Yes  SHORT TERM GOALS: Target date: 01/02/23  Pt will be independent with PD specific HEP. Baseline: needs plan Goal status: 01/02/23: MET-she states following along with LSVT exercises online at home.  2.  Pt will verbalize understanding of adapted strategies to maximize safety and I with ADLs/ IADLs. Baseline:  Goal status: 01/02/23: Progressing  3.  Pt will demonstrate improved fine motor coordination for ADLs as evidenced by decreasing 9 hole peg test score for RUE by 5 secs Baseline: 9 Hole Peg test: Right: 1:10.91 sec; Left: 57.75 sec Goal status: Met - R: 64.38 sec and L: 49.53 sec 01/14/23  4.  Pt will demonstrate improved grip strength as needed to mange clothing fasteners and open various household  containers. Baseline: grip strength 15# Rt, 23# Lt Goal status: 01/02/23: Progressing   LONG TERM GOALS: Target date: 01/30/23  Pt will write a short paragraph with 100% legibility and no significant decrease in letter size Baseline: 90% legibility with mild-mod decrease in size Goal status: INITIAL  2.  Pt will demonstrate improved ease with fastening buttons as evidenced by decreasing 3 button/ unbutton time to <60 seconds. Baseline:  3 buttons: 1:07.94 Goal status: INITIAL  3.  Pt will demonstrate improved fine motor coordination for ADLs as evidenced by decreasing 9 hole peg test score for RUE by 10 sec and LUE by 5 secs Baseline: 9 Hole Peg test: Right: 1:10.91 sec; Left: 57.75 sec Goal status: INITIAL  4.  Pt will demonstrate improved ease with feeding as evidenced by decreasing PPT#2 (self feeding) by 3 secs Baseline: 13.57 sec Goal status: INITIAL  5.  Pt will verbalize understanding of ways to prevent future PD related complications and PD community resources. Baseline:  Goal status: INITIAL  ASSESSMENT:  CLINICAL IMPRESSION: Pt continues to report difficulty with dressing, eating and [facility] skills. She reports more difficulty with right dominant UE than left non dominant UE, therefore session focused mostly on [facility] activities with small pegs and putty. Discussed use of wide grip and weighted utensils and reviewed UB dressing strategies to increase success as pt remains discouraged in these areas.  Pt encouraged to wear loose fitting shirts that allow for stretching with donning and doffing. She will research large grip and weighted utensils and utilize various diameters of foam rolls at home in the meantime and report back next session.    PLAN:  OT FREQUENCY: 1-2x/week  OT DURATION: 8 weeks  PLANNED INTERVENTIONS: self care/ADL training, therapeutic exercise, therapeutic activity, neuromuscular re-education, manual therapy, passive range of motion, balance training,  functional mobility training, ultrasound, compression bandaging, moist heat, cryotherapy, contrast bath, patient/family education, psychosocial skills training, energy conservation, coping strategies training, and DME and/or AE instructions  CONSULTED AND AGREED WITH PLAN OF CARE: Patient  PLAN FOR NEXT SESSION:  Continue working on functional skills and improving UE motion and functional ADL ability.  She states that she is in contact with her physician about her low blood pressure recently and  this is monitored by OT for safety during sessions. Just be aware it has been running low.  Plan to further address UB dressing as pt reports continued difficulty with this and self-feeding at upcoming session.   Charletta Cousin, Eular Panek Beth Dixon, OTR/L 01/20/2023, 12:20 PM

## 2023-01-27 ENCOUNTER — Other Ambulatory Visit: Payer: Self-pay | Admitting: Neurology

## 2023-01-28 ENCOUNTER — Ambulatory Visit: Payer: PPO | Admitting: Occupational Therapy

## 2023-01-28 DIAGNOSIS — R208 Other disturbances of skin sensation: Secondary | ICD-10-CM

## 2023-01-28 DIAGNOSIS — M6281 Muscle weakness (generalized): Secondary | ICD-10-CM

## 2023-01-28 DIAGNOSIS — R278 Other lack of coordination: Secondary | ICD-10-CM

## 2023-01-28 DIAGNOSIS — R29818 Other symptoms and signs involving the nervous system: Secondary | ICD-10-CM

## 2023-01-28 NOTE — Patient Instructions (Signed)
Large Amplitude Hands  Then, start with elbows bent and hands closed. PWR! Up: Close hands and flick fingers open and apart BIG PWR! Rock: Move wrists up and down [place]! Twist: Twist palms up and down BIG PWR! Step: Touch index finger to thumb while keeping other fingers straight. Flick fingers out BIG (thumb out/straighten fingers). Repeat with other fingers. (Step your thumb to each finger). PWR! Hands: Push hands out BIG. Elbows straight, wrists up, fingers open and spread apart BIG.    ** Make each movement big and deliberate so that you feel the movement.  Perform at least 10 repetitions 1x/day, but perform PWR! hands throughout the day when you are having trouble using your hands (picking up/manipulating small objects, writing, eating, typing, sewing, buttoning, etc.).

## 2023-01-28 NOTE — Therapy (Signed)
OUTPATIENT OCCUPATIONAL THERAPY PARKINSON'S TREATMENT NOTE  Patient Name: Ashley Neal MRN: [id number] DOB:12-08-1945, 77 y.o., female Today's Date: 01/28/2023  PCP: [name], NP REFERRING PROVIDER: [name], NP  END OF SESSION:  OT End of Session - 01/28/23 1550     Visit Number 8    Number of Visits 12    Date for OT Re-Evaluation 02/27/23    Authorization Type [employer]    OT Start Time 1450    OT Stop Time 1535    OT Time Calculation (min) 45 min    Equipment Utilized During Treatment yellow putty, small pegs and beads    Activity Tolerance Patient tolerated treatment well;Patient limited by pain;Patient limited by fatigue    Behavior During Therapy [facility] for tasks assessed/performed;Restless                Past Medical History:  Diagnosis Date   Anxiety    Basal cell carcinoma    Depression    Hypertension    Neuromuscular disorder (HCC)    Osteoarthritis    Osteopenia    Parkinson disease    Pneumonia    Tremors of nervous system    legs   uterine ca 10/08/2021   also endometrial cancer   Past Surgical History:  Procedure Laterality Date   CARPAL TUNNEL RELEASE Bilateral 1996/1997   CYSTOSCOPY N/A 10/08/2021   Procedure: CYSTOSCOPY;  Surgeon: Carver Fila, MD;  Location: [facility];  Service: Gynecology;  Laterality: N/A;   ROBOTIC ASSISTED TOTAL HYSTERECTOMY WITH BILATERAL SALPINGO OOPHERECTOMY N/A 10/08/2021   Procedure: XI ROBOTIC ASSISTED TOTAL HYSTERECTOMY WITH BILATERAL SALPINGO OOPHORECTOMY;SENTINEL LYMPH NODE INJECTION;  Surgeon: Carver Fila, MD;  Location: [facility];  Service: Gynecology;  Laterality: N/A;   SQUAMOUS CELL CARCINOMA EXCISION Left 2017   Left jawline   TUBAL LIGATION  1980's   Patient Active Problem List   Diagnosis Date Noted   Endometrial cancer (HCC) 10/16/2021   Complex atypical endometrial hyperplasia 09/27/2021   Primary osteoarthritis of both hands 05/14/2020   Primary  osteoarthritis of both feet 05/14/2020   Age-related osteoporosis without current pathological fracture 05/14/2020   Prediabetes 05/14/2020   Essential hypertension 05/14/2020   History of hyperlipidemia 05/14/2020   History of squamous cell carcinoma 05/14/2020   Parkinsonism 05/14/2020    ONSET DATE: referral date 11/14/22  REFERRING DIAG: G20.A1 (ICD-10-CM) - Parkinson's disease, unspecified whether dyskinesia present, unspecified whether manifestations fluctuate M54.16 (ICD-10-CM) - Lumbar radiculopathy  THERAPY DIAG:  Muscle weakness (generalized)  Other lack of coordination  Other symptoms and signs involving the nervous system  Other disturbances of skin sensation  Rationale for Evaluation and Treatment: Rehabilitation  PERTINENT HISTORY: PD, HTN, osteoarthritis, chronic back pain, depression "I hate Parkinson's"  Pt reports that her mother's sister and pt's brother both had PD.  Pt reports past 2 years have been awful; with onset of back pain, uterine cancer s/p hysterectomy and lymph node removal, and persistent back ache.  Pt reports that she has difficulty with managing buttons, feeding herself (especially cutting).  Pt reports she has been going to a balance class at the senior center.    PRECAUTIONS: Fall ; WEIGHT BEARING RESTRICTIONS: No   SUBJECTIVE:   SUBJECTIVE STATEMENT: Pt reports pain in her coccyx and sitting is a challenge but unable to maintain prolonged standing due to low BP.   PAIN:  Are you having pain?   Yes: NPRS scale: 4-5/10 (chronic pain)  Pain location: pain in pelvis  and lower back pain Pain description: radiating Aggravating factors: sitting Relieving factors: medication, xanax worked (but they are not prescribing it to her anymore)  FALLS: Has patient fallen in last 6 months? Yes. 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   PATIENT GOALS: to be able to maintain typical living skills to remain in home   OBJECTIVE: (All objective assessments below are from initial evaluation on: 12/04/22 unless otherwise specified.)   HAND DOMINANCE: Right  ADLs: Transfers/ambulation related to ADLs: Independent Eating: typically will eat hand held items, difficulty with cutting foods Grooming: difficulty with styling hair UB Dressing: buttons are challenging, fastens bra in front LB Dressing: typically will lean up against something and complete in standing Toileting: Mod I, grab bar next to toilet Bathing: Mod I, has shower chair but doesn't use it Tub Shower transfers: had tub/shower remodeled to walk in shower Equipment: Shower seat with back, Grab bars, Walk in shower, Reacher, and Long handled shoe horn  IADLs: Shopping: order online, son will pick up for her Light housekeeping: reports that she can't clean as well as she used to Meal Prep: makes simple meals Community mobility: still driving Medication management: easy off caps, uses phone as reminder to take Handwriting: 90% legible, Increased time, Moderate micrographia, and PPT #1: 14.59 ([place])  MOBILITY STATUS: Hx of falls  POSTURE COMMENTS:  rounded shoulders  ACTIVITY TOLERANCE: Activity tolerance: WFL for tasks assessed on eval  FUNCTIONAL OUTCOME MEASURES:  12/17/22: Grip strength measures: Rt: 15#; Lt: 23#  Eval: Fastening/unfastening 3 buttons: 1:07.94 Physical performance test: PPT#2 (simulated eating) 13.57 sec & PPT#4 (donning/doffing jacket): 13.37sec (12/17/22)  COORDINATION: 9 Hole Peg test: Right: 1:10.91 sec; Left: 57.75  sec 01/14/23 R: 64.38 sec and L: 49.53 sec Box and Blocks:  Right 48 blocks, Left 46 blocks Tremors: Resting and Right  UE ROM:   01/07/23: Rt sh flex: 116*, Abd: 82*, ER: 75*, IR: 46*   12/24/22: Rt Sh:   Flexion:  100*, Abd: 105*, ER: 46* tender, IR 44*   UE MMT:   WFL  SENSATION: Decreased sensation in finger tips in B hands  COGNITION: Overall cognitive status: Within functional limits for tasks assessed  OBSERVATIONS: Bradykinesia   TODAY'S TREATMENT:                      01/28/23 9 hole peg test: R:59.28 sec and L: 38.82 sec. OT educated on use of wash cloth underneath to provide texture to increase ease with picking up small items.  Completed with towel under pegs with pt demonstrating significant improvements, completing in 30.90 sec.  Reviewed recommendation for use of colored wash cloth when administering medications. Self-feeding: 14.03 sec, completed again with built up handle: 13.65 sec.  Pt reports handles from last week are too big for utensils at home. Buttons: 107.84 sec, OT educated on use of button hook, pt then completed again with button hook: 30.97 sec.  Pt reports typically wearing pull over shirts in the summer, but plans to purchase button hook for the fall/winter. Large amplitude: OT educated on large amplitude hand exercises and  completing throughout the day, particularly before completing any tasks requiring fine motor movements, ie buttons, handwriting, medications.  Pt returning demonstration, OT noticing stiffness with forearm supination and wrist flexion/extension, and thumb opposition.   01/20/23: BP in sitting 129/89, pulse 83. BP in standing 104/82, pulse 89. Coordination: Engaged in coordination activity using small pegs and glass/marbles in yellow putty to remove and then placing in cup, right with moderate-maximal difficulty and left with minimal-moderate difficulty noted today. ADL's: Discussed using built up handles and possibly weighted utensils for  ADL's and self feeding. Issued 3 different sizes of cylindrical foam and educated pt in use on utensils, toothbrush, hair brush prior to purchasing weight utensils online. Also discussed use of wide grip pens and battery operated toothbrushes as they tend to have wider grip surface area to assist with ease of use. Reviewed PWR & home program as pt reports having tremors (that she notes increase in the evenings) and not having walked much since last week due to not feeling well. Pt reports continued difficulty with donning and doffing shirts using shirt arm, arm, head technique. Pt states that her son needed to assist her in removing a shirt the other day. Will benefit from further discussion and trial and error in clinic setting to maximize independence with this. Recommended using looser fitting shirts and stretchy material vs tighter/well fitting. Pt verbalized understanding of this in the clinic today.                                                          01/14/23 Coordination: engaged in bimanual task of lacing/unlacing with focus on coordination and motor control.  Pt demonstrating slowed movements bilaterally.   9 hole peg test: R: 64.38 sec and L: 49.53 sec Simulated dressing: pt reports difficulty dressing/undressing especially with UB dressing.  Pt reports getting caught in shirt when undressing.  OT educated on donning shirt arm - arm- head (reverse to remove) with recommendations to pull sleeves over elbows to reduce risk of getting caught or having to pull up sleeves afterwards.  Pt completing simulated dressing with use of gait belt with improved ease, however difficult to assess due to not same material or size as shirt.  OT provided demonstration with donning/doffing with sample shirt and provided pt with handout from OT toolkit.  BP in sitting 129/89, pulse 83. BP in standing 104/82, pulse 89.    PATIENT EDUCATION: Education details: ongoing condition specific education Person  educated: Patient Education method: Explanation Education comprehension: verbalized understanding and needs further education  HOME EXERCISE PROGRAM: Access Code: [email or web address] URL: [email or web address] Date: 12/24/2022 Prepared by: Fannie Knee   GOALS: Goals reviewed with patient? Yes  SHORT TERM GOALS: Target date: 01/02/23  Pt will be independent with PD specific HEP. Baseline: needs plan Goal status: 01/02/23: MET-she states following along with LSVT exercises online at home.  2.  Pt will verbalize understanding of adapted strategies to maximize safety and I with ADLs/ IADLs. Baseline:  Goal status: 01/02/23: Progressing  3.  Pt will demonstrate improved fine motor coordination for ADLs as evidenced by decreasing 9 hole peg test score for RUE by 5 secs Baseline: 9 Hole Peg test: Right: 1:10.91 sec; Left: 57.75 sec Goal status: Met - R: 64.38 sec and L: 49.53 sec 01/14/23  4.  Pt will  demonstrate improved grip strength as needed to mange clothing fasteners and open various household containers. Baseline: grip strength 15# Rt, 23# Lt Goal status: 01/02/23: Progressing   LONG TERM GOALS: Target date: 01/30/23  Pt will write a short paragraph with 100% legibility and no significant decrease in letter size Baseline: 90% legibility with mild-mod decrease in size Goal status: INITIAL  2.  Pt will demonstrate improved ease with fastening buttons as evidenced by decreasing 3 button/ unbutton time to <60 seconds. Baseline:  3 buttons: 1:07.94 Goal status: MET - 30.97 sec on 01/28/23  3.  Pt will demonstrate improved fine motor coordination for ADLs as evidenced by decreasing 9 hole peg test score for RUE by 10 sec and LUE by 5 secs Baseline: 9 Hole Peg test: Right: 1:10.91 sec; Left: 57.75 sec Goal status: MET - R:59.28 sec and L: 38.82 sec on 01/28/23  4.  Pt will demonstrate improved ease with feeding as evidenced by decreasing PPT#2 (self feeding) by 3 secs Baseline:  13.57 sec Goal status: INITIAL  5.  Pt will verbalize understanding of ways to prevent future PD related complications and PD community resources. Baseline:  Goal status: INITIAL  NEW LONG TERM GOALS: Target date: 02/27/23  Pt will write a short paragraph with 100% legibility and no significant decrease in letter size Baseline: 90% legibility with mild-mod decrease in size Goal status: INITIAL  2. Pt will verbalize understanding of adapted strategies to maximize safety and I with ADLs/ IADLs. Baseline:  Goal status: INITIAL  3.  Pt will demonstrate improved ease with feeding as evidenced by decreasing PPT#2 (self feeding) by 3 secs Baseline: 13.57 sec Goal status: INITIAL  4.  Pt will verbalize understanding of ways to prevent future PD related complications and PD community resources. Baseline:  Goal status: INITIAL  ASSESSMENT:  CLINICAL IMPRESSION: Pt continues to report difficulty with dressing, eating and [facility] skills. Discussed use of button hook for increased ease with fastening buttons, wash cloth or shelf liner under medications and other small items to increase success with picking up from table top, and large amplitude exercises prior to engaging in [facility] tasks.  Pt plans to bring in a tighter shirt next session to assess doffing as pt reports she has been more successful with loose fitting shirts, but still difficulty with majority of shirts.     PLAN:  OT FREQUENCY: 1-2x/week  OT DURATION: 8 weeks  PLANNED INTERVENTIONS: self care/ADL training, therapeutic exercise, therapeutic activity, neuromuscular re-education, manual therapy, passive range of motion, balance training, functional mobility training, ultrasound, compression bandaging, moist heat, cryotherapy, contrast bath, patient/family education, psychosocial skills training, energy conservation, coping strategies training, and DME and/or AE instructions  CONSULTED AND AGREED WITH PLAN OF CARE: Patient  PLAN FOR  NEXT SESSION:  Continue working on functional skills and improving UE motion and functional ADL ability.  She states that she is in contact with her physician about her low blood pressure recently and this is monitored by OT for safety during sessions. Just be aware it has been running low.  Plan to further address UB dressing as pt reports continued difficulty with this and self-feeding at upcoming session. Handwriting, coordination, and large amplitude HEP.   Rosalio Loud, OTR/L 01/28/2023, 3:51 PM

## 2023-02-04 ENCOUNTER — Ambulatory Visit: Payer: PPO | Admitting: Occupational Therapy

## 2023-02-04 DIAGNOSIS — R278 Other lack of coordination: Secondary | ICD-10-CM

## 2023-02-04 DIAGNOSIS — R29818 Other symptoms and signs involving the nervous system: Secondary | ICD-10-CM

## 2023-02-04 DIAGNOSIS — M6281 Muscle weakness (generalized): Secondary | ICD-10-CM

## 2023-02-04 NOTE — Telephone Encounter (Signed)
I called the pt and LVM asking for call back tomorrow during office hours to discuss refill request.

## 2023-02-04 NOTE — Telephone Encounter (Signed)
Office visit from Dr Lysle Rubens w/ [facility]:   We will have to call the patient to discuss if she is returning to [place] vs staying with [facility]. She has a follow-up scheduled with him for next month.

## 2023-02-04 NOTE — Therapy (Signed)
OUTPATIENT OCCUPATIONAL THERAPY PARKINSON'S TREATMENT NOTE  Patient Name: Ashley Neal MRN: [id number] DOB:1946-04-08, 77 y.o., female Today's Date: 02/04/2023  PCP: [name], NP REFERRING PROVIDER: [name], NP  END OF SESSION:  OT End of Session - 02/04/23 1503     Visit Number 9    Number of Visits 12    Date for OT Re-Evaluation 02/27/23    Authorization Type [employer]    OT Start Time 1454   arrival time   OT Stop Time 1530    OT Time Calculation (min) 36 min    Equipment Utilized During Treatment yellow putty, small pegs and beads    Activity Tolerance Patient tolerated treatment well;Patient limited by pain;Patient limited by fatigue    Behavior During Therapy [facility] for tasks assessed/performed;Restless                 Past Medical History:  Diagnosis Date   Anxiety    Basal cell carcinoma    Depression    Hypertension    Neuromuscular disorder (HCC)    Osteoarthritis    Osteopenia    Parkinson disease    Pneumonia    Tremors of nervous system    legs   uterine ca 10/08/2021   also endometrial cancer   Past Surgical History:  Procedure Laterality Date   CARPAL TUNNEL RELEASE Bilateral 1996/1997   CYSTOSCOPY N/A 10/08/2021   Procedure: CYSTOSCOPY;  Surgeon: Carver Fila, MD;  Location: [facility];  Service: Gynecology;  Laterality: N/A;   ROBOTIC ASSISTED TOTAL HYSTERECTOMY WITH BILATERAL SALPINGO OOPHERECTOMY N/A 10/08/2021   Procedure: XI ROBOTIC ASSISTED TOTAL HYSTERECTOMY WITH BILATERAL SALPINGO OOPHORECTOMY;SENTINEL LYMPH NODE INJECTION;  Surgeon: Carver Fila, MD;  Location: [facility];  Service: Gynecology;  Laterality: N/A;   SQUAMOUS CELL CARCINOMA EXCISION Left 2017   Left jawline   TUBAL LIGATION  1980's   Patient Active Problem List   Diagnosis Date Noted   Endometrial cancer (HCC) 10/16/2021   Complex atypical endometrial hyperplasia 09/27/2021   Primary osteoarthritis of both hands 05/14/2020    Primary osteoarthritis of both feet 05/14/2020   Age-related osteoporosis without current pathological fracture 05/14/2020   Prediabetes 05/14/2020   Essential hypertension 05/14/2020   History of hyperlipidemia 05/14/2020   History of squamous cell carcinoma 05/14/2020   Parkinsonism 05/14/2020    ONSET DATE: referral date 11/14/22  REFERRING DIAG: G20.A1 (ICD-10-CM) - Parkinson's disease, unspecified whether dyskinesia present, unspecified whether manifestations fluctuate M54.16 (ICD-10-CM) - Lumbar radiculopathy  THERAPY DIAG:  Muscle weakness (generalized)  Other lack of coordination  Other symptoms and signs involving the nervous system  Rationale for Evaluation and Treatment: Rehabilitation  PERTINENT HISTORY: PD, HTN, osteoarthritis, chronic back pain, depression "I hate Parkinson's"  Pt reports that her mother's sister and pt's brother both had PD.  Pt reports past 2 years have been awful; with onset of back pain, uterine cancer s/p hysterectomy and lymph node removal, and persistent back ache.  Pt reports that she has difficulty with managing buttons, feeding herself (especially cutting).  Pt reports she has been going [facility] a [facility] center.    PRECAUTIONS: Fall ; WEIGHT BEARING RESTRICTIONS: No   SUBJECTIVE:   SUBJECTIVE STATEMENT: Pt states "I had an idea, puzzles must be good for your hands."   PAIN:  Are you having pain?   Yes: NPRS scale: 4-5/10 (chronic pain)  Pain location: pain in pelvis and lower back pain Pain description: radiating Aggravating factors: sitting  Relieving factors: medication, xanax worked (but they are not prescribing it to her anymore)  FALLS: Has patient fallen in last 6 months? Yes. 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   PATIENT GOALS: to be able to maintain typical living skills to remain in home   OBJECTIVE: (All objective assessments below are from initial evaluation on: 12/04/22 unless otherwise specified.)   HAND DOMINANCE: Right  ADLs: Transfers/ambulation related to ADLs: Independent Eating: typically will eat hand held items, difficulty with cutting foods Grooming: difficulty with styling hair UB Dressing: buttons are challenging, fastens bra in front LB Dressing: typically will lean up against something and complete in standing Toileting: Mod I, grab bar next to toilet Bathing: Mod I, has shower chair but doesn't use it Tub Shower transfers: had tub/shower remodeled to walk in shower Equipment: Shower seat with back, Grab bars, Walk in shower, Reacher, and Long handled shoe horn  IADLs: Shopping: order online, son will pick up for her Light housekeeping: reports that she can't clean as well as she used to Meal Prep: makes simple meals Community mobility: still driving Medication management: easy off caps, uses phone as reminder to take Handwriting: 90% legible, Increased time, Moderate micrographia, and PPT #1: 14.59 ([place])  MOBILITY STATUS: Hx of falls  POSTURE COMMENTS:  rounded shoulders  ACTIVITY TOLERANCE: Activity tolerance: WFL for tasks assessed on eval  FUNCTIONAL OUTCOME MEASURES:  12/17/22: Grip strength measures: Rt: 15#; Lt: 23#  Eval: Fastening/unfastening 3 buttons: 1:07.94 Physical performance test: PPT#2 (simulated eating) 13.57 sec & PPT#4 (donning/doffing jacket): 13.37sec (12/17/22)  COORDINATION: 9 Hole Peg test: Right: 1:10.91 sec; Left: 57.75 sec 01/14/23 R: 64.38 sec and L: 49.53 sec Box and Blocks:  Right 48 blocks, Left 46  blocks Tremors: Resting and Right  UE ROM:   01/07/23: Rt sh flex: 116*, Abd: 82*, ER: 75*, IR: 46*   12/24/22: Rt Sh:   Flexion:  100*, Abd: 105*, ER: 46* tender, IR 44*   UE MMT:   WFL  SENSATION: Decreased sensation in finger tips in B hands  COGNITION: Overall cognitive status: Within functional limits for tasks assessed  OBSERVATIONS: Bradykinesia   TODAY'S TREATMENT:                      02/04/23 Puzzle: engaged in flipping over of puzzle pieces with focus on exaggerated, large amplitude movements.   Handwriting: provided with handout of recommendations from [facility] to aid in legibility with handwriting. Trialed multiple suggestions with focus on printing, slowing down writing, and use of built up handle/pen grip. Discussed purchasing pen/pencil grips to aid in keeping hand more relaxed during writing. Pt with increased tremor this session impacting handwriting.   01/28/23 9 hole peg test: R:59.28 sec and L: 38.82 sec. OT educated on use of wash cloth underneath to provide texture to increase ease with picking up small items.  Completed with towel under pegs with pt demonstrating significant improvements, completing in 30.90 sec.  Reviewed recommendation for use of colored wash cloth when administering medications. Self-feeding: 14.03 sec, completed again with built up handle: 13.65 sec.  Pt reports handles from last week are too big for utensils at home. Buttons: 107.84 sec, OT educated on use of button hook, pt then completed again with button hook: 30.97 sec.  Pt reports typically wearing pull over shirts in the summer, but plans to purchase button hook for the fall/winter. Large amplitude: OT educated on large amplitude hand exercises and completing throughout the day, particularly before completing any tasks requiring fine motor movements, ie buttons, handwriting, medications.  Pt returning demonstration, OT noticing stiffness with forearm supination and wrist  flexion/extension, and thumb opposition.   01/20/23: BP in sitting 129/89, pulse 83. BP in standing 104/82, pulse 89. Coordination: Engaged in coordination activity using small pegs and glass/marbles in yellow putty to remove and then placing in cup, right with moderate-maximal difficulty and left with minimal-moderate difficulty noted today. ADL's: Discussed using built up handles and possibly weighted utensils for ADL's and self feeding. Issued 3 different sizes of cylindrical foam and educated pt in use on utensils, toothbrush, hair brush prior to purchasing weight utensils online. Also discussed use of wide grip pens and battery operated toothbrushes as they tend to have wider grip surface area to assist with ease of use. Reviewed [facility] & home program as pt reports having tremors (that she notes increase in the evenings) and not having walked much since last week due to not feeling well. Pt reports continued difficulty with donning and doffing shirts using shirt arm, arm, head technique. Pt states that her son needed to assist her in removing a shirt the other day. Will benefit from further discussion and trial and error in clinic setting to maximize independence with this. Recommended using looser fitting shirts and stretchy material vs tighter/well fitting. Pt verbalized understanding of this in the clinic today.                                                          PATIENT EDUCATION: Education details: ongoing condition specific education Person educated: Patient Education method: Explanation Education comprehension: verbalized understanding and needs further education  HOME EXERCISE PROGRAM: Access Code: [email or web address] URL: [email or web address] Date: 12/24/2022 Prepared by: Fannie Knee   GOALS: Goals reviewed with patient? Yes  SHORT TERM GOALS: Target date: 01/02/23  Pt will be independent with PD specific HEP. Baseline: needs plan Goal status: 01/02/23: MET-she states  following along with LSVT exercises online at home.  2.  Pt will verbalize understanding of adapted strategies to maximize safety and I with ADLs/ IADLs. Baseline:  Goal status: 01/02/23: Progressing  3.  Pt will demonstrate improved fine motor coordination for ADLs as evidenced by decreasing 9 hole peg test score for RUE by 5 secs Baseline: 9 Hole Peg test: Right: 1:10.91 sec; Left: 57.75 sec Goal status: Met - R: 64.38 sec and L: 49.53 sec 01/14/23  4.  Pt will demonstrate improved grip strength as needed to mange clothing fasteners and open various household containers. Baseline: grip strength 15# Rt, 23# Lt Goal status: 01/02/23: Progressing    LONG TERM GOALS: Target date: 02/27/23  Pt will write a short paragraph with 100% legibility and no significant decrease in letter size Baseline: 90% legibility with mild-mod decrease in size Goal status: IN PROGRESS  2. Pt will verbalize understanding of adapted strategies to maximize safety and I with ADLs/ IADLs. Baseline:  Goal status: IN PROGRESS  3.  Pt will demonstrate improved ease with feeding as evidenced by decreasing PPT#2 (self feeding) by 3 secs Baseline: 13.57 sec Goal status: IN PROGRESS  4.  Pt will verbalize understanding of ways to prevent future PD related complications and PD community resources. Baseline:  Goal status: IN PROGRESS  ASSESSMENT:  CLINICAL IMPRESSION: Engaged in discussion about large amplitude movements with fine and gross motor tasks, incorporating finger flicks and exaggerated movements with table top and functional reach.  Pt tearful throughout session due to increased pain in back and coccyx, discussed previous medical interventions and pt voicing frustrations with persistent pain. Tremors more present in RUE and RLE this session, taking PD meds at onset of session.  Pt very down throughout session about pain as well as difficulty with dressing tasks.  Pt reports plans to go through clothing and get  rid of items that are too tight/don't fit that increase her challenges with removing.   PLAN:  OT FREQUENCY: 1-2x/week  OT DURATION: 8 weeks  PLANNED INTERVENTIONS: self care/ADL training, therapeutic exercise, therapeutic activity, neuromuscular re-education, manual therapy, passive range of motion, balance training, functional mobility training, ultrasound, compression bandaging, moist heat, cryotherapy, contrast bath, patient/family education, psychosocial skills training, energy conservation, coping strategies training, and DME and/or AE instructions  CONSULTED AND AGREED WITH PLAN OF CARE: Patient  PLAN FOR NEXT SESSION:  Continue working on functional skills and improving UE motion and functional ADL ability.  She states that she is in contact with her physician about her low blood pressure recently and this is monitored by OT for safety during sessions. Just be aware it has been running low.  Plan to further address UB dressing as pt reports continued difficulty with this and self-feeding at upcoming session. Handwriting, coordination, and large amplitude HEP.  F/u regarding back pain.   Rosalio Loud, OTR/L 02/04/2023, 3:04 PM

## 2023-02-10 ENCOUNTER — Ambulatory Visit: Payer: PPO | Attending: Nurse Practitioner | Admitting: Occupational Therapy

## 2023-02-10 ENCOUNTER — Telehealth: Payer: Self-pay | Admitting: *Deleted

## 2023-02-10 DIAGNOSIS — R278 Other lack of coordination: Secondary | ICD-10-CM | POA: Diagnosis not present

## 2023-02-10 DIAGNOSIS — R29818 Other symptoms and signs involving the nervous system: Secondary | ICD-10-CM

## 2023-02-10 DIAGNOSIS — R1313 Dysphagia, pharyngeal phase: Secondary | ICD-10-CM | POA: Diagnosis not present

## 2023-02-10 DIAGNOSIS — M6281 Muscle weakness (generalized): Secondary | ICD-10-CM

## 2023-02-10 DIAGNOSIS — R1310 Dysphagia, unspecified: Secondary | ICD-10-CM | POA: Insufficient documentation

## 2023-02-10 NOTE — Therapy (Signed)
OUTPATIENT OCCUPATIONAL THERAPY PARKINSON'S TREATMENT NOTE  Patient Name: Ashley Neal MRN: [id number] DOB:April 10, 1946, 77 y.o., female Today's Date: 02/10/2023  PCP: [name], NP REFERRING PROVIDER: [name], NP  END OF SESSION:  OT End of Session - 02/10/23 1029     Visit Number 10    Number of Visits 12    Date for OT Re-Evaluation 02/27/23    Authorization Type [employer]    OT Start Time 1021    OT Stop Time 1101    OT Time Calculation (min) 40 min    Equipment Utilized During Treatment yellow putty, small pegs and beads    Activity Tolerance Patient tolerated treatment well;Patient limited by pain;Patient limited by fatigue    Behavior During Therapy [facility] for tasks assessed/performed;Restless                  Past Medical History:  Diagnosis Date   Anxiety    Basal cell carcinoma    Depression    Hypertension    Neuromuscular disorder (HCC)    Osteoarthritis    Osteopenia    Parkinson disease    Pneumonia    Tremors of nervous system    legs   uterine ca 10/08/2021   also endometrial cancer   Past Surgical History:  Procedure Laterality Date   CARPAL TUNNEL RELEASE Bilateral 1996/1997   CYSTOSCOPY N/A 10/08/2021   Procedure: CYSTOSCOPY;  Surgeon: Carver Fila, MD;  Location: [facility];  Service: Gynecology;  Laterality: N/A;   ROBOTIC ASSISTED TOTAL HYSTERECTOMY WITH BILATERAL SALPINGO OOPHERECTOMY N/A 10/08/2021   Procedure: XI ROBOTIC ASSISTED TOTAL HYSTERECTOMY WITH BILATERAL SALPINGO OOPHORECTOMY;SENTINEL LYMPH NODE INJECTION;  Surgeon: Carver Fila, MD;  Location: [facility];  Service: Gynecology;  Laterality: N/A;   SQUAMOUS CELL CARCINOMA EXCISION Left 2017   Left jawline   TUBAL LIGATION  1980's   Patient Active Problem List   Diagnosis Date Noted   Endometrial cancer (HCC) 10/16/2021   Complex atypical endometrial hyperplasia 09/27/2021   Primary osteoarthritis of both hands 05/14/2020   Primary  osteoarthritis of both feet 05/14/2020   Age-related osteoporosis without current pathological fracture 05/14/2020   Prediabetes 05/14/2020   Essential hypertension 05/14/2020   History of hyperlipidemia 05/14/2020   History of squamous cell carcinoma 05/14/2020   Parkinsonism 05/14/2020    ONSET DATE: referral date 11/14/22  REFERRING DIAG: G20.A1 (ICD-10-CM) - Parkinson's disease, unspecified whether dyskinesia present, unspecified whether manifestations fluctuate M54.16 (ICD-10-CM) - Lumbar radiculopathy  THERAPY DIAG:  Muscle weakness (generalized)  Other lack of coordination  Other symptoms and signs involving the nervous system  Rationale for Evaluation and Treatment: Rehabilitation  PERTINENT HISTORY: PD, HTN, osteoarthritis, chronic back pain, depression "I hate Parkinson's"  Pt reports that her mother's sister and pt's brother both had PD.  Pt reports past 2 years have been awful; with onset of back pain, uterine cancer s/p hysterectomy and lymph node removal, and persistent back ache.  Pt reports that she has difficulty with managing buttons, feeding herself (especially cutting).  Pt reports she has been going to a balance class at the senior center.    PRECAUTIONS: Fall ; WEIGHT BEARING RESTRICTIONS: No   SUBJECTIVE:   SUBJECTIVE STATEMENT: Pt reports that she purchased a few 25 piece jigsaw puzzles.   PAIN:  Are you having pain?   Yes: NPRS scale: 4-5/10 (chronic pain)  Pain location: pain in pelvis and lower back pain Pain description: radiating Aggravating factors: sitting Relieving factors: medication, xanax  worked (but they are not prescribing it to her anymore)  FALLS: Has patient fallen in last 6 months? Yes. 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   PATIENT GOALS: to be able to maintain typical living skills to remain in home   OBJECTIVE: (All objective assessments below are from initial evaluation on: 12/04/22 unless otherwise specified.)   HAND DOMINANCE: Right  ADLs: Transfers/ambulation related to ADLs: Independent Eating: typically will eat hand held items, difficulty with cutting foods Grooming: difficulty with styling hair UB Dressing: buttons are challenging, fastens bra in front LB Dressing: typically will lean up against something and complete in standing Toileting: Mod I, grab bar next to toilet Bathing: Mod I, has shower chair but doesn't use it Tub Shower transfers: had tub/shower remodeled to walk in shower Equipment: Shower seat with back, Grab bars, Walk in shower, Reacher, and Long handled shoe horn  IADLs: Shopping: order online, son will pick up for her Light housekeeping: reports that she can't clean as well as she used to Meal Prep: makes simple meals Community mobility: still driving Medication management: easy off caps, uses phone as reminder to take Handwriting: 90% legible, Increased time, Moderate micrographia, and PPT #1: 14.59 ([place])  MOBILITY STATUS: Hx of falls  POSTURE COMMENTS:  rounded shoulders  ACTIVITY TOLERANCE: Activity tolerance: WFL for tasks assessed on eval  FUNCTIONAL OUTCOME MEASURES:  12/17/22: Grip strength measures: Rt: 15#; Lt: 23#  Eval: Fastening/unfastening 3 buttons: 1:07.94 Physical performance test: PPT#2 (simulated eating) 13.57 sec & PPT#4 (donning/doffing jacket): 13.37sec (12/17/22)  COORDINATION: 9 Hole Peg test: Right: 1:10.91 sec; Left: 57.75 sec 01/14/23 R: 64.38 sec and L: 49.53 sec Box and Blocks:  Right 48 blocks, Left 46 blocks Tremors:  Resting and Right  UE ROM:   01/07/23: Rt sh flex: 116*, Abd: 82*, ER: 75*, IR: 46*   12/24/22: Rt Sh:   Flexion:  100*, Abd: 105*, ER: 46* tender, IR 44*   UE MMT:   WFL  SENSATION: Decreased sensation in finger tips in B hands  COGNITION: Overall cognitive status: Within functional limits for tasks assessed  OBSERVATIONS: Bradykinesia   TODAY'S TREATMENT:                      02/10/23 Large amplitude: engaged in card flipping with focus on large amplitude movement with supination and hand opening.  OT providing initial demonstration and min cues as fatigue set in to continue with forearm rotation and opening of hand to release card onto stack.   Self-feeding: engaged in scooping of beans from bowl with focus on motor control.  OT educated on modified hand placement to facilitate increased motor control and decrease of tremors with increased proximity to bowl of spoon and intermittent squeezes on utensil to keep activity active.  Pt completed PPT #2: 10.87 sec with minimal tremors during task this session.   02/04/23 Puzzle: engaged in flipping over of puzzle pieces with focus on exaggerated, large amplitude movements.   Handwriting: provided with handout of recommendations from [facility] to aid in legibility with handwriting. Trialed multiple suggestions with focus  on printing, slowing down writing, and use of built up handle/pen grip. Discussed purchasing pen/pencil grips to aid in keeping hand more relaxed during writing. Pt with increased tremor this session impacting handwriting.   01/28/23 9 hole peg test: R:59.28 sec and L: 38.82 sec. OT educated on use of wash cloth underneath to provide texture to increase ease with picking up small items.  Completed with towel under pegs with pt demonstrating significant improvements, completing in 30.90 sec.  Reviewed recommendation for use of colored wash cloth when administering medications. Self-feeding: 14.03 sec, completed again  with built up handle: 13.65 sec.  Pt reports handles from last week are too big for utensils at home. Buttons: 107.84 sec, OT educated on use of button hook, pt then completed again with button hook: 30.97 sec.  Pt reports typically wearing pull over shirts in the summer, but plans to purchase button hook for the fall/winter. Large amplitude: OT educated on large amplitude hand exercises and completing throughout the day, particularly before completing any tasks requiring fine motor movements, ie buttons, handwriting, medications.  Pt returning demonstration, OT noticing stiffness with forearm supination and wrist flexion/extension, and thumb opposition.                                                          PATIENT EDUCATION: Education details: ongoing condition specific education, provided with handouts on community PD programs Person educated: Patient Education method: Explanation and Handouts Education comprehension: verbalized understanding and needs further education  HOME EXERCISE PROGRAM: Access Code: [email or web address] URL: [email or web address] Date: 12/24/2022 Prepared by: Fannie Knee   GOALS: Goals reviewed with patient? Yes  SHORT TERM GOALS: Target date: 01/02/23  Pt will be independent with PD specific HEP. Baseline: needs plan Goal status: 01/02/23: MET-she states following along with LSVT exercises online at home.  2.  Pt will verbalize understanding of adapted strategies to maximize safety and I with ADLs/ IADLs. Baseline:  Goal status: 01/02/23: Progressing  3.  Pt will demonstrate improved fine motor coordination for ADLs as evidenced by decreasing 9 hole peg test score for RUE by 5 secs Baseline: 9 Hole Peg test: Right: 1:10.91 sec; Left: 57.75 sec Goal status: Met - R: 64.38 sec and L: 49.53 sec 01/14/23  4.  Pt will demonstrate improved grip strength as needed to mange clothing fasteners and open various household containers. Baseline: grip strength 15#  Rt, 23# Lt Goal status: 01/02/23: Progressing    LONG TERM GOALS: Target date: 02/27/23  Pt will write a short paragraph with 100% legibility and no significant decrease in letter size Baseline: 90% legibility with mild-mod decrease in size Goal status: IN PROGRESS  2. Pt will verbalize understanding of adapted strategies to maximize safety and I with ADLs/ IADLs. Baseline:  Goal status: IN PROGRESS  3.  Pt will demonstrate improved ease with feeding as evidenced by decreasing PPT#2 (self feeding) by 3 secs Baseline: 13.57 sec Goal status: IN PROGRESS  4.  Pt will verbalize understanding of ways to prevent future PD related complications and PD community resources. Baseline:  Goal status: IN PROGRESS  ASSESSMENT:  CLINICAL IMPRESSION: Engaged in discussion about large amplitude movements with fine and gross motor tasks, incorporating finger flicks and exaggerated movements with table top and functional reach.  Pt demonstrating improved motor quality of simulated self-feeding  task and with card flipping this session.  Pt continues to report frustration with ongoing back pain and fears of further complications as PD progresses.  OT provided pt with information on local PD programming with support group, events, and classes in the community.      PLAN:  OT FREQUENCY: 1-2x/week  OT DURATION: 8 weeks  PLANNED INTERVENTIONS: self care/ADL training, therapeutic exercise, therapeutic activity, neuromuscular re-education, manual therapy, passive range of motion, balance training, functional mobility training, ultrasound, compression bandaging, moist heat, cryotherapy, contrast bath, patient/family education, psychosocial skills training, energy conservation, coping strategies training, and DME and/or AE instructions  CONSULTED AND AGREED WITH PLAN OF CARE: Patient  PLAN FOR NEXT SESSION:  Continue working on functional skills and improving UE motion and functional ADL ability.  She states  that she is in contact with her physician about her low blood pressure recently and this is monitored by OT for safety during sessions. Just be aware it has been running low.  Plan to further address UB dressing as pt reports continued difficulty with this and self-feeding at upcoming session. Handwriting, coordination, and large amplitude HEP.  F/u regarding back pain.   Rosalio Loud, OTR/L 02/10/2023, 10:29 AM

## 2023-02-10 NOTE — Telephone Encounter (Signed)
Patient called and scheduled a follow up with Dr Pricilla Holm on 8/2

## 2023-02-16 ENCOUNTER — Ambulatory Visit (INDEPENDENT_AMBULATORY_CARE_PROVIDER_SITE_OTHER): Payer: PPO | Admitting: Nurse Practitioner

## 2023-02-16 ENCOUNTER — Encounter: Payer: Self-pay | Admitting: Nurse Practitioner

## 2023-02-16 VITALS — Temp 97.5°F | Resp 18 | Ht 61.22 in | Wt 154.0 lb

## 2023-02-16 DIAGNOSIS — G20A1 Parkinson's disease without dyskinesia, without mention of fluctuations: Secondary | ICD-10-CM | POA: Diagnosis not present

## 2023-02-16 DIAGNOSIS — Z7189 Other specified counseling: Secondary | ICD-10-CM

## 2023-02-16 DIAGNOSIS — F3341 Major depressive disorder, recurrent, in partial remission: Secondary | ICD-10-CM | POA: Diagnosis not present

## 2023-02-16 DIAGNOSIS — I951 Orthostatic hypotension: Secondary | ICD-10-CM | POA: Diagnosis not present

## 2023-02-16 NOTE — Patient Instructions (Signed)
Buy compression hose that zip.  Make sure to stay hydrated,  Change positions slowly.

## 2023-02-16 NOTE — Progress Notes (Unsigned)
Careteam: Patient Care Team: [name], NP as PCP - General (Geriatric Medicine) Corky Crafts, MD as PCP - Cardiology (Cardiology)  PLACE OF SERVICE:  [facility]  Advanced Directive information    Allergies  Allergen Reactions   Codeine Other (See Comments)    Nausea     Chief Complaint  Patient presents with   Medical Management of Chronic Issues    Patient is here for a follow up for chronic conditions Would like to discuss therapy and Advanced directives with DNR     HPI: Patient is a 77 y.o. female for routine follow up.   Dealing with parkinson's symptoms.   She has a bad backache/coccyx   Reports no one addressed her anxiety or depression after the parkinson's and cancer.  She then saw a therapist but then felt like she needed to find someone different and wanted to get in with a psychiatrist for medication management.   Brother died from parkinson's. She does not want to have aggressive treatment.  She continues to work with therapist for OT/PT Having a hard time with her symptoms.    Review of Systems:  Review of Systems  Constitutional:  Negative for chills, fever and weight loss.  HENT:  Negative for tinnitus.   Respiratory:  Negative for cough, sputum production and shortness of breath.   Cardiovascular:  Negative for chest pain, palpitations and leg swelling.  Gastrointestinal:  Negative for abdominal pain, constipation, diarrhea and heartburn.  Genitourinary:  Negative for dysuria, frequency and urgency.  Musculoskeletal:  Negative for back pain, falls, joint pain and myalgias.  Skin: Negative.   Neurological:  Negative for dizziness and headaches.  Psychiatric/Behavioral:  Negative for depression and memory loss. The patient does not have insomnia.     Past Medical History:  Diagnosis Date   Anxiety    Basal cell carcinoma    Depression    Hypertension    Neuromuscular disorder (HCC)    Osteoarthritis    Osteopenia     Parkinson disease    Pneumonia    Tremors of nervous system    legs   uterine ca 10/08/2021   also endometrial cancer   Past Surgical History:  Procedure Laterality Date   CARPAL TUNNEL RELEASE Bilateral 1996/1997   CYSTOSCOPY N/A 10/08/2021   Procedure: CYSTOSCOPY;  Surgeon: Carver Fila, MD;  Location: [facility];  Service: Gynecology;  Laterality: N/A;   ROBOTIC ASSISTED TOTAL HYSTERECTOMY WITH BILATERAL SALPINGO OOPHERECTOMY N/A 10/08/2021   Procedure: XI ROBOTIC ASSISTED TOTAL HYSTERECTOMY WITH BILATERAL SALPINGO OOPHORECTOMY;SENTINEL LYMPH NODE INJECTION;  Surgeon: Carver Fila, MD;  Location: [facility];  Service: Gynecology;  Laterality: N/A;   SQUAMOUS CELL CARCINOMA EXCISION Left 2017   Left jawline   TUBAL LIGATION  1980's   Social History:   reports that she has never smoked. She has never been exposed to tobacco smoke. She has never used smokeless tobacco. She reports current alcohol use. She reports that she does not currently use drugs.  Family History  Problem Relation Age of Onset   Endometrial cancer Mother    Diabetes Mother    Alzheimer's disease Father    Coronary artery disease Father    Diabetes Sister    COPD Sister    Diabetes Sister    Heart disease Brother    Other Brother        MVA    Parkinson's disease Brother    Heart disease Brother    Alcohol abuse  Brother    Heart disease Brother    Parkinson's disease Maternal Aunt    Breast cancer Maternal Aunt    Colon cancer Paternal Aunt    Parkinson's disease Paternal Aunt    Lung cancer Paternal Uncle    Healthy Son    Healthy Son    Prostate cancer Neg Hx    Pancreatic cancer Neg Hx    Ovarian cancer Neg Hx     Medications: Patient's Medications  New Prescriptions   No medications on file  Previous Medications   ACETAMINOPHEN (TYLENOL PO)    Take 1 tablet by mouth as needed.   BISACODYL (DULCOLAX) 5 MG EC TABLET    Take by mouth.   CARBIDOPA-LEVODOPA (SINEMET IR) 25-100 MG TABLET     Take 1.5 tablets by mouth 5 (five) times daily.   ESCITALOPRAM (LEXAPRO) 5 MG TABLET    Take 5 mg by mouth daily.   LISINOPRIL (ZESTRIL) 10 MG TABLET    Take 10 mg by mouth at bedtime as needed (Take if blood pressure > 150/90 at bedtime.).   MULTIPLE VITAMIN (MULTIVITAMIN) TABLET    Take 1 tablet by mouth daily.   POLYETHYLENE GLYCOL POWDER (GLYCOLAX/MIRALAX) 17 GM/SCOOP POWDER    Take 17 g by mouth as needed.  Modified Medications   No medications on file  Discontinued Medications   No medications on file    Physical Exam:  Vitals:   02/16/23 1340  Resp: 18  Temp: (!) 97.5 F (36.4 C)  SpO2: 96%  Weight: 154 lb (69.9 kg)  Height: 5' 1.22" (1.555 m)   Body mass index is 28.89 kg/m. Wt Readings from Last 3 Encounters:  02/16/23 154 lb (69.9 kg)  12/18/22 147 lb (66.7 kg)  12/04/22 147 lb 3.2 oz (66.8 kg)    Physical Exam***  Labs reviewed: Basic Metabolic Panel: Recent Labs    11/14/22 1427  NA 144  K 3.6  CL 104  CO2 31  GLUCOSE 127  BUN 13  CREATININE 0.82  CALCIUM 9.7   Liver Function Tests: Recent Labs    11/14/22 1427  AST 12  ALT 5*  BILITOT 0.5  PROT 6.2   No results for input(s): "LIPASE", "AMYLASE" in the last 8760 hours. No results for input(s): "AMMONIA" in the last 8760 hours. CBC: Recent Labs    11/14/22 1427  WBC 5.8  NEUTROABS 3,608  HGB 12.9  HCT 38.0  MCV 91.6  PLT 249   Lipid Panel: Recent Labs    11/14/22 1427  CHOL 190  HDL 53  LDLCALC 86  TRIG 386*  CHOLHDL 3.6   TSH: No results for input(s): "TSH" in the last 8760 hours. A1C: No results found for: "HGBA1C"   Assessment/Plan There are no diagnoses linked to this encounter.  No follow-ups on file.: ***  Danielle Mink K. Biagio Borg [facility] [phone]

## 2023-02-19 ENCOUNTER — Ambulatory Visit: Payer: PPO | Admitting: Occupational Therapy

## 2023-02-19 DIAGNOSIS — M6281 Muscle weakness (generalized): Secondary | ICD-10-CM

## 2023-02-19 DIAGNOSIS — R278 Other lack of coordination: Secondary | ICD-10-CM

## 2023-02-19 DIAGNOSIS — R29818 Other symptoms and signs involving the nervous system: Secondary | ICD-10-CM

## 2023-02-19 NOTE — Therapy (Signed)
OUTPATIENT OCCUPATIONAL THERAPY PARKINSON'S TREATMENT NOTE  Patient Name: Ashley Neal MRN: [id number] DOB:01/13/1946, 77 y.o., female Today's Date: 02/19/2023  PCP: [name], NP REFERRING PROVIDER: [name], NP  END OF SESSION:  OT End of Session - 02/19/23 0806     Visit Number 11    Number of Visits 12    Date for OT Re-Evaluation 02/27/23    Authorization Type [employer]    OT Start Time 0804    OT Stop Time 0848    OT Time Calculation (min) 44 min    Activity Tolerance Patient tolerated treatment well;Patient limited by pain;Patient limited by fatigue    Behavior During Therapy [facility] for tasks assessed/performed;Restless                   Past Medical History:  Diagnosis Date   Anxiety    Basal cell carcinoma    Depression    Hypertension    Neuromuscular disorder (HCC)    Osteoarthritis    Osteopenia    Parkinson disease    Pneumonia    Tremors of nervous system    legs   uterine ca 10/08/2021   also endometrial cancer   Past Surgical History:  Procedure Laterality Date   CARPAL TUNNEL RELEASE Bilateral 1996/1997   CYSTOSCOPY N/A 10/08/2021   Procedure: CYSTOSCOPY;  Surgeon: Carver Fila, MD;  Location: [facility];  Service: Gynecology;  Laterality: N/A;   ROBOTIC ASSISTED TOTAL HYSTERECTOMY WITH BILATERAL SALPINGO OOPHERECTOMY N/A 10/08/2021   Procedure: XI ROBOTIC ASSISTED TOTAL HYSTERECTOMY WITH BILATERAL SALPINGO OOPHORECTOMY;SENTINEL LYMPH NODE INJECTION;  Surgeon: Carver Fila, MD;  Location: [facility];  Service: Gynecology;  Laterality: N/A;   SQUAMOUS CELL CARCINOMA EXCISION Left 2017   Left jawline   TUBAL LIGATION  1980's   Patient Active Problem List   Diagnosis Date Noted   Endometrial cancer (HCC) 10/16/2021   Complex atypical endometrial hyperplasia 09/27/2021   Primary osteoarthritis of both hands 05/14/2020   Primary osteoarthritis of both feet 05/14/2020   Age-related osteoporosis without  current pathological fracture 05/14/2020   Prediabetes 05/14/2020   Essential hypertension 05/14/2020   History of hyperlipidemia 05/14/2020   History of squamous cell carcinoma 05/14/2020   Parkinsonism 05/14/2020    ONSET DATE: referral date 11/14/22  REFERRING DIAG: G20.A1 (ICD-10-CM) - Parkinson's disease, unspecified whether dyskinesia present, unspecified whether manifestations fluctuate M54.16 (ICD-10-CM) - Lumbar radiculopathy  THERAPY DIAG:  Muscle weakness (generalized)  Other lack of coordination  Other symptoms and signs involving the nervous system  Rationale for Evaluation and Treatment: Rehabilitation  PERTINENT HISTORY: PD, HTN, osteoarthritis, chronic back pain, depression "I hate Parkinson's"  Pt reports that her mother's sister and pt's brother both had PD.  Pt reports past 2 years have been awful; with onset of back pain, uterine cancer s/p hysterectomy and lymph node removal, and persistent back ache.  Pt reports that she has difficulty with managing buttons, feeding herself (especially cutting).  Pt reports she has been going [facility] a [facility] the senior center.    PRECAUTIONS: Fall ; WEIGHT BEARING RESTRICTIONS: No   SUBJECTIVE:   SUBJECTIVE STATEMENT: "I can't handle this back pain."  Reports a cramping sensation that has come on more in the past few months.     PAIN:  Are you having pain?   Yes: NPRS scale: 4-5/10 (chronic pain)  Pain location: pain in pelvis and lower back pain Pain description: radiating Aggravating factors: sitting Relieving factors: medication, xanax  worked (but they are not prescribing it to her anymore)  FALLS: Has patient fallen in last 6 months? Yes. 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   PATIENT GOALS: to be able to maintain typical living skills to remain in home   OBJECTIVE: (All objective assessments below are from initial evaluation on: 12/04/22 unless otherwise specified.)   HAND DOMINANCE: Right  ADLs: Transfers/ambulation related to ADLs: Independent Eating: typically will eat hand held items, difficulty with cutting foods Grooming: difficulty with styling hair UB Dressing: buttons are challenging, fastens bra in front LB Dressing: typically will lean up against something and complete in standing Toileting: Mod I, grab bar next to toilet Bathing: Mod I, has shower chair but doesn't use it Tub Shower transfers: had tub/shower remodeled to walk in shower Equipment: Shower seat with back, Grab bars, Walk in shower, Reacher, and Long handled shoe horn  IADLs: Shopping: order online, son will pick up for her Light housekeeping: reports that she can't clean as well as she used to Meal Prep: makes simple meals Community mobility: still driving Medication management: easy off caps, uses phone as reminder to take Handwriting: 90% legible, Increased time, Moderate micrographia, and PPT #1: 14.59 ([place])  MOBILITY STATUS: Hx of falls  POSTURE COMMENTS:  rounded shoulders  ACTIVITY TOLERANCE: Activity tolerance: WFL for tasks assessed on eval  FUNCTIONAL OUTCOME MEASURES:  12/17/22: Grip strength measures: Rt: 15#; Lt: 23#  Eval: Fastening/unfastening 3 buttons: 1:07.94 Physical performance test: PPT#2 (simulated eating) 13.57 sec & PPT#4 (donning/doffing jacket): 13.37sec (12/17/22)  COORDINATION: 9 Hole Peg test: Right: 1:10.91 sec; Left: 57.75 sec 01/14/23 R: 64.38 sec and L: 49.53 sec Box and Blocks:  Right 48 blocks, Left 46 blocks Tremors: Resting and Right  UE  ROM:   01/07/23: Rt sh flex: 116*, Abd: 82*, ER: 75*, IR: 46*   12/24/22: Rt Sh:   Flexion:  100*, Abd: 105*, ER: 46* tender, IR 44*   UE MMT:   WFL  SENSATION: Decreased sensation in finger tips in B hands  COGNITION: Overall cognitive status: Within functional limits for tasks assessed  OBSERVATIONS: Bradykinesia   TODAY'S TREATMENT:                      02/19/23 Back stretches: OT instructed pt in back stretches in standing and sitting with focus on alleviating low back pain.  Encouraged pt to sustain stretch as long as no increase in pain.  Pt preferring modified cat/cow stretch while standing with UE supported on counter top.  Pt did report noticing mild decline in BP with standing activities, therefore educated on completing with UE support and/or chair placed behind pt to allow for her to sit as needed.  Also discussed modified head positioning to decrease major fluctuations in positioning.   Exercises - Seated Flexion Stretch  - 2-3 x daily - 3-5 reps - 5sec hold - Standing Lumbar Spine Flexion Stretch Counter  - 2-3 x daily - 1 sets - 10-15 reps - Seated Lumbar Extension  - 2-3 x daily - 3-5 reps - 5sec hold - Modified Cat  Cow on Counter  - 2-3 x daily - 3-5 reps - 5 sec hold Self-care: reviewed and reiterated modifications to routine tasks to increase ease/safety/independence.  Pt reports utilizing modified techniques/adaptive strategies to increase ease/safety with picking up pills and exiting house.  Pt utilizing foam pad underneath pills to increase success with picking up meds.  Pt reports applying tape to steps to increase success with stepping pattern when exiting home. Simulated dressing: Pt continues to demonstrate decreased ease with undressing therefore completed simulated dressing with use of gait belt.  Pt able to don overhead and doff with increased time and cues to breathe through task.  Pt to bring in a shirt next session to practice, as pt reports getting stuck at  scapulae.    BP assessed post back stretches in standing: 106/80.  HR 84.   02/10/23 Large amplitude: engaged in card flipping with focus on large amplitude movement with supination and hand opening.  OT providing initial demonstration and min cues as fatigue set in to continue with forearm rotation and opening of hand to release card onto stack.   Self-feeding: engaged in scooping of beans from bowl with focus on motor control.  OT educated on modified hand placement to facilitate increased motor control and decrease of tremors with increased proximity to bowl of spoon and intermittent squeezes on utensil to keep activity active.  Pt completed PPT #2: 10.87 sec with minimal tremors during task this session.   02/04/23 Puzzle: engaged in flipping over of puzzle pieces with focus on exaggerated, large amplitude movements.   Handwriting: provided with handout of recommendations from [facility] to aid in legibility with handwriting. Trialed multiple suggestions with focus on printing, slowing down writing, and use of built up handle/pen grip. Discussed purchasing pen/pencil grips to aid in keeping hand more relaxed during writing. Pt with increased tremor this session impacting handwriting.                                                           PATIENT EDUCATION: Education details: ongoing condition specific education, provided with handouts on community PD programs Person educated: Patient Education method: Explanation and Handouts Education comprehension: verbalized understanding and needs further education  HOME EXERCISE PROGRAM: Access Code: [email or web address] URL: [email or web address] Date: 12/24/2022 Prepared by: Fannie Knee   GOALS: Goals reviewed with patient? Yes  SHORT TERM GOALS: Target date: 01/02/23  Pt will be independent with PD specific HEP. Baseline: needs plan Goal status: 01/02/23: MET-she states following along with LSVT exercises online at  home.  2.  Pt will verbalize understanding of adapted strategies to maximize safety and I with ADLs/ IADLs. Baseline:  Goal status: 01/02/23: Progressing  3.  Pt will demonstrate improved fine motor coordination for ADLs as evidenced by decreasing 9 hole peg test score for RUE by 5 secs Baseline: 9 Hole Peg test: Right: 1:10.91 sec; Left: 57.75 sec Goal status: Met - R: 64.38 sec and L: 49.53 sec 01/14/23  4.  Pt will demonstrate improved grip strength as needed to mange clothing fasteners and open various household containers. Baseline: grip strength 15# Rt, 23# Lt Goal status: 01/02/23: Progressing    LONG TERM GOALS: Target date: 02/27/23  Pt will write a short paragraph with 100% legibility and no significant decrease in letter size Baseline: 90% legibility  with mild-mod decrease in size Goal status: IN PROGRESS  2. Pt will verbalize understanding of adapted strategies to maximize safety and I with ADLs/ IADLs. Baseline:  Goal status: IN PROGRESS  3.  Pt will demonstrate improved ease with feeding as evidenced by decreasing PPT#2 (self feeding) by 3 secs Baseline: 13.57 sec Goal status: IN PROGRESS  4.  Pt will verbalize understanding of ways to prevent future PD related complications and PD community resources. Baseline:  Goal status: Met - 02/19/23  ASSESSMENT:  CLINICAL IMPRESSION: Pt demonstrating good technique with back stretches and reports no increase in pain with mobility.  Pt reporting understanding of recommendation to maintain mobility and exercise regimen to maintain and increase mobility.  Pt reporting enjoying programs at the senior center and planning to explore yoga class for PD.  Pt asking questions about how to get established with PCA when the time comes.  PLAN:  OT FREQUENCY: 1-2x/week  OT DURATION: 8 weeks  PLANNED INTERVENTIONS: self care/ADL training, therapeutic exercise, therapeutic activity, neuromuscular re-education, manual therapy, passive  range of motion, balance training, functional mobility training, ultrasound, compression bandaging, moist heat, cryotherapy, contrast bath, patient/family education, psychosocial skills training, energy conservation, coping strategies training, and DME and/or AE instructions  CONSULTED AND AGREED WITH PLAN OF CARE: Patient  PLAN FOR NEXT SESSION:  Continue working on functional skills and improving UE motion and functional ADL ability.  She states that she is in contact with her physician about her low blood pressure recently and this is monitored by OT for safety during sessions. Just be aware it has been running low.  Plan to further address UB dressing as pt reports continued difficulty with this -plan to bring in shirt/jacket. Handwriting, coordination, and large amplitude HEP.  F/u regarding back pain.   Rosalio Loud, OTR/L 02/19/2023, 9:07 AM

## 2023-02-20 DIAGNOSIS — R1310 Dysphagia, unspecified: Secondary | ICD-10-CM | POA: Diagnosis not present

## 2023-02-20 DIAGNOSIS — F32A Depression, unspecified: Secondary | ICD-10-CM | POA: Diagnosis not present

## 2023-02-20 DIAGNOSIS — G20A1 Parkinson's disease without dyskinesia, without mention of fluctuations: Secondary | ICD-10-CM | POA: Diagnosis not present

## 2023-02-23 NOTE — Telephone Encounter (Signed)
[facility] Neurology notes received from 02/20/23 visit.  Notes given to MM NP for review.

## 2023-02-24 ENCOUNTER — Ambulatory Visit: Payer: PPO | Admitting: Occupational Therapy

## 2023-02-24 DIAGNOSIS — R29818 Other symptoms and signs involving the nervous system: Secondary | ICD-10-CM

## 2023-02-24 DIAGNOSIS — R278 Other lack of coordination: Secondary | ICD-10-CM

## 2023-02-24 DIAGNOSIS — M6281 Muscle weakness (generalized): Secondary | ICD-10-CM | POA: Diagnosis not present

## 2023-02-24 NOTE — Therapy (Signed)
OUTPATIENT OCCUPATIONAL THERAPY PARKINSON'S TREATMENT NOTE  Patient Name: LINZY LAURY MRN: [id number] DOB:1946-05-10, 77 y.o., female Today's Date: 02/24/2023  PCP: [name], NP REFERRING PROVIDER: [name], NP  END OF SESSION:  OT End of Session - 02/24/23 1329     Visit Number 12    Number of Visits 16    Date for OT Re-Evaluation 03/27/23    Authorization Type [employer]    OT Start Time 1322    OT Stop Time 1402    OT Time Calculation (min) 40 min    Activity Tolerance Patient tolerated treatment well;Patient limited by pain;Patient limited by fatigue    Behavior During Therapy [facility] for tasks assessed/performed;Restless                    Past Medical History:  Diagnosis Date   Anxiety    Basal cell carcinoma    Depression    Hypertension    Neuromuscular disorder (HCC)    Osteoarthritis    Osteopenia    Parkinson disease    Pneumonia    Tremors of nervous system    legs   uterine ca 10/08/2021   also endometrial cancer   Past Surgical History:  Procedure Laterality Date   CARPAL TUNNEL RELEASE Bilateral 1996/1997   CYSTOSCOPY N/A 10/08/2021   Procedure: CYSTOSCOPY;  Surgeon: Carver Fila, MD;  Location: [facility];  Service: Gynecology;  Laterality: N/A;   ROBOTIC ASSISTED TOTAL HYSTERECTOMY WITH BILATERAL SALPINGO OOPHERECTOMY N/A 10/08/2021   Procedure: XI ROBOTIC ASSISTED TOTAL HYSTERECTOMY WITH BILATERAL SALPINGO OOPHORECTOMY;SENTINEL LYMPH NODE INJECTION;  Surgeon: Carver Fila, MD;  Location: [facility];  Service: Gynecology;  Laterality: N/A;   SQUAMOUS CELL CARCINOMA EXCISION Left 2017   Left jawline   TUBAL LIGATION  1980's   Patient Active Problem List   Diagnosis Date Noted   Endometrial cancer (HCC) 10/16/2021   Complex atypical endometrial hyperplasia 09/27/2021   Primary osteoarthritis of both hands 05/14/2020   Primary osteoarthritis of both feet 05/14/2020   Age-related osteoporosis without  current pathological fracture 05/14/2020   Prediabetes 05/14/2020   Essential hypertension 05/14/2020   History of hyperlipidemia 05/14/2020   History of squamous cell carcinoma 05/14/2020   Parkinsonism 05/14/2020    ONSET DATE: referral date 11/14/22  REFERRING DIAG: G20.A1 (ICD-10-CM) - Parkinson's disease, unspecified whether dyskinesia present, unspecified whether manifestations fluctuate M54.16 (ICD-10-CM) - Lumbar radiculopathy  THERAPY DIAG:  Muscle weakness (generalized)  Other lack of coordination  Other symptoms and signs involving the nervous system  Rationale for Evaluation and Treatment: Rehabilitation  PERTINENT HISTORY: PD, HTN, osteoarthritis, chronic back pain, depression "I hate Parkinson's"  Pt reports that her mother's sister and pt's brother both had PD.  Pt reports past 2 years have been awful; with onset of back pain, uterine cancer s/p hysterectomy and lymph node removal, and persistent back ache.  Pt reports that she has difficulty with managing buttons, feeding herself (especially cutting).  Pt reports she has been going to a balance class at the senior center.    PRECAUTIONS: Fall ; WEIGHT BEARING RESTRICTIONS: No   SUBJECTIVE:   SUBJECTIVE STATEMENT: Pt reports that she saw her physician last Friday who reports that she should start to look for a nursing home.     PAIN:  Are you having pain?   Yes: NPRS scale: 4-5/10 (chronic pain)  Pain location: pain in pelvis and lower back pain Pain description: radiating Aggravating factors: sitting Relieving factors: medication,  xanax worked (but they are not prescribing it to her anymore)  FALLS: Has patient fallen in last 6 months? Yes. 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   PATIENT GOALS: to be able to maintain typical living skills to remain in home   OBJECTIVE: (All objective assessments below are from initial evaluation on: 12/04/22 unless otherwise specified.)   HAND DOMINANCE: Right  ADLs: Transfers/ambulation related to ADLs: Independent Eating: typically will eat hand held items, difficulty with cutting foods Grooming: difficulty with styling hair UB Dressing: buttons are challenging, fastens bra in front LB Dressing: typically will lean up against something and complete in standing Toileting: Mod I, grab bar next to toilet Bathing: Mod I, has shower chair but doesn't use it Tub Shower transfers: had tub/shower remodeled to walk in shower Equipment: Shower seat with back, Grab bars, Walk in shower, Reacher, and Long handled shoe horn  IADLs: Shopping: order online, son will pick up for her Light housekeeping: reports that she can't clean as well as she used to Meal Prep: makes simple meals Community mobility: still driving Medication management: easy off caps, uses phone as reminder to take Handwriting: 90% legible, Increased time, Moderate micrographia, and PPT #1: 14.59 ([place])  MOBILITY STATUS: Hx of falls  POSTURE COMMENTS:  rounded shoulders  ACTIVITY TOLERANCE: Activity tolerance: WFL for tasks assessed on eval  FUNCTIONAL OUTCOME MEASURES:  12/17/22: Grip strength measures: Rt: 15#; Lt: 23#  Eval: Fastening/unfastening 3 buttons: 1:07.94 Physical performance test: PPT#2 (simulated eating) 13.57 sec & PPT#4 (donning/doffing jacket): 13.37sec (12/17/22)  COORDINATION: 9 Hole Peg test: Right: 1:10.91 sec; Left: 57.75 sec 01/14/23 R: 64.38 sec and L: 49.53 sec Box and Blocks:  Right 48 blocks, Left 46 blocks Tremors: Resting and Right  UE  ROM:   01/07/23: Rt sh flex: 116*, Abd: 82*, ER: 75*, IR: 46*   12/24/22: Rt Sh:   Flexion:  100*, Abd: 105*, ER: 46* tender, IR 44*   UE MMT:   WFL  SENSATION: Decreased sensation in finger tips in B hands  COGNITION: Overall cognitive status: Within functional limits for tasks assessed  OBSERVATIONS: Bradykinesia   TODAY'S TREATMENT:                      02/24/23 Self-care: engaged in massed practice with UB dressing with variety of styles of shirts from pt's personal collection.  Pt demonstrating good breathing techniques when getting worked up when attempting to [employer], therefore succeeding in doffing without physical assistance.  OT providing demonstration and cues for alterative strategies for doffing shirt to decrease risk of getting stuck.  Pt to go through clothing and eliminate more tight fitting shirts.  Pt donning/doffing jacket with difficulty with doffing, therefore will continue to benefit from education on large amplitude "cape technique" with donning/doffing jacket.    02/19/23 Back stretches: OT instructed pt in back stretches in standing and sitting with focus on alleviating low back pain.  Encouraged pt to sustain stretch as long as no increase in pain.  Pt preferring modified cat/cow stretch while standing with UE supported on counter top.  Pt did report noticing mild decline in BP with standing activities, therefore educated on completing with UE support and/or chair placed behind pt to allow for her to sit as needed.  Also discussed modified head positioning to decrease major fluctuations in positioning.   Exercises - Seated Flexion Stretch  - 2-3 x daily - 3-5 reps - 5sec hold - Standing Lumbar Spine Flexion Stretch Counter  - 2-3 x daily - 1 sets - 10-15 reps - Seated Lumbar Extension  - 2-3 x daily - 3-5 reps - 5sec hold - Modified Cat Cow on Counter  - 2-3 x daily - 3-5 reps - 5 sec hold Self-care: reviewed and reiterated modifications to routine tasks to  increase ease/safety/independence.  Pt reports utilizing modified techniques/adaptive strategies to increase ease/safety with picking up pills and exiting house.  Pt utilizing foam pad underneath pills to increase success with picking up meds.  Pt reports applying tape to steps to increase success with stepping pattern when exiting home. Simulated dressing: Pt continues to demonstrate decreased ease with undressing therefore completed simulated dressing with use of gait belt.  Pt able to don overhead and doff with increased time and cues to breathe through task.  Pt to bring in a shirt next session to practice, as pt reports getting stuck at scapulae.    BP assessed post back stretches in standing: 106/80.  HR 84.   02/10/23 Large amplitude: engaged in card flipping with focus on large amplitude movement with supination and hand opening.  OT providing initial demonstration and min cues as fatigue set in to continue with forearm rotation and opening of hand to release card onto stack.   Self-feeding: engaged in scooping of beans from bowl with focus on motor control.  OT educated on modified hand placement to facilitate increased motor control and decrease of tremors with increased proximity to bowl of spoon and intermittent squeezes on utensil to keep activity active.  Pt completed PPT #2: 10.87 sec with minimal tremors during task this session.                                                           PATIENT EDUCATION: Education details: ongoing condition specific education, provided with handouts on community PD programs Person educated: Patient Education method: Explanation and Handouts Education comprehension: verbalized understanding and needs further education  HOME EXERCISE PROGRAM: Access Code: [email or web address] URL: [email or web address] Date: 12/24/2022 Prepared by: Fannie Knee   GOALS: Goals reviewed with patient? Yes  SHORT TERM GOALS: Target date: 01/02/23  Pt will  be independent with PD specific HEP. Baseline: needs plan Goal status: 01/02/23: MET-she states following along with LSVT exercises online at home.  2.  Pt will verbalize understanding of adapted strategies to maximize safety and I with ADLs/ IADLs. Baseline:  Goal status: 01/02/23: Progressing  3.  Pt will demonstrate improved fine motor coordination for ADLs as evidenced by decreasing 9 hole peg test score for RUE by 5 secs Baseline: 9 Hole Peg test: Right: 1:10.91 sec; Left: 57.75 sec Goal status: Met - R: 64.38 sec and L: 49.53 sec 01/14/23  4.  Pt will demonstrate improved grip strength as needed to mange clothing fasteners and open various household containers. Baseline: grip strength 15# Rt, 23# Lt Goal status: 01/02/23: Progressing    LONG TERM  GOALS: Target date: 02/27/23  Pt will write a short paragraph with 100% legibility and no significant decrease in letter size Baseline: 90% legibility with mild-mod decrease in size Goal status: Not met  2. Pt will verbalize understanding of adapted strategies to maximize safety and I with ADLs/ IADLs. Baseline:  Goal status: Met - 02/24/23  3.  Pt will demonstrate improved ease with feeding as evidenced by decreasing PPT#2 (self feeding) by 3 secs Baseline: 13.57 sec Goal status: Not met  4.  Pt will verbalize understanding of ways to prevent future PD related complications and PD community resources. Baseline:  Goal status: Met - 02/19/23   NEW LONG TERM GOALS: Target date: 03/27/23  Pt will write a short paragraph with 100% legibility and no significant decrease in letter size Baseline: 90% legibility with mild-mod decrease in size Goal status: IN PROGRESS  2.  Pt will demonstrate improved ease with feeding as evidenced by decreasing PPT#2 (self feeding) by 3 secs Baseline: 13.57 sec Goal status: IN PROGRESS  3.  Pt will demonstrate improved ease with UB dressing tasks (doffing shirt and donning/doffing jacket) with use of  adaptive strategies and carryover of large amplitude movements.  Baseline:  Goal status: INITIAL  ASSESSMENT:  CLINICAL IMPRESSION: Pt significantly more verbose this session, discussing progressive nature of her disease and her quest to find in-home caregivers to aid in housekeeping tasks and washing hair.  Pt demonstrating improved activity tolerance with no instances of lightheadedness with prolonged standing during dressing tasks.    PLAN:  OT FREQUENCY: 1-2x/week  OT DURATION: 8 weeks  PLANNED INTERVENTIONS: self care/ADL training, therapeutic exercise, therapeutic activity, neuromuscular re-education, manual therapy, passive range of motion, balance training, functional mobility training, ultrasound, compression bandaging, moist heat, cryotherapy, contrast bath, patient/family education, psychosocial skills training, energy conservation, coping strategies training, and DME and/or AE instructions  CONSULTED AND AGREED WITH PLAN OF CARE: Patient  PLAN FOR NEXT SESSION:  Continue working on functional skills and improving UE motion and functional ADL ability.  Plan to further address UB dressing (and jacket on/off) as pt reports continued difficulty with thist. Handwriting, coordination, and large amplitude HEP.  F/u regarding back pain.   Rosalio Loud, OTR/L 02/24/2023, 1:46 PM

## 2023-03-03 ENCOUNTER — Ambulatory Visit: Payer: PPO | Admitting: Occupational Therapy

## 2023-03-03 ENCOUNTER — Ambulatory Visit: Payer: PPO

## 2023-03-03 ENCOUNTER — Other Ambulatory Visit: Payer: Self-pay

## 2023-03-03 ENCOUNTER — Ambulatory Visit: Payer: PPO | Admitting: Nurse Practitioner

## 2023-03-03 DIAGNOSIS — M6281 Muscle weakness (generalized): Secondary | ICD-10-CM | POA: Diagnosis not present

## 2023-03-03 DIAGNOSIS — R278 Other lack of coordination: Secondary | ICD-10-CM

## 2023-03-03 DIAGNOSIS — R29818 Other symptoms and signs involving the nervous system: Secondary | ICD-10-CM

## 2023-03-03 DIAGNOSIS — R1310 Dysphagia, unspecified: Secondary | ICD-10-CM

## 2023-03-03 NOTE — Therapy (Signed)
OUTPATIENT SPEECH LANGUAGE PATHOLOGY SWALLOW EVALUATION   Patient Name: Ashley Neal MRN: [id number] DOB:November 22, 1945, 77 y.o., female Today's Date: 03/03/2023  PCP: Abbey Chatters, NP REFERRING PROVIDER: Harmon Pier, MD  END OF SESSION:  End of Session - 03/03/23 1547     Visit Number 1    Number of Visits 1    Date for SLP Re-Evaluation 03/03/23    SLP Start Time 1536    SLP Stop Time  1618    SLP Time Calculation (min) 42 min    Activity Tolerance Patient tolerated treatment well             Past Medical History:  Diagnosis Date   Anxiety    Basal cell carcinoma    Depression    Hypertension    Neuromuscular disorder (HCC)    Osteoarthritis    Osteopenia    Parkinson disease    Pneumonia    Tremors of nervous system    legs   uterine ca 10/08/2021   also endometrial cancer   Past Surgical History:  Procedure Laterality Date   CARPAL TUNNEL RELEASE Bilateral 1996/1997   CYSTOSCOPY N/A 10/08/2021   Procedure: CYSTOSCOPY;  Surgeon: Carver Fila, MD;  Location: [facility];  Service: Gynecology;  Laterality: N/A;   ROBOTIC ASSISTED TOTAL HYSTERECTOMY WITH BILATERAL SALPINGO OOPHERECTOMY N/A 10/08/2021   Procedure: XI ROBOTIC ASSISTED TOTAL HYSTERECTOMY WITH BILATERAL SALPINGO OOPHORECTOMY;SENTINEL LYMPH NODE INJECTION;  Surgeon: Carver Fila, MD;  Location: [facility];  Service: Gynecology;  Laterality: N/A;   SQUAMOUS CELL CARCINOMA EXCISION Left 2017   Left jawline   TUBAL LIGATION  1980's   Patient Active Problem List   Diagnosis Date Noted   Endometrial cancer (HCC) 10/16/2021   Complex atypical endometrial hyperplasia 09/27/2021   Primary osteoarthritis of both hands 05/14/2020   Primary osteoarthritis of both feet 05/14/2020   Age-related osteoporosis without current pathological fracture 05/14/2020   Prediabetes 05/14/2020   Essential hypertension 05/14/2020   History of hyperlipidemia 05/14/2020   History of squamous cell carcinoma  05/14/2020   Parkinsonism 05/14/2020    ONSET DATE: "about two months ago"   REFERRING DIAG: R13.10 (ICD-10-CM) - Dysphagia, unspecified  THERAPY DIAG:  Dysphagia, unspecified type  Rationale for Evaluation and Treatment: Rehabilitation  SUBJECTIVE:   SUBJECTIVE STATEMENT: "I had more trouble with food passing through my throat in the last two months"  Pt accompanied by: self  PERTINENT HISTORY: Pt dx'd with PD about 7 years ago, carbidopa-levodopa was doubled the last appt with her referring MD.   PAIN:  Are you having pain? Yes: NPRS scale: 5/10 Pain location: pelvis and lower back Pain description: radiates Aggravating factors: sitting too long Relieving factors: meds  FALLS: Has patient fallen in last 6 months?  Yes, See PT evaluation for details  LIVING ENVIRONMENT: Lives with: lives alone Lives in: House/apartment  PLOF:  Level of assistance: Independent with ADLs, Independent with IADLs Employment: Retired  PATIENT GOALS: "I wondered what was wrong."  OBJECTIVE:   COGNITION: Overall cognitive status: Within functional limits for tasks assessed  ORAL MOTOR EXAMINATION: Overall status: Impaired: Lingual: Right (Strength) Facial: Bilateral (ROM) Comments: hypomimia (mild)  CLINICAL SWALLOW ASSESSMENT:   Current diet: regular and thin liquids Dentition: dental implants on top, partial on the bottom Patient directly observed with POs: Yes: regular, dysphagia 3 (soft), and thin liquids; peanut butter cracker, [employer] bar, and water Feeding: able to feed self Liquids provided by: cup Oral phase signs and symptoms: veritical/linear chewing  pattern Pharyngeal phase signs and symptoms: none noted today  PATIENT REPORTED OUTCOME MEASURES (PROM): N/A due to one time visit   TODAY'S TREATMENT:                                                                                                                                         DATE:   PATIENT  EDUCATION: Education details: effortful swallow exercise, no need for MBS at this time, need to chew more rotary pattern than vertical pattern Person educated: Patient Education method: Explanation, Demonstration, Verbal cues, and Handouts Education comprehension: verbalized understanding, returned demonstration, and verbal cues required   ASSESSMENT:  CLINICAL IMPRESSION: Patient is a 77 y.o. F who was seen today for assessment of swallowing (BSE) in light of PD. Oral stage difference due to vertical instead of rotary chewing pattern. SLP told pt to be more mindful of horizontal movement when chewing; Suspect  her rare episodes of reduced pharyngeal clearance may have this as a cause. Pt without any pharyngeal deficits seen today but as stated above, she has reported having more difficulty with pharyngeal clearance approx x2/month for the last two months. SLP taught pt effortful swallow and pt will cont this for 8 weeks then twice- 3 times  a week afterwards. Pt desires ST evaluation for dysarthria - she will contact referring MD.    OBJECTIVE IMPAIRMENTS: include dysarthria and dysphagia which SLP provided pt with rotary chewing suggestion and prophylactic exercise (effortful swallow). These impairments are limiting patient from effectively communicating at home and in community and safety when swallowing. Factors affecting potential to achieve goals and functional outcome are pain level. Patient will benefit from skilled SLP services to address above impairments and improve overall function.   PLAN:  SLP FREQUENCY: one time visit for swallowing; Pt is interested in obtaining referral for speech services for dysarthria - pt will pursue this referral through referring MD's office.    Alexandra Posadas, CCC-SLP 03/03/2023, 5:17 PM

## 2023-03-03 NOTE — Patient Instructions (Signed)
   SWALLOWING EXERCISES Do for 8 weeks, then 2-3 times per week afterwards  Effortful Swallows - Swallow a strong swallow as you take a small sip of water - Repeat 20 times a day, no less than 5 reps at once     Please ask Dr. Lysle Rubens for a referral for Speech evaluation and treat for dysarthria

## 2023-03-03 NOTE — Therapy (Signed)
OUTPATIENT OCCUPATIONAL THERAPY [facility]AT[facility] NOTE  Patient Name: Ashley Neal MRN: [id number] DOB:[birth year], 77 y.o., female Today's Date: 03/03/2023  PCP: [name], NP REFERRING PROVIDER: [name], NP  END OF SESSION:  OT End of Session - 03/03/23 1551     Visit Number 13    Number of Visits 16    Date for OT Re-Evaluation 03/27/23    Authorization Type [employer]    OT Start Time 1452    OT Stop Time 1532    OT Time Calculation (min) 40 min    Activity Tolerance Patient tolerated treatment well;Patient limited by pain;Patient limited by fatigue    Behavior During Therapy [facility] for tasks assessed/performed;Restless                     Past Medical History:  Diagnosis Date   Anxiety    Basal cell carcinoma    Depression    Hypertension    Neuromuscular disorder (HCC)    Osteoarthritis    Osteopenia    Parkinson disease    Pneumonia    Tremors of nervous system    legs   uterine ca 10/08/2021   also endometrial cancer   Past Surgical History:  Procedure Laterality Date   CARPAL TUNNEL RELEASE Bilateral 1996/1997   CYSTOSCOPY N/A 10/08/2021   Procedure: CYSTOSCOPY;  Surgeon: Carver Fila, MD;  Location: [facility];  Service: Gynecology;  Laterality: N/A;   ROBOTIC ASSISTED TOTAL HYSTERECTOMY WITH BILATERAL SALPINGO OOPHERECTOMY N/A 10/08/2021   Procedure: XI ROBOTIC ASSISTED TOTAL HYSTERECTOMY WITH BILATERAL SALPINGO OOPHORECTOMY;SENTINEL LYMPH NODE INJECTION;  Surgeon: Carver Fila, MD;  Location: [facility];  Service: Gynecology;  Laterality: N/A;   SQUAMOUS CELL CARCINOMA EXCISION Left 2017   Left jawline   TUBAL LIGATION  1980's   Patient Active Problem List   Diagnosis Date Noted   Endometrial cancer (HCC) 10/16/2021   Complex atypical endometrial hyperplasia 09/27/2021   Primary osteoarthritis of both hands 05/14/2020   Primary osteoarthritis of both feet 05/14/2020   Age-related osteoporosis  without current pathological fracture 05/14/2020   Prediabetes 05/14/2020   Essential hypertension 05/14/2020   History of hyperlipidemia 05/14/2020   History of squamous cell carcinoma 05/14/2020   Parkinsonism 05/14/2020    ONSET DATE: referral date 11/14/22  REFERRING DIAG: G20.A1 (ICD-10-CM) - Parkinson's disease, unspecified whether dyskinesia present, unspecified whether manifestations fluctuate M54.16 (ICD-10-CM) - Lumbar radiculopathy  THERAPY DIAG:  Muscle weakness (generalized)  Other lack of coordination  Other symptoms and signs involving the nervous system  Rationale for Evaluation and Treatment: Rehabilitation  PERTINENT HISTORY: PD, HTN, osteoarthritis, chronic back pain, depression "I hate Parkinson's"  Pt reports that her mother's sister and pt's brother both had PD.  Pt reports past 2 years have been awful; with onset of back pain, uterine cancer s/p hysterectomy and lymph node removal, and persistent back ache.  Pt reports that she has difficulty with managing buttons, feeding herself (especially cutting).  Pt reports she has been [facility] center.    PRECAUTIONS: Fall ; WEIGHT BEARING RESTRICTIONS: No   SUBJECTIVE:   SUBJECTIVE STATEMENT: "I'm not feeling too well today."   PAIN:  Are you having pain?   Yes: NPRS scale: 5/10 (chronic pain)  Pain location: pain in pelvis and lower back pain Pain description: radiating Aggravating factors: sitting Relieving factors: medication, xanax worked (but they are not prescribing it to her anymore)  FALLS: Has patient fallen  in last 6 months? Yes. 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   PATIENT GOALS: to be able to maintain typical living skills to remain in home   OBJECTIVE: (All objective assessments below are from initial evaluation on: 12/04/22 unless otherwise specified.)   HAND DOMINANCE: Right  ADLs: Transfers/ambulation related to ADLs: Independent Eating: typically will eat hand held items, difficulty with cutting foods Grooming: difficulty with styling hair UB Dressing: buttons are challenging, fastens bra in front LB Dressing: typically will lean up against something and complete in standing Toileting: Mod I, grab bar next to toilet Bathing: Mod I, has shower chair but doesn't use it Tub Shower transfers: had tub/shower remodeled to walk in shower Equipment: Shower seat with back, Grab bars, Walk in shower, Reacher, and Long handled shoe horn  IADLs: Shopping: order online, son will pick up for her Light housekeeping: reports that she can't clean as well as she used to Meal Prep: makes simple meals Community mobility: still driving Medication management: easy off caps, uses phone as reminder to take Handwriting: 90% legible, Increased time, Moderate micrographia, and PPT #1: 14.59 ([place])  MOBILITY STATUS: Hx of falls  POSTURE COMMENTS:  rounded shoulders  ACTIVITY TOLERANCE: Activity tolerance: WFL for tasks assessed on eval  FUNCTIONAL OUTCOME MEASURES:  12/17/22: Grip strength measures: Rt: 15#; Lt: 23#  Eval: Fastening/unfastening 3 buttons: 1:07.94 Physical performance test: PPT#2 (simulated eating) 13.57 sec & PPT#4 (donning/doffing jacket): 13.37sec (12/17/22)  COORDINATION: 9 Hole Peg test: Right: 1:10.91 sec; Left: 57.75 sec 01/14/23 R: 64.38 sec and L: 49.53 sec Box and Blocks:  Right 48 blocks, Left 46 blocks Tremors: Resting and Right  UE ROM:   01/07/23: Rt sh flex: 116*, Abd: 82*, ER: 75*, IR: 46*    12/24/22: Rt Sh:   Flexion:  100*, Abd: 105*, ER: 46* tender, IR 44*   UE MMT:   WFL  SENSATION: Decreased sensation in finger tips in B hands  COGNITION: Overall cognitive status: Within functional limits for tasks assessed  OBSERVATIONS: Bradykinesia   TODAY'S TREATMENT:                      03/03/23 PNF pattern reaching in standing with focus on large amplitude and balance during reaching down towards low surface and across midline to upper surface.  OT reiterating carryover of LSVT exercises to more functional tasks. Coordination: Attempted ball rotation in hand with rotating 2 balls clockwise and counter clockwise, attempted different sizes of small balls with pt unable to complete despite smaller circumference.  Transitioned to picking up various small objects/coins with focus on picking up from table top and not sliding.  Educated on functional carryover and use of large amplitude opening of hands when picking up and releasing of items.    02/24/23 Self-care: engaged in massed practice with UB dressing with variety of styles of shirts from pt's personal collection.  Pt demonstrating good breathing techniques when getting worked up when attempting to [employer], therefore succeeding in doffing without physical assistance.  OT providing demonstration and cues for alterative strategies for doffing shirt to  decrease risk of getting stuck.  Pt to go through clothing and eliminate more tight fitting shirts.  Pt donning/doffing jacket with difficulty with doffing, therefore will continue to benefit from education on large amplitude "cape technique" with donning/doffing jacket.    02/19/23 Back stretches: OT instructed pt in back stretches in standing and sitting with focus on alleviating low back pain.  Encouraged pt to sustain stretch as long as no increase in pain.  Pt preferring modified cat/cow stretch while standing with UE supported on counter top.  Pt did report noticing mild  decline in BP with standing activities, therefore educated on completing with UE support and/or chair placed behind pt to allow for her to sit as needed.  Also discussed modified head positioning to decrease major fluctuations in positioning.   Exercises - Seated Flexion Stretch  - 2-3 x daily - 3-5 reps - 5sec hold - Standing Lumbar Spine Flexion Stretch Counter  - 2-3 x daily - 1 sets - 10-15 reps - Seated Lumbar Extension  - 2-3 x daily - 3-5 reps - 5sec hold - Modified Cat Cow on Counter  - 2-3 x daily - 3-5 reps - 5 sec hold Self-care: reviewed and reiterated modifications to routine tasks to increase ease/safety/independence.  Pt reports utilizing modified techniques/adaptive strategies to increase ease/safety with picking up pills and exiting house.  Pt utilizing foam pad underneath pills to increase success with picking up meds.  Pt reports applying tape to steps to increase success with stepping pattern when exiting home. Simulated dressing: Pt continues to demonstrate decreased ease with undressing therefore completed simulated dressing with use of gait belt.  Pt able to don overhead and doff with increased time and cues to breathe through task.  Pt to bring in a shirt next session to practice, as pt reports getting stuck at scapulae.    BP assessed post back stretches in standing: 106/80.  HR 84.                                                           PATIENT EDUCATION: Education details: ongoing condition specific education, provided with handouts on community PD programs Person educated: Patient Education method: Explanation and Handouts Education comprehension: verbalized understanding and needs further education  HOME EXERCISE PROGRAM: Access Code: [email or web address] URL: [email or web address] Date: 12/24/2022 Prepared by: Fannie Knee   GOALS: Goals reviewed with patient? Yes  SHORT TERM GOALS: Target date: 01/02/23  Pt will be independent with PD specific  HEP. Baseline: needs plan Goal status: 01/02/23: MET-she states following along with LSVT exercises online at home.  2.  Pt will verbalize understanding of adapted strategies to maximize safety and I with ADLs/ IADLs. Baseline:  Goal status: 01/02/23: Progressing  3.  Pt will demonstrate improved fine motor coordination for ADLs as evidenced by decreasing 9 hole peg test score for RUE by 5 secs Baseline: 9 Hole Peg test: Right: 1:10.91 sec; Left: 57.75 sec Goal status: Met - R: 64.38 sec and L: 49.53 sec 01/14/23  4.  Pt will demonstrate improved grip strength as needed to mange clothing fasteners and open various household containers. Baseline: grip strength 15# Rt, 23# Lt Goal status: 01/02/23: Progressing    LONG TERM GOALS: Target date: 03/27/23  Pt will write a short paragraph with 100% legibility and no  significant decrease in letter size Baseline: 90% legibility with mild-mod decrease in size Goal status: IN PROGRESS  2.  Pt will demonstrate improved ease with feeding as evidenced by decreasing PPT#2 (self feeding) by 3 secs Baseline: 13.57 sec Goal status: IN PROGRESS  3.  Pt will demonstrate improved ease with UB dressing tasks (doffing shirt and donning/doffing jacket) with use of adaptive strategies and carryover of large amplitude movements.  Baseline:  Goal status: IN PROGRESS  ASSESSMENT:  CLINICAL IMPRESSION: Pt significantly more verbose this session and a little unsteady, however at the end of her PD meds cycle.  Pt demonstrating good, large amplitude movements during gross and fine motor control tasks this session.  Encouraged completion of [facility] tasks with household items and continued engagement in LSVT exercises.  PLAN:  OT FREQUENCY: 1-2x/week  OT DURATION: 8 weeks  PLANNED INTERVENTIONS: self care/ADL training, therapeutic exercise, therapeutic activity, neuromuscular re-education, manual therapy, passive range of motion, balance training, functional mobility  training, ultrasound, compression bandaging, moist heat, cryotherapy, contrast bath, patient/family education, psychosocial skills training, energy conservation, coping strategies training, and DME and/or AE instructions  CONSULTED AND AGREED WITH PLAN OF CARE: Patient  PLAN FOR NEXT SESSION:  Continue working on functional skills and improving UE motion and functional ADL ability.  Plan to further address UB dressing (and jacket on/off) as pt reports continued difficulty with thist. Handwriting, coordination, and large amplitude HEP.  F/u regarding back pain.   Rosalio Loud, OTR/L 03/03/2023, 3:52 PM

## 2023-03-11 ENCOUNTER — Ambulatory Visit: Payer: PPO | Admitting: Occupational Therapy

## 2023-03-11 DIAGNOSIS — M6281 Muscle weakness (generalized): Secondary | ICD-10-CM | POA: Diagnosis not present

## 2023-03-11 DIAGNOSIS — R278 Other lack of coordination: Secondary | ICD-10-CM

## 2023-03-11 DIAGNOSIS — R29818 Other symptoms and signs involving the nervous system: Secondary | ICD-10-CM

## 2023-03-11 NOTE — Therapy (Signed)
OUTPATIENT OCCUPATIONAL THERAPY [facility] TREATMENT NOTE  Patient Name: Ashley BODOH MRN: [id number] DOB:Jul 26, 1946, 77 y.o., female Today's Date: 03/11/2023  PCP: [name], NP REFERRING PROVIDER: [name], NP  END OF SESSION:  OT End of Session - 03/11/23 1110     Visit Number 14    Number of Visits 16    Date for OT Re-Evaluation 03/27/23    Authorization Type [employer]    OT Start Time 1106    OT Stop Time 1146    OT Time Calculation (min) 40 min    Activity Tolerance Patient tolerated treatment well;Patient limited by pain;Patient limited by fatigue    Behavior During Therapy [facility] for tasks assessed/performed;Restless                      Past Medical History:  Diagnosis Date   Anxiety    Basal cell carcinoma    Depression    Hypertension    Neuromuscular disorder (HCC)    Osteoarthritis    Osteopenia    Parkinson disease    Pneumonia    Tremors of nervous system    legs   uterine ca 10/08/2021   also endometrial cancer   Past Surgical History:  Procedure Laterality Date   CARPAL TUNNEL RELEASE Bilateral 1996/1997   CYSTOSCOPY N/A 10/08/2021   Procedure: CYSTOSCOPY;  Surgeon: Carver Fila, MD;  Location: [facility];  Service: Gynecology;  Laterality: N/A;   ROBOTIC ASSISTED TOTAL HYSTERECTOMY WITH BILATERAL SALPINGO OOPHERECTOMY N/A 10/08/2021   Procedure: XI ROBOTIC ASSISTED TOTAL HYSTERECTOMY WITH BILATERAL SALPINGO OOPHORECTOMY;SENTINEL LYMPH NODE INJECTION;  Surgeon: Carver Fila, MD;  Location: [facility];  Service: Gynecology;  Laterality: N/A;   SQUAMOUS CELL CARCINOMA EXCISION Left 2017   Left jawline   TUBAL LIGATION  1980's   Patient Active Problem List   Diagnosis Date Noted   Endometrial cancer (HCC) 10/16/2021   Complex atypical endometrial hyperplasia 09/27/2021   Primary osteoarthritis of both hands 05/14/2020   Primary osteoarthritis of both feet 05/14/2020   Age-related osteoporosis  without current pathological fracture 05/14/2020   Prediabetes 05/14/2020   Essential hypertension 05/14/2020   History of hyperlipidemia 05/14/2020   History of squamous cell carcinoma 05/14/2020   Parkinsonism 05/14/2020    ONSET DATE: referral date 11/14/22  REFERRING DIAG: G20.A1 (ICD-10-CM) - Parkinson's disease, unspecified whether dyskinesia present, unspecified whether manifestations fluctuate M54.16 (ICD-10-CM) - Lumbar radiculopathy  THERAPY DIAG:  Muscle weakness (generalized)  Other lack of coordination  Other symptoms and signs involving the nervous system  Rationale for Evaluation and Treatment: Rehabilitation  PERTINENT HISTORY: PD, HTN, osteoarthritis, chronic back pain, depression "I hate Parkinson's"  Pt reports that her mother's sister and pt's brother both had PD.  Pt reports past 2 years have been awful; with onset of back pain, uterine cancer s/p hysterectomy and lymph node removal, and persistent back ache.  Pt reports that she has difficulty with managing buttons, feeding herself (especially cutting).  Pt reports she has been going to a [facility] class at the senior center.    PRECAUTIONS: Fall ; WEIGHT BEARING RESTRICTIONS: No   SUBJECTIVE:   SUBJECTIVE STATEMENT: "I can't wait for my appt for my back, Sept 5th."   PAIN:  Are you having pain?   Yes: NPRS scale: 5/10 (chronic pain)  Pain location: pain in pelvis and lower back pain Pain description: radiating Aggravating factors: sitting Relieving factors: medication, xanax worked (but they are not prescribing it to her  anymore)  FALLS: Has patient fallen in last 6 months? Yes. 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   PATIENT GOALS: to be able to maintain typical living skills to remain in home   OBJECTIVE: (All objective assessments below are from initial evaluation on: 12/04/22 unless otherwise specified.)   HAND DOMINANCE: Right  ADLs: Transfers/ambulation related to ADLs: Independent Eating: typically will eat hand held items, difficulty with cutting foods Grooming: difficulty with styling hair UB Dressing: buttons are challenging, fastens bra in front LB Dressing: typically will lean up against something and complete in standing Toileting: Mod I, grab bar next to toilet Bathing: Mod I, has shower chair but doesn't use it Tub Shower transfers: had tub/shower remodeled to walk in shower Equipment: Shower seat with back, Grab bars, Walk in shower, Reacher, and Long handled shoe horn  IADLs: Shopping: order online, son will pick up for her Light housekeeping: reports that she can't clean as well as she used to Meal Prep: makes simple meals Community mobility: still driving Medication management: easy off caps, uses phone as reminder to take Handwriting: 90% legible, Increased time, Moderate micrographia, and PPT #1: 14.59 ([place])  MOBILITY STATUS: Hx of falls  POSTURE COMMENTS:  rounded shoulders  ACTIVITY TOLERANCE: Activity tolerance: WFL for tasks assessed on eval  FUNCTIONAL OUTCOME MEASURES:  12/17/22: Grip strength measures: Rt: 15#; Lt: 23#  Eval: Fastening/unfastening 3 buttons: 1:07.94 Physical performance test: PPT#2 (simulated eating) 13.57 sec & PPT#4 (donning/doffing jacket): 13.37sec (12/17/22)  COORDINATION: 9 Hole Peg test: Right: 1:10.91 sec; Left: 57.75 sec 01/14/23 R: 64.38 sec and L: 49.53 sec Box and Blocks:  Right 48 blocks, Left 46 blocks Tremors: Resting and Right  UE ROM:   01/07/23: Rt sh flex: 116*, Abd: 82*, ER: 75*,  IR: 46*   12/24/22: Rt Sh:   Flexion:  100*, Abd: 105*, ER: 46* tender, IR 44*   UE MMT:   WFL  SENSATION: Decreased sensation in finger tips in B hands  COGNITION: Overall cognitive status: Within functional limits for tasks assessed  OBSERVATIONS: Bradykinesia   TODAY'S TREATMENT:                      03/11/23 Back stretches: OT reviewed back stretches in standing and sitting with focus on alleviating low back pain.  Encouraged pt to sustain stretch as long as no increase in pain, recommending 10-15 seconds.  Pt preferring modified cat/cow stretch while standing with UE supported on counter top.  No c/o dizziness or lightheadedness today. Exercises - Seated Flexion Stretch  - 2-3 x daily - 3-5 reps - 15sec hold - Modified Cat Cow on Counter  - 2-3 x daily - 3-5 reps - 10 sec hold Large amplitude: engaged in modified LSVT big exercises with focus on forward reach with elbow extension and hands open with fingers extended and trunk rotation incorporating reaching across body with forearm supinated.  Noted difficulty and decreased ROM with supination. Therefore transitioned to card flipping with cues for forearm rotation/supination for massed practice.  Pt demonstrating increased difficulty with R side/shoulder compared to L.  03/03/23 PNF pattern reaching in standing with focus on large amplitude and balance during reaching down towards low surface and across midline to upper surface.  OT reiterating carryover of LSVT exercises to more functional tasks. Coordination: Attempted ball rotation in hand with rotating 2 balls clockwise and counter clockwise, attempted different sizes of small balls with pt unable to complete despite smaller circumference.  Transitioned to picking up various small objects/coins with focus on picking up from table top and not sliding.  Educated on functional carryover and use of large amplitude opening of hands when picking up and releasing of  items.    02/24/23 Self-care: engaged in massed practice with UB dressing with variety of styles of shirts from pt's personal collection.  Pt demonstrating good breathing techniques when getting worked up when attempting to [employer], therefore succeeding in doffing without physical assistance.  OT providing demonstration and cues for alterative strategies for doffing shirt to decrease risk of getting stuck.  Pt to go through clothing and eliminate more tight fitting shirts.  Pt donning/doffing jacket with difficulty with doffing, therefore will continue to benefit from education on large amplitude "cape technique" with donning/doffing jacket.                                                            PATIENT EDUCATION: Education details: ongoing condition specific education, provided with handouts on community PD programs Person educated: Patient Education method: Explanation and Handouts Education comprehension: verbalized understanding and needs further education  HOME EXERCISE PROGRAM: Access Code: [email or web address] URL: [email or web address] Date: 12/24/2022 Prepared by: Fannie Knee   GOALS: Goals reviewed with patient? Yes  SHORT TERM GOALS: Target date: 01/02/23  Pt will be independent with PD specific HEP. Baseline: needs plan Goal status: 01/02/23: MET-she states following along with LSVT exercises online at home.  2.  Pt will verbalize understanding of adapted strategies to maximize safety and I with ADLs/ IADLs. Baseline:  Goal status: 01/02/23: Progressing  3.  Pt will demonstrate improved fine motor coordination for ADLs as evidenced by decreasing 9 hole peg test score for RUE by 5 secs Baseline: 9 Hole Peg test: Right: 1:10.91 sec; Left: 57.75 sec Goal status: Met - R: 64.38 sec and L: 49.53 sec 01/14/23  4.  Pt will demonstrate improved grip strength as needed to mange clothing fasteners and open various household containers. Baseline: grip strength 15# Rt,  23# Lt Goal status: 01/02/23: Progressing    LONG TERM GOALS: Target date: 03/27/23  Pt will write a short paragraph with 100% legibility and no significant decrease in letter size Baseline: 90% legibility with mild-mod decrease in size Goal status: IN PROGRESS  2.  Pt will demonstrate improved ease with feeding as evidenced by decreasing PPT#2 (self feeding) by 3 secs Baseline: 13.57 sec Goal status: IN PROGRESS  3.  Pt will demonstrate improved ease with UB dressing tasks (doffing shirt and donning/doffing jacket) with use of adaptive strategies and carryover of large amplitude movements.  Baseline:  Goal status: IN PROGRESS  ASSESSMENT:  CLINICAL IMPRESSION: Pt demonstrating good carryover of education with focus on forearm supination with large amplitude movements. Pt demonstrating increased difficulty, decreased amplitude with RUE > LUE.  Reiterated completion of back stretches as well as repositioning as needed to decrease onset of back pain.  PLAN:  OT FREQUENCY: 1-2x/week  OT DURATION: 8 weeks  PLANNED INTERVENTIONS: self care/ADL training, therapeutic exercise, therapeutic activity, neuromuscular re-education, manual therapy, passive range of motion, balance training, functional mobility training, ultrasound, compression bandaging, moist heat, cryotherapy, contrast bath, patient/family education, psychosocial skills training, energy conservation, coping strategies training, and DME and/or AE instructions  CONSULTED AND AGREED WITH PLAN OF CARE: Patient  PLAN FOR NEXT SESSION:  Continue working on functional skills and improving UE motion and functional ADL ability.  Plan to further address UB dressing (and jacket on/off) as pt reports continued difficulty with this. Handwriting, coordination, and large amplitude HEP.  F/u regarding back pain.   Rhian Asebedo, OTR/L 03/11/2023, 11:10 AM

## 2023-03-13 ENCOUNTER — Encounter: Payer: Self-pay | Admitting: Gynecologic Oncology

## 2023-03-13 ENCOUNTER — Other Ambulatory Visit: Payer: Self-pay

## 2023-03-13 ENCOUNTER — Inpatient Hospital Stay: Payer: PPO | Attending: Gynecologic Oncology | Admitting: Gynecologic Oncology

## 2023-03-13 VITALS — BP 127/76 | HR 80 | Temp 98.2°F | Resp 18 | Ht 61.22 in

## 2023-03-13 DIAGNOSIS — M62838 Other muscle spasm: Secondary | ICD-10-CM | POA: Diagnosis not present

## 2023-03-13 DIAGNOSIS — Z923 Personal history of irradiation: Secondary | ICD-10-CM | POA: Diagnosis not present

## 2023-03-13 DIAGNOSIS — Z90722 Acquired absence of ovaries, bilateral: Secondary | ICD-10-CM | POA: Insufficient documentation

## 2023-03-13 DIAGNOSIS — M533 Sacrococcygeal disorders, not elsewhere classified: Secondary | ICD-10-CM

## 2023-03-13 DIAGNOSIS — C541 Malignant neoplasm of endometrium: Secondary | ICD-10-CM

## 2023-03-13 DIAGNOSIS — Z8542 Personal history of malignant neoplasm of other parts of uterus: Secondary | ICD-10-CM | POA: Diagnosis not present

## 2023-03-13 DIAGNOSIS — G20A1 Parkinson's disease without dyskinesia, without mention of fluctuations: Secondary | ICD-10-CM | POA: Diagnosis not present

## 2023-03-13 DIAGNOSIS — Z90710 Acquired absence of both cervix and uterus: Secondary | ICD-10-CM | POA: Insufficient documentation

## 2023-03-13 DIAGNOSIS — M549 Dorsalgia, unspecified: Secondary | ICD-10-CM | POA: Diagnosis not present

## 2023-03-13 NOTE — Patient Instructions (Signed)
It was good to see you today.  I do not see or feel any evidence of cancer recurrence on your exam.  We will see you for follow-up in 3 months; you will see Melissa at this next visit and I will see you back in 6 months.  As always, if you develop any new and concerning symptoms before your next visit, please call to see me sooner.

## 2023-03-13 NOTE — Progress Notes (Signed)
Gynecologic Oncology Return Clinic Visit  03/13/23  Reason for Visit: Surveillance in the setting of endometrial cancer   Treatment History: Oncology History Overview Note  MMR IHC intact MS stable   Endometrial cancer (HCC)  08/31/2021 Imaging   MRI of the pelvis performed for low back pain on 08/31/2021 with incidental finding of endometrial thickening up to 1 cm, suboptimally evaluated.  There is no pelvic free fluid.  No adenopathy.  2.5 cm right ovarian cyst noted.  Other MRI findings: no hip fracture, dislocation or avascular necrosis.  There is mild osteoarthritis of bilateral hips.  There is a large high-grade partial-thickness tear of the left hamstring origin.  Partial-thickness tear of the left gluteus medius tendon insertion.  Mild osteoarthritis of bilateral SI joints also noted.   09/04/2021 Imaging   Pelvic ultrasound exam on 09/04/2021 shows a uterus measuring 6.4 x 3.1 x 4.2 cm with an endometrial lining of 1.1 cm with fluid.  Irregular appearance of the right ovary with simple follicle measuring up to 5 mm, avascular.  Simple cyst adjacent to the right ovary measures up to 2.7 cm and also avascular.  Left ovary noted to be normal.   09/12/2021 Initial Biopsy   EMB: showed rare atypical glands, predominantly mucus with few admixed benign endocervical glands.  Although the rare glandular fragments with cribriform pattern and mild cytologic atypia are extremely scant, they are concerning for at least endometrial hyperplasia.   10/08/2021 Surgery   TRH/BSO, SLN injection with no mapping bilaterally, cystoscopy No residual hyperplasia seen on frozen, no malignancy   Findings:  On EUA, small mobile uterus. Normal upper abdominal survey. Normal appearing omentum and small and large bowel. Normal appendix. Uterus 8cm and normal in appearance with small fundal fibroid. Normal bilateral adnexa with evidence of prior BTL. ICG seen along posterior cervix, mapping not appreciated in either  pelvic LN basin. No obvious adenopathy. Given some difficulty with the uterine manipulator and trouble initially identifying plane between bladder and LUS and cervix, cystoscopy was performed. Bladder dome intact, good efflux noted from bilateral ureteral orifices.    10/08/2021 Pathology Results   A. UTERUS, CERVIX, BILATERAL FALLOPIAN TUBES AND OVARIES:  Invasive well differentiated mucinous endometrial adenocarcinoma, FIGO 1  Tumor invades greater than 50% of the myometrium (11 mm /18 mm)  Tumor arises within complex atypical mucinous hyperplasia involving a  polyp and the anterior and posterior endometrium  Adenomyosis  Background inactive endometrium with cystic change  Benign serous cyst papillary adenofibroma of right ovary  Chronic cervicitis with nabothian cysts  Benign left ovary and right and left fallopian tubes   ONCOLOGY TABLE:   UTERUS, CARCINOMA OR CARCINOSARCOMA: Resection   Procedure: Total hysterectomy and bilateral salpingo-oophorectomy  Histologic Type: Mucinous endometrial adenocarcinoma  Histologic Grade: Well differentiated, FIGO 1  Myometrial Invasion: Present       Depth of Myometrial Invasion (mm): 11 mm       Myometrial Thickness (mm): 18 mm       Percentage of Myometrial Invasion: 61%  Uterine Serosa Involvement: Not identified  Cervical stromal Involvement:[Not identified  Extent of involvement of other tissue/organs: Not identified  Peritoneal/Ascitic Fluid: Not submitted  Lymphovascular Invasion: Not identified  Regional Lymph Nodes: Not applicable (no lymph nodes submitted or found)   Distant Metastasis: Not applicable  Pathologic Stage Classification (pTNM, AJCC 8th Edition): pT1b, pN n/a  Ancillary Studies: MMR / MSI testing will be ordered  Representative Tumor Block: A5, A6  Comment(s): None    10/16/2021  Initial Diagnosis   Endometrial cancer (HCC)   11/06/2021 Surgery   Robotic-assisted bilateral pelvic lymph node dissection, right  para-aortic lymph node dissection, cystoscopy  Findings: On EUA, well-healed vaginal cuff.  Normal upper abdominal survey.  Normal-appearing omentum, small and large bowel.  Some filmy adhesions of the sigmoid epiploica to the left pelvic sidewall.  Significant fibrosis noted of bilateral pelvic sidewalls and the retroperitoneal spaces.  Nodular tissue along some areas recent surgical dissection, presumed to be inflammatory from surgery.  No obvious adenopathy.  Fibrosis made pelvic lymph node dissection very challenging with significant adherence of minimal lymphatic tissue to the right external iliac vein.  Anatomy quite challenging to delineate in the setting of this fibrosis as well as recent surgery.  Along the para-aortic lymph node beds, no significant tissue noted along the left aspect and due to retroperitoneal fibrosis, further dissection did not seem safe.  Along the right, what was presumed to be the IVC was displaced laterally.  Cystoscopy, bladder dome noted to be intact, good efflux noted from bilateral ureteral orifices.   11/06/2021 Pathology Results   A. LYMPH NODE, LEFT PELVIC, EXCISION:  - Four lymph nodes, negative for carcinoma (0/4)   B. PERITONEUM, BIOPSY:  - Peritonealized soft tissue with focal fat necrosis  - Negative for carcinoma   C. LYMPH NODE, RIGHT PELVIC, EXCISION:  - Five lymph nodes, negative for carcinoma (0/5)   D. ROUND LIGAMENT REMNANT, EXCISION:  - Negative for carcinoma   E. PARA-AORTIC, RIGHT, EXCISION:  - Lymph node, negative for carcinoma (0   12/18/2021 - 01/15/2022 Radiation Therapy   Vaginal brachytherapy - 30 Gy     Interval History: Continues to struggle significantly in terms of back pain.  Has seen many specialist and notes some change in her back pain and symptoms after most recent series of shots but now has developed worsening muscle spasms.  She is going to [facility] clinic next month.  She is working on PT and OT in the setting of her  Parkinson's.  Was having some difficulty with swallowing and will be starting speech therapy soon.  Denies any vaginal bleeding or discharge.  Uses her dilator when she remembers to.  Has not needed laxatives for the last few months and is having regular bowel function.  She continues to do yoga either with online classes or the senior center.  Past Medical/Surgical History: Past Medical History:  Diagnosis Date   Anxiety    Basal cell carcinoma    Depression    Hypertension    Neuromuscular disorder (HCC)    Osteoarthritis    Osteopenia    Parkinson disease    Pneumonia    Tremors of nervous system    legs   uterine ca 10/08/2021   also endometrial cancer    Past Surgical History:  Procedure Laterality Date   CARPAL TUNNEL RELEASE Bilateral 1996/1997   CYSTOSCOPY N/A 10/08/2021   Procedure: CYSTOSCOPY;  Surgeon: Carver Fila, MD;  Location: [facility];  Service: Gynecology;  Laterality: N/A;   ROBOTIC ASSISTED TOTAL HYSTERECTOMY WITH BILATERAL SALPINGO OOPHERECTOMY N/A 10/08/2021   Procedure: XI ROBOTIC ASSISTED TOTAL HYSTERECTOMY WITH BILATERAL SALPINGO OOPHORECTOMY;SENTINEL LYMPH NODE INJECTION;  Surgeon: Carver Fila, MD;  Location: [facility];  Service: Gynecology;  Laterality: N/A;   SQUAMOUS CELL CARCINOMA EXCISION Left 2017   Left jawline   TUBAL LIGATION  1980's    Family History  Problem Relation Age of Onset   Endometrial cancer Mother  Diabetes Mother    Alzheimer's disease Father    Coronary artery disease Father    Diabetes Sister    COPD Sister    Diabetes Sister    Heart disease Brother    Other Brother        MVA    Parkinson's disease Brother    Heart disease Brother    Alcohol abuse Brother    Heart disease Brother    Parkinson's disease Maternal Aunt    Breast cancer Maternal Aunt    Colon cancer Paternal Aunt    Parkinson's disease Paternal Aunt    Lung cancer Paternal Uncle    Healthy Son    Healthy Son    Prostate cancer Neg Hx     Pancreatic cancer Neg Hx    Ovarian cancer Neg Hx     Social History   Socioeconomic History   Marital status: Married    Spouse name: Not on file   Number of children: Not on file   Years of education: Not on file   Highest education level: Bachelor's degree (e.g., BA, AB, BS)  Occupational History   Occupation: retired    Comment: Advertising account planner  Tobacco Use   Smoking status: Never    Passive exposure: Never   Smokeless tobacco: Never  Vaping Use   Vaping status: Never Used  Substance and Sexual Activity   Alcohol use: Yes    Comment: rarely   Drug use: Not Currently   Sexual activity: Not Currently  Other Topics Concern   Not on file  Social History Narrative   Not on file   Social Determinants of Health   Financial Resource Strain: Low Risk  (02/12/2023)   Overall Financial Resource Strain (CARDIA)    Difficulty of Paying Living Expenses: Not very hard  Food Insecurity: No Food Insecurity (02/12/2023)   Hunger Vital Sign    Worried About Running Out of Food in the Last Year: Never true    Ran Out of Food in the Last Year: Never true  Transportation Needs: No Transportation Needs (02/12/2023)   PRAPARE - Administrator, Civil Service (Medical): No    Lack of Transportation (Non-Medical): No  Physical Activity: Sufficiently Active (02/12/2023)   Exercise Vital Sign    Days of Exercise per Week: 5 days    Minutes of Exercise per Session: 40 min  Stress: Stress Concern Present (02/12/2023)   [employer] of Occupational Health - Occupational Stress Questionnaire    Feeling of Stress : To some extent  Social Connections: Unknown (02/12/2023)   Social Connection and Isolation Panel [NHANES]    Frequency of Communication with Friends and Family: More than three times a week    Frequency of Social Gatherings with Friends and Family: More than three times a week    Attends Religious Services: Not on Marketing executive or Organizations: Yes     Attends Banker Meetings: More than 4 times per year    Marital Status: Married    Current Medications:  Current Outpatient Medications:    Acetaminophen (TYLENOL PO), Take 1 tablet by mouth as needed., Disp: , Rfl:    bisacodyl (DULCOLAX) 5 MG EC tablet, Take by mouth., Disp: , Rfl:    carbidopa-levodopa (SINEMET IR) 25-100 MG tablet, Take 1.5 tablets by mouth 5 (five) times daily., Disp: , Rfl:    escitalopram (LEXAPRO) 5 MG tablet, Take 5 mg by mouth daily., Disp: , Rfl:  lisinopril (ZESTRIL) 10 MG tablet, Take 10 mg by mouth at bedtime as needed (Take if blood pressure > 150/90 at bedtime.)., Disp: , Rfl:    Multiple Vitamin (MULTIVITAMIN) tablet, Take 1 tablet by mouth daily., Disp: , Rfl:    polyethylene glycol powder (GLYCOLAX/MIRALAX) 17 GM/SCOOP powder, Take 17 g by mouth as needed., Disp: , Rfl:   Review of Systems: + Fatigue, hearing loss, voice changes, pelvic pain, joint pain, back pain, muscle pain/cramp, dizziness, problems walking, anxiety, depression, decreased concentration. Denies appetite changes, fevers, chills, unexplained weight changes. Denies neck lumps or masses, mouth sores, ringing in ears. Denies cough or wheezing.  Denies shortness of breath. Denies chest pain or palpitations. Denies leg swelling. Denies abdominal distention, pain, blood in stools, constipation, diarrhea, nausea, vomiting, or early satiety. Denies pain with intercourse, dysuria, frequency, hematuria or incontinence. Denies hot flashes, vaginal bleeding or vaginal discharge.   Denies itching, rash, or wounds. Denies headaches, numbness or seizures. Denies swollen lymph nodes or glands, denies easy bruising or bleeding.  Physical Exam: BP 127/76 (BP Location: Right Arm, Patient Position: Sitting)   Pulse 80   Temp 98.2 F (36.8 C) (Tympanic)   Resp 18   Ht 5' 1.22" (1.555 m)   SpO2 98%   BMI 28.89 kg/m  General: Alert, oriented, no acute distress. HEENT: Normocephalic,  atraumatic, sclera anicteric. Chest: Clear to auscultation bilaterally.  Wheezes or rhonchi. Cardiovascular: Regular rate and rhythm, no murmurs. Abdomen: soft, nontender.  Normoactive bowel sounds.  No masses or hepatosplenomegaly appreciated.  Well-healed incisions. Extremities: Grossly normal range of motion.  Warm, well perfused.  No edema bilaterally. Skin: No rashes or lesions noted. Lymphatics: No cervical, supraclavicular, or inguinal adenopathy. GU: Normal appearing external genitalia without erythema, excoriation, or lesions.  Speculum exam reveals moderately atrophic vaginal mucosa, some radiation changes noted at the vaginal apex.  Bimanual exam reveals no nodularity or masses.  Rectovaginal exam confirms findings.  Laboratory & Radiologic Studies: None new  Assessment & Plan: FELIPA LAROCHE is a 77 y.o. woman with Stage IB grade 1 mucinous endometrial adenocarcinoma who presents for follow-up after completion of adjuvant vaginal brachytherapy in 01/2022. MSS, MMRp.   Patient is doing well from a cancer standpoint; she is NED on exam today.   She continues to be fairly debilitated by her back pain.  Nothing has seemed to help.  She is scheduled to see [facility] spine clinic next month.  Support given.   We reviewed NCCN surveillance recommendations.  In the setting of high-intermediate risk, after completion of adjuvant treatment, I reviewed recommendation for surveillance visits every 3 months for 2-3 years.  Her radiation oncologist is retiring.  We will plan to see her every 3 months in our clinic alternating between myself and Melissa.  She will see Melissa for her next visit.  We discussed signs and symptoms that would be concerning for cancer recurrence and I stressed the importance of calling if she develops any of these.   Patient encouraged to continue using her vaginal dilator regularly.  22 minutes of total time was spent for this patient encounter, including preparation,  face-to-face counseling with the patient and coordination of care, and documentation of the encounter.  Eugene Garnet, MD  Division of Gynecologic Oncology  Department of Obstetrics and Gynecology  [facility]

## 2023-03-16 ENCOUNTER — Ambulatory Visit (INDEPENDENT_AMBULATORY_CARE_PROVIDER_SITE_OTHER): Payer: PPO | Admitting: Nurse Practitioner

## 2023-03-16 ENCOUNTER — Encounter: Payer: Self-pay | Admitting: Nurse Practitioner

## 2023-03-16 ENCOUNTER — Ambulatory Visit: Payer: PPO | Admitting: Nurse Practitioner

## 2023-03-16 VITALS — BP 138/86 | HR 93 | Temp 97.4°F | Ht 61.0 in | Wt 151.0 lb

## 2023-03-16 DIAGNOSIS — F419 Anxiety disorder, unspecified: Secondary | ICD-10-CM | POA: Diagnosis not present

## 2023-03-16 DIAGNOSIS — R079 Chest pain, unspecified: Secondary | ICD-10-CM

## 2023-03-16 DIAGNOSIS — M5416 Radiculopathy, lumbar region: Secondary | ICD-10-CM

## 2023-03-16 DIAGNOSIS — G20A1 Parkinson's disease without dyskinesia, without mention of fluctuations: Secondary | ICD-10-CM

## 2023-03-16 DIAGNOSIS — I951 Orthostatic hypotension: Secondary | ICD-10-CM | POA: Diagnosis not present

## 2023-03-16 MED ORDER — PREGABALIN 75 MG PO CAPS: 75.0000 mg | ORAL_CAPSULE | Freq: Two times a day (BID) | ORAL | 1 refills | Status: DC

## 2023-03-16 NOTE — Patient Instructions (Signed)
Start lyric 75 mg by mouth twice daily for back pain- notify for side effects

## 2023-03-16 NOTE — Progress Notes (Unsigned)
Careteam: Patient Care Team: [name], NP as PCP - General (Geriatric Medicine) Corky Crafts, MD as PCP - Cardiology (Cardiology)  PLACE OF SERVICE:  [facility]  Advanced Directive information    Allergies  Allergen Reactions   Codeine Other (See Comments)    Nausea     Chief Complaint  Patient presents with   Acute Visit    Blood pressure follow-up. 7/10 back pain, will see doctor in [place] in September for back/spine concerns (FYI).      HPI: Patient is a 77 y.o. female for blood pressure follow up.   Going to [place] for radiation oncology but oncologist retiring, she plans to continue her follow up in [place].  Had radiation to reduce recurrence- she has completed radiation  She is in her 3rd year of daily back pain. Following with spine specialist in [place]. Her parkinson neurologist referred her.  She saw Dr Lovell Sheehan in the past.  Gabapentin gave her a weird sensation in her ear.  Burning, throbbing, stabbing pain.  Had epidural injections in 2023 which were not effective.  in PT due to her parkinson's disease and ST  She was wondering if she could take lisinopril at night.  In June she increased sinemet IR to 2 tablets 5 times daily.   Yesterday morning noticed chest pain/tightness under her sternum.  Reports she did have some diaphoresis with event but went away quickly and did not reoccur. No shortness of breath associated with event.   Review of Systems:  Review of Systems  Constitutional:  Negative for chills, fever and weight loss.  HENT:  Negative for tinnitus.   Respiratory:  Negative for cough, sputum production and shortness of breath.   Cardiovascular:  Positive for chest pain. Negative for palpitations and leg swelling.  Gastrointestinal:  Negative for abdominal pain, constipation, diarrhea and heartburn.  Genitourinary:  Negative for dysuria, frequency and urgency.  Musculoskeletal:  Positive for neck pain.  Negative for back pain, falls, joint pain and myalgias.  Skin: Negative.   Neurological:  Positive for sensory change. Negative for dizziness and headaches.  Psychiatric/Behavioral:  Negative for depression and memory loss. The patient is nervous/anxious. The patient does not have insomnia.   ***  Past Medical History:  Diagnosis Date   Anxiety    Basal cell carcinoma    Depression    Hypertension    Neuromuscular disorder (HCC)    Osteoarthritis    Osteopenia    Parkinson disease    Pneumonia    Tremors of nervous system    legs   uterine ca 10/08/2021   also endometrial cancer   Past Surgical History:  Procedure Laterality Date   CARPAL TUNNEL RELEASE Bilateral 1996/1997   CYSTOSCOPY N/A 10/08/2021   Procedure: CYSTOSCOPY;  Surgeon: Carver Fila, MD;  Location: [facility];  Service: Gynecology;  Laterality: N/A;   ROBOTIC ASSISTED TOTAL HYSTERECTOMY WITH BILATERAL SALPINGO OOPHERECTOMY N/A 10/08/2021   Procedure: XI ROBOTIC ASSISTED TOTAL HYSTERECTOMY WITH BILATERAL SALPINGO OOPHORECTOMY;SENTINEL LYMPH NODE INJECTION;  Surgeon: Carver Fila, MD;  Location: [facility];  Service: Gynecology;  Laterality: N/A;   SQUAMOUS CELL CARCINOMA EXCISION Left 2017   Left jawline   TUBAL LIGATION  1980's   Social History:   reports that she has never smoked. She has never been exposed to tobacco smoke. She has never used smokeless tobacco. She reports current alcohol use. She reports that she does not currently use drugs.  Family History  Problem Relation Age of Onset   Endometrial cancer Mother    Diabetes Mother    Alzheimer's disease Father    Coronary artery disease Father    Diabetes Sister    COPD Sister    Diabetes Sister    Heart disease Brother    Other Brother        MVA    Parkinson's disease Brother    Heart disease Brother    Alcohol abuse Brother    Heart disease Brother    Parkinson's disease Maternal Aunt    Breast cancer Maternal Aunt    Colon cancer  Paternal Aunt    Parkinson's disease Paternal Aunt    Lung cancer Paternal Uncle    Healthy Son    Healthy Son    Prostate cancer Neg Hx    Pancreatic cancer Neg Hx    Ovarian cancer Neg Hx     Medications: Patient's Medications  New Prescriptions   No medications on file  Previous Medications   ACETAMINOPHEN (TYLENOL PO)    Take 1 tablet by mouth as needed.   BISACODYL (DULCOLAX) 5 MG EC TABLET    Take 5 mg by mouth as needed.   CARBIDOPA-LEVODOPA (SINEMET IR) 25-100 MG TABLET    Take 2 tablets by mouth 5 (five) times daily.   ESCITALOPRAM (LEXAPRO) 5 MG TABLET    Take 5 mg by mouth daily.   LISINOPRIL (ZESTRIL) 10 MG TABLET    Take 10 mg by mouth at bedtime as needed (Take if blood pressure > 150/90 at bedtime.).   POLYETHYLENE GLYCOL POWDER (GLYCOLAX/MIRALAX) 17 GM/SCOOP POWDER    Take 17 g by mouth as needed.  Modified Medications   No medications on file  Discontinued Medications   MULTIPLE VITAMIN (MULTIVITAMIN) TABLET    Take 1 tablet by mouth daily.    Physical Exam:  Vitals:   03/16/23 0850  BP: 138/86  Pulse: 93  Temp: (!) 97.4 F (36.3 C)  TempSrc: Temporal  SpO2: 96%  Weight: 151 lb (68.5 kg)  Height: 5\' 1"  (1.549 m)   Body mass index is 28.53 kg/m. Wt Readings from Last 3 Encounters:  03/16/23 151 lb (68.5 kg)  02/16/23 154 lb (69.9 kg)  12/18/22 147 lb (66.7 kg)    Physical Exam Constitutional:      General: She is not in acute distress.    Appearance: She is well-developed. She is not diaphoretic.  HENT:     Head: Normocephalic and atraumatic.     Mouth/Throat:     Pharynx: No oropharyngeal exudate.  Eyes:     Conjunctiva/sclera: Conjunctivae normal.     Pupils: Pupils are equal, round, and reactive to light.  Cardiovascular:     Rate and Rhythm: Normal rate and regular rhythm.     Heart sounds: Normal heart sounds.  Pulmonary:     Effort: Pulmonary effort is normal.     Breath sounds: Normal breath sounds.  Abdominal:     General:  Bowel sounds are normal.     Palpations: Abdomen is soft.  Musculoskeletal:     Cervical back: Normal range of motion and neck supple.     Right lower leg: No edema.     Left lower leg: No edema.  Skin:    General: Skin is warm and dry.  Neurological:     Mental Status: She is alert.  Psychiatric:        Mood and Affect: Mood normal.   ***  Labs reviewed:  Basic Metabolic Panel: Recent Labs    11/14/22 1427  NA 144  K 3.6  CL 104  CO2 31  GLUCOSE 127  BUN 13  CREATININE 0.82  CALCIUM 9.7   Liver Function Tests: Recent Labs    11/14/22 1427  AST 12  ALT 5*  BILITOT 0.5  PROT 6.2   No results for input(s): "LIPASE", "AMYLASE" in the last 8760 hours. No results for input(s): "AMMONIA" in the last 8760 hours. CBC: Recent Labs    11/14/22 1427  WBC 5.8  NEUTROABS 3,608  HGB 12.9  HCT 38.0  MCV 91.6  PLT 249   Lipid Panel: Recent Labs    11/14/22 1427  CHOL 190  HDL 53  LDLCALC 86  TRIG 386*  CHOLHDL 3.6   TSH: No results for input(s): "TSH" in the last 8760 hours. A1C: No results found for: "HGBA1C"   Assessment/Plan 1. Lumbar radiculopathy *** - pregabalin (LYRICA) 75 MG capsule; Take 1 capsule (75 mg total) by mouth 2 (two) times daily.  Dispense: 60 capsule; Refill: 1  2. Chest pain, unspecified type *** - EKG 12-Lead  3. Orthostatic hypotension ***  4. Parkinson's disease, unspecified whether dyskinesia present, unspecified whether manifestations fluctuate ***  5. Anxiety ***    No follow-ups on file.: ***   K. Biagio [facility] [phone]

## 2023-03-17 ENCOUNTER — Ambulatory Visit: Payer: PPO | Attending: Nurse Practitioner | Admitting: Occupational Therapy

## 2023-03-17 DIAGNOSIS — R278 Other lack of coordination: Secondary | ICD-10-CM | POA: Insufficient documentation

## 2023-03-17 DIAGNOSIS — R279 Unspecified lack of coordination: Secondary | ICD-10-CM | POA: Insufficient documentation

## 2023-03-17 DIAGNOSIS — R208 Other disturbances of skin sensation: Secondary | ICD-10-CM | POA: Diagnosis not present

## 2023-03-17 DIAGNOSIS — R2681 Unsteadiness on feet: Secondary | ICD-10-CM | POA: Insufficient documentation

## 2023-03-17 DIAGNOSIS — M6281 Muscle weakness (generalized): Secondary | ICD-10-CM | POA: Diagnosis not present

## 2023-03-17 DIAGNOSIS — R1310 Dysphagia, unspecified: Secondary | ICD-10-CM | POA: Diagnosis not present

## 2023-03-17 DIAGNOSIS — R471 Dysarthria and anarthria: Secondary | ICD-10-CM | POA: Insufficient documentation

## 2023-03-17 DIAGNOSIS — R29818 Other symptoms and signs involving the nervous system: Secondary | ICD-10-CM | POA: Insufficient documentation

## 2023-03-17 NOTE — Therapy (Signed)
OUTPATIENT OCCUPATIONAL THERAPY [facility] TREATMENT NOTE  Patient Name: Ashley Neal MRN: [id number] DOB:[birth year], 77 y.o., female Today's Date: 03/17/2023  PCP: [name], NP REFERRING PROVIDER: [name], NP  END OF SESSION:  OT End of Session - 03/17/23 1321     Visit Number 15    Number of Visits 16    Date for OT Re-Evaluation 03/27/23    Authorization Type [employer]    OT Start Time 1317    OT Stop Time 1400    OT Time Calculation (min) 43 min    Activity Tolerance Patient tolerated treatment well;Patient limited by pain;Patient limited by fatigue    Behavior During Therapy [facility] for tasks assessed/performed;Restless                       Past Medical History:  Diagnosis Date   Anxiety    Basal cell carcinoma    Depression    Hypertension    Neuromuscular disorder (HCC)    Osteoarthritis    Osteopenia    Parkinson disease    Pneumonia    Tremors of nervous system    legs   uterine ca 10/08/2021   also endometrial cancer   Past Surgical History:  Procedure Laterality Date   CARPAL TUNNEL RELEASE Bilateral 1996/1997   CYSTOSCOPY N/A 10/08/2021   Procedure: CYSTOSCOPY;  Surgeon: Carver Fila, MD;  Location: [facility];  Service: Gynecology;  Laterality: N/A;   ROBOTIC ASSISTED TOTAL HYSTERECTOMY WITH BILATERAL SALPINGO OOPHERECTOMY N/A 10/08/2021   Procedure: XI ROBOTIC ASSISTED TOTAL HYSTERECTOMY WITH BILATERAL SALPINGO OOPHORECTOMY;SENTINEL LYMPH NODE INJECTION;  Surgeon: Carver Fila, MD;  Location: [facility];  Service: Gynecology;  Laterality: N/A;   SQUAMOUS CELL CARCINOMA EXCISION Left 2017   Left jawline   TUBAL LIGATION  1980's   Patient Active Problem List   Diagnosis Date Noted   Endometrial cancer (HCC) 10/16/2021   Complex atypical endometrial hyperplasia 09/27/2021   Primary osteoarthritis of both hands 05/14/2020   Primary osteoarthritis of both feet 05/14/2020   Age-related osteoporosis  without current pathological fracture 05/14/2020   Prediabetes 05/14/2020   Essential hypertension 05/14/2020   History of hyperlipidemia 05/14/2020   History of squamous cell carcinoma 05/14/2020   Parkinsonism 05/14/2020    ONSET DATE: referral date 11/14/22  REFERRING DIAG: G20.A1 (ICD-10-CM) - Parkinson's disease, unspecified whether dyskinesia present, unspecified whether manifestations fluctuate M54.16 (ICD-10-CM) - Lumbar radiculopathy  THERAPY DIAG:  Muscle weakness (generalized)  Other lack of coordination  Other symptoms and signs involving the nervous system  Unspecified lack of coordination  Other disturbances of skin sensation  Unsteadiness on feet  Rationale for Evaluation and Treatment: Rehabilitation  PERTINENT HISTORY: PD, HTN, osteoarthritis, chronic back pain, depression "I hate Parkinson's"  Pt reports that her mother's sister and pt's brother both had PD.  Pt reports past 2 years have been awful; with onset of back pain, uterine cancer s/p hysterectomy and lymph node removal, and persistent back ache.  Pt reports that she has difficulty with managing buttons, feeding herself (especially cutting).  Pt reports she has been going [facility] a [facility] the [facility] center.    PRECAUTIONS: Fall ; WEIGHT BEARING RESTRICTIONS: No   SUBJECTIVE:   SUBJECTIVE STATEMENT: Pt reports noticing changes in her voice as well as increased dyskinesia, especially when she is trying to fall asleep at night.  Pt reports picking up lyrica (new prescription) but has not yet taken due to instructed not to  drive while taking and has appts today.   PAIN:  Are you having pain?   Yes: NPRS scale: 5/10 (chronic pain)  Pain location: pain in pelvis and lower back pain Pain description: radiating Aggravating factors: sitting Relieving factors: medication, xanax worked (but they are not prescribing it to her anymore)  FALLS: Has patient fallen in last 6 months? Yes. 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   PATIENT GOALS: to be able to maintain typical living skills to remain in home   OBJECTIVE: (All objective assessments below are from initial evaluation on: 12/04/22 unless otherwise specified.)   HAND DOMINANCE: Right  ADLs: Transfers/ambulation related to ADLs: Independent Eating: typically will eat hand held items, difficulty with cutting foods Grooming: difficulty with styling hair UB Dressing: buttons are challenging, fastens bra in front LB Dressing: typically will lean up against something and complete in standing Toileting: Mod I, grab bar next to toilet Bathing: Mod I, has shower chair but doesn't use it Tub Shower transfers: had tub/shower remodeled to walk in shower Equipment: Shower seat with back, Grab bars, Walk in shower, Reacher, and Long handled shoe horn  IADLs: Shopping: order online, son will pick up for her Light housekeeping: reports that she can't clean as well as she used to Meal Prep: makes simple meals Community mobility: still driving Medication management: easy off caps, uses phone as reminder to take Handwriting: 90% legible, Increased time, Moderate micrographia, and PPT #1: 14.59 ([place])  MOBILITY STATUS: Hx of falls  POSTURE COMMENTS:  rounded shoulders  ACTIVITY TOLERANCE: Activity tolerance: WFL for tasks assessed on eval  FUNCTIONAL OUTCOME MEASURES:  12/17/22: Grip strength measures: Rt: 15#; Lt: 23#  Eval: Fastening/unfastening 3 buttons: 1:07.94 Physical performance test: PPT#2 (simulated  eating) 13.57 sec & PPT#4 (donning/doffing jacket): 13.37sec (12/17/22)  COORDINATION: 9 Hole Peg test: Right: 1:10.91 sec; Left: 57.75 sec 01/14/23 R: 64.38 sec and L: 49.53 sec Box and Blocks:  Right 48 blocks, Left 46 blocks Tremors: Resting and Right  UE ROM:   01/07/23: Rt sh flex: 116*, Abd: 82*, ER: 75*, IR: 46*   12/24/22: Rt Sh:   Flexion:  100*, Abd: 105*, ER: 46* tender, IR 44*   UE MMT:   WFL  SENSATION: Decreased sensation in finger tips in B hands  COGNITION: Overall cognitive status: Within functional limits for tasks assessed  OBSERVATIONS: Bradykinesia   TODAY'S TREATMENT:                      03/17/23 Coordination: engaged in small peg board pattern replication with use of RUE and LUE while focusing on motor control and coordination.  Pt with min drops, however did require increased time for completion due to bradykinesia. UE ROM: pt brought in pulley from home and expressed desire to have exercises that she can complete with pulley sytem.  OT educated on shoulder flexion, abduction, and internal/external rotation.  OT encouraged pt to start slowly and limit "pulling" with pulley but to allow it to facilitate increased ROM without resistance.   Exercises - Seated Shoulder Flexion AAROM with Pulley Behind  -  3 x weekly - 10 reps - Seated Shoulder Abduction AAROM with Pulley Behind  - 3 x weekly - 10 reps - Standing External Rotation in Abduction AAROM with Pulley   - 3 x weekly - 10 reps - Standing Shoulder Internal Rotation AAROM with Pulley  - 3 x weekly - 10 reps   03/11/23 Back stretches: OT reviewed back stretches in standing and sitting with focus on alleviating low back pain.  Encouraged pt to sustain stretch as long as no increase in pain, recommending 10-15 seconds.  Pt preferring modified cat/cow stretch while standing with UE supported on counter top.  No c/o dizziness or lightheadedness today. Exercises - Seated Flexion Stretch  - 2-3 x daily - 3-5 reps -  15sec hold - Modified Cat Cow on Counter  - 2-3 x daily - 3-5 reps - 10 sec hold Large amplitude: engaged in modified LSVT big exercises with focus on forward reach with elbow extension and hands open with fingers extended and trunk rotation incorporating reaching across body with forearm supinated.  Noted difficulty and decreased ROM with supination. Therefore transitioned to card flipping with cues for forearm rotation/supination for massed practice.  Pt demonstrating increased difficulty with R side/shoulder compared to L.    03/03/23 PNF pattern reaching in standing with focus on large amplitude and balance during reaching down towards low surface and across midline to upper surface.  OT reiterating carryover of LSVT exercises to more functional tasks. Coordination: Attempted ball rotation in hand with rotating 2 balls clockwise and counter clockwise, attempted different sizes of small balls with pt unable to complete despite smaller circumference.  Transitioned to picking up various small objects/coins with focus on picking up from table top and not sliding.  Educated on functional carryover and use of large amplitude opening of hands when picking up and releasing of items.    PATIENT EDUCATION: Education details: ongoing condition specific education, provided with handouts on community PD programs Person educated: Patient Education method: Explanation and Handouts Education comprehension: verbalized understanding and needs further education  HOME EXERCISE PROGRAM: Access Code: [email or web address] URL: [email or web address] Date: 12/24/2022 Prepared by: Fannie Knee   GOALS: Goals reviewed with patient? Yes  SHORT TERM GOALS: Target date: 01/02/23  Pt will be independent with PD specific HEP. Baseline: needs plan Goal status: 01/02/23: MET-she states following along with LSVT exercises online at home.  2.  Pt will verbalize understanding of adapted strategies to maximize  safety and I with ADLs/ IADLs. Baseline:  Goal status: 01/02/23: Progressing  3.  Pt will demonstrate improved fine motor coordination for ADLs as evidenced by decreasing 9 hole peg test score for RUE by 5 secs Baseline: 9 Hole Peg test: Right: 1:10.91 sec; Left: 57.75 sec Goal status: Met - R: 64.38 sec and L: 49.53 sec 01/14/23  4.  Pt will demonstrate improved grip strength as needed to mange clothing fasteners and open various household containers. Baseline: grip strength 15# Rt, 23# Lt Goal status: 01/02/23: Progressing    LONG TERM GOALS: Target date: 03/27/23  Pt will write a short paragraph with 100% legibility and no significant decrease in letter size Baseline: 90% legibility with mild-mod decrease in size Goal status: IN PROGRESS  2.  Pt will demonstrate improved ease with feeding as evidenced by decreasing PPT#2 (self feeding) by 3 secs Baseline: 13.57 sec Goal status: IN PROGRESS  3.  Pt will demonstrate improved ease with UB dressing tasks (doffing shirt and donning/doffing jacket) with use of adaptive strategies and  carryover of large amplitude movements.  Baseline:  Goal status: IN PROGRESS  ASSESSMENT:  CLINICAL IMPRESSION: Pt continues to be quite verbose and reports multiple concerns with advancements of PD and concerns about continued advancements.  Pt benefits from therapeutic listening and therapeutic use of self during sessions to allow pt to process areas of concern.  Pt demonstrating carryover of large amplitude hand exercises prior to coordination tasks and pleased with addition of exercises to incorporate pulley.    PLAN:  OT FREQUENCY: 1-2x/week  OT DURATION: 8 weeks  PLANNED INTERVENTIONS: self care/ADL training, therapeutic exercise, therapeutic activity, neuromuscular re-education, manual therapy, passive range of motion, balance training, functional mobility training, ultrasound, compression bandaging, moist heat, cryotherapy, contrast bath,  patient/family education, psychosocial skills training, energy conservation, coping strategies training, and DME and/or AE instructions  CONSULTED AND AGREED WITH PLAN OF CARE: Patient  PLAN FOR NEXT SESSION:  Continue working on functional skills and improving UE motion and functional ADL ability.  Plan to further address UB dressing (and jacket on/off) as pt reports continued difficulty with this. Handwriting, coordination, and large amplitude HEP.  F/u regarding back pain.   , , OTR/L 03/17/2023, 1:21 PM

## 2023-03-24 ENCOUNTER — Ambulatory Visit: Payer: PPO

## 2023-03-24 ENCOUNTER — Ambulatory Visit: Payer: PPO | Admitting: Occupational Therapy

## 2023-03-24 DIAGNOSIS — M6281 Muscle weakness (generalized): Secondary | ICD-10-CM

## 2023-03-24 DIAGNOSIS — R29818 Other symptoms and signs involving the nervous system: Secondary | ICD-10-CM

## 2023-03-24 DIAGNOSIS — R1310 Dysphagia, unspecified: Secondary | ICD-10-CM

## 2023-03-24 DIAGNOSIS — R278 Other lack of coordination: Secondary | ICD-10-CM

## 2023-03-24 DIAGNOSIS — R471 Dysarthria and anarthria: Secondary | ICD-10-CM

## 2023-03-24 NOTE — Therapy (Signed)
OUTPATIENT SPEECH LANGUAGE PATHOLOGY [facility]'S EVALUATION   Patient Name: Ashley Neal MRN: [id number] DOB:01-09-1946, 77 y.o., female Today's Date: 03/24/2023  PCP: Abbey Chatters NP REFERRING PROVIDER: Marzetta Board, MD  END OF SESSION:  End of Session - 03/24/23 1533     Visit Number 1    Number of Visits 17    Date for SLP Re-Evaluation 05/22/23    SLP Start Time 1403    SLP Stop Time  1445    SLP Time Calculation (min) 42 min    Activity Tolerance Patient tolerated treatment well             Past Medical History:  Diagnosis Date   Anxiety    Basal cell carcinoma    Depression    Hypertension    Neuromuscular disorder (HCC)    Osteoarthritis    Osteopenia    Parkinson disease    Pneumonia    Tremors of nervous system    legs   uterine ca 10/08/2021   also endometrial cancer   Past Surgical History:  Procedure Laterality Date   CARPAL TUNNEL RELEASE Bilateral 1996/1997   CYSTOSCOPY N/A 10/08/2021   Procedure: CYSTOSCOPY;  Surgeon: Carver Fila, MD;  Location: [facility];  Service: Gynecology;  Laterality: N/A;   ROBOTIC ASSISTED TOTAL HYSTERECTOMY WITH BILATERAL SALPINGO OOPHERECTOMY N/A 10/08/2021   Procedure: XI ROBOTIC ASSISTED TOTAL HYSTERECTOMY WITH BILATERAL SALPINGO OOPHORECTOMY;SENTINEL LYMPH NODE INJECTION;  Surgeon: Carver Fila, MD;  Location: [facility];  Service: Gynecology;  Laterality: N/A;   SQUAMOUS CELL CARCINOMA EXCISION Left 2017   Left jawline   TUBAL LIGATION  1980's   Patient Active Problem List   Diagnosis Date Noted   Endometrial cancer (HCC) 10/16/2021   Complex atypical endometrial hyperplasia 09/27/2021   Primary osteoarthritis of both hands 05/14/2020   Primary osteoarthritis of both feet 05/14/2020   Age-related osteoporosis without current pathological fracture 05/14/2020   Prediabetes 05/14/2020   Essential hypertension 05/14/2020   History of hyperlipidemia 05/14/2020   History of squamous cell  carcinoma 05/14/2020   Parkinsonism 05/14/2020    ONSET DATE: approx 2-3 months ago  REFERRING DIAG: G20.A1 (ICD-10-CM) - Parkinson's disease without dyskinesia, without mention of fluctuations   THERAPY DIAG:  Dysarthria and anarthria  Dysphagia, unspecified type  Rationale for Evaluation and Treatment: Rehabilitation  SUBJECTIVE:   SUBJECTIVE STATEMENT: "I used to not have any difficulty with my voice." Pt accompanied by: self  PERTINENT HISTORY: HTN, depression, osteoarthritis, chronic back pain   PAIN:  Are you having pain?   Yes: NPRS scale: 6/10  Pain location: lower back pain Pain description: sore, throbbing at times Aggravating factors: sitting Relieving factors: medication  FALLS: Has patient fallen in last 6 months?  See PT evaluation for details  LIVING ENVIRONMENT: Lives with: lives alone Lives in: House/apartment  PLOF:  Level of assistance: Independent with ADLs, Independent with IADLs Employment: Retired  PATIENT GOALS: Voice improvement  OBJECTIVE:   COGNITION: Overall cognitive status: Within functional limits for tasks assessed   MOTOR SPEECH: Overall motor speech: impaired Level of impairment: Firefighter Respiration: thoracic breathing Phonation: low vocal intensity and mildly harsh at times Resonance: WFL Articulation: Appears intact Intelligibility: Intelligible Motor planning: Appears intact Effective technique: increased vocal intensity and focus on intent and "speaking to a group"  ORAL MOTOR EXAMINATION Overall status: Impaired: Lingual: Right (Strength) Facial: Bilateral (ROM) Comments: hypomimia (mild)  OBJECTIVE VOICE ASSESSMENT: Sustained "ah" maximum phonation time: 11 seconds Sustained "ah" loudness average:  84 dB Oral reading (passage) loudness average: 69 dB Oral reading loudness range: 57-78 dB Conversational loudness average: 72 dB Conversational loudness range: 55-80 dB Voice quality: harsh and  low vocal intensity Stimulability trials: Given SLP modeling and rare min cues, loudness average increased to 73dB (range of 60 to 80dB) at conversation level.  Comments: SLP highlighted pt's strength of voice at ends of utterances in that she did not exhibit decr'd volume ("trailing off") when thinking about intent and speaking out, what pt referred to as "presenting to a group". Additionally, her voice quality improved to a more WNL quality.  Completed audio recording of patients baseline voice without cueing from SLP: Yes  Please see pt's SLE from 03/03/23 for swallowing.  PATIENT REPORTED OUTCOME MEASURES (PROM): Communication Effectiveness Survey: to be completed in first two sessions  TODAY'S TREATMENT:                                                                                                                                         DATE:   SO = SPEAKOUT!  03/24/23: SLP shaped pt's loud /a/, and provided education about rationale for ST, SpeakOut (SO) program components and pt must commit to 24 days of the program, and role of SLP in the rehab process  PATIENT EDUCATION: Education details: See "today's treatment" Person educated: Patient Education method: Explanation, Demonstration, Verbal cues, and Handouts Education comprehension: verbalized understanding, returned demonstration, verbal cues required, and needs further education  HOME EXERCISE PROGRAM: 10 loud /a/ BID   GOALS: Goals reviewed with patient? No  SHORT TERM GOALS: Target date: 04/24/23   Pt will demo rotary chewing pattern with POs during session x2 Baseline: Goal status: INITIAL   2.  Pt will complete vocal warm up tasks (M-words, ah, glides, counting) with rare min A in 2 sessions Baseline:  Goal status: INITIAL   3.  Pt will improve speech volume in 5 minutes simple conversation to average 73 dB in 3 sessions Baseline:  Goal status: INITIAL   4.  Pt will demonstrate completion of SO home exercises  as prescribed 90% compliance in first 2 weeks Baseline:  Goal status: INITIAL  5.  Pt will complete effortful swallow with independence in 2 sessions Baseline:  Goal status: INITIAL     LONG TERM GOALS: Target date: 1011/24   1.Pt will complete vocal warm up tasks (M-words, ah, glides, counting) with modified independence in 3 sessions Baseline:  Goal status: INITIAL   2.  Pt will improve speech volume in 10 minutes simple-mod complex conversation to low 70s dB in 3 sessions Baseline:  Goal status: INITIAL   3.  In last 1-2 sessions, PROM scores will improve compared to initial administration Baseline:  Goal status: INITIAL   4.  Pt will tell SLP what HEP to perform in order to maintain WNL volume speech over time Baseline:  Goal status: INITIAL  ASSESSMENT:  CLINICAL IMPRESSION: "Azia" is a 77 y.o. F who was seen today for assessment of speech/voice in light of PD. She had BSE 03/03/23 that did not yield observable overt s/sx of pharyngeal deficits, and SLP recommended effortful swallow exercise x20/day x8 weeks then x3/week after that. SLP will cont to monitor swallow function and accuracy of effortful swallow exercise during ST sessions, and MBS may be necessary during this therapy course. With her speech, pt has noted that "The volume gets really low and the ends of my sentences are especially low at times." Pt has agreed to perform SO therapy with SLP for 24 days and a ST therapy course of 17 possible visits.   OBJECTIVE IMPAIRMENTS: Objective impairments include dysarthria, voice disorder, and dysphagia. These impairments are limiting patient from household responsibilities, ADLs/IADLs, effectively communicating at home and in community, and safety when swallowing.Factors affecting potential to achieve goals and functional outcome are  none noted today . Patient will benefit from skilled SLP services to address above impairments and improve overall function.  REHAB  POTENTIAL: Good  PLAN:  SLP FREQUENCY: 2x/week  SLP DURATION: 8 weeks  PLANNED INTERVENTIONS: Aspiration precaution training, Pharyngeal strengthening exercises, Diet toleration management , Environmental controls, Trials of upgraded texture/liquids, Functional tasks, SLP instruction and feedback, Compensatory strategies, Patient/family education, and possible MBS    ,, CCC-SLP 03/24/2023, 3:33 PM

## 2023-03-24 NOTE — Therapy (Signed)
OUTPATIENT OCCUPATIONAL THERAPY [facility]IN[facility]'S  TREATMENT NOTE & Discharge  Patient Name: Ashley Neal MRN: [id number] DOB:11-Oct-1945, 77 y.o., female Today's Date: 03/24/2023  PCP: [name], NP REFERRING PROVIDER: [name], NP  OCCUPATIONAL THERAPY DISCHARGE SUMMARY  Visits from Start of Care: 16  Current functional level related to goals / functional outcomes: Pt continues to require assistance with grooming hair and increased time with dressing, however has demonstrated improvements with ease with dressing both with techniques and modifying wardrobe to larger, looser clothing.  Pt demonstrating improvements in coordination with self-feeding, handwriting, and 9 hole peg test.   Remaining deficits: Freezing with dressing, particularly undressing   Education / Equipment: HEP for large amplitude exercises, coordination HEP, techniques for increased ease with dressing.   Patient agrees to discharge. Patient goals were met. Patient is being discharged due to meeting the stated rehab goals..     END OF SESSION:  OT End of Session - 03/24/23 1324     Visit Number 16    Number of Visits 16    Date for OT Re-Evaluation 03/27/23    Authorization Type [employer]    OT Start Time 1320    OT Stop Time 1400    OT Time Calculation (min) 40 min    Activity Tolerance Patient tolerated treatment well;Patient limited by pain;Patient limited by fatigue    Behavior During Therapy [facility] for tasks assessed/performed;Restless                       Past Medical History:  Diagnosis Date   Anxiety    Basal cell carcinoma    Depression    Hypertension    Neuromuscular disorder (HCC)    Osteoarthritis    Osteopenia    Parkinson disease    Pneumonia    Tremors of nervous system    legs   uterine ca 10/08/2021   also endometrial cancer   Past Surgical History:  Procedure Laterality Date   CARPAL TUNNEL RELEASE Bilateral 1996/1997    CYSTOSCOPY N/A 10/08/2021   Procedure: CYSTOSCOPY;  Surgeon: Carver Fila, MD;  Location: [facility];  Service: Gynecology;  Laterality: N/A;   ROBOTIC ASSISTED TOTAL HYSTERECTOMY WITH BILATERAL SALPINGO OOPHERECTOMY N/A 10/08/2021   Procedure: XI ROBOTIC ASSISTED TOTAL HYSTERECTOMY WITH BILATERAL SALPINGO OOPHORECTOMY;SENTINEL LYMPH NODE INJECTION;  Surgeon: Carver Fila, MD;  Location: [facility];  Service: Gynecology;  Laterality: N/A;   SQUAMOUS CELL CARCINOMA EXCISION Left 2017   Left jawline   TUBAL LIGATION  1980's   Patient Active Problem List   Diagnosis Date Noted   Endometrial cancer (HCC) 10/16/2021   Complex atypical endometrial hyperplasia 09/27/2021   Primary osteoarthritis of both hands 05/14/2020   Primary osteoarthritis of both feet 05/14/2020   Age-related osteoporosis without current pathological fracture 05/14/2020   Prediabetes 05/14/2020   Essential hypertension 05/14/2020   History of hyperlipidemia 05/14/2020   History of squamous cell carcinoma 05/14/2020   Parkinsonism 05/14/2020    ONSET DATE: referral date 11/14/22  REFERRING DIAG: G20.A1 (ICD-10-CM) - Parkinson's disease, unspecified whether dyskinesia present, unspecified whether manifestations fluctuate M54.16 (ICD-10-CM) - Lumbar radiculopathy  THERAPY DIAG:  Other lack of coordination  Muscle weakness (generalized)  Other symptoms and signs involving the nervous system  Rationale for Evaluation and Treatment: Rehabilitation  PERTINENT HISTORY: PD, HTN, osteoarthritis, chronic back pain, depression "I hate Parkinson's"  Pt reports that her mother's sister and pt's brother both had PD.  Pt reports past 2 years  have been awful; with onset of back pain, uterine cancer s/p hysterectomy and lymph node removal, and persistent back ache.  Pt reports that she has difficulty with managing buttons, feeding herself (especially cutting).  Pt reports she has been going to a balance class at the senior center.     PRECAUTIONS: Fall ; WEIGHT BEARING RESTRICTIONS: No   SUBJECTIVE:   SUBJECTIVE STATEMENT: Pt reports that she is on her second week of new medication, with reports of initial improvements but feeling worse this week.     PAIN:  Are you having pain?   Yes: NPRS scale: 6/10 (chronic pain)  Pain location: pain in pelvis and lower back pain, R shoulder Pain description: radiating Aggravating factors: sitting Relieving factors: medication, xanax worked (but they are not prescribing it to her anymore)  FALLS: Has patient fallen in last 6 months? Yes. 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   PATIENT GOALS: to be able to maintain typical living skills to remain in home   OBJECTIVE: (All objective assessments below are from initial evaluation on: 12/04/22 unless otherwise specified.)   HAND DOMINANCE: Right  ADLs: Transfers/ambulation related to ADLs: Independent Eating: typically will eat hand held items, difficulty with cutting foods Grooming: difficulty with styling hair UB Dressing: buttons are challenging, fastens bra in front LB Dressing: typically will lean up against something and complete in standing Toileting: Mod I, grab bar next to toilet Bathing: Mod I, has shower chair but doesn't use it Tub Shower transfers: had tub/shower remodeled to walk in shower Equipment: Shower seat with back, Grab bars, Walk in shower, Reacher, and Long handled shoe horn  IADLs: Shopping: order online, son will pick up for her Light housekeeping: reports that she can't clean as well as  she used to Meal Prep: makes simple meals Community mobility: still driving Medication management: easy off caps, uses phone as reminder to take Handwriting: 90% legible, Increased time, Moderate micrographia, and PPT #1: 14.59 ([place])  MOBILITY STATUS: Hx of falls  POSTURE COMMENTS:  rounded shoulders  ACTIVITY TOLERANCE: Activity tolerance: WFL for tasks assessed on eval  FUNCTIONAL OUTCOME MEASURES:  12/17/22: Grip strength measures: Rt: 15#; Lt: 23# 03/24/23: Grip strength: R: 15#, L: 22# Eval: Fastening/unfastening 3 buttons: 1:07.94 Physical performance test: PPT#2 (simulated eating) 13.57 sec & PPT#4 (donning/doffing jacket): 13.37sec (12/17/22)  COORDINATION: 9 Hole Peg test: Right: 1:10.91 sec; Left: 57.75 sec 01/14/23 R: 64.38 sec and L: 49.53 sec 03/24/23: R: 55.38 sec  Box and Blocks:  Right 48 blocks, Left 46 blocks Tremors: Resting and Right  UE ROM:   01/07/23: Rt sh flex: 116*, Abd: 82*, ER: 75*, IR: 46*   12/24/22: Rt Sh:   Flexion:  100*, Abd: 105*, ER: 46* tender, IR 44*   UE MMT:   WFL  SENSATION: Decreased sensation in finger tips in B hands  COGNITION: Overall cognitive status: Within functional limits for tasks assessed  OBSERVATIONS: Bradykinesia   TODAY'S TREATMENT:                      03/24/23 Self-feeding: engaged in PPT #2  completing in 10.0 seconds with significant improved coordination and speed.  9 hole peg test: R: 55.38 sec, L: 52.16 sec Handwriting: Pt writing 2-3 sentence passage, completing with improved legibility and sizing. UB dressing: pt reports improvements, modifying wardrobe to wear larger and looser fitting clothing.  Engaged in donning/doffing jacket, completing in  25.91 sec (on in 8, but difficulty doffing).   03/17/23 Coordination: engaged in small peg board pattern replication with use of RUE and LUE while focusing on motor control and coordination.  Pt with min drops, however did require increased time  for completion due to bradykinesia. UE ROM: pt brought in pulley from home and expressed desire to have exercises that she can complete with pulley sytem.  OT educated on shoulder flexion, abduction, and internal/external rotation.  OT encouraged pt to start slowly and limit "pulling" with pulley but to allow it to facilitate increased ROM without resistance.   Exercises - Seated Shoulder Flexion AAROM with Pulley Behind  - 3 x weekly - 10 reps - Seated Shoulder Abduction AAROM with Pulley Behind  - 3 x weekly - 10 reps - Standing External Rotation in Abduction AAROM with Pulley   - 3 x weekly - 10 reps - Standing Shoulder Internal Rotation AAROM with Pulley  - 3 x weekly - 10 reps   03/11/23 Back stretches: OT reviewed back stretches in standing and sitting with focus on alleviating low back pain.  Encouraged pt to sustain stretch as long as no increase in pain, recommending 10-15 seconds.  Pt preferring modified cat/cow stretch while standing with UE supported on counter top.  No c/o dizziness or lightheadedness today. Exercises - Seated Flexion Stretch  - 2-3 x daily - 3-5 reps - 15sec hold - Modified Cat Cow on Counter  - 2-3 x daily - 3-5 reps - 10 sec hold Large amplitude: engaged in modified LSVT big exercises with focus on forward reach with elbow extension and hands open with fingers extended and trunk rotation incorporating reaching across body with forearm supinated.  Noted difficulty and decreased ROM with supination. Therefore transitioned to card flipping with cues for forearm rotation/supination for massed practice.  Pt demonstrating increased difficulty with R side/shoulder compared to L.    PATIENT EDUCATION: Education details: ongoing condition specific education, provided with handouts on community PD programs Person educated: Patient Education method: Explanation and Handouts Education comprehension: verbalized understanding and needs further education  HOME EXERCISE  PROGRAM: Access Code: [email or web address] URL: [email or web address] Date: 12/24/2022 Prepared by: Fannie Knee   GOALS: Goals reviewed with patient? Yes  SHORT TERM GOALS: Target date: 01/02/23  Pt will be independent with PD specific HEP. Baseline: needs plan Goal status: 01/02/23: MET-she states following along with LSVT exercises online at home.  2.  Pt will verbalize understanding of adapted strategies to maximize safety and I with ADLs/ IADLs. Baseline:  Goal status: 01/02/23: Progressing  3.  Pt will demonstrate improved fine motor coordination for ADLs as evidenced by decreasing 9 hole peg test score for RUE by 5 secs Baseline: 9 Hole Peg test: Right: 1:10.91 sec; Left: 57.75 sec Goal status: Met - R: 64.38 sec and L: 49.53 sec 01/14/23  4.  Pt will demonstrate improved grip strength as needed to mange clothing fasteners and open various household containers. Baseline: grip strength 15# Rt, 23# Lt Goal status: 01/02/23: Progressing    LONG TERM GOALS: Target date: 03/27/23  Pt will write a short paragraph with 100% legibility and no significant decrease in letter size  Baseline: 90% legibility with mild-mod decrease in size Goal status: MET - 03/24/23  2.  Pt will demonstrate improved ease with feeding as evidenced by decreasing PPT#2 (self feeding) by 3 secs Baseline: 13.57 sec Goal status: MET - 10.0 sec on 03/24/23  3.  Pt will demonstrate improved ease with UB dressing tasks (doffing shirt and donning/doffing jacket) with use of adaptive strategies and carryover of large amplitude movements.  Baseline:  Goal status: MET - pt modified wardrobe to increase ease with donning/doffing clothing. - 03/24/23  ASSESSMENT:  CLINICAL IMPRESSION: Pt continues to voice multiple concerns with advancements of PD and concerns about continued advancements and physical assistance going forward.  Pt benefits from therapeutic listening and therapeutic use of self during sessions to  allow pt to process areas of concern.  Pt reporting and demonstrating improvements in coordination, handwriting, and aspects of self-care tasks.  Pt understand plan for PD screens and verbalizes understanding of reaching out to MD for referral if needing OT services prior to screen.  PLAN:  OT FREQUENCY: 1-2x/week  OT DURATION: 8 weeks  PLANNED INTERVENTIONS: self care/ADL training, therapeutic exercise, therapeutic activity, neuromuscular re-education, manual therapy, passive range of motion, balance training, functional mobility training, ultrasound, compression bandaging, moist heat, cryotherapy, contrast bath, patient/family education, psychosocial skills training, energy conservation, coping strategies training, and DME and/or AE instructions  CONSULTED AND AGREED WITH PLAN OF CARE: Patient  PLAN FOR NEXT SESSION: PD screen in 6-9 months.   , , OTR/L 03/24/2023, 1:25 PM

## 2023-03-24 NOTE — Patient Instructions (Signed)
   TEN confident, purposeful "ahhhhhhhhhhhhhh" - SPEAK OUT!  Remember - hand on belly to feel the muscles working, full breath, open mouth

## 2023-03-26 ENCOUNTER — Ambulatory Visit: Payer: PPO

## 2023-03-26 DIAGNOSIS — M6281 Muscle weakness (generalized): Secondary | ICD-10-CM | POA: Diagnosis not present

## 2023-03-26 DIAGNOSIS — R1310 Dysphagia, unspecified: Secondary | ICD-10-CM

## 2023-03-26 DIAGNOSIS — R471 Dysarthria and anarthria: Secondary | ICD-10-CM

## 2023-03-26 NOTE — Patient Instructions (Signed)
     [email or web address]  WATCH THE 60-minute video before you come back  Do one Lesson each day - then do it again, or go online and do a video session

## 2023-03-26 NOTE — Therapy (Signed)
OUTPATIENT SPEECH LANGUAGE PATHOLOGY PARKINSON'S TREATMENT   Patient Name: Ashley Neal MRN: [id number] DOB:[birth year], 77 y.o., female Today's Date: 03/26/2023  PCP: Ashley Chatters NP REFERRING PROVIDER: Marzetta Board, MD  END OF SESSION:  End of Session - 03/26/23 1543     Visit Number 2    Number of Visits 17    Date for SLP Re-Evaluation 05/22/23    SLP Start Time 1538    SLP Stop Time  1615    SLP Time Calculation (min) 37 min    Activity Tolerance Patient tolerated treatment well              Past Medical History:  Diagnosis Date   Anxiety    Basal cell carcinoma    Depression    Hypertension    Neuromuscular disorder (HCC)    Osteoarthritis    Osteopenia    Parkinson disease    Pneumonia    Tremors of nervous system    legs   uterine ca 10/08/2021   also endometrial cancer   Past Surgical History:  Procedure Laterality Date   CARPAL TUNNEL RELEASE Bilateral 1996/1997   CYSTOSCOPY N/A 10/08/2021   Procedure: CYSTOSCOPY;  Surgeon: Ashley Fila, MD;  Location: [facility];  Service: Gynecology;  Laterality: N/A;   ROBOTIC ASSISTED TOTAL HYSTERECTOMY WITH BILATERAL SALPINGO OOPHERECTOMY N/A 10/08/2021   Procedure: XI ROBOTIC ASSISTED TOTAL HYSTERECTOMY WITH BILATERAL SALPINGO OOPHORECTOMY;SENTINEL LYMPH NODE INJECTION;  Surgeon: Ashley Fila, MD;  Location: [facility];  Service: Gynecology;  Laterality: N/A;   SQUAMOUS CELL CARCINOMA EXCISION Left 2017   Left jawline   TUBAL LIGATION  1980's   Patient Active Problem List   Diagnosis Date Noted   Endometrial cancer (HCC) 10/16/2021   Complex atypical endometrial hyperplasia 09/27/2021   Primary osteoarthritis of both hands 05/14/2020   Primary osteoarthritis of both feet 05/14/2020   Age-related osteoporosis without current pathological fracture 05/14/2020   Prediabetes 05/14/2020   Essential hypertension 05/14/2020   History of hyperlipidemia 05/14/2020   History of squamous cell  carcinoma 05/14/2020   Parkinsonism 05/14/2020    ONSET DATE: approx 2-3 months ago  REFERRING DIAG: G20.A1 (ICD-10-CM) - Parkinson's disease without dyskinesia, without mention of fluctuations   THERAPY DIAG:  Dysarthria and anarthria  Dysphagia, unspecified type  Rationale for Evaluation and Treatment: Rehabilitation  SUBJECTIVE:   SUBJECTIVE STATEMENT: "I feel so much better today. She did Ashley Neal method on me." Pt accompanied by: self  PERTINENT HISTORY: HTN, depression, osteoarthritis, chronic back pain   PAIN:  Are you having pain?   Yes: NPRS scale: 3/10  Pain location: lower back pain Pain description: sore, throbbing at times Aggravating factors: sitting Relieving factors: medication  FALLS: Has patient fallen in last 6 months?  See PT evaluation for details  LIVING ENVIRONMENT: Lives with: lives alone Lives in: House/apartment  PLOF:  Level of assistance: Independent with ADLs, Independent with IADLs Employment: Retired  PATIENT GOALS: Voice improvement  OBJECTIVE:    PATIENT REPORTED OUTCOME MEASURES (PROM): Communication Effectiveness Survey: to be completed in first two sessions  TODAY'S TREATMENT:  DATE:   SO = SPEAKOUT!  03/26/23: SLP and pt completed Lesson 1 for SO. SLP instructed pt how to complete program, she will choose to do online session for her second session each day.  SLP targeted improved vocal quality and increasing intensity through progressively difficulty speech tasks using Speak Out! program, lesson 1. ST lead pt through exercises with averages this date:  M-words: 83dB after consistent mod A for intent Sustained "ah" 84 dB after consistent mod A for intent and usual mod A faded to occasional min A for proper breath support Reading (phrases) 85 dB - rare min cue for intent Cognitive speech  task 76dB (spelling opposite pairs)   03/24/23: SLP shaped pt's loud /a/, and provided education about rationale for ST, SpeakOut (SO) program components and pt must commit to 24 days of the program, and role of SLP in the rehab process  PATIENT EDUCATION: Education details: See "today's treatment" Person educated: Patient Education method: Explanation, Demonstration, Verbal cues, and Handouts Education comprehension: verbalized understanding, returned demonstration, verbal cues required, and needs further education  HOME EXERCISE PROGRAM: 10 loud /a/ BID   GOALS: Goals reviewed with patient? No  SHORT TERM GOALS: Target date: 04/24/23   Pt will demo rotary chewing pattern with POs during session x2 Baseline: Goal status: INITIAL   2.  Pt will complete vocal warm up tasks (M-words, ah, glides, counting) with rare min A in 2 sessions Baseline:  Goal status: INITIAL   3.  Pt will improve speech volume in 5 minutes simple conversation to average 73 dB in 3 sessions Baseline:  Goal status: INITIAL   4.  Pt will demonstrate completion of SO home exercises as prescribed 90% compliance in first 2 weeks Baseline:  Goal status: INITIAL  5.  Pt will complete effortful swallow with independence in 2 sessions Baseline:  Goal status: INITIAL     LONG TERM GOALS: Target date: 1011/24   1.Pt will complete vocal warm up tasks (M-words, ah, glides, counting) with modified independence in 3 sessions Baseline:  Goal status: INITIAL   2.  Pt will improve speech volume in 10 minutes simple-mod complex conversation to low 70s dB in 3 sessions Baseline:  Goal status: INITIAL   3.  In last 1-2 sessions, PROM scores will improve compared to initial administration Baseline:  Goal status: INITIAL   4.  Pt will tell SLP what HEP to perform in order to maintain WNL volume speech over time Baseline:  Goal status: INITIAL  ASSESSMENT:  CLINICAL IMPRESSION: "Ashley Neal" is a 77 y.o. F who was  seen today for treatment of speech/voice in light of PD. She had BSE 03/03/23 that did not yield observable overt s/sx of pharyngeal deficits, and SLP recommended effortful swallow exercise x20/day x8 weeks then x3/week after that. SLP will cont to monitor swallow function and accuracy of effortful swallow exercise during ST sessions, and MBS may be necessary during this therapy course. With her speech, pt has noted that "The volume gets really low and the ends of my sentences are especially low at times." Pt has agreed to perform SO therapy with SLP for 24 days and a ST therapy course of 17 possible visits.   OBJECTIVE IMPAIRMENTS: Objective impairments include dysarthria, voice disorder, and dysphagia. These impairments are limiting patient from household responsibilities, ADLs/IADLs, effectively communicating at home and in community, and safety when swallowing.Factors affecting potential to achieve goals and functional outcome are  none noted today . Patient will benefit from skilled SLP  services to address above impairments and improve overall function.  REHAB POTENTIAL: Good  PLAN:  SLP FREQUENCY: 2x/week  SLP DURATION: 8 weeks  PLANNED INTERVENTIONS: Aspiration precaution training, Pharyngeal strengthening exercises, Diet toleration management , Environmental controls, Trials of upgraded texture/liquids, Functional tasks, SLP instruction and feedback, Compensatory strategies, Patient/family education, and possible MBS    ,, CCC-SLP 03/26/2023, 3:44 PM

## 2023-03-30 ENCOUNTER — Ambulatory Visit: Payer: PPO

## 2023-03-30 DIAGNOSIS — R1310 Dysphagia, unspecified: Secondary | ICD-10-CM

## 2023-03-30 DIAGNOSIS — R471 Dysarthria and anarthria: Secondary | ICD-10-CM

## 2023-03-30 DIAGNOSIS — M6281 Muscle weakness (generalized): Secondary | ICD-10-CM | POA: Diagnosis not present

## 2023-03-30 NOTE — Therapy (Signed)
OUTPATIENT SPEECH LANGUAGE PATHOLOGY [facility]   Patient Name: Ashley Neal MRN: [id number] DOB:[birth year], 77 y.o., female Today's Date: 03/30/2023  PCP: Abbey Chatters NP REFERRING PROVIDER: Marzetta Board, MD  END OF SESSION:     Past Medical History:  Diagnosis Date   Anxiety    Basal cell carcinoma    Depression    Hypertension    Neuromuscular disorder (HCC)    Osteoarthritis    Osteopenia    Parkinson disease    Pneumonia    Tremors of nervous system    legs   uterine ca 10/08/2021   also endometrial cancer   Past Surgical History:  Procedure Laterality Date   CARPAL TUNNEL RELEASE Bilateral 1996/1997   CYSTOSCOPY N/A 10/08/2021   Procedure: CYSTOSCOPY;  Surgeon: Carver Fila, MD;  Location: [facility];  Service: Gynecology;  Laterality: N/A;   ROBOTIC ASSISTED TOTAL HYSTERECTOMY WITH BILATERAL SALPINGO OOPHERECTOMY N/A 10/08/2021   Procedure: XI ROBOTIC ASSISTED TOTAL HYSTERECTOMY WITH BILATERAL SALPINGO OOPHORECTOMY;SENTINEL LYMPH NODE INJECTION;  Surgeon: Carver Fila, MD;  Location: [facility];  Service: Gynecology;  Laterality: N/A;   SQUAMOUS CELL CARCINOMA EXCISION Left 2017   Left jawline   TUBAL LIGATION  1980's   Patient Active Problem List   Diagnosis Date Noted   Endometrial cancer (HCC) 10/16/2021   Complex atypical endometrial hyperplasia 09/27/2021   Primary osteoarthritis of both hands 05/14/2020   Primary osteoarthritis of both feet 05/14/2020   Age-related osteoporosis without current pathological fracture 05/14/2020   Prediabetes 05/14/2020   Essential hypertension 05/14/2020   History of hyperlipidemia 05/14/2020   History of squamous cell carcinoma 05/14/2020   Parkinsonism 05/14/2020    ONSET DATE: approx 2-3 months ago  REFERRING DIAG: G20.A1 (ICD-10-CM) - Parkinson's disease without dyskinesia, without mention of fluctuations   THERAPY DIAG:  Dysarthria and anarthria  Dysphagia, unspecified  type  Rationale for Evaluation and Treatment: Rehabilitation  SUBJECTIVE:   SUBJECTIVE STATEMENT: "I feel so much better today. She did Hendrickson method on me." Pt accompanied by: self  PERTINENT HISTORY: HTN, depression, osteoarthritis, chronic back pain   PAIN:  Are you having pain?   Yes: NPRS scale: 3/10  Pain location: lower back pain Pain description: sore, throbbing at times Aggravating factors: sitting Relieving factors: medication  FALLS: Has patient fallen in last 6 months?  See PT evaluation for details  LIVING ENVIRONMENT: Lives with: lives alone Lives in: House/apartment  PLOF:  Level of assistance: Independent with ADLs, Independent with IADLs Employment: Retired  PATIENT GOALS: Voice improvement  OBJECTIVE:    PATIENT REPORTED OUTCOME MEASURES (PROM): Communication Effectiveness Survey: provided 03/30/23  and pt scored herself 21/32 (lower scores indicate less effective communication). "1" ("not effective") with speaking to a friend when upset, and "4" ("very effective") having a conversation with family at home, and being part of a conversation in a noisy situation.  TODAY'S TREATMENT:  DATE:   SO = SPEAKOUT!  03/30/23: SLP provided pt with CES (see above for result). SLP used SpeakOut! Program to focus on pt's intent and purpose while speaking in order to improve verbal communication. SLP lead pt through exercises with averages this date:  M-words: 85dB after model/simultaneous production to maximize intent Sustained "ah" 86 dB after consistent mod A for intent, and usual mod A faded to rare min A for proper breath support Reading (phrases) 85 dB independently Cognitive speech task 73dB Pt to cont to work on SO on her own. Pt without overt s/sx dysphagia with 7 sips water today.  03/26/23: SLP and pt completed Lesson 1  for SO. SLP instructed pt how to complete program, she will choose to do online session for her second session each day.  SLP targeted improved vocal quality and increasing intensity through progressively difficulty speech tasks using Speak Out! program, lesson 1. ST lead pt through exercises with averages this date:  M-words: 83dB after consistent mod A for intent Sustained "ah" 84 dB after consistent mod A for intent and usual mod A faded to occasional min A for proper breath support Reading (phrases) 85 dB - rare min cue for intent Cognitive speech task 76dB (spelling opposite pairs)   03/24/23: SLP shaped pt's loud /a/, and provided education about rationale for ST, SpeakOut (SO) program components and pt must commit to 24 days of the program, and role of SLP in the rehab process  PATIENT EDUCATION: Education details: See "today's treatment" Person educated: Patient Education method: Explanation, Demonstration, Verbal cues, and Handouts Education comprehension: verbalized understanding, returned demonstration, verbal cues required, and needs further education  HOME EXERCISE PROGRAM: 10 loud /a/ BID   GOALS: Goals reviewed with patient? No  SHORT TERM GOALS: Target date: 04/24/23   Pt will demo rotary chewing pattern with POs during session x2 Baseline: Goal status: INITIAL   2.  Pt will complete vocal warm up tasks (M-words, ah, glides, counting) with rare min A in 2 sessions Baseline:  Goal status: INITIAL   3.  Pt will improve speech volume in 5 minutes simple conversation to average 73 dB in 3 sessions Baseline:  Goal status: INITIAL   4.  Pt will demonstrate completion of SO home exercises as prescribed 90% compliance in first 2 weeks Baseline:  Goal status: INITIAL  5.  Pt will complete effortful swallow with independence in 2 sessions Baseline:  Goal status: INITIAL     LONG TERM GOALS: Target date: 1011/24   1.Pt will complete vocal warm up tasks (M-words, ah,  glides, counting) with modified independence in 3 sessions Baseline:  Goal status: INITIAL   2.  Pt will improve speech volume in 10 minutes simple-mod complex conversation to low 70s dB in 3 sessions Baseline:  Goal status: INITIAL   3.  In last 1-2 sessions, PROM scores will improve compared to initial administration Baseline:  Goal status: INITIAL   4.  Pt will tell SLP what HEP to perform in order to maintain WNL volume speech over time Baseline:  Goal status: INITIAL  ASSESSMENT:  CLINICAL IMPRESSION: "Dorit" is a 77 y.o. F who was seen today for treatment of speech/voice in light of PD. She had BSE 03/03/23 that did not yield observable overt s/sx of pharyngeal deficits, and SLP recommended effortful swallow exercise x20/day x8 weeks then x3/week after that. SLP will cont to monitor swallow function and accuracy of effortful swallow exercise during ST sessions, and MBS may be necessary  during this therapy course. With her speech, pt has noted that "The volume gets really low and the ends of my sentences are especially low at times." Pt has agreed to perform SO therapy with SLP for 24 days and a ST therapy course of 17 possible visits.   OBJECTIVE IMPAIRMENTS: Objective impairments include dysarthria, voice disorder, and dysphagia. These impairments are limiting patient from household responsibilities, ADLs/IADLs, effectively communicating at home and in community, and safety when swallowing.Factors affecting potential to achieve goals and functional outcome are  none noted today . Patient will benefit from skilled SLP services to address above impairments and improve overall function.  REHAB POTENTIAL: Good  PLAN:  SLP FREQUENCY: 2x/week  SLP DURATION: 8 weeks  PLANNED INTERVENTIONS: Aspiration precaution training, Pharyngeal strengthening exercises, Diet toleration management , Environmental controls, Trials of upgraded texture/liquids, Functional tasks, SLP instruction and  feedback, Compensatory strategies, Patient/family education, and possible MBS    Valeska Haislip, CCC-SLP 03/30/2023, 11:55 AM

## 2023-04-02 ENCOUNTER — Ambulatory Visit: Payer: PPO

## 2023-04-02 DIAGNOSIS — M6281 Muscle weakness (generalized): Secondary | ICD-10-CM | POA: Diagnosis not present

## 2023-04-02 DIAGNOSIS — R1310 Dysphagia, unspecified: Secondary | ICD-10-CM

## 2023-04-02 DIAGNOSIS — R471 Dysarthria and anarthria: Secondary | ICD-10-CM

## 2023-04-02 NOTE — Therapy (Signed)
OUTPATIENT SPEECH LANGUAGE PATHOLOGY [facility]   Patient Name: Ashley Neal MRN: [id number] DOB:May 01, 1946, 77 y.o., female Today's Date: 04/02/2023  PCP: Abbey Chatters NP REFERRING PROVIDER: Marzetta Board, MD  END OF SESSION:  End of Session - 04/02/23 1459     Visit Number 4    Number of Visits 17    Date for SLP Re-Evaluation 05/22/23    SLP Start Time 1450    SLP Stop Time  1530    SLP Time Calculation (min) 40 min    Activity Tolerance Patient tolerated treatment well               Past Medical History:  Diagnosis Date   Anxiety    Basal cell carcinoma    Depression    Hypertension    Neuromuscular disorder (HCC)    Osteoarthritis    Osteopenia    Parkinson disease    Pneumonia    Tremors of nervous system    legs   uterine ca 10/08/2021   also endometrial cancer   Past Surgical History:  Procedure Laterality Date   CARPAL TUNNEL RELEASE Bilateral 1996/1997   CYSTOSCOPY N/A 10/08/2021   Procedure: CYSTOSCOPY;  Surgeon: Carver Fila, MD;  Location: [facility];  Service: Gynecology;  Laterality: N/A;   ROBOTIC ASSISTED TOTAL HYSTERECTOMY WITH BILATERAL SALPINGO OOPHERECTOMY N/A 10/08/2021   Procedure: XI ROBOTIC ASSISTED TOTAL HYSTERECTOMY WITH BILATERAL SALPINGO OOPHORECTOMY;SENTINEL LYMPH NODE INJECTION;  Surgeon: Carver Fila, MD;  Location: [facility];  Service: Gynecology;  Laterality: N/A;   SQUAMOUS CELL CARCINOMA EXCISION Left 2017   Left jawline   TUBAL LIGATION  1980's   Patient Active Problem List   Diagnosis Date Noted   Endometrial cancer (HCC) 10/16/2021   Complex atypical endometrial hyperplasia 09/27/2021   Primary osteoarthritis of both hands 05/14/2020   Primary osteoarthritis of both feet 05/14/2020   Age-related osteoporosis without current pathological fracture 05/14/2020   Prediabetes 05/14/2020   Essential hypertension 05/14/2020   History of hyperlipidemia 05/14/2020   History of squamous cell  carcinoma 05/14/2020   Parkinsonism 05/14/2020    ONSET DATE: approx 2-3 months ago  REFERRING DIAG: G20.A1 (ICD-10-CM) - Parkinson's disease without dyskinesia, without mention of fluctuations   THERAPY DIAG:  Dysarthria and anarthria  Dysphagia, unspecified type  Rationale for Evaluation and Treatment: Rehabilitation  SUBJECTIVE:   SUBJECTIVE STATEMENT: Pt has been doing effortful swallow each day with meds, 20-25 reps/day. Pt accompanied by: self  PERTINENT HISTORY: HTN, depression, osteoarthritis, chronic back pain   PAIN:  Are you having pain?   Yes: NPRS scale: 3/10  Pain location: lower back pain Pain description: sore, throbbing at times Aggravating factors: sitting Relieving factors: medication  FALLS: Has patient fallen in last 6 months?  See PT evaluation for details  LIVING ENVIRONMENT: Lives with: lives alone Lives in: House/apartment  PLOF:  Level of assistance: Independent with ADLs, Independent with IADLs Employment: Retired  PATIENT GOALS: Voice improvement  OBJECTIVE:    PATIENT REPORTED OUTCOME MEASURES (PROM): Communication Effectiveness Survey: provided 03/30/23  and pt scored herself 21/32 (lower scores indicate less effective communication). "1" ("not effective") with speaking to a friend when upset, and "4" ("very effective") having a conversation with family at home, and being part of a conversation in a noisy situation.  TODAY'S TREATMENT:  DATE:   SO = SPEAKOUT!  04/02/23: Pt has been using SO each day, but lost the link for the second lesson each day online, so only performed once/day. SLP reviewed need for intentional and purposeful speech with exercises. Told pt to repeat the lessons she rec'd last session until next session and use the link provided to access online SO lessons once/day as well.  M-words:  85dB after SLP model/simultaneous production to maximize intent (initial - 82dB) Sustained "ah" 83 dB initially, and with model and SLP min cues pt incr'd loudness to 85dB. Glides pt averaged 83dB with softer production at end of glide downward. Counting - average 88dB with initial model  Cognitive (alphabet) - average 87dB, and word with each letter of alphabet average 84 dB with rare min A for intent   03/30/23: SLP provided pt with CES (see above for result). SLP used SpeakOut! Program to focus on pt's intent and purpose while speaking in order to improve verbal communication. SLP lead pt through exercises with averages this date:  M-words: 85dB after model/simultaneous production to maximize intent Sustained "ah" 86 dB after consistent mod A for intent, and usual mod A faded to rare min A for proper breath support Reading (phrases) 85 dB independently Cognitive speech task 73dB Pt to cont to work on SO on her own. Pt without overt s/sx dysphagia with 7 sips water today.  03/26/23: SLP and pt completed Lesson 1 for SO. SLP instructed pt how to complete program, she will choose to do online session for her second session each day.  SLP targeted improved vocal quality and increasing intensity through progressively difficulty speech tasks using Speak Out! program, lesson 1. ST lead pt through exercises with averages this date:  M-words: 83dB after consistent mod A for intent Sustained "ah" 84 dB after consistent mod A for intent and usual mod A faded to occasional min A for proper breath support Reading (phrases) 85 dB - rare min cue for intent Cognitive speech task 76dB (spelling opposite pairs)   03/24/23: SLP shaped pt's loud /a/, and provided education about rationale for ST, SpeakOut (SO) program components and pt must commit to 24 days of the program, and role of SLP in the rehab process  PATIENT EDUCATION: Education details: See "today's treatment" Person educated: Patient Education  method: Explanation, Demonstration, Verbal cues, and Handouts Education comprehension: verbalized understanding, returned demonstration, verbal cues required, and needs further education  HOME EXERCISE PROGRAM: 10 loud /a/ BID   GOALS: Goals reviewed with patient? No  SHORT TERM GOALS: Target date: 04/24/23   Pt will demo rotary chewing pattern with POs during session x2 Baseline: Goal status: INITIAL   2.  Pt will complete vocal warm up tasks (M-words, ah, glides, counting) with rare min A in 2 sessions Baseline:  Goal status: INITIAL   3.  Pt will improve speech volume in 5 minutes simple conversation to average 73 dB in 3 sessions Baseline:  Goal status: INITIAL   4.  Pt will demonstrate completion of SO home exercises as prescribed 90% compliance in first 2 weeks Baseline:  Goal status: INITIAL  5.  Pt will complete effortful swallow with independence in 2 sessions Baseline:  Goal status: INITIAL     LONG TERM GOALS: Target date: 1011/24   1.Pt will complete vocal warm up tasks (M-words, ah, glides, counting) with modified independence in 3 sessions Baseline:  Goal status: INITIAL   2.  Pt will improve speech volume in 10 minutes simple-mod  complex conversation to low 70s dB in 3 sessions Baseline:  Goal status: INITIAL   3.  In last 1-2 sessions, PROM scores will improve compared to initial administration Baseline:  Goal status: INITIAL   4.  Pt will tell SLP what HEP to perform in order to maintain WNL volume speech over time Baseline:  Goal status: INITIAL  ASSESSMENT:  CLINICAL IMPRESSION: "Ashley Neal" is a 77 y.o. F who was seen today for treatment of speech/voice in light of PD. SLP recommended effortful swallow exercise x20/day x8 weeks then x3/week after that. Ashley Neal told SLP she is getting 20-25/day in consistently. SLP will cont to monitor swallow function and accuracy of effortful swallow exercise during ST sessions, and MBS may be necessary during this  therapy course. With her speech, pt has noted that "The volume gets really low and the ends of my sentences are especially low at times." Pt has agreed to perform SO therapy with SLP for 24 days and a ST therapy course of 17 possible visits.   OBJECTIVE IMPAIRMENTS: Objective impairments include dysarthria, voice disorder, and dysphagia. These impairments are limiting patient from household responsibilities, ADLs/IADLs, effectively communicating at home and in community, and safety when swallowing.Factors affecting potential to achieve goals and functional outcome are  none noted today . Patient will benefit from skilled SLP services to address above impairments and improve overall function.  REHAB POTENTIAL: Good  PLAN:  SLP FREQUENCY: 2x/week  SLP DURATION: 8 weeks  PLANNED INTERVENTIONS: Aspiration precaution training, Pharyngeal strengthening exercises, Diet toleration management , Environmental controls, Trials of upgraded texture/liquids, Functional tasks, SLP instruction and feedback, Compensatory strategies, Patient/family education, and possible MBS    Kristi Hyer, CCC-SLP 04/02/2023, 3:00 PM

## 2023-04-02 NOTE — Patient Instructions (Signed)
[email or web address]

## 2023-04-07 ENCOUNTER — Ambulatory Visit: Payer: PPO

## 2023-04-07 DIAGNOSIS — R1310 Dysphagia, unspecified: Secondary | ICD-10-CM

## 2023-04-07 DIAGNOSIS — R471 Dysarthria and anarthria: Secondary | ICD-10-CM

## 2023-04-07 DIAGNOSIS — M6281 Muscle weakness (generalized): Secondary | ICD-10-CM | POA: Diagnosis not present

## 2023-04-07 NOTE — Therapy (Signed)
OUTPATIENT SPEECH LANGUAGE PATHOLOGY [facility]   Patient Name: Ashley Neal MRN: [id number] DOB:[birth year], 77 y.o., female Today's Date: 04/07/2023  PCP: Abbey Chatters NP REFERRING PROVIDER: Marzetta Board, MD  END OF SESSION:  End of Session - 04/07/23 1603     Visit Number 5    Number of Visits 17    Date for SLP Re-Evaluation 05/22/23    SLP Start Time 1448    SLP Stop Time  1531    SLP Time Calculation (min) 43 min    Activity Tolerance Patient tolerated treatment well                Past Medical History:  Diagnosis Date   Anxiety    Basal cell carcinoma    Depression    Hypertension    Neuromuscular disorder (HCC)    Osteoarthritis    Osteopenia    Parkinson disease    Pneumonia    Tremors of nervous system    legs   uterine ca 10/08/2021   also endometrial cancer   Past Surgical History:  Procedure Laterality Date   CARPAL TUNNEL RELEASE Bilateral 1996/1997   CYSTOSCOPY N/A 10/08/2021   Procedure: CYSTOSCOPY;  Surgeon: Carver Fila, MD;  Location: [facility];  Service: Gynecology;  Laterality: N/A;   ROBOTIC ASSISTED TOTAL HYSTERECTOMY WITH BILATERAL SALPINGO OOPHERECTOMY N/A 10/08/2021   Procedure: XI ROBOTIC ASSISTED TOTAL HYSTERECTOMY WITH BILATERAL SALPINGO OOPHORECTOMY;SENTINEL LYMPH NODE INJECTION;  Surgeon: Carver Fila, MD;  Location: [facility];  Service: Gynecology;  Laterality: N/A;   SQUAMOUS CELL CARCINOMA EXCISION Left 2017   Left jawline   TUBAL LIGATION  1980's   Patient Active Problem List   Diagnosis Date Noted   Endometrial cancer (HCC) 10/16/2021   Complex atypical endometrial hyperplasia 09/27/2021   Primary osteoarthritis of both hands 05/14/2020   Primary osteoarthritis of both feet 05/14/2020   Age-related osteoporosis without current pathological fracture 05/14/2020   Prediabetes 05/14/2020   Essential hypertension 05/14/2020   History of hyperlipidemia 05/14/2020   History of squamous cell  carcinoma 05/14/2020   Parkinsonism 05/14/2020    ONSET DATE: approx 2-3 months ago  REFERRING DIAG: G20.A1 (ICD-10-CM) - Parkinson's disease without dyskinesia, without mention of fluctuations   THERAPY DIAG:  Dysarthria and anarthria  Dysphagia, unspecified type  Rationale for Evaluation and Treatment: Rehabilitation  SUBJECTIVE:   SUBJECTIVE STATEMENT: Pt continues doing effortful swallow each day with meds, 20-25 reps/day. Pt accompanied by: self  PERTINENT HISTORY: HTN, depression, osteoarthritis, chronic back pain   PAIN:  Are you having pain?   Yes: NPRS scale: 4/10  Pain location: lower back pain Pain description: sore, throbbing at times Aggravating factors: sitting Relieving factors: medication, massage  FALLS: Has patient fallen in last 6 months?  See PT evaluation for details  LIVING ENVIRONMENT: Lives with: lives alone Lives in: House/apartment  PLOF:  Level of assistance: Independent with ADLs, Independent with IADLs Employment: Retired  PATIENT GOALS: Voice improvement  OBJECTIVE:    PATIENT REPORTED OUTCOME MEASURES (PROM): Communication Effectiveness Survey: provided 03/30/23  and pt scored herself 21/32 (lower scores indicate less effective communication). "1" ("not effective") with speaking to a friend when upset, and "4" ("very effective") having a conversation with family at home, and being part of a conversation in a noisy situation.  TODAY'S TREATMENT:  DATE:   SO = SPEAKOUT!   04/07/23: SLP utilized SO today to improve speech loudness and clarity with a focus on intent and the deliberate nature of speech. Pt completed Lesson 8 yesterday so SLP and pt performed Lesson 9. M-words: 84dB after SLP model/simultaneous production to maximize intent (initial - 78dB) Sustained "ah" 83dB initially; With model and SLP  min cues pt incr'd loudness to 87dB. Glides pt averaged 82dB initially, with SLP cues for gliding instead of stepping. Then improved to average 83dB with occasional min cues for intent Counting - average 87dB with initial model and fading cues to occasional min A for intent and continuous voicing. Cognitive (divergent naming - average 83dB, with occasional min A for intent  Minmal carryover to speech other than exercises, except with SLP cues for intentional speech.  04/02/23: Pt has been using SO each day, but lost the link for the second lesson each day online, so only performed once/day. SLP reviewed need for intentional and purposeful speech with exercises. Told pt to repeat the lessons she rec'd last session until next session and use the link provided to access online SO lessons once/day as well.  M-words: 85dB after SLP model/simultaneous production to maximize intent (initial - 82dB) Sustained "ah" 83 dB initially, and with model and SLP min cues pt incr'd loudness to 85dB. Glides pt averaged 83dB with softer production at end of glide downward. Counting - average 88dB with initial model  Cognitive (alphabet) - average 87dB, and word with each letter of alphabet average 84 dB with rare min A for intent   03/30/23: SLP provided pt with CES (see above for result). SLP used SpeakOut! Program to focus on pt's intent and purpose while speaking in order to improve verbal communication. SLP lead pt through exercises with averages this date:  M-words: 85dB after model/simultaneous production to maximize intent Sustained "ah" 86 dB after consistent mod A for intent, and usual mod A faded to rare min A for proper breath support Reading (phrases) 85 dB independently Cognitive speech task 73dB Pt to cont to work on SO on her own. Pt without overt s/sx dysphagia with 7 sips water today.  03/26/23: SLP and pt completed Lesson 1 for SO. SLP instructed pt how to complete program, she will choose to do  online session for her second session each day.  SLP targeted improved vocal quality and increasing intensity through progressively difficulty speech tasks using Speak Out! program, lesson 1. ST lead pt through exercises with averages this date:  M-words: 83dB after consistent mod A for intent Sustained "ah" 84 dB after consistent mod A for intent and usual mod A faded to occasional min A for proper breath support Reading (phrases) 85 dB - rare min cue for intent Cognitive speech task 76dB (spelling opposite pairs)   03/24/23: SLP shaped pt's loud /a/, and provided education about rationale for ST, SpeakOut (SO) program components and pt must commit to 24 days of the program, and role of SLP in the rehab process  PATIENT EDUCATION: Education details: See "today's treatment" Person educated: Patient Education method: Explanation, Demonstration, Verbal cues, and Handouts Education comprehension: verbalized understanding, returned demonstration, verbal cues required, and needs further education  HOME EXERCISE PROGRAM: 10 loud /a/ BID   GOALS: Goals reviewed with patient? No  SHORT TERM GOALS: Target date: 04/24/23   Pt will demo rotary chewing pattern with POs during session x2 Baseline: Goal status: INITIAL   2.  Pt will complete vocal warm up  tasks (M-words, ah, glides, counting) with rare min A in 2 sessions Baseline:  Goal status: INITIAL   3.  Pt will improve speech volume in 5 minutes simple conversation to average 73 dB in 3 sessions Baseline:  Goal status: INITIAL   4.  Pt will demonstrate completion of SO home exercises as prescribed 90% compliance in first 2 weeks Baseline:  Goal status: INITIAL  5.  Pt will complete effortful swallow with independence in 2 sessions Baseline:  Goal status: INITIAL     LONG TERM GOALS: Target date: 1011/24   1.Pt will complete vocal warm up tasks (M-words, ah, glides, counting) with modified independence in 3 sessions Baseline:   Goal status: INITIAL   2.  Pt will improve speech volume in 10 minutes simple-mod complex conversation to low 70s dB in 3 sessions Baseline:  Goal status: INITIAL   3.  In last 1-2 sessions, PROM scores will improve compared to initial administration Baseline:  Goal status: INITIAL   4.  Pt will tell SLP what HEP to perform in order to maintain WNL volume speech over time Baseline:  Goal status: INITIAL  ASSESSMENT:  CLINICAL IMPRESSION: "Ephrata" is a 77 y.o. F who was seen today for treatment of speech/voice in light of PD. SLP cont to recommended effortful swallow exercise x20/day until mid-October, then x3/week after that. Solomiya told SLP she continues to get 20-25/day in consistently. SLP will cont to monitor swallow function and accuracy of effortful swallow exercise during ST sessions, and MBS may be necessary during this therapy course. With her speech, pt has noted that "The volume gets really low and the ends of my sentences are especially low at times." Pt has agreed to perform SO therapy with SLP for 24 days and a ST therapy course of 17 possible visits.   OBJECTIVE IMPAIRMENTS: Objective impairments include dysarthria, voice disorder, and dysphagia. These impairments are limiting patient from household responsibilities, ADLs/IADLs, effectively communicating at home and in community, and safety when swallowing.Factors affecting potential to achieve goals and functional outcome are  none noted today . Patient will benefit from skilled SLP services to address above impairments and improve overall function.  REHAB POTENTIAL: Good  PLAN:  SLP FREQUENCY: 2x/week  SLP DURATION: 8 weeks  PLANNED INTERVENTIONS: Aspiration precaution training, Pharyngeal strengthening exercises, Diet toleration management , Environmental controls, Trials of upgraded texture/liquids, Functional tasks, SLP instruction and feedback, Compensatory strategies, Patient/family education, and possible  MBS    Jason Frisbee, CCC-SLP 04/07/2023, 4:04 PM  .swallow

## 2023-04-09 ENCOUNTER — Ambulatory Visit: Payer: PPO

## 2023-04-09 DIAGNOSIS — R471 Dysarthria and anarthria: Secondary | ICD-10-CM

## 2023-04-09 DIAGNOSIS — M6281 Muscle weakness (generalized): Secondary | ICD-10-CM | POA: Diagnosis not present

## 2023-04-09 DIAGNOSIS — R1310 Dysphagia, unspecified: Secondary | ICD-10-CM

## 2023-04-09 NOTE — Therapy (Signed)
OUTPATIENT SPEECH LANGUAGE PATHOLOGY [facility]   Patient Name: Ashley Neal MRN: [id number] DOB:09/23/1945, 77 y.o., female Today's Date: 04/09/2023  PCP: Abbey Chatters NP REFERRING PROVIDER: Marzetta Board, MD  END OF SESSION:  End of Session - 04/09/23 1456     Visit Number 6    Number of Visits 17    Date for SLP Re-Evaluation 05/22/23    SLP Start Time 1451    SLP Stop Time  1530    SLP Time Calculation (min) 39 min    Activity Tolerance Patient tolerated treatment well                Past Medical History:  Diagnosis Date   Anxiety    Basal cell carcinoma    Depression    Hypertension    Neuromuscular disorder (HCC)    Osteoarthritis    Osteopenia    Parkinson disease    Pneumonia    Tremors of nervous system    legs   uterine ca 10/08/2021   also endometrial cancer   Past Surgical History:  Procedure Laterality Date   CARPAL TUNNEL RELEASE Bilateral 1996/1997   CYSTOSCOPY N/A 10/08/2021   Procedure: CYSTOSCOPY;  Surgeon: Carver Fila, MD;  Location: [facility];  Service: Gynecology;  Laterality: N/A;   ROBOTIC ASSISTED TOTAL HYSTERECTOMY WITH BILATERAL SALPINGO OOPHERECTOMY N/A 10/08/2021   Procedure: XI ROBOTIC ASSISTED TOTAL HYSTERECTOMY WITH BILATERAL SALPINGO OOPHORECTOMY;SENTINEL LYMPH NODE INJECTION;  Surgeon: Carver Fila, MD;  Location: [facility];  Service: Gynecology;  Laterality: N/A;   SQUAMOUS CELL CARCINOMA EXCISION Left 2017   Left jawline   TUBAL LIGATION  1980's   Patient Active Problem List   Diagnosis Date Noted   Endometrial cancer (HCC) 10/16/2021   Complex atypical endometrial hyperplasia 09/27/2021   Primary osteoarthritis of both hands 05/14/2020   Primary osteoarthritis of both feet 05/14/2020   Age-related osteoporosis without current pathological fracture 05/14/2020   Prediabetes 05/14/2020   Essential hypertension 05/14/2020   History of hyperlipidemia 05/14/2020   History of squamous cell  carcinoma 05/14/2020   Parkinsonism 05/14/2020    ONSET DATE: approx 2-3 months ago  REFERRING DIAG: G20.A1 (ICD-10-CM) - Parkinson's disease without dyskinesia, without mention of fluctuations   THERAPY DIAG:  Dysarthria and anarthria  Dysphagia, unspecified type  Rationale for Evaluation and Treatment: Rehabilitation  SUBJECTIVE:   SUBJECTIVE STATEMENT: "I'm gonna yell at you?" Pt accompanied by: self  PERTINENT HISTORY: HTN, depression, osteoarthritis, chronic back pain   PAIN:  Are you having pain?   Yes: NPRS scale: 3/10  Pain location: lower back pain Pain description: sore, throbbing at times Aggravating factors: sitting Relieving factors: medication, massage  FALLS: Has patient fallen in last 6 months?  See PT evaluation for details  LIVING ENVIRONMENT: Lives with: lives alone Lives in: House/apartment  PLOF:  Level of assistance: Independent with ADLs, Independent with IADLs Employment: Retired  PATIENT GOALS: Voice improvement  OBJECTIVE:    PATIENT REPORTED OUTCOME MEASURES (PROM): Communication Effectiveness Survey: provided 03/30/23  and pt scored herself 21/32 (lower scores indicate less effective communication). "1" ("not effective") with speaking to a friend when upset, and "4" ("very effective") having a conversation with family at home, and being part of a conversation in a noisy situation.  TODAY'S TREATMENT:  DATE:   SO = SPEAKOUT!   04/09/23: SLP utilized SO today to improve speech loudness and clarity with a focus on intent and the deliberate nature of speech. SLP and pt performed Lesson 11 together today so SLP could provide feedback PRN. M-words: 84dB after SLP model/simultaneous production to maximize loudness/intent Sustained "ah" 86dB  With model and SLP min cues pt incr'd loudness to 87dB. Glides pt  averaged 85dB with SLP consistent mod cues for intent, faded to occasional min cues for intent. Counting - average 88dB with initial min cue for mindfulness/intent. Cognitive (making words from a longer word) - average 83dB, with independence Minmal carryover to speech other than exercises, except with SLP cues for intentional speech.  04/07/23: SLP utilized SO today to improve speech loudness and clarity with a focus on intent and the deliberate nature of speech. Pt completed Lesson 8 yesterday so SLP and pt performed Lesson 9. M-words: 84dB after SLP model/simultaneous production to maximize intent (initial - 78dB) Sustained "ah" 83dB initially; With model and SLP min cues pt incr'd loudness to 87dB. Glides pt averaged 82dB initially, with SLP cues for gliding instead of stepping. Then improved to average 83dB with occasional min cues for intent Counting - average 87dB with initial model and fading cues to occasional min A for intent and continuous voicing. Cognitive (divergent naming - average 83dB, with occasional min A for intent  Minmal carryover to speech other than exercises, except with SLP cues for intentional speech.  04/02/23: Pt has been using SO each day, but lost the link for the second lesson each day online, so only performed once/day. SLP reviewed need for intentional and purposeful speech with exercises. Told pt to repeat the lessons she rec'd last session until next session and use the link provided to access online SO lessons once/day as well.  M-words: 85dB after SLP model/simultaneous production to maximize intent (initial - 82dB) Sustained "ah" 83 dB initially, and with model and SLP min cues pt incr'd loudness to 85dB. Glides pt averaged 83dB with softer production at end of glide downward. Counting - average 88dB with initial model  Cognitive (alphabet) - average 87dB, and word with each letter of alphabet average 84 dB with rare min A for intent   03/30/23: SLP provided pt  with CES (see above for result). SLP used SpeakOut! Program to focus on pt's intent and purpose while speaking in order to improve verbal communication. SLP lead pt through exercises with averages this date:  M-words: 85dB after model/simultaneous production to maximize intent Sustained "ah" 86 dB after consistent mod A for intent, and usual mod A faded to rare min A for proper breath support Reading (phrases) 85 dB independently Cognitive speech task 73dB Pt to cont to work on SO on her own. Pt without overt s/sx dysphagia with 7 sips water today.  03/26/23: SLP and pt completed Lesson 1 for SO. SLP instructed pt how to complete program, she will choose to do online session for her second session each day.  SLP targeted improved vocal quality and increasing intensity through progressively difficulty speech tasks using Speak Out! program, lesson 1. ST lead pt through exercises with averages this date:  M-words: 83dB after consistent mod A for intent Sustained "ah" 84 dB after consistent mod A for intent and usual mod A faded to occasional min A for proper breath support Reading (phrases) 85 dB - rare min cue for intent Cognitive speech task 76dB (spelling opposite pairs)   03/24/23: SLP  shaped pt's loud /a/, and provided education about rationale for ST, SpeakOut (SO) program components and pt must commit to 24 days of the program, and role of SLP in the rehab process  PATIENT EDUCATION: Education details: See "today's treatment" Person educated: Patient Education method: Explanation, Demonstration, Verbal cues, and Handouts Education comprehension: verbalized understanding, returned demonstration, verbal cues required, and needs further education  HOME EXERCISE PROGRAM: 10 loud /a/ BID   GOALS: Goals reviewed with patient? No  SHORT TERM GOALS: Target date: 04/24/23   Pt will demo rotary chewing pattern with POs during session x2 Baseline: Goal status: INITIAL   2.  Pt will  complete vocal warm up tasks (M-words, ah, glides, counting) with rare min A in 2 sessions Baseline:  Goal status: INITIAL   3.  Pt will improve speech volume in 5 minutes simple conversation to average 73 dB in 3 sessions Baseline:  Goal status: INITIAL   4.  Pt will demonstrate completion of SO home exercises as prescribed 90% compliance in first 2 weeks Baseline:  Goal status: INITIAL  5.  Pt will complete effortful swallow with independence in 2 sessions Baseline:  Goal status: INITIAL     LONG TERM GOALS: Target date: 1011/24   1.Pt will complete vocal warm up tasks (M-words, ah, glides, counting) with modified independence in 3 sessions Baseline:  Goal status: INITIAL   2.  Pt will improve speech volume in 10 minutes simple-mod complex conversation to low 70s dB in 3 sessions Baseline:  Goal status: INITIAL   3.  In last 1-2 sessions, PROM scores will improve compared to initial administration Baseline:  Goal status: INITIAL   4.  Pt will tell SLP what HEP to perform in order to maintain WNL volume speech over time Baseline:  Goal status: INITIAL  ASSESSMENT:  CLINICAL IMPRESSION: "Rohnda" is a 77 y.o. F who was seen today for treatment of speech/voice in light of PD. She has been completing SO lessons routinely, as directed. SLP cont to recommended effortful swallow exercise x20/day until mid-October, then x3/week after that. L[place]ta told SLP she continues to get 20-25/day in consistently. SLP will cont to monitor swallow function and accuracy of effortful swallow exercise during ST sessions, and MBS may be necessary during this therapy course. With her speech, pt has noted that "The volume gets really low and the ends of my sentences are especially low at times." Pt has agreed to perform SO therapy with SLP for 24 days and a ST therapy course of 17 possible visits.   OBJECTIVE IMPAIRMENTS: Objective impairments include dysarthria, voice disorder, and dysphagia. These  impairments are limiting patient from household responsibilities, ADLs/IADLs, effectively communicating at home and in community, and safety when swallowing.Factors affecting potential to achieve goals and functional outcome are  none noted today . Patient will benefit from skilled SLP services to address above impairments and improve overall function.  REHAB POTENTIAL: Good  PLAN:  SLP FREQUENCY: 2x/week  SLP DURATION: 8 weeks  PLANNED INTERVENTIONS: Aspiration precaution training, Pharyngeal strengthening exercises, Diet toleration management , Environmental controls, Trials of upgraded texture/liquids, Functional tasks, SLP instruction and feedback, Compensatory strategies, Patient/family education, and possible MBS    Nolyn Swab, CCC-SLP 04/09/2023, 3:04 PM  .swallow

## 2023-04-10 ENCOUNTER — Encounter: Payer: PPO | Admitting: Nurse Practitioner

## 2023-04-14 ENCOUNTER — Ambulatory Visit: Payer: PPO | Attending: Nurse Practitioner

## 2023-04-14 DIAGNOSIS — R471 Dysarthria and anarthria: Secondary | ICD-10-CM | POA: Diagnosis not present

## 2023-04-14 DIAGNOSIS — R1310 Dysphagia, unspecified: Secondary | ICD-10-CM | POA: Diagnosis not present

## 2023-04-14 NOTE — Therapy (Signed)
OUTPATIENT SPEECH LANGUAGE PATHOLOGY [facility]   Patient Name: Ashley Neal MRN: [id number] DOB:1946/08/01, 77 y.o., female Today's Date: 04/14/2023  PCP: Abbey Chatters NP REFERRING PROVIDER: Marzetta Board, MD  END OF SESSION:  End of Session - 04/14/23 1414     Visit Number 7    Number of Visits 17    Date for SLP Re-Evaluation 05/22/23    SLP Start Time 1404    SLP Stop Time  1445    SLP Time Calculation (min) 41 min    Activity Tolerance Patient tolerated treatment well                Past Medical History:  Diagnosis Date   Anxiety    Basal cell carcinoma    Depression    Hypertension    Neuromuscular disorder (HCC)    Osteoarthritis    Osteopenia    Parkinson disease    Pneumonia    Tremors of nervous system    legs   uterine ca 10/08/2021   also endometrial cancer   Past Surgical History:  Procedure Laterality Date   CARPAL TUNNEL RELEASE Bilateral 1996/1997   CYSTOSCOPY N/A 10/08/2021   Procedure: CYSTOSCOPY;  Surgeon: Carver Fila, MD;  Location: [facility];  Service: Gynecology;  Laterality: N/A;   ROBOTIC ASSISTED TOTAL HYSTERECTOMY WITH BILATERAL SALPINGO OOPHERECTOMY N/A 10/08/2021   Procedure: XI ROBOTIC ASSISTED TOTAL HYSTERECTOMY WITH BILATERAL SALPINGO OOPHORECTOMY;SENTINEL LYMPH NODE INJECTION;  Surgeon: Carver Fila, MD;  Location: [facility];  Service: Gynecology;  Laterality: N/A;   SQUAMOUS CELL CARCINOMA EXCISION Left 2017   Left jawline   TUBAL LIGATION  1980's   Patient Active Problem List   Diagnosis Date Noted   Endometrial cancer (HCC) 10/16/2021   Complex atypical endometrial hyperplasia 09/27/2021   Primary osteoarthritis of both hands 05/14/2020   Primary osteoarthritis of both feet 05/14/2020   Age-related osteoporosis without current pathological fracture 05/14/2020   Prediabetes 05/14/2020   Essential hypertension 05/14/2020   History of hyperlipidemia 05/14/2020   History of squamous cell  carcinoma 05/14/2020   Parkinsonism 05/14/2020    ONSET DATE: approx 2-3 months ago  REFERRING DIAG: G20.A1 (ICD-10-CM) - Parkinson's disease without dyskinesia, without mention of fluctuations   THERAPY DIAG:  Dysarthria and anarthria  Dysphagia, unspecified type  Rationale for Evaluation and Treatment: Rehabilitation  SUBJECTIVE:   SUBJECTIVE STATEMENT: "My pain isn't bad today, actually." Pt accompanied by: self  PERTINENT HISTORY: HTN, depression, osteoarthritis, chronic back pain   PAIN:  Are you having pain?   Yes: NPRS scale: 3/10  Pain location: lower back pain Pain description: sore, throbbing at times Aggravating factors: sitting Relieving factors: medication, massage  FALLS: Has patient fallen in last 6 months?  See PT evaluation for details  LIVING ENVIRONMENT: Lives with: lives alone Lives in: House/apartment  PLOF:  Level of assistance: Independent with ADLs, Independent with IADLs Employment: Retired  PATIENT GOALS: Voice improvement  OBJECTIVE:    PATIENT REPORTED OUTCOME MEASURES (PROM): Communication Effectiveness Survey: provided 03/30/23  and pt scored herself 21/32 (lower scores indicate less effective communication). "1" ("not effective") with speaking to a friend when upset, and "4" ("very effective") having a conversation with family at home, and being part of a conversation in a noisy situation.  TODAY'S TREATMENT:  DATE:   SO = SPEAKOUT!   04/14/23: SLP utilized SO today to improve speech loudness and clarity with a focus on intent and the deliberate nature of speech. SLP and pt performed Lesson 15 together today so SLP could provide feedback PRN. M-words: 81dB initially, incr'd to 85 dB after SLP model/simultaneous production to maximize loudness/intent Sustained "ah" 83dB initially, and improved with model  and SLP min cues to 86dB. Glides pt averaged 82dB with SLP consistent mod cues for intent, faded to occasional min cues for intent. Counting - average 86dB with initial min cue for mindfulness/intent. Cognitive not completed due to time constraints. Pt was told to complete at home and then to cont with an online session or repeat this lesson later today. Rare intentional speech carrying over to conversation today.  04/09/23: SLP utilized SO today to improve speech loudness and clarity with a focus on intent and the deliberate nature of speech. SLP and pt performed Lesson 11 together today so SLP could provide feedback PRN. M-words: 84dB after SLP model/simultaneous production to maximize loudness/intent Sustained "ah" 86dB  With model and SLP min cues pt incr'd loudness to 87dB. Glides pt averaged 85dB with SLP consistent mod cues for intent, faded to occasional min cues for intent. Counting - average 88dB with initial min cue for mindfulness/intent. Cognitive (making words from a longer word) - average 83dB, with independence Minmal carryover to speech other than exercises, except with SLP cues for intentional speech.  04/07/23: SLP utilized SO today to improve speech loudness and clarity with a focus on intent and the deliberate nature of speech. Pt completed Lesson 8 yesterday so SLP and pt performed Lesson 9. M-words: 84dB after SLP model/simultaneous production to maximize intent (initial - 78dB) Sustained "ah" 83dB initially; With model and SLP min cues pt incr'd loudness to 87dB. Glides pt averaged 82dB initially, with SLP cues for gliding instead of stepping. Then improved to average 83dB with occasional min cues for intent Counting - average 87dB with initial model and fading cues to occasional min A for intent and continuous voicing. Cognitive (divergent naming - average 83dB, with occasional min A for intent  Minmal carryover to speech other than exercises, except with SLP cues for  intentional speech.  04/02/23: Pt has been using SO each day, but lost the link for the second lesson each day online, so only performed once/day. SLP reviewed need for intentional and purposeful speech with exercises. Told pt to repeat the lessons she rec'd last session until next session and use the link provided to access online SO lessons once/day as well.  M-words: 85dB after SLP model/simultaneous production to maximize intent (initial - 82dB) Sustained "ah" 83 dB initially, and with model and SLP min cues pt incr'd loudness to 85dB. Glides pt averaged 83dB with softer production at end of glide downward. Counting - average 88dB with initial model  Cognitive (alphabet) - average 87dB, and word with each letter of alphabet average 84 dB with rare min A for intent   03/30/23: SLP provided pt with CES (see above for result). SLP used SpeakOut! Program to focus on pt's intent and purpose while speaking in order to improve verbal communication. SLP lead pt through exercises with averages this date:  M-words: 85dB after model/simultaneous production to maximize intent Sustained "ah" 86 dB after consistent mod A for intent, and usual mod A faded to rare min A for proper breath support Reading (phrases) 85 dB independently Cognitive speech task 73dB Pt to cont  to work on SO on her own. Pt without overt s/sx dysphagia with 7 sips water today.  03/26/23: SLP and pt completed Lesson 1 for SO. SLP instructed pt how to complete program, she will choose to do online session for her second session each day.  SLP targeted improved vocal quality and increasing intensity through progressively difficulty speech tasks using Speak Out! program, lesson 1. ST lead pt through exercises with averages this date:  M-words: 83dB after consistent mod A for intent Sustained "ah" 84 dB after consistent mod A for intent and usual mod A faded to occasional min A for proper breath support Reading (phrases) 85 dB - rare min  cue for intent Cognitive speech task 76dB (spelling opposite pairs)   03/24/23: SLP shaped pt's loud /a/, and provided education about rationale for ST, SpeakOut (SO) program components and pt must commit to 24 days of the program, and role of SLP in the rehab process  PATIENT EDUCATION: Education details: See "today's treatment" Person educated: Patient Education method: Explanation, Demonstration, Verbal cues, and Handouts Education comprehension: verbalized understanding, returned demonstration, verbal cues required, and needs further education  HOME EXERCISE PROGRAM: 10 loud /a/ BID   GOALS: Goals reviewed with patient? No  SHORT TERM GOALS: Target date: 04/24/23   Pt will demo rotary chewing pattern with POs during session x2 Baseline: Goal status: INITIAL   2.  Pt will complete vocal warm up tasks (M-words, ah, glides, counting) with rare min A in 2 sessions Baseline:  Goal status: INITIAL   3.  Pt will improve speech volume in 5 minutes simple conversation to average 73 dB in 3 sessions Baseline:  Goal status: INITIAL   4.  Pt will demonstrate completion of SO home exercises as prescribed 90% compliance in first 2 weeks Baseline:  Goal status: INITIAL  5.  Pt will complete effortful swallow with independence in 2 sessions Baseline:  Goal status: INITIAL     LONG TERM GOALS: Target date: 1011/24   1.Pt will complete vocal warm up tasks (M-words, ah, glides, counting) with modified independence in 3 sessions Baseline:  Goal status: INITIAL   2.  Pt will improve speech volume in 10 minutes simple-mod complex conversation to low 70s dB in 3 sessions Baseline:  Goal status: INITIAL   3.  In last 1-2 sessions, PROM scores will improve compared to initial administration Baseline:  Goal status: INITIAL   4.  Pt will tell SLP what HEP to perform in order to maintain WNL volume speech over time Baseline:  Goal status: INITIAL  ASSESSMENT:  CLINICAL  IMPRESSION: "Ashley Neal" is a 77 y.o. F who was seen today for treatment of speech/voice in light of PD. She has been completing SO lessons routinely, as directed. SLP cont to recommended effortful swallow exercise x20/day until mid-October, then x3/week after that. Ashley Neal told SLP she continues to get 20-25/day in consistently. SLP will cont to monitor swallow function and accuracy of effortful swallow exercise during ST sessions, and MBS may be necessary during this therapy course. With her speech, pt has noted that "The volume gets really low and the ends of my sentences are especially low at times." Pt has agreed to perform SO therapy with SLP for 24 days and a ST therapy course of 17 possible visits.   OBJECTIVE IMPAIRMENTS: Objective impairments include dysarthria, voice disorder, and dysphagia. These impairments are limiting patient from household responsibilities, ADLs/IADLs, effectively communicating at home and in community, and safety when swallowing.Factors affecting potential to  achieve goals and functional outcome are  none noted today . Patient will benefit from skilled SLP services to address above impairments and improve overall function.  REHAB POTENTIAL: Good  PLAN:  SLP FREQUENCY: 2x/week  SLP DURATION: 8 weeks  PLANNED INTERVENTIONS: Aspiration precaution training, Pharyngeal strengthening exercises, Diet toleration management , Environmental controls, Trials of upgraded texture/liquids, Functional tasks, SLP instruction and feedback, Compensatory strategies, Patient/family education, and possible MBS    Zoha Spranger, CCC-SLP 04/14/2023, 2:15 PM  .swallow

## 2023-04-15 DIAGNOSIS — M545 Low back pain, unspecified: Secondary | ICD-10-CM | POA: Diagnosis not present

## 2023-04-16 ENCOUNTER — Ambulatory Visit: Payer: PPO

## 2023-04-16 DIAGNOSIS — R471 Dysarthria and anarthria: Secondary | ICD-10-CM | POA: Diagnosis not present

## 2023-04-16 DIAGNOSIS — R1310 Dysphagia, unspecified: Secondary | ICD-10-CM

## 2023-04-16 NOTE — Therapy (Signed)
OUTPATIENT SPEECH LANGUAGE PATHOLOGY [facility]   Patient Name: Ashley Neal MRN: [id number] DOB:May 06, 1946, 77 y.o., female Today's Date: 04/16/2023  PCP: Abbey Chatters NP REFERRING PROVIDER: Marzetta Board, MD  END OF SESSION:  End of Session - 04/16/23 1544     Visit Number 8    Number of Visits 17    Date for SLP Re-Evaluation 05/22/23    SLP Start Time 1535    SLP Stop Time  1615    SLP Time Calculation (min) 40 min    Activity Tolerance Patient tolerated treatment well                Past Medical History:  Diagnosis Date   Anxiety    Basal cell carcinoma    Depression    Hypertension    Neuromuscular disorder (HCC)    Osteoarthritis    Osteopenia    Parkinson disease    Pneumonia    Tremors of nervous system    legs   uterine ca 10/08/2021   also endometrial cancer   Past Surgical History:  Procedure Laterality Date   CARPAL TUNNEL RELEASE Bilateral 1996/1997   CYSTOSCOPY N/A 10/08/2021   Procedure: CYSTOSCOPY;  Surgeon: Carver Fila, MD;  Location: [facility];  Service: Gynecology;  Laterality: N/A;   ROBOTIC ASSISTED TOTAL HYSTERECTOMY WITH BILATERAL SALPINGO OOPHERECTOMY N/A 10/08/2021   Procedure: XI ROBOTIC ASSISTED TOTAL HYSTERECTOMY WITH BILATERAL SALPINGO OOPHORECTOMY;SENTINEL LYMPH NODE INJECTION;  Surgeon: Carver Fila, MD;  Location: [facility];  Service: Gynecology;  Laterality: N/A;   SQUAMOUS CELL CARCINOMA EXCISION Left 2017   Left jawline   TUBAL LIGATION  1980's   Patient Active Problem List   Diagnosis Date Noted   Endometrial cancer (HCC) 10/16/2021   Complex atypical endometrial hyperplasia 09/27/2021   Primary osteoarthritis of both hands 05/14/2020   Primary osteoarthritis of both feet 05/14/2020   Age-related osteoporosis without current pathological fracture 05/14/2020   Prediabetes 05/14/2020   Essential hypertension 05/14/2020   History of hyperlipidemia 05/14/2020   History of squamous cell  carcinoma 05/14/2020   Parkinsonism 05/14/2020    ONSET DATE: approx 2-3 months ago  REFERRING DIAG: G20.A1 (ICD-10-CM) - Parkinson's disease without dyskinesia, without mention of fluctuations   THERAPY DIAG:  Dysarthria and anarthria  Dysphagia, unspecified type  Rationale for Evaluation and Treatment: Rehabilitation  SUBJECTIVE:   SUBJECTIVE STATEMENT: "My pain isn't bad today, actually." Pt accompanied by: self  PERTINENT HISTORY: HTN, depression, osteoarthritis, chronic back pain   PAIN:  Are you having pain?   Yes: NPRS scale: 3/10  Pain location: lower back pain Pain description: sore, throbbing at times Aggravating factors: sitting Relieving factors: medication, massage  FALLS: Has patient fallen in last 6 months?  See PT evaluation for details  LIVING ENVIRONMENT: Lives with: lives alone Lives in: House/apartment  PLOF:  Level of assistance: Independent with ADLs, Independent with IADLs Employment: Retired  PATIENT GOALS: Voice improvement  OBJECTIVE:    PATIENT REPORTED OUTCOME MEASURES (PROM): Communication Effectiveness Survey: provided 03/30/23  and pt scored herself 21/32 (lower scores indicate less effective communication). "1" ("not effective") with speaking to a friend when upset, and "4" ("very effective") having a conversation with family at home, and being part of a conversation in a noisy situation.  TODAY'S TREATMENT:  DATE:   SO = SPEAKOUT!   04/16/23: SLP targeted improving vocal quality and increasing intensity through progressively difficulty speech tasks using Speak Out! program, lesson 16. ST lead pt through exercises providing occasional model prior to pt execution. occasional min-A required to achieve target dB this date. Averages this date:  Sustained "ah" 85 dB reading 76dB cognitive speech task 68  dB.  Conversational sample of approx 3 minutes, pt averages upper 60s dB with occasional mod-A.   04/14/23: SLP utilized SO today to improve speech loudness and clarity with a focus on intent and the deliberate nature of speech. SLP and pt performed Lesson 15 together today so SLP could provide feedback PRN. M-words: 81dB initially, incr'd to 85 dB after SLP model/simultaneous production to maximize loudness/intent Sustained "ah" 83dB initially, and improved with model and SLP min cues to 86dB. Glides pt averaged 82dB with SLP consistent mod cues for intent, faded to occasional min cues for intent. Counting - average 86dB with initial min cue for mindfulness/intent. Cognitive not completed due to time constraints. Pt was told to complete at home and then to cont with an online session or repeat this lesson later today. Rare intentional speech carrying over to conversation today.  04/09/23: SLP utilized SO today to improve speech loudness and clarity with a focus on intent and the deliberate nature of speech. SLP and pt performed Lesson 11 together today so SLP could provide feedback PRN. M-words: 84dB after SLP model/simultaneous production to maximize loudness/intent Sustained "ah" 86dB  With model and SLP min cues pt incr'd loudness to 87dB. Glides pt averaged 85dB with SLP consistent mod cues for intent, faded to occasional min cues for intent. Counting - average 88dB with initial min cue for mindfulness/intent. Cognitive (making words from a longer word) - average 83dB, with independence Minmal carryover to speech other than exercises, except with SLP cues for intentional speech.  04/07/23: SLP utilized SO today to improve speech loudness and clarity with a focus on intent and the deliberate nature of speech. Pt completed Lesson 8 yesterday so SLP and pt performed Lesson 9. M-words: 84dB after SLP model/simultaneous production to maximize intent (initial - 78dB) Sustained "ah" 83dB initially;  With model and SLP min cues pt incr'd loudness to 87dB. Glides pt averaged 82dB initially, with SLP cues for gliding instead of stepping. Then improved to average 83dB with occasional min cues for intent Counting - average 87dB with initial model and fading cues to occasional min A for intent and continuous voicing. Cognitive (divergent naming - average 83dB, with occasional min A for intent  Minmal carryover to speech other than exercises, except with SLP cues for intentional speech.  04/02/23: Pt has been using SO each day, but lost the link for the second lesson each day online, so only performed once/day. SLP reviewed need for intentional and purposeful speech with exercises. Told pt to repeat the lessons she rec'd last session until next session and use the link provided to access online SO lessons once/day as well.  M-words: 85dB after SLP model/simultaneous production to maximize intent (initial - 82dB) Sustained "ah" 83 dB initially, and with model and SLP min cues pt incr'd loudness to 85dB. Glides pt averaged 83dB with softer production at end of glide downward. Counting - average 88dB with initial model  Cognitive (alphabet) - average 87dB, and word with each letter of alphabet average 84 dB with rare min A for intent   03/30/23: SLP provided pt with CES (see above for  result). SLP used SpeakOut! Program to focus on pt's intent and purpose while speaking in order to improve verbal communication. SLP lead pt through exercises with averages this date:  M-words: 85dB after model/simultaneous production to maximize intent Sustained "ah" 86 dB after consistent mod A for intent, and usual mod A faded to rare min A for proper breath support Reading (phrases) 85 dB independently Cognitive speech task 73dB Pt to cont to work on SO on her own. Pt without overt s/sx dysphagia with 7 sips water today.  03/26/23: SLP and pt completed Lesson 1 for SO. SLP instructed pt how to complete program, she  will choose to do online session for her second session each day.  SLP targeted improved vocal quality and increasing intensity through progressively difficulty speech tasks using Speak Out! program, lesson 1. ST lead pt through exercises with averages this date:  M-words: 83dB after consistent mod A for intent Sustained "ah" 84 dB after consistent mod A for intent and usual mod A faded to occasional min A for proper breath support Reading (phrases) 85 dB - rare min cue for intent Cognitive speech task 76dB (spelling opposite pairs)   03/24/23: SLP shaped pt's loud /a/, and provided education about rationale for ST, SpeakOut (SO) program components and pt must commit to 24 days of the program, and role of SLP in the rehab process  PATIENT EDUCATION: Education details: See "today's treatment" Person educated: Patient Education method: Explanation, Demonstration, Verbal cues, and Handouts Education comprehension: verbalized understanding, returned demonstration, verbal cues required, and needs further education  HOME EXERCISE PROGRAM: 10 loud /a/ BID   GOALS: Goals reviewed with patient? No  SHORT TERM GOALS: Target date: 04/24/23   Pt will demo rotary chewing pattern with POs during session x2 Baseline: Goal status: INITIAL   2.  Pt will complete vocal warm up tasks (M-words, ah, glides, counting) with rare min A in 2 sessions Baseline:  Goal status: INITIAL   3.  Pt will improve speech volume in 5 minutes simple conversation to average 73 dB in 3 sessions Baseline:  Goal status: INITIAL   4.  Pt will demonstrate completion of SO home exercises as prescribed 90% compliance in first 2 weeks Baseline:  Goal status: met  5.  Pt will complete effortful swallow with independence in 2 sessions Baseline:  Goal status: INITIAL     LONG TERM GOALS: Target date: 1011/24   1.Pt will complete vocal warm up tasks (M-words, ah, glides, counting) with modified independence in 3  sessions Baseline:  Goal status: INITIAL   2.  Pt will improve speech volume in 10 minutes simple-mod complex conversation to low 70s dB in 3 sessions Baseline:  Goal status: INITIAL   3.  In last 1-2 sessions, PROM scores will improve compared to initial administration Baseline:  Goal status: INITIAL   4.  Pt will tell SLP what HEP to perform in order to maintain WNL volume speech over time Baseline:  Goal status: INITIAL  ASSESSMENT:  CLINICAL IMPRESSION: "Ameyalli" is a 77 y.o. F who was seen today for treatment of speech/voice in light of PD. She has been completing SO lessons routinely, as directed. SLP cont to recommended effortful swallow exercise x20/day until mid-October, then x3/week after that. Alailah told SLP she continues to get 20-25/day in consistently. SLP will cont to monitor swallow function and accuracy of effortful swallow exercise during ST sessions, and MBS may be necessary during this therapy course. With her speech, pt has noted  that "The volume gets really low and the ends of my sentences are especially low at times." Pt has agreed to perform SO therapy with SLP for 24 days and a ST therapy course of 17 possible visits.   OBJECTIVE IMPAIRMENTS: Objective impairments include dysarthria, voice disorder, and dysphagia. These impairments are limiting patient from household responsibilities, ADLs/IADLs, effectively communicating at home and in community, and safety when swallowing.Factors affecting potential to achieve goals and functional outcome are  none noted today . Patient will benefit from skilled SLP services to address above impairments and improve overall function.  REHAB POTENTIAL: Good  PLAN:  SLP FREQUENCY: 2x/week  SLP DURATION: 8 weeks  PLANNED INTERVENTIONS: Aspiration precaution training, Pharyngeal strengthening exercises, Diet toleration management , Environmental controls, Trials of upgraded texture/liquids, Functional tasks, SLP instruction and  feedback, Compensatory strategies, Patient/family education, and possible MBS    Skylin Kennerson, CCC-SLP 04/16/2023, 3:44 PM  .swallow

## 2023-04-21 ENCOUNTER — Ambulatory Visit: Payer: PPO

## 2023-04-21 DIAGNOSIS — R293 Abnormal posture: Secondary | ICD-10-CM | POA: Diagnosis not present

## 2023-04-21 DIAGNOSIS — M2569 Stiffness of other specified joint, not elsewhere classified: Secondary | ICD-10-CM | POA: Diagnosis not present

## 2023-04-21 DIAGNOSIS — M5451 Vertebrogenic low back pain: Secondary | ICD-10-CM | POA: Diagnosis not present

## 2023-04-21 DIAGNOSIS — M6281 Muscle weakness (generalized): Secondary | ICD-10-CM | POA: Diagnosis not present

## 2023-04-23 ENCOUNTER — Ambulatory Visit: Payer: PPO

## 2023-04-23 DIAGNOSIS — R1310 Dysphagia, unspecified: Secondary | ICD-10-CM

## 2023-04-23 DIAGNOSIS — R471 Dysarthria and anarthria: Secondary | ICD-10-CM

## 2023-04-23 NOTE — Therapy (Signed)
OUTPATIENT SPEECH LANGUAGE PATHOLOGY PARKINSON'S TREATMENT   Patient Name: Ashley Neal MRN: [id number] DOB:1945/11/16, 77 y.o., female Today's Date: 04/23/2023  PCP: Abbey Chatters NP REFERRING PROVIDER: Marzetta Board, MD  END OF SESSION:       Past Medical History:  Diagnosis Date   Anxiety    Basal cell carcinoma    Depression    Hypertension    Neuromuscular disorder (HCC)    Osteoarthritis    Osteopenia    Parkinson disease    Pneumonia    Tremors of nervous system    legs   uterine ca 10/08/2021   also endometrial cancer   Past Surgical History:  Procedure Laterality Date   CARPAL TUNNEL RELEASE Bilateral 1996/1997   CYSTOSCOPY N/A 10/08/2021   Procedure: CYSTOSCOPY;  Surgeon: Carver Fila, MD;  Location: [facility];  Service: Gynecology;  Laterality: N/A;   ROBOTIC ASSISTED TOTAL HYSTERECTOMY WITH BILATERAL SALPINGO OOPHERECTOMY N/A 10/08/2021   Procedure: XI ROBOTIC ASSISTED TOTAL HYSTERECTOMY WITH BILATERAL SALPINGO OOPHORECTOMY;SENTINEL LYMPH NODE INJECTION;  Surgeon: Carver Fila, MD;  Location: [facility];  Service: Gynecology;  Laterality: N/A;   SQUAMOUS CELL CARCINOMA EXCISION Left 2017   Left jawline   TUBAL LIGATION  1980's   Patient Active Problem List   Diagnosis Date Noted   Endometrial cancer (HCC) 10/16/2021   Complex atypical endometrial hyperplasia 09/27/2021   Primary osteoarthritis of both hands 05/14/2020   Primary osteoarthritis of both feet 05/14/2020   Age-related osteoporosis without current pathological fracture 05/14/2020   Prediabetes 05/14/2020   Essential hypertension 05/14/2020   History of hyperlipidemia 05/14/2020   History of squamous cell carcinoma 05/14/2020   Parkinsonism 05/14/2020    ONSET DATE: approx 2-3 months ago  REFERRING DIAG: G20.A1 (ICD-10-CM) - Parkinson's disease without dyskinesia, without mention of fluctuations   THERAPY DIAG:  Dysarthria and anarthria  Dysphagia, unspecified  type  Rationale for Evaluation and Treatment: Rehabilitation  SUBJECTIVE:   SUBJECTIVE STATEMENT: Pt expressed frustration about her back pain Pt accompanied by: self  PERTINENT HISTORY: HTN, depression, osteoarthritis, chronic back pain   PAIN:  Are you having pain?   Yes: NPRS scale: 7/10  Pain location: lower back pain Pain description: sore, throbbing at times Aggravating factors: sitting Relieving factors: medication, massage  FALLS: Has patient fallen in last 6 months?  See PT evaluation for details  LIVING ENVIRONMENT: Lives with: lives alone Lives in: House/apartment  PLOF:  Level of assistance: Independent with ADLs, Independent with IADLs Employment: Retired  PATIENT GOALS: Voice improvement  OBJECTIVE:    PATIENT REPORTED OUTCOME MEASURES (PROM): Communication Effectiveness Survey: provided 03/30/23  and pt scored herself 21/32 (lower scores indicate less effective communication). "1" ("not effective") with speaking to a friend when upset, and "4" ("very effective") having a conversation with family at home, and being part of a conversation in a noisy situation.  TODAY'S TREATMENT:  DATE:   SO = SPEAKOUT!   04/23/23: SLP targeted improving vocal quality and increasing intensity through progressively difficulty speech tasks using SO program, lesson 24. ST lead pt through exercises providing occasional model prior to pt execution. Averages this date:  M-words: average 86 dB  Sustained "ah" 85 dB average Glides: 82dB average reading 76dB cognitive speech task 73 dB average Conversational sample of approx 5 minutes, pt averages upper 60s dB with occasional mod-A. SLP to cont to work with pt for louder speech. Pt states people do not typically have issues understanding or hearing her the last 3 weeks.   04/16/23: SLP targeted  improving vocal quality and increasing intensity through progressively difficulty speech tasks using Speak Out! program, lesson 16. ST lead pt through exercises providing occasional model prior to pt execution. occasional min-A required to achieve target dB this date. Averages this date:  Sustained "ah" 85 dB reading 76dB cognitive speech task 68 dB.  Conversational sample of approx 3 minutes, pt averages upper 60s dB with occasional mod-A.   04/14/23: SLP utilized SO today to improve speech loudness and clarity with a focus on intent and the deliberate nature of speech. SLP and pt performed Lesson 15 together today so SLP could provide feedback PRN. M-words: 81dB initially, incr'd to 85 dB after SLP model/simultaneous production to maximize loudness/intent Sustained "ah" 83dB initially, and improved with model and SLP min cues to 86dB. Glides pt averaged 82dB with SLP consistent mod cues for intent, faded to occasional min cues for intent. Counting - average 86dB with initial min cue for mindfulness/intent. Cognitive not completed due to time constraints. Pt was told to complete at home and then to cont with an online session or repeat this lesson later today. Rare intentional speech carrying over to conversation today.  04/09/23: SLP utilized SO today to improve speech loudness and clarity with a focus on intent and the deliberate nature of speech. SLP and pt performed Lesson 11 together today so SLP could provide feedback PRN. M-words: 84dB after SLP model/simultaneous production to maximize loudness/intent Sustained "ah" 86dB  With model and SLP min cues pt incr'd loudness to 87dB. Glides pt averaged 85dB with SLP consistent mod cues for intent, faded to occasional min cues for intent. Counting - average 88dB with initial min cue for mindfulness/intent. Cognitive (making words from a longer word) - average 83dB, with independence Minmal carryover to speech other than exercises, except with SLP  cues for intentional speech.  04/07/23: SLP utilized SO today to improve speech loudness and clarity with a focus on intent and the deliberate nature of speech. Pt completed Lesson 8 yesterday so SLP and pt performed Lesson 9. M-words: 84dB after SLP model/simultaneous production to maximize intent (initial - 78dB) Sustained "ah" 83dB initially; With model and SLP min cues pt incr'd loudness to 87dB. Glides pt averaged 82dB initially, with SLP cues for gliding instead of stepping. Then improved to average 83dB with occasional min cues for intent Counting - average 87dB with initial model and fading cues to occasional min A for intent and continuous voicing. Cognitive (divergent naming - average 83dB, with occasional min A for intent  Minmal carryover to speech other than exercises, except with SLP cues for intentional speech.  04/02/23: Pt has been using SO each day, but lost the link for the second lesson each day online, so only performed once/day. SLP reviewed need for intentional and purposeful speech with exercises. Told pt to repeat the lessons she rec'd last session  until next session and use the link provided to access online SO lessons once/day as well.  M-words: 85dB after SLP model/simultaneous production to maximize intent (initial - 82dB) Sustained "ah" 83 dB initially, and with model and SLP min cues pt incr'd loudness to 85dB. Glides pt averaged 83dB with softer production at end of glide downward. Counting - average 88dB with initial model  Cognitive (alphabet) - average 87dB, and word with each letter of alphabet average 84 dB with rare min A for intent   03/30/23: SLP provided pt with CES (see above for result). SLP used SpeakOut! Program to focus on pt's intent and purpose while speaking in order to improve verbal communication. SLP lead pt through exercises with averages this date:  M-words: 85dB after model/simultaneous production to maximize intent Sustained "ah" 86 dB after  consistent mod A for intent, and usual mod A faded to rare min A for proper breath support Reading (phrases) 85 dB independently Cognitive speech task 73dB Pt to cont to work on SO on her own. Pt without overt s/sx dysphagia with 7 sips water today.  03/26/23: SLP and pt completed Lesson 1 for SO. SLP instructed pt how to complete program, she will choose to do online session for her second session each day.  SLP targeted improved vocal quality and increasing intensity through progressively difficulty speech tasks using Speak Out! program, lesson 1. ST lead pt through exercises with averages this date:  M-words: 83dB after consistent mod A for intent Sustained "ah" 84 dB after consistent mod A for intent and usual mod A faded to occasional min A for proper breath support Reading (phrases) 85 dB - rare min cue for intent Cognitive speech task 76dB (spelling opposite pairs)   03/24/23: SLP shaped pt's loud /a/, and provided education about rationale for ST, SpeakOut (SO) program components and pt must commit to 24 days of the program, and role of SLP in the rehab process  PATIENT EDUCATION: Education details: See "today's treatment" Person educated: Patient Education method: Explanation, Demonstration, Verbal cues, and Handouts Education comprehension: verbalized understanding, returned demonstration, verbal cues required, and needs further education  HOME EXERCISE PROGRAM: 10 loud /a/ BID   GOALS: Goals reviewed with patient? No  SHORT TERM GOALS: Target date: 04/24/23   Pt will demo rotary chewing pattern with POs during session x2 Baseline: Goal status: INITIAL   2.  Pt will complete vocal warm up tasks (M-words, ah, glides, counting) with rare min A in 2 sessions Baseline: 04/23/23 Goal status: Partially met   3.  Pt will improve speech volume in 5 minutes simple conversation to average 73 dB in 3 sessions Baseline:  Goal status: Not met   4.  Pt will demonstrate completion  of SO home exercises as prescribed 90% compliance in first 2 weeks Baseline:  Goal status: met  5.  Pt will complete effortful swallow with independence in 2 sessions Baseline: 04/23/23 Goal status: Partially met     LONG TERM GOALS: Target date: 1011/24   1.Pt will complete vocal warm up tasks (M-words, ah, glides, counting) with modified independence in 3 sessions Baseline:  Goal status: INITIAL   2.  Pt will improve speech volume in 10 minutes simple-mod complex conversation to low 70s dB in 3 sessions Baseline:  Goal status: INITIAL   3.  In last 1-2 sessions, PROM scores will improve compared to initial administration Baseline:  Goal status: INITIAL   4.  Pt will tell SLP what HEP to perform  in order to maintain WNL volume speech over time Baseline:  Goal status: INITIAL  ASSESSMENT:  CLINICAL IMPRESSION: "Ashley Neal" is a 77 y.o. F who was seen today for treatment of speech/voice in light of PD. She has been completing SO lessons routinely, as directed. SLP cont to recommended effortful swallow exercise x20/day until mid-October, then x3/week after that. Deamber told SLP she continues to get 20-25/day in consistently. SLP will cont to monitor swallow function and accuracy of effortful swallow exercise during ST sessions, and MBS may be necessary during this therapy course. With her speech, pt has noted that "The volume gets really low and the ends of my sentences are especially low at times." Pt has agreed to perform SO therapy with SLP for 24 days and a ST therapy course of 17 possible visits.   OBJECTIVE IMPAIRMENTS: Objective impairments include dysarthria, voice disorder, and dysphagia. These impairments are limiting patient from household responsibilities, ADLs/IADLs, effectively communicating at home and in community, and safety when swallowing.Factors affecting potential to achieve goals and functional outcome are  none noted today . Patient will benefit from skilled SLP  services to address above impairments and improve overall function.  REHAB POTENTIAL: Good  PLAN:  SLP FREQUENCY: 2x/week  SLP DURATION: 8 weeks  PLANNED INTERVENTIONS: Aspiration precaution training, Pharyngeal strengthening exercises, Diet toleration management , Environmental controls, Trials of upgraded texture/liquids, Functional tasks, SLP instruction and feedback, Compensatory strategies, Patient/family education, and possible MBS    Ryden Wainer, CCC-SLP 04/23/2023, 5:32 PM  .swallow

## 2023-04-24 DIAGNOSIS — R293 Abnormal posture: Secondary | ICD-10-CM | POA: Diagnosis not present

## 2023-04-24 DIAGNOSIS — M2569 Stiffness of other specified joint, not elsewhere classified: Secondary | ICD-10-CM | POA: Diagnosis not present

## 2023-04-24 DIAGNOSIS — M6281 Muscle weakness (generalized): Secondary | ICD-10-CM | POA: Diagnosis not present

## 2023-04-24 DIAGNOSIS — M5451 Vertebrogenic low back pain: Secondary | ICD-10-CM | POA: Diagnosis not present

## 2023-04-28 ENCOUNTER — Ambulatory Visit: Payer: PPO

## 2023-04-29 DIAGNOSIS — M5451 Vertebrogenic low back pain: Secondary | ICD-10-CM | POA: Diagnosis not present

## 2023-04-29 DIAGNOSIS — M6281 Muscle weakness (generalized): Secondary | ICD-10-CM | POA: Diagnosis not present

## 2023-04-29 DIAGNOSIS — M2569 Stiffness of other specified joint, not elsewhere classified: Secondary | ICD-10-CM | POA: Diagnosis not present

## 2023-04-29 DIAGNOSIS — R293 Abnormal posture: Secondary | ICD-10-CM | POA: Diagnosis not present

## 2023-04-30 ENCOUNTER — Ambulatory Visit: Payer: PPO

## 2023-04-30 DIAGNOSIS — R471 Dysarthria and anarthria: Secondary | ICD-10-CM | POA: Diagnosis not present

## 2023-04-30 DIAGNOSIS — R1310 Dysphagia, unspecified: Secondary | ICD-10-CM

## 2023-04-30 NOTE — Therapy (Signed)
OUTPATIENT SPEECH LANGUAGE PATHOLOGY PARKINSON'S TREATMENT   Patient Name: Ashley Neal MRN: [id number] DOB:1946-07-26, 77 y.o., female Today's Date: 04/30/2023  PCP: Abbey Chatters NP REFERRING PROVIDER: Marzetta Board, MD  END OF SESSION:  End of Session - 04/30/23 1741     Visit Number 10    Number of Visits 17    Date for SLP Re-Evaluation 05/22/23    SLP Start Time 1535    SLP Stop Time  1615    SLP Time Calculation (min) 40 min    Activity Tolerance Patient tolerated treatment well                 Past Medical History:  Diagnosis Date   Anxiety    Basal cell carcinoma    Depression    Hypertension    Neuromuscular disorder (HCC)    Osteoarthritis    Osteopenia    Parkinson disease    Pneumonia    Tremors of nervous system    legs   uterine ca 10/08/2021   also endometrial cancer   Past Surgical History:  Procedure Laterality Date   CARPAL TUNNEL RELEASE Bilateral 1996/1997   CYSTOSCOPY N/A 10/08/2021   Procedure: CYSTOSCOPY;  Surgeon: Carver Fila, MD;  Location: [facility];  Service: Gynecology;  Laterality: N/A;   ROBOTIC ASSISTED TOTAL HYSTERECTOMY WITH BILATERAL SALPINGO OOPHERECTOMY N/A 10/08/2021   Procedure: XI ROBOTIC ASSISTED TOTAL HYSTERECTOMY WITH BILATERAL SALPINGO OOPHORECTOMY;SENTINEL LYMPH NODE INJECTION;  Surgeon: Carver Fila, MD;  Location: [facility];  Service: Gynecology;  Laterality: N/A;   SQUAMOUS CELL CARCINOMA EXCISION Left 2017   Left jawline   TUBAL LIGATION  1980's   Patient Active Problem List   Diagnosis Date Noted   Endometrial cancer (HCC) 10/16/2021   Complex atypical endometrial hyperplasia 09/27/2021   Primary osteoarthritis of both hands 05/14/2020   Primary osteoarthritis of both feet 05/14/2020   Age-related osteoporosis without current pathological fracture 05/14/2020   Prediabetes 05/14/2020   Essential hypertension 05/14/2020   History of hyperlipidemia 05/14/2020   History of squamous cell  carcinoma 05/14/2020   Parkinsonism 05/14/2020    ONSET DATE: approx 2-3 months ago  REFERRING DIAG: G20.A1 (ICD-10-CM) - Parkinson's disease without dyskinesia, without mention of fluctuations   THERAPY DIAG:  Dysarthria and anarthria  Dysphagia, unspecified type  Rationale for Evaluation and Treatment: Rehabilitation  SUBJECTIVE:   SUBJECTIVE STATEMENT: "He's getting sick of me with the may me my moe moo." Pt accompanied by: self  PERTINENT HISTORY: HTN, depression, osteoarthritis, chronic back pain   PAIN:  Are you having pain?   Yes: NPRS scale: 3/10  Pain location: lower back pain Pain description: sore, throbbing at times Aggravating factors: sitting Relieving factors: medication, massage  FALLS: Has patient fallen in last 6 months?  See PT evaluation for details  LIVING ENVIRONMENT: Lives with: lives alone Lives in: House/apartment  PLOF:  Level of assistance: Independent with ADLs, Independent with IADLs Employment: Retired  PATIENT GOALS: Voice improvement  OBJECTIVE:    PATIENT REPORTED OUTCOME MEASURES (PROM): Communication Effectiveness Survey: provided 03/30/23  and pt scored herself 21/32 (lower scores indicate less effective communication). "1" ("not effective") with speaking to a friend when upset, and "4" ("very effective") having a conversation with family at home, and being part of a conversation in a noisy situation.  TODAY'S TREATMENT:  DATE:   SO = SPEAKOUT!   04/30/23: Pt has been doing SO once/day since last session, twice she went online and did sessions.  She plans on doing this about half the time and other half using a lesson in the SO book. Today pt completed SO tasks from Lesson 24: M-words: average 88 dB  Sustained "ah" 85 dB average Glides: 85dB average with initial mod occasional faded to rare min  cues for intent. SLP and pt had 10 minute conversation inside and pt maintained 70dB average. Conversation for 14 minutes outdoors resulted in pt maintaining intelligibility and loudness WNL. Pt expressed satisfaction at her progress and stated she feels she has a much stronger voice at this time.  04/23/23: SLP targeted improving vocal quality and increasing intensity through progressively difficulty speech tasks using SO program, lesson 24. ST lead pt through exercises providing occasional model prior to pt execution. Averages this date:  M-words: average 86 dB  Sustained "ah" 85 dB average Glides: 82dB average reading 76dB cognitive speech task 73 dB average Conversational sample of approx 5 minutes, pt averages upper 60s dB with occasional mod-A. SLP to cont to work with pt for louder speech. Pt states people do not typically have issues understanding or hearing her the last 3 weeks.   04/16/23: SLP targeted improving vocal quality and increasing intensity through progressively difficulty speech tasks using Speak Out! program, lesson 16. ST lead pt through exercises providing occasional model prior to pt execution. occasional min-A required to achieve target dB this date. Averages this date:  Sustained "ah" 85 dB reading 76dB cognitive speech task 68 dB.  Conversational sample of approx 3 minutes, pt averages upper 60s dB with occasional mod-A.   04/14/23: SLP utilized SO today to improve speech loudness and clarity with a focus on intent and the deliberate nature of speech. SLP and pt performed Lesson 15 together today so SLP could provide feedback PRN. M-words: 81dB initially, incr'd to 85 dB after SLP model/simultaneous production to maximize loudness/intent Sustained "ah" 83dB initially, and improved with model and SLP min cues to 86dB. Glides pt averaged 82dB with SLP consistent mod cues for intent, faded to occasional min cues for intent. Counting - average 86dB with initial min cue for  mindfulness/intent. Cognitive not completed due to time constraints. Pt was told to complete at home and then to cont with an online session or repeat this lesson later today. Rare intentional speech carrying over to conversation today.  04/09/23: SLP utilized SO today to improve speech loudness and clarity with a focus on intent and the deliberate nature of speech. SLP and pt performed Lesson 11 together today so SLP could provide feedback PRN. M-words: 84dB after SLP model/simultaneous production to maximize loudness/intent Sustained "ah" 86dB  With model and SLP min cues pt incr'd loudness to 87dB. Glides pt averaged 85dB with SLP consistent mod cues for intent, faded to occasional min cues for intent. Counting - average 88dB with initial min cue for mindfulness/intent. Cognitive (making words from a longer word) - average 83dB, with independence Minmal carryover to speech other than exercises, except with SLP cues for intentional speech.  04/07/23: SLP utilized SO today to improve speech loudness and clarity with a focus on intent and the deliberate nature of speech. Pt completed Lesson 8 yesterday so SLP and pt performed Lesson 9. M-words: 84dB after SLP model/simultaneous production to maximize intent (initial - 78dB) Sustained "ah" 83dB initially; With model and SLP min cues pt incr'd loudness to  87dB. Glides pt averaged 82dB initially, with SLP cues for gliding instead of stepping. Then improved to average 83dB with occasional min cues for intent Counting - average 87dB with initial model and fading cues to occasional min A for intent and continuous voicing. Cognitive (divergent naming - average 83dB, with occasional min A for intent  Minmal carryover to speech other than exercises, except with SLP cues for intentional speech.  04/02/23: Pt has been using SO each day, but lost the link for the second lesson each day online, so only performed once/day. SLP reviewed need for intentional and  purposeful speech with exercises. Told pt to repeat the lessons she rec'd last session until next session and use the link provided to access online SO lessons once/day as well.  M-words: 85dB after SLP model/simultaneous production to maximize intent (initial - 82dB) Sustained "ah" 83 dB initially, and with model and SLP min cues pt incr'd loudness to 85dB. Glides pt averaged 83dB with softer production at end of glide downward. Counting - average 88dB with initial model  Cognitive (alphabet) - average 87dB, and word with each letter of alphabet average 84 dB with rare min A for intent   03/30/23: SLP provided pt with CES (see above for result). SLP used SpeakOut! Program to focus on pt's intent and purpose while speaking in order to improve verbal communication. SLP lead pt through exercises with averages this date:  M-words: 85dB after model/simultaneous production to maximize intent Sustained "ah" 86 dB after consistent mod A for intent, and usual mod A faded to rare min A for proper breath support Reading (phrases) 85 dB independently Cognitive speech task 73dB Pt to cont to work on SO on her own. Pt without overt s/sx dysphagia with 7 sips water today.  03/26/23: SLP and pt completed Lesson 1 for SO. SLP instructed pt how to complete program, she will choose to do online session for her second session each day.  SLP targeted improved vocal quality and increasing intensity through progressively difficulty speech tasks using Speak Out! program, lesson 1. ST lead pt through exercises with averages this date:  M-words: 83dB after consistent mod A for intent Sustained "ah" 84 dB after consistent mod A for intent and usual mod A faded to occasional min A for proper breath support Reading (phrases) 85 dB - rare min cue for intent Cognitive speech task 76dB (spelling opposite pairs)   03/24/23: SLP shaped pt's loud /a/, and provided education about rationale for ST, SpeakOut (SO) program components  and pt must commit to 24 days of the program, and role of SLP in the rehab process  PATIENT EDUCATION: Education details: See "today's treatment" Person educated: Patient Education method: Explanation, Demonstration, Verbal cues, and Handouts Education comprehension: verbalized understanding, returned demonstration, verbal cues required, and needs further education  HOME EXERCISE PROGRAM: 10 loud /a/ BID   GOALS: Goals reviewed with patient? No  SHORT TERM GOALS: Target date: 04/24/23   Pt will demo rotary chewing pattern with POs during session x2 Baseline: Goal status: INITIAL   2.  Pt will complete vocal warm up tasks (M-words, ah, glides, counting) with rare min A in 2 sessions Baseline: 04/23/23 Goal status: Partially met   3.  Pt will improve speech volume in 5 minutes simple conversation to average 73 dB in 3 sessions Baseline:  Goal status: Not met   4.  Pt will demonstrate completion of SO home exercises as prescribed 90% compliance in first 2 weeks Baseline:  Goal  status: met  5.  Pt will complete effortful swallow with independence in 2 sessions Baseline: 04/23/23 Goal status: Partially met     LONG TERM GOALS: Target date: 1011/24   1.Pt will complete vocal warm up tasks (M-words, ah, glides, counting) with modified independence in 3 sessions Baseline: 04/30/23 Goal status: INITIAL   2.  Pt will improve speech volume in 10 minutes simple-mod complex conversation to low 70s dB in 3 sessions Baseline: 04/30/23 Goal status: INITIAL   3.  In last 1-2 sessions, PROM scores will improve compared to initial administration Baseline:  Goal status: INITIAL   4.  Pt will tell SLP what HEP to perform in order to maintain WNL volume speech over time Baseline: 04/30/23 Goal status: Met  ASSESSMENT:  CLINICAL IMPRESSION: "Meenu" is a 77 y.o. F who was seen today for treatment of speech/voice in light of PD. She has been completing SO lessons x1/day since doing  Lesson 24. SLP cont to recommended effortful swallow exercise x20/day until mid-October, then x3/week after that. Delana told SLP she continues to get 20-25/day in consistently. SLP will cont to monitor swallow function and accuracy of effortful swallow exercise during ST sessions, and MBS may be necessary during this therapy course. With her speech, pt has noted that "The volume gets really low and the ends of my sentences are especially low at times." Pt has agreed to perform SO therapy with SLP for 24 days and a ST therapy course of 17 possible visits.   OBJECTIVE IMPAIRMENTS: Objective impairments include dysarthria, voice disorder, and dysphagia. These impairments are limiting patient from household responsibilities, ADLs/IADLs, effectively communicating at home and in community, and safety when swallowing.Factors affecting potential to achieve goals and functional outcome are  none noted today . Patient will benefit from skilled SLP services to address above impairments and improve overall function.  REHAB POTENTIAL: Good  PLAN:  SLP FREQUENCY: 2x/week  SLP DURATION: 8 weeks  PLANNED INTERVENTIONS: Aspiration precaution training, Pharyngeal strengthening exercises, Diet toleration management , Environmental controls, Trials of upgraded texture/liquids, Functional tasks, SLP instruction and feedback, Compensatory strategies, Patient/family education, and possible MBS    Teri Diltz, CCC-SLP 04/30/2023, 5:42 PM  .swallow

## 2023-05-01 DIAGNOSIS — R293 Abnormal posture: Secondary | ICD-10-CM | POA: Diagnosis not present

## 2023-05-01 DIAGNOSIS — M6281 Muscle weakness (generalized): Secondary | ICD-10-CM | POA: Diagnosis not present

## 2023-05-01 DIAGNOSIS — M2569 Stiffness of other specified joint, not elsewhere classified: Secondary | ICD-10-CM | POA: Diagnosis not present

## 2023-05-01 DIAGNOSIS — M5451 Vertebrogenic low back pain: Secondary | ICD-10-CM | POA: Diagnosis not present

## 2023-05-04 ENCOUNTER — Ambulatory Visit (INDEPENDENT_AMBULATORY_CARE_PROVIDER_SITE_OTHER): Payer: PPO | Admitting: Nurse Practitioner

## 2023-05-04 ENCOUNTER — Encounter: Payer: Self-pay | Admitting: Nurse Practitioner

## 2023-05-04 VITALS — BP 124/72 | HR 84 | Temp 97.9°F | Ht 61.0 in | Wt 155.8 lb

## 2023-05-04 DIAGNOSIS — G20A1 Parkinson's disease without dyskinesia, without mention of fluctuations: Secondary | ICD-10-CM | POA: Diagnosis not present

## 2023-05-04 DIAGNOSIS — Z23 Encounter for immunization: Secondary | ICD-10-CM

## 2023-05-04 DIAGNOSIS — Z0000 Encounter for general adult medical examination without abnormal findings: Secondary | ICD-10-CM

## 2023-05-04 NOTE — Progress Notes (Signed)
Subjective:   Ashley Neal is a 77 y.o. female who presents for Medicare Annual (Subsequent) preventive examination.  Visit Complete: In person   Cardiac Risk Factors include: advanced age (>62men, >94 women);hypertension;sedentary lifestyle     Objective:    Today's Vitals   05/04/23 1001 05/04/23 1020  BP: 124/72   Pulse: 84   Temp: 97.9 F (36.6 C)   TempSrc: Temporal   SpO2: 96%   Weight: 155 lb 12.8 oz (70.7 kg)   Height: 5\' 1"  (1.549 m)   PainSc:  3    Body mass index is 29.44 kg/m.     05/04/2023    9:07 AM 03/03/2023    3:43 PM 12/18/2022    2:28 PM 12/04/2022    3:31 PM 02/25/2022    3:17 PM 01/31/2022    7:44 PM 11/29/2021   11:16 AM  Advanced Directives  Does Patient Have a Medical Advance Directive? Yes Yes Yes Yes Yes No Yes  Type of Advance Directive Living will;Out of facility DNR (pink MOST or yellow form) Healthcare Power of [place];Living will Healthcare Power of [place];Living will Healthcare Power of [place];Living will     Does patient want to make changes to medical advance directive? No - Patient declined  No - Patient declined No - Patient declined   No - Patient declined  Copy of Healthcare Power of Attorney in Chart?  No - copy requested Yes - validated most recent copy scanned in chart (See row information) Yes - validated most recent copy scanned in chart (See row information)     Would patient like information on creating a medical advance directive?     No - Patient declined No - Patient declined   Pre-existing out of facility DNR order (yellow form or pink MOST form) Yellow form placed in chart (order not valid for inpatient use)          Current Medications (verified) Outpatient Encounter Medications as of 05/04/2023  Medication Sig   Acetaminophen (TYLENOL PO) Take 1 tablet by mouth as needed.   carbidopa-levodopa (SINEMET IR) 25-100 MG tablet Take 2 tablets by mouth 5 (five) times daily.   escitalopram (LEXAPRO) 5 MG tablet Take 5  mg by mouth daily.   lisinopril (ZESTRIL) 10 MG tablet Take 10 mg by mouth at bedtime as needed (Take if blood pressure > 150/90 at bedtime.).   pregabalin (LYRICA) 75 MG capsule Take 1 capsule (75 mg total) by mouth 2 (two) times daily.   [DISCONTINUED] bisacodyl (DULCOLAX) 5 MG EC tablet Take 5 mg by mouth as needed. (Patient not taking: Reported on 05/04/2023)   [DISCONTINUED] polyethylene glycol powder (GLYCOLAX/MIRALAX) 17 GM/SCOOP powder Take 17 g by mouth as needed. (Patient not taking: Reported on 05/04/2023)   No facility-administered encounter medications on file as of 05/04/2023.    Allergies (verified) Codeine   History: Past Medical History:  Diagnosis Date   Anxiety    Basal cell carcinoma    Depression    Hypertension    Neuromuscular disorder (HCC)    Osteoarthritis    Osteopenia    Parkinson disease    Pneumonia    Tremors of nervous system    legs   uterine ca 10/08/2021   also endometrial cancer   Past Surgical History:  Procedure Laterality Date   CARPAL TUNNEL RELEASE Bilateral 1996/1997   CYSTOSCOPY N/A 10/08/2021   Procedure: CYSTOSCOPY;  Surgeon: Carver Fila, MD;  Location: [facility];  Service: Gynecology;  Laterality: N/A;  ROBOTIC ASSISTED TOTAL HYSTERECTOMY WITH BILATERAL SALPINGO OOPHERECTOMY N/A 10/08/2021   Procedure: XI ROBOTIC ASSISTED TOTAL HYSTERECTOMY WITH BILATERAL SALPINGO OOPHORECTOMY;SENTINEL LYMPH NODE INJECTION;  Surgeon: Carver Fila, MD;  Location: [facility];  Service: Gynecology;  Laterality: N/A;   SQUAMOUS CELL CARCINOMA EXCISION Left 2017   Left jawline   TUBAL LIGATION  1980's   Family History  Problem Relation Age of Onset   Endometrial cancer Mother    Diabetes Mother    Alzheimer's disease Father    Coronary artery disease Father    Diabetes Sister    COPD Sister    Diabetes Sister    Heart disease Brother    Other Brother        MVA    Parkinson's disease Brother    Heart disease Brother    Alcohol abuse  Brother    Heart disease Brother    Parkinson's disease Maternal Aunt    Breast cancer Maternal Aunt    Colon cancer Paternal Aunt    Parkinson's disease Paternal Aunt    Lung cancer Paternal Uncle    Healthy Son    Healthy Son    Prostate cancer Neg Hx    Pancreatic cancer Neg Hx    Ovarian cancer Neg Hx    Social History   Socioeconomic History   Marital status: Married    Spouse name: Not on file   Number of children: Not on file   Years of education: Not on file   Highest education level: Bachelor's degree (e.g., BA, AB, BS)  Occupational History   Occupation: retired    Comment: Advertising account planner  Tobacco Use   Smoking status: Never    Passive exposure: Never   Smokeless tobacco: Never  Vaping Use   Vaping status: Never Used  Substance and Sexual Activity   Alcohol use: Yes    Comment: rarely   Drug use: Not Currently   Sexual activity: Not Currently  Other Topics Concern   Not on file  Social History Narrative   Not on file   Social Determinants of Health   Financial Resource Strain: Low Risk  (02/12/2023)   Overall Financial Resource Strain (CARDIA)    Difficulty of Paying Living Expenses: Not very hard  Food Insecurity: No Food Insecurity (02/12/2023)   Hunger Vital Sign    Worried About Running Out of Food in the Last Year: Never true    Ran Out of Food in the Last Year: Never true  Transportation Needs: No Transportation Needs (02/12/2023)   PRAPARE - Administrator, Civil Service (Medical): No    Lack of Transportation (Non-Medical): No  Physical Activity: Sufficiently Active (02/12/2023)   Exercise Vital Sign    Days of Exercise per Week: 5 days    Minutes of Exercise per Session: 40 min  Stress: Stress Concern Present (02/12/2023)   [employer] of Occupational Health - Occupational Stress Questionnaire    Feeling of Stress : To some extent  Social Connections: Unknown (02/12/2023)   Social Connection and Isolation Panel [NHANES]     Frequency of Communication with Friends and Family: More than three times a week    Frequency of Social Gatherings with Friends and Family: More than three times a week    Attends Religious Services: Not on file    Active Member of Clubs or Organizations: Yes    Attends Banker Meetings: More than 4 times per year    Marital Status: Married  Tobacco Counseling Counseling given: Not Answered   Clinical Intake:  Pre-visit preparation completed: Yes  Pain : No/denies pain Pain Score: 3      BMI - recorded: 29.44 Nutritional Status: BMI 25 -29 Overweight Nutritional Risks: None Diabetes: No  How often do you need to have someone help you when you read instructions, pamphlets, or other written materials from your doctor or pharmacy?: 1 - Never         Activities of Daily Living    05/04/2023   10:17 AM 04/30/2023    6:59 PM  In your present state of health, do you have any difficulty performing the following activities:  Hearing? 1 0  Vision? 1 0  Difficulty concentrating or making decisions? 0 0  Walking or climbing stairs? 0 1  Dressing or bathing? 1 1  Comment undressed is a problem but has help   Doing errands, shopping? 0 0  Preparing Food and eating ? N N  Using the Toilet? N N  In the past six months, have you accidently leaked urine? Y N  Do you have problems with loss of bowel control? N N  Managing your Medications? N N  Managing your Finances? N N  Housekeeping or managing your Housekeeping? N N    Patient Care Team: [name], NP as PCP - General (Geriatric Medicine) Corky Crafts, MD as PCP - Cardiology (Cardiology)  Indicate any recent Medical Services you may have received from other than [facility] providers in the past year (date may be approximate).     Assessment:   This is a routine wellness examination for Azizah.  Hearing/Vision screen Hearing Screening - Comments:: Admits to hearing issues, does not wear  hearing aids. Refused evaluation for hearing aids.  Vision Screening - Comments:: Last eye exam less than 12 months ago with Dr.[facility].    Goals Addressed   None    Depression Screen    05/04/2023   10:03 AM 11/14/2022    1:45 PM  PHQ 2/9 Scores  PHQ - 2 Score 2 0  PHQ- 9 Score 3     Fall Risk    05/04/2023   10:02 AM 04/30/2023    6:59 PM 03/16/2023    9:03 AM 12/18/2022    2:27 PM 12/04/2022    3:30 PM  Fall Risk   Falls in the past year? 1 1 1 1 1   Number falls in past yr: 0 1 0 1 1  Injury with Fall? 0 0 0 1 1  Risk for fall due to : No Fall Risks  No Fall Risks History of fall(s);Impaired balance/gait;Impaired mobility History of fall(s);Impaired balance/gait;Impaired mobility  Follow up Falls evaluation completed  Falls evaluation completed Falls evaluation completed;Education provided;Falls prevention discussed Falls evaluation completed;Education provided;Falls prevention discussed    MEDICARE RISK AT HOME: Medicare Risk at Home Any stairs in or around the home?: (P) Yes If so, are there any without handrails?: (P) Yes Home free of loose throw rugs in walkways, pet beds, electrical cords, etc?: (P) Yes Adequate lighting in your home to reduce risk of falls?: (P) Yes Life alert?: (P) No Use of a cane, walker or w/c?: (P) No Grab bars in the bathroom?: (P) Yes Shower chair or bench in shower?: (P) Yes Elevated toilet seat or a handicapped toilet?: (P) Yes  TIMED UP AND GO:  Was the test performed?  No    Cognitive Function:    05/04/2023   10:09 AM  MMSE -  Mini Mental State Exam  Orientation to time 5  Orientation to Place 5  Registration 3  Attention/ Calculation 5  Recall 3  Language- name 2 objects 2  Language- repeat 1  Language- follow 3 step command 3  Language- read & follow direction 0  Language-read & follow direction-comments Did not read sentence out loud  Write a sentence 1  Copy design 1  Total score 29        Immunizations Immunization  History  Administered Date(s) Administered   Janssen (J&J) SARS-COV-2 Vaccination 10/27/2019   Tdap 07/11/2018   Unspecified SARS-COV-2 Vaccination 10/27/2019   Zoster Recombinant(Shingrix) 08/05/2018, 11/12/2018    TDAP status: Up to date  Flu Vaccine status: Declined, Education has been provided regarding the importance of this vaccine but patient still declined. Advised may receive this vaccine at local pharmacy or Health Dept. Aware to provide a copy of the vaccination record if obtained from local pharmacy or Health Dept. Verbalized acceptance and understanding. Unsure if she has gotten pneumonia   Covid-19 vaccine status: Information provided on how to obtain vaccines.   Qualifies for Shingles Vaccine? Yes   Zostavax completed Yes   Shingrix Completed?: Yes  Screening Tests Health Maintenance  Topic Date Due   Hepatitis C Screening  Never done   Pneumonia Vaccine 50+ Years old (1 of 1 - PCV) Never done   COVID-19 Vaccine (2 - Janssen risk series) 11/24/2019   INFLUENZA VACCINE  11/09/2023 (Originally 03/12/2023)   Medicare Annual Wellness (AWV)  05/03/2024   DTaP/Tdap/Td (2 - Td or Tdap) 07/11/2028   Zoster Vaccines- Shingrix  Completed   HPV VACCINES  Aged Out   DEXA SCAN  Discontinued    Health Maintenance  Health Maintenance Due  Topic Date Due   Hepatitis C Screening  Never done   Pneumonia Vaccine 6+ Years old (1 of 1 - PCV) Never done   COVID-19 Vaccine (2 - Janssen risk series) 11/24/2019    Colorectal cancer screening: No longer required.   Mammogram status: No longer required due to age.  Denies bone density  Lung Cancer Screening: (Low Dose CT Chest recommended if Age 41-80 years, 20 pack-year currently smoking OR have quit w/in 15years.) does not qualify.   Lung Cancer Screening Referral: na  Additional Screening:  Hepatitis C Screening: does qualify; Complete with next blood work  Vision Screening: Recommended annual ophthalmology exams for  early detection of glaucoma and other disorders of the eye. Is the patient up to date with their annual eye exam?  Yes  Who is the provider or what is the name of the office in which the patient attends annual eye exams? [facility] If pt is not established with a provider, would they like to be referred to a provider to establish care? No .   Dental Screening: Recommended annual dental exams for proper oral hygiene    Community Resource Referral / Chronic Care Management: CRR required this visit?  No   CCM required this visit?  No     Plan:     I have personally reviewed and noted the following in the patient's chart:   Medical and social history Use of alcohol, tobacco or illicit drugs  Current medications and supplements including opioid prescriptions. Patient is not currently taking opioid prescriptions. Functional ability and status Nutritional status Physical activity Advanced directives List of other physicians Hospitalizations, surgeries, and ER visits in previous 12 months Vitals Screenings to include cognitive, depression, and falls Referrals and appointments  In addition, I have reviewed and discussed with patient certain preventive protocols, quality metrics, and best practice recommendations. A written personalized care plan for preventive services as well as general preventive health recommendations were provided to patient.     [name], NP   05/04/2023

## 2023-05-05 ENCOUNTER — Ambulatory Visit: Payer: PPO

## 2023-05-05 ENCOUNTER — Telehealth: Payer: Self-pay | Admitting: *Deleted

## 2023-05-05 NOTE — Progress Notes (Unsigned)
Care Coordination  Outreach Note  05/05/2023 Name: Ashley Neal MRN: [id number] DOB: January 17, 1946   Care Coordination Outreach Attempts: An unsuccessful telephone outreach was attempted today to offer the patient information about available care coordination services.  Follow Up Plan:  Additional outreach attempts will be made to offer the patient care coordination information and services.   Encounter Outcome:  No Answer  Gwenevere Ghazi  Care Coordination Care Guide  Direct Dial: [phone]

## 2023-05-06 DIAGNOSIS — R293 Abnormal posture: Secondary | ICD-10-CM | POA: Diagnosis not present

## 2023-05-06 DIAGNOSIS — M2569 Stiffness of other specified joint, not elsewhere classified: Secondary | ICD-10-CM | POA: Diagnosis not present

## 2023-05-06 DIAGNOSIS — M6281 Muscle weakness (generalized): Secondary | ICD-10-CM | POA: Diagnosis not present

## 2023-05-06 DIAGNOSIS — M5451 Vertebrogenic low back pain: Secondary | ICD-10-CM | POA: Diagnosis not present

## 2023-05-06 NOTE — Progress Notes (Unsigned)
Care Coordination  Outreach Note  05/06/2023 Name: Ashley Neal MRN: [id number] DOB: 1945/08/21   Care Coordination Outreach Attempts: A second unsuccessful outreach was attempted today to offer the patient with information about available care coordination services.  Follow Up Plan:  Additional outreach attempts will be made to offer the patient care coordination information and services.   Encounter Outcome:  No Answer  Christie Nottingham  Care Coordination Care Guide  Direct Dial: [phone]

## 2023-05-07 ENCOUNTER — Ambulatory Visit: Payer: PPO

## 2023-05-07 NOTE — Progress Notes (Signed)
Care Coordination   Note   05/07/2023 Name: Ashley Neal MRN: [id number] DOB: 1945-12-01  Ashley Neal is a 77 y.o. year old female who sees Eubanks, Janene Harvey, NP for primary care. I reached out to Ashley Neal by phone today to offer care coordination services.  [name] was given information about Care Coordination services today including:   The Care Coordination services include support from the care team which includes your Nurse Coordinator, Clinical Social Worker, or Pharmacist.  The Care Coordination team is here to help remove barriers to the health concerns and goals most important to you. Care Coordination services are voluntary, and the patient may decline or stop services at any time by request to their care team member.   Care Coordination Consent Status: Patient agreed to services and verbal consent obtained.   Follow up plan:  Telephone appointment with care coordination team member scheduled for:  05/08/23  Encounter Outcome:  Patient Scheduled [facility] Coordination Care Guide  Direct Dial: [phone]

## 2023-05-08 ENCOUNTER — Ambulatory Visit: Payer: Self-pay | Admitting: *Deleted

## 2023-05-08 ENCOUNTER — Encounter: Payer: Self-pay | Admitting: *Deleted

## 2023-05-08 DIAGNOSIS — M5451 Vertebrogenic low back pain: Secondary | ICD-10-CM | POA: Diagnosis not present

## 2023-05-08 DIAGNOSIS — M6281 Muscle weakness (generalized): Secondary | ICD-10-CM | POA: Diagnosis not present

## 2023-05-08 DIAGNOSIS — R293 Abnormal posture: Secondary | ICD-10-CM | POA: Diagnosis not present

## 2023-05-08 DIAGNOSIS — M2569 Stiffness of other specified joint, not elsewhere classified: Secondary | ICD-10-CM | POA: Diagnosis not present

## 2023-05-08 NOTE — Patient Instructions (Signed)
Visit Information  Thank you for taking time to visit with me today. Please don't hesitate to contact me if I can be of assistance to you.   Following are the goals we discussed today:   Goals Addressed               This Visit's Progress     Receive Counseling & Supportive Services to Reduce & Manage Symptoms of Anxiety & Depression. (pt-stated)   On track     Care Coordination Interventions:  Interventions Today    Flowsheet Row Most Recent Value  Chronic Disease   Chronic disease during today's visit Hypertension (HTN), Other  [Parkinsonism, Bilateral Osteoarthritis of Hands & Feet, Endometrial Cancer, Prediabetes, Family Discord, Anxiety, & Depression]  General Interventions   General Interventions Discussed/Reviewed General Interventions Discussed, Labs, Vaccines, Doctor Visits, Referral to Nurse, Communication with, Annual Foot Exam, Health Screening, Lipid Profile, General Interventions Reviewed, Durable Medical Equipment (DME), Annual Eye Exam, Community Resources, Level of Care  [Encouraged]  Labs Hgb A1c every 3 months, Kidney Function  [Encouraged]  Vaccines COVID-19, Flu, Pneumonia, RSV, Shingles, Tetanus/Pertussis/Diphtheria  [Encouraged]  Doctor Visits Discussed/Reviewed Doctor Visits Discussed, Specialist, Doctor Visits Reviewed, Annual Wellness Visits, PCP  [Encouraged]  Health Screening Bone Density, Colonoscopy, Mammogram  [Encouraged]  Durable Medical Equipment (DME) Other  [Eyeglasses]  PCP/Specialist Visits Compliance with follow-up visit  [Encouraged]  Communication with PCP/Specialists, RN, Pharmacists, Social Work  [Encouraged]  Level of Care Adult Daycare, Development worker, international aid, Air traffic controller, Assisted Living, Skilled Nursing Facility  [Encouraged]  Applications Medicaid, Personal Care Services, FL-2  [Encouraged]  Exercise Interventions   Exercise Discussed/Reviewed Exercise Discussed, Assistive device use and maintanence, [employer] Managment, Physical  Activity, Exercise Reviewed  [Encouraged]  Physical Activity Discussed/Reviewed Physical Activity Discussed, Home Exercise Program (HEP), PREP, Physical Activity Reviewed, Gym, Types of exercise  [Encouraged]  Weight Management Weight loss  [Encouraged]  Education Interventions   Education Provided Provided Therapist, sports, Provided Web-based Education, Provided Education  [Encouraged]  Provided Engineer, petroleum On Nutrition, Foot Care, Mental Health/Coping with Illness, When to see the doctor, Sick Day Rules, [employer], Development worker, community, Medication, Exercise, Applications, [employer], Labs, Blood Sugar Monitoring  [Encouraged]  Labs Reviewed --  [N/A]  Ship broker, Personal Care Services, FL-2  [Encouraged]  Mental Health Interventions   Mental Health Discussed/Reviewed Mental Health Discussed, Anxiety, Depression, Grief and Loss, Mental Health Reviewed, Substance Abuse, Coping Strategies, Suicide, Other, Crisis  [Domestic Violence]  Nutrition Interventions   Nutrition Discussed/Reviewed Nutrition Discussed, Adding fruits and vegetables, Increasing proteins, Decreasing fats, Decreasing salt, Decreasing sugar intake, Carbohydrate meal planning, Portion sizes, Fluid intake, Nutrition Reviewed  [Encouraged]  Pharmacy Interventions   Pharmacy Dicussed/Reviewed Pharmacy Topics Discussed, Medications and their functions, Medication Adherence, Pharmacy Topics Reviewed, Affording Medications  [Encouraged]  Medication Adherence --  [N/A]  Safety Interventions   Safety Discussed/Reviewed Safety Discussed, Safety Reviewed, Fall Risk  [Encouraged]  Advanced Directive Interventions   Advanced Directives Discussed/Reviewed Advanced Directives Discussed  [Completed]       Assessed Social Determinant of Health Barriers. Discussed Plans for Ongoing Care Management Follow Up. Provided Careers information officer Information for Care Management Team Members. Screened for Signs & Symptoms of Depression,  Related to Chronic Disease State.  PHQ2 & PHQ9 Depression Screen Completed & Results Reviewed.  Suicidal Ideation & Homicidal Ideation Assessed - None Present.   Domestic Violence Assessed - None Present. Access to Weapons Assessed - None Present.   Active Listening & Reflection Utilized.  Verbalization of Feelings Encouraged.  Emotional Support Provided. Symptoms of Chronic Pain Acknowledged. Chronic Pain Support Groups Reviewed & Self-Enrollment Encouraged. Crisis Support Information, Agencies, Services, & Resources Discussed. Problem Solving Interventions Identified. Task-Centered Solutions Implemented.   Solution-Focused Strategies Developed. Acceptance & Commitment Therapy Introduced. Brief Cognitive Behavioral Therapy Initiated. Client-Centered Therapy Enacted. Reviewed Prescription Medications & Discussed Importance of Compliance. Quality of Sleep Assessed & Sleep Hygiene Techniques Promoted. Encouraged Review of [employer], [employer], & Resources in [place], Offering Psychiatric & [employer], Emailed on 05/08/2023.  Encouraged Daily Implementation of Deep Breathing Exercises, Relaxation Techniques, & Mindfulness Meditation Strategies, in An Effort to Reduce & Manage Symptoms of Anxiety, Depression, & Grief. Encouraged Self-Enrollment with Psychiatrist of Interest in [facility], from List Provided, to Receive Psychotropic Medication Administration & Management, in An Effort to Reduce & Manage Symptoms of Anxiety, Depression, & Grief. Encouraged Self-Enrollment with Therapist of Interest in [facility], from List Provided, to Receive Psychotherapeutic Counseling & Supportive Services, in An Effort to Reduce & Manage Symptoms of Anxiety, Depression, & Grief.  Encouraged Routine Engagement in Activities of Interest, Inside & Outside the Home. Encouraged Increased Level of Activity & Exercise, as Tolerated. Encouraged Weekly Attendance at [facility] [phone]), to Engage in Trivia & Various Other Social Activities. Encouraged Weekly Attendance at [facility] [phone]), to Participate in Yoga Classes.  Encouraged Administration of Medications, Exactly as Prescribed. Encouraged Contact with CSW (# [phone]), if You Have Questions, Need Assistance, or If Additional Social Work Needs Are Identified Between Now & Our Next Follow-Up Outreach Call. Encouraged Attendance at Follow-Up Appointment with Warner Mccreedy, Nurse Practitioner with [facility] [place] [phone]), Scheduled on 06/19/2023 at 1:10 PM. Encouraged Attendance at Follow-Up Appointment with Abbey Chatters, Nurse Practitioner with [facility] [phone]), Scheduled on 08/24/2023 at 10:40 AM.      Please call the care guide team at [phone] if you need to cancel or reschedule your appointment.   If you are experiencing a Mental Health or Behavioral Health Crisis or need someone to talk to, please call the Suicide and Crisis Lifeline: 988 call the [place] National Suicide Prevention Lifeline: 814-273-2082 or TTY: 3850117895 TTY 671-111-9357) to talk to a trained counselor call 1-800-273-TALK (toll free, 24 hour hotline) go to [facility] [address], [place] [phone]) call the [facility] Crisis Line: [phone] call 911  Patient verbalizes understanding of instructions and care plan provided today and agrees to view in MyChart. Active MyChart status and patient understanding of how to access instructions and care plan via MyChart confirmed with patient.     You Will Be Receiving a Follow-Up Outreach Call from [place], [place].   Danford Bad, BSW, MSW, Printmaker Social Work Case Set designer Health  [facility], Population Health Direct Dial: [phone]   Fax: [phone] Email: [email or web address] Website: [email or web address]

## 2023-05-08 NOTE — Patient Outreach (Signed)
Care Coordination   Initial Visit Note   05/08/2023  Name: Ashley Neal MRN: [id number] DOB: 13-Nov-1945  Ashley Neal is a 77 y.o. year old female who sees Eubanks, Janene Harvey, NP for primary care. I spoke with Ashley Neal by phone today.  What matters to the patients health and wellness today?  Receive Counseling & Supportive Services to Reduce & Manage Symptoms of Anxiety & Depression.    Goals Addressed               This Visit's Progress     Receive Counseling & Supportive Services to Reduce & Manage Symptoms of Anxiety & Depression. (pt-stated)   On track     Care Coordination Interventions:  Interventions Today    Flowsheet Row Most Recent Value  Chronic Disease   Chronic disease during today's visit Hypertension (HTN), Other  [Parkinsonism, Bilateral Osteoarthritis of Hands & Feet, Endometrial Cancer, Prediabetes, Family Discord, Anxiety, & Depression]  General Interventions   General Interventions Discussed/Reviewed General Interventions Discussed, Labs, Vaccines, Doctor Visits, Referral to Nurse, Communication with, Annual Foot Exam, Health Screening, Lipid Profile, General Interventions Reviewed, Durable Medical Equipment (DME), Annual Eye Exam, Community Resources, Level of Care  [Encouraged]  Labs Hgb A1c every 3 months, Kidney Function  [Encouraged]  Vaccines COVID-19, Flu, Pneumonia, RSV, Shingles, Tetanus/Pertussis/Diphtheria  [Encouraged]  Doctor Visits Discussed/Reviewed Doctor Visits Discussed, Specialist, Doctor Visits Reviewed, Annual Wellness Visits, PCP  [Encouraged]  Health Screening Bone Density, Colonoscopy, Mammogram  [Encouraged]  Durable Medical Equipment (DME) Other  [Eyeglasses]  PCP/Specialist Visits Compliance with follow-up visit  [Encouraged]  Communication with PCP/Specialists, RN, Pharmacists, Social Work  [Encouraged]  Level of Care Adult Daycare, Development worker, international aid, Air traffic controller, Assisted Living, Skilled Nursing Facility   [Encouraged]  Applications Medicaid, Personal Care Services, FL-2  [Encouraged]  Exercise Interventions   Exercise Discussed/Reviewed Exercise Discussed, Assistive device use and maintanence, [employer] Managment, Physical Activity, Exercise Reviewed  [Encouraged]  Physical Activity Discussed/Reviewed Physical Activity Discussed, Home Exercise Program (HEP), PREP, Physical Activity Reviewed, Gym, Types of exercise  [Encouraged]  Weight Management Weight loss  [Encouraged]  Education Interventions   Education Provided Provided Therapist, sports, Provided Web-based Education, Provided Education  [Encouraged]  Provided Engineer, petroleum On Nutrition, Foot Care, Mental Health/Coping with Illness, When to see the doctor, Sick Day Rules, [facility], Development worker, community, Medication, Exercise, Applications, [employer], Labs, Blood Sugar Monitoring  [Encouraged]  Labs Reviewed --  [N/A]  Ship broker, Personal Care Services, FL-2  [Encouraged]  Mental Health Interventions   Mental Health Discussed/Reviewed Mental Health Discussed, Anxiety, Depression, Grief and Loss, Mental Health Reviewed, Substance Abuse, Coping Strategies, Suicide, Other, Crisis  [Domestic Violence]  Nutrition Interventions   Nutrition Discussed/Reviewed Nutrition Discussed, Adding fruits and vegetables, Increasing proteins, Decreasing fats, Decreasing salt, Decreasing sugar intake, Carbohydrate meal planning, Portion sizes, Fluid intake, Nutrition Reviewed  [Encouraged]  Pharmacy Interventions   Pharmacy Dicussed/Reviewed Pharmacy Topics Discussed, Medications and their functions, Medication Adherence, Pharmacy Topics Reviewed, Affording Medications  [Encouraged]  Medication Adherence --  [N/A]  Safety Interventions   Safety Discussed/Reviewed Safety Discussed, Safety Reviewed, Fall Risk  [Encouraged]  Advanced Directive Interventions   Advanced Directives Discussed/Reviewed Advanced Directives Discussed  [Completed]        Assessed Social Determinant of Health Barriers. Discussed Plans for Ongoing Care Management Follow Up. Provided Careers information officer Information for Care Management Team Members. Screened for Signs & Symptoms of Depression, Related to Chronic Disease State.  PHQ2 & PHQ9 Depression Screen Completed & Results Reviewed.  Suicidal Ideation & Homicidal Ideation Assessed - None Present.   Domestic Violence Assessed - None Present. Access to Weapons Assessed - None Present.   Active Listening & Reflection Utilized.  Verbalization of Feelings Encouraged.  Emotional Support Provided. Symptoms of Chronic Pain Acknowledged. Chronic Pain Support Groups Reviewed & Self-Enrollment Encouraged. Crisis Support Information, Agencies, Services, & Resources Discussed. Problem Solving Interventions Identified. Task-Centered Solutions Implemented.   Solution-Focused Strategies Developed. Acceptance & Commitment Therapy Introduced. Brief Cognitive Behavioral Therapy Initiated. Client-Centered Therapy Enacted. Reviewed Prescription Medications & Discussed Importance of Compliance. Quality of Sleep Assessed & Sleep Hygiene Techniques Promoted. Encouraged Review of [employer], [employer], & Resources in [place], Offering Psychiatric & [employer], Emailed on 05/08/2023.  Encouraged Daily Implementation of Deep Breathing Exercises, Relaxation Techniques, & Mindfulness Meditation Strategies, in An Effort to Reduce & Manage Symptoms of Anxiety, Depression, & Grief. Encouraged Self-Enrollment with Psychiatrist of Interest in [facility], from List Provided, to Receive Psychotropic Medication Administration & Management, in An Effort to Reduce & Manage Symptoms of Anxiety, Depression, & Grief. Encouraged Self-Enrollment with Therapist of Interest in [facility], from List Provided, to Receive Psychotherapeutic Counseling & Supportive Services, in An Effort to Reduce & Manage Symptoms of Anxiety,  Depression, & Grief.  Encouraged Routine Engagement in Activities of Interest, Inside & Outside the Home. Encouraged Increased Level of Activity & Exercise, as Tolerated. Encouraged Weekly Attendance at [facility] [phone]), to Engage in [employer] & Various Other Social Activities. Encouraged Weekly Attendance at [facility] [phone]), to Participate in Yoga Classes.  Encouraged Administration of Medications, Exactly as Prescribed. Encouraged Contact with CSW (# [phone]), if You Have Questions, Need Assistance, or If Additional Social Work Needs Are Identified Between Now & Our Next Follow-Up Outreach Call. Encouraged Attendance at Follow-Up Appointment with Warner Mccreedy, Nurse Practitioner with [facility] [phone]), Scheduled on 06/19/2023 at 1:10 PM. Encouraged Attendance at Follow-Up Appointment with Abbey Chatters, Nurse Practitioner with [facility] [phone]), Scheduled on 08/24/2023 at 10:40 AM.        SDOH assessments and interventions completed:  Yes.  SDOH Interventions Today    Flowsheet Row Most Recent Value  SDOH Interventions   Food Insecurity Interventions Intervention Not Indicated  Housing Interventions Intervention Not Indicated  Transportation Interventions Intervention Not Indicated  Utilities Interventions Intervention Not Indicated  Alcohol Usage Interventions Intervention Not Indicated (Score <7)  Depression Interventions/Treatment  Referral to Psychiatry, Medication, Counseling  [Provided Counseling Agencies, Services, & Resources]  Financial Strain Interventions Intervention Not Indicated  Physical Activity Interventions Intervention Not Indicated  Stress Interventions Offered [employer] Resources, Provide Counseling, Other (Comment)  [Provided Counseling Agencies, Services, & Resources]  Social Connections Interventions  Intervention Not Indicated  Health Literacy Interventions Intervention Not Indicated     Care Coordination Interventions:  Yes, provided.   Follow up plan: You Will Be Receiving a Follow-Up Outreach Call from [place], [place].   Encounter Outcome:  Patient Visit Completed.   Danford Bad, BSW, MSW, Printmaker Social Work Case Set designer Health  [facility], Population Health Direct Dial: [phone]  Fax: [phone] Email: [email or web address]: [email or web address]

## 2023-05-12 ENCOUNTER — Ambulatory Visit: Payer: PPO

## 2023-05-13 DIAGNOSIS — M2569 Stiffness of other specified joint, not elsewhere classified: Secondary | ICD-10-CM | POA: Diagnosis not present

## 2023-05-13 DIAGNOSIS — M5451 Vertebrogenic low back pain: Secondary | ICD-10-CM | POA: Diagnosis not present

## 2023-05-13 DIAGNOSIS — R293 Abnormal posture: Secondary | ICD-10-CM | POA: Diagnosis not present

## 2023-05-13 DIAGNOSIS — M6281 Muscle weakness (generalized): Secondary | ICD-10-CM | POA: Diagnosis not present

## 2023-05-14 ENCOUNTER — Ambulatory Visit: Payer: PPO

## 2023-05-15 ENCOUNTER — Other Ambulatory Visit: Payer: Self-pay | Admitting: Nurse Practitioner

## 2023-05-15 DIAGNOSIS — M5451 Vertebrogenic low back pain: Secondary | ICD-10-CM | POA: Diagnosis not present

## 2023-05-15 DIAGNOSIS — M6281 Muscle weakness (generalized): Secondary | ICD-10-CM | POA: Diagnosis not present

## 2023-05-15 DIAGNOSIS — M5416 Radiculopathy, lumbar region: Secondary | ICD-10-CM

## 2023-05-15 DIAGNOSIS — M2569 Stiffness of other specified joint, not elsewhere classified: Secondary | ICD-10-CM | POA: Diagnosis not present

## 2023-05-15 DIAGNOSIS — R293 Abnormal posture: Secondary | ICD-10-CM | POA: Diagnosis not present

## 2023-05-15 NOTE — Telephone Encounter (Signed)
Patient is requesting a refill of the following medications: Requested Prescriptions   Pending Prescriptions Disp Refills   pregabalin (LYRICA) 75 MG capsule [Pharmacy Med Name: PREGABALIN 75 MG CAPSULE] 60 capsule 1    Sig: TAKE 1 CAPSULE BY MOUTH TWICE A DAY    Date of last refill: 03/16/2023  Refill amount: 1  Treatment agreement date: Unknown

## 2023-05-19 ENCOUNTER — Telehealth: Payer: Self-pay

## 2023-05-19 ENCOUNTER — Ambulatory Visit: Payer: PPO

## 2023-05-19 DIAGNOSIS — M5416 Radiculopathy, lumbar region: Secondary | ICD-10-CM

## 2023-05-19 DIAGNOSIS — G8929 Other chronic pain: Secondary | ICD-10-CM

## 2023-05-19 NOTE — Telephone Encounter (Signed)
Has she seen the neurospine doctor?

## 2023-05-19 NOTE — Telephone Encounter (Signed)
Called patient and she does not recall going to a neurospine doctor. Patient states that she would love to try one out if it will help her.

## 2023-05-19 NOTE — Telephone Encounter (Signed)
Patient called to see if she could get a referral to an orthopedic surgeon for her back & hip pain. She reported that physical therapy seems to be making pain worse. She would like a office in [place].

## 2023-05-21 ENCOUNTER — Ambulatory Visit: Payer: PPO

## 2023-05-21 NOTE — Telephone Encounter (Signed)
Referral placed for neurosurgery & spine to evaluate and treat

## 2023-05-21 NOTE — Addendum Note (Signed)
Addended by: [name] on: 05/21/2023 04:48 PM   Modules accepted: Orders

## 2023-05-22 ENCOUNTER — Telehealth: Payer: PPO | Admitting: Nurse Practitioner

## 2023-05-22 ENCOUNTER — Encounter: Payer: Self-pay | Admitting: Nurse Practitioner

## 2023-05-22 ENCOUNTER — Ambulatory Visit: Payer: Self-pay | Admitting: *Deleted

## 2023-05-22 DIAGNOSIS — R110 Nausea: Secondary | ICD-10-CM

## 2023-05-22 MED ORDER — ONDANSETRON 4 MG PO TBDP: 4.0000 mg | ORAL_TABLET | Freq: Three times a day (TID) | ORAL | 0 refills | Status: DC | PRN

## 2023-05-22 NOTE — Patient Instructions (Signed)
Visit Information  Thank you for taking time to visit with me today. Please don't hesitate to contact me if I can be of assistance to you.   Following are the goals we discussed today:   Goals Addressed               This Visit's Progress     Receive Counseling & Supportive Services to Reduce & Manage Symptoms of Anxiety & Depression. (pt-stated)         Activities and task to complete in order to accomplish goals.   Call your insurance provider for more information about your Enhanced Benefits  Review list of in-network providers and select and call to schedule your counseling appointment- as well as with Psych/Provider for medication management if desired. Start / continue relaxed breathing 3 times daily Keep all upcoming appointment discussed today Continue with compliance of taking medication prescribed by Doctor Self Support options    Call your PCP today to discuss your being "nauseated since Monday night" Call [phone] for behavioral health support by phone 24/7 Call 911 for emergent needs         Our next appointment is by telephone on 05/25/23  Please call the care guide team at [phone] if you need to cancel or reschedule your appointment.   If you are experiencing a Mental Health or Behavioral Health Crisis or need someone to talk to, please call the Suicide and Crisis Lifeline: 988 call 911   The patient verbalized understanding of instructions, educational materials, and care plan provided today and DECLINED offer to receive copy of patient instructions, educational materials, and care plan.   Telephone follow up appointment with care management team member scheduled for: 05/25/23  Reece Levy, MSW, LCSW Clinical Social Worker [phone]

## 2023-05-22 NOTE — Progress Notes (Signed)
Careteam: Patient Care Team: [name], NP as PCP - General (Geriatric Medicine) Corky Crafts, MD as PCP - Cardiology (Cardiology) Buck Mam, LCSW as Social Worker  Advanced Directive information    Allergies  Allergen Reactions   Codeine Other (See Comments)    Nausea     Chief Complaint  Patient presents with   Acute Visit    Patient reports nausea for a few weeks now.     HPI: Patient is a 77 y.o. female due to nausea.  Reports she has been feeling bad this week.  Reports she has had nausea on and off for 5 days.   For 2 days she had diarrhea but none in 3 days.   Has been able to keep down fluids.  No fevers  Reports blood pressure was normal this morning.  Having stomach pain from nausea- uneasy stomach but not tender.  Feeling dizzy.  Has not vomiting but felt very nauseated.  Neighbor felt bad but only for 24 hours.    Review of Systems:  Review of Systems  Constitutional:  Positive for malaise/fatigue. Negative for chills, fever and weight loss.  Gastrointestinal:  Positive for nausea. Negative for abdominal pain, blood in stool, constipation, diarrhea, melena and vomiting.    Past Medical History:  Diagnosis Date   Anxiety    Basal cell carcinoma    Depression    Hypertension    Neuromuscular disorder (HCC)    Osteoarthritis    Osteopenia    Parkinson disease    Pneumonia    Tremors of nervous system    legs   uterine ca 10/08/2021   also endometrial cancer   Past Surgical History:  Procedure Laterality Date   CARPAL TUNNEL RELEASE Bilateral 1996/1997   CYSTOSCOPY N/A 10/08/2021   Procedure: CYSTOSCOPY;  Surgeon: Carver Fila, MD;  Location: [facility];  Service: Gynecology;  Laterality: N/A;   ROBOTIC ASSISTED TOTAL HYSTERECTOMY WITH BILATERAL SALPINGO OOPHERECTOMY N/A 10/08/2021   Procedure: XI ROBOTIC ASSISTED TOTAL HYSTERECTOMY WITH BILATERAL SALPINGO OOPHORECTOMY;SENTINEL LYMPH NODE INJECTION;  Surgeon:  Carver Fila, MD;  Location: [facility];  Service: Gynecology;  Laterality: N/A;   SQUAMOUS CELL CARCINOMA EXCISION Left 2017   Left jawline   TUBAL LIGATION  1980's   Social History:   reports that she has never smoked. She has never been exposed to tobacco smoke. She has never used smokeless tobacco. She reports that she does not currently use alcohol. She reports that she does not currently use drugs.  Family History  Problem Relation Age of Onset   Endometrial cancer Mother    Diabetes Mother    Alzheimer's disease Father    Coronary artery disease Father    Diabetes Sister    COPD Sister    Diabetes Sister    Heart disease Brother    Other Brother        MVA    Parkinson's disease Brother    Heart disease Brother    Alcohol abuse Brother    Heart disease Brother    Parkinson's disease Maternal Aunt    Breast cancer Maternal Aunt    Colon cancer Paternal Aunt    Parkinson's disease Paternal Aunt    Lung cancer Paternal Uncle    Healthy Son    Healthy Son    Prostate cancer Neg Hx    Pancreatic cancer Neg Hx    Ovarian cancer Neg Hx     Medications: Patient's Medications  New  Prescriptions   No medications on file  Previous Medications   ACETAMINOPHEN (TYLENOL PO)    Take 1 tablet by mouth as needed.   CARBIDOPA-LEVODOPA (SINEMET IR) 25-100 MG TABLET    Take 2 tablets by mouth 5 (five) times daily.   ESCITALOPRAM (LEXAPRO) 5 MG TABLET    Take 5 mg by mouth daily.   LISINOPRIL (ZESTRIL) 10 MG TABLET    Take 10 mg by mouth at bedtime as needed (Take if blood pressure > 150/90 at bedtime.).   PREGABALIN (LYRICA) 75 MG CAPSULE    TAKE 1 CAPSULE BY MOUTH TWICE A DAY  Modified Medications   No medications on file  Discontinued Medications   No medications on file    Physical Exam:  There were no vitals filed for this visit. There is no height or weight on file to calculate BMI. Wt Readings from Last 3 Encounters:  05/04/23 155 lb 12.8 oz (70.7 kg)  03/16/23  151 lb (68.5 kg)  02/16/23 154 lb (69.9 kg)    Physical Exam Constitutional:      Appearance: Normal appearance.  Pulmonary:     Effort: Pulmonary effort is normal.  Neurological:     Mental Status: She is alert. Mental status is at baseline.  Psychiatric:        Mood and Affect: Mood normal.     Labs reviewed: Basic Metabolic Panel: Recent Labs    11/14/22 1427  NA 144  K 3.6  CL 104  CO2 31  GLUCOSE 127  BUN 13  CREATININE 0.82  CALCIUM 9.7   Liver Function Tests: Recent Labs    11/14/22 1427  AST 12  ALT 5*  BILITOT 0.5  PROT 6.2   No results for input(s): "LIPASE", "AMYLASE" in the last 8760 hours. No results for input(s): "AMMONIA" in the last 8760 hours. CBC: Recent Labs    11/14/22 1427  WBC 5.8  NEUTROABS 3,608  HGB 12.9  HCT 38.0  MCV 91.6  PLT 249   Lipid Panel: Recent Labs    11/14/22 1427  CHOL 190  HDL 53  LDLCALC 86  TRIG 386*  CHOLHDL 3.6   TSH: No results for input(s): "TSH" in the last 8760 hours. A1C: No results found for: "HGBA1C"   Assessment/Plan 1. Nausea -continue to stay hydrated, encouraged fluids and electrolyte drinks -bland diet, advance diet as tolerated.  - ondansetron (ZOFRAN-ODT) 4 MG disintegrating tablet; Take 1 tablet (4 mg total) by mouth every 8 (eight) hours as needed for nausea or vomiting.  Dispense: 20 tablet; Refill: 0 -avoid OTC medications for nausea and antidiarrheal medication.  Strict precautions given to go to ED if symptoms worsen or not able to keep down fluids   Daysia Vandenboom K. Biagio Borg  [facility] [phone]    Virtual Visit via video  I connected with patient on 05/22/23 at  2:00 PM EDT by mychart and verified that I am speaking with the correct person using two identifiers.  Location: Patient: home Provider: [facility]    I discussed the limitations, risks, security and privacy concerns of performing an evaluation and management service by telephone and  the availability of in person appointments. I also discussed with the patient that there may be a patient responsible charge related to this service. The patient expressed understanding and agreed to proceed.    I discussed the assessment and treatment plan with the patient. The patient was provided an opportunity to ask questions and all  were answered. The patient agreed with the plan and demonstrated an understanding of the instructions.   The patient was advised to call back or seek an in-person evaluation if the symptoms worsen or if the condition fails to improve as anticipated.  I provided 15 minutes of non-face-to-face time during this encounter.  Janene Harvey. Biagio Borg Av[employer] printed and mailed

## 2023-05-22 NOTE — Patient Instructions (Signed)
Bland diet, advance as tolerates Stay hydrated- continue water and electrolyte drinks  Zofran as needed for nausea sent to pharmacy

## 2023-05-22 NOTE — Patient Outreach (Signed)
Care Coordination   Follow Up Visit Note   05/22/2023 Name: Ashley Neal MRN: [id number] DOB: Mar 11, 1946  Ashley Neal is a 77 y.o. year old female who sees Eubanks, Janene Harvey, NP for primary care. I spoke with  Ashley Neal by phone today.  What matters to the patients health and wellness today?  Pt reports being nauseated since Monday- encouraged phone call to PCP    Goals Addressed               This Visit's Progress     Receive Counseling & Supportive Services to Reduce & Manage Symptoms of Anxiety & Depression. (pt-stated)         Activities and task to complete in order to accomplish goals.   Call your insurance provider for more information about your Enhanced Benefits  Review list of in-network providers and select and call to schedule your counseling appointment- as well as with Psych/Provider for medication management if desired. Start / continue relaxed breathing 3 times daily Keep all upcoming appointment discussed today Continue with compliance of taking medication prescribed by Doctor Self Support options    Call your PCP today to discuss your being "nauseated since Monday night" Call [phone] for behavioral health support by phone 24/7 Call 911 for emergent needs         SDOH assessments and interventions completed:  Yes     Care Coordination Interventions:  Yes, provided  Interventions Today    Flowsheet Row Most Recent Value  Chronic Disease   Chronic disease during today's visit Other  [Parkinsons, Depression and Anxiety]  General Interventions   General Interventions Discussed/Reviewed General Interventions Discussed, [facility], Doctor Visits  [Pt reports nausea x 5 days- encouraged pt to call PCP to advise and get guidance]  Doctor Visits Discussed/Reviewed Doctor Visits Discussed, Specialist  [Pt has received a list of behavioral health Providers from her insurance company- she has reviewed and will select some options to call and  see about scheduling- she is unsure if she wants/needs Psychiatry but does plan to pursue counseling/therapy.]  Mental Health Interventions   Mental Health Discussed/Reviewed Mental Health Discussed, Depression, Anxiety, Mental Health Reviewed, Coping Strategies  [Pt reports history of depression/anxiety- would like to get connected with counseling again and she is not sure if she wants/needs Psychiatry- does not want to add RX's. Currently taking Lexapro and Lorazepam]  Pharmacy Interventions   Pharmacy Dicussed/Reviewed Pharmacy Topics Discussed  [Pt indicates taking her Lexapro and LOrazepam as ordered- doe not want to add more RX's-]  Safety Interventions   Safety Discussed/Reviewed Safety Discussed  [Pt advised to call "[phone]" if needs to talk to a behavioral health specialist 24/7 phone line]       Follow up plan: Follow up call scheduled for 05/25/23    Encounter Outcome:  Patient Visit Completed

## 2023-05-25 ENCOUNTER — Ambulatory Visit: Payer: Self-pay | Admitting: *Deleted

## 2023-05-25 NOTE — Patient Outreach (Signed)
Care Coordination   Follow Up Visit Note   05/25/2023 Name: Ashley Neal MRN: [id number] DOB: 17-Sep-1945  Ashley Neal is a 77 y.o. year old female who sees Eubanks, Janene Harvey, NP for primary care. I spoke with  Ashley Neal by phone today.  What matters to the patients health and wellness today?  "Got some medicine to help with nausea and now my blood pressure is low".     Goals Addressed               This Visit's Progress     Receive Counseling & Supportive Services to Reduce & Manage Symptoms of Anxiety & Depression. (pt-stated)         Activities and task to complete in order to accomplish goals.   Call your insurance provider for more information about your Enhanced Benefits as well as to speak with the Navigator for assistance  Review list of in-network providers and select and call to schedule your counseling appointment- as well as with Psych/Provider for medication management if desired. Start / continue relaxed breathing 3 times daily Keep all upcoming appointment discussed today Continue with compliance of taking medication prescribed by Doctor Self Support options    Call [phone] for behavioral health support by phone 24/7 Call 911 for emergent needs         SDOH assessments and interventions completed:  Yes     Care Coordination Interventions:  Yes, provided  Interventions Today    Flowsheet Row Most Recent Value  Chronic Disease   Chronic disease during today's visit Other  [Parkinson's, depression and anxiety]  General Interventions   General Interventions Discussed/Reviewed Doctor Visits  [Pt reports plan to see PCP later this week]  Doctor Visits Discussed/Reviewed Doctor Visits Discussed  PCP/Specialist Visits Compliance with follow-up visit  [Reminded pt of plans to review and select from the in-network providers for behavioral health counseling- she has not been feeling well and has not had chance to do this yet- encouraged pt to call  and/or reach out to HTA Navigator for assistance.]  Exercise Interventions   Exercise Discussed/Reviewed Exercise Discussed, Physical Activity  [Pt reports "put HHPT on hold" due to back pain]  Education Interventions   Provided Verbal Education On Mental Health/Coping with Illness  Mental Health Interventions   Mental Health Discussed/Reviewed Mental Health Discussed, Coping Strategies, Depression, Anxiety  [Pt plans to pursue appointment with a new Counselor. She has been provided with lists of options and will review, select and call to schedule  this.]       Follow up plan: Follow up call scheduled for 06/03/23    Encounter Outcome:  Patient Visit Completed

## 2023-05-25 NOTE — Patient Instructions (Signed)
Visit Information  Thank you for taking time to visit with me today. Please don't hesitate to contact me if I can be of assistance to you.   Following are the goals we discussed today:   Goals Addressed               This Visit's Progress     Receive Counseling & Supportive Services to Reduce & Manage Symptoms of Anxiety & Depression. (pt-stated)         Activities and task to complete in order to accomplish goals.   Call your insurance provider for more information about your Enhanced Benefits as well as to speak with the Navigator for assistance  Review list of in-network providers and select and call to schedule your counseling appointment- as well as with Psych/Provider for medication management if desired. Start / continue relaxed breathing 3 times daily Keep all upcoming appointment discussed today Continue with compliance of taking medication prescribed by Doctor Self Support options    Call [phone] for behavioral health support by phone 24/7 Call 911 for emergent needs         Our next appointment is by telephone on 06/03/23   Please call the care guide team at [phone] if you need to cancel or reschedule your appointment.   If you are experiencing a Mental Health or Behavioral Health Crisis or need someone to talk to, please call the Suicide and Crisis Lifeline: 988 call 911   The patient verbalized understanding of instructions, educational materials, and care plan provided today and DECLINED offer to receive copy of patient instructions, educational materials, and care plan.   Telephone follow up appointment with care management team member scheduled for:06/03/23  Reece Levy, MSW, LCSW Clinical Social Worker [phone]

## 2023-05-26 ENCOUNTER — Encounter: Payer: PPO | Admitting: Nurse Practitioner

## 2023-05-26 ENCOUNTER — Other Ambulatory Visit: Payer: Self-pay

## 2023-05-26 ENCOUNTER — Encounter (HOSPITAL_COMMUNITY): Payer: Self-pay

## 2023-05-26 ENCOUNTER — Telehealth: Payer: Self-pay | Admitting: *Deleted

## 2023-05-26 ENCOUNTER — Emergency Department (HOSPITAL_COMMUNITY)
Admission: EM | Admit: 2023-05-26 | Discharge: 2023-05-26 | Disposition: A | Discharge status: Home / Self Care | Payer: PPO | Attending: Emergency Medicine | Admitting: Emergency Medicine

## 2023-05-26 DIAGNOSIS — I951 Orthostatic hypotension: Secondary | ICD-10-CM | POA: Diagnosis not present

## 2023-05-26 DIAGNOSIS — G20C Parkinsonism, unspecified: Secondary | ICD-10-CM | POA: Diagnosis not present

## 2023-05-26 DIAGNOSIS — R42 Dizziness and giddiness: Secondary | ICD-10-CM | POA: Diagnosis not present

## 2023-05-26 DIAGNOSIS — Z79899 Other long term (current) drug therapy: Secondary | ICD-10-CM | POA: Diagnosis not present

## 2023-05-26 DIAGNOSIS — I10 Essential (primary) hypertension: Secondary | ICD-10-CM | POA: Insufficient documentation

## 2023-05-26 DIAGNOSIS — E876 Hypokalemia: Secondary | ICD-10-CM | POA: Diagnosis not present

## 2023-05-26 DIAGNOSIS — R1084 Generalized abdominal pain: Secondary | ICD-10-CM | POA: Diagnosis not present

## 2023-05-26 DIAGNOSIS — R110 Nausea: Secondary | ICD-10-CM | POA: Diagnosis not present

## 2023-05-26 LAB — COMPREHENSIVE METABOLIC PANEL
ALT: <5 U/L (ref 0–44)
AST: 9 U/L — ABNORMAL LOW (ref 15–41)
Albumin: 4 g/dL (ref 3.5–5.0)
Alkaline Phosphatase: 79 U/L (ref 38–126)
Anion gap: 8 (ref 5–15)
BUN: 7 mg/dL — ABNORMAL LOW (ref 8–23)
CO2: 26 mmol/L (ref 22–32)
Calcium: 8.8 mg/dL — ABNORMAL LOW (ref 8.9–10.3)
Chloride: 102 mmol/L (ref 98–111)
Creatinine, Ser: 0.83 mg/dL (ref 0.44–1.00)
GFR, Estimated: >60 mL/min (ref >60)
Glucose, Bld: 101 mg/dL — ABNORMAL HIGH (ref 70–99)
Potassium: 3.1 mmol/L — ABNORMAL LOW (ref 3.5–5.1)
Sodium: 136 mmol/L (ref 135–145)
Total Bilirubin: 1.1 mg/dL (ref 0.3–1.2)
Total Protein: 6.4 g/dL — ABNORMAL LOW (ref 6.5–8.1)

## 2023-05-26 LAB — CBC WITH DIFFERENTIAL/PLATELET
Abs Immature Granulocytes: 0.01 10*3/uL (ref 0.00–0.07)
Basophils Absolute: 0 10*3/uL (ref 0.0–0.1)
Basophils Relative: 1 %
Eosinophils Absolute: 0 10*3/uL (ref 0.0–0.5)
Eosinophils Relative: 0 %
HCT: 39.9 % (ref 36.0–46.0)
Hemoglobin: 13.6 g/dL (ref 12.0–15.0)
Immature Granulocytes: 0 %
Lymphocytes Relative: 16 %
Lymphs Abs: 0.8 10*3/uL (ref 0.7–4.0)
MCH: 30.2 pg (ref 26.0–34.0)
MCHC: 34.1 g/dL (ref 30.0–36.0)
MCV: 88.7 fL (ref 80.0–100.0)
Monocytes Absolute: 0.5 10*3/uL (ref 0.1–1.0)
Monocytes Relative: 10 %
Neutro Abs: 3.6 10*3/uL (ref 1.7–7.7)
Neutrophils Relative %: 73 %
Platelets: 205 10*3/uL (ref 150–400)
RBC: 4.5 MIL/uL (ref 3.87–5.11)
RDW: 12.4 % (ref 11.5–15.5)
WBC: 4.9 10*3/uL (ref 4.0–10.5)
nRBC: 0 % (ref 0.0–0.2)

## 2023-05-26 LAB — LIPASE, BLOOD: Lipase: 31 U/L (ref 11–51)

## 2023-05-26 MED ORDER — POTASSIUM CHLORIDE CRYS ER 20 MEQ PO TBCR
40.0000 meq | EXTENDED_RELEASE_TABLET | Freq: Once | ORAL | Status: AC
  Administered 2023-05-26: 40 meq via ORAL
  Filled 2023-05-26: qty 2

## 2023-05-26 NOTE — Discharge Instructions (Signed)
You are seen in the emergency department for your dizziness.  On your workup your potassium level was slightly low but otherwise had no signs of severe dehydration or anemia.  We did replete your potassium.  Your vital signs were abnormal when you went from lying sitting to standing and dropped more than appropriate.  This is likely related to your Parkinson's and was exacerbated with the gabapentin that you were taking.  You should stop taking the gabapentin.  You can continue to take the Zofran as needed for nausea and make sure you are drinking plenty of fluids.  You should follow-up with your primary doctor and your neurologist to have your symptoms rechecked and to see if you need further medication changes.  You should wear your compression stockings when you are up and walking around and take your time when standing up to prevent your blood fracture from dropping too much and injuring yourself.  You should return to the emergency department if you have recurrent episodes of passing out, you injure yourself when you fall, you are vomiting and unable to keep down any liquids or if you have any other new or concerning symptoms.

## 2023-05-26 NOTE — ED Notes (Signed)
Dr. Theresia Lo notified of pt orthostatic vitals

## 2023-05-26 NOTE — Telephone Encounter (Signed)
Noted thank you

## 2023-05-26 NOTE — ED Triage Notes (Signed)
BIB EMS from home for nausea and lower back pain. Pt has had lower back pain for 2 years. Pt was put on gabapentin by PCP and has been taking it for 3 weeks. Pt believes her nausea, upset stomach, and dry mouth are caused by the gabapentin.

## 2023-05-26 NOTE — Telephone Encounter (Signed)
Spoke with patient to do her appointment for Dizziness and weakness and she was Hysterical and crying stating that she didn't need to talk with Shanda Bumps she needed to Call 911. Stated that she has been sick for over 2 weeks now and feels like she worsened over night. Stated that she has Parkinsonism. Stated that she thinks she is having an allergic reaction to Gabapentin. Did not want to wait to speak with Shanda Bumps.   Patient stated that she is hanging up and calling 911.

## 2023-05-26 NOTE — ED Provider Notes (Signed)
[name] EMERGENCY DEPARTMENT AT [facility] Provider Note   CSN: [id number] Arrival date & time: 05/26/23  1202     History  Chief Complaint  Patient presents with   Medication Reaction    Ashley Neal is a 77 y.o. female.  Patient is a 77 year old female with a past medical history of Parkinson's, hypertension and chronic back pain presenting to the emergency department with nausea and dizziness.  The patient states for the last 2 weeks she has had nausea with associated dizziness.  She states that she has passed out 3 times in the last 2 weeks due to the dizziness.  She states that she has not had any vomiting but his had a decreased appetite.  She states that she has been trying to drink fluids and electrolyte drinks.  She states that she did have a few days of diarrhea last week that is now resolved.  She denies any associated abdominal pain or chest pain.  She states about 3 weeks ago she was started on gabapentin so thought that she may be having an adverse effect of the gabapentin and stopped taking this 2 days ago.  She states that her primary doctor prescribed her Zofran which has helped for the nausea but because she has had continued dizziness she decided to come to the emergency department to be evaluated.  The history is provided by the patient.       Home Medications Prior to Admission medications   Medication Sig Start Date End Date Taking? Authorizing Provider  Acetaminophen (TYLENOL PO) Take 1 tablet by mouth as needed.    [provider]  carbidopa-levodopa (SINEMET IR) 25-100 MG tablet Take 2 tablets by mouth 5 (five) times daily.    [provider]  escitalopram (LEXAPRO) 5 MG tablet Take 5 mg by mouth daily.    [provider]  lisinopril (ZESTRIL) 10 MG tablet Take 10 mg by mouth at bedtime as needed (Take if blood pressure > 150/90 at bedtime.).    [provider]  ondansetron (ZOFRAN-ODT) 4 MG disintegrating  tablet Take 1 tablet (4 mg total) by mouth every 8 (eight) hours as needed for nausea or vomiting. 05/22/23   [name], NP  pregabalin (LYRICA) 75 MG capsule TAKE 1 CAPSULE BY MOUTH TWICE A DAY 05/15/23   [name], NP      Allergies    Codeine    Review of Systems   Review of Systems  Physical Exam Updated Vital Signs BP (!) 193/97   Pulse 83   Temp 98.5 F (36.9 C) (Oral)   Resp 20   Ht 5\' 1"  (1.549 m)   Wt 65.8 kg   SpO2 97%   BMI 27.40 kg/m  Physical Exam Vitals and nursing note reviewed.  Constitutional:      General: She is not in acute distress.    Appearance: Normal appearance.  HENT:     Head: Normocephalic and atraumatic.     Nose: Nose normal.     Mouth/Throat:     Mouth: Mucous membranes are moist.     Pharynx: Oropharynx is clear.  Eyes:     Extraocular Movements: Extraocular movements intact.     Conjunctiva/sclera: Conjunctivae normal.  Cardiovascular:     Rate and Rhythm: Normal rate and regular rhythm.     Heart sounds: Normal heart sounds.  Pulmonary:     Effort: Pulmonary effort is normal.     Breath sounds: Normal breath sounds.  Abdominal:     General: Abdomen is flat.     Palpations: Abdomen is soft.     Tenderness: There is no abdominal tenderness.  Musculoskeletal:        General: Normal range of motion.     Cervical back: Normal range of motion.  Skin:    General: Skin is warm and dry.  Neurological:     General: No focal deficit present.     Mental Status: She is alert and oriented to person, place, and time.     Sensory: No sensory deficit.     Motor: No weakness.  Psychiatric:        Mood and Affect: Mood normal.        Behavior: Behavior normal.     ED Results / Procedures / Treatments   Labs (all labs ordered are listed, but only abnormal results are displayed) Labs Reviewed  COMPREHENSIVE METABOLIC PANEL - Abnormal; Notable for the following components:      Result Value   Potassium 3.1 (*)     Glucose, Bld 101 (*)    BUN 7 (*)    Calcium 8.8 (*)    Total Protein 6.4 (*)    AST 9 (*)    All other components within normal limits  CBC WITH DIFFERENTIAL/PLATELET  LIPASE, BLOOD    EKG EKG Interpretation Date/Time:  Tuesday May 26 2023 14:05:01 EDT Ventricular Rate:  70 PR Interval:  124 QRS Duration:  94 QT Interval:  412 QTC Calculation: 445 R Axis:   29  Text Interpretation: Sinus rhythm Nonspecific repol abnormality, diffuse leads No significant change since last tracing Confirmed by Elayne Snare ([id number]) on 05/26/2023 2:12:58 PM  Radiology No results found.  Procedures Procedures    Medications Ordered in ED Medications  potassium chloride SA (KLOR-CON M) CR tablet 40 mEq (has no administration in time range)    ED Course/ Medical Decision Making/ A&P Clinical Course as of 05/26/23 1445  Tue May 26, 2023  1412 Labs with mild hypokalemia otherwise within normal range.  Potassium will be repleted.  Orthostatics are pending. [VK]  1444 Orthostatics are positive, likely causing patient's symptoms.  The patient was recommended to discontinue her gabapentin.  She reports she has had no changes in her carbidopa levodopa in the last year so it is less likely that this is causing her symptoms and is probably just related to her Parkinson's.  She states that she does have compression stockings at home.  She states that she would prefer to manage her symptoms as an outpatient and is stable for discharge with primary care and neurology follow-up.  She was given strict return precautions. [VK]    Clinical Course User Index [VK] Rexford Maus, DO                                 Medical Decision Making This patient presents to the ED with chief complaint(s) of dizziness, nausea with pertinent past medical history of Parkinson's, chronic back pain, hypertension which further complicates the presenting complaint. The complaint involves an extensive differential  diagnosis and also carries with it a high risk of complications and morbidity.    The differential diagnosis includes orthostatic hypotension, arrhythmia, anemia, dehydration, electrolyte abnormality, gastritis, GERD, gastroenteritis, pancreatitis, hepatitis  Additional history obtained: Additional history obtained from N/A Records reviewed Primary Care Documents  ED Course and Reassessment: On patient's arrival she is hemodynamically stable in  no acute distress, no neurologic deficits on exam, no signs of vertigo.  She will have EKG and labs performed to evaluate for cause of her nausea and dizziness and will be closely reassessed.  Independent labs interpretation:  The following labs were independently interpreted: Mild hypokalemia otherwise within normal range  Independent visualization of imaging: - N/A  Consultation: - Consulted or discussed management/test interpretation w/ external professional: N/A  Consideration for admission or further workup: Patient has no emergent conditions requiring admission or further work-up at this time and is stable for discharge home with primary care follow-up  Social Determinants of health: N/A    Amount and/or Complexity of Data Reviewed Labs: ordered.  Risk Prescription drug management.          Final Clinical Impression(s) / ED Diagnoses Final diagnoses:  Orthostatic hypotension    Rx / DC Orders ED Discharge Orders     None         Rexford Maus, DO 05/26/23 1445

## 2023-05-26 NOTE — Progress Notes (Signed)
error 

## 2023-05-27 ENCOUNTER — Emergency Department (HOSPITAL_BASED_OUTPATIENT_CLINIC_OR_DEPARTMENT_OTHER): Payer: PPO

## 2023-05-27 ENCOUNTER — Emergency Department (HOSPITAL_BASED_OUTPATIENT_CLINIC_OR_DEPARTMENT_OTHER)
Admission: EM | Admit: 2023-05-27 | Discharge: 2023-05-28 | Disposition: A | Discharge status: Home / Self Care | Payer: PPO | Attending: Emergency Medicine | Admitting: Emergency Medicine

## 2023-05-27 ENCOUNTER — Encounter (HOSPITAL_BASED_OUTPATIENT_CLINIC_OR_DEPARTMENT_OTHER): Payer: Self-pay | Admitting: Emergency Medicine

## 2023-05-27 DIAGNOSIS — I10 Essential (primary) hypertension: Secondary | ICD-10-CM | POA: Diagnosis not present

## 2023-05-27 DIAGNOSIS — R42 Dizziness and giddiness: Secondary | ICD-10-CM | POA: Insufficient documentation

## 2023-05-27 DIAGNOSIS — G20C Parkinsonism, unspecified: Secondary | ICD-10-CM | POA: Insufficient documentation

## 2023-05-27 DIAGNOSIS — Z79899 Other long term (current) drug therapy: Secondary | ICD-10-CM | POA: Diagnosis not present

## 2023-05-27 DIAGNOSIS — I672 Cerebral atherosclerosis: Secondary | ICD-10-CM | POA: Diagnosis not present

## 2023-05-27 LAB — CBG MONITORING, ED: Glucose-Capillary: 114 mg/dL — ABNORMAL HIGH (ref 70–99)

## 2023-05-27 NOTE — ED Notes (Signed)
ED Provider at bedside. 

## 2023-05-27 NOTE — ED Notes (Signed)
Patient transported to CT via stretcher with tech

## 2023-05-27 NOTE — ED Triage Notes (Signed)
Was seen at [facility] long yesterday, she states she didn't receive any care. Nausea/dizzy for 13 days.

## 2023-05-27 NOTE — ED Notes (Addendum)
Patient reports feeling nauseated for 4 weeks. Patient also reports 4 days of diarrhea with fatigue. Patient reports hx of Parkinson's and states she "feels bad". Caregiver at bedside reports acute onset of confusion since she has been here. Patient reports multiple falls in three weeks with last fall one week ago and reports hitting her head; denies blood thinners. Patient reports she stopped taking lyrica "maybe a few weeks ago". Patient is AAOx4, GCS 15 at this time.

## 2023-05-28 ENCOUNTER — Encounter: Payer: PPO | Admitting: Family

## 2023-05-28 LAB — CBC WITH DIFFERENTIAL/PLATELET
Abs Immature Granulocytes: 0.01 10*3/uL (ref 0.00–0.07)
Basophils Absolute: 0 10*3/uL (ref 0.0–0.1)
Basophils Relative: 1 %
Eosinophils Absolute: 0 10*3/uL (ref 0.0–0.5)
Eosinophils Relative: 0 %
HCT: 38.8 % (ref 36.0–46.0)
Hemoglobin: 13.6 g/dL (ref 12.0–15.0)
Immature Granulocytes: 0 %
Lymphocytes Relative: 23 %
Lymphs Abs: 1.1 10*3/uL (ref 0.7–4.0)
MCH: 30.2 pg (ref 26.0–34.0)
MCHC: 35.1 g/dL (ref 30.0–36.0)
MCV: 86.2 fL (ref 80.0–100.0)
Monocytes Absolute: 0.6 10*3/uL (ref 0.1–1.0)
Monocytes Relative: 13 %
Neutro Abs: 2.9 10*3/uL (ref 1.7–7.7)
Neutrophils Relative %: 63 %
Platelets: 228 10*3/uL (ref 150–400)
RBC: 4.5 MIL/uL (ref 3.87–5.11)
RDW: 12.8 % (ref 11.5–15.5)
WBC: 4.6 10*3/uL (ref 4.0–10.5)
nRBC: 0 % (ref 0.0–0.2)

## 2023-05-28 LAB — BASIC METABOLIC PANEL
Anion gap: 11 (ref 5–15)
BUN: 8 mg/dL (ref 8–23)
CO2: 25 mmol/L (ref 22–32)
Calcium: 9.7 mg/dL (ref 8.9–10.3)
Chloride: 102 mmol/L (ref 98–111)
Creatinine, Ser: 1 mg/dL (ref 0.44–1.00)
GFR, Estimated: 58 mL/min — ABNORMAL LOW (ref >60)
Glucose, Bld: 108 mg/dL — ABNORMAL HIGH (ref 70–99)
Potassium: 3.4 mmol/L — ABNORMAL LOW (ref 3.5–5.1)
Sodium: 138 mmol/L (ref 135–145)

## 2023-05-28 LAB — URINALYSIS, ROUTINE W REFLEX MICROSCOPIC
Glucose, UA: NEGATIVE mg/dL
Hgb urine dipstick: NEGATIVE
Ketones, ur: 15 mg/dL — AB
Nitrite: NEGATIVE
Specific Gravity, Urine: 1.035 — ABNORMAL HIGH (ref 1.005–1.030)
pH: 6 (ref 5.0–8.0)

## 2023-05-28 LAB — TROPONIN I (HIGH SENSITIVITY): Troponin I (High Sensitivity): 7 ng/L (ref <18)

## 2023-05-28 NOTE — ED Provider Notes (Signed)
[place] EMERGENCY DEPARTMENT AT [facility] Provider Note   CSN: [id number] Arrival date & time: 05/27/23  1758     History  Chief complaint-dizziness  Ashley Neal is a 77 y.o. female.  The history is provided by the patient and a friend.  Patient history of Jimmey Ralph disease presents with multiple complaints.  Patient is accompanied by a friend. Patient reports for  several weeks she has had intermittent dizziness worse with standing up.  She has had nausea without vomiting.  She has had some diarrhea.  No chest pain or shortness of breath.  No headache or vision changes She recently stopped Lyrica and thinks this may be causing her symptoms She is fallen recently, but no syncopal episodes in the past several days Friend at bedside reports she has appeared slightly confused while in the ER     Past Medical History:  Diagnosis Date   Anxiety    Basal cell carcinoma    Depression    Hypertension    Neuromuscular disorder (HCC)    Osteoarthritis    Osteopenia    Parkinson disease (HCC)    Pneumonia    Tremors of nervous system    legs   uterine ca 10/08/2021   also endometrial cancer    Home Medications Prior to Admission medications   Medication Sig Start Date End Date Taking? Authorizing Provider  Acetaminophen (TYLENOL PO) Take 1 tablet by mouth as needed.    [provider]  carbidopa-levodopa (SINEMET IR) 25-100 MG tablet Take 2 tablets by mouth 5 (five) times daily.    [provider]  escitalopram (LEXAPRO) 5 MG tablet Take 5 mg by mouth daily.    [provider]  lisinopril (ZESTRIL) 10 MG tablet Take 10 mg by mouth at bedtime as needed (Take if blood pressure > 150/90 at bedtime.).    [provider]  ondansetron (ZOFRAN-ODT) 4 MG disintegrating tablet Take 1 tablet (4 mg total) by mouth every 8 (eight) hours as needed for nausea or vomiting. 05/22/23   [name], NP  pregabalin (LYRICA) 75 MG capsule  TAKE 1 CAPSULE BY MOUTH TWICE A DAY 05/15/23   [name], NP      Allergies    Codeine    Review of Systems   Review of Systems  Constitutional:  Negative for fever.  Eyes:  Negative for visual disturbance.  Respiratory:  Negative for shortness of breath.   Cardiovascular:  Negative for chest pain.  Gastrointestinal:  Positive for nausea. Negative for vomiting.  Neurological:  Positive for dizziness. Negative for headaches.    Physical Exam Updated Vital Signs BP (!) 181/96   Pulse 72   Temp 98.2 F (36.8 C) (Oral)   Resp 20   SpO2 98%  Physical Exam CONSTITUTIONAL: Elderly, anxious HEAD: Normocephalic/atraumatic EYES: EOMI/PERRL, no nystagmus, no ptosis ENMT: Mucous membranes moist NECK: supple no meningeal signs SPINE/BACK:entire spine nontender CV: S1/S2 noted, no murmurs/rubs/gallops noted LUNGS: Lungs are clear to auscultation bilaterally, no apparent distress ABDOMEN: soft, nontender, no rebound or guarding GU:no cva tenderness NEURO:Awake/alert, face symmetric, no arm or leg drift is noted Equal 5/5 strength with shoulder abduction, elbow flex/extension, wrist flex/extension in upper extremities and equal hand grips bilaterally Equal 5/5 strength with hip flexion,knee flex/extension, foot dorsi/plantar flexion Cranial nerves 3/4/5/6/02/16/09/11/12 tested and intact Sensation to light touch intact in all extremities EXTREMITIES: pulses normal, full ROM SKIN: warm, color normal PSYCH: Anxious  ED Results / Procedures / Treatments  Labs (all labs ordered are listed, but only abnormal results are displayed) Labs Reviewed  URINALYSIS, ROUTINE W REFLEX MICROSCOPIC - Abnormal; Notable for the following components:      Result Value   APPearance HAZY (*)    Specific Gravity, Urine 1.035 (*)    Bilirubin Urine SMALL (*)    Ketones, ur 15 (*)    Protein, ur TRACE (*)    Leukocytes,Ua SMALL (*)    Bacteria, UA FEW (*)    Non Squamous Epithelial 0-5 (*)     All other components within normal limits  BASIC METABOLIC PANEL - Abnormal; Notable for the following components:   Potassium 3.4 (*)    Glucose, Bld 108 (*)    GFR, Estimated 58 (*)    All other components within normal limits  CBG MONITORING, ED - Abnormal; Notable for the following components:   Glucose-Capillary 114 (*)    All other components within normal limits  URINE CULTURE  CBC WITH DIFFERENTIAL/PLATELET  TROPONIN I (HIGH SENSITIVITY)    EKG EKG Interpretation Date/Time:  Wednesday May 27 2023 20:17:25 EDT Ventricular Rate:  83 PR Interval:  120 QRS Duration:  72 QT Interval:  374 QTC Calculation: 439 R Axis:   12  Text Interpretation: Sinus rhythm with Premature atrial complexes Abnormal ECG Interpretation limited secondary to artifact Confirmed by Zadie Rhine ([id number]) on 05/27/2023 11:05:35 PM  Radiology CT Head Wo Contrast  Result Date: 05/28/2023 CLINICAL DATA:  Nausea and dizziness EXAM: CT HEAD WITHOUT CONTRAST TECHNIQUE: Contiguous axial images were obtained from the base of the skull through the vertex without intravenous contrast. RADIATION DOSE REDUCTION: This exam was performed according to the departmental dose-optimization program which includes automated exposure control, adjustment of the mA and/or kV according to patient size and/or use of iterative reconstruction technique. COMPARISON:  None Available. FINDINGS: Brain: No evidence of acute infarction, hemorrhage, mass, mass effect, or midline shift. No hydrocephalus or extra-axial fluid collection. Normal pituitary and craniocervical junction. Normal cerebral volume for age. Gray-white differentiation is preserved. Vascular: No hyperdense vessel. Atherosclerotic calcifications in the intracranial carotid and vertebral arteries. Skull: Negative for fracture or focal lesion. Sinuses/Orbits: No acute finding. Other: The mastoid air cells are well aerated. IMPRESSION: No acute intracranial process.  Electronically Signed   By: Wiliam Ke M.D.   On: 05/28/2023 03:11    Procedures Procedures    Medications Ordered in ED Medications - No data to display  ED Course/ Medical Decision Making/ A&P Clinical Course as of 05/28/23 0315  Thu May 28, 2023  0007 Patient with history of Parkinson's presents of multiple complaints.  She reports dizziness for several weeks, usually worse with standing.  No syncope in the past few days.  Also reports nausea but no vomiting.  Friend at bedside reports she has had some confusion but at this time her GCS is 15.  Patient recently seen in the ED and was discharged home.  Will expand workup including additional labs as well as CT head [DW]  0313 CT head is pending at this time.  Patient reports she would not wait any longer for her imaging.  She has been ambulatory.  At this point she will be discharged at her request and we will call her if there is any issues on her imaging [DW]    Clinical Course User Index [DW] Zadie Rhine, MD  Medical Decision Making Amount and/or Complexity of Data Reviewed Labs: ordered. Radiology: ordered.   This patient presents to the ED for concern of dizziness, this involves an extensive number of treatment options, and is a complaint that carries with it a high risk of complications and morbidity.  The differential diagnosis includes but is not limited to CVA, intracranial hemorrhage, acute coronary syndrome, renal failure, urinary tract infection, electrolyte disturbance, pneumonia    Comorbidities that complicate the patient evaluation: Patient's presentation is complicated by their history of Parkinson's  Social Determinants of Health: Patient's  increased anxiety and limited family support   increases the complexity of managing their presentation  Additional history obtained: Additional history obtained from  friend Records reviewed Primary Care Documents  Lab Tests: I  Ordered, and personally interpreted labs.  The pertinent results include: Labs overall unremarkable  Imaging Studies ordered: I ordered imaging studies including CT scan head   I independently visualized and interpreted imaging which showed no acute findings I agree with the radiologist interpretation  Cardiac Monitoring: The patient was maintained on a cardiac monitor.  I personally viewed and interpreted the cardiac monitor which showed an underlying rhythm of:  sinus rhythm  Reevaluation: After the interventions noted above, I reevaluated the patient and found that they have :improved  Complexity of problems addressed: Patient's presentation is most consistent with  acute presentation with potential threat to life or bodily function  Disposition: After consideration of the diagnostic results and the patient's response to treatment,  I feel that the patent would benefit from discharge   .           Final Clinical Impression(s) / ED Diagnoses Final diagnoses:  Dizziness    Rx / DC Orders ED Discharge Orders     None         Zadie Rhine, MD 05/28/23 [phone]

## 2023-05-28 NOTE — ED Provider Notes (Incomplete)
  [place] EMERGENCY DEPARTMENT AT [facility] Provider Note   CSN: [id number] Arrival date & time: 05/27/23  1758     History {Add pertinent medical, surgical, social history, OB history to HPI:1} No chief complaint on file.   Ashley Neal is a 77 y.o. female.  HPI     Home Medications Prior to Admission medications   Medication Sig Start Date End Date Taking? Authorizing Provider  Acetaminophen (TYLENOL PO) Take 1 tablet by mouth as needed.    [provider]  carbidopa-levodopa (SINEMET IR) 25-100 MG tablet Take 2 tablets by mouth 5 (five) times daily.    [provider]  escitalopram (LEXAPRO) 5 MG tablet Take 5 mg by mouth daily.    [provider]  lisinopril (ZESTRIL) 10 MG tablet Take 10 mg by mouth at bedtime as needed (Take if blood pressure > 150/90 at bedtime.).    [provider]  ondansetron (ZOFRAN-ODT) 4 MG disintegrating tablet Take 1 tablet (4 mg total) by mouth every 8 (eight) hours as needed for nausea or vomiting. 05/22/23   [name], NP  pregabalin (LYRICA) 75 MG capsule TAKE 1 CAPSULE BY MOUTH TWICE A DAY 05/15/23   [name], NP      Allergies    Codeine    Review of Systems   Review of Systems  Physical Exam Updated Vital Signs BP (!) 160/92   Pulse 77   Temp 98.2 F (36.8 C) (Oral)   Resp 19   SpO2 97%  Physical Exam  ED Results / Procedures / Treatments   Labs (all labs ordered are listed, but only abnormal results are displayed) Labs Reviewed  CBG MONITORING, ED - Abnormal; Notable for the following components:      Result Value   Glucose-Capillary 114 (*)    All other components within normal limits  URINALYSIS, ROUTINE W REFLEX MICROSCOPIC  CBC WITH DIFFERENTIAL/PLATELET  BASIC METABOLIC PANEL  TROPONIN I (HIGH SENSITIVITY)    EKG EKG Interpretation Date/Time:  Wednesday May 27 2023 20:17:25 EDT Ventricular Rate:  83 PR Interval:  120 QRS  Duration:  72 QT Interval:  374 QTC Calculation: 439 R Axis:   12  Text Interpretation: Sinus rhythm with Premature atrial complexes Abnormal ECG Interpretation limited secondary to artifact Confirmed by Zadie Rhine ([id number]) on 05/27/2023 11:05:35 PM  Radiology No results found.  Procedures Procedures  {Document cardiac monitor, telemetry assessment procedure when appropriate:1}  Medications Ordered in ED Medications - No data to display  ED Course/ Medical Decision Making/ A&P   {   Click here for ABCD2, HEART and other calculatorsREFRESH Note before signing :1}                              Medical Decision Making Amount and/or Complexity of Data Reviewed Labs: ordered. Radiology: ordered.   ***  {Document critical care time when appropriate:1} {Document review of labs and clinical decision tools ie heart score, Chads2Vasc2 etc:1}  {Document your independent review of radiology images, and any outside records:1} {Document your discussion with family members, caretakers, and with consultants:1} {Document social determinants of health affecting pt's care:1} {Document your decision making why or why not admission, treatments were needed:1} Final Clinical Impression(s) / ED Diagnoses Final diagnoses:  None    Rx / DC Orders ED Discharge Orders     None

## 2023-05-28 NOTE — ED Notes (Signed)
Called lab to add on urine culture to urine in lab

## 2023-05-28 NOTE — Discharge Instructions (Signed)
Your caregiver has seen you today because you are having problems with feelings of weakness, dizziness, and/or fatigue. Weakness has many different causes, some of which are common and others are very rare. Your caregiver has considered some of the most common causes of weakness and feels it is safe for you to go home and be observed. Not every illness or injury can be identified during an emergency department visit, thus follow-up with your primary healthcare provider is important. Medical conditions can also worsen, so it is also important to return immediately as directed below, or if you have other serious concerns develop.  RETURN IMMEDIATELY IF  you develop new shortness of breath, chest pain, fever, have difficulty moving parts of your body (new weakness, numbness, or incoordination), sudden change in speech, vision, swallowing, or understanding, faint or develop new dizziness, severe headache, become poorly responsive or have an altered mental status compared to baseline for you, new rash, abdominal pain, or bloody stools,  Return sooner also if you develop new problems for which you have not talked to your caregiver but you feel may be emergency medical conditions.

## 2023-05-29 ENCOUNTER — Telehealth: Payer: Self-pay | Admitting: Nurse Practitioner

## 2023-05-29 ENCOUNTER — Ambulatory Visit (INDEPENDENT_AMBULATORY_CARE_PROVIDER_SITE_OTHER): Payer: PPO | Admitting: Nurse Practitioner

## 2023-05-29 ENCOUNTER — Encounter: Payer: Self-pay | Admitting: Nurse Practitioner

## 2023-05-29 VITALS — BP 120/72 | HR 88 | Temp 96.7°F | Ht 61.0 in | Wt 145.2 lb

## 2023-05-29 DIAGNOSIS — G8929 Other chronic pain: Secondary | ICD-10-CM

## 2023-05-29 DIAGNOSIS — M5440 Lumbago with sciatica, unspecified side: Secondary | ICD-10-CM

## 2023-05-29 DIAGNOSIS — R42 Dizziness and giddiness: Secondary | ICD-10-CM

## 2023-05-29 DIAGNOSIS — G20A1 Parkinson's disease without dyskinesia, without mention of fluctuations: Secondary | ICD-10-CM | POA: Diagnosis not present

## 2023-05-29 DIAGNOSIS — F3341 Major depressive disorder, recurrent, in partial remission: Secondary | ICD-10-CM

## 2023-05-29 DIAGNOSIS — I951 Orthostatic hypotension: Secondary | ICD-10-CM

## 2023-05-29 LAB — URINE CULTURE: Culture: 20000 — AB

## 2023-05-29 NOTE — Telephone Encounter (Signed)
Patient called requesting referral to neurologist ([facility]):  Dr. Jaquita Folds at [facility] (ph: [phone], fax: [phone]).

## 2023-05-29 NOTE — Progress Notes (Unsigned)
Careteam: Patient Care Team: [name], NP as PCP - General (Geriatric Medicine) Corky Crafts, MD as PCP - Cardiology (Cardiology) Buck Mam, LCSW as Social Worker  PLACE OF SERVICE:  [facility]  Advanced Directive information    Allergies  Allergen Reactions   Codeine Other (See Comments)    Nausea     Chief Complaint  Patient presents with   Acute Visit    Patient presents for low bp concerns while standing.     HPI: Patient is a 77 y.o. female for follow up.  She has been feeling bad for several weeks.  She called an ambulance because she felt so bad and went to the ED but did not feel like they helped her.  She went to [facility] the next day- she reports she was not very nice because she had to wait for 12 hours.  Isolated away and had EKG and blood pressure cuff going off but no one was checking on her  She is falling a lot. Her son is staying with her.  She is now wheeling herself around on a chair at home Waiting on a walker.  Drinking propel and liquid IV to stay hydrated.   She is keeping up with neurologist in [place]- wanted to go to [facility].   She is not currently dizzy  She has compression hose but not wearing today reports she has a hard time placing them on her. She has also gotten the ones with zipper.   She thought her lyica was contributing and stopped taking but unsure if she stopped before or after she really starting having symptoms.   She is not taking her lisinopril.   Plans to call the senior center to get information about having someone come help her in the morning.  Review of Systems:  Review of Systems  Constitutional:  Negative for chills, fever and weight loss.  HENT:  Negative for tinnitus.   Respiratory:  Negative for cough, sputum production and shortness of breath.   Cardiovascular:  Negative for chest pain, palpitations and leg swelling.  Gastrointestinal:  Negative for  abdominal pain, constipation, diarrhea and heartburn.  Genitourinary:  Negative for dysuria, frequency and urgency.  Musculoskeletal:  Negative for back pain, falls, joint pain and myalgias.  Skin: Negative.   Neurological:  Positive for dizziness. Negative for headaches.  Psychiatric/Behavioral:  Negative for depression and memory loss. The patient is nervous/anxious. The patient does not have insomnia.     Past Medical History:  Diagnosis Date   Anxiety    Basal cell carcinoma    Depression    Hypertension    Neuromuscular disorder (HCC)    Osteoarthritis    Osteopenia    Parkinson disease (HCC)    Pneumonia    Tremors of nervous system    legs   uterine ca 10/08/2021   also endometrial cancer   Past Surgical History:  Procedure Laterality Date   CARPAL TUNNEL RELEASE Bilateral 1996/1997   CYSTOSCOPY N/A 10/08/2021   Procedure: CYSTOSCOPY;  Surgeon: Carver Fila, MD;  Location: [facility];  Service: Gynecology;  Laterality: N/A;   ROBOTIC ASSISTED TOTAL HYSTERECTOMY WITH BILATERAL SALPINGO OOPHERECTOMY N/A 10/08/2021   Procedure: XI ROBOTIC ASSISTED TOTAL HYSTERECTOMY WITH BILATERAL SALPINGO OOPHORECTOMY;SENTINEL LYMPH NODE INJECTION;  Surgeon: Carver Fila, MD;  Location: [facility];  Service: Gynecology;  Laterality: N/A;   SQUAMOUS CELL CARCINOMA EXCISION Left 2017   Left jawline   TUBAL LIGATION  6045'W   Social History:   reports that she has never smoked. She has never been exposed to tobacco smoke. She has never used smokeless tobacco. She reports that she does not currently use alcohol. She reports that she does not currently use drugs.  Family History  Problem Relation Age of Onset   Endometrial cancer Mother    Diabetes Mother    Alzheimer's disease Father    Coronary artery disease Father    Diabetes Sister    COPD Sister    Diabetes Sister    Heart disease Brother    Other Brother        MVA    Parkinson's disease Brother    Heart disease Brother     Alcohol abuse Brother    Heart disease Brother    Parkinson's disease Maternal Aunt    Breast cancer Maternal Aunt    Colon cancer Paternal Aunt    Parkinson's disease Paternal Aunt    Lung cancer Paternal Uncle    Healthy Son    Healthy Son    Prostate cancer Neg Hx    Pancreatic cancer Neg Hx    Ovarian cancer Neg Hx     Medications: Patient's Medications  New Prescriptions   No medications on file  Previous Medications   ACETAMINOPHEN (TYLENOL PO)    Take 1 tablet by mouth as needed.   CARBIDOPA-LEVODOPA (SINEMET IR) 25-100 MG TABLET    Take 2 tablets by mouth 5 (five) times daily.   ESCITALOPRAM (LEXAPRO) 5 MG TABLET    Take 5 mg by mouth daily.   LISINOPRIL (ZESTRIL) 10 MG TABLET    Take 10 mg by mouth at bedtime as needed (Take if blood pressure > 150/90 at bedtime.).   ONDANSETRON (ZOFRAN-ODT) 4 MG DISINTEGRATING TABLET    Take 1 tablet (4 mg total) by mouth every 8 (eight) hours as needed for nausea or vomiting.   PREGABALIN (LYRICA) 75 MG CAPSULE    TAKE 1 CAPSULE BY MOUTH TWICE A DAY  Modified Medications   No medications on file  Discontinued Medications   No medications on file    Physical Exam:  Vitals:   05/29/23 1343  BP: 120/72  Pulse: 88  Temp: (!) 96.7 F (35.9 C)  SpO2: 98%  Weight: 145 lb 3.2 oz (65.9 kg)  Height: 5\' 1"  (1.549 m)   Body mass index is 27.44 kg/m. Wt Readings from Last 3 Encounters:  05/29/23 145 lb 3.2 oz (65.9 kg)  05/26/23 145 lb (65.8 kg)  05/04/23 155 lb 12.8 oz (70.7 kg)    Physical Exam Constitutional:      General: She is not in acute distress.    Appearance: She is well-developed. She is not diaphoretic.  HENT:     Head: Normocephalic and atraumatic.     Mouth/Throat:     Pharynx: No oropharyngeal exudate.  Eyes:     Conjunctiva/sclera: Conjunctivae normal.     Pupils: Pupils are equal, round, and reactive to light.  Cardiovascular:     Rate and Rhythm: Normal rate and regular rhythm.     Heart sounds:  Normal heart sounds.  Pulmonary:     Effort: Pulmonary effort is normal.     Breath sounds: Normal breath sounds.  Abdominal:     General: Bowel sounds are normal.     Palpations: Abdomen is soft.  Musculoskeletal:     Cervical back: Normal range of motion and neck supple.     Right lower leg:  No edema.     Left lower leg: No edema.  Skin:    General: Skin is warm and dry.  Neurological:     Mental Status: She is alert.  Psychiatric:        Mood and Affect: Mood normal.   ***  Labs reviewed: Basic Metabolic Panel: Recent Labs    11/14/22 1427 05/26/23 1242 05/27/23 2329  NA 144 136 138  K 3.6 3.1* 3.4*  CL 104 102 102  CO2 31 26 25   GLUCOSE 127 101* 108*  BUN 13 7* 8  CREATININE 0.82 0.83 1.00  CALCIUM 9.7 8.8* 9.7   Liver Function Tests: Recent Labs    11/14/22 1427 05/26/23 1242  AST 12 9*  ALT 5* <5  ALKPHOS  --  79  BILITOT 0.5 1.1  PROT 6.2 6.4*  ALBUMIN  --  4.0   Recent Labs    05/26/23 1242  LIPASE 31   No results for input(s): "AMMONIA" in the last 8760 hours. CBC: Recent Labs    11/14/22 1427 05/26/23 1242 05/27/23 2329  WBC 5.8 4.9 4.6  NEUTROABS 3,608 3.6 2.9  HGB 12.9 13.6 13.6  HCT 38.0 39.9 38.8  MCV 91.6 88.7 86.2  PLT 249 205 228   Lipid Panel: Recent Labs    11/14/22 1427  CHOL 190  HDL 53  LDLCALC 86  TRIG 386*  CHOLHDL 3.6   TSH: No results for input(s): "TSH" in the last 8760 hours. A1C: No results found for: "HGBA1C"   Assessment/Plan There are no diagnoses linked to this encounter.  No follow-ups on file.: ***  Kyland No K. Biagio Borg [facility] [phone]

## 2023-05-29 NOTE — Progress Notes (Signed)
  This encounter was created in error - please disregard. No show 

## 2023-05-29 NOTE — Patient Instructions (Addendum)
Increase hydration- continue electrolyte drinks.  Make sure to eat three meals a day  To use compression hose during the day.

## 2023-05-29 NOTE — Telephone Encounter (Signed)
Patient called and states that her blood pressure is low and she's been in/out of the hospital. She thinks that this happened because she stopped taking her Lyrica. I requested for patient to take blood pressure while I was on the phone and she states that machine isnt working. Patient refused to go to ER and wants to be seen by PCP Janyth Contes, Janene Harvey, NP today. Message routed to PCP as FYI. Patient placed on afternoon schedule.

## 2023-05-30 ENCOUNTER — Telehealth (HOSPITAL_BASED_OUTPATIENT_CLINIC_OR_DEPARTMENT_OTHER): Payer: Self-pay | Admitting: *Deleted

## 2023-05-30 NOTE — Telephone Encounter (Signed)
Post ED Visit - Positive Culture Follow-up  Culture report reviewed by antimicrobial stewardship pharmacist: [facility] []  Enzo Bi, Pharm.D. []  Celedonio Miyamoto, Pharm.D., BCPS AQ-ID []  Garvin Fila, Pharm.D., BCPS []  Georgina Pillion, Pharm.D., BCPS []  [place], [address].D., BCPS, AAHIVP []  Estella Husk, Pharm.D., BCPS, AAHIVP []  Lysle Pearl, PharmD, BCPS []  Phillips Climes, PharmD, BCPS []  Agapito Games, PharmD, BCPS []  Verlan Friends, PharmD []  Mervyn Gay, PharmD, BCPS [x]  Doreen Beam, PharmD  [facility] []  Len Childs, PharmD []  Greer Pickerel, PharmD []  Adalberto Cole, PharmD []  Perlie Gold, Rph []  Lonell Face) Jean Rosenthal, PharmD []  Earl Many, PharmD []  Junita Push, PharmD []  Dorna Leitz, PharmD []  Terrilee Files, PharmD []  Lynann Beaver, PharmD []  Keturah Barre, PharmD []  Loralee Pacas, PharmD []  Bernadene Person, PharmD   Positive urine culture No treatment  is required at this time.  Ashley Neal 05/30/2023, 11:09 AM

## 2023-06-01 NOTE — Telephone Encounter (Signed)
Referral has been faxed to the location pt requested and she has been sent a Medical laboratory scientific officer.

## 2023-06-01 NOTE — Telephone Encounter (Signed)
done

## 2023-06-03 ENCOUNTER — Ambulatory Visit: Payer: Self-pay | Admitting: *Deleted

## 2023-06-04 DIAGNOSIS — M5440 Lumbago with sciatica, unspecified side: Secondary | ICD-10-CM | POA: Diagnosis not present

## 2023-06-04 DIAGNOSIS — Z8616 Personal history of COVID-19: Secondary | ICD-10-CM | POA: Diagnosis not present

## 2023-06-04 DIAGNOSIS — G20A1 Parkinson's disease without dyskinesia, without mention of fluctuations: Secondary | ICD-10-CM | POA: Diagnosis not present

## 2023-06-04 DIAGNOSIS — G709 Myoneural disorder, unspecified: Secondary | ICD-10-CM | POA: Diagnosis not present

## 2023-06-04 DIAGNOSIS — M8580 Other specified disorders of bone density and structure, unspecified site: Secondary | ICD-10-CM | POA: Diagnosis not present

## 2023-06-04 DIAGNOSIS — G8929 Other chronic pain: Secondary | ICD-10-CM | POA: Diagnosis not present

## 2023-06-04 DIAGNOSIS — F419 Anxiety disorder, unspecified: Secondary | ICD-10-CM | POA: Diagnosis not present

## 2023-06-04 DIAGNOSIS — I10 Essential (primary) hypertension: Secondary | ICD-10-CM | POA: Diagnosis not present

## 2023-06-04 DIAGNOSIS — M1990 Unspecified osteoarthritis, unspecified site: Secondary | ICD-10-CM | POA: Diagnosis not present

## 2023-06-04 DIAGNOSIS — F32A Depression, unspecified: Secondary | ICD-10-CM | POA: Diagnosis not present

## 2023-06-04 DIAGNOSIS — Z8542 Personal history of malignant neoplasm of other parts of uterus: Secondary | ICD-10-CM | POA: Diagnosis not present

## 2023-06-04 DIAGNOSIS — Z85828 Personal history of other malignant neoplasm of skin: Secondary | ICD-10-CM | POA: Diagnosis not present

## 2023-06-04 NOTE — Patient Instructions (Signed)
Visit Information  Thank you for taking time to visit with me today. Please don't hesitate to contact me if I can be of assistance to you.   Following are the goals we discussed today:   Goals Addressed               This Visit's Progress     Receive Counseling & Supportive Services to Reduce & Manage Symptoms of Anxiety & Depression. (pt-stated)         Activities and task to complete in order to accomplish goals.   Call your insurance provider for more information about your Enhanced Benefits as well as to speak with the Navigator for assistance  Review list of in-network providers and select and call to schedule your counseling appointment- as well as with Psych/Provider for medication management if desired. Begin higher dose of Lexapro prescribed for you today Begin HHPT services Continue to monitor and manage your Blood Pressure Consider [employer] for transportation  Start / continue relaxed breathing 3 times daily Keep all upcoming appointment discussed today Continue with compliance of taking medication prescribed by Doctor Self Support options    Call [phone] for behavioral health support by phone 24/7 Call 911 for emergent needs         Our next appointment is by telephone on 07/01/23   Please call the care guide team at [phone] if you need to cancel or reschedule your appointment.   If you are experiencing a Mental Health or Behavioral Health Crisis or need someone to talk to, please call the Suicide and Crisis Lifeline: 988 call 911   The patient verbalized understanding of instructions, educational materials, and care plan provided today and DECLINED offer to receive copy of patient instructions, educational materials, and care plan.   Telephone follow up appointment with care management team member scheduled for: 07/01/23  Reece Levy, MSW, LCSW [place]  [facility], [facility] Licensed Clinical Social Worker Care Coordinator   [phone]

## 2023-06-04 NOTE — Patient Outreach (Signed)
Care Coordination   Follow Up Visit Note   06/04/2023 Name: Ashley Neal MRN: [id number] DOB: 1946/04/23  Ashley Neal is a 77 y.o. year old female who sees Eubanks, Janene Harvey, NP for primary care. I spoke with  Ashley Neal by phone today.  What matters to the patients health and wellness today?  Been to the ER recently.    Goals Addressed               This Visit's Progress     Receive Counseling & Supportive Services to Reduce & Manage Symptoms of Anxiety & Depression. (pt-stated)         Activities and task to complete in order to accomplish goals.   Call your insurance provider for more information about your Enhanced Benefits as well as to speak with the Navigator for assistance  Review list of in-network providers and select and call to schedule your counseling appointment- as well as with Psych/Provider for medication management if desired. Begin higher dose of Lexapro prescribed for you today Begin HHPT services Continue to monitor and manage your Blood Pressure Consider [employer] for transportation  Start / continue relaxed breathing 3 times daily Keep all upcoming appointment discussed today Continue with compliance of taking medication prescribed by Doctor Self Support options    Call 988 for behavioral health support by phone 24/7 Call 911 for emergent needs         SDOH assessments and interventions completed:  Yes     Care Coordination Interventions:  Yes, provided  Interventions Today    Flowsheet Row Most Recent Value  Chronic Disease   Chronic disease during today's visit Other  [Parkinsons]  General Interventions   General Interventions Discussed/Reviewed Doctor Visits  Doctor Visits Discussed/Reviewed Specialist  [Pt planning to seek closer Neurologist]  Exercise Interventions   Exercise Discussed/Reviewed Physical Activity  [Pt will be receiving HHPT per report.]  Mental Health Interventions   Mental Health Discussed/Reviewed  Mental Health Discussed  [Pt has list of in-network counselor providers and has not had time to review and select.  Mentions concern for transportation to/from these visits and reminded her she can seek virtual for some.]  Pharmacy Interventions   Pharmacy Dicussed/Reviewed Pharmacy Topics Discussed  [Pt reports her Neurologist has increased her Lexapro dose today- she plans to get the new RX and begin taking it.]     .thnint  Follow up plan: Follow up call scheduled for 07/01/23    Encounter Outcome:  Patient Visit Completed

## 2023-06-05 ENCOUNTER — Telehealth: Payer: Self-pay

## 2023-06-05 NOTE — Telephone Encounter (Signed)
Ashley Neal from [facility] called back regarding patient and would like a guidline of what's too low or high for patient BP that should be notified to the PCP

## 2023-06-05 NOTE — Telephone Encounter (Signed)
Yes she has bad orthostatic hypotension and therapy needs to use precautions due to this.

## 2023-06-05 NOTE — Telephone Encounter (Signed)
[facility] called for Verbal orders for the patient.1 week 3, 2x for 2 weeks, and 1x for 2 weeks. Orders were given. Also states that the patient is lightheaded and dizzy. Supine BP 180/80 , sitting 130/78, standing 76/58. Phone number to reach out is [phone].

## 2023-06-05 NOTE — Telephone Encounter (Signed)
Attempted to call [facility]. LVM

## 2023-06-08 NOTE — Telephone Encounter (Signed)
Sbp  <70 or >170

## 2023-06-09 ENCOUNTER — Telehealth: Payer: Self-pay

## 2023-06-09 ENCOUNTER — Encounter: Payer: Self-pay | Admitting: Adult Health

## 2023-06-09 ENCOUNTER — Telehealth (INDEPENDENT_AMBULATORY_CARE_PROVIDER_SITE_OTHER): Payer: PPO | Admitting: Adult Health

## 2023-06-09 DIAGNOSIS — G20A1 Parkinson's disease without dyskinesia, without mention of fluctuations: Secondary | ICD-10-CM | POA: Diagnosis not present

## 2023-06-09 DIAGNOSIS — E876 Hypokalemia: Secondary | ICD-10-CM

## 2023-06-09 DIAGNOSIS — I951 Orthostatic hypotension: Secondary | ICD-10-CM

## 2023-06-09 NOTE — Telephone Encounter (Signed)
Patient called to request a video visit to discuss worsening of lightheadedness, heart racing, episodes of sweats, and having to sit down and take breaks in order to complete physical therapy. I strongly suggested patient be seen in person, patient stated it is impossible for her to drive and she does not have access to any transportation.   Patient stated she would like to have a video visit to further discuss. Appointment scheduled with Monina today at 2:20. Patient asked that I make her PCP aware of her concerns.

## 2023-06-09 NOTE — Progress Notes (Signed)
This service is provided via telemedicine  No vital signs collected/recorded due to the encounter was a telemedicine visit.   Location of patient (ex: home, work):  Home   Patient consents to a telephone visit:  Yes, 06/09/23   Location of the provider (ex: office, home):  [facility]  Name of any referring provider:  [name], NP   Names of all persons participating in the telemedicine service and their role in the encounter: Espn Zeman B/CMA, Monina C. Grayce Sessions, NP , and patient  Time spent on call:  11 minutes

## 2023-06-10 ENCOUNTER — Telehealth: Payer: Self-pay

## 2023-06-10 NOTE — Telephone Encounter (Signed)
Message left on clinical intake voicemail:   Patient called to express her dissatisfaction with her recent visit and asked if she can have a do over with her provider. Patient state she does not understand why she was told to come back in for labs when she had lads x 2 last week, patient then raised her voice stating " I'm tired of feeling bad.". Video visit scheduled for tomorrow with [name], NP

## 2023-06-11 ENCOUNTER — Encounter: Payer: Self-pay | Admitting: Nurse Practitioner

## 2023-06-11 ENCOUNTER — Telehealth: Payer: PPO | Admitting: Nurse Practitioner

## 2023-06-11 DIAGNOSIS — E876 Hypokalemia: Secondary | ICD-10-CM | POA: Diagnosis not present

## 2023-06-11 DIAGNOSIS — F3341 Major depressive disorder, recurrent, in partial remission: Secondary | ICD-10-CM | POA: Diagnosis not present

## 2023-06-11 DIAGNOSIS — I951 Orthostatic hypotension: Secondary | ICD-10-CM

## 2023-06-11 DIAGNOSIS — F419 Anxiety disorder, unspecified: Secondary | ICD-10-CM | POA: Diagnosis not present

## 2023-06-11 DIAGNOSIS — I959 Hypotension, unspecified: Secondary | ICD-10-CM | POA: Diagnosis not present

## 2023-06-11 DIAGNOSIS — G20A1 Parkinson's disease without dyskinesia, without mention of fluctuations: Secondary | ICD-10-CM | POA: Diagnosis not present

## 2023-06-11 NOTE — Telephone Encounter (Signed)
Noted saw patient

## 2023-06-11 NOTE — Progress Notes (Signed)
This service is provided via telemedicine  No vital signs collected/recorded due to the encounter was a telemedicine visit.   Location of patient (ex: home, work):  Home  Patient consents to a telephone visit:  Yes  Location of the provider (ex: office, home):  Office [place].   Name of any referring provider:  na  Names of all persons participating in the telemedicine service and their role in the encounter:  Ashley Neal, Patient, Nelda Severe, CMA, Abbey Chatters, NP  Time spent on call:  7:11

## 2023-06-11 NOTE — Telephone Encounter (Signed)
Return call to discuss patient BP

## 2023-06-11 NOTE — Progress Notes (Signed)
Careteam: Patient Care Team: [name], NP as PCP - General (Geriatric Medicine) Corky Crafts, MD as PCP - Cardiology (Cardiology) Buck Mam, LCSW as Social Worker  Advanced Directive information    Allergies  Allergen Reactions   Codeine Other (See Comments)    Nausea     Chief Complaint  Patient presents with   Acute Visit    Complains of Blood pressure being all over the place. Dizziness.      HPI: Patient is a 77 y.o. female for follow up  Laying down 182/82 then she sat up for a brief moment then stood up 76/59 This is how she has been for the last year.   She has had multiple falls over the last few weeks.  Working with therapist.  Needs a wheelchair -  Does not drive due to dizziness  She reached out to [place] neurologist.  Option was to reduce sinemet or start midodrine but could not lay down She does not like the option of not laying down but she likes to take naps and lay down to read books.  Neurologist does not feel like compression hose will help.  She has asked neurologist how to decrease sinemet to see if this helps.   Has home care coming in tomorrow for consultation  Does not know if she should stay in her home or start looking for facility for care.    Review of Systems:  Review of Systems  Constitutional:  Negative for chills, fever and weight loss.  HENT:  Negative for tinnitus.   Respiratory:  Negative for cough, sputum production and shortness of breath.   Cardiovascular:  Negative for chest pain, palpitations and leg swelling.  Gastrointestinal:  Negative for abdominal pain, constipation, diarrhea and heartburn.  Genitourinary:  Negative for dysuria, frequency and urgency.  Musculoskeletal:  Negative for back pain, falls, joint pain and myalgias.  Skin: Negative.   Neurological:  Positive for dizziness and weakness. Negative for headaches.  Psychiatric/Behavioral:  Positive for depression. Negative for  memory loss. The patient does not have insomnia.     Past Medical History:  Diagnosis Date   Anxiety    Basal cell carcinoma    Depression    Hypertension    Neuromuscular disorder (HCC)    Osteoarthritis    Osteopenia    Parkinson disease (HCC)    Pneumonia    Tremors of nervous system    legs   uterine ca 10/08/2021   also endometrial cancer   Past Surgical History:  Procedure Laterality Date   CARPAL TUNNEL RELEASE Bilateral 1996/1997   CYSTOSCOPY N/A 10/08/2021   Procedure: CYSTOSCOPY;  Surgeon: Carver Fila, MD;  Location: [facility];  Service: Gynecology;  Laterality: N/A;   ROBOTIC ASSISTED TOTAL HYSTERECTOMY WITH BILATERAL SALPINGO OOPHERECTOMY N/A 10/08/2021   Procedure: XI ROBOTIC ASSISTED TOTAL HYSTERECTOMY WITH BILATERAL SALPINGO OOPHORECTOMY;SENTINEL LYMPH NODE INJECTION;  Surgeon: Carver Fila, MD;  Location: [facility];  Service: Gynecology;  Laterality: N/A;   SQUAMOUS CELL CARCINOMA EXCISION Left 2017   Left jawline   TUBAL LIGATION  1980's   Social History:   reports that she has never smoked. She has never been exposed to tobacco smoke. She has never used smokeless tobacco. She reports that she does not currently use alcohol. She reports that she does not currently use drugs.  Family History  Problem Relation Age of Onset   Endometrial cancer Mother    Diabetes Mother  Alzheimer's disease Father    Coronary artery disease Father    Diabetes Sister    COPD Sister    Diabetes Sister    Heart disease Brother    Other Brother        MVA    Parkinson's disease Brother    Heart disease Brother    Alcohol abuse Brother    Heart disease Brother    Parkinson's disease Maternal Aunt    Breast cancer Maternal Aunt    Colon cancer Paternal Aunt    Parkinson's disease Paternal Aunt    Lung cancer Paternal Uncle    Healthy Son    Healthy Son    Prostate cancer Neg Hx    Pancreatic cancer Neg Hx    Ovarian cancer Neg Hx      Medications: Patient's Medications  New Prescriptions   No medications on file  Previous Medications   ACETAMINOPHEN (TYLENOL PO)    Take 1 tablet by mouth as needed.   CARBIDOPA-LEVODOPA (SINEMET IR) 25-100 MG TABLET    Take 2 tablets by mouth 5 (five) times daily.   ESCITALOPRAM (LEXAPRO) 20 MG TABLET    Take 20 mg by mouth daily.   ONDANSETRON (ZOFRAN-ODT) 4 MG DISINTEGRATING TABLET    Take 1 tablet (4 mg total) by mouth every 8 (eight) hours as needed for nausea or vomiting.  Modified Medications   No medications on file  Discontinued Medications   No medications on file    Physical Exam:  There were no vitals filed for this visit. There is no height or weight on file to calculate BMI. Wt Readings from Last 3 Encounters:  05/29/23 145 lb 3.2 oz (65.9 kg)  05/26/23 145 lb (65.8 kg)  05/04/23 155 lb 12.8 oz (70.7 kg)    Physical Exam Constitutional:      Appearance: Normal appearance.  Pulmonary:     Effort: Pulmonary effort is normal.  Neurological:     Mental Status: She is alert. Mental status is at baseline.  Psychiatric:        Mood and Affect: Mood normal.     Labs reviewed: Basic Metabolic Panel: Recent Labs    11/14/22 1427 05/26/23 1242 05/27/23 2329  NA 144 136 138  K 3.6 3.1* 3.4*  CL 104 102 102  CO2 31 26 25   GLUCOSE 127 101* 108*  BUN 13 7* 8  CREATININE 0.82 0.83 1.00  CALCIUM 9.7 8.8* 9.7   Liver Function Tests: Recent Labs    11/14/22 1427 05/26/23 1242  AST 12 9*  ALT 5* <5  ALKPHOS  --  79  BILITOT 0.5 1.1  PROT 6.2 6.4*  ALBUMIN  --  4.0   Recent Labs    05/26/23 1242  LIPASE 31   No results for input(s): "AMMONIA" in the last 8760 hours. CBC: Recent Labs    11/14/22 1427 05/26/23 1242 05/27/23 2329  WBC 5.8 4.9 4.6  NEUTROABS 3,608 3.6 2.9  HGB 12.9 13.6 13.6  HCT 38.0 39.9 38.8  MCV 91.6 88.7 86.2  PLT 249 205 228   Lipid Panel: Recent Labs    11/14/22 1427  CHOL 190  HDL 53  LDLCALC 86  TRIG 386*   CHOLHDL 3.6   TSH: No results for input(s): "TSH" in the last 8760 hours. A1C: No results found for: "HGBA1C"   Assessment/Plan 1. Orthostatic hypotension -unable to stand up due to severe orthostatic hypotension Neurologist recommended reduction in sinemet  s going on midodrine and  not laying down (which is not an option for her) -working to see what recommendation is for reduction of sinemet -supportive care encouraged -PT will access for [facility] and will write order based on recommendations.   2. Parkinson's disease, unspecified whether dyskinesia present, unspecified whether manifestations fluctuate (HCC) -plans to discuss decrease in sinemet- hopefully able to tolerate.   3. Recurrent major depressive disorder, in partial remission (HCC) Ongoing due to progressive symptoms of parkinson's disease Recent increase in lexapro. Will continue to monitor.   Janene Harvey. Biagio Borg  [facility] [phone]    Virtual Visit via video  I connected with patient on 06/11/23 at 11:00 AM EDT by mychart and verified that I am speaking with the correct person using two identifiers.  Location: Patient: home  Provider: [facility]    I discussed the limitations, risks, security and privacy concerns of performing an evaluation and management service by telephone and the availability of in person appointments. I also discussed with the patient that there may be a patient responsible charge related to this service. The patient expressed understanding and agreed to proceed.   I discussed the assessment and treatment plan with the patient. The patient was provided an opportunity to ask questions and all were answered. The patient agreed with the plan and demonstrated an understanding of the instructions.   The patient was advised to call back or seek an in-person evaluation if the symptoms worsen or if the condition fails to improve as anticipated.  I provided 20  minutes of non-face-to-face time during this encounter.  Janene Harvey. Biagio Borg Avs printed and mailed

## 2023-06-14 MED ORDER — POTASSIUM CHLORIDE CRYS ER 20 MEQ PO TBCR
40.0000 meq | EXTENDED_RELEASE_TABLET | Freq: Every day | ORAL | 0 refills | Status: DC
Start: 2023-06-14 — End: 2023-06-17

## 2023-06-14 MED ORDER — POTASSIUM CHLORIDE CRYS ER 20 MEQ PO TBCR
40.0000 meq | EXTENDED_RELEASE_TABLET | Freq: Every day | ORAL | 3 refills | Status: DC
Start: 2023-06-14 — End: 2023-06-14

## 2023-06-15 DIAGNOSIS — F4323 Adjustment disorder with mixed anxiety and depressed mood: Secondary | ICD-10-CM | POA: Diagnosis not present

## 2023-06-16 ENCOUNTER — Telehealth: Payer: Self-pay

## 2023-06-16 NOTE — Telephone Encounter (Signed)
Below is a response from Monina:  Medina-Vargas, Monina C, NP  YouJust now (9:48 AM)    Called Ms. Eccleston again and discussed her K and KCL supplementation X 2 days. She said that she'll think it over if she will take her K or not. She thinks it is her Parkinson's medication that is causing her K to go down and will discuss with her neurologist.  Avanell Shackleton

## 2023-06-16 NOTE — Telephone Encounter (Signed)
Patient states she just got off the phone with Kenard Gower, NP and they were discussing starting potassium. Patient states she has parkinson and is taking dopamine which lowers your potassium, Patients parkinson is managed by another provider, outside of [facility] and patient plans to follow-up with them soon for a dose change in her dopamine.  Patient has concluded that she does not want to take potassium at this time and states she is available if Monina would like to call her back to further discuss

## 2023-06-17 ENCOUNTER — Other Ambulatory Visit: Payer: Self-pay

## 2023-06-17 ENCOUNTER — Encounter: Payer: Self-pay | Admitting: Gynecologic Oncology

## 2023-06-17 DIAGNOSIS — E876 Hypokalemia: Secondary | ICD-10-CM

## 2023-06-17 NOTE — Telephone Encounter (Signed)
Patient called back and wanted to know if she can have lab done this Friday 06/19/2023. She also wanted to know if she could eat before blood work. I spoke with Ella Bodo, NP and she confirmed that patient can do both. I called patient back and she didn't answer. Voicemail was left with office call and lab order released. Message routed to both parties involved in patient care just incase patient calls back.

## 2023-06-17 NOTE — Telephone Encounter (Addendum)
Patient called back and was informed of providers response to her questions

## 2023-06-19 ENCOUNTER — Other Ambulatory Visit: Payer: PPO

## 2023-06-19 ENCOUNTER — Inpatient Hospital Stay: Payer: PPO | Attending: Gynecologic Oncology | Admitting: Gynecologic Oncology

## 2023-06-19 VITALS — BP 97/74 | HR 80 | Temp 97.6°F | Resp 20 | Ht 61.0 in | Wt 145.0 lb

## 2023-06-19 DIAGNOSIS — Z8542 Personal history of malignant neoplasm of other parts of uterus: Secondary | ICD-10-CM | POA: Diagnosis not present

## 2023-06-19 DIAGNOSIS — Z923 Personal history of irradiation: Secondary | ICD-10-CM | POA: Insufficient documentation

## 2023-06-19 DIAGNOSIS — Z90710 Acquired absence of both cervix and uterus: Secondary | ICD-10-CM | POA: Insufficient documentation

## 2023-06-19 DIAGNOSIS — R5383 Other fatigue: Secondary | ICD-10-CM | POA: Insufficient documentation

## 2023-06-19 DIAGNOSIS — Z90722 Acquired absence of ovaries, bilateral: Secondary | ICD-10-CM | POA: Diagnosis not present

## 2023-06-19 DIAGNOSIS — I959 Hypotension, unspecified: Secondary | ICD-10-CM | POA: Diagnosis not present

## 2023-06-19 DIAGNOSIS — R55 Syncope and collapse: Secondary | ICD-10-CM | POA: Insufficient documentation

## 2023-06-19 DIAGNOSIS — R42 Dizziness and giddiness: Secondary | ICD-10-CM | POA: Diagnosis not present

## 2023-06-19 DIAGNOSIS — C541 Malignant neoplasm of endometrium: Secondary | ICD-10-CM

## 2023-06-19 DIAGNOSIS — E876 Hypokalemia: Secondary | ICD-10-CM | POA: Diagnosis not present

## 2023-06-19 NOTE — Patient Instructions (Signed)
It was good seeing you today. No concerning findings on pelvic examination today.   Plan to follow up in three months with Dr. Pricilla Holm or sooner if needed.   Symptoms to report to your health care team include vaginal bleeding, rectal bleeding, bloating, weight loss without effort, new and persistent pain, new and  persistent fatigue, new leg swelling, new masses (i.e., bumps in your neck or groin), new and persistent cough, new and persistent nausea and vomiting, change in bowel or bladder habits, and any other concerns.

## 2023-06-19 NOTE — Progress Notes (Unsigned)
Patient is to the office for continued.  For the past month that she has been dealing with hypotension and dizziness.  She has seen several providers for this.  She states she has lost consciousness 4 times in the last 3 weeks.  She states she feels tired.  Decreased appetite due to not feeling hungry.  But this past week she has tried halfing the dose of her meds and has noticed no difference in her dizziness.  She recently went to the ER and had a CT of the head on May 27, 2023 she states she drinks 2 propels a day and also tries to drink water.  She was taking a caffeine pill and drinking tea in the morning to help with your blood pressure with little relief.  Abdominal pain or abnormal bloating reported.  No issues voiced with healed incisions.  Bladder is functioning at baseline.  No vaginal bleeding or discharge.  Bowels are functioning at baseline with no blood noted in stools.  She does have to take MiraLAX intermittently.  No pain or new changes in lower extremity edema.  She states she has been told in the past from providers to wear compression stockings.  A follow-up appointment with her PCP in the beginning of next week to follow-up on the low potassium level.  On her intake review of systems she no new symptoms reported but form is not been filled out.  Is challenging with her Parkinson's specialist being at [facility] and having challenges with getting transportation since she does not drive.  Exam normal. Soft stool in rectum.

## 2023-06-20 LAB — BASIC METABOLIC PANEL WITH GFR
BUN/Creatinine Ratio: 7 (calc) (ref 6–22)
BUN: 7 mg/dL (ref 7–25)
CO2: 28 mmol/L (ref 20–32)
Calcium: 10.4 mg/dL (ref 8.6–10.4)
Chloride: 105 mmol/L (ref 98–110)
Creat: 1.03 mg/dL — ABNORMAL HIGH (ref 0.60–1.00)
Glucose, Bld: 137 mg/dL (ref 65–139)
Potassium: 4.4 mmol/L (ref 3.5–5.3)
Sodium: 140 mmol/L (ref 135–146)
eGFR: 56 mL/min/{1.73_m2} — ABNORMAL LOW (ref >=60)

## 2023-06-22 ENCOUNTER — Telehealth: Payer: Self-pay | Admitting: *Deleted

## 2023-06-22 IMAGING — DX DG HIP (WITH OR WITHOUT PELVIS) 2-3V*L*
2 series · 2 of 2 positions shown · non-contrast
Comparison: None.

CLINICAL DATA: Left hip pain.

EXAM:
DG HIP (WITH OR WITHOUT PELVIS) 2-3V LEFT

[dg hip unilat w or w/o pelvis 2-3 views  (1 of 2)]
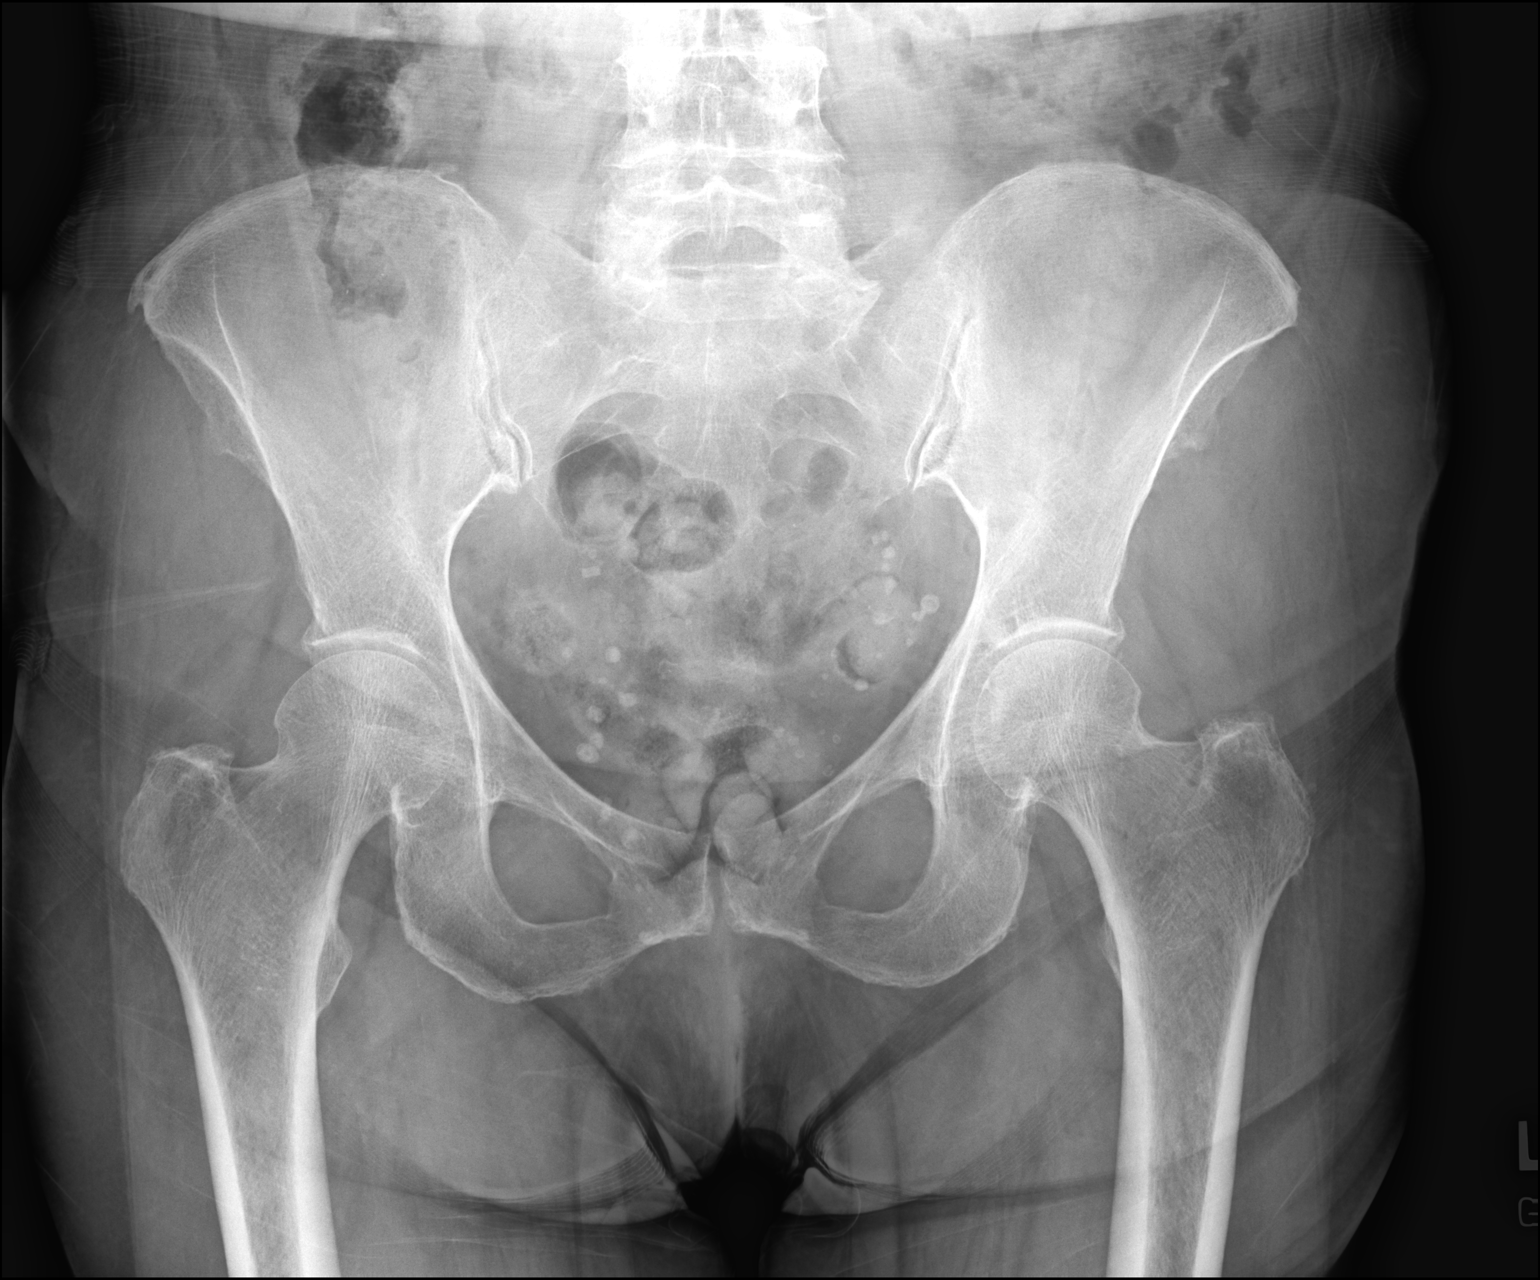

[dg hip unilat w or w/o pelvis 2-3 views  (2 of 2)]
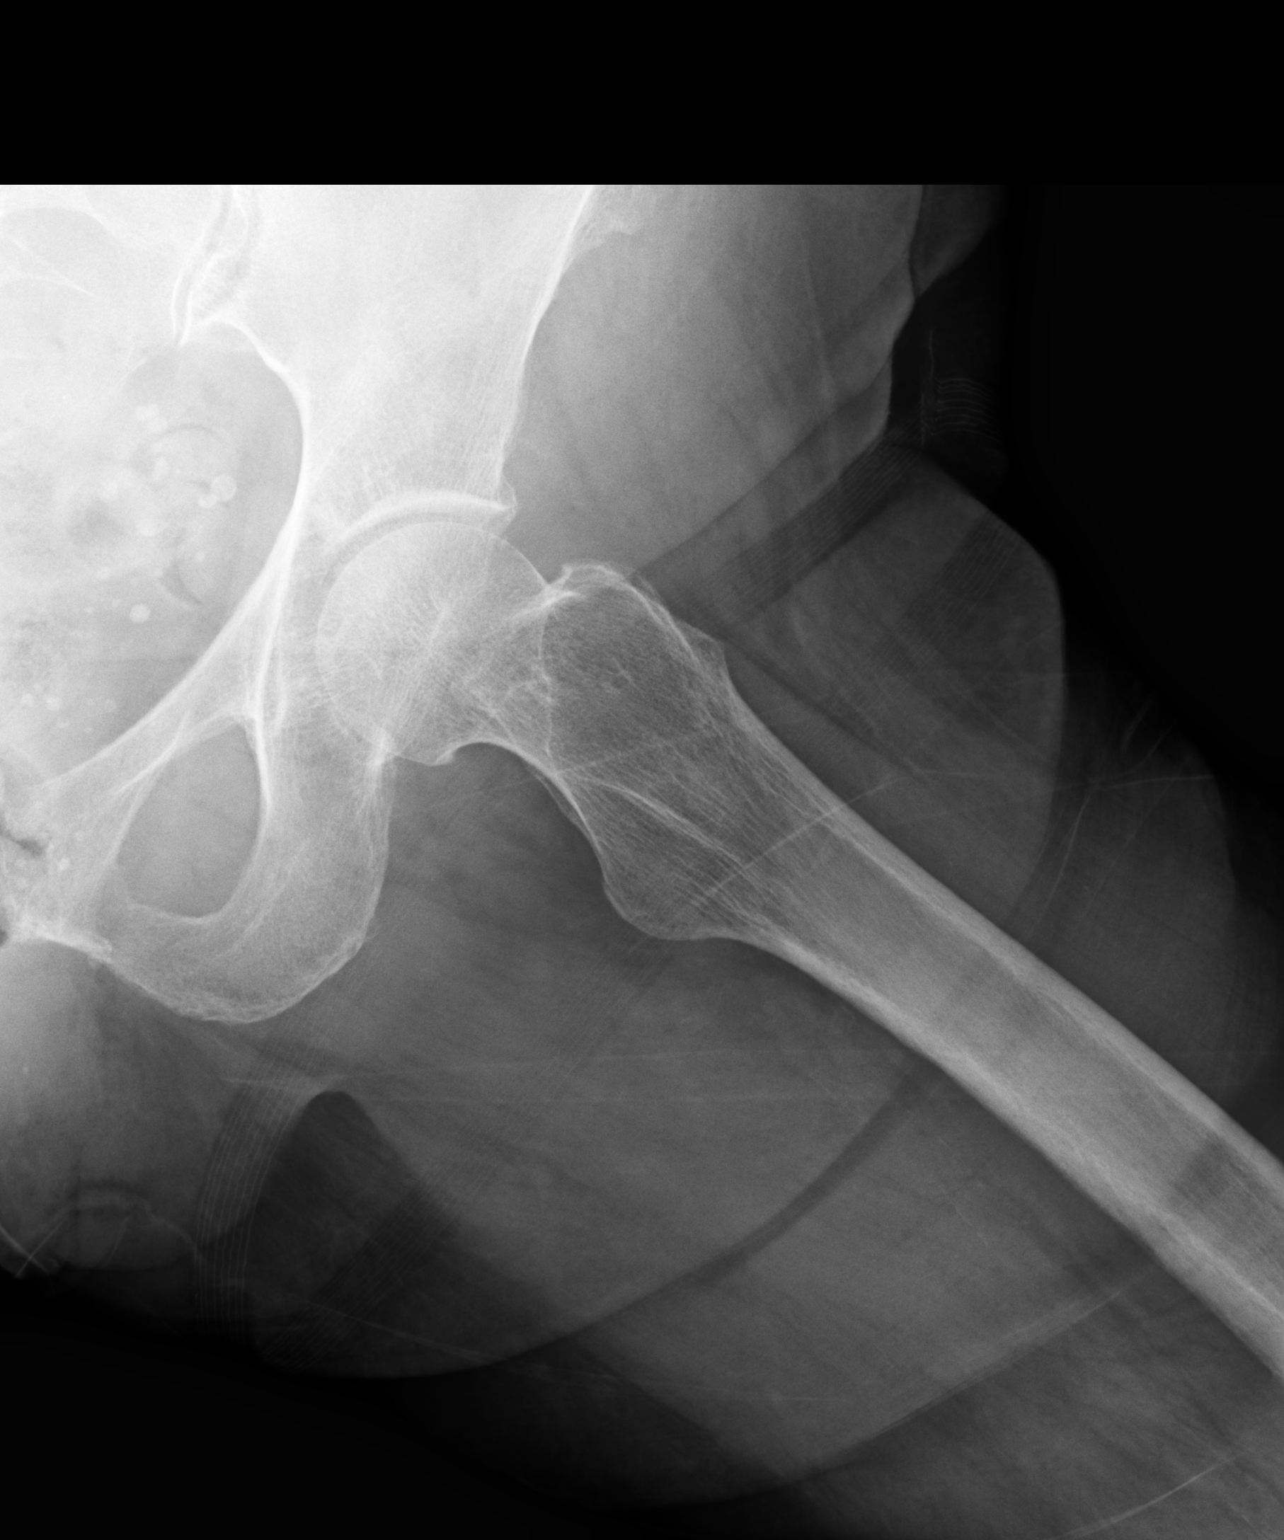

[2 of 2 positions shown; findings below may reference images not displayed]

FINDINGS: There is no acute fracture or dislocation. Mild osteopenia. No
significant arthritic changes. The soft tissues are unremarkable.
IMPRESSION: Negative.

## 2023-06-22 IMAGING — DX DG LUMBAR SPINE 2-3V
3 series · 3 of 3 positions shown · non-contrast
Comparison: None.

CLINICAL DATA: Chronic low back pain radiating to the left buttock
and groin.

EXAM:
LUMBAR SPINE - 2-3 VIEW

[dg lumbar spine 2-3 views (1 of 3)]
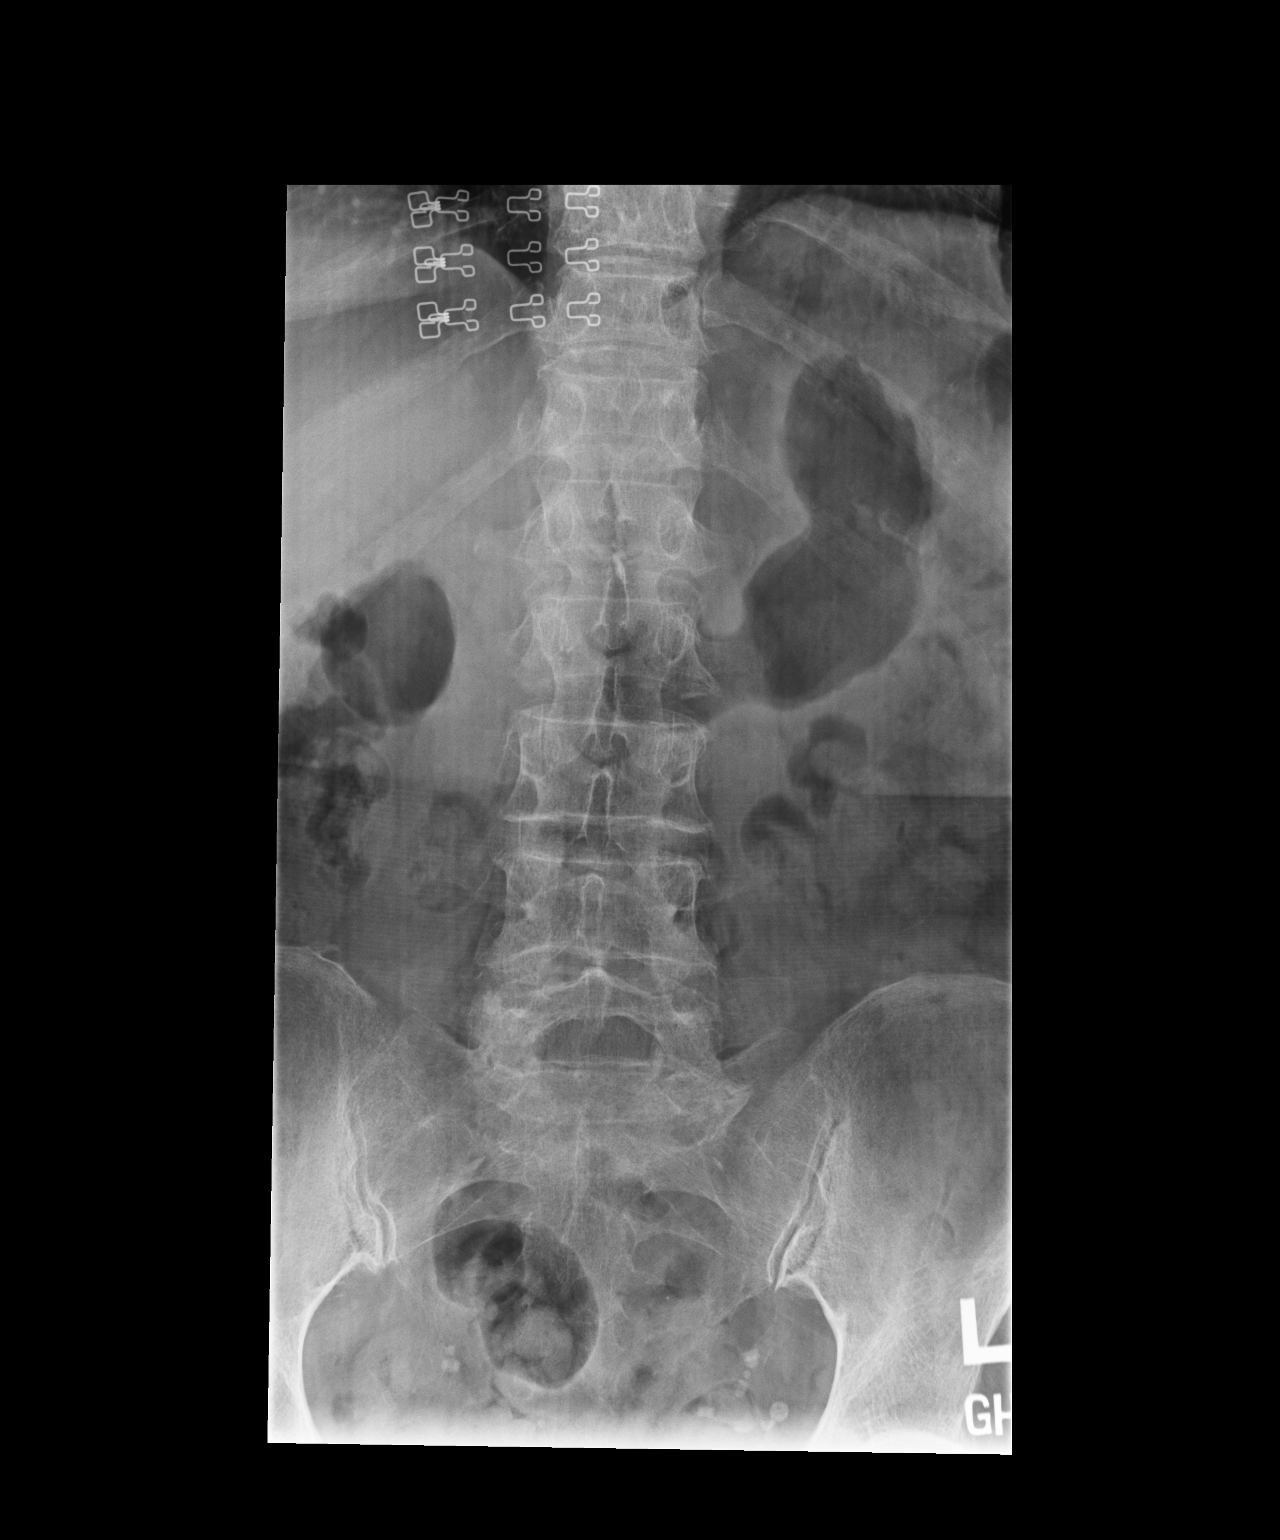

[dg lumbar spine 2-3 views (2 of 3)]
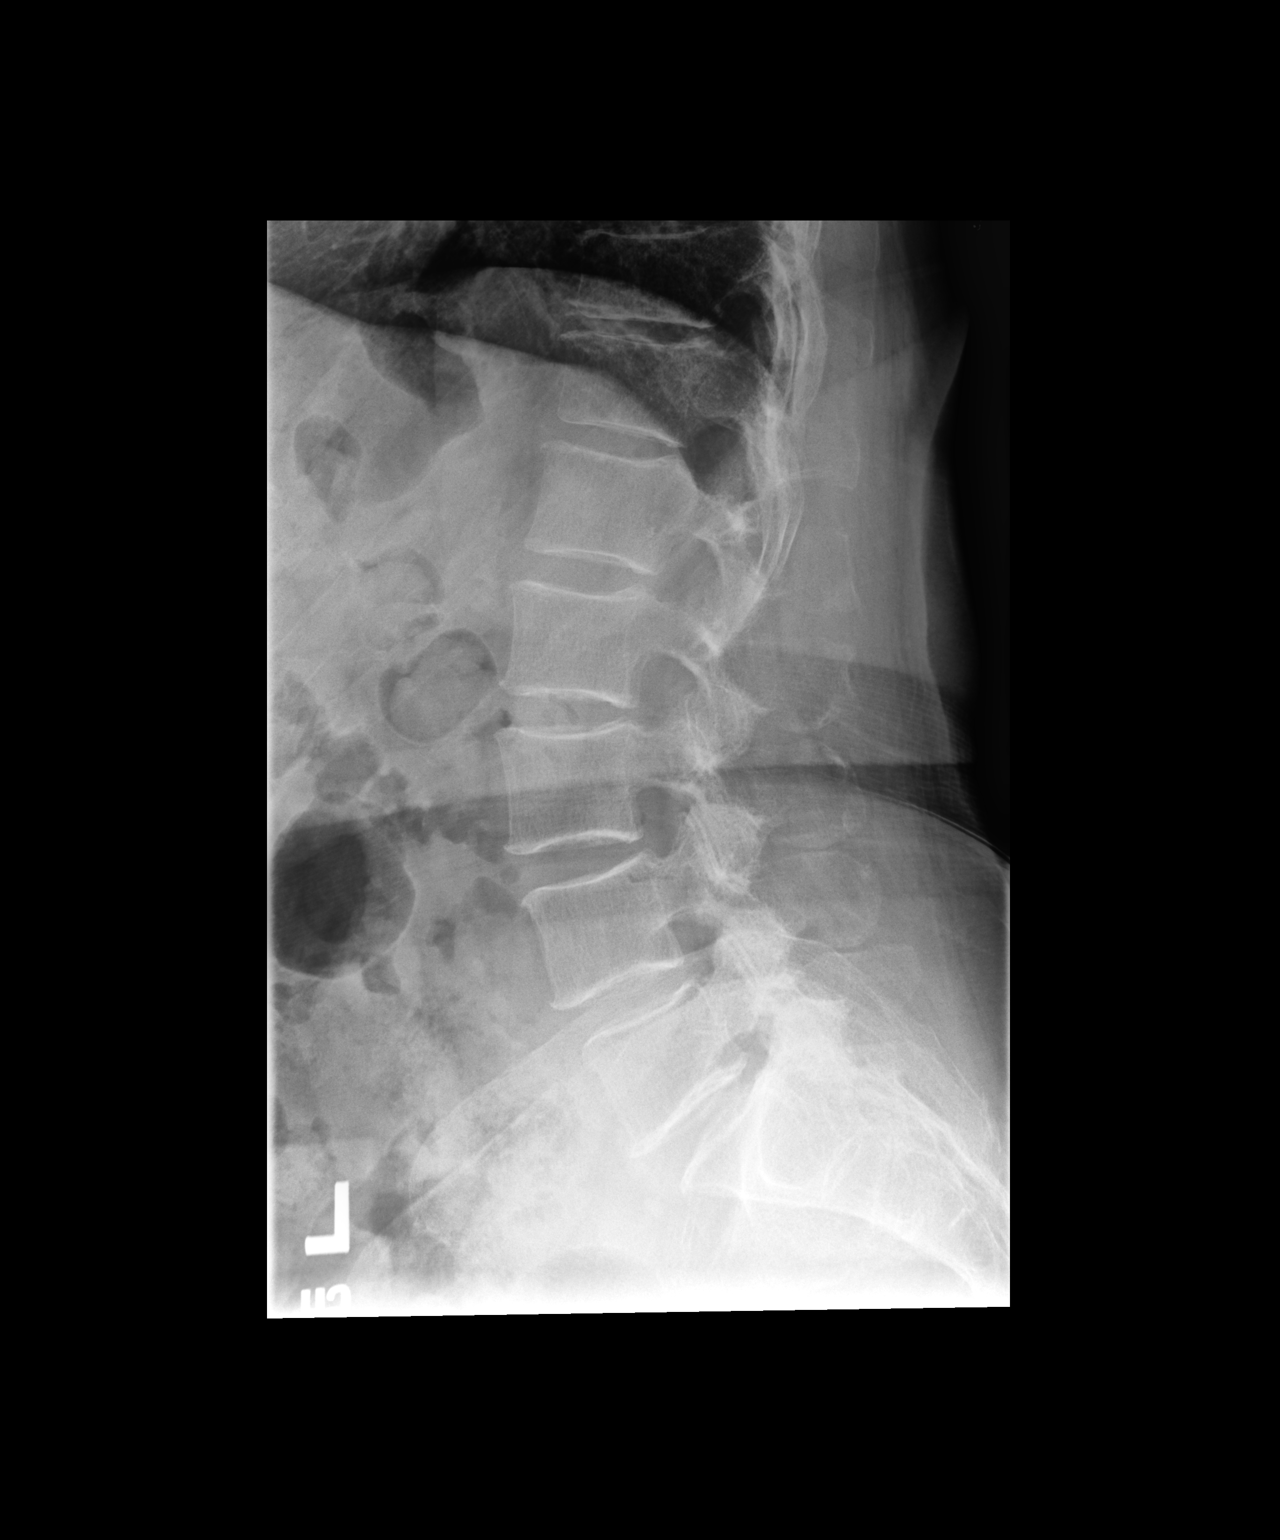

[dg lumbar spine 2-3 views (3 of 3)]
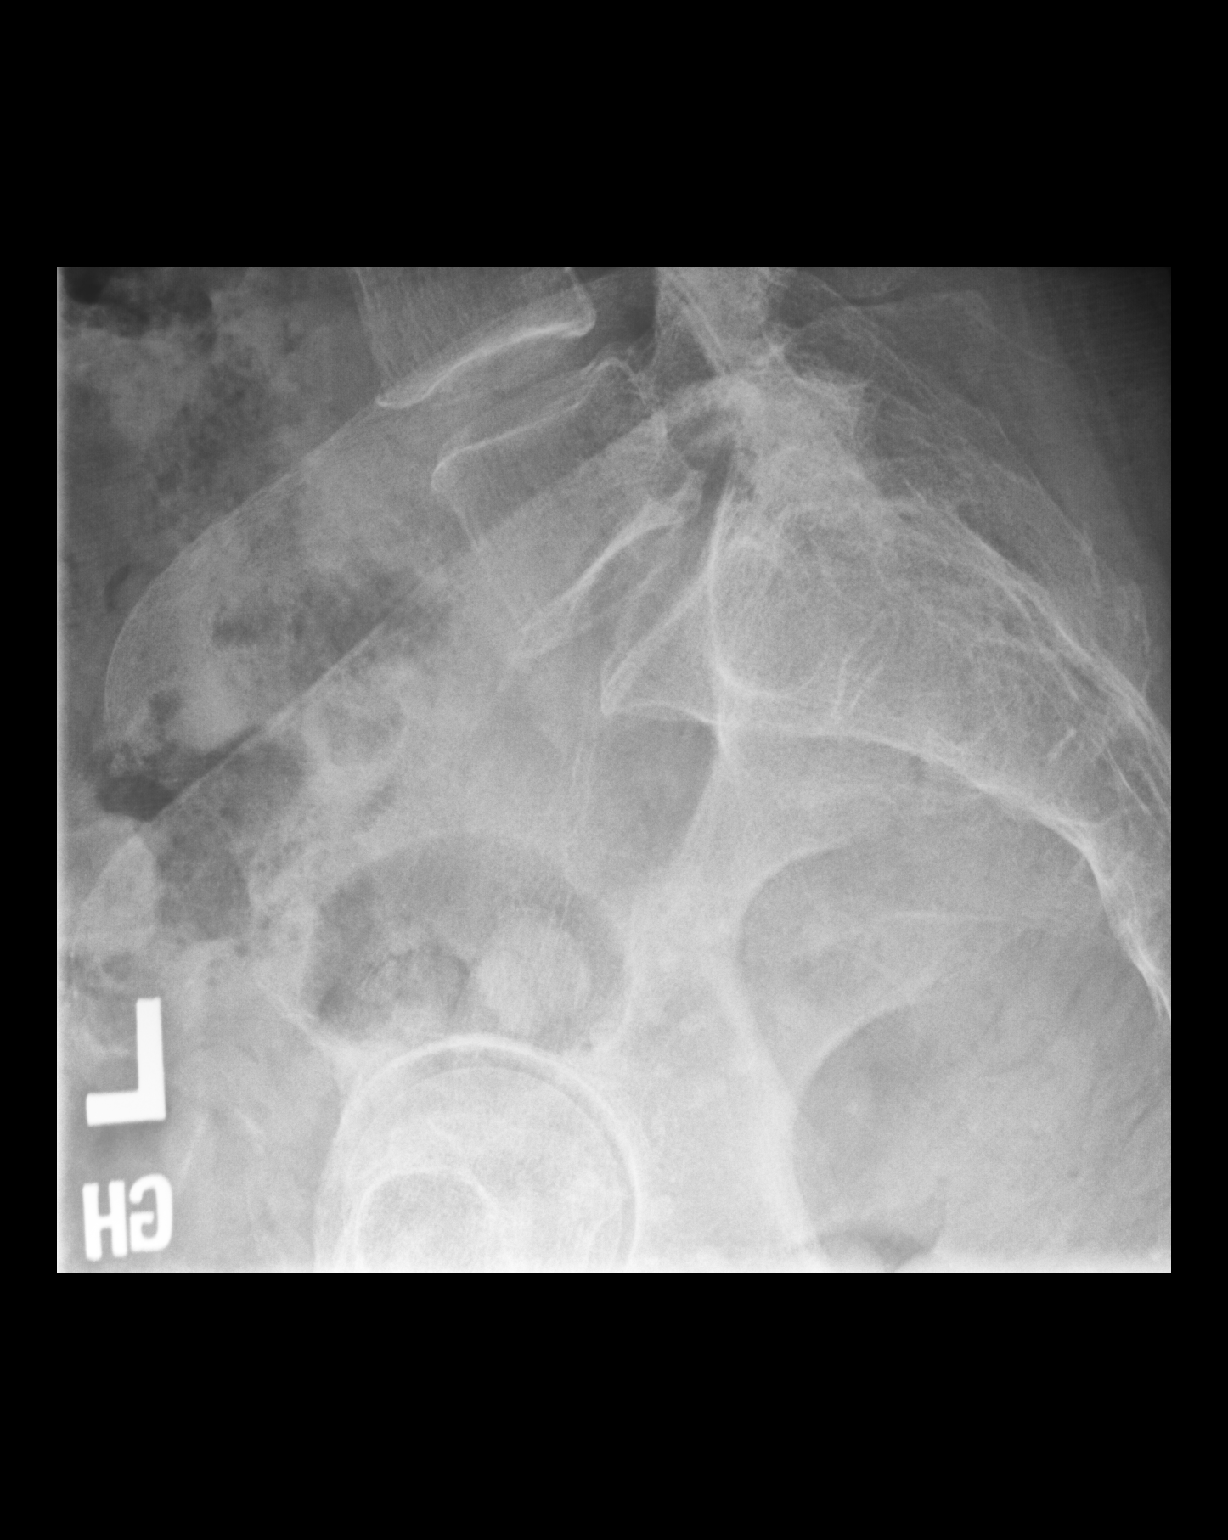

[3 of 3 positions shown; findings below may reference images not displayed]

FINDINGS: Five lumbar type vertebral bodies.

No acute fracture or subluxation. Vertebral body heights are
preserved.

Alignment is normal.

Intervertebral disc spaces are maintained.

Lower lumbar facet arthropathy.
IMPRESSION: 1. Lower lumbar facet arthropathy.

## 2023-06-22 NOTE — Telephone Encounter (Signed)
Medina-Vargas, Monina C, NP  You37 minutes ago (4:17 PM)    K 4.4, now normal, up from 3.0 (06/11/23. Very Good!    Patient Notified and agreed.

## 2023-06-22 NOTE — Telephone Encounter (Signed)
Patient called requesting the results from the [facility] that she had done on Friday.   Please Advise.

## 2023-06-30 ENCOUNTER — Telehealth (INDEPENDENT_AMBULATORY_CARE_PROVIDER_SITE_OTHER): Payer: PPO | Admitting: Nurse Practitioner

## 2023-06-30 ENCOUNTER — Encounter: Payer: Self-pay | Admitting: Nurse Practitioner

## 2023-06-30 DIAGNOSIS — G20A1 Parkinson's disease without dyskinesia, without mention of fluctuations: Secondary | ICD-10-CM | POA: Diagnosis not present

## 2023-06-30 DIAGNOSIS — I951 Orthostatic hypotension: Secondary | ICD-10-CM | POA: Diagnosis not present

## 2023-06-30 DIAGNOSIS — E876 Hypokalemia: Secondary | ICD-10-CM

## 2023-06-30 NOTE — Progress Notes (Signed)
Careteam: Patient Care Team: [name], NP as PCP - General (Geriatric Medicine) Corky Crafts, MD as PCP - Cardiology (Cardiology) Buck Mam, LCSW as Social Worker  Advanced Directive information    Allergies  Allergen Reactions   Codeine Other (See Comments)    Nausea     Chief Complaint  Patient presents with   Acute Visit    Discuss potassium and heart related concerns. Discuss referral for parkinson specialist.      HPI: Patient is a 77 y.o. female  She is having in home PT once weekly Neurologist in [place] says theres not much that can be done.  She is able to walk weekly She is tired all the time.  Blood pressure is low when she is sitting down it was 88/62 and unable to obtain when she was standing up.   She has decreased her sinemet dose but has a lot of twitching.  She did not like to idea of taking Midodrine due to elevation of blood pressure when laying.  182/80 with her head slightly up.  But when she sleeps like this she wakes up with more stiffness.   Potassium was low last month but corrected and was taking potassium supplement but has completed now.   She has appt with parkinson clinic Dr Rubin Payor [facility] in April.    Review of Systems:  Review of Systems  Constitutional:  Negative for chills, fever and weight loss.  HENT:  Negative for tinnitus.   Respiratory:  Negative for cough, sputum production and shortness of breath.   Cardiovascular:  Negative for chest pain, palpitations and leg swelling.  Gastrointestinal:  Negative for abdominal pain, constipation, diarrhea and heartburn.  Genitourinary:  Negative for dysuria, frequency and urgency.  Musculoskeletal:  Negative for back pain, falls, joint pain and myalgias.  Skin: Negative.   Neurological:  Negative for dizziness and headaches.  Psychiatric/Behavioral:  Negative for depression and memory loss. The patient does not have insomnia.     Past Medical  History:  Diagnosis Date   Anxiety    Basal cell carcinoma    Depression    Hypertension    Neuromuscular disorder (HCC)    Osteoarthritis    Osteopenia    Parkinson disease (HCC)    Pneumonia    Tremors of nervous system    legs   uterine ca 10/08/2021   also endometrial cancer   Past Surgical History:  Procedure Laterality Date   CARPAL TUNNEL RELEASE Bilateral 1996/1997   CYSTOSCOPY N/A 10/08/2021   Procedure: CYSTOSCOPY;  Surgeon: Carver Fila, MD;  Location: [facility];  Service: Gynecology;  Laterality: N/A;   ROBOTIC ASSISTED TOTAL HYSTERECTOMY WITH BILATERAL SALPINGO OOPHERECTOMY N/A 10/08/2021   Procedure: XI ROBOTIC ASSISTED TOTAL HYSTERECTOMY WITH BILATERAL SALPINGO OOPHORECTOMY;SENTINEL LYMPH NODE INJECTION;  Surgeon: Carver Fila, MD;  Location: [facility];  Service: Gynecology;  Laterality: N/A;   SQUAMOUS CELL CARCINOMA EXCISION Left 2017   Left jawline   TUBAL LIGATION  1980's   Social History:   reports that she has never smoked. She has never been exposed to tobacco smoke. She has never used smokeless tobacco. She reports that she does not currently use alcohol. She reports that she does not currently use drugs.  Family History  Problem Relation Age of Onset   Endometrial cancer Mother    Diabetes Mother    Alzheimer's disease Father    Coronary artery disease Father    Diabetes Sister  COPD Sister    Diabetes Sister    Heart disease Brother    Other Brother        MVA    Parkinson's disease Brother    Heart disease Brother    Alcohol abuse Brother    Heart disease Brother    Parkinson's disease Maternal Aunt    Breast cancer Maternal Aunt    Colon cancer Paternal Aunt    Parkinson's disease Paternal Aunt    Lung cancer Paternal Uncle    Healthy Son    Healthy Son    Prostate cancer Neg Hx    Pancreatic cancer Neg Hx    Ovarian cancer Neg Hx     Medications: Patient's Medications  New Prescriptions   No medications on file   Previous Medications   ACETAMINOPHEN (TYLENOL PO)    Take 1 tablet by mouth as needed.   CARBIDOPA-LEVODOPA (SINEMET IR) 25-100 MG TABLET    Take 1 tablet by mouth 5 (five) times daily.   ESCITALOPRAM (LEXAPRO) 20 MG TABLET    Take 20 mg by mouth daily.   POTASSIUM CHLORIDE ER 20 MEQ TBCR    Take by mouth. Pt takes 2 day with meal  Modified Medications   No medications on file  Discontinued Medications   No medications on file    Physical Exam:  There were no vitals filed for this visit. There is no height or weight on file to calculate BMI. Wt Readings from Last 3 Encounters:  06/19/23 145 lb (65.8 kg)  05/29/23 145 lb 3.2 oz (65.9 kg)  05/26/23 145 lb (65.8 kg)    Physical Exam Constitutional:      Appearance: Normal appearance.  Pulmonary:     Effort: Pulmonary effort is normal.  Neurological:     Mental Status: She is alert. Mental status is at baseline.  Psychiatric:        Mood and Affect: Mood normal.     Labs reviewed: Basic Metabolic Panel: Recent Labs    05/26/23 1242 05/27/23 2329 06/19/23 1430  NA 136 138 140  K 3.1* 3.4* 4.4  CL 102 102 105  CO2 26 25 28   GLUCOSE 101* 108* 137  BUN 7* 8 7  CREATININE 0.83 1.00 1.03*  CALCIUM 8.8* 9.7 10.4   Liver Function Tests: Recent Labs    11/14/22 1427 05/26/23 1242  AST 12 9*  ALT 5* <5  ALKPHOS  --  79  BILITOT 0.5 1.1  PROT 6.2 6.4*  ALBUMIN  --  4.0   Recent Labs    05/26/23 1242  LIPASE 31   No results for input(s): "AMMONIA" in the last 8760 hours. CBC: Recent Labs    11/14/22 1427 05/26/23 1242 05/27/23 2329  WBC 5.8 4.9 4.6  NEUTROABS 3,608 3.6 2.9  HGB 12.9 13.6 13.6  HCT 38.0 39.9 38.8  MCV 91.6 88.7 86.2  PLT 249 205 228   Lipid Panel: Recent Labs    11/14/22 1427  CHOL 190  HDL 53  LDLCALC 86  TRIG 386*  CHOLHDL 3.6   TSH: No results for input(s): "TSH" in the last 8760 hours. A1C: No results found for: "HGBA1C"   Assessment/Plan 1. Orthostatic  hypotension Ongoing, continues to work with PT to manage.   2. Parkinson's disease, unspecified whether dyskinesia present, unspecified whether manifestations fluctuate (HCC) -has reduced sinemet due to orthostatics, having worsening movements/tremors but severe dizziness has improved.  Continues to work with PT and has follow up appt with neurology  scheduled in April  3. Hypokalemia Replaced with potassium but worried level will continue to drop.  Will follow up bmp in 2 weeks to ensure potassium level stable.  - BASIC METABOLIC PANEL WITH GFR; Future   Next appt:  Janene Harvey. Biagio Borg  [facility] [phone]    Virtual Visit via video  I connected with patient on 06/30/23 at 10:00 AM EST by mychart and verified that I am speaking with the correct person using two identifiers.  Location: Patient: home Provider: [facility]    I discussed the limitations, risks, security and privacy concerns of performing an evaluation and management service by telephone and the availability of in person appointments. I also discussed with the patient that there may be a patient responsible charge related to this service. The patient expressed understanding and agreed to proceed.   I discussed the assessment and treatment plan with the patient. The patient was provided an opportunity to ask questions and all were answered. The patient agreed with the plan and demonstrated an understanding of the instructions.   The patient was advised to call back or seek an in-person evaluation if the symptoms worsen or if the condition fails to improve as anticipated.  I provided 15 minutes of non-face-to-face time during this encounter.  Janene Harvey. Biagio Borg Avs printed and mailed

## 2023-06-30 NOTE — Progress Notes (Signed)
   This service is provided via telemedicine  No vital signs collected/recorded due to the encounter was a telemedicine visit.   Location of patient (ex: home, work):  Home  Patient consents to a telephone visit: Yes  Location of the provider (ex: office, home):  [employer], [place]   Name of any referring provider:  N/A  Names of all persons participating in the telemedicine service and their role in the encounter:  S.Chrae B/CMA, Abbey Chatters, NP, and Patient   Time spent on call:  5 min with medical assistant

## 2023-07-01 ENCOUNTER — Ambulatory Visit: Payer: Self-pay | Admitting: *Deleted

## 2023-07-01 NOTE — Patient Outreach (Addendum)
Care Coordination   Follow Up Visit Note   07/01/2023 Name: Ashley Neal MRN: [id number] DOB: 1946-03-05  Ashley Neal is a 77 y.o. year old female who sees Eubanks, Janene Harvey, NP for primary care. I spoke with  Ashley Neal by phone today.  What matters to the patients health and wellness today?  Blood pressure has been low- passed out and went to ER a few times. Counseling appointment with new person unavailable until March.    Goals Addressed               This Visit's Progress     Receive Counseling & Supportive Services to Reduce & Manage Symptoms of Anxiety & Depression. (pt-stated)         Activities and task to complete in order to accomplish goals.   Expect phone call from [facility] for virtual appointment with new Counselor Call your insurance provider for more information about your Enhanced Benefits as well as to speak with the Navigator for assistance  Continue with  higher dose of Lexapro prescribed   Continue with  HHPT services Continue to monitor and manage your Blood Pressure Consider [employer] P[employer] for transportation Expect phone call to further discuss transportation options  Start / continue relaxed breathing 3 times daily Keep all upcoming appointment discussed today Continue with compliance of taking medication prescribed by Doctor Self Support options    Call [phone] for behavioral health support by phone 24/7 Call 911 for emergent needs         SDOH assessments and interventions completed:  Yes  SDOH Interventions Today    Flowsheet Row Most Recent Value  SDOH Interventions   Depression Interventions/Treatment  Counseling        Care Coordination Interventions:  Yes, provided  Interventions Today    Flowsheet Row Most Recent Value  Chronic Disease   Chronic disease during today's visit Other  [depression]  General Interventions   General Interventions Discussed/Reviewed General Interventions Discussed, [employer],  Communication with, Level of Care  [Pt expresses continued motivation and desires to get back to the [employer] for socialization, activities, etc.]  Communication with Social Work  Administrator, Civil [employer] place referral for BSW to reach out to pt to further assess and assist with transportation options that may be available to aide in her barrier to getting out]  Level of Care Assisted Living  [Pt mentioned beginning to think more about care needs when her needs are higher and she cannot stay home. Pt voices no interest in currently seeking placement as she wants to stay home as long as possible. CSW acknowledged her wishes and offered support]  Exercise Interventions   Exercise Discussed/Reviewed Exercise Discussed  [Pt shared she and a neighbor have been walking together around the block! Offered encouragement to continue this and increase distance/frequency as comfortable. She is also continuing with her HHPT and is progressing]  Education Interventions   Education Provided Provided Education  Provided Verbal Education On Mental Health/Coping with Illness, [employer], Development worker, community  [Pt hopeful to regain strength and ability to drive and to attend activities at the [employer] as well as in-person counseling]  Mental Health Interventions   Mental Health Discussed/Reviewed Mental Health Discussed, Mental Health Reviewed, Coping Strategies, Depression  [Pt scored "6" on Depression Screening today. Reports sleeping great and feels the new RX Lexapro "is taking the edge off". Will assist pt with referral to [facility] for linking with new counselor to begin sooner than other option]  Pharmacy Interventions   Pharmacy Dicussed/Reviewed Pharmacy Topics Discussed  [Lexapro dose is helping. Sleeping great-]  Safety Interventions   Safety Discussed/Reviewed Safety Discussed  [CSW reminded pt to call 988 for mental health support 24/7]         07/01/2023   10:19 AM 05/08/2023   11:06 AM 05/04/2023   10:03 AM 11/14/2022     1:45 PM  Depression screen PHQ 2/9  Decreased Interest 2 2 1  0  Down, Depressed, Hopeless 1 2 1  0  PHQ - 2 Score 3 4 2  0  Altered sleeping 0 0 0   Tired, decreased energy 2 1 1    Change in appetite 1 0 0   Feeling bad or failure about yourself  0 0 0   Trouble concentrating 0 0 0   Moving slowly or fidgety/restless 0 2 0   Suicidal thoughts 0 0 0   PHQ-9 Score 6 7 3    Difficult doing work/chores Somewhat difficult Somewhat difficult      Follow up plan: Follow up call scheduled for 07/14/23    Encounter Outcome:  Patient Visit Completed

## 2023-07-01 NOTE — Patient Instructions (Signed)
Visit Information  Thank you for taking time to visit with me today. Please don't hesitate to contact me if I can be of assistance to you.   Following are the goals we discussed today:   Goals Addressed               This Visit's Progress     Receive Counseling & Supportive Services to Reduce & Manage Symptoms of Anxiety & Depression. (pt-stated)         Activities and task to complete in order to accomplish goals.   Expect phone call from [facility] for virtual appointment with new Counselor Call your insurance provider for more information about your Enhanced Benefits as well as to speak with the Navigator for assistance  Continue with  higher dose of Lexapro prescribed   Continue with  HHPT services Continue to monitor and manage your Blood Pressure Consider [employer] P[employer] for transportation Expect phone call to further discuss transportation options  Start / continue relaxed breathing 3 times daily Keep all upcoming appointment discussed today Continue with compliance of taking medication prescribed by Doctor Self Support options    Call 988 for behavioral health support by phone 24/7 Call 911 for emergent needs         Our next appointment is by telephone on 07/14/23    Please call the care guide team at [phone] if you need to cancel or reschedule your appointment.   If you are experiencing a Mental Health or Behavioral Health Crisis or need someone to talk to, please call the Suicide and Crisis Lifeline: 988 call 911   The patient verbalized understanding of instructions, educational materials, and care plan provided today and DECLINED offer to receive copy of patient instructions, educational materials, and care plan.   Telephone follow up appointment with care management team member scheduled for:07/14/23  Reece Levy, MSW, LCSW [facility]  [facility], [facility] Licensed Clinical Social Worker Care Coordinator  [phone]

## 2023-07-02 ENCOUNTER — Ambulatory Visit: Payer: Self-pay | Admitting: Licensed Clinical Social Worker

## 2023-07-02 NOTE — Patient Instructions (Signed)
Visit Information  Thank you for taking time to visit with me today. Please don't hesitate to contact me if I can be of assistance to you.   Following are the goals we discussed today:   Goals Addressed             This Visit's Progress    Csre Coordination Activities       Care Coordination Interventions: Patient stated that she needs assistance getting to her appointments in [facility] and normally she is able to get a ride from family and friends. SW offered to assist with the SACT application, but the patient declined and said that she has not heard good things about SCAT and she does not want to sit around and wait for the SCAT bus to pick her up. SW explained about the SCAT services.  Patient stated that he son's does live with her and he will only drive to pick up groceries and other items that the household may need. Patient stated that she has not driven in about 2 months and that is due to her blood pressure being so low and the possible risk of her getting dizzy, patient stated that she wants to get back to driving, because she likes to be independent and she does not want anyone to take that away form her. SW offered to call the insurance ([employer]) to ask about transportation, patient stated that she has called them but did not ask them about that she asked about other services that were offered. Patient stated that she will call herself and ask about the transportation. SW offered to the patient that if she is not understanding of the information the SW will assist with call back to the insurance company with her on the line. Patient stated that she is currently still married and her husband is living in [place], she is receiving benefits from [employer] and stated that she is going to find the number that she normally calls for services for medical assistance and provide it to the SW. SW will follow up with the patient on 07/16/2023 at 10:30 am SW went over other SDOH  and no other needs except transportation.         Our next appointment is by telephone on 07/16/2023 at 10:30 am  Please call the care guide team at [phone] if you need to cancel or reschedule your appointment.   If you are experiencing a Mental Health or Behavioral Health Crisis or need someone to talk to, please call the Suicide and Crisis Lifeline: 988 go to [facility]gent [facility] [address] [place] [phone]) call 911  Patient verbalizes understanding of instructions and care plan provided today and agrees to view in MyChart. Active MyChart status and patient understanding of how to access instructions and care plan via MyChart confirmed with patient.     Jeanie Cooks, PhD [facility], [facility] Social Worker Direct Dial: [phone]  Fax: [phone]

## 2023-07-02 NOTE — Patient Outreach (Signed)
  Care Coordination   Initial Visit Note   07/02/2023 Name: Ashley Neal MRN: [id number] DOB: 1946-07-04  Ashley Neal is a 77 y.o. year old female who sees Eubanks, Janene Harvey, NP for primary care. I spoke with  Ashley Neal by phone today.  What matters to the patients health and wellness today?  Transportation     Goals Addressed             This Visit's Progress    Csre Coordination Activities       Care Coordination Interventions: Patient stated that she needs assistance getting to her appointments in [place] and normally she is able to get a ride from family and friends. SW offered to assist with the SACT application, but the patient declined and said that she has not heard good things about SCAT and she does not want to sit around and wait for the SCAT bus to pick her up. SW explained about the SCAT services.  Patient stated that he son's does live with her and he will only drive to pick up groceries and other items that the household may need. Patient stated that she has not driven in about 2 months and that is due to her blood pressure being so low and the possible risk of her getting dizzy, patient stated that she wants to get back to driving, because she likes to be independent and she does not want anyone to take that away form her. SW offered to call the insurance ([employer]) to ask about transportation, patient stated that she has called them but did not ask them about that she asked about other services that were offered. Patient stated that she will call herself and ask about the transportation. SW offered to the patient that if she is not understanding of the information the SW will assist with call back to the insurance company with her on the line. Patient stated that she is currently still married and her husband is living in [place], she is receiving benefits from [employer] and stated that she is going to find the number that she normally calls  for services for medical assistance and provide it to the SW. SW will follow up with the patient on 07/16/2023 at 10:30 am SW went over other SDOH and no other needs except transportation.         SDOH assessments and interventions completed:  Yes  SDOH Interventions Today    Flowsheet Row Most Recent Value  SDOH Interventions   Food Insecurity Interventions Intervention Not Indicated  Housing Interventions Intervention Not Indicated  Transportation Interventions --  [Patient wants to call [employer] to see about transportation, she does not want anyone taking her independence away form her.]  Utilities Interventions Intervention Not Indicated        Care Coordination Interventions:  Yes, provided  Interventions Today    Flowsheet Row Most Recent Value  General Interventions   General Interventions Discussed/Reviewed General Interventions Discussed, Community Resources  [Patient wants to call [employer] to see about transportation, she does not want anyone taking her independence away form her. Patient denied SCAT]        Follow up plan: Follow up call scheduled for 07/16/2023 at 10:30 am     Encounter Outcome:  Patient Visit Completed   Jeanie Cooks, PhD [facility], [facility] Social Worker Direct Dial: [phone]  Fax: [phone]

## 2023-07-03 DIAGNOSIS — F4323 Adjustment disorder with mixed anxiety and depressed mood: Secondary | ICD-10-CM | POA: Diagnosis not present

## 2023-07-13 ENCOUNTER — Telehealth: Payer: Self-pay | Admitting: *Deleted

## 2023-07-13 ENCOUNTER — Other Ambulatory Visit: Payer: PPO

## 2023-07-13 DIAGNOSIS — E876 Hypokalemia: Secondary | ICD-10-CM

## 2023-07-13 NOTE — Telephone Encounter (Signed)
Ashley Neal with [facility] Called requesting Verbal Orders to Extend PT 1x2weeks.   Verbal Orders given.

## 2023-07-14 ENCOUNTER — Encounter: Payer: PPO | Admitting: *Deleted

## 2023-07-14 LAB — BASIC METABOLIC PANEL WITH GFR
BUN: 14 mg/dL (ref 7–25)
CO2: 29 mmol/L (ref 20–32)
Calcium: 9.6 mg/dL (ref 8.6–10.4)
Chloride: 104 mmol/L (ref 98–110)
Creat: 0.77 mg/dL (ref 0.60–1.00)
Glucose, Bld: 115 mg/dL — ABNORMAL HIGH (ref 65–99)
Potassium: 3.9 mmol/L (ref 3.5–5.3)
Sodium: 140 mmol/L (ref 135–146)
eGFR: 79 mL/min/{1.73_m2} (ref >=60)

## 2023-07-16 ENCOUNTER — Encounter: Payer: PPO | Admitting: Licensed Clinical Social Worker

## 2023-07-23 ENCOUNTER — Ambulatory Visit: Payer: Self-pay | Admitting: Licensed Clinical Social Worker

## 2023-07-23 NOTE — Patient Outreach (Signed)
  Care Coordination   Follow Up Visit Note   07/23/2023 Name: Ashley Neal MRN: [id number] DOB: [birth year]  Ashley Neal is a 77 y.o. year old female who sees Eubanks, Janene Harvey, NP for primary care. I spoke with  Ashley Neal by phone today.  What matters to the patients health and wellness today?  Patient has been driving and not getting dizzy and also  has sources for transportation.    Goals Addressed             This Visit's Progress    COMPLETED: Csre Coordination Activities       Care Coordination Interventions: Patient stated that she needs assistance getting to her appointments in [facility] and normally she is able to get a ride from family and friends. SW offered to assist with the SACT application, but the patient declined and said that she has not heard good things about SCAT and she does not want to sit around and wait for the SCAT bus to pick her up. SW explained about the SCAT services.  Patient stated that he son's does live with her and he will only drive to pick up groceries and other items that the household may need. Patient stated that she has not driven in about 2 months and that is due to her blood pressure being so low and the possible risk of her getting dizzy, patient stated that she wants to get back to driving, because she likes to be independent and she does not want anyone to take that away form her. SW offered to call the insurance ([employer]) to ask about transportation, patient stated that she has called them but did not ask them about that she asked about other services that were offered. Patient stated that she will call herself and ask about the transportation. SW offered to the patient that if she is not understanding of the information the SW will assist with call back to the insurance company with her on the line. Patient stated that she is currently still married and her husband is living in [place], she is receiving benefits  from [employer] and stated that she is going to find the number that she normally calls for services for medical assistance and provide it to the SW. SW will follow up with the patient on 07/16/2023 at 10:30 am SW went over other SDOH and no other needs except transportation.         SDOH assessments and interventions completed:  Yes  SDOH Interventions Today    Flowsheet Row Most Recent Value  SDOH Interventions   Food Insecurity Interventions Intervention Not Indicated  Housing Interventions Intervention Not Indicated  Transportation Interventions Intervention Not Indicated  Utilities Interventions Intervention Not Indicated        Care Coordination Interventions:  Yes, provided  Interventions Today    Flowsheet Row Most Recent Value  General Interventions   General Interventions Discussed/Reviewed General Interventions Reviewed, [employer] resources nfor transportation and has been driving]        Follow up plan: No further intervention required.   Encounter Outcome:  Patient Visit Completed   Jeanie Cooks, PhD [facility], [facility] Social Worker Direct Dial: [phone]  Fax: [phone]

## 2023-07-23 NOTE — Patient Instructions (Signed)
Visit Information  Thank you for taking time to visit with me today. Please don't hesitate to contact me if I can be of assistance to you.   Following are the goals we discussed today:   Goals Addressed             This Visit's Progress    COMPLETED: Csre Coordination Activities       Care Coordination Interventions: Patient stated that she needs assistance getting to her appointments in [facility] and normally she is able to get a ride from family and friends. SW offered to assist with the SACT application, but the patient declined and said that she has not heard good things about SCAT and she does not want to sit around and wait for the SCAT bus to pick her up. SW explained about the SCAT services.  Patient stated that he son's does live with her and he will only drive to pick up groceries and other items that the household may need. Patient stated that she has not driven in about 2 months and that is due to her blood pressure being so low and the possible risk of her getting dizzy, patient stated that she wants to get back to driving, because she likes to be independent and she does not want anyone to take that away form her. SW offered to call the insurance ([employer]) to ask about transportation, patient stated that she has called them but did not ask them about that she asked about other services that were offered. Patient stated that she will call herself and ask about the transportation. SW offered to the patient that if she is not understanding of the information the SW will assist with call back to the insurance company with her on the line. Patient stated that she is currently still married and her husband is living in [place], she is receiving benefits from [employer] and stated that she is going to find the number that she normally calls for services for medical assistance and provide it to the SW. SW will follow up with the patient on 07/16/2023 at 10:30 am SW went over  other SDOH and no other needs except transportation.         No further follow up needed at this time  Please call the care guide team at [phone] if you need to cancel or reschedule your appointment.   If you are experiencing a Mental Health or Behavioral Health Crisis or need someone to talk to, please call the Suicide and Crisis Lifeline: 988 go to [facility] Urgent [facility] [address] [place] [phone]) call 911  Patient verbalizes understanding of instructions and care plan provided today and agrees to view in MyChart. Active MyChart status and patient understanding of how to access instructions and care plan via MyChart confirmed with patient.     Jeanie Cooks, PhD [facility], [facility] Social Worker Direct Dial: [phone]  Fax: [phone]

## 2023-07-28 ENCOUNTER — Ambulatory Visit: Payer: Self-pay | Admitting: *Deleted

## 2023-07-29 ENCOUNTER — Telehealth: Payer: Self-pay | Admitting: *Deleted

## 2023-07-29 NOTE — Patient Outreach (Signed)
  Care Coordination   Follow Up Visit Note   07/29/2023 Name: Ashley Neal MRN: [id number] DOB: 1945-12-21  Ashley Neal is a 77 y.o. year old female who sees Eubanks, Janene Harvey, NP for primary care. I spoke with  Ashley Neal by phone today.  What matters to the patients health and wellness today?  Dealing with some "arthritis" but motivated to keep moving.     Goals Addressed               This Visit's Progress     COMPLETED: Receive Counseling & Supportive Services to Reduce & Manage Symptoms of Anxiety & Depression. (pt-stated)         Activities and task to complete in order to accomplish goals.   Attend first visit at [facility] 08/10/23 Call your insurance provider for more information about your Enhanced Benefits as well as to speak with the Navigator for assistance  Continue with  higher dose of Lexapro as prescribed   Continue with  exercise and activity Continue to monitor and manage your Blood Pressure Consider [employer] PALS  Start / continue relaxed breathing 3 times daily Keep all upcoming appointment discussed today Continue with compliance of taking medication prescribed by Doctor Self Support options    Call 988 for behavioral health support by phone 24/7 Call 911 for emergent needs         SDOH assessments and interventions completed:  Yes     Care Coordination Interventions:  Yes, provided  Interventions Today    Flowsheet Row Most Recent Value  Chronic Disease   Chronic disease during today's visit Other  General Interventions   General Interventions Discussed/Reviewed Level of Care  [Pt not planning to move anytime soon- hopeful she can remain at home for a few more years but is planning to connect wtih some area communities/facilities to see what is out there and know what she may prefer- and get on wait list for future need.]  Level of Care Assisted Living  [PLanning to go look at a few retirement communities- considering  options- [facility], [facility], [facility], [facility] etc to consider]  Exercise Interventions   Exercise Discussed/Reviewed Exercise Discussed, Physical Activity  [HHPT ended- encouraged to keep exercising/walking - motivated to do all she can]  Mental Health Interventions   Mental Health Discussed/Reviewed Mental Health Discussed  [Pt plans to attend her first counseling session wtih "Alycia Rossetti" and will decide if she prefers female and request. Assured pt this is a common request. She declines further outreach from [place] at this time and will reach out to PCP or [place] for further needs PRN]       Follow up plan: No further intervention required.   Encounter Outcome:  Patient Visit Completed

## 2023-07-29 NOTE — Patient Instructions (Signed)
Visit Information  Thank you for taking time to visit with me today. Please don't hesitate to contact me if I can be of assistance to you.   Following are the goals we discussed today:   Goals Addressed               This Visit's Progress     COMPLETED: Receive Counseling & Supportive Services to Reduce & Manage Symptoms of Anxiety & Depression. (pt-stated)         Activities and task to complete in order to accomplish goals.   Attend first visit at [facility] 08/10/23 Call your insurance provider for more information about your Enhanced Benefits as well as to speak with the Navigator for assistance  Continue with  higher dose of Lexapro as prescribed   Continue with  exercise and activity Continue to monitor and manage your Blood Pressure Consider PAPA PALS  Start / continue relaxed breathing 3 times daily Keep all upcoming appointment discussed today Continue with compliance of taking medication prescribed by Doctor Self Support options    Call 988 for behavioral health support by phone 24/7 Call 911 for emergent needs           Please call the care guide team at [phone] if you need to reconnect and schedule further appointment.   If you are experiencing a Mental Health or Behavioral Health Crisis or need someone to talk to, please call the Suicide and Crisis Lifeline: 988 call 911   The patient verbalized understanding of instructions, educational materials, and care plan provided today and DECLINED offer to receive copy of patient instructions, educational materials, and care plan.   No further follow up required:    Reece Levy, MSW, LCSW [facility], [facility] Licensed Clinical Social Worker Care Coordinator  [phone]

## 2023-08-10 DIAGNOSIS — F411 Generalized anxiety disorder: Secondary | ICD-10-CM | POA: Diagnosis not present

## 2023-08-10 DIAGNOSIS — F330 Major depressive disorder, recurrent, mild: Secondary | ICD-10-CM | POA: Diagnosis not present

## 2023-08-19 IMAGING — CR DG SACRUM/COCCYX 2+V
3 series · 3 of 3 positions shown · non-contrast
Comparison: None

CLINICAL DATA: Low back pain and buttock pain, initial encounter

EXAM:
SACRUM AND COCCYX - 2+ VIEW

[w sacrum a.p. *]
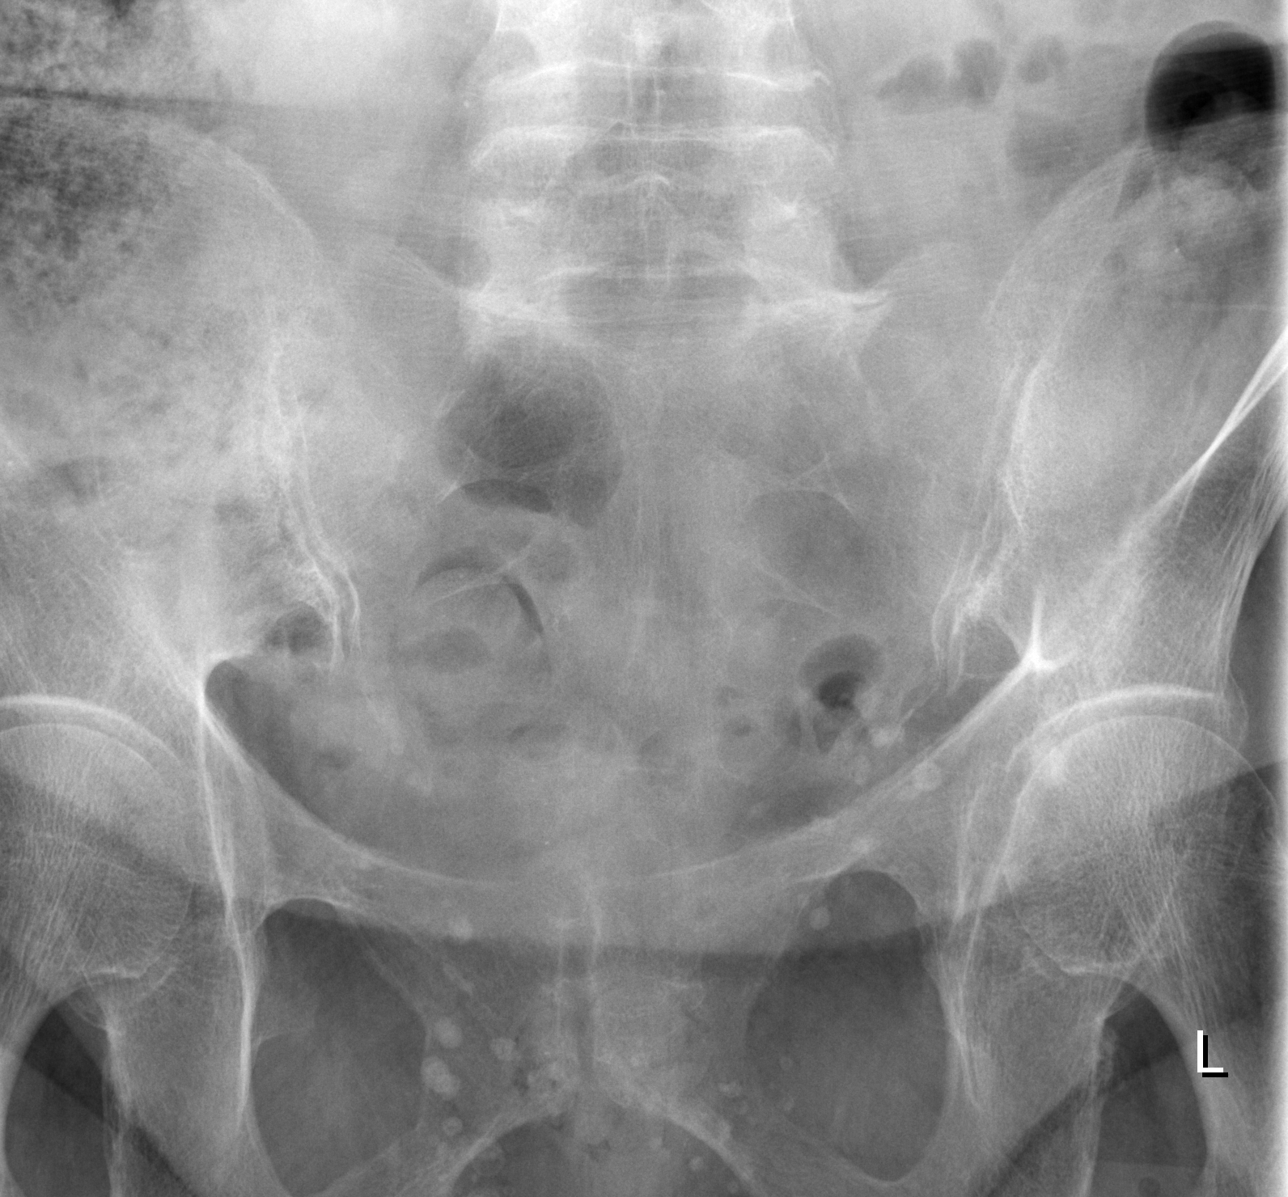

[w coccyx a.p.]
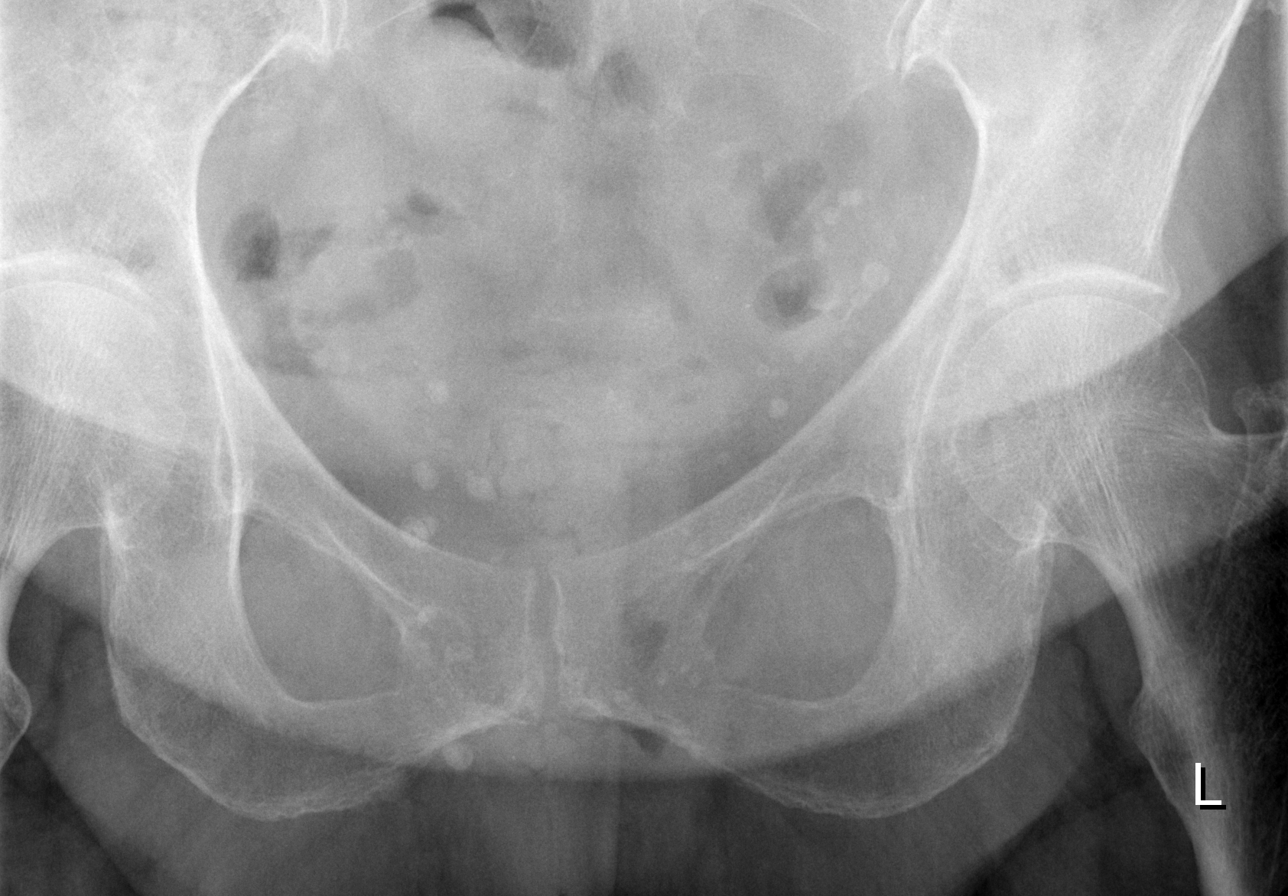

[w sacrum lat]
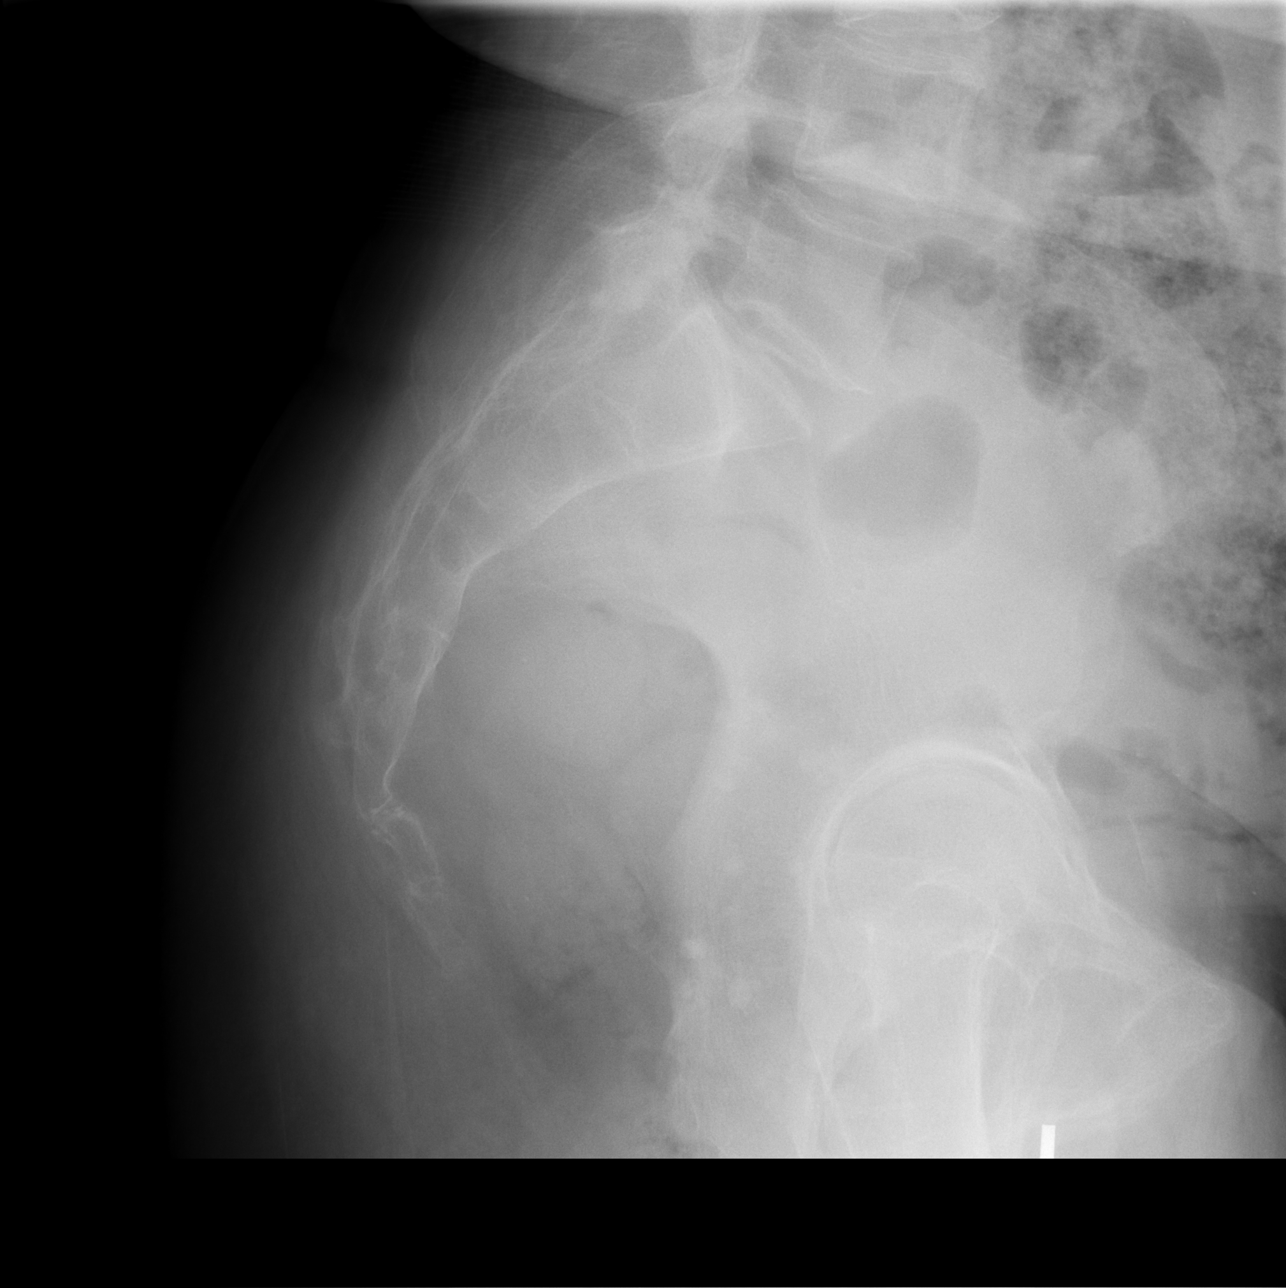

[3 of 3 positions shown; findings below may reference images not displayed]

FINDINGS: Pelvic ring is intact. No acute fracture or dislocation is noted. No
soft tissue abnormality is seen.
IMPRESSION: No acute bony abnormality noted.

## 2023-08-24 ENCOUNTER — Encounter: Payer: Self-pay | Admitting: Nurse Practitioner

## 2023-08-24 ENCOUNTER — Ambulatory Visit (INDEPENDENT_AMBULATORY_CARE_PROVIDER_SITE_OTHER): Payer: PPO | Admitting: Nurse Practitioner

## 2023-08-24 ENCOUNTER — Ambulatory Visit: Payer: PPO | Admitting: Nurse Practitioner

## 2023-08-24 VITALS — BP 122/80 | HR 87 | Temp 97.9°F | Ht 61.0 in | Wt 155.0 lb

## 2023-08-24 DIAGNOSIS — G20A1 Parkinson's disease without dyskinesia, without mention of fluctuations: Secondary | ICD-10-CM

## 2023-08-24 DIAGNOSIS — F3341 Major depressive disorder, recurrent, in partial remission: Secondary | ICD-10-CM

## 2023-08-24 DIAGNOSIS — I951 Orthostatic hypotension: Secondary | ICD-10-CM | POA: Diagnosis not present

## 2023-08-24 DIAGNOSIS — M5416 Radiculopathy, lumbar region: Secondary | ICD-10-CM | POA: Diagnosis not present

## 2023-08-24 NOTE — Progress Notes (Signed)
 Careteam: Patient Care Team: Caro Harlene POUR, NP as PCP - General (Geriatric Medicine) Dann Candyce RAMAN, MD as PCP - Cardiology (Cardiology)  PLACE OF SERVICE:  [facility]  Advanced Directive information Does Patient Have a Medical Advance Directive?: Yes, Type of Advance Directive: Healthcare Power of [place];Living will;Out of facility DNR (pink MOST or yellow form), Pre-existing out of facility DNR order (yellow form or pink MOST form): Pink MOST form placed in chart (order not valid for inpatient use), Does patient want to make changes to medical advance directive?: No - Patient declined  Allergies  Allergen Reactions   Codeine Other (See Comments)    Nausea     Chief Complaint  Patient presents with   Medical Management of Chronic Issues    6 month follow-up. Discuss need for covid booster, hep c screening, and pneumonia vaccine. NCIR verified. Discuss potassium dose and instructions. Moderate fall risk. Patient c/o ongoing back pain, limiting her mobility.      HPI: Patient is a 78 y.o. female for follow up   She reports she has gained weight because her back hurts so bad.  She has not seen anyone recently for this because she was already on too much medication and parkinson was so bad she could not get up.  She took gabapentin but it put her in a mind fog.   Her mobility has stabilize, no longer having such severe hypotension.  Has not passes out since she cut back to 1 tablet 5 times daily.  Her parkinson symptoms are stable, no bad tremors.  Now able to drive herself.   She was measured for a wheelchair but has not gotten it at this time.   She had low potassium and took potassium supplement but did not continue.  Eating more bananas.   Planning to follow up with her eye doctor because her vision has been getting worse.   She saw Dr Bonner for her back pain and he said he was out of opinion. Saw neurosurgery in the past  She continues to do exercises by  PT.   She is on lexapro which has been working well.    Review of Systems:  Review of Systems  Constitutional:  Negative for chills, fever and weight loss.  HENT:  Negative for tinnitus.   Respiratory:  Negative for cough, sputum production and shortness of breath.   Cardiovascular:  Negative for chest pain, palpitations and leg swelling.  Gastrointestinal:  Negative for abdominal pain, constipation, diarrhea and heartburn.  Genitourinary:  Negative for dysuria, frequency and urgency.  Musculoskeletal:  Positive for back pain. Negative for falls, joint pain and myalgias.  Skin: Negative.   Neurological:  Positive for tremors and weakness. Negative for dizziness and headaches.  Psychiatric/Behavioral:  Negative for depression and memory loss. The patient does not have insomnia.     Past Medical History:  Diagnosis Date   Anxiety    Basal cell carcinoma    Depression    Hypertension    Neuromuscular disorder (HCC)    Osteoarthritis    Osteopenia    Parkinson disease (HCC)    Pneumonia    Tremors of nervous system    legs   uterine ca 10/08/2021   also endometrial cancer   Past Surgical History:  Procedure Laterality Date   CARPAL TUNNEL RELEASE Bilateral 1996/1997   CYSTOSCOPY N/A 10/08/2021   Procedure: CYSTOSCOPY;  Surgeon: Viktoria Comer SAUNDERS, MD;  Location: [facility];  Service: Gynecology;  Laterality: N/A;  ROBOTIC ASSISTED TOTAL HYSTERECTOMY WITH BILATERAL SALPINGO OOPHERECTOMY N/A 10/08/2021   Procedure: XI ROBOTIC ASSISTED TOTAL HYSTERECTOMY WITH BILATERAL SALPINGO OOPHORECTOMY;SENTINEL LYMPH NODE INJECTION;  Surgeon: Viktoria Comer SAUNDERS, MD;  Location: [facility];  Service: Gynecology;  Laterality: N/A;   SQUAMOUS CELL CARCINOMA EXCISION Left 2017   Left jawline   TUBAL LIGATION  1980's   Social History:   reports that she has never smoked. She has never been exposed to tobacco smoke. She has never used smokeless tobacco. She reports that she does not currently use  alcohol. She reports that she does not currently use drugs.  Family History  Problem Relation Age of Onset   Endometrial cancer Mother    Diabetes Mother    Alzheimer's disease Father    Coronary artery disease Father    Diabetes Sister    COPD Sister    Diabetes Sister    Heart disease Brother    Other Brother        MVA    Parkinson's disease Brother    Heart disease Brother    Alcohol abuse Brother    Heart disease Brother    Parkinson's disease Maternal Aunt    Breast cancer Maternal Aunt    Colon cancer Paternal Aunt    Parkinson's disease Paternal Aunt    Lung cancer Paternal Uncle    Healthy Son    Healthy Son    Prostate cancer Neg Hx    Pancreatic cancer Neg Hx    Ovarian cancer Neg Hx     Medications: Patient's Medications  New Prescriptions   No medications on file  Previous Medications   ACETAMINOPHEN  (TYLENOL  PO)    Take 1 tablet by mouth as needed.   CARBIDOPA -LEVODOPA  (SINEMET  IR) 25-100 MG TABLET    Take 1 tablet by mouth 5 (five) times daily.   ESCITALOPRAM (LEXAPRO) 20 MG TABLET    Take 20 mg by mouth daily.   POTASSIUM CHLORIDE  ER 20 MEQ TBCR    Take by mouth. Pt takes 2 day with meal  Modified Medications   No medications on file  Discontinued Medications   No medications on file    Physical Exam:  Vitals:   08/24/23 1438 08/24/23 1448  BP: 118/84 122/80  Pulse: 87   Temp: 97.9 F (36.6 C)   TempSrc: Temporal   SpO2: 97%   Weight: 155 lb (70.3 kg)   Height: 5' 1 (1.549 m)    Body mass index is 29.29 kg/m. Wt Readings from Last 3 Encounters:  08/24/23 155 lb (70.3 kg)  06/19/23 145 lb (65.8 kg)  05/29/23 145 lb 3.2 oz (65.9 kg)    Physical Exam Constitutional:      General: She is not in acute distress.    Appearance: She is well-developed. She is not diaphoretic.  HENT:     Head: Normocephalic and atraumatic.     Mouth/Throat:     Pharynx: No oropharyngeal exudate.  Eyes:     Conjunctiva/sclera: Conjunctivae normal.      Pupils: Pupils are equal, round, and reactive to light.  Cardiovascular:     Rate and Rhythm: Normal rate and regular rhythm.     Heart sounds: Normal heart sounds.  Pulmonary:     Effort: Pulmonary effort is normal.     Breath sounds: Normal breath sounds.  Abdominal:     General: Bowel sounds are normal.     Palpations: Abdomen is soft.  Musculoskeletal:     Cervical back: Normal range of  motion and neck supple.     Right lower leg: No edema.     Left lower leg: No edema.  Skin:    General: Skin is warm and dry.  Neurological:     Mental Status: She is alert.  Psychiatric:        Mood and Affect: Mood normal.     Labs reviewed: Basic Metabolic Panel: Recent Labs    05/27/23 2329 06/19/23 1430 07/13/23 1040  NA 138 140 140  K 3.4* 4.4 3.9  CL 102 105 104  CO2 25 28 29   GLUCOSE 108* 137 115*  BUN 8 7 14   CREATININE 1.00 1.03* 0.77  CALCIUM 9.7 10.4 9.6   Liver Function Tests: Recent Labs    11/14/22 1427 05/26/23 1242  AST 12 9*  ALT 5* <5  ALKPHOS  --  79  BILITOT 0.5 1.1  PROT 6.2 6.4*  ALBUMIN  --  4.0   Recent Labs    05/26/23 1242  LIPASE 31   No results for input(s): AMMONIA in the last 8760 hours. CBC: Recent Labs    11/14/22 1427 05/26/23 1242 05/27/23 2329  WBC 5.8 4.9 4.6  NEUTROABS 3,608 3.6 2.9  HGB 12.9 13.6 13.6  HCT 38.0 39.9 38.8  MCV 91.6 88.7 86.2  PLT 249 205 228   Lipid Panel: Recent Labs    11/14/22 1427  CHOL 190  HDL 53  LDLCALC 86  TRIG 386*  CHOLHDL 3.6   TSH: No results for input(s): TSH in the last 8760 hours. A1C: No results found for: HGBA1C   Assessment/Plan 1. Parkinson's disease, unspecified whether dyskinesia present, unspecified whether manifestations fluctuate (HCC) (Primary) -she has decreased sinemet  to 1 tablet 5 times daily and reports side effects have significantly decreased.  Continues to follow up with neurology  2. Lumbar radiculopathy Worsening of symptoms, medication have  caused side effects, had injections in the past that were not effective.  She had seen neurosurgery in the past but did not follow up, reports she will reach out for follow up appt  3. Recurrent major depressive disorder, in partial remission (HCC) Stable on lexapro   4. Orthostatic hypotension -improved with reduction of sinemet  IR to 1 tablet daily   Return in about 4 months (around 12/22/2023) for routine follow up .  Shannah Conteh K. Caro BODILY [facility] [phone]

## 2023-09-01 DIAGNOSIS — M5440 Lumbago with sciatica, unspecified side: Secondary | ICD-10-CM | POA: Diagnosis not present

## 2023-09-01 DIAGNOSIS — G20A1 Parkinson's disease without dyskinesia, without mention of fluctuations: Secondary | ICD-10-CM | POA: Diagnosis not present

## 2023-09-07 DIAGNOSIS — L820 Inflamed seborrheic keratosis: Secondary | ICD-10-CM | POA: Diagnosis not present

## 2023-09-07 DIAGNOSIS — L218 Other seborrheic dermatitis: Secondary | ICD-10-CM | POA: Diagnosis not present

## 2023-09-07 DIAGNOSIS — L853 Xerosis cutis: Secondary | ICD-10-CM | POA: Diagnosis not present

## 2023-09-07 DIAGNOSIS — L821 Other seborrheic keratosis: Secondary | ICD-10-CM | POA: Diagnosis not present

## 2023-09-07 DIAGNOSIS — L570 Actinic keratosis: Secondary | ICD-10-CM | POA: Diagnosis not present

## 2023-09-07 DIAGNOSIS — Z85828 Personal history of other malignant neoplasm of skin: Secondary | ICD-10-CM | POA: Diagnosis not present

## 2023-09-09 ENCOUNTER — Other Ambulatory Visit: Payer: Self-pay | Admitting: Nurse Practitioner

## 2023-09-09 DIAGNOSIS — Z1231 Encounter for screening mammogram for malignant neoplasm of breast: Secondary | ICD-10-CM

## 2023-09-10 DIAGNOSIS — F330 Major depressive disorder, recurrent, mild: Secondary | ICD-10-CM | POA: Diagnosis not present

## 2023-09-10 DIAGNOSIS — F411 Generalized anxiety disorder: Secondary | ICD-10-CM | POA: Diagnosis not present

## 2023-09-15 DIAGNOSIS — G20A1 Parkinson's disease without dyskinesia, without mention of fluctuations: Secondary | ICD-10-CM | POA: Diagnosis not present

## 2023-09-18 IMAGING — MR MR PELVIS W/O CM
5 series · 33 of 48 positions shown · non-contrast
Comparison: None.

CLINICAL DATA: Low back pain, bilateral buttock pain

EXAM:
MRI PELVIS WITHOUT CONTRAST
TECHNIQUE: Multiplanar multisequence MR imaging of the pelvis was performed. No
intravenous contrast was administered.

[Series 3: STIR · coronal · 4.0mm · 0.70mm/px · 7 of 31 slices shown]
[im 1/31]
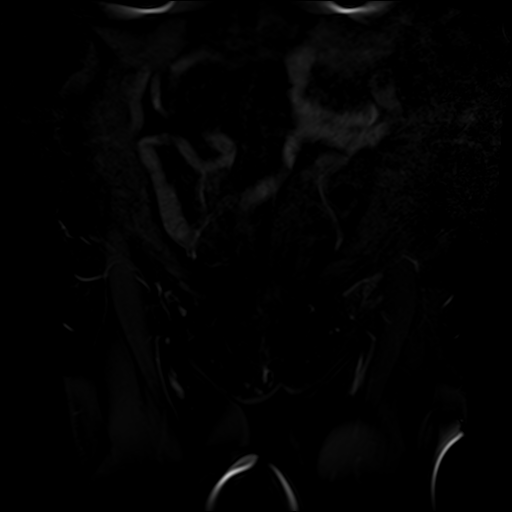
[im 6/31]
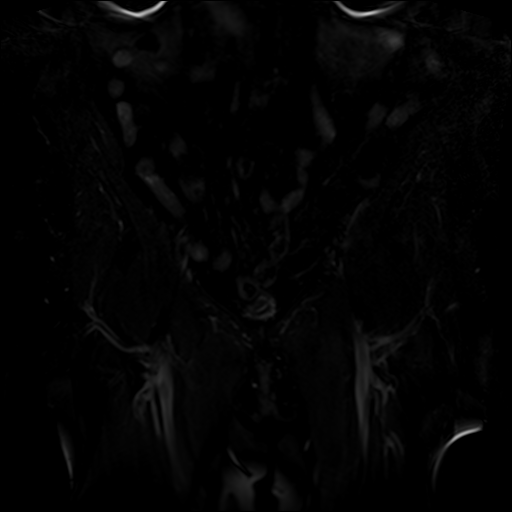
[im 11/31]
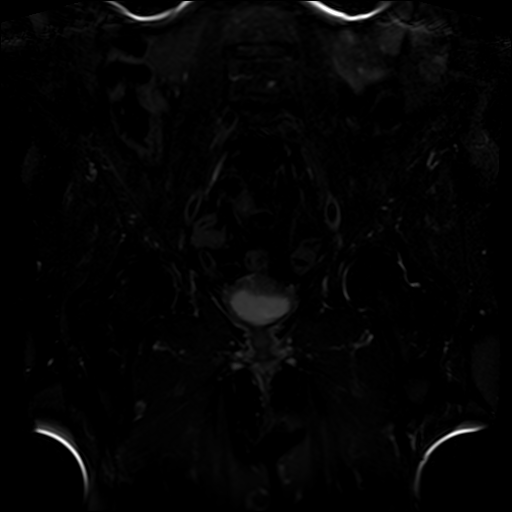
[im 16/31]
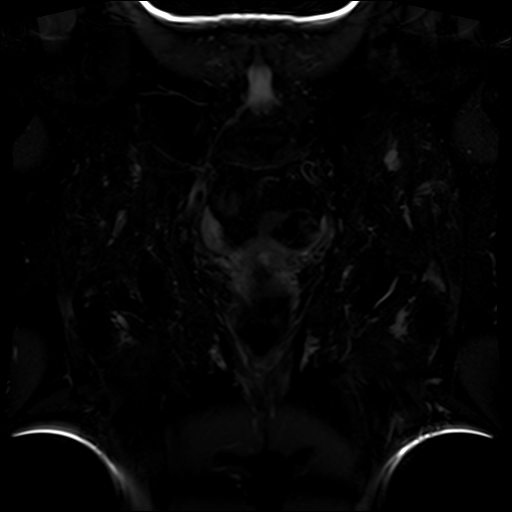
[im 21/31]
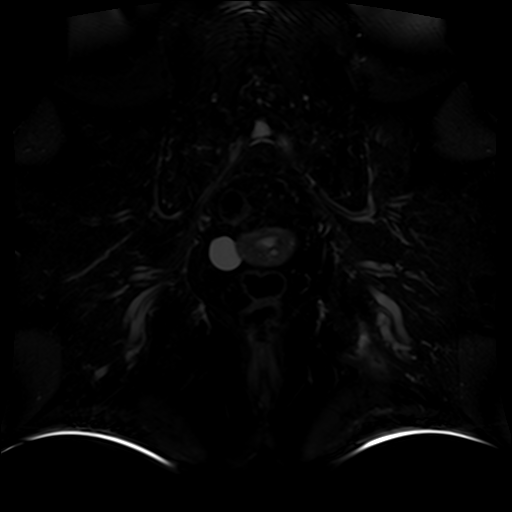
[im 26/31]
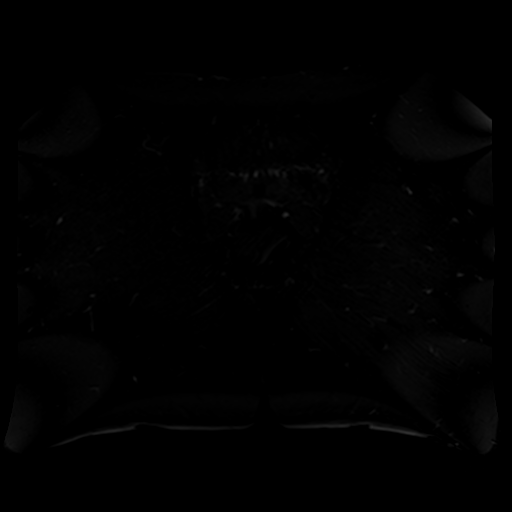
[im 31/31]
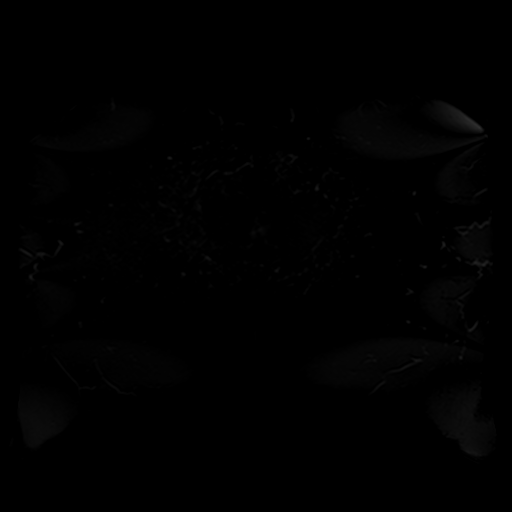

[Series 4: T1 · coronal · 4.0mm · 1.12mm/px · 8 of 33 slices shown (1 of 2)]
[im 1/33]
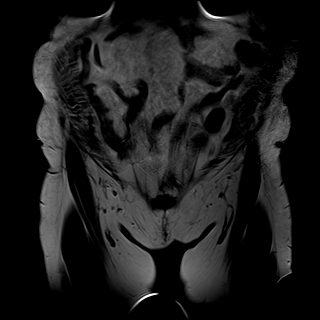
[im 5/33]
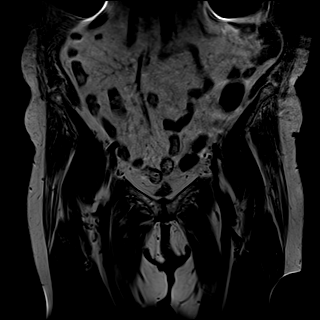
[im 10/33]
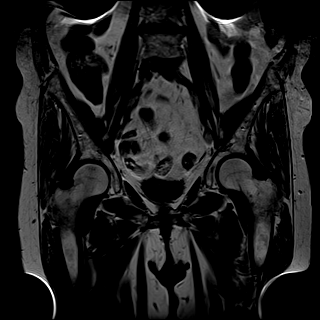
[im 14/33]
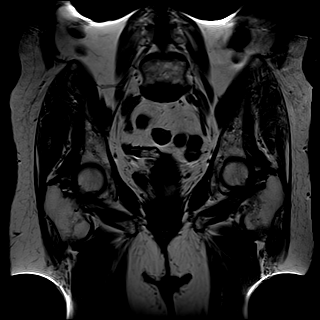
[im 19/33]
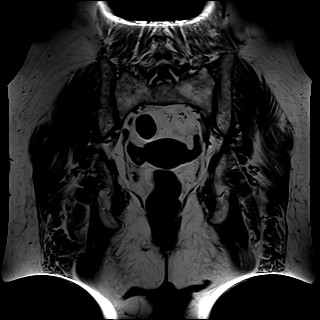
[im 23/33]
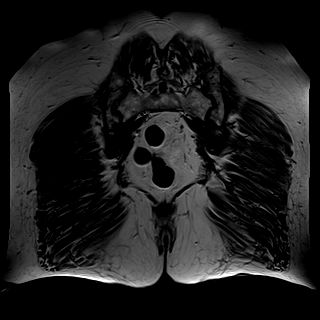
[im 28/33]
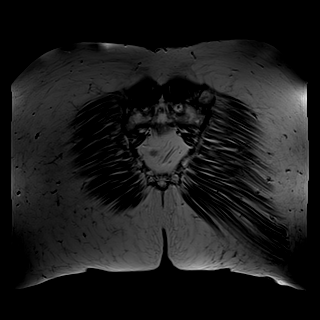
[im 33/33]
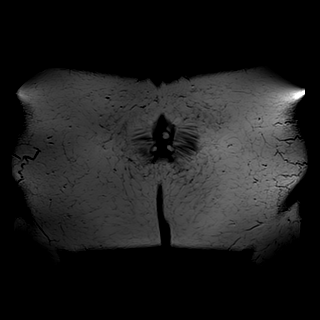

[Series 5: T1 · axial · 4.0mm · 1.00mm/px · z∈[-94,+133]mm · 8 of 43 slices shown (2 of 2)]
[im 1/43]
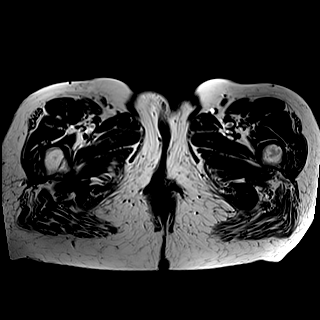
[im 5/43]
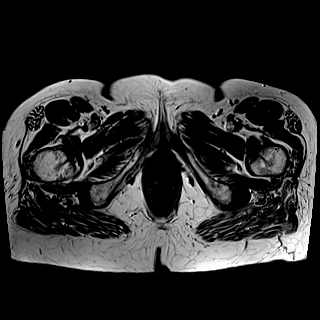
[im 15/43]
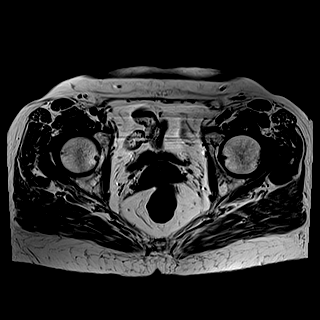
[im 19/43]
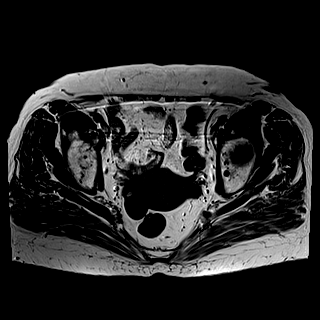
[im 24/43]
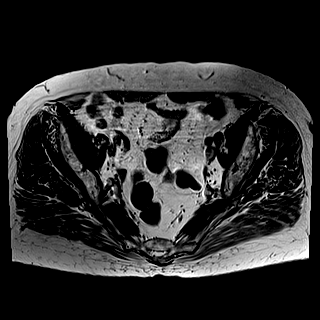
[im 29/43]
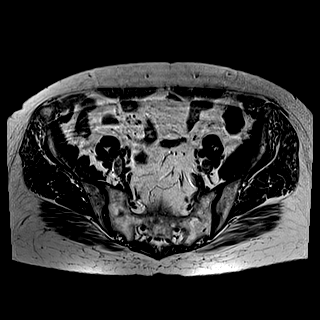
[im 38/43]
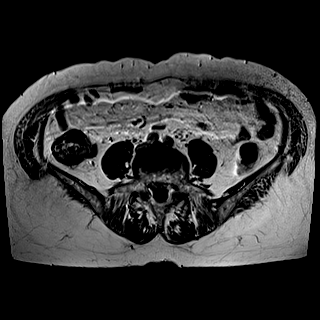
[im 43/43]
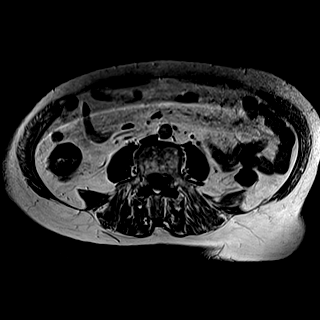

[Series 6: T2 fat-sat · axial · 4.0mm · 1.00mm/px · z∈[-94,+133]mm · 8 of 43 slices shown (1 of 2)]
[im 1/43]
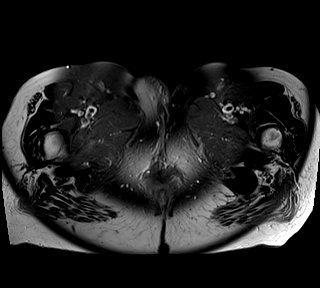
[im 5/43]
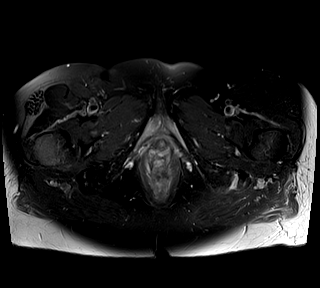
[im 15/43]
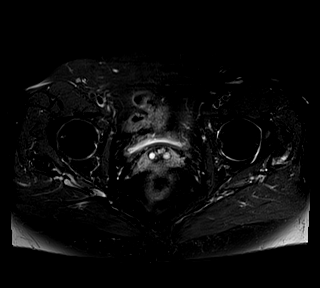
[im 19/43]
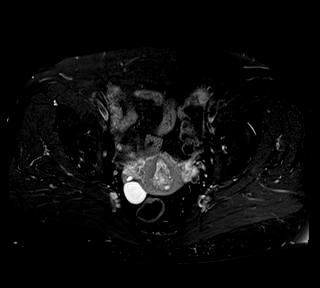
[im 24/43]
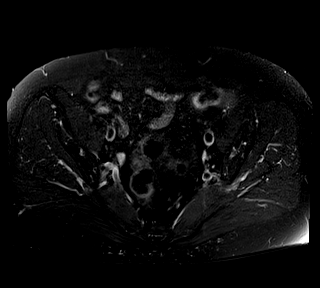
[im 29/43]
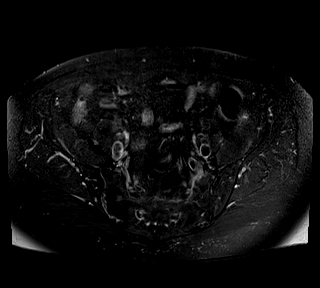
[im 38/43]
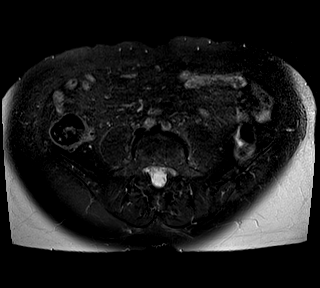
[im 43/43]
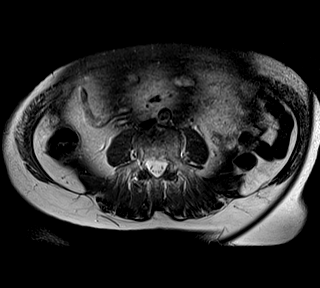

[Series 7: T2 fat-sat · sagittal · 4.0mm · 0.51mm/px · 2 of 53 slices shown (2 of 2)]
[im 1/53]
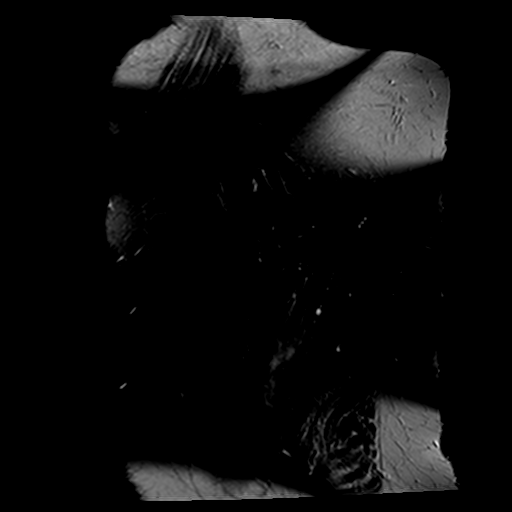
[im 9/53]
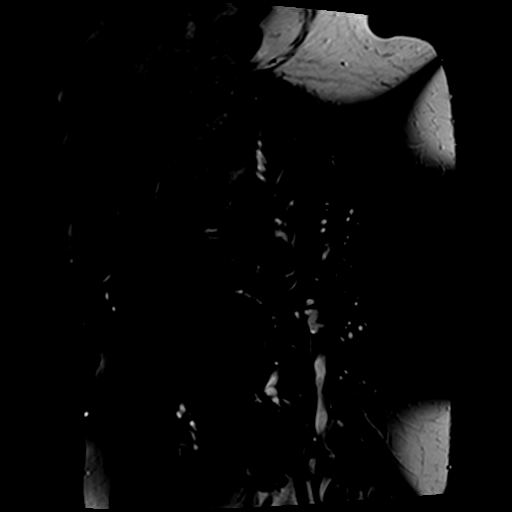

[33 of 48 positions shown; findings below may reference images not displayed]

FINDINGS: Bones:

No hip fracture, dislocation or avascular necrosis.

No periosteal reaction or bone destruction. No aggressive osseous
lesion.

Mild osteoarthritis of bilateral SI joints. No SI joint widening or
erosive changes.

Mild broad-based disc bulge at L4-5 with mild bilateral facet
arthropathy and foraminal narrowing. Mild broad-based disc bulge at
L5-S1 with mild left foraminal narrowing.

Articular cartilage and labrum

Articular cartilage: Partial-thickness cartilage loss of bilateral
hips.

Labrum: Grossly intact, but evaluation is limited by lack of
intraarticular fluid.

Joint or bursal effusion

Joint effusion:  No hip joint effusion.  No SI joint effusion.

Bursae: No bursa formation.

Muscles and tendons

Flexors: Normal.

Extensors: Normal.

Abductors: Normal.

Adductors: Normal.

Gluteals: Partial-thickness tear of the left gluteus medius tendon
insertion.

Hamstrings: Large high-grade partial-thickness tear of the left
hamstring origin.

Other findings

No pelvic free fluid. No fluid collection or hematoma. No inguinal
lymphadenopathy. No inguinal hernia. 2.5 cm right ovarian cyst.
Relative endometrial thickening measuring up to 10 mm suboptimally
evaluated on the current exam.
IMPRESSION: 1. Mild osteoarthritis of bilateral hips.
2. Large high-grade partial-thickness tear of the left hamstring
origin.
3. Partial-thickness tear of the left gluteus medius tendon
insertion.
4. Mild osteoarthritis of bilateral SI joints.
5. Relative endometrial thickening measuring up to 10 mm
suboptimally evaluated on the current exam. Recommend further
evaluation with a pelvic ultrasound.

## 2023-09-18 IMAGING — MR MR LUMBAR SPINE W/O CM
4 of 5 series · 26 of 48 positions shown · non-contrast
Comparison: Lumbar spine radiographs/[DATE]

CLINICAL DATA: Low back pain. Bilateral buttocks pain extending
into left lower extremity for 6 months.

EXAM:
MRI LUMBAR SPINE WITHOUT CONTRAST
TECHNIQUE: Multiplanar, multisequence MR imaging of the lumbar spine was
performed. No intravenous contrast was administered.

[Series 3: T2 · sagittal · 4.0mm · 0.53mm/px · 6 of 15 slices shown (1 of 2)]
[im 1/15]
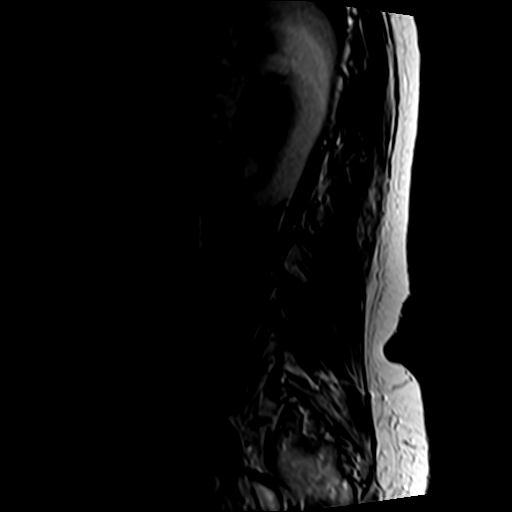
[im 3/15]
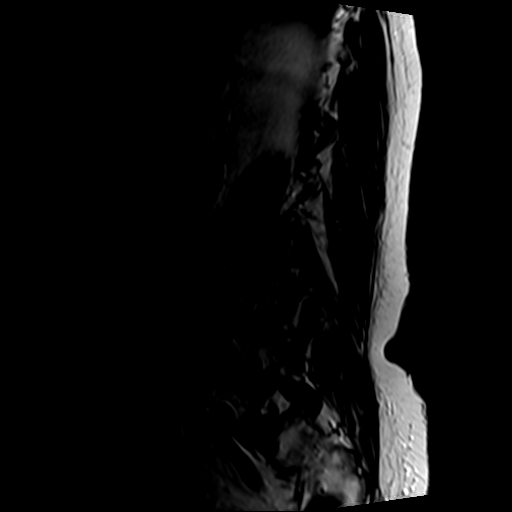
[im 6/15]
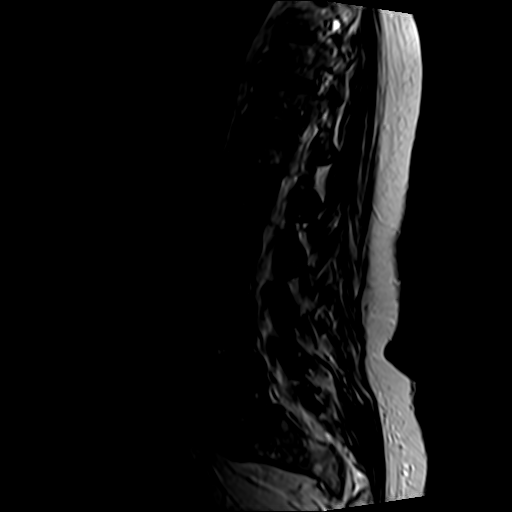
[im 9/15]
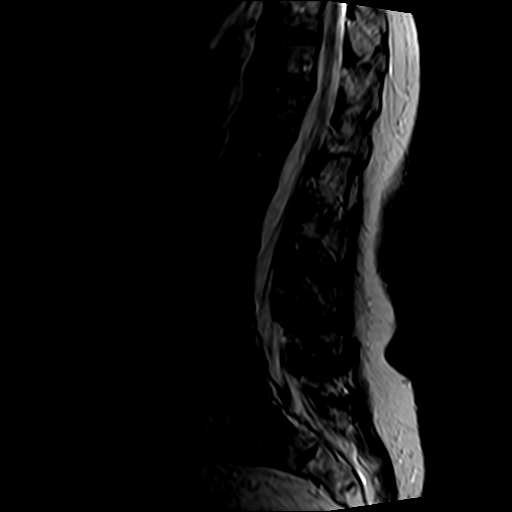
[im 12/15]
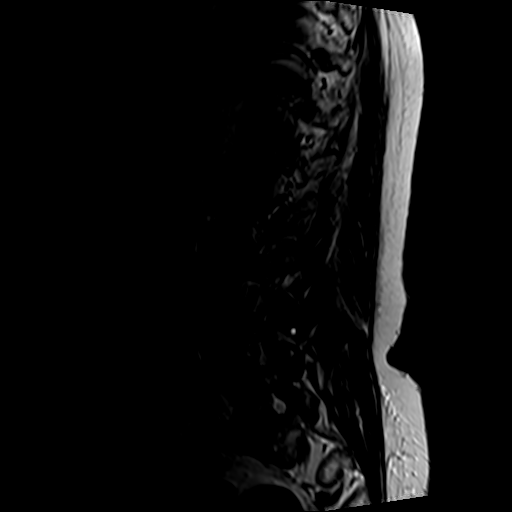
[im 15/15]
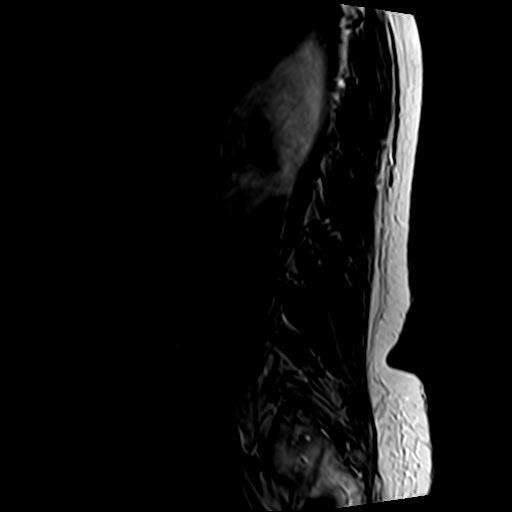

[Series 5: T1 · sagittal · 4.0mm · 0.53mm/px · 6 of 15 slices shown (1 of 2)]
[im 1/15]
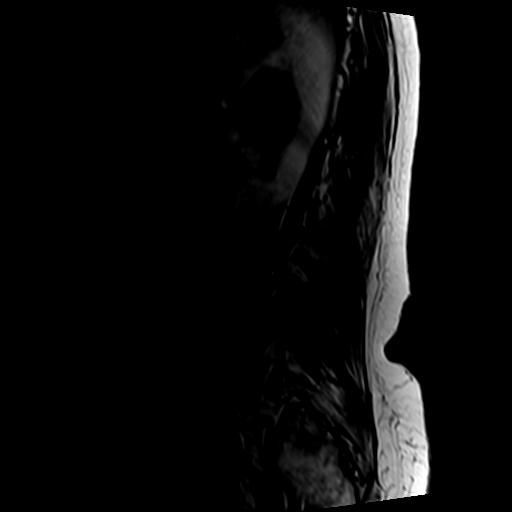
[im 3/15]
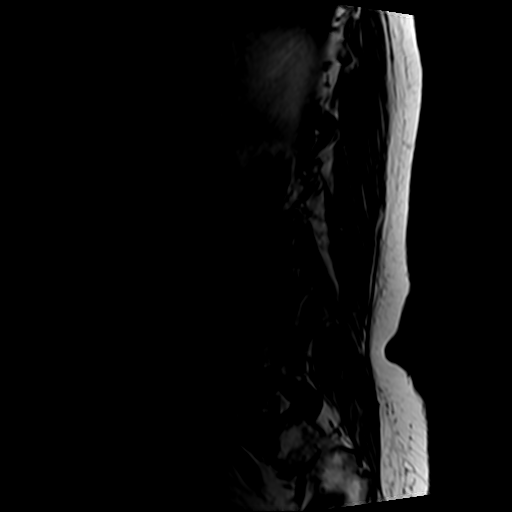
[im 6/15]
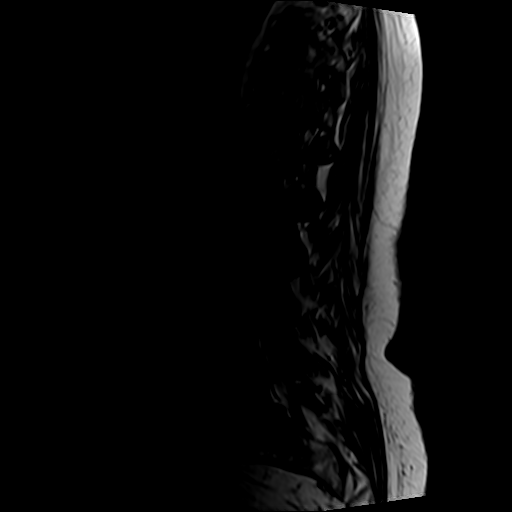
[im 9/15]
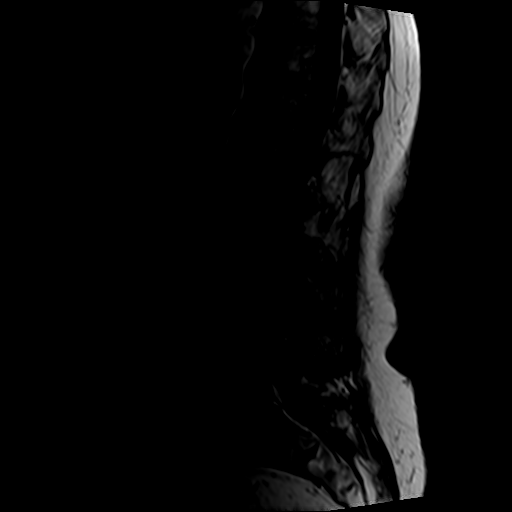
[im 12/15]
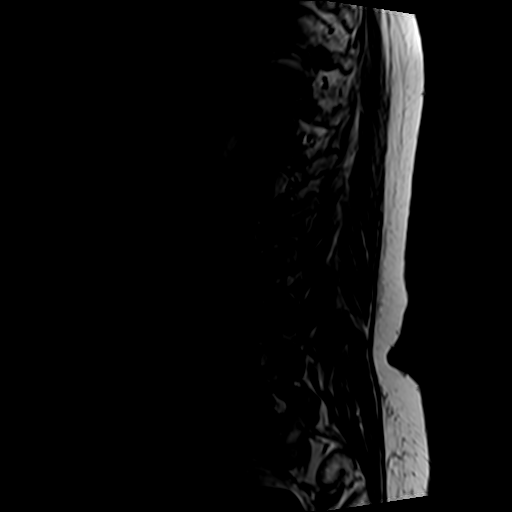
[im 15/15]
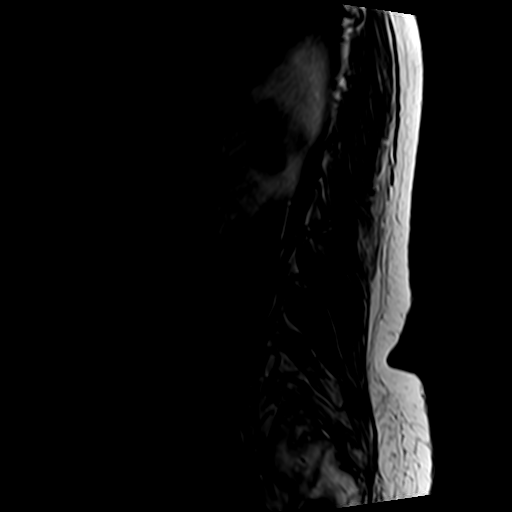

[Series 6: T2 · axial · 4.0mm · 0.70mm/px · z∈[-65,+126]mm · 9 of 35 slices shown (2 of 2)]
[im 1/35]
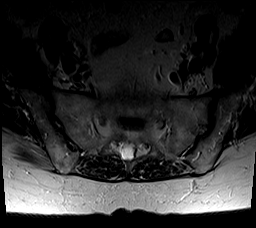
[im 5/35]
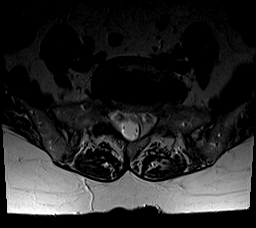
[im 10/35]
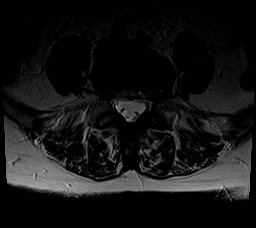
[im 15/35]
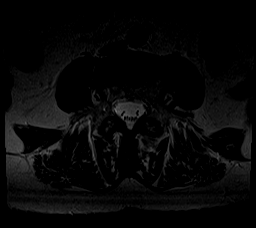
[im 18/35]
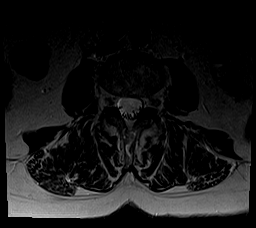
[im 20/35]
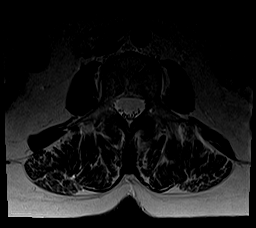
[im 25/35]
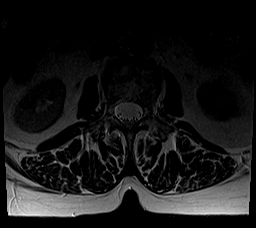
[im 30/35]
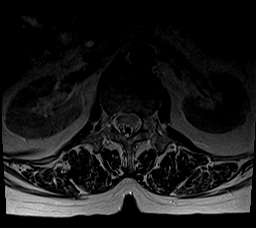
[im 35/35]
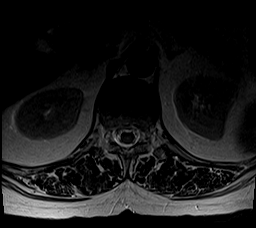

[Series 7: T1 · axial · 4.0mm · 0.35mm/px · z∈[-65,+100]mm · 5 of 35 slices shown (2 of 2)]
[im 1/35]
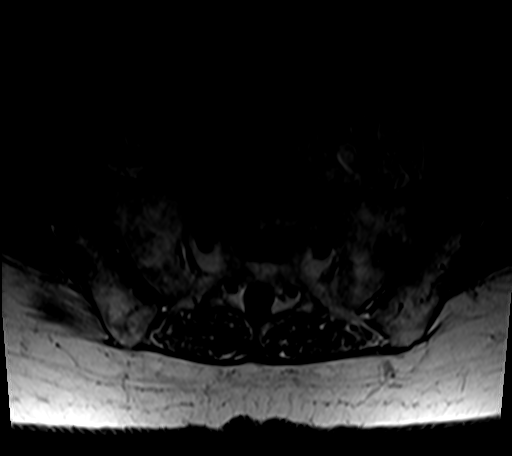
[im 5/35]
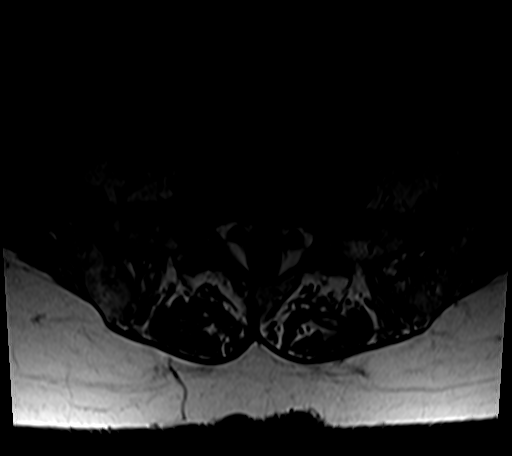
[im 10/35]
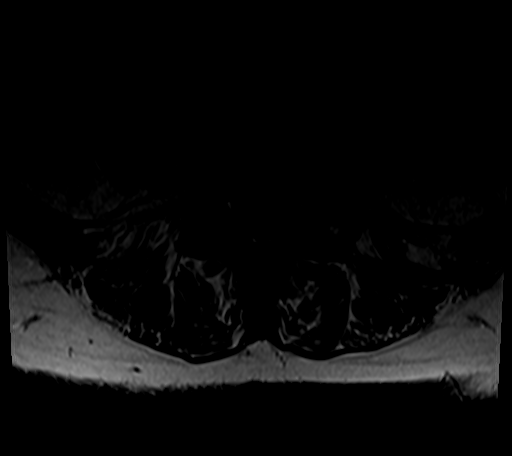
[im 18/35]
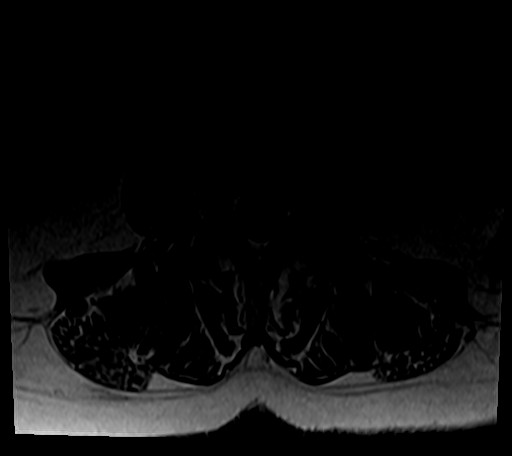
[im 30/35]
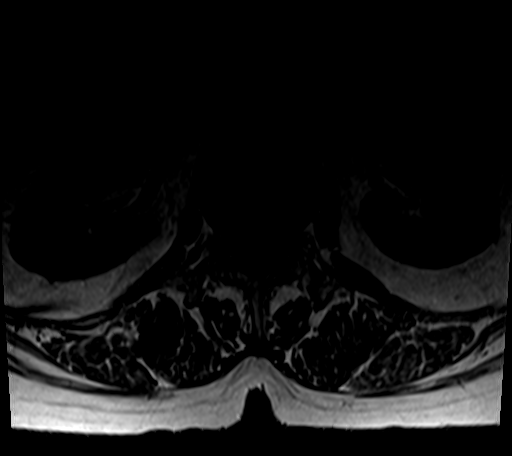

[26 of 48 positions shown; findings below may reference images not displayed]

FINDINGS: Segmentation: 5 non rib-bearing lumbar type vertebral bodies are
present. The lowest fully formed vertebral body is L5.

Alignment:  Slight anterolisthesis is noted at L3-4 and L4-5.

Vertebrae: Scattered fatty infiltration of the marrow space is
consistent with age. No focal lesions are present. Marrow signal and
vertebral body heights are within normal limits.

Conus medullaris and cauda equina: Conus extends to the L1 level.
Conus and cauda equina appear normal.

Paraspinal and other soft tissues: Limited imaging the abdomen is
unremarkable. There is no significant adenopathy. No solid organ
lesions are present.

Disc levels:

L1-2: Within normal limits.

L2-3: Within normal limits

L3-4: Mild disc bulging is asymmetric to the left. Mild facet
hypertrophy is noted bilaterally. The central canal is patent. Mild
left foraminal narrowing is present.

L4-5: A mild broad-based disc protrusion extends into the foramina
bilaterally. Mild bilateral facet hypertrophy is noted. Mild
foraminal narrowing is present bilaterally.

L5-S1: Minimal disc bulging is present. Mild left foraminal
narrowing is noted.
IMPRESSION: 1. Mild left foraminal narrowing at L3-4 and L5-S1.
2. Mild foraminal narrowing bilaterally at L4-5.
3. Slight anterolisthesis at L3-4 and L4-5.

## 2023-09-30 ENCOUNTER — Telehealth: Payer: Self-pay | Admitting: *Deleted

## 2023-09-30 NOTE — Telephone Encounter (Signed)
 Patient called to reschedule her appt. With Dr. Pricilla Holm this Friday at 2:45 to Friday, April 18 th at 3 pm.

## 2023-10-01 ENCOUNTER — Ambulatory Visit: Payer: PPO | Admitting: Gynecologic Oncology

## 2023-10-02 ENCOUNTER — Ambulatory Visit: Payer: PPO | Admitting: Gynecologic Oncology

## 2023-10-05 DIAGNOSIS — M5440 Lumbago with sciatica, unspecified side: Secondary | ICD-10-CM | POA: Diagnosis not present

## 2023-10-05 DIAGNOSIS — G20A1 Parkinson's disease without dyskinesia, without mention of fluctuations: Secondary | ICD-10-CM | POA: Diagnosis not present

## 2023-10-06 ENCOUNTER — Encounter: Payer: Self-pay | Admitting: Nurse Practitioner

## 2023-10-06 ENCOUNTER — Telehealth: Payer: Self-pay | Admitting: *Deleted

## 2023-10-06 ENCOUNTER — Telehealth (INDEPENDENT_AMBULATORY_CARE_PROVIDER_SITE_OTHER): Payer: PPO | Admitting: Nurse Practitioner

## 2023-10-06 DIAGNOSIS — I951 Orthostatic hypotension: Secondary | ICD-10-CM

## 2023-10-06 DIAGNOSIS — G8929 Other chronic pain: Secondary | ICD-10-CM

## 2023-10-06 DIAGNOSIS — G20A1 Parkinson's disease without dyskinesia, without mention of fluctuations: Secondary | ICD-10-CM | POA: Diagnosis not present

## 2023-10-06 DIAGNOSIS — F419 Anxiety disorder, unspecified: Secondary | ICD-10-CM | POA: Diagnosis not present

## 2023-10-06 DIAGNOSIS — F32A Depression, unspecified: Secondary | ICD-10-CM | POA: Diagnosis not present

## 2023-10-06 DIAGNOSIS — M5416 Radiculopathy, lumbar region: Secondary | ICD-10-CM | POA: Diagnosis not present

## 2023-10-06 DIAGNOSIS — F3341 Major depressive disorder, recurrent, in partial remission: Secondary | ICD-10-CM

## 2023-10-06 MED ORDER — HYDROCODONE-ACETAMINOPHEN 5-325 MG PO TABS: 1.0000 | ORAL_TABLET | Freq: Four times a day (QID) | ORAL | 0 refills | Status: DC | PRN

## 2023-10-06 NOTE — Progress Notes (Signed)
 Careteam: Patient Care Team: [name], NP as PCP - General (Geriatric Medicine) Corky Crafts, MD as PCP - Cardiology (Cardiology)  Advanced Directive information    Allergies  Allergen Reactions   Codeine Other (See Comments)    Nausea     Chief Complaint  Patient presents with   Back Pain    Complains of Back Pain. Neurosurgeon does not wants to see her and Pain Management will not accept her.       Discussed the use of AI scribe software for clinical note transcription with the patient, who gave verbal consent to proceed.  History of Present Illness   Ashley Neal is a 78 year old female with chronic back pain and Parkinson's disease who presents with severe pain management issues.  She experiences severe and chronic back pain, primarily in the lower lumbar region, persisting for nearly three years. The pain is described as excruciating and incapacitating, worsening with walking and improving with sleeping. She has tried various medications including oxycodone, Xanax, gabapentin, and tramadol, with minimal relief and adverse effects such as tinnitus, dull headache, and blurry vision with gabapentin. She has not been able to find a pain management provider who will accept her.  She has a history of Parkinson's disease, which has worsened, contributing to her inability to drive due to high blood pressure and dizziness. She uses a wheelchair for mobility as her blood pressure drops upon standing, making a walker impractical. Her independence is significantly compromised.  She reports blood pressure contributing to dizziness and inability to drive, affecting her overall quality of life.  She lives with her son, who is not much help due to his own problems, and lacks other support. A friend occasionally helps her with mobility. She drinks at least five bottles of plain water daily, along with other fluids, and takes dopamine five times a day. She is currently  on Lexapro for anxiety and depression but has not tried other medications like Cymbalta or Effexor.    Review of Systems:  Review of Systems  Constitutional:  Negative for chills, fever and weight loss.  HENT:  Negative for tinnitus.   Respiratory:  Negative for cough, sputum production and shortness of breath.   Cardiovascular:  Negative for chest pain, palpitations and leg swelling.  Gastrointestinal:  Negative for abdominal pain, constipation, diarrhea and heartburn.  Genitourinary:  Negative for dysuria, frequency and urgency.  Musculoskeletal:  Positive for back pain. Negative for falls, joint pain and myalgias.  Skin: Negative.   Neurological:  Positive for dizziness, tremors and weakness. Negative for headaches.  Psychiatric/Behavioral:  Positive for depression. Negative for memory loss. The patient does not have insomnia.     Past Medical History:  Diagnosis Date   Anxiety    Basal cell carcinoma    Depression    Hypertension    Neuromuscular disorder (HCC)    Osteoarthritis    Osteopenia    Parkinson disease (HCC)    Pneumonia    Tremors of nervous system    legs   uterine ca 10/08/2021   also endometrial cancer   Past Surgical History:  Procedure Laterality Date   CARPAL TUNNEL RELEASE Bilateral 1996/1997   CYSTOSCOPY N/A 10/08/2021   Procedure: CYSTOSCOPY;  Surgeon: Carver Fila, MD;  Location: [facility];  Service: Gynecology;  Laterality: N/A;   ROBOTIC ASSISTED TOTAL HYSTERECTOMY WITH BILATERAL SALPINGO OOPHERECTOMY N/A 10/08/2021   Procedure: XI ROBOTIC ASSISTED TOTAL HYSTERECTOMY WITH BILATERAL SALPINGO OOPHORECTOMY;SENTINEL LYMPH  NODE INJECTION;  Surgeon: Carver Fila, MD;  Location: [facility];  Service: Gynecology;  Laterality: N/A;   SQUAMOUS CELL CARCINOMA EXCISION Left 2017   Left jawline   TUBAL LIGATION  1980's   Social History:   reports that she has never smoked. She has never been exposed to tobacco smoke. She has never used smokeless  tobacco. She reports that she does not currently use alcohol. She reports that she does not currently use drugs.  Family History  Problem Relation Age of Onset   Endometrial cancer Mother    Diabetes Mother    Alzheimer's disease Father    Coronary artery disease Father    Diabetes Sister    COPD Sister    Diabetes Sister    Heart disease Brother    Other Brother        MVA    Parkinson's disease Brother    Heart disease Brother    Alcohol abuse Brother    Heart disease Brother    Parkinson's disease Maternal Aunt    Breast cancer Maternal Aunt    Colon cancer Paternal Aunt    Parkinson's disease Paternal Aunt    Lung cancer Paternal Uncle    Healthy Son    Healthy Son    Prostate cancer Neg Hx    Pancreatic cancer Neg Hx    Ovarian cancer Neg Hx     Medications: Patient's Medications  New Prescriptions   No medications on file  Previous Medications   CARBIDOPA-LEVODOPA (SINEMET IR) 25-100 MG TABLET    Take 1 tablet by mouth 5 (five) times daily.   ESCITALOPRAM (LEXAPRO) 20 MG TABLET    Take 20 mg by mouth daily.  Modified Medications   No medications on file  Discontinued Medications   No medications on file    Physical Exam:  There were no vitals filed for this visit. There is no height or weight on file to calculate BMI. Wt Readings from Last 3 Encounters:  08/24/23 155 lb (70.3 kg)  06/19/23 145 lb (65.8 kg)  05/29/23 145 lb 3.2 oz (65.9 kg)    Physical Exam Constitutional:      Appearance: Normal appearance.  Pulmonary:     Effort: Pulmonary effort is normal.  Neurological:     Mental Status: She is alert. Mental status is at baseline.  Psychiatric:        Mood and Affect: Mood normal. Affect is tearful.     Labs reviewed: Basic Metabolic Panel: Recent Labs    05/27/23 2329 06/19/23 1430 07/13/23 1040  NA 138 140 140  K 3.4* 4.4 3.9  CL 102 105 104  CO2 25 28 29   GLUCOSE 108* 137 115*  BUN 8 7 14   CREATININE 1.00 1.03* 0.77  CALCIUM  9.7 10.4 9.6   Liver Function Tests: Recent Labs    11/14/22 1427 05/26/23 1242  AST 12 9*  ALT 5* <5  ALKPHOS  --  79  BILITOT 0.5 1.1  PROT 6.2 6.4*  ALBUMIN  --  4.0   Recent Labs    05/26/23 1242  LIPASE 31   No results for input(s): "AMMONIA" in the last 8760 hours. CBC: Recent Labs    11/14/22 1427 05/26/23 1242 05/27/23 2329  WBC 5.8 4.9 4.6  NEUTROABS 3,608 3.6 2.9  HGB 12.9 13.6 13.6  HCT 38.0 39.9 38.8  MCV 91.6 88.7 86.2  PLT 249 205 228   Lipid Panel: Recent Labs    11/14/22 1427  CHOL 190  HDL 53  LDLCALC 86  TRIG 386*  CHOLHDL 3.6   TSH: No results for input(s): "TSH" in the last 8760 hours. A1C: No results found for: "HGBA1C"   Assessment/Plan Assessment and Plan    Chronic Back Pain Severe pain, previously evaluated by neurosurgery and deemed not a candidate for ablation. Previous trials of oxycodone and gabapentin were ineffective or poorly tolerated. Tramadol provided slight relief. -Start trial of hydrocodone-acetaminophen for pain control. -Refer to pain management for further evaluation and management.  Parkinson's Disease Worsening symptoms, impacting mobility and independence. Currently using a wheelchair for mobility. -Continue current Parkinson's medications. -Consider care management referral for additional support and resources.  Anxiety/Depression Currently on Lexapro. Discussed potential switch to Cymbalta or Effexor, which may also help with pain, but patient prefers to avoid additional change at this time. -Continue Lexapro as currently prescribed.  Orthostatic hypotension Due to parkinson's disease limiting mobility   Follow-up Schedule virtual visit in one week to assess response to hydrocodone-acetaminophen and discuss further management options.    Janene Harvey. Biagio Borg  [facility] [phone]    Virtual Visit via video  I connected with patient on 10/06/23 at  9:40 AM  EST by mychart and verified that I am speaking with the correct person using two identifiers.  Location: Patient: home Provider: [place]   I discussed the limitations, risks, security and privacy concerns of performing an evaluation and management service by telephone and the availability of in person appointments. I also discussed with the patient that there may be a patient responsible charge related to this service. The patient expressed understanding and agreed to proceed.   I discussed the assessment and treatment plan with the patient. The patient was provided an opportunity to ask questions and all were answered. The patient agreed with the plan and demonstrated an understanding of the instructions.   The patient was advised to call back or seek an in-person evaluation if the symptoms worsen or if the condition fails to improve as anticipated.  I provided 25 minutes of non-face-to-face time during this encounter.  Janene Harvey. Biagio Borg Avs printed and mailed

## 2023-10-06 NOTE — Progress Notes (Signed)
  This service is provided via telemedicine  No vital signs collected/recorded due to the encounter was a telemedicine visit.   Location of patient (ex: home, work):  Home  Patient consents to a telephone visit:  Yes  Location of the provider (ex: office, home):  Office [place].   Name of any referring provider:  na  Names of all persons participating in the telemedicine service and their role in the encounter:  Ala Dach, Patient, Nelda Severe, CMA, Abbey Chatters, NP  Time spent on call:  7:11

## 2023-10-06 NOTE — Telephone Encounter (Signed)
 Ms. Ashley Neal, Ashley Neal are scheduled for a virtual visit with your provider today.    Just as we do with appointments in the office, we must obtain your consent to participate.  Your consent will be active for this visit and any virtual visit you Ashley Neal have with one of our providers in the next 365 days.    If you have a MyChart account, I can also send a copy of this consent to you electronically.  All virtual visits are billed to your insurance company just like a traditional visit in the office.  As this is a virtual visit, video technology does not allow for your provider to perform a traditional examination.  This Ashley Neal limit your provider's ability to fully assess your condition.  If your provider identifies any concerns that need to be evaluated in person or the need to arrange testing such as labs, EKG, etc, we will make arrangements to do so.    Although advances in technology are sophisticated, we cannot ensure that it will always work on either your end or our end.  If the connection with a video visit is poor, we Ashley Neal have to switch to a telephone visit.  With either a video or telephone visit, we are not always able to ensure that we have a secure connection.   I need to obtain your verbal consent now.   Are you willing to proceed with your visit today?   Ashley Neal has provided verbal consent on 10/06/2023 for a virtual visit (video or telephone).   Ashley Neal, [place] 10/06/2023  11:13 AM

## 2023-10-07 ENCOUNTER — Telehealth: Payer: Self-pay

## 2023-10-07 NOTE — Telephone Encounter (Signed)
 Left message on voicemail for patient to return call when available

## 2023-10-07 NOTE — Telephone Encounter (Signed)
 Patient left a voicemail on yesterday and spoke with patient this morning stating the she had spoken with [name], NP about she tested for positive for UTI and wanted to ask [name], NP if she can she an antibiotics to her pharmacy that is [facility] [address].  Please Advise   Message sent to [name], NP

## 2023-10-07 NOTE — Telephone Encounter (Signed)
 She will need to come to office for UA C&S

## 2023-10-08 ENCOUNTER — Telehealth: Payer: Self-pay | Admitting: *Deleted

## 2023-10-08 NOTE — Progress Notes (Signed)
 Complex Care Management Note Care Guide Note  10/08/2023 Name: ADAHLIA STEMBRIDGE MRN: [id number] DOB: 21-Oct-1945   Complex Care Management Outreach Attempts: An unsuccessful telephone outreach was attempted today to offer the patient information about available complex care management services.  Follow Up Plan:  Additional outreach attempts will be made to offer the patient complex care management information and services.   Encounter Outcome:  No Answer  Gwenevere Ghazi  [facility]  [facility], [facility] Guide  Direct Dial: [phone]  Fax [phone]

## 2023-10-12 NOTE — Progress Notes (Unsigned)
 Complex Care Management Note Care Guide Note  10/12/2023 Name: Ashley Neal MRN: [id number] DOB: [birth year]   Complex Care Management Outreach Attempts: A second unsuccessful outreach was attempted today to offer the patient with information about available complex care management services.  Follow Up Plan:  Additional outreach attempts will be made to offer the patient complex care management information and services.   Encounter Outcome:  No Answer  Gwenevere Ghazi  [facility]  [facility] Guide  Direct Dial: [phone]  Fax [phone]

## 2023-10-13 ENCOUNTER — Encounter: Payer: Self-pay | Admitting: Nurse Practitioner

## 2023-10-13 ENCOUNTER — Telehealth (INDEPENDENT_AMBULATORY_CARE_PROVIDER_SITE_OTHER): Payer: PPO | Admitting: Nurse Practitioner

## 2023-10-13 VITALS — BP 158/98 | HR 95

## 2023-10-13 DIAGNOSIS — M5416 Radiculopathy, lumbar region: Secondary | ICD-10-CM | POA: Diagnosis not present

## 2023-10-13 NOTE — Progress Notes (Unsigned)
  This service is provided via telemedicine  No vital signs collected/recorded due to the encounter was a telemedicine visit.   Location of patient (ex: home, work):  Home  Patient consents to a telephone visit:  Yes  Location of the provider (ex: office, home):  Office [place].   Name of any referring provider:  na  Names of all persons participating in the telemedicine service and their role in the encounter:  Ala Dach, Patient, Nelda Severe, CMA, Abbey Chatters, NP  Time spent on call:  7:11

## 2023-10-13 NOTE — Progress Notes (Unsigned)
 Careteam: Patient Care Team: [name], NP as PCP - General (Geriatric Medicine) Corky Crafts, MD as PCP - Cardiology (Cardiology)  Advanced Directive information    Allergies  Allergen Reactions   Codeine Other (See Comments)    Nausea     Chief Complaint  Patient presents with   Back Pain    Follow up Chronic Back Pain. Pain Medication is not relieving pain. Causing Blood pressure to go up and a constant dull headache and ringing in ears.     Discussed the use of AI scribe software for clinical note transcription with the patient, who gave verbal consent to proceed.  History of Present Illness   Ashley Neal is a 78 year old female who presents for follow-up on pain management.  She experiences chronic low back pain that has been persistent and debilitating. Initially localized to the lower lumbar region, the pain has now spread throughout the entire lumbar area. It is described as stabbing and is exacerbated by muscle contractions, which she finds 'breathtaking' and stops her in her tracks, making it difficult to perform daily activities.  She has tried multiple medications for pain management, including hydrocodone, oxycodone, tramadol, and gabapentin, lyrica all of which she could not tolerate due to side effects such as dizziness, tinnitus, and dull headaches. Hydrocodone alleviated the pain somewhat but left a burning sensation and increased muscular pain, along with significant dizziness that impaired her ability to walk. She also experienced high blood pressure while on hydrocodone, with a recent reading of 158/98 mmHg and a pulse of 95 bpm. Pregabalin (Lyrica) was also tried but caused similar side effects, including dizziness and a dull headache.  She has attempted over-the-counter medications such as Tylenol combined with ibuprofen, but these have not provided significant relief. She is currently not on any effective pain management regimen.       Review of Systems:  Review of Systems  Constitutional:  Positive for malaise/fatigue. Negative for chills and fever.  Musculoskeletal:  Positive for back pain and myalgias.  Neurological:  Positive for dizziness and weakness.  Psychiatric/Behavioral:  Positive for depression. The patient is nervous/anxious.    Past Medical History:  Diagnosis Date   Anxiety    Basal cell carcinoma    Depression    Hypertension    Neuromuscular disorder (HCC)    Osteoarthritis    Osteopenia    Parkinson disease (HCC)    Pneumonia    Tremors of nervous system    legs   uterine ca 10/08/2021   also endometrial cancer   Past Surgical History:  Procedure Laterality Date   CARPAL TUNNEL RELEASE Bilateral 1996/1997   CYSTOSCOPY N/A 10/08/2021   Procedure: CYSTOSCOPY;  Surgeon: Carver Fila, MD;  Location: [facility];  Service: Gynecology;  Laterality: N/A;   ROBOTIC ASSISTED TOTAL HYSTERECTOMY WITH BILATERAL SALPINGO OOPHERECTOMY N/A 10/08/2021   Procedure: XI ROBOTIC ASSISTED TOTAL HYSTERECTOMY WITH BILATERAL SALPINGO OOPHORECTOMY;SENTINEL LYMPH NODE INJECTION;  Surgeon: Carver Fila, MD;  Location: [facility];  Service: Gynecology;  Laterality: N/A;   SQUAMOUS CELL CARCINOMA EXCISION Left 2017   Left jawline   TUBAL LIGATION  1980's   Social History:   reports that she has never smoked. She has never been exposed to tobacco smoke. She has never used smokeless tobacco. She reports that she does not currently use alcohol. She reports that she does not currently use drugs.  Family History  Problem Relation Age of Onset   Endometrial cancer  Mother    Diabetes Mother    Alzheimer's disease Father    Coronary artery disease Father    Diabetes Sister    COPD Sister    Diabetes Sister    Heart disease Brother    Other Brother        MVA    Parkinson's disease Brother    Heart disease Brother    Alcohol abuse Brother    Heart disease Brother    Parkinson's disease Maternal Aunt     Breast cancer Maternal Aunt    Colon cancer Paternal Aunt    Parkinson's disease Paternal Aunt    Lung cancer Paternal Uncle    Healthy Son    Healthy Son    Prostate cancer Neg Hx    Pancreatic cancer Neg Hx    Ovarian cancer Neg Hx     Medications: Patient's Medications  New Prescriptions   No medications on file  Previous Medications   CARBIDOPA-LEVODOPA (SINEMET IR) 25-100 MG TABLET    Take 1 tablet by mouth 5 (five) times daily.   ESCITALOPRAM (LEXAPRO) 20 MG TABLET    Take 20 mg by mouth daily.   HYDROCODONE-ACETAMINOPHEN (NORCO/VICODIN) 5-325 MG TABLET    Take 1 tablet by mouth every 6 (six) hours as needed for moderate pain (pain score 4-6).  Modified Medications   No medications on file  Discontinued Medications   No medications on file    Physical Exam:  Vitals:   10/13/23 0926  BP: (!) 158/98  Pulse: 95   There is no height or weight on file to calculate BMI. Wt Readings from Last 3 Encounters:  08/24/23 155 lb (70.3 kg)  06/19/23 145 lb (65.8 kg)  05/29/23 145 lb 3.2 oz (65.9 kg)    Physical Exam Constitutional:      Appearance: Normal appearance.  Pulmonary:     Effort: Pulmonary effort is normal.  Neurological:     Mental Status: She is alert and oriented to person, place, and time. Mental status is at baseline.  Psychiatric:        Mood and Affect: Mood normal.     Labs reviewed: Basic Metabolic Panel: Recent Labs    05/27/23 2329 06/19/23 1430 07/13/23 1040  NA 138 140 140  K 3.4* 4.4 3.9  CL 102 105 104  CO2 25 28 29   GLUCOSE 108* 137 115*  BUN 8 7 14   CREATININE 1.00 1.03* 0.77  CALCIUM 9.7 10.4 9.6   Liver Function Tests: Recent Labs    11/14/22 1427 05/26/23 1242  AST 12 9*  ALT 5* <5  ALKPHOS  --  79  BILITOT 0.5 1.1  PROT 6.2 6.4*  ALBUMIN  --  4.0   Recent Labs    05/26/23 1242  LIPASE 31   No results for input(s): "AMMONIA" in the last 8760 hours. CBC: Recent Labs    11/14/22 1427 05/26/23 1242  05/27/23 2329  WBC 5.8 4.9 4.6  NEUTROABS 3,608 3.6 2.9  HGB 12.9 13.6 13.6  HCT 38.0 39.9 38.8  MCV 91.6 88.7 86.2  PLT 249 205 228   Lipid Panel: Recent Labs    11/14/22 1427  CHOL 190  HDL 53  LDLCALC 86  TRIG 386*  CHOLHDL 3.6   TSH: No results for input(s): "TSH" in the last 8760 hours. A1C: No results found for: "HGBA1C"   Assessment/Plan     Chronic Low Back Pain Severe, stabbing pain in the lumbar region, unresponsive to multiple medications including  hydrocodone, oxycodone, tramadol, gabapentin, and pregabalin due to intolerable side effects (dizziness, tinnitus, headache). Over-the-counter combination of acetaminophen and ibuprofen also ineffective. -Initiate trial of acetaminophen 1000mg  TID. -Referral to pain management for further evaluation and management. -Follow-up in office on May 19th at 1:40pm or sooner if needed.      Janene Harvey. Biagio Borg  [facility] [phone]    Virtual Visit via video  I connected with patient on 10/13/23 at  9:40 AM EST by mychart and verified that I am speaking with the correct person using two identifiers.  Location: Patient: home Provider: [facility] clinic   I discussed the limitations, risks, security and privacy concerns of performing an evaluation and management service by telephone and the availability of in person appointments. I also discussed with the patient that there may be a patient responsible charge related to this service. The patient expressed understanding and agreed to proceed.   I discussed the assessment and treatment plan with the patient. The patient was provided an opportunity to ask questions and all were answered. The patient agreed with the plan and demonstrated an understanding of the instructions.   The patient was advised to call back or seek an in-person evaluation if the symptoms worsen or if the condition fails to improve as anticipated.  I provided 20 minutes  of non-face-to-face time during this encounter.  Janene Harvey. Biagio Borg Avs printed and mailed

## 2023-10-13 NOTE — Progress Notes (Signed)
 Complex Care Management Note  Care Guide Note 10/13/2023 Name: [name] MRN: [id number] DOB: May 31, 1946  Ashley Neal is a 78 y.o. year old female who sees Eubanks, Janene Harvey, NP for primary care. I reached out to Ashley Neal by phone today to offer complex care management services.  [name] was given information about Complex Care Management services today including:   The Complex Care Management services include support from the care team which includes your Nurse Care Manager, Clinical Social Worker, or Pharmacist.  The Complex Care Management team is here to help remove barriers to the health concerns and goals most important to you. Complex Care Management services are voluntary, and the patient may decline or stop services at any time by request to their care team member.   Complex Care Management Consent Status: Patient agreed to services and verbal consent obtained.   Follow up plan:  Telephone appointment with complex care management team member scheduled for:  3/6  Encounter Outcome:  Patient Scheduled  Gwenevere Ghazi  [facility]  [facility], [facility] Guide  Direct Dial: [phone]  Fax [phone]

## 2023-10-15 ENCOUNTER — Ambulatory Visit: Payer: Self-pay

## 2023-10-15 NOTE — Patient Instructions (Signed)
 Visit Information  Thank you for taking time to visit with me today. Please don't hesitate to contact me if I can be of assistance to you.   Following are the goals we discussed today:   Goals Addressed             This Visit's Progress    Patient Stated- she needs help with navigtion of the health system for help with pain       Patient Goals/Self Care Activities: -Patient/Caregiver will take medications as prescribed   -Patient/Caregiver will attend all scheduled provider appointments -Patient/Caregiver will call pharmacy for medication refills 3-7 days in advance of running out of medications -Patient/Caregiver will call provider office for new concerns or questions  -Patient/Caregiver will focus on medication adherence by taking medications as prescribed  *patient will use heating pad or warm towel on painful area no more than 20 minutes at a time..Don't sleep with a heating pad on *Use an ice pack or frozen bag of vegetables in painful areas. Leave the cold pack on for less than 20 minutes. Ice can burn your skin.   *patient will attend all scheduled medical appointments patients will use pharmacological and nonpharmacological pain relief strategies like: creams, patches and supplements. Prayer and meditation can decrease pain Listening to music can decrease pain. *patient will engage in desired activities without an increase in pain level           Our next appointment is by telephone on 10/21/23 at 915 am  Please call the care guide team at [phone] if you need to cancel or reschedule your appointment.   If you are experiencing a Mental Health or Behavioral Health Crisis or need someone to talk to, please call 1-800-273-TALK (toll free, 24 hour hotline)  Patient verbalizes understanding of instructions and care plan provided today and agrees to view in MyChart. Active MyChart status and patient understanding of how to access instructions and care plan via MyChart  confirmed with patient.     Juanell Fairly RN, BSN, [facility] [place]  [facility], [facility]  Care Coordinator Phone: [phone]

## 2023-10-15 NOTE — Patient Outreach (Addendum)
 Care Coordination   Initial Visit Note   10/15/2023 Name: Ashley Neal MRN: [id number] DOB: August 02, 1946  Ashley Neal is a 78 y.o. year old female who sees Eubanks, Janene Harvey, NP for primary care. I spoke with  Ashley Neal by phone today.  What matters to the patients health and wellness today?  Ashley Neal, a 78 year old resident living with her son, is experiencing persistent lower back pain that radiates to her coccyx and hip. She has been diagnosed with Parkinson's disease and reports her pain level to be approximately 7 out of 10. Although Ashley Neal endeavors to engage in yoga and exercise, she finds it challenging to walk long distances.   Her most recent vital signs indicate a blood pressure reading of 148/68 and a pulse rate of 95. Currently, Ashley Neal is prescribed levodopa and Lexapro, yet she expresses a preference for minimizing her medication intake. Despite her efforts to alleviate her pain through various interventions--including cortisone injections, needling, and the use of heat and cold therapies--she has not achieved significant relief. Additionally, she is taking Tylenol. I plan to follow up with her next week to explore further treatment options. She needs help navigating the health system for help with her pain.     Goals Addressed             This Visit's Progress    Patient Stated- she needs help with navigtion of the health system for help with pain       Patient Goals/Self Care Activities: -Patient/Caregiver will take medications as prescribed   -Patient/Caregiver will attend all scheduled provider appointments -Patient/Caregiver will call pharmacy for medication refills 3-7 days in advance of running out of medications -Patient/Caregiver will call provider office for new concerns or questions  -Patient/Caregiver will focus on medication adherence by taking medications as prescribed  *patient will use heating pad or warm towel on painful  area no more than 20 minutes at a time..Don't sleep with a heating pad on *Use an ice pack or frozen bag of vegetables in painful areas. Leave the cold pack on for less than 20 minutes. Ice can burn your skin.   *patient will attend all scheduled medical appointments patients will use pharmacological and nonpharmacological pain relief strategies like: creams, patches and supplements. Prayer and meditation can decrease pain Listening to music can decrease pain. *patient will engage in desired activities without an increase in pain level           SDOH assessments and interventions completed:  No     Care Coordination Interventions:  Yes, provided   Interventions Today    Flowsheet Row Most Recent Value  Chronic Disease   Chronic disease during today's visit Other  [Lumbar pain and navigation of health system]  General Interventions   General Interventions Discussed/Reviewed General Interventions Discussed, General Interventions Reviewed, Doctor Visits  Doctor Visits Discussed/Reviewed Doctor Visits Discussed  Exercise Interventions   Exercise Discussed/Reviewed Physical Activity  Pharmacy Interventions   Pharmacy Dicussed/Reviewed Pharmacy Topics Discussed  Safety Interventions   Safety Discussed/Reviewed Safety Discussed        Follow up plan: Follow up call scheduled for 10/21/23  915 am    Encounter Outcome:  Patient Visit Completed     Juanell Fairly RN, BSN, [facility]  [facility], [facility]  Care Coordinator Phone: [phone]

## 2023-10-16 ENCOUNTER — Telehealth: Payer: Self-pay

## 2023-10-16 DIAGNOSIS — M5440 Lumbago with sciatica, unspecified side: Secondary | ICD-10-CM | POA: Diagnosis not present

## 2023-10-16 DIAGNOSIS — G20A1 Parkinson's disease without dyskinesia, without mention of fluctuations: Secondary | ICD-10-CM | POA: Diagnosis not present

## 2023-10-16 NOTE — Progress Notes (Signed)
 Complex Care Management Note Care Guide Note  10/16/2023 Name: Ashley Neal MRN: [id number] DOB: September 06, 1945  Ashley Neal is a 78 y.o. year old female who is a primary care patient of [name], NP . The community resource team was consulted for assistance with Transportation Needs   SDOH screenings and interventions completed:  Yes  Social Drivers of Health From This Encounter   Food Insecurity: No Food Insecurity (10/16/2023)   Hunger Vital Sign    Worried About Running Out of Food in the Last Year: Never true    Ran Out of Food in the Last Year: Never true  Housing: Low Risk  (10/16/2023)   Housing Stability Vital Sign    Unable to Pay for Housing in the Last Year: No    Number of Times Moved in the Last Year: 0    Homeless in the Last Year: No  Financial Resource Strain: Low Risk  (10/16/2023)   Overall Financial Resource Strain (CARDIA)    Difficulty of Paying Living Expenses: Not very hard  Transportation Needs: Unmet Transportation Needs (10/16/2023)   PRAPARE - Administrator, Civil Service (Medical): Yes    Lack of Transportation (Non-Medical): No  Utilities: Not At Risk (10/16/2023)   Utilities    Threatened with loss of utilities: No    SDOH Interventions Today    Flowsheet Row Most Recent Value  SDOH Interventions   Transportation Interventions Other (Comment), SCAT (Specialized Community Area Transporation)  [Spoke to patient to complete Part A [employer] [place] application. Faxed Part B [employer] [place] to patient's PCP Ashley K. Janyth Contes, NP to be completed and faxed to [phone] [employer] [place] Eligibility.Received email confirming receipt of Part A application.]        Care guide performed the following interventions: Received email confirming receipt of Part A application.  Follow Up Plan:  No further follow up planned at this time. The patient has been provided with needed resources.  Encounter Outcome:  Patient Visit Completed  Davinci Glotfelty Sharol Roussel  Health  [facility] Guide Direct Dial: [phone]  Fax: [phone] Website: [email or web address]

## 2023-10-20 ENCOUNTER — Ambulatory Visit: Payer: PPO

## 2023-10-21 ENCOUNTER — Ambulatory Visit: Payer: Self-pay

## 2023-10-21 NOTE — Patient Outreach (Signed)
 Care Coordination   Follow Up Visit Note   10/21/2023 Name: SHAMRA BRADEEN MRN: [id number] DOB: 1946/01/10  [name] is a 78 y.o. year old female who sees Eubanks, Janene Harvey, NP for primary care. I spoke with  [name] by phone today.  What matters to the patients health and wellness today?  I  reached out to provide assistance in navigating the medical system with her [employer] [place] insurance to Mrs. Trainer. During our discussion, we reviewed several of her benefits. She stated that she possesses a standard wheelchair, which she has not utilized extensively at home, as well as a rollator that she finds difficult to operate.  We initially attempted to contact the number provided on her card; however, we were unable to connect. We subsequently called the [place] in [place], who referred [place] to an alternative number. We successfully reached [facility] through that line, but experienced an extensive hold time of one hour. Consequently, Mrs. Trainer opted to call them at a later time to inquire about the possibility of securing a Personal Care Worker (PCW) and obtaining assistance for home modifications.  I will follow up with her to assess her progress.      Goals Addressed             This Visit's Progress    Patient Stated- she needs help with navigtion of the health system for help with pain       Patient Goals/Self Care Activities: -Patient/Caregiver will take medications as prescribed   -Patient/Caregiver will attend all scheduled provider appointments -Patient/Caregiver will call pharmacy for medication refills 3-7 days in advance of running out of medications -Patient/Caregiver will call provider office for new concerns or questions  -Patient/Caregiver will focus on medication adherence by taking medications as prescribed  *patient will use heating pad or warm towel on painful area no more than 20 minutes at a time..Don't sleep with a heating pad on *Use an ice pack  or frozen bag of vegetables in painful areas. Leave the cold pack on for less than 20 minutes. Ice can burn your skin.   *patient will attend all scheduled medical appointments patients will use pharmacological and nonpharmacological pain relief strategies like: creams, patches and supplements. Prayer and meditation can decrease pain Listening to music can decrease pain. *patient will engage in desired activities without an increase in pain level   -call the number given given by [facility]-[phone]         SDOH assessments and interventions completed:  No     Care Coordination Interventions:  Yes, provided {THN Tip this will not be part of the note when signed-REQUIRED REPORT FIELD DO NOT DELETE (Optional):27901  ibuprofen Interventions Today    Flowsheet Row Most Recent Value  Chronic Disease   Chronic disease during today's visit Other  [Navigating the health system]  General Interventions   General Interventions Discussed/Reviewed General Interventions Discussed  Education Interventions   Education Provided Provided Education  Provided Verbal Education On Other  [called [facility] in [place]]  Safety Interventions   Safety Discussed/Reviewed Safety Discussed        Follow up plan: Follow up call scheduled for 11/25/23  9 am    Encounter Outcome:  Patient Visit Completed   Juanell Fairly RN, BSN, [facility]  [facility], [facility]  Care Coordinator Phone: [phone]

## 2023-10-21 NOTE — Patient Instructions (Signed)
 Visit Information  Thank you for taking time to visit with me today. Please don't hesitate to contact me if I can be of assistance to you.   Following are the goals we discussed today:   Goals Addressed             This Visit's Progress    Patient Stated- she needs help with navigtion of the health system for help with pain       Patient Goals/Self Care Activities: -Patient/Caregiver will take medications as prescribed   -Patient/Caregiver will attend all scheduled provider appointments -Patient/Caregiver will call pharmacy for medication refills 3-7 days in advance of running out of medications -Patient/Caregiver will call provider office for new concerns or questions  -Patient/Caregiver will focus on medication adherence by taking medications as prescribed  *patient will use heating pad or warm towel on painful area no more than 20 minutes at a time..Don't sleep with a heating pad on *Use an ice pack or frozen bag of vegetables in painful areas. Leave the cold pack on for less than 20 minutes. Ice can burn your skin.   *patient will attend all scheduled medical appointments patients will use pharmacological and nonpharmacological pain relief strategies like: creams, patches and supplements. Prayer and meditation can decrease pain Listening to music can decrease pain. *patient will engage in desired activities without an increase in pain level   -call the number given given by [phone]         Our next appointment is by telephone on 11/25/23 at 9 am  Please call the care guide team at [phone] if you need to cancel or reschedule your appointment.   If you are experiencing a Mental Health or Behavioral Health Crisis or need someone to talk to, please call 1-800-273-TALK (toll free, 24 hour hotline)  Patient verbalizes understanding of instructions and care plan provided today and agrees to view in MyChart. Active MyChart status and patient understanding  of how to access instructions and care plan via MyChart confirmed with patient.     Juanell Fairly RN, BSN, [facility]  [facility], [facility]  Care Coordinator Phone: [phone]

## 2023-10-23 ENCOUNTER — Telehealth: Payer: Self-pay | Admitting: *Deleted

## 2023-10-23 NOTE — Telephone Encounter (Signed)
 Received [employer] form from [employer] for patient.  Shanda Bumps filled out and signed.   Patient needs to sign the application before it is faxed.  Called and spoke with patient and she will come by one day next week and sign it and then have [place] fax it.

## 2023-11-05 ENCOUNTER — Telehealth: Payer: Self-pay

## 2023-11-05 NOTE — Telephone Encounter (Signed)
 She was already seen at [facility] should not need a new referral

## 2023-11-05 NOTE — Telephone Encounter (Signed)
 Copied from CRM [id number]. Topic: Referral - Request for Referral >> Nov 05, 2023 10:23 AM Dondra Prader A wrote: Did the patient discuss referral with their provider in the last year? Yes   Appointment offered? No  Type of order/referral and detailed reason for visit: Pain in lower spine  Preference of office, provider, location: Stefani Dama, MD, [address] [place], [place] [address] Phone#: [phone]   If referral order, have you been seen by this specialty before? Yes Patient has been seen at this location before by another physician.   Can we respond through MyChart? No

## 2023-11-05 NOTE — Telephone Encounter (Signed)
 Providers response sent via [employer]

## 2023-11-10 ENCOUNTER — Telehealth: Payer: Self-pay | Admitting: Nurse Practitioner

## 2023-11-10 NOTE — Telephone Encounter (Signed)
 Patient came in and dropped off form from [employer] to be completed by her PCP. Ask that it is faxed and emailed when completed

## 2023-11-10 NOTE — Telephone Encounter (Signed)
 Please give to CI to start

## 2023-11-12 ENCOUNTER — Telehealth: Payer: Self-pay | Admitting: Nurse Practitioner

## 2023-11-12 NOTE — Telephone Encounter (Signed)
 She already had the referral, she saw another provider in that practice unsure if it is allowed to see multiple providers at the same practice.  Please fwd to referral coordinator.

## 2023-11-12 NOTE — Telephone Encounter (Signed)
 Please see message below : 11/12/2023 Copied from CRM #[id number]. Topic: Referral - Request for Referral >> Nov 05, 2023 10:23 AM Dondra Prader A wrote: Did the patient discuss referral with their provider in the last year? Yes   Appointment offered? No  Type of order/referral and detailed reason for visit: Pain in lower spine  Preference of office, provider, location: Stefani Dama, MD, [address] [place], [place] [address] Phone#: [phone]   If referral order, have you been seen by this specialty before? Yes Patient has been seen at this location before by another physician.   Can we respond through MyChart? No >> Nov 12, 2023 10:57 AM Corin V wrote: Patient stated that because the old provider does not feel they can provide additional services to patient, the clinic is requiring a new referral for her to be possibly seen by Dr. Danielle Dess. Please send new referral so she can be considered for acceptance by Dr. Danielle Dess.

## 2023-11-16 IMAGING — CT CT ABD-PELV W/ CM
2 of 6 series · 16 of 46 positions shown, 18 images · IV contrast (OMNIPAQUE)
Comparison: None.

CLINICAL DATA: Uterine and cervical cancer.  Staging.

EXAM:
CT ABDOMEN AND PELVIS WITH CONTRAST
TECHNIQUE: Multidetector CT imaging of the abdomen and pelvis was performed
using the standard protocol following bolus administration of
intravenous contrast.

[Series 2: axial st · axial · 0.71mm/px · z∈[-404,-49]mm · 13 of 83 slices shown, 15 images]
[im 6/83  soft-tissue]
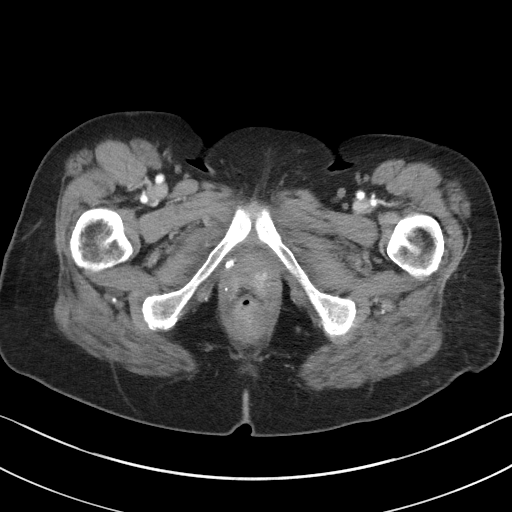
[im 6/83  bone]
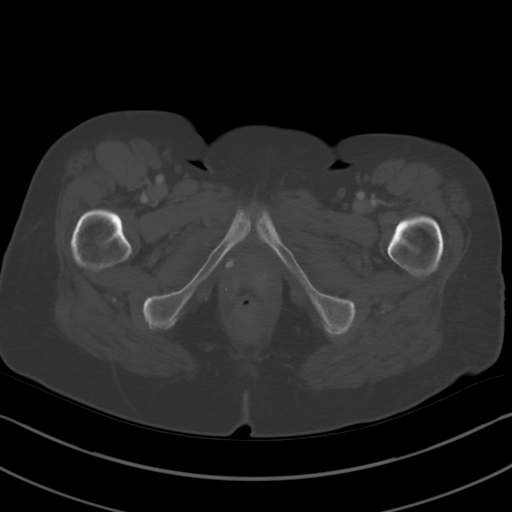
[im 12/83  soft-tissue]
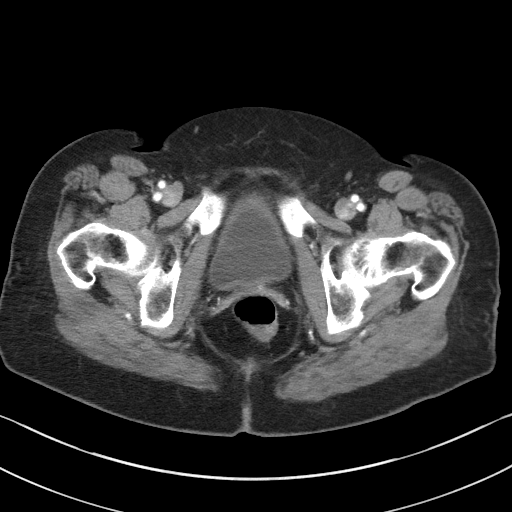
[im 18/83  soft-tissue]
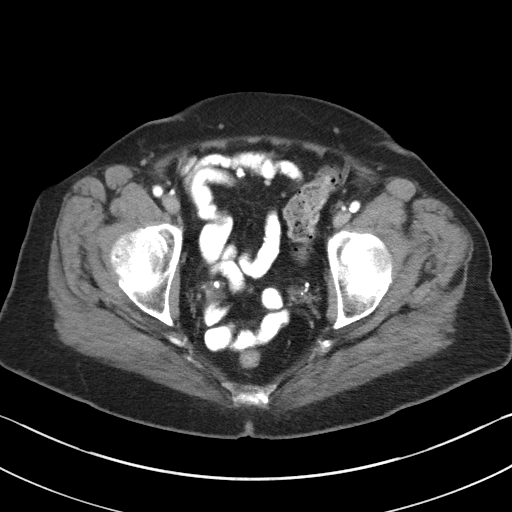
[im 24/83  soft-tissue]
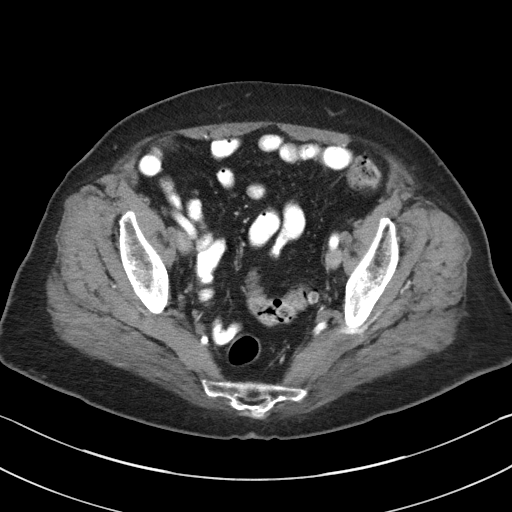
[im 30/83  soft-tissue]
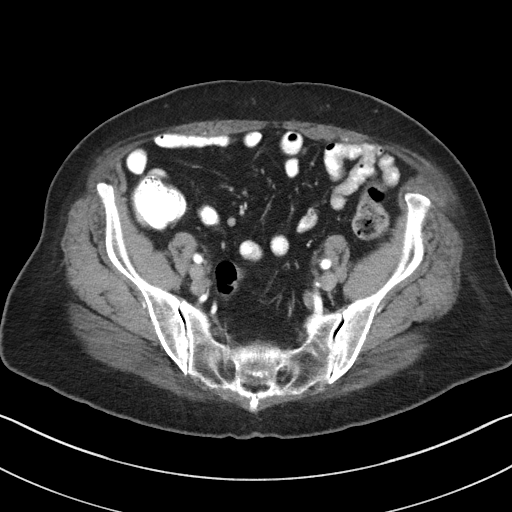
[im 36/83  soft-tissue]
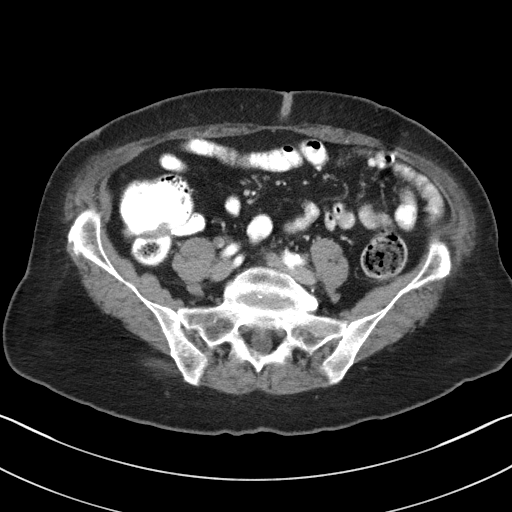
[im 42/83  soft-tissue]
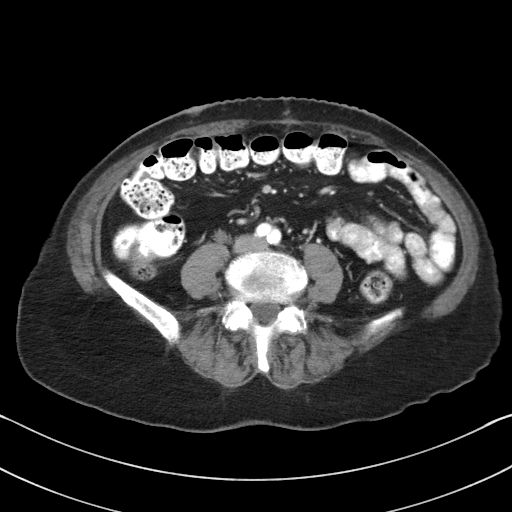
[im 47/83  soft-tissue]
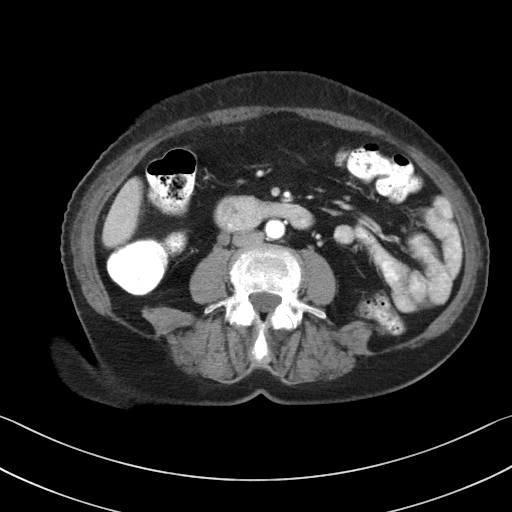
[im 53/83  soft-tissue]
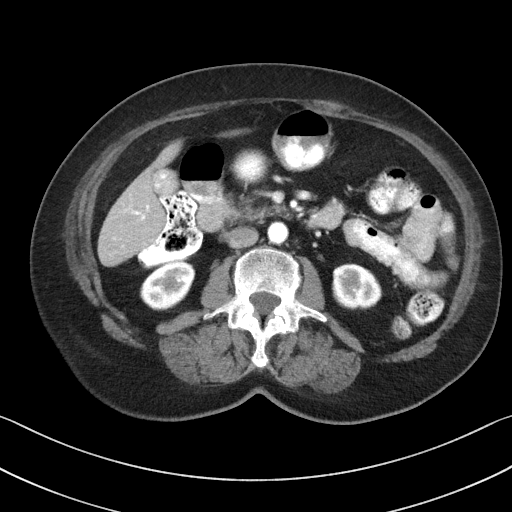
[im 53/83  bone]
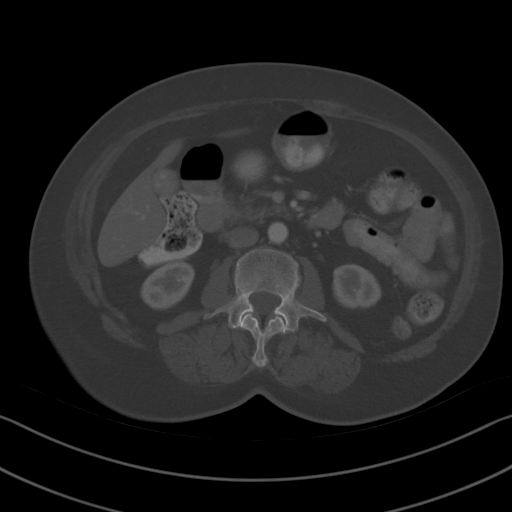
[im 59/83  soft-tissue]
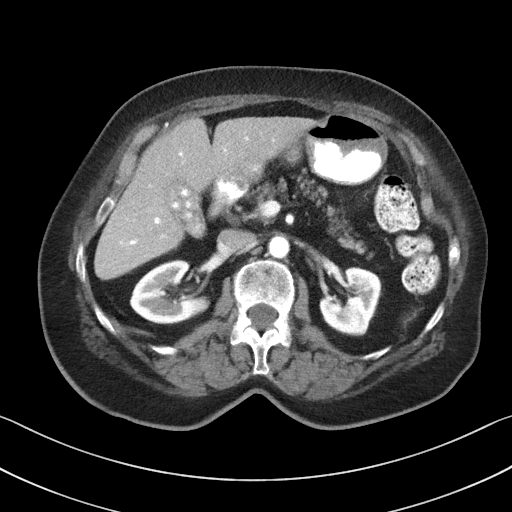
[im 65/83  soft-tissue]
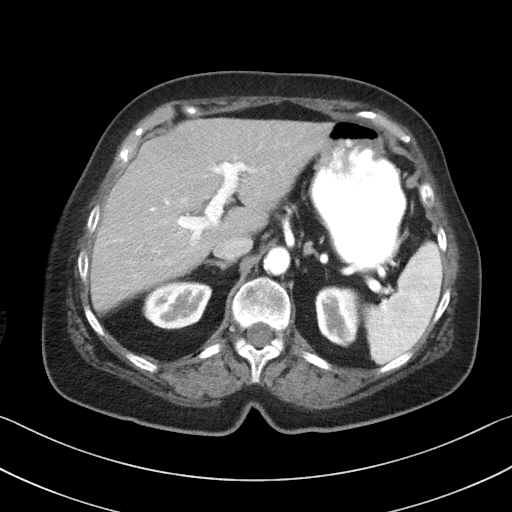
[im 71/83  soft-tissue]
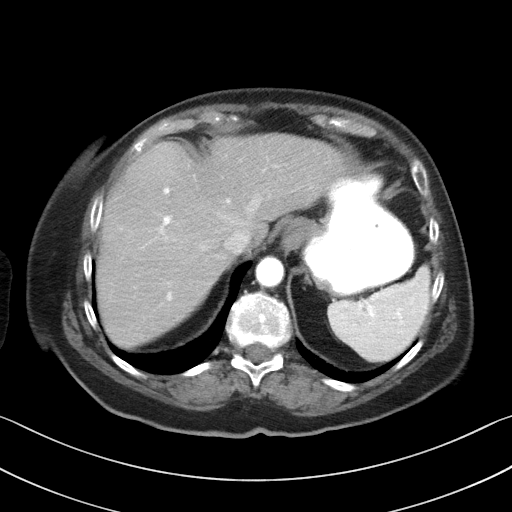
[im 77/83  soft-tissue]
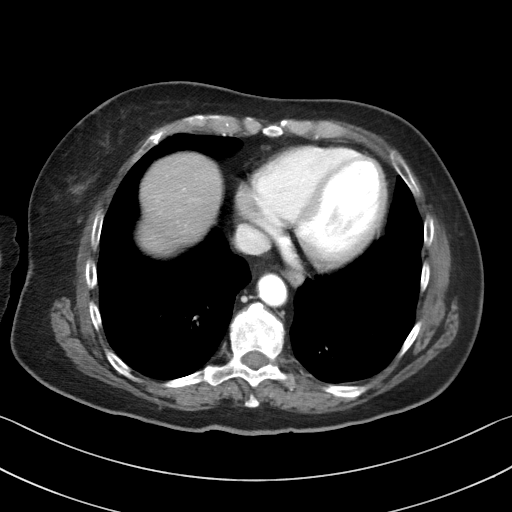

[Series 4: coronal st · coronal · 0.73mm/px · 3 of 84 slices shown]
[im 28/84  soft-tissue]
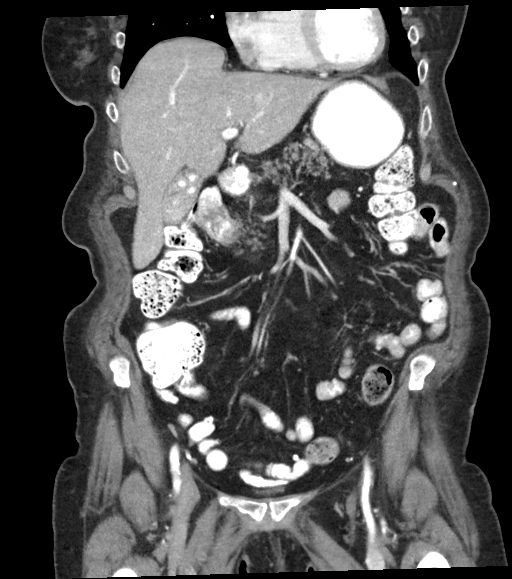
[im 37/84  soft-tissue]
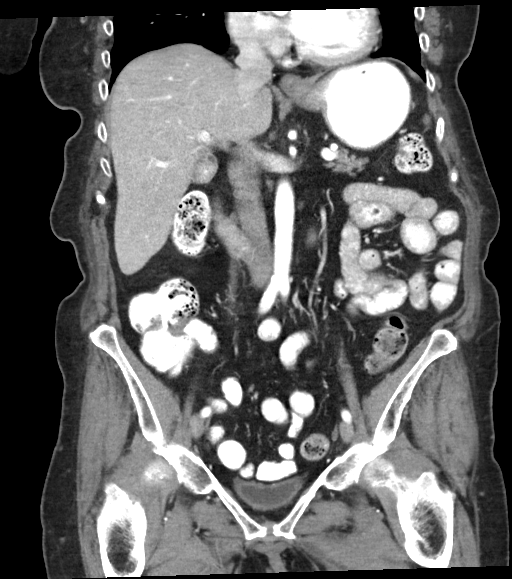
[im 47/84  soft-tissue]
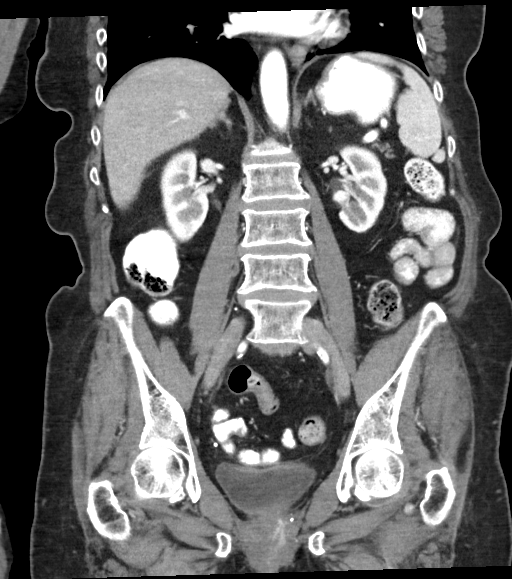

[16 of 46 positions shown; findings below may reference images not displayed]

RADIATION DOSE REDUCTION: This exam was performed according to the
departmental dose-optimization program which includes automated
exposure control, adjustment of the mA and/or kV according to
patient size and/or use of iterative reconstruction technique.

CONTRAST:  100mL OMNIPAQUE IOHEXOL 300 MG/ML  SOLN
FINDINGS: Lower chest: Unremarkable.

Hepatobiliary: No suspicious focal abnormality within the liver
parenchyma. Multiple calcified gallstones evident measuring in the
7-10 mm size range. No gallbladder wall thickening or
pericholecystic fluid. No intrahepatic or extrahepatic biliary
dilation.

Pancreas: No focal mass lesion. No dilatation of the main duct. No
intraparenchymal cyst. No peripancreatic edema.

Spleen: No splenomegaly. No focal mass lesion.

Adrenals/Urinary Tract: No adrenal nodule or mass. Kidneys
unremarkable. No evidence for hydroureter. The urinary bladder
appears normal for the degree of distention.

Stomach/Bowel: Stomach is moderately distended with contrast
material. Duodenum is normally positioned as is the ligament of
Treitz. No small bowel wall thickening. No small bowel dilatation.
The terminal ileum is normal. The appendix is not well visualized,
but there is no edema or inflammation in the region of the cecum. No
gross colonic mass. No colonic wall thickening.

Vascular/Lymphatic: There is mild atherosclerotic calcification of
the abdominal aorta without aneurysm. There is no gastrohepatic or
hepatoduodenal ligament lymphadenopathy. No retroperitoneal or
mesenteric lymphadenopathy. No pelvic sidewall lymphadenopathy.

Reproductive: Uterus surgically absent.  There is no adnexal mass.

Other: No intraperitoneal free fluid.

Musculoskeletal: No worrisome lytic or sclerotic osseous
abnormality.
IMPRESSION: 1. No acute findings in the abdomen or pelvis. Specifically, no
evidence for metastatic disease on today's exam. No findings in the
pelvis to suggest local recurrence.
2. Cholelithiasis.
3. Aortic Atherosclerosis ([id number]).

## 2023-11-18 ENCOUNTER — Encounter: Payer: Self-pay | Admitting: Physical Medicine & Rehabilitation

## 2023-11-20 NOTE — Therapy (Signed)
 [facility] [address] [place], [place], [address] Phone: [phone]   Fax:  [phone]  Patient Details  Name: Ashley Neal MRN: [id number] Date of Birth: 1945-11-25 Referring Provider:  Marzetta Board, MD  Encounter Date: 11/20/2023  SPEECH THERAPY DISCHARGE SUMMARY  Visits from Start of Care: 10  Current functional level related to goals / functional outcomes: Pt goals and impression from last attended session are below. She did not attend another ST session after 04/30/23 and will formally be d/c'd. SHORT TERM GOALS: Target date: 04/24/23   Pt will demo rotary chewing pattern with POs during session x2 Baseline: Goal status: INITIAL   2.  Pt will complete vocal warm up tasks (M-words, ah, glides, counting) with rare min A in 2 sessions Baseline: 04/23/23 Goal status: Partially met   3.  Pt will improve speech volume in 5 minutes simple conversation to average 73 dB in 3 sessions Baseline:  Goal status: Not met   4.  Pt will demonstrate completion of SO home exercises as prescribed 90% compliance in first 2 weeks Baseline:  Goal status: met   5.  Pt will complete effortful swallow with independence in 2 sessions Baseline: 04/23/23 Goal status: Partially met     LONG TERM GOALS: Target date: 1011/24   1.Pt will complete vocal warm up tasks (M-words, ah, glides, counting) with modified independence in 3 sessions Baseline: 04/30/23 Goal status: INITIAL   2.  Pt will improve speech volume in 10 minutes simple-mod complex conversation to low 70s dB in 3 sessions Baseline: 04/30/23 Goal status: INITIAL   3.  In last 1-2 sessions, PROM scores will improve compared to initial administration Baseline:  Goal status: INITIAL   4.  Pt will tell SLP what HEP to perform in order to maintain WNL volume speech over time Baseline: 04/30/23 Goal status: Met   ASSESSMENT:   CLINICAL IMPRESSION: "Ashley Neal" is a  78 y.o. F who was seen today for treatment of speech/voice in light of PD. She has been completing SO lessons x1/day since doing Lesson 24. SLP cont to recommended effortful swallow exercise x20/day until mid-October, then x3/week after that. Ashley Neal told SLP she continues to get 20-25/day in consistently. SLP will cont to monitor swallow function and accuracy of effortful swallow exercise during ST sessions, and MBS may be necessary during this therapy course. With her speech, pt has noted that "The volume gets really low and the ends of my sentences are especially low at times." Pt has agreed to perform SO therapy with SLP for 24 days and a ST therapy course of 17 possible visits.     Remaining deficits: Assumed deficits on last appointment still remain but unknown due to pt not seen in 6 months.   Education / Equipment: See therapy notes   Patient agrees to discharge. Patient goals were partially met. Patient is being discharged due to not returning since the last visit.Marland Kitchen    [facility], CCC-SLP 11/20/2023, 8:31 AM  [facility] [facility] [address] [place], [place], [address] Phone: [phone]   Fax:  [phone]

## 2023-11-24 DIAGNOSIS — G20A1 Parkinson's disease without dyskinesia, without mention of fluctuations: Secondary | ICD-10-CM | POA: Diagnosis not present

## 2023-11-24 DIAGNOSIS — I10 Essential (primary) hypertension: Secondary | ICD-10-CM | POA: Diagnosis not present

## 2023-11-24 DIAGNOSIS — F419 Anxiety disorder, unspecified: Secondary | ICD-10-CM | POA: Diagnosis not present

## 2023-11-24 NOTE — Telephone Encounter (Signed)
 Paper given to me by Gilbert Lab, NP. Paper faxed to number on form [phone]. Paper given to [employer]D to email.

## 2023-11-25 ENCOUNTER — Ambulatory Visit: Payer: Self-pay

## 2023-11-25 NOTE — Patient Outreach (Signed)
 Complex Care Management   Visit Note  11/25/2023  Name:  Ashley Neal MRN: [id number] DOB: 12-24-1945  Situation: Referral received for Complex Care Management related to  Navigation of the Health system  I obtained verbal consent from Patient.  Visit completed with Patient  on the phone  Background:   Past Medical History:  Diagnosis Date   Anxiety    Basal cell carcinoma    Depression    Hypertension    Neuromuscular disorder (HCC)    Osteoarthritis    Osteopenia    Parkinson disease (HCC)    Pneumonia    Tremors of nervous system    legs   uterine ca 10/08/2021   also endometrial cancer    Assessment: Patient Reported Symptoms:  Cognitive Cognitive Status: Able to follow simple commands, Alert and oriented to person, place, and time, Insightful and able to interpret abstract concepts      Neurological   Neurological Comment: due to orthstatic hypotention  HEENT HEENT Symptoms Reported: No symptoms reported HEENT Self-Management Outcome: 3 (uncertain)    Cardiovascular Cardiovascular Symptoms Reported: No symptoms reported Does patient have uncontrolled Hypertension?: No    Respiratory Respiratory Symptoms Reported: No symptoms reported    Endocrine Patient reports the following symptoms related to hypoglycemia or hyperglycemia : No symptoms reported    Gastrointestinal Gastrointestinal Symptoms Reported: No symptoms reported      Genitourinary   Genitourinary Conditions: Incontinence Genitourinary Self-Management Outcome: 3 (uncertain)  Integumentary Integumentary Symptoms Reported: No symptoms reported Skin Self-Management Outcome: 3 (uncertain)  Musculoskeletal Musculoskelatal Symptoms Reviewed: Difficulty walking, Unsteady gait Musculoskeletal Conditions: Back pain, Mobility limited Musculoskeletal Self-Management Outcome: 3 (uncertain) Falls in the past year?: Yes Number of falls in past year: 2 or more Patient at Risk for Falls Due to: History of  fall(s), Medication side effect Fall risk Follow up: Falls evaluation completed, Education provided, Falls prevention discussed  Psychosocial Psychosocial Symptoms Reported: No symptoms reported            11/25/2023    9:09 AM  Depression screen PHQ 2/9  Decreased Interest 0  Down, Depressed, Hopeless 0  PHQ - 2 Score 0    There were no vitals filed for this visit.  Medications Reviewed Today     Reviewed by Augustin Leber, RN (Registered Nurse) on 11/25/23 at [phone]  Med List Status: <None>   Medication Order Taking? Sig Documenting Provider Last Dose Status Informant  carbidopa-levodopa (SINEMET IR) 25-100 MG tablet [id number] Yes Take 1 tablet by mouth 5 (five) times daily. [provider] Taking Active   escitalopram (LEXAPRO) 20 MG tablet [id number] Yes Take 20 mg by mouth daily. [provider] Taking Active             Recommendation:   Follow up [place]  [facility]  Follow Up Plan:   Telephone follow-up in 3 weeks  Augustin Leber RN, BSN, [facility] [place]  [facility], [facility]  Care Coordinator Phone: [phone]

## 2023-11-25 NOTE — Patient Instructions (Signed)
 Visit Information  Thank you for taking time to visit with me today. Please don't hesitate to contact me if I can be of assistance to you before our next scheduled appointment.  Your next care management appointment is by telephone on 12/16/23 at 9 amm  Telephone follow-up in 3 weeks  Please call the care guide team at [phone] if you need to cancel, schedule, or reschedule an appointment.   Please call 1-800-273-TALK (toll free, 24 hour hotline) if you are experiencing a Mental Health or Behavioral Health Crisis or need someone to talk to.  Augustin Leber RN, BSN, [facility]  [facility]  Care Coordinator Phone: [phone]

## 2023-11-27 ENCOUNTER — Encounter: Payer: Self-pay | Admitting: Gynecologic Oncology

## 2023-11-27 ENCOUNTER — Inpatient Hospital Stay: Payer: PPO | Attending: Gynecologic Oncology | Admitting: Gynecologic Oncology

## 2023-11-27 VITALS — BP 118/69 | HR 88 | Temp 97.6°F | Resp 16 | Ht 61.0 in | Wt 156.4 lb

## 2023-11-27 DIAGNOSIS — M549 Dorsalgia, unspecified: Secondary | ICD-10-CM | POA: Diagnosis not present

## 2023-11-27 DIAGNOSIS — K5900 Constipation, unspecified: Secondary | ICD-10-CM | POA: Insufficient documentation

## 2023-11-27 DIAGNOSIS — Z90722 Acquired absence of ovaries, bilateral: Secondary | ICD-10-CM | POA: Diagnosis not present

## 2023-11-27 DIAGNOSIS — C541 Malignant neoplasm of endometrium: Secondary | ICD-10-CM

## 2023-11-27 DIAGNOSIS — Z90710 Acquired absence of both cervix and uterus: Secondary | ICD-10-CM | POA: Diagnosis not present

## 2023-11-27 DIAGNOSIS — Z923 Personal history of irradiation: Secondary | ICD-10-CM | POA: Diagnosis not present

## 2023-11-27 DIAGNOSIS — Z8542 Personal history of malignant neoplasm of other parts of uterus: Secondary | ICD-10-CM | POA: Diagnosis not present

## 2023-11-27 NOTE — Patient Instructions (Signed)
 It was good to see you today.  I do not see or feel any evidence of cancer recurrence on your exam.  We will see you for follow-up in 3 months.  As always, if you develop any new and concerning symptoms before your next visit, please call to see me sooner.

## 2023-11-27 NOTE — Progress Notes (Signed)
 Gynecologic Oncology Return Clinic Visit  11/27/23  Reason for Visit: Surveillance in the setting of endometrial cancer   Treatment History: Oncology History Overview Note  MMR IHC intact MS stable   Endometrial cancer (HCC)  08/31/2021 Imaging   MRI of the pelvis performed for low back pain on 08/31/2021 with incidental finding of endometrial thickening up to 1 cm, suboptimally evaluated.  There is no pelvic free fluid.  No adenopathy.  2.5 cm right ovarian cyst noted.  Other MRI findings: no hip fracture, dislocation or avascular necrosis.  There is mild osteoarthritis of bilateral hips.  There is a large high-grade partial-thickness tear of the left hamstring origin.  Partial-thickness tear of the left gluteus medius tendon insertion.  Mild osteoarthritis of bilateral SI joints also noted.   09/04/2021 Imaging   Pelvic ultrasound exam on 09/04/2021 shows a uterus measuring 6.4 x 3.1 x 4.2 cm with an endometrial lining of 1.1 cm with fluid.  Irregular appearance of the right ovary with simple follicle measuring up to 5 mm, avascular.  Simple cyst adjacent to the right ovary measures up to 2.7 cm and also avascular.  Left ovary noted to be normal.   09/12/2021 Initial Biopsy   EMB: showed rare atypical glands, predominantly mucus with few admixed benign endocervical glands.  Although the rare glandular fragments with cribriform pattern and mild cytologic atypia are extremely scant, they are concerning for at least endometrial hyperplasia.   10/08/2021 Surgery   TRH/BSO, SLN injection with no mapping bilaterally, cystoscopy No residual hyperplasia seen on frozen, no malignancy   Findings:  On EUA, small mobile uterus. Normal upper abdominal survey. Normal appearing omentum and small and large bowel. Normal appendix. Uterus 8cm and normal in appearance with small fundal fibroid. Normal bilateral adnexa with evidence of prior BTL. ICG seen along posterior cervix, mapping not appreciated in either  pelvic LN basin. No obvious adenopathy. Given some difficulty with the uterine manipulator and trouble initially identifying plane between bladder and LUS and cervix, cystoscopy was performed. Bladder dome intact, good efflux noted from bilateral ureteral orifices.    10/08/2021 Pathology Results   A. UTERUS, CERVIX, BILATERAL FALLOPIAN TUBES AND OVARIES:  Invasive well differentiated mucinous endometrial adenocarcinoma, FIGO 1  Tumor invades greater than 50% of the myometrium (11 mm /18 mm)  Tumor arises within complex atypical mucinous hyperplasia involving a  polyp and the anterior and posterior endometrium  Adenomyosis  Background inactive endometrium with cystic change  Benign serous cyst papillary adenofibroma of right ovary  Chronic cervicitis with nabothian cysts  Benign left ovary and right and left fallopian tubes   ONCOLOGY TABLE:   UTERUS, CARCINOMA OR CARCINOSARCOMA: Resection   Procedure: Total hysterectomy and bilateral salpingo-oophorectomy  Histologic Type: Mucinous endometrial adenocarcinoma  Histologic Grade: Well differentiated, FIGO 1  Myometrial Invasion: Present       Depth of Myometrial Invasion (mm): 11 mm       Myometrial Thickness (mm): 18 mm       Percentage of Myometrial Invasion: 61%  Uterine Serosa Involvement: Not identified  Cervical stromal Involvement:[Not identified  Extent of involvement of other tissue/organs: Not identified  Peritoneal/Ascitic Fluid: Not submitted  Lymphovascular Invasion: Not identified  Regional Lymph Nodes: Not applicable (no lymph nodes submitted or found)   Distant Metastasis: Not applicable  Pathologic Stage Classification (pTNM, AJCC 8th Edition): pT1b, pN n/a  Ancillary Studies: MMR / MSI testing will be ordered  Representative Tumor Block: A5, A6  Comment(s): None    10/16/2021  Initial Diagnosis   Endometrial cancer (HCC)   11/06/2021 Surgery   Robotic-assisted bilateral pelvic lymph node dissection, right  para-aortic lymph node dissection, cystoscopy  Findings: On EUA, well-healed vaginal cuff.  Normal upper abdominal survey.  Normal-appearing omentum, small and large bowel.  Some filmy adhesions of the sigmoid epiploica to the left pelvic sidewall.  Significant fibrosis noted of bilateral pelvic sidewalls and the retroperitoneal spaces.  Nodular tissue along some areas recent surgical dissection, presumed to be inflammatory from surgery.  No obvious adenopathy.  Fibrosis made pelvic lymph node dissection very challenging with significant adherence of minimal lymphatic tissue to the right external iliac vein.  Anatomy quite challenging to delineate in the setting of this fibrosis as well as recent surgery.  Along the para-aortic lymph node beds, no significant tissue noted along the left aspect and due to retroperitoneal fibrosis, further dissection did not seem safe.  Along the right, what was presumed to be the IVC was displaced laterally.  Cystoscopy, bladder dome noted to be intact, good efflux noted from bilateral ureteral orifices.   11/06/2021 Pathology Results   A. LYMPH NODE, LEFT PELVIC, EXCISION:  - Four lymph nodes, negative for carcinoma (0/4)   B. PERITONEUM, BIOPSY:  - Peritonealized soft tissue with focal fat necrosis  - Negative for carcinoma   C. LYMPH NODE, RIGHT PELVIC, EXCISION:  - Five lymph nodes, negative for carcinoma (0/5)   D. ROUND LIGAMENT REMNANT, EXCISION:  - Negative for carcinoma   E. PARA-AORTIC, RIGHT, EXCISION:  - Lymph node, negative for carcinoma (0   12/18/2021 - 01/15/2022 Radiation Therapy   Vaginal brachytherapy - 30 Gy     Interval History: Patient continues to struggle with back pain, pelvic pain similar to prior to surgery.  Denies any vaginal bleeding or discharge.  Unchanged constipation, has a bowel movement every other day.  Past Medical/Surgical History: Past Medical History:  Diagnosis Date   Anxiety    Basal cell carcinoma    Depression     Hypertension    Neuromuscular disorder (HCC)    Osteoarthritis    Osteopenia    Parkinson disease (HCC)    Pneumonia    Tremors of nervous system    legs   uterine ca 10/08/2021   also endometrial cancer    Past Surgical History:  Procedure Laterality Date   CARPAL TUNNEL RELEASE Bilateral 1996/1997   CYSTOSCOPY N/A 10/08/2021   Procedure: CYSTOSCOPY;  Surgeon: Suzi Essex, MD;  Location: [facility];  Service: Gynecology;  Laterality: N/A;   ROBOTIC ASSISTED TOTAL HYSTERECTOMY WITH BILATERAL SALPINGO OOPHERECTOMY N/A 10/08/2021   Procedure: XI ROBOTIC ASSISTED TOTAL HYSTERECTOMY WITH BILATERAL SALPINGO OOPHORECTOMY;SENTINEL LYMPH NODE INJECTION;  Surgeon: Suzi Essex, MD;  Location: [facility];  Service: Gynecology;  Laterality: N/A;   SQUAMOUS CELL CARCINOMA EXCISION Left 2017   Left jawline   TUBAL LIGATION  1980's    Family History  Problem Relation Age of Onset   Endometrial cancer Mother    Diabetes Mother    Alzheimer's disease Father    Coronary artery disease Father    Diabetes Sister    COPD Sister    Diabetes Sister    Heart disease Brother    Other Brother        MVA    Parkinson's disease Brother    Heart disease Brother    Alcohol abuse Brother    Heart disease Brother    Parkinson's disease Maternal Aunt    Breast cancer Maternal Aunt  Colon cancer Paternal Aunt    Parkinson's disease Paternal Aunt    Lung cancer Paternal Uncle    Healthy Son    Healthy Son    Prostate cancer Neg Hx    Pancreatic cancer Neg Hx    Ovarian cancer Neg Hx     Social History   Socioeconomic History   Marital status: Married    Spouse name: Aura Leeds   Number of children: 2   Years of education: 16   Highest education level: Bachelor's degree (e.g., BA, AB, BS)  Occupational History   Occupation: retired    Comment: Advertising account planner  Tobacco Use   Smoking status: Never    Passive exposure: Never   Smokeless tobacco: Never  Vaping Use    Vaping status: Never Used  Substance and Sexual Activity   Alcohol use: Not Currently    Comment: rarely   Drug use: Not Currently   Sexual activity: Not Currently    Partners: Male  Other Topics Concern   Not on file  Social History Narrative   Not on file   Social Drivers of Health   Financial Resource Strain: Low Risk  (10/16/2023)   Overall Financial Resource Strain (CARDIA)    Difficulty of Paying Living Expenses: Not very hard  Food Insecurity: No Food Insecurity (10/16/2023)   Hunger Vital Sign    Worried About Running Out of Food in the Last Year: Never true    Ran Out of Food in the Last Year: Never true  Transportation Needs: Unmet Transportation Needs (10/16/2023)   PRAPARE - Administrator, Civil Service (Medical): Yes    Lack of Transportation (Non-Medical): No  Physical Activity: Sufficiently Active (08/23/2023)   Exercise Vital Sign    Days of Exercise per Week: 5 days    Minutes of Exercise per Session: 60 min  Stress: No Stress Concern Present (08/23/2023)   [employer] of Occupational Health - Occupational Stress Questionnaire    Feeling of Stress : Only a little  Social Connections: Moderately Integrated (08/23/2023)   Social Connection and Isolation Panel [NHANES]    Frequency of Communication with Friends and Family: More than three times a week    Frequency of Social Gatherings with Friends and Family: Twice a week    Attends Religious Services: Never    Database administrator or Organizations: Yes    Attends Engineer, structural: More than 4 times per year    Marital Status: Married    Current Medications:  Current Outpatient Medications:    carbidopa -levodopa  (SINEMET  IR) 25-100 MG tablet, Take 1 tablet by mouth 5 (five) times daily., Disp: , Rfl:    escitalopram (LEXAPRO) 20 MG tablet, Take 20 mg by mouth daily., Disp: , Rfl:    Multiple Vitamin (MULTI-VITAMIN) tablet, Take 1 tablet by mouth daily., Disp: , Rfl:   Review of  Systems: + Fatigue, hearing loss, ringing in ears, voice changes, abdominal distention, abdominal pain, constipation, frequency, incontinence, pelvic pain, back pain, muscle pain/cramps, dizziness, problems with walking, headache, anxiety, decreased concentration, easy bruising/bleeding. Denies appetite changes, fevers, chills, unexplained weight changes. Denies neck lumps or masses, mouth sores. Denies cough or wheezing.  Denies shortness of breath. Denies chest pain or palpitations. Denies leg swelling. Denies blood in stools, diarrhea, nausea, vomiting, or early satiety. Denies pain with intercourse, dysuria, hematuria. Denies hot flashes, vaginal bleeding or vaginal discharge.   Denies joint pain. Denies itching, rash, or wounds. Denies numbness or seizures. Denies  swollen lymph nodes or glands.  Physical Exam: BP 118/69 (BP Location: Left Arm, Patient Position: Sitting)   Pulse 88   Temp 97.6 F (36.4 C) (Tympanic)   Resp 16   Ht 5\' 1"  (1.549 m)   Wt 156 lb 6.4 oz (70.9 kg)   SpO2 97%   BMI 29.55 kg/m  General: Alert, oriented, no acute distress. HEENT: Normocephalic, atraumatic, sclera anicteric. Chest: Clear to auscultation bilaterally.  Wheezes or rhonchi. Cardiovascular: Regular rate and rhythm, no murmurs. Abdomen: soft, nontender.  Normoactive bowel sounds.  No masses or hepatosplenomegaly appreciated.  Well-healed incisions. Extremities: Grossly normal range of motion.  Warm, well perfused.  No edema bilaterally. Skin: No rashes or lesions noted. Lymphatics: No cervical, supraclavicular, or inguinal adenopathy. GU: Normal appearing external genitalia without erythema, excoriation, or lesions.  Speculum exam reveals moderately atrophic vaginal mucosa, some radiation changes noted at the vaginal apex.  Bimanual exam reveals no nodularity or masses.  Rectovaginal exam confirms findings.  Laboratory & Radiologic Studies: None new  Assessment & Plan: Ashley Neal is  a 78 y.o. woman with Stage IB grade 1 mucinous endometrial adenocarcinoma who presents for follow-up after completion of adjuvant vaginal brachytherapy in 01/2022. MSS, MMRp.   Patient is doing well from a cancer standpoint; she is NED on exam today. She continues to struggle with significant back pain, worsening of her Parkinson's.  Support given.   We will continued with NCCN surveillance recommendations.  In the setting of high-intermediate risk, after completion of adjuvant treatment, recommendation remains for surveillance visits every 3 months for 2-3 years. She will see Melissa in 3 months for follow-up.  We will transition to visits every 6 months at the end of this year.  She is advised to call the office sooner for any needs, new symptoms. We discussed signs and symptoms that would be concerning for cancer recurrence and I stressed the importance of calling if she develops any of these.  22 minutes of total time was spent for this patient encounter, including preparation, face-to-face counseling with the patient and coordination of care, and documentation of the encounter.  Wiley Hanger, MD  Division of Gynecologic Oncology  Department of Obstetrics and Gynecology  [facility]

## 2023-12-01 ENCOUNTER — Telehealth: Payer: Self-pay | Admitting: *Deleted

## 2023-12-01 DIAGNOSIS — G20A1 Parkinson's disease without dyskinesia, without mention of fluctuations: Secondary | ICD-10-CM

## 2023-12-01 NOTE — Progress Notes (Unsigned)
 Complex Care Management Care Guide Note  12/01/2023 Name: DEVONNE KITCHEN MRN: [id number] DOB: 01-10-1946  Arnetta Lank is a 78 y.o. year old female who is a primary care patient of Eubanks, Jessica K, NP and is actively engaged with the care management team. I reached out to Arnetta Lank by phone today to assist with re-scheduling  with the RN Case Manager.  Follow up plan: Unsuccessful telephone outreach attempt made. A HIPAA compliant phone message was left for the patient providing contact information and requesting a return call.  Barnie Bora  [facility]  [facility], [facility] Guide  Direct Dial: [phone]  Fax [phone]

## 2023-12-03 DIAGNOSIS — G20A1 Parkinson's disease without dyskinesia, without mention of fluctuations: Secondary | ICD-10-CM | POA: Diagnosis not present

## 2023-12-03 DIAGNOSIS — M5440 Lumbago with sciatica, unspecified side: Secondary | ICD-10-CM | POA: Diagnosis not present

## 2023-12-03 NOTE — Progress Notes (Signed)
 Complex Care Management Care Guide Note  12/03/2023 Name: Ashley Neal MRN: [id number] DOB: 1946/08/11  Ashley Neal is a 78 y.o. year old female who is a primary care patient of Eubanks, Jessica K, NP and is actively engaged with the care management team. I reached out to Ashley Neal by phone today to assist with re-scheduling  with the RN Case Manager.  Follow up plan: Telephone appointment with complex care management team member scheduled for:  4/29 with RNCM and 5/1 with LCSW  Barnie Bora  [facility], [facility] Guide  Direct Dial: [phone]  Fax [phone]

## 2023-12-08 ENCOUNTER — Other Ambulatory Visit: Payer: Self-pay

## 2023-12-08 NOTE — Patient Outreach (Signed)
 Complex Care Management   Visit Note  12/08/2023  Name:  Ashley Neal MRN: [id number] DOB: 07/16/1946  Situation: Referral received for Complex Care Management related to  parkinsonism  I obtained verbal consent from Patient.  Visit completed with Magnolia Schroeder  on the phone  Background:   Past Medical History:  Diagnosis Date   Anxiety    Basal cell carcinoma    Depression    Hypertension    Neuromuscular disorder (HCC)    Osteoarthritis    Osteopenia    Parkinson disease (HCC)    Pneumonia    Tremors of nervous system    legs   uterine ca 10/08/2021   also endometrial cancer    Assessment: Patient Reported Symptoms:  Cognitive Cognitive Status: Able to follow simple commands, Alert and oriented to person, place, and time, Normal speech and language skills (Difficulty remembering names) Cognitive/Intellectual Conditions Management [RPT]: None reported or documented in medical history or problem list      Neurological Neurological Review of Symptoms: Dizziness (due to orthostatic hypotension) Neurological Conditions:  (Parkinsonism) Neurological Management Strategies: Adequate rest, Medication therapy (Avoiding certain positions) Neurological Comment: Patient reports she would like to go back to previously neurologist for ongoing management of parkinsonism. She is unsure if this practice will take her back. Advised that she contact practice and ask.  HEENT HEENT Symptoms Reported: No symptoms reported      Cardiovascular Cardiovascular Symptoms Reported: Dizziness (due to orthostatic hypotension per patient) Does patient have uncontrolled Hypertension?: No Cardiovascular Conditions: Hypertension, High blood cholesterol Cardiovascular Management Strategies: Adequate rest  Respiratory Respiratory Symptoms Reported: No symptoms reported    Endocrine Patient reports the following symptoms related to hypoglycemia or hyperglycemia : No symptoms reported Is patient  diabetic?: No    Gastrointestinal Gastrointestinal Symptoms Reported: No symptoms reported (Last BM today) Additional Gastrointestinal Details: Patient reports a good appetite. Eats 3 meals a day.   Nutrition Risk Screen (CP): No indicators present  Genitourinary Genitourinary Symptoms Reported: No symptoms reported Genitourinary Comment: patient denies episodes of incontinence  Integumentary Integumentary Symptoms Reported: No symptoms reported    Musculoskeletal Musculoskelatal Symptoms Reviewed: Difficulty walking, Unsteady gait, Other Other Musculoskeletal Symptoms: Tremors in R hand, occasional jerking in R leg Musculoskeletal Conditions: Back pain, Mobility limited Musculoskeletal Management Strategies: Adequate rest Falls in the past year?: Yes Number of falls in past year: 2 or more Was there an injury with Fall?: No Fall Risk Category Calculator: 2 Patient Fall Risk Level: Moderate Fall Risk Patient at Risk for Falls Due to: History of fall(s), Impaired balance/gait, Impaired mobility, Medication side effect Fall risk Follow up: Falls evaluation completed, Education provided, Falls prevention discussed  Psychosocial Psychosocial Symptoms Reported: Anxiety - if selected complete GAD Behavioral Health Conditions:  (None per chart review) Major Change/Loss/Stressor/Fears (CP): Medical condition, self Quality of Family Relationships: helpful, involved, supportive Do you feel physically threatened by others?: No      12/08/2023    2:41 PM  Depression screen PHQ 2/9  Decreased Interest 0  Down, Depressed, Hopeless 0  PHQ - 2 Score 0    Vitals:   12/08/23 1422  BP: 114/62  Pulse: 82    Medications Reviewed Today     Reviewed by Valaria Garland, RN (Registered Nurse) on 12/08/23 at 1421  Med List Status: <None>   Medication Order Taking? Sig Documenting Provider Last Dose Status Informant  carbidopa -levodopa  (SINEMET  IR) 25-100 MG tablet [id number]  Take 1 tablet by  mouth 5 (five) times  daily. [provider]  Active   escitalopram (LEXAPRO) 20 MG tablet [id number]  Take 20 mg by mouth daily. [provider]  Active   Multiple Vitamin (MULTI-VITAMIN) tablet [id number]  Take 1 tablet by mouth daily. [provider]  Active             Recommendation:   Specialty provider follow-up pain management 12/23/23 Contact your previous neurologist to ask if they will accept you back to their practice  Follow Up Plan:   Telephone follow up appointment date/time:  01/08/24 at 1 PM  Theodora Fish, RN MSN [place]  [facility] RN Care Manager Direct Dial: [phone]  Fax: [phone]

## 2023-12-08 NOTE — Patient Instructions (Signed)
 Visit Information  Thank you for taking time to visit with me today. Please don't hesitate to contact me if I can be of assistance to you before our next scheduled appointment.  Your next care management appointment is by telephone on 01/08/24 at 1 PM  Please call the care guide team at [phone] if you need to cancel, schedule, or reschedule an appointment.   Please call the Suicide and Crisis Lifeline: 988 call 1-800-273-TALK (toll free, 24 hour hotline) if you are experiencing a Mental Health or Behavioral Health Crisis or need someone to talk to.  Theodora Fish, RN MSN [place]  [facility] Population Health RN Care Manager Direct Dial: [phone]  Fax: [phone]  Fall Prevention in the Home, Adult Falls can cause injuries and can happen to people of all ages. There are many things you can do to make your home safer and to help prevent falls. What actions can I take to prevent falls? General information Use good lighting in all rooms. Make sure to: Replace any light bulbs that burn out. Turn on the lights in dark areas and use night-lights. Keep items that you use often in easy-to-reach places. Lower the shelves around your home if needed. Move furniture so that there are clear paths around it. Do not use throw rugs or other things on the floor that can make you trip. If any of your floors are uneven, fix them. Add color or contrast paint or tape to clearly mark and help you see: Grab bars or handrails. First and last steps of staircases. Where the edge of each step is. If you use a ladder or stepladder: Make sure that it is fully opened. Do not climb a closed ladder. Make sure the sides of the ladder are locked in place. Have someone hold the ladder while you use it. Know where your pets are as you move through your home. What can I do in the bathroom?     Keep the floor dry. Clean up any water  on the floor right away. Remove soap buildup in the bathtub or  shower. Buildup makes bathtubs and showers slippery. Use non-skid mats or decals on the floor of the bathtub or shower. Attach bath mats securely with double-sided, non-slip rug tape. If you need to sit down in the shower, use a non-slip stool. Install grab bars by the toilet and in the bathtub and shower. Do not use towel bars as grab bars. What can I do in the bedroom? Make sure that you have a light by your bed that is easy to reach. Do not use any sheets or blankets on your bed that hang to the floor. Have a firm chair or bench with side arms that you can use for support when you get dressed. What can I do in the kitchen? Clean up any spills right away. If you need to reach something above you, use a step stool with a grab bar. Keep electrical cords out of the way. Do not use floor polish or wax that makes floors slippery. What can I do with my stairs? Do not leave anything on the stairs. Make sure that you have a light switch at the top and the bottom of the stairs. Make sure that there are handrails on both sides of the stairs. Fix handrails that are broken or loose. Install non-slip stair treads on all your stairs if they do not have carpet. Avoid having throw rugs at the top or bottom of the stairs. Choose a  carpet that does not hide the edge of the steps on the stairs. Make sure that the carpet is firmly attached to the stairs. Fix carpet that is loose or worn. What can I do on the outside of my home? Use bright outdoor lighting. Fix the edges of walkways and driveways and fix any cracks. Clear paths of anything that can make you trip, such as tools or rocks. Add color or contrast paint or tape to clearly mark and help you see anything that might make you trip as you walk through a door, such as a raised step or threshold. Trim any bushes or trees on paths to your home. Check to see if handrails are loose or broken and that both sides of all steps have handrails. Install guardrails  along the edges of any raised decks and porches. Have leaves, snow, or ice cleared regularly. Use sand, salt, or ice melter on paths if you live where there is ice and snow during the winter. Clean up any spills in your garage right away. This includes grease or oil spills. What other actions can I take? Review your medicines with your doctor. Some medicines can cause dizziness or changes in blood pressure, which increase your risk of falling. Wear shoes that: Have a low heel. Do not wear high heels. Have rubber bottoms and are closed at the toe. Feel good on your feet and fit well. Use tools that help you move around if needed. These include: Canes. Walkers. Scooters. Crutches. Ask your doctor what else you can do to help prevent falls. This may include seeing a physical therapist to learn to do exercises to move better and get stronger. Where to find more information Centers for Disease Control and Prevention, STEAD[employer]: TonerPromos.no [employer] on Aging: BaseRingTones.pl National Institute on Aging: BaseRingTones.pl Contact a doctor if: You are afraid of falling at home. You feel weak, drowsy, or dizzy at home. You fall at home. Get help right away if you: Lose consciousness or have trouble moving after a fall. Have a fall that causes a head injury. These symptoms may be an emergency. Get help right away. Call 911. Do not wait to see if the symptoms will go away. Do not drive yourself to the hospital. This information is not intended to replace advice given to you by your health care provider. Make sure you discuss any questions you have with your health care provider. Document Revised: 03/31/2022 Document Reviewed: 03/31/2022 Elsevier Patient Education  2024 [employer].

## 2023-12-10 ENCOUNTER — Other Ambulatory Visit: Payer: Self-pay | Admitting: Licensed Clinical Social Worker

## 2023-12-10 NOTE — Patient Outreach (Signed)
 Complex Care Management   Visit Note  12/10/2023  Name:  Ashley Neal MRN: [id number] DOB: [birth year]  Situation: Referral received for Complex Care Management related to [facility]/Behavioral Health diagnosis anxiety  I obtained verbal consent from Patient.  Visit completed with L Reidel   on the phone  Background:   Past Medical History:  Diagnosis Date   Anxiety    Basal cell carcinoma    Depression    Hypertension    Neuromuscular disorder (HCC)    Osteoarthritis    Osteopenia    Parkinson disease (HCC)    Pneumonia    Tremors of nervous system    legs   uterine ca 10/08/2021   also endometrial cancer    Assessment: Patient Reported Symptoms:  Cognitive        Neurological      HEENT        Cardiovascular      Respiratory      Endocrine      Gastrointestinal        Genitourinary      Integumentary      Musculoskeletal          Psychosocial              12/08/2023    2:41 PM  Depression screen PHQ 2/9  Decreased Interest 0  Down, Depressed, Hopeless 0  PHQ - 2 Score 0    There were no vitals filed for this visit.  Medications Reviewed Today   Medications were not reviewed in this encounter     Recommendation:   No recommendations. Pt reports she is not interested or in need to social work assistance.   Follow Up Plan:   Closing From:  Complex Care Management  Fletcher Humble MSW, LCSW Licensed Clinical Social Worker  [facility], Population Health Direct Dial: [phone]  Fax: [phone]

## 2023-12-10 NOTE — Patient Instructions (Signed)
 Visit Information  Thank you for taking time to visit with me today. Please don't hesitate to contact me if I can be of assistance to you before our next scheduled appointment.   Please call the care guide team at [phone] if you need to cancel, schedule, or reschedule an appointment.   Please call 911 if you are experiencing a Mental Health or Behavioral Health Crisis or need someone to talk to.  Gwyndolyn Saxon MSW, LCSW Licensed Clinical Social Worker  [facility], Population Health Direct Dial: [phone]  Fax: [phone]

## 2023-12-16 ENCOUNTER — Telehealth

## 2023-12-22 NOTE — Progress Notes (Unsigned)
 Subjective:    Patient ID: Ashley Neal, female    DOB: Jan 28, 1946, 78 y.o.   MRN: [id number]  HPI  This is an initial office visit for Ashley Neal is a 78 yo female with Parkinson's Disease (2018) who is here today regarding low back pain which started in June of 2022 when she bent over to use a shoe horn. The pain was in her left hip. She received numerous injections into the left hip and low back over the last few years. None of the injections helped. An MRI was eventually done in January of 2023 of her lumbar spine and pelvis which revealed the following.    L3-4: Mild disc bulging is asymmetric to the left. Mild facet hypertrophy is noted bilaterally. The central canal is patent. Mild left foraminal narrowing is present.   L4-5: A mild broad-based disc protrusion extends into the foramina bilaterally. Mild bilateral facet hypertrophy is noted. Mild foraminal narrowing is present bilaterally.   L5-S1: Minimal disc bulging is present. Mild left foraminal narrowing is noted.   1. No acute osseous injury of the pelvis. 2. Mild osteoarthritis of bilateral SI joints. 3. Partial-thickness tear of the gluteus medius tendon bilaterally. 4. Small partial-thickness tear of the left hamstring origin. 5. Broad-based disc bulge at L4-5 with bilateral mild facet arthropathy. Moderate bilateral facet arthropathy at L5-S1.  Her pain can bother her in any position. She takes an occasional tylenol  for pain relief. She's been medication including hydrocodone  and lyrica  in the past. She did not tolerate these  .  With her PD she has a tremor, problems with orthostatic hypotension, posture.  She works out daily, participates in exercise classes when she can.       Pain Inventory Average Pain 10 Pain Right Now 7 My pain is constant, sharp, burning, stabbing, and aching  In the last 24 hours, has pain interfered with the following? General activity 5 Relation with others  8 Enjoyment of life 9 What TIME of day is your pain at its worst? morning , daytime, and evening Sleep (in general) Good  Pain is worse with: walking, standing, and some activites Pain improves with: heat/ice Relief from Meds: 2  walk without assistance how many minutes can you walk? 1-3 ability to climb steps?  yes do you drive?  yes  retired I need assistance with the following:  shopping Do you have any goals in this area?  yes  bladder control problems weakness tremor tingling dizziness anxiety  Any changes since last visit?  no  Any changes since last visit?  no    Family History  Problem Relation Age of Onset   Endometrial cancer Mother    Diabetes Mother    Alzheimer's disease Father    Coronary artery disease Father    Diabetes Sister    COPD Sister    Diabetes Sister    Heart disease Brother    Other Brother        MVA    Parkinson's disease Brother    Heart disease Brother    Alcohol abuse Brother    Heart disease Brother    Parkinson's disease Maternal Aunt    Breast cancer Maternal Aunt    Colon cancer Paternal Aunt    Parkinson's disease Paternal Aunt    Lung cancer Paternal Uncle    Healthy Son    Healthy Son    Prostate cancer Neg Hx    Pancreatic cancer Neg Hx    Ovarian cancer  Neg Hx    Social History   Socioeconomic History   Marital status: Married    Spouse name: Aura Leeds   Number of children: 2   Years of education: 16   Highest education level: Bachelor's degree (e.g., BA, AB, BS)  Occupational History   Occupation: retired    Comment: Advertising account planner  Tobacco Use   Smoking status: Never    Passive exposure: Never   Smokeless tobacco: Never  Vaping Use   Vaping status: Never Used  Substance and Sexual Activity   Alcohol use: Not Currently    Comment: rarely   Drug use: Not Currently   Sexual activity: Not Currently    Partners: Male  Other Topics Concern   Not on file  Social History Narrative   Not on  file   Social Drivers of Health   Financial Resource Strain: Low Risk  (10/16/2023)   Overall Financial Resource Strain (CARDIA)    Difficulty of Paying Living Expenses: Not very hard  Food Insecurity: No Food Insecurity (12/08/2023)   Hunger Vital Sign    Worried About Running Out of Food in the Last Year: Never true    Ran Out of Food in the Last Year: Never true  Transportation Needs: Unmet Transportation Needs (12/08/2023)   PRAPARE - Administrator, Civil Service (Medical): Yes    Lack of Transportation (Non-Medical): No  Physical Activity: Sufficiently Active (08/23/2023)   Exercise Vital Sign    Days of Exercise per Week: 5 days    Minutes of Exercise per Session: 60 min  Stress: No Stress Concern Present (08/23/2023)   [employer] of Occupational Health - Occupational Stress Questionnaire    Feeling of Stress : Only a little  Social Connections: Moderately Integrated (08/23/2023)   Social Connection and Isolation Panel [NHANES]    Frequency of Communication with Friends and Family: More than three times a week    Frequency of Social Gatherings with Friends and Family: Twice a week    Attends Religious Services: Never    Database administrator or Organizations: Yes    Attends Engineer, structural: More than 4 times per year    Marital Status: Married   Past Surgical History:  Procedure Laterality Date   CARPAL TUNNEL RELEASE Bilateral 1996/1997   CYSTOSCOPY N/A 10/08/2021   Procedure: CYSTOSCOPY;  Surgeon: Suzi Essex, MD;  Location: [facility];  Service: Gynecology;  Laterality: N/A;   ROBOTIC ASSISTED TOTAL HYSTERECTOMY WITH BILATERAL SALPINGO OOPHERECTOMY N/A 10/08/2021   Procedure: XI ROBOTIC ASSISTED TOTAL HYSTERECTOMY WITH BILATERAL SALPINGO OOPHORECTOMY;SENTINEL LYMPH NODE INJECTION;  Surgeon: Suzi Essex, MD;  Location: [facility];  Service: Gynecology;  Laterality: N/A;   SQUAMOUS CELL CARCINOMA EXCISION Left 2017   Left jawline   TUBAL  LIGATION  1980's   Past Medical History:  Diagnosis Date   Anxiety    Basal cell carcinoma    Depression    Hypertension    Neuromuscular disorder (HCC)    Osteoarthritis    Osteopenia    Parkinson disease (HCC)    Pneumonia    Tremors of nervous system    legs   uterine ca 10/08/2021   also endometrial cancer   There were no vitals taken for this visit.  Opioid Risk Score:   Fall Risk Score:  `1  Depression screen [facility] 2/9     12/08/2023    2:41 PM 11/25/2023    9:09 AM 07/01/2023   10:19 AM  05/08/2023   11:06 AM 05/04/2023   10:03 AM 11/14/2022    1:45 PM  Depression screen PHQ 2/9  Decreased Interest 0 0 2 2 1  0  Down, Depressed, Hopeless 0 0 1 2 1  0  PHQ - 2 Score 0 0 3 4 2  0  Altered sleeping   0 0 0   Tired, decreased energy   2 1 1    Change in appetite   1 0 0   Feeling bad or failure about yourself    0 0 0   Trouble concentrating   0 0 0   Moving slowly or fidgety/restless   0 2 0   Suicidal thoughts   0 0 0   PHQ-9 Score   6 7 3    Difficult doing work/chores   Somewhat difficult Somewhat difficult      Review of Systems  Musculoskeletal:  Positive for gait problem.  Neurological:  Positive for dizziness, tremors and weakness.       Tingling  Psychiatric/Behavioral:         Anxiety  All other systems reviewed and are negative.      Objective:   Physical Exam   General: Alert and oriented x 3, No apparent distress HEENT: Head is normocephalic, atraumatic, PERRLA, EOMI, sclera anicteric, oral mucosa pink and moist, dentition intact, ext ear canals clear,  Neck: Supple without JVD or lymphadenopathy Heart: Reg rate and rhythm. No murmurs rubs or gallops Chest: CTA bilaterally without wheezes, rales, or rhonchi; no distress Abdomen: Soft, non-tender, non-distended, bowel sounds positive. Extremities: No clubbing, cyanosis, or edema. Pulses are 2+ Psych: Pt's affect is appropriate. Pt is cooperative Skin: Clean and intact without signs of  breakdown Neuro:  Alert and oriented x 3. Normal insight and awareness. Intact Memory. Normal language and speech. Cranial nerve exam unremarkable. MMT: 4/5 UE and LE's. Pill rolling tremor R>L UE, masked facies. Has difficulty transitioning from sit-std. Shuffling, festinating gait. Sensory exam normal for light touch and pain in all 4 limbs. No limb ataxia or cerebellar signs. No abnormal tone appreciated.  .   Musculoskeletal: low back tender along coccyx and left coccygeal-sacral area. Lumbar parasinals, obliques and glutes are painful near belt line on left with associated spasm. Lumbar flexion/extension/rotation did not increase pain. SLR negative.         Assessment & Plan:  Chronic low back pain Coccydynia, left sided. She's had a lot done to work this up over the years. We'll see what else we can add to assess and treat.  Parkinson's disease  Plan: Made referral to outpt physical therapy to address modailites, stretching, strengthening exercises for low back and pelvis. Trial of TENS, deep heating. HEP Trial of lidocaine  patches to pelvis Shortwave diathermy  Records from [facility] as to what procedures she's had.   Forty-five minutes of face to face patient care time were spent during this visit. All questions were encouraged and answered. Follow up with me in 2 mos.

## 2023-12-23 ENCOUNTER — Encounter: Payer: Self-pay | Admitting: Physical Medicine & Rehabilitation

## 2023-12-23 ENCOUNTER — Encounter: Attending: Physical Medicine & Rehabilitation | Admitting: Physical Medicine & Rehabilitation

## 2023-12-23 VITALS — BP 119/87 | HR 92 | Wt 155.0 lb

## 2023-12-23 DIAGNOSIS — M47816 Spondylosis without myelopathy or radiculopathy, lumbar region: Secondary | ICD-10-CM | POA: Insufficient documentation

## 2023-12-23 DIAGNOSIS — M533 Sacrococcygeal disorders, not elsewhere classified: Secondary | ICD-10-CM | POA: Insufficient documentation

## 2023-12-23 MED ORDER — LIDOCAINE 5 % EX PTCH: 2.0000 | MEDICATED_PATCH | CUTANEOUS | 4 refills | Status: DC

## 2023-12-23 NOTE — Patient Instructions (Signed)
 ALWAYS FEEL FREE TO CALL OUR OFFICE WITH ANY PROBLEMS OR QUESTIONS [phone])  **PLEASE NOTE** ALL MEDICATION REFILL REQUESTS (INCLUDING CONTROLLED SUBSTANCES) NEED TO BE MADE AT LEAST 7 DAYS PRIOR TO REFILL BEING DUE. ANY REFILL REQUESTS INSIDE THAT TIME FRAME MAY RESULT IN DELAYS IN RECEIVING YOUR PRESCRIPTION.

## 2023-12-28 ENCOUNTER — Ambulatory Visit: Payer: PPO | Admitting: Family

## 2023-12-28 ENCOUNTER — Encounter: Payer: Self-pay | Admitting: Nurse Practitioner

## 2023-12-28 ENCOUNTER — Ambulatory Visit: Payer: PPO | Admitting: Nurse Practitioner

## 2023-12-28 ENCOUNTER — Telehealth: Payer: Self-pay | Admitting: *Deleted

## 2023-12-28 VITALS — BP 126/84 | HR 88 | Temp 97.2°F | Ht 61.0 in | Wt 156.0 lb

## 2023-12-28 DIAGNOSIS — I951 Orthostatic hypotension: Secondary | ICD-10-CM | POA: Diagnosis not present

## 2023-12-28 DIAGNOSIS — F3341 Major depressive disorder, recurrent, in partial remission: Secondary | ICD-10-CM

## 2023-12-28 DIAGNOSIS — R739 Hyperglycemia, unspecified: Secondary | ICD-10-CM

## 2023-12-28 DIAGNOSIS — G20A1 Parkinson's disease without dyskinesia, without mention of fluctuations: Secondary | ICD-10-CM

## 2023-12-28 DIAGNOSIS — Z1159 Encounter for screening for other viral diseases: Secondary | ICD-10-CM

## 2023-12-28 DIAGNOSIS — M5416 Radiculopathy, lumbar region: Secondary | ICD-10-CM | POA: Diagnosis not present

## 2023-12-28 NOTE — Telephone Encounter (Signed)
 Prior auth for Lidocaine  patches 5% submitted to insurance via CoverMyMeds. Arnetta Lank (Key: [place]) PA Case ID #: [id number] Rx #: [id number]

## 2023-12-28 NOTE — Progress Notes (Signed)
 Careteam: Patient Care Team: Verma Gobble, NP as PCP - General (Geriatric Medicine) Lucendia Rusk, MD as PCP - Cardiology (Cardiology) Valaria Garland, RN as Registered Nurse  PLACE OF SERVICE:  [facility]  Advanced Directive information    Allergies  Allergen Reactions   Hydrocodone -Acetaminophen      Dizzy, ringing in ears  Ineffective for pain  Dizzy, ringing in ears Ineffective for pain   Lyrica  [Pregabalin ]     Dizziness and ringing in ears    Codeine Other (See Comments)    Nausea     Chief Complaint  Patient presents with   Follow-up    4 month follow up , still having lower back , she has follow u with pain doctor appt is July and she was also told started PT. She is doing at home exercise that she was given, she was doing yoga had to stop because he b/p.    HPI:  Discussed the use of AI scribe software for clinical note transcription with the patient, who gave verbal consent to proceed.  History of Present Illness Ashley Neal is a 78 year old female with Parkinson's disease who presents for a four-month follow-up regarding pain management.  She experiences coccyx pain with a maximum intensity of seven, but the more concerning issue is severe muscle spasms in the lower lumbar area around her waist, described as 'literally breathtaking and stop her right in her tracks.' She is scheduled to start more physical therapy next month and hopes to attend an earlier session if a cancellation occurs. She is seeing sports medicine at this time. She has seen neurosurgery in the past but unable to offer any more interventions.  She is currently taking Sinemet , one tablet five times a day, after reducing from two tablets due to orthostatic hypotension. She experiences tremors and dyskinesia in her legs, particularly at night. Her son has installed a bar under her mattress to assist her in getting out of bed, as she previously fell and hit her head due to  orthostatic hypotension.   She is on Lexapro for mood management, which she sometimes forgets to take. She experiences more anxiety than depression, particularly on bad days when dealing with her back pain. She has been dealing with back pain for three years and misses walking comfortably. She practices yoga and 'big and loud exercises' at home with YouTube guidance.  She has tried Tylenol  and Aleve or Advil for pain relief, which worked well for her muscle spasms yesterday afternoon. She is interested in using a TENS unit  for pain.   She has a history of endometrial cancer and sees Dr. Orvil Bland, her GYN oncologist, for follow-up. She had to cancel an eye appointment due to the retirement of her previous doctor and needs to reschedule a mammogram, which was missed due to low blood pressure and dizziness.  She has signed up for [facility] and has been in contact with a lady who provides home care services. She is aware of local nursing homes and their facilities but prefers to remain in her own home despite concerns about future mobility and communication.  She has transportation issues and prefers not to drive long distances due to her husband's health problems and her own low blood pressure. She is seeking a neurologist in [place] for her Parkinson's management but has faced challenges in securing appointments with preferred doctors.    Review of Systems:  Review of Systems  Constitutional:  Negative  for chills, fever and weight loss.  HENT:  Negative for tinnitus.   Respiratory:  Negative for cough, sputum production and shortness of breath.   Cardiovascular:  Negative for chest pain, palpitations and leg swelling.  Gastrointestinal:  Negative for abdominal pain, constipation, diarrhea and heartburn.  Genitourinary:  Negative for dysuria, frequency and urgency.  Musculoskeletal:  Positive for back pain and myalgias. Negative for falls and joint pain.  Skin: Negative.    Neurological:  Negative for dizziness and headaches.  Psychiatric/Behavioral:  Negative for depression and memory loss. The patient is not nervous/anxious and does not have insomnia.     Past Medical History:  Diagnosis Date   Anxiety    Basal cell carcinoma    Depression    Hypertension    Neuromuscular disorder (HCC)    Osteoarthritis    Osteopenia    Parkinson disease (HCC)    Pneumonia    Tremors of nervous system    legs   uterine ca 10/08/2021   also endometrial cancer   Past Surgical History:  Procedure Laterality Date   CARPAL TUNNEL RELEASE Bilateral 1996/1997   CYSTOSCOPY N/A 10/08/2021   Procedure: CYSTOSCOPY;  Surgeon: Suzi Essex, MD;  Location: [facility];  Service: Gynecology;  Laterality: N/A;   ROBOTIC ASSISTED TOTAL HYSTERECTOMY WITH BILATERAL SALPINGO OOPHERECTOMY N/A 10/08/2021   Procedure: XI ROBOTIC ASSISTED TOTAL HYSTERECTOMY WITH BILATERAL SALPINGO OOPHORECTOMY;SENTINEL LYMPH NODE INJECTION;  Surgeon: Suzi Essex, MD;  Location: [facility];  Service: Gynecology;  Laterality: N/A;   SQUAMOUS CELL CARCINOMA EXCISION Left 2017   Left jawline   TUBAL LIGATION  1980's   Social History:   reports that she has never smoked. She has never been exposed to tobacco smoke. She has never used smokeless tobacco. She reports that she does not currently use alcohol. She reports that she does not currently use drugs.  Family History  Problem Relation Age of Onset   Endometrial cancer Mother    Diabetes Mother    Alzheimer's disease Father    Coronary artery disease Father    Diabetes Sister    COPD Sister    Diabetes Sister    Heart disease Brother    Other Brother        MVA    Parkinson's disease Brother    Heart disease Brother    Alcohol abuse Brother    Heart disease Brother    Parkinson's disease Maternal Aunt    Breast cancer Maternal Aunt    Colon cancer Paternal Aunt    Parkinson's disease Paternal Aunt    Lung cancer Paternal Uncle     Healthy Son    Healthy Son    Prostate cancer Neg Hx    Pancreatic cancer Neg Hx    Ovarian cancer Neg Hx     Medications: Patient's Medications  New Prescriptions   No medications on file  Previous Medications   CARBIDOPA -LEVODOPA  (SINEMET  IR) 25-100 MG TABLET    Take 1 tablet by mouth 5 (five) times daily.   ESCITALOPRAM (LEXAPRO) 20 MG TABLET    Take 20 mg by mouth daily.   LIDOCAINE  (LIDODERM ) 5 %    Place 2 patches onto the skin daily. Apply to coccyx/pelvis area. Remove & Discard patch within 12 hours or as directed by MD   MULTIPLE VITAMIN (MULTI-VITAMIN) TABLET    Take 1 tablet by mouth daily.  Modified Medications   No medications on file  Discontinued Medications   No medications on file  Physical Exam:  Vitals:   12/28/23 1315  BP: 126/84  Pulse: 88  Temp: (!) 97.2 F (36.2 C)  SpO2: 92%  Weight: 156 lb (70.8 kg)  Height: 5\' 1"  (1.549 m)   Body mass index is 29.48 kg/m. Wt Readings from Last 3 Encounters:  12/28/23 156 lb (70.8 kg)  12/23/23 155 lb (70.3 kg)  11/27/23 156 lb 6.4 oz (70.9 kg)    Physical Exam Constitutional:      General: She is not in acute distress.    Appearance: She is well-developed. She is not diaphoretic.  HENT:     Head: Normocephalic and atraumatic.     Mouth/Throat:     Pharynx: No oropharyngeal exudate.  Eyes:     Conjunctiva/sclera: Conjunctivae normal.     Pupils: Pupils are equal, round, and reactive to light.  Cardiovascular:     Rate and Rhythm: Normal rate and regular rhythm.     Heart sounds: Normal heart sounds.  Pulmonary:     Effort: Pulmonary effort is normal.     Breath sounds: Normal breath sounds.  Abdominal:     General: Bowel sounds are normal.     Palpations: Abdomen is soft.  Musculoskeletal:     Cervical back: Normal range of motion and neck supple.     Right lower leg: No edema.     Left lower leg: No edema.  Skin:    General: Skin is warm and dry.  Neurological:     Mental Status: She is  alert.     Motor: Weakness present.     Gait: Gait abnormal.  Psychiatric:        Mood and Affect: Mood normal.     Labs reviewed: Basic Metabolic Panel: Recent Labs    05/27/23 2329 06/19/23 1430 07/13/23 1040  NA 138 140 140  K 3.4* 4.4 3.9  CL 102 105 104  CO2 25 28 29   GLUCOSE 108* 137 115*  BUN 8 7 14   CREATININE 1.00 1.03* 0.77  CALCIUM 9.7 10.4 9.6   Liver Function Tests: Recent Labs    05/26/23 1242  AST 9*  ALT <5  ALKPHOS 79  BILITOT 1.1  PROT 6.4*  ALBUMIN 4.0   Recent Labs    05/26/23 1242  LIPASE 31   No results for input(s): "AMMONIA" in the last 8760 hours. CBC: Recent Labs    05/26/23 1242 05/27/23 2329  WBC 4.9 4.6  NEUTROABS 3.6 2.9  HGB 13.6 13.6  HCT 39.9 38.8  MCV 88.7 86.2  PLT 205 228   Lipid Panel: No results for input(s): "CHOL", "HDL", "LDLCALC", "TRIG", "CHOLHDL", "LDLDIRECT" in the last 8760 hours. TSH: No results for input(s): "TSH" in the last 8760 hours. A1C: No results found for: "HGBA1C"   Assessment/Plan Assessment and Plan Assessment & Plan Parkinson's disease Managed with Sinemet . Experiences tremors, dyskinesia, and orthostatic hypotension. Uses bed assist bar. Continues yoga and exercises. Desires neurologist consultation for Jonette Nestle which was placed - Continue Sinemet  at one tablet five times daily. - Encourage continuation of yoga and big and loud exercises. - Refer to neurologist in [place] for further management.  Orthostatic hypotension Managed by reducing Sinemet  dosage. Experiences low blood pressure episodes, especially when getting out of bed. - Monitor blood pressure regularly. - Advise caution when getting out of bed to prevent falls.  Lumbar pain Pain in coccyx and lower lumbar region with muscle spasms. Intolerant to pain medications. Tylenol  with Aleve or Advil provides relief. Referred to physical  therapy. Considering TENS unit. Lidocaine  patches are an option. - Initiate physical  therapy.  Depression Managed with Lexapro. Experiences more anxiety than depression, especially on bad days with back pain. Occasionally forgets medication. Engages in self-care activities. - Continue Lexapro as prescribed. - Encourage continuation of self-care activities such as yoga and exercises.  Hx of Endometrial cancer Ongoing follow-up with GYN oncologist. Recent visit with no new concerns. - Continue regular follow-up with GYN oncologist.  hyperglycemia Follow up a1c   Return in about 4 months (around 04/29/2024) for routine follow up.  Gladis Soley K. Denney [facility] [phone]

## 2023-12-29 ENCOUNTER — Ambulatory Visit: Payer: Self-pay | Admitting: Nurse Practitioner

## 2023-12-29 LAB — COMPLETE METABOLIC PANEL WITHOUT GFR
AG Ratio: 2.2 (calc) (ref 1.0–2.5)
ALT: 4 U/L — ABNORMAL LOW (ref 6–29)
AST: 12 U/L (ref 10–35)
Albumin: 4.4 g/dL (ref 3.6–5.1)
Alkaline phosphatase (APISO): 60 U/L (ref 37–153)
BUN: 18 mg/dL (ref 7–25)
CO2: 29 mmol/L (ref 20–32)
Calcium: 9.9 mg/dL (ref 8.6–10.4)
Chloride: 107 mmol/L (ref 98–110)
Creat: 0.9 mg/dL (ref 0.60–1.00)
Globulin: 2 g/dL (ref 1.9–3.7)
Glucose, Bld: 108 mg/dL (ref 65–139)
Potassium: 4 mmol/L (ref 3.5–5.3)
Sodium: 143 mmol/L (ref 135–146)
Total Bilirubin: 0.6 mg/dL (ref 0.2–1.2)
Total Protein: 6.4 g/dL (ref 6.1–8.1)

## 2023-12-29 LAB — HEPATITIS C ANTIBODY: Hepatitis C Ab: NONREACTIVE

## 2023-12-29 LAB — HEMOGLOBIN A1C
Hgb A1c MFr Bld: 5.3 % (ref <5.7)
Mean Plasma Glucose: 105 mg/dL
eAG (mmol/L): 5.8 mmol/L

## 2023-12-29 LAB — CBC WITH DIFFERENTIAL/PLATELET
Absolute Lymphocytes: 1190 {cells}/uL (ref 850–3900)
Absolute Monocytes: 552 {cells}/uL (ref 200–950)
Basophils Absolute: 31 {cells}/uL (ref 0–200)
Basophils Relative: 0.5 %
Eosinophils Absolute: 37 {cells}/uL (ref 15–500)
Eosinophils Relative: 0.6 %
HCT: 40.8 % (ref 35.0–45.0)
Hemoglobin: 13.8 g/dL (ref 11.7–15.5)
MCH: 31.6 pg (ref 27.0–33.0)
MCHC: 33.8 g/dL (ref 32.0–36.0)
MCV: 93.4 fL (ref 80.0–100.0)
MPV: 10 fL (ref 7.5–12.5)
Monocytes Relative: 8.9 %
Neutro Abs: 4390 {cells}/uL (ref 1500–7800)
Neutrophils Relative %: 70.8 %
Platelets: 240 10*3/uL (ref 140–400)
RBC: 4.37 10*6/uL (ref 3.80–5.10)
RDW: 13.1 % (ref 11.0–15.0)
Total Lymphocyte: 19.2 %
WBC: 6.2 10*3/uL (ref 3.8–10.8)

## 2023-12-30 DIAGNOSIS — G20A1 Parkinson's disease without dyskinesia, without mention of fluctuations: Secondary | ICD-10-CM | POA: Diagnosis not present

## 2023-12-30 DIAGNOSIS — M5440 Lumbago with sciatica, unspecified side: Secondary | ICD-10-CM | POA: Diagnosis not present

## 2024-01-07 ENCOUNTER — Ambulatory Visit: Payer: PPO

## 2024-01-07 ENCOUNTER — Ambulatory Visit: Payer: PPO | Attending: Nurse Practitioner | Admitting: Occupational Therapy

## 2024-01-07 ENCOUNTER — Ambulatory Visit: Payer: PPO | Admitting: Physical Therapy

## 2024-01-07 ENCOUNTER — Telehealth: Payer: Self-pay | Admitting: Physical Therapy

## 2024-01-07 DIAGNOSIS — R471 Dysarthria and anarthria: Secondary | ICD-10-CM

## 2024-01-07 DIAGNOSIS — G20A1 Parkinson's disease without dyskinesia, without mention of fluctuations: Secondary | ICD-10-CM

## 2024-01-07 DIAGNOSIS — R2681 Unsteadiness on feet: Secondary | ICD-10-CM

## 2024-01-07 DIAGNOSIS — R29818 Other symptoms and signs involving the nervous system: Secondary | ICD-10-CM | POA: Insufficient documentation

## 2024-01-07 NOTE — Telephone Encounter (Signed)
 Ashley Neal was seen for PT, OT, speech therapy screens in our multi-disciplinary clinic.  Physical and occupational therapy is recommended for follow up due to slowed mobility and coordination/ADL deficits due to Parkinson's disease.  If you agree, please send referral via Epic for physical and occupational therapy eval and treat.  Thank you.   Dessie Flow, PT 01/07/24 12:47 PM Phone: [phone] Fax: [phone]  [facility] at [facility] [address] [place], [address] [place], [place] [address] Phone # [phone] Fax # [phone]

## 2024-01-07 NOTE — Therapy (Signed)
 [name] [address] [place], [place], [address] Phone: [phone]   Fax:  [phone]  Patient Details  Name: Ashley Neal MRN: [id number] Date of Birth: 1946-02-19 Referring Provider:  Verma Gobble, NP  Encounter Date: 01/07/2024  Occupational Therapy Parkinson's Disease Screen  Hand dominance:  right   Physical Performance Test item #1 (handwriting - Whales live in a blue ocean): 14.97 sec.  Moderate micrographia with sentence, max micrographia with writing name.  100% legible with sentence, 75% legible with name.  Physical Performance Test item #2 (simulated eating):  24.38 sec  Fastening/unfastening 3 buttons in:  pt declined attempt, stating she does not wear shirts with buttons and is utilizing button hook intermittently if wearing buttons  9-hole peg test:    RUE  71.56 sec        LUE  71.32 sec  Change in ability to perform ADLs/IADLs:  Pt reports that she has been purchasing larger sized clothing to increase ease with getting shirts on/off and her son is aiding her in hooking/unhooking bra.  Pt reports difficulty with applying eye makeup.  SLP reports pt stating difficulty with handwriting, use of spoon, and buttons.    Pt would benefit from occupational therapy evaluation due to slowing coordination with self-feeding, clothing fasteners, and use of utensils with self-feeding.      Anthonette Kinsman, OTR/L 01/07/2024, 10:50 AM  [place] [facility] [address], [address] [place], [place], [address] Phone: [phone]   Fax:  [phone]

## 2024-01-07 NOTE — Therapy (Signed)
 [facility] [address], [address] [place], [place], [address] Phone: [phone]   Fax:  [phone]  Patient Details  Name: Ashley Neal MRN: [id number] Date of Birth: Jan 04, 1946 Referring Provider: Lamont Pilsner, MD  Encounter Date: 01/07/2024  Speech Therapy Parkinson's Disease Screen   Decibel Level today: 70dB  (WNL=70-72 dB) with sound level meter 30cm away from pt's mouth. Pt's conversational volume has remained WNL since last treatment course.  Pt does not report difficulty with swallowing, which does not warrant further evaluation.  Pt does not endorse changes in cognition.   Pt does does not require speech therapy services at this time. Recommend ST screen in another 6-8 months.   [facility], CCC-SLP 01/07/2024, 12:39 PM  [facility] [address], [address] [place], [place], [address] Phone: [phone]   Fax:  [phone]

## 2024-01-07 NOTE — Therapy (Signed)
 [facility] [address], [address] [place], [place], [address] Phone: [phone]   Fax:  [phone]  Patient Details  Name: Ashley Neal MRN: [id number] Date of Birth: [birth year] Referring Provider:  Verma Gobble, NP  Encounter Date: 01/07/2024  Physical Therapy Parkinson's Disease Screen   Timed Up and Go test:18.37 sec ( compared to 6.85 sec)  10 meter walk test:13.34 sec (2.46 ft/sec)  5 time sit to stand test:14.25 sec (compared to 7 sec)  Patient would benefit from Physical Therapy evaluation due to slowed mobility measures and report of falls since last bout of therapy (which ended 01/14/2023).   Pt reports she has had several falls in the past 6 months.  Feels PD is progressing.  She does the Big and loud exercises on YouTube and goes to [employer] for exercises.   Adreyan Carbajal W., PT 01/07/2024, 12:01 PM  [place] [facility] [address], [address] [place], [place], [address] Phone: [phone]   Fax:  [phone]

## 2024-01-08 ENCOUNTER — Other Ambulatory Visit: Payer: Self-pay

## 2024-01-08 NOTE — Telephone Encounter (Signed)
 Order has been placed.

## 2024-01-08 NOTE — Patient Outreach (Signed)
 Complex Care Management   Visit Note  01/08/2024  Name:  Ashley Neal MRN: [id number] DOB: April 12, 1946  Situation: Referral received for Complex Care Management related to Parkinson's disease; lumbar radiculopathy; Orthostatic Hypotension. I obtained verbal consent from Patient.  Visit completed with patient on the phone.  Background:   Past Medical History:  Diagnosis Date   Anxiety    Basal cell carcinoma    Depression    Hypertension    Neuromuscular disorder (HCC)    Osteoarthritis    Osteopenia    Parkinson disease (HCC)    Pneumonia    Tremors of nervous system    legs   uterine ca 10/08/2021   also endometrial cancer    Assessment: Patient Reported Symptoms:  Cognitive Cognitive Status: Alert and oriented to person, place, and time Cognitive/Intellectual Conditions Management [RPT]: None reported or documented in medical history or problem list   Health Maintenance Behaviors: Annual physical exam, Exercise, Hobbies, Healthy diet, Social activities Health Facilitated by: Healthy diet, Stress management, Rest, Pain control  Neurological Neurological Review of Symptoms: Other: [place] Neurological Symptoms/Conditions [RPT]: tremors, dyskensia Neurological Conditions: Parkinson's disease Neurological Management Strategies: Exercise, Medication therapy, Coping strategies, Routine screening Neurological Self-Management Outcome: 4 (good)  HEENT HEENT Symptoms Reported: No symptoms reported      Cardiovascular Cardiovascular Symptoms Reported: No symptoms reported Does patient have uncontrolled Hypertension?: No Cardiovascular Conditions: Orthostatic Hypotension Cardiovascular Management Strategies: Routine screening, Diet modification  Respiratory Respiratory Symptoms Reported: No symptoms reported    Endocrine Patient reports the following symptoms related to hypoglycemia or hyperglycemia : No symptoms reported Is patient diabetic?: No    Gastrointestinal  Gastrointestinal Symptoms Reported: Nausea Gastrointestinal Conditions: Nausea Gastrointestinal Management Strategies: Medication therapy Gastrointestinal Self-Management Outcome: 4 (good) Nutrition Risk Screen (CP): No indicators present  Genitourinary Genitourinary Symptoms Reported: Urgency    Integumentary Integumentary Symptoms Reported: Night sweats, Other Other Integumentary Symptoms: history of basal cell and squamous cell carcinoma face and wrist Additional Integumentary Details: occassional night sweats Skin Management Strategies: Routine screening Skin Self-Management Outcome: 4 (good)  Musculoskeletal Musculoskelatal Symptoms Reviewed: Muscle pain, Unsteady gait Musculoskeletal Conditions: Unsteady gait, Mobility limited, Back pain, Osteoarthritis, Other, Joint pain Other Musculoskeletal Conditions: Parkinson's disease; Lumbar Radiculopathy Musculoskeletal Management Strategies: Routine screening, Medication therapy Musculoskeletal Self-Management Outcome: 3 (uncertain) Musculoskeletal Comment: Parkinson's disease Falls in the past year?: Yes Number of falls in past year: 1 or less Was there an injury with Fall?: Yes Fall Risk Category Calculator: 2 Patient Fall Risk Level: Moderate Fall Risk Patient at Risk for Falls Due to: History of fall(s), Impaired balance/gait, Impaired mobility Fall risk Follow up: Falls evaluation completed, Education provided  Psychosocial Psychosocial Symptoms Reported: No symptoms reported   Major Change/Loss/Stressor/Fears (CP): Medical condition, self Techniques to Cope with Loss/Stress/Change: Diversional activities, Medication Quality of Family Relationships: helpful, involved, supportive Do you feel physically threatened by others?: No      01/08/2024    1:55 PM  Depression screen PHQ 2/9  Decreased Interest 0  Down, Depressed, Hopeless 0  PHQ - 2 Score 0    Vitals:   01/08/24 1338  BP: 114/68    Medications Reviewed Today      Reviewed by Kaylene Pascal, RN (Registered Nurse) on 01/08/24 at 1348  Med List Status: <None>   Medication Order Taking? Sig Documenting Provider Last Dose Status Informant  carbidopa -levodopa  (SINEMET  IR) 25-100 MG tablet [id number] Yes Take 1 tablet by mouth 5 (five) times daily. [provider] Taking Active   escitalopram (  LEXAPRO) 20 MG tablet [id number] Yes Take 20 mg by mouth daily. [provider] Taking Active   Multiple Vitamin (MULTI-VITAMIN) tablet [id number] Yes Take 1 tablet by mouth daily. [provider] Taking Active             Recommendation:   Specialty provider follow-up Ortho evaluation scheduled for 01/14/24 at 1:15 PM  Follow Up Plan:   Telephone follow up appointment date/time:  Tuesday, June 3 at 1:00 PM  Louanne Roussel RN BSN CCM [place]  [facility] Nurse Care Coordinator  Direct Dial: [phone] Website: [email or web address]

## 2024-01-11 NOTE — Patient Instructions (Signed)
 Visit Information  Thank you for taking time to visit with me today. Please don't hesitate to contact me if I can be of assistance to you before our next scheduled appointment.  Our next appointment is by telephone on Monday, July 7 at 11:00 AM Please call the care guide team at [phone] if you need to cancel or reschedule your appointment.   Following is a copy of your care plan:   Goals Addressed             This Visit's Progress    VBCI RN Care Plan related Parkinson's disease       Problems:  Chronic Disease Management support and education needs related to Parkinson's disease  Goal: Over the next 90 days the Patient will continue to work with RN Care Manager and/or Social Worker to address care management and care coordination needs related to Parkinson's disease as evidenced by adherence to care management team scheduled appointments      Interventions:   Evaluation of current treatment plan related to Parkinson's disease, self-management and patient's adherence to plan as established by provider Educated patient about basic disease process related to Parkinson's disease Reviewed medications with patient and discussed importance of medication adherence, completed medication reconciliation, no discrepancies noted  Completed Fall Assessment  Assessed for DME needs  Reviewed and discussed with patient her new patient consultation with Dr. Omar Bibber, Neurologist scheduled for 04/21/24 at 1:15 PM Assessed for SDOH barriers, none identified at this time  Discussed plans with patient for ongoing care management follow up and provided patient with direct contact information for care management team   Patient Self-Care Activities:  Attend all scheduled provider appointments Call pharmacy for medication refills 3-7 days in advance of running out of medications Call provider office for new concerns or questions  Take medications as prescribed   Work with the nurse care manager to  address care coordination needs and will continue to work with the clinical team to address health care and disease management related needs  Plan:  Follow up with provider re: [facility] [place] evaluation [facility] scheduled for 01/14/24 @1 :15 PM Telephone follow up appointment with care management team member scheduled for:  Monday, July 7 at 11:00 AM             Please call 1-800-273-TALK (toll free, 24 hour hotline) if you are experiencing a Mental Health or Behavioral Health Crisis or need someone to talk to.  Patient verbalizes understanding of instructions and care plan provided today and agrees to view in MyChart. Active MyChart status and patient understanding of how to access instructions and care plan via MyChart confirmed with patient.     Louanne Roussel RN BSN CCM [place]  [facility], [facility] Nurse Care Coordinator  Direct Dial: [phone] Website: [email or web address]

## 2024-01-12 ENCOUNTER — Telehealth

## 2024-01-14 ENCOUNTER — Ambulatory Visit (HOSPITAL_BASED_OUTPATIENT_CLINIC_OR_DEPARTMENT_OTHER): Admitting: Physical Therapy

## 2024-01-19 ENCOUNTER — Ambulatory Visit (HOSPITAL_BASED_OUTPATIENT_CLINIC_OR_DEPARTMENT_OTHER): Admitting: Radiology

## 2024-01-20 NOTE — Telephone Encounter (Signed)
 Please advise...  Copied from CRM [id number]. Topic: Referral - Question >> Jan 20, 2024  1:19 PM Adrianna P wrote: Reason for CRM: Patient has not been reached out to yet for OT and PT for lumbar spondylosis, and was wondering when she would get an appt, Please reach out to patient at [phone]

## 2024-01-26 ENCOUNTER — Ambulatory Visit (HOSPITAL_BASED_OUTPATIENT_CLINIC_OR_DEPARTMENT_OTHER): Admitting: Radiology

## 2024-01-30 DIAGNOSIS — M5440 Lumbago with sciatica, unspecified side: Secondary | ICD-10-CM | POA: Diagnosis not present

## 2024-01-30 DIAGNOSIS — G20A1 Parkinson's disease without dyskinesia, without mention of fluctuations: Secondary | ICD-10-CM | POA: Diagnosis not present

## 2024-02-02 ENCOUNTER — Encounter (HOSPITAL_BASED_OUTPATIENT_CLINIC_OR_DEPARTMENT_OTHER): Payer: Self-pay | Admitting: Radiology

## 2024-02-02 ENCOUNTER — Ambulatory Visit (HOSPITAL_BASED_OUTPATIENT_CLINIC_OR_DEPARTMENT_OTHER)
Admission: RE | Admit: 2024-02-02 | Discharge: 2024-02-02 | Disposition: A | Discharge status: Home / Self Care | Source: Ambulatory Visit | Attending: Nurse Practitioner | Admitting: Nurse Practitioner

## 2024-02-02 ENCOUNTER — Ambulatory Visit (HOSPITAL_BASED_OUTPATIENT_CLINIC_OR_DEPARTMENT_OTHER): Admitting: Physical Therapy

## 2024-02-02 DIAGNOSIS — Z1231 Encounter for screening mammogram for malignant neoplasm of breast: Secondary | ICD-10-CM | POA: Insufficient documentation

## 2024-02-08 DIAGNOSIS — M25521 Pain in right elbow: Secondary | ICD-10-CM | POA: Diagnosis not present

## 2024-02-08 DIAGNOSIS — M25531 Pain in right wrist: Secondary | ICD-10-CM | POA: Diagnosis not present

## 2024-02-08 DIAGNOSIS — W19XXXA Unspecified fall, initial encounter: Secondary | ICD-10-CM | POA: Diagnosis not present

## 2024-02-08 DIAGNOSIS — Z043 Encounter for examination and observation following other accident: Secondary | ICD-10-CM | POA: Diagnosis not present

## 2024-02-08 NOTE — Progress Notes (Signed)
 Gynecologic Oncology Return Clinic Visit  02/09/2024  Reason for Visit: Surveillance in the setting of endometrial cancer   Treatment History: Oncology History Overview Note  MMR IHC intact MS stable   Endometrial cancer (HCC)  08/31/2021 Imaging   MRI of the pelvis performed for low back pain on 08/31/2021 with incidental finding of endometrial thickening up to 1 cm, suboptimally evaluated.  There is no pelvic free fluid.  No adenopathy.  2.5 cm right ovarian cyst noted.  Other MRI findings: no hip fracture, dislocation or avascular necrosis.  There is mild osteoarthritis of bilateral hips.  There is a large high-grade partial-thickness tear of the left hamstring origin.  Partial-thickness tear of the left gluteus medius tendon insertion.  Mild osteoarthritis of bilateral SI joints also noted.   09/04/2021 Imaging   Pelvic ultrasound exam on 09/04/2021 shows a uterus measuring 6.4 x 3.1 x 4.2 cm with an endometrial lining of 1.1 cm with fluid.  Irregular appearance of the right ovary with simple follicle measuring up to 5 mm, avascular.  Simple cyst adjacent to the right ovary measures up to 2.7 cm and also avascular.  Left ovary noted to be normal.   09/12/2021 Initial Biopsy   EMB: showed rare atypical glands, predominantly mucus with few admixed benign endocervical glands.  Although the rare glandular fragments with cribriform pattern and mild cytologic atypia are extremely scant, they are concerning for at least endometrial hyperplasia.   10/08/2021 Surgery   TRH/BSO, SLN injection with no mapping bilaterally, cystoscopy No residual hyperplasia seen on frozen, no malignancy   Findings:  On EUA, small mobile uterus. Normal upper abdominal survey. Normal appearing omentum and small and large bowel. Normal appendix. Uterus 8cm and normal in appearance with small fundal fibroid. Normal bilateral adnexa with evidence of prior BTL. ICG seen along posterior cervix, mapping not appreciated in either  pelvic LN basin. No obvious adenopathy. Given some difficulty with the uterine manipulator and trouble initially identifying plane between bladder and LUS and cervix, cystoscopy was performed. Bladder dome intact, good efflux noted from bilateral ureteral orifices.    10/08/2021 Pathology Results   A. UTERUS, CERVIX, BILATERAL FALLOPIAN TUBES AND OVARIES:  Invasive well differentiated mucinous endometrial adenocarcinoma, FIGO 1  Tumor invades greater than 50% of the myometrium (11 mm /18 mm)  Tumor arises within complex atypical mucinous hyperplasia involving a  polyp and the anterior and posterior endometrium  Adenomyosis  Background inactive endometrium with cystic change  Benign serous cyst papillary adenofibroma of right ovary  Chronic cervicitis with nabothian cysts  Benign left ovary and right and left fallopian tubes   ONCOLOGY TABLE:   UTERUS, CARCINOMA OR CARCINOSARCOMA: Resection   Procedure: Total hysterectomy and bilateral salpingo-oophorectomy  Histologic Type: Mucinous endometrial adenocarcinoma  Histologic Grade: Well differentiated, FIGO 1  Myometrial Invasion: Present       Depth of Myometrial Invasion (mm): 11 mm       Myometrial Thickness (mm): 18 mm       Percentage of Myometrial Invasion: 61%  Uterine Serosa Involvement: Not identified  Cervical stromal Involvement:[Not identified  Extent of involvement of other tissue/organs: Not identified  Peritoneal/Ascitic Fluid: Not submitted  Lymphovascular Invasion: Not identified  Regional Lymph Nodes: Not applicable (no lymph nodes submitted or found)   Distant Metastasis: Not applicable  Pathologic Stage Classification (pTNM, AJCC 8th Edition): pT1b, pN n/a  Ancillary Studies: MMR / MSI testing will be ordered  Representative Tumor Block: A5, A6  Comment(s): None    10/16/2021  Initial Diagnosis   Endometrial cancer (HCC)   11/06/2021 Surgery   Robotic-assisted bilateral pelvic lymph node dissection, right  para-aortic lymph node dissection, cystoscopy  Findings: On EUA, well-healed vaginal cuff.  Normal upper abdominal survey.  Normal-appearing omentum, small and large bowel.  Some filmy adhesions of the sigmoid epiploica to the left pelvic sidewall.  Significant fibrosis noted of bilateral pelvic sidewalls and the retroperitoneal spaces.  Nodular tissue along some areas recent surgical dissection, presumed to be inflammatory from surgery.  No obvious adenopathy.  Fibrosis made pelvic lymph node dissection very challenging with significant adherence of minimal lymphatic tissue to the right external iliac vein.  Anatomy quite challenging to delineate in the setting of this fibrosis as well as recent surgery.  Along the para-aortic lymph node beds, no significant tissue noted along the left aspect and due to retroperitoneal fibrosis, further dissection did not seem safe.  Along the right, what was presumed to be the IVC was displaced laterally.  Cystoscopy, bladder dome noted to be intact, good efflux noted from bilateral ureteral orifices.   11/06/2021 Pathology Results   A. LYMPH NODE, LEFT PELVIC, EXCISION:  - Four lymph nodes, negative for carcinoma (0/4)   B. PERITONEUM, BIOPSY:  - Peritonealized soft tissue with focal fat necrosis  - Negative for carcinoma   C. LYMPH NODE, RIGHT PELVIC, EXCISION:  - Five lymph nodes, negative for carcinoma (0/5)   D. ROUND LIGAMENT REMNANT, EXCISION:  - Negative for carcinoma   E. PARA-AORTIC, RIGHT, EXCISION:  - Lymph node, negative for carcinoma (0   12/18/2021 - 01/15/2022 Radiation Therapy   Vaginal brachytherapy - 30 Gy     Interval History: Patient presents to the office for continued follow up. She continues to have multiple physical challenges related to parkinson disease. She has struggled with multiple falls, dizziness, hypotensive episodes. She had a recent fall yesterday injuring her right arm. Appetite is poor with intermittent nausea. She tries  to stay hydrated throughout the day. She is taking a caffeine pill daily to help with her BP.   No abdominal pain or abnormal bloating reported.  No issues voiced with healed incisions.  Having more incontinence. Has heard about the purewick device.  No vaginal bleeding or discharge.  Bowels are functioning at baseline with constipation improved from last visit.  No pain or new changes in lower extremity edema.  Overall, no concerning symptoms of endometrial cancer recurrence voiced.   Past Medical/Surgical History: Past Medical History:  Diagnosis Date   Anxiety    Basal cell carcinoma    Depression    Hypertension    Neuromuscular disorder (HCC)    Osteoarthritis    Osteopenia    Parkinson disease (HCC)    Pneumonia    Tremors of nervous system    legs   uterine ca 10/08/2021   also endometrial cancer    Past Surgical History:  Procedure Laterality Date   CARPAL TUNNEL RELEASE Bilateral 1996/1997   CYSTOSCOPY N/A 10/08/2021   Procedure: CYSTOSCOPY;  Surgeon: Viktoria Comer SAUNDERS, MD;  Location: [facility];  Service: Gynecology;  Laterality: N/A;   ROBOTIC ASSISTED TOTAL HYSTERECTOMY WITH BILATERAL SALPINGO OOPHERECTOMY N/A 10/08/2021   Procedure: XI ROBOTIC ASSISTED TOTAL HYSTERECTOMY WITH BILATERAL SALPINGO OOPHORECTOMY;SENTINEL LYMPH NODE INJECTION;  Surgeon: Viktoria Comer SAUNDERS, MD;  Location: [facility];  Service: Gynecology;  Laterality: N/A;   SQUAMOUS CELL CARCINOMA EXCISION Left 2017   Left jawline   TUBAL LIGATION  1980's    Family History  Problem  Relation Age of Onset   Endometrial cancer Mother    Diabetes Mother    Alzheimer's disease Father    Coronary artery disease Father    Diabetes Sister    COPD Sister    Diabetes Sister    Heart disease Brother    Other Brother        MVA    Parkinson's disease Brother    Heart disease Brother    Alcohol abuse Brother    Heart disease Brother    Parkinson's disease Maternal Aunt    Breast cancer Maternal Aunt    Colon  cancer Paternal Aunt    Parkinson's disease Paternal Aunt    Lung cancer Paternal Uncle    Healthy Son    Healthy Son    Prostate cancer Neg Hx    Pancreatic cancer Neg Hx    Ovarian cancer Neg Hx     Social History   Socioeconomic History   Marital status: Married    Spouse name: Gwenn Patella   Number of children: 2   Years of education: 16   Highest education level: Bachelor's degree (e.g., BA, AB, BS)  Occupational History   Occupation: retired    Comment: Advertising account planner  Tobacco Use   Smoking status: Never    Passive exposure: Never   Smokeless tobacco: Never  Vaping Use   Vaping status: Never Used  Substance and Sexual Activity   Alcohol use: Not Currently    Comment: rarely   Drug use: Not Currently   Sexual activity: Not Currently    Partners: Male  Other Topics Concern   Not on file  Social History Narrative   Not on file   Social Drivers of Health   Financial Resource Strain: Low Risk  (01/08/2024)   Overall Financial Resource Strain (CARDIA)    Difficulty of Paying Living Expenses: Not very hard  Food Insecurity: No Food Insecurity (01/08/2024)   Hunger Vital Sign    Worried About Running Out of Food in the Last Year: Never true    Ran Out of Food in the Last Year: Never true  Transportation Needs: No Transportation Needs (01/08/2024)   PRAPARE - Administrator, Civil Service (Medical): No    Lack of Transportation (Non-Medical): No  Recent Concern: Transportation Needs - Unmet Transportation Needs (12/08/2023)   PRAPARE - Administrator, Civil Service (Medical): Yes    Lack of Transportation (Non-Medical): No  Physical Activity: Sufficiently Active (01/08/2024)   Exercise Vital Sign    Days of Exercise per Week: 7 days    Minutes of Exercise per Session: 60 min  Stress: No Stress Concern Present (01/08/2024)   [employer] of Occupational Health - Occupational Stress Questionnaire    Feeling of Stress : Only a little   Social Connections: Socially Integrated (01/08/2024)   Social Connection and Isolation Panel    Frequency of Communication with Friends and Family: More than three times a week    Frequency of Social Gatherings with Friends and Family: Twice a week    Attends Religious Services: More than 4 times per year    Active Member of [employer] or Organizations: Yes    Attends Banker Meetings: 1 to 4 times per year    Marital Status: Married    Current Medications:  Current Outpatient Medications:    caffeine 200 MG TABS tablet, Take 200 mg by mouth daily., Disp: , Rfl:    carbidopa -levodopa  (SINEMET  IR) 25-100 MG  tablet, Take 1 tablet by mouth 5 (five) times daily., Disp: , Rfl:    escitalopram (LEXAPRO) 20 MG tablet, Take 20 mg by mouth daily., Disp: , Rfl:    Multiple Vitamin (MULTI-VITAMIN) tablet, Take 1 tablet by mouth daily., Disp: , Rfl:   Review of Systems: See interval history. No new symptoms reported.  Physical Exam: BP 121/65 (BP Location: Left Arm, Patient Position: Sitting)   Pulse 93   Temp 97.8 F (36.6 C) (Oral)   Resp 18   Wt 157 lb 6.4 oz (71.4 kg)   SpO2 99%   BMI 29.74 kg/m  General: Alert, oriented, no acute distress. HEENT: Normocephalic, atraumatic, sclera anicteric. Chest: Clear to auscultation bilaterally.  Wheezes or rhonchi. Cardiovascular: Regular rate and rhythm, no murmurs. Abdomen: soft, nontender.  Normoactive bowel sounds.  No masses or hepatosplenomegaly appreciated.  Well-healed incisions. Extremities: Grossly normal range of motion.  Warm, well perfused.  No edema bilaterally. Limited range of motion with R arm due to recent fall. Skin: No rashes or lesions noted. Lymphatics: No cervical, supraclavicular, or inguinal adenopathy. GU: Normal appearing external genitalia without erythema, excoriation, or lesions.  Speculum exam reveals moderately atrophic vaginal mucosa, some radiation changes noted at the vaginal apex.  Bimanual exam reveals  no nodularity or masses.  Rectovaginal exam confirms findings. Soft stool noted in the rectum.  Laboratory & Radiologic Studies: Labs in [facility] from 05/26/2023, 05/27/2023, 06/11/2023, 06/19/2023, 12/28/2023 Mammogram 02/02/2024  Assessment & Plan: Ashley Neal is a 78 y.o. woman with Stage IB grade 1 mucinous endometrial adenocarcinoma who presents for follow-up after completion of adjuvant vaginal brachytherapy in 01/2022. MSS, MMRp.   Patient is doing well from a cancer standpoint; she is NED on exam today. She has current challenges with neurologic/parkinson's symptoms and has follow up with her providers. Continued support given.   We will continue with NCCN surveillance recommendations.  In the setting of high-intermediate risk, after completion of adjuvant treatment, recommendation remains for surveillance visits every 3 months for 2-3 years. Follow up has been arranged with Dr. Viktoria in three months. She is advised to call the office sooner for any needs, new symptoms. We discussed signs and symptoms that would be concerning for cancer recurrence and I stressed the importance of calling if she develops any of these.  20 minutes of total time was spent for this patient encounter, including preparation, face-to-face counseling with the patient and coordination of care, and documentation of the encounter.  Eleanor Epps NP [facility]

## 2024-02-09 ENCOUNTER — Telehealth: Payer: Self-pay

## 2024-02-09 ENCOUNTER — Inpatient Hospital Stay: Attending: Gynecologic Oncology | Admitting: Gynecologic Oncology

## 2024-02-09 VITALS — BP 121/65 | HR 93 | Temp 97.8°F | Resp 18 | Wt 157.4 lb

## 2024-02-09 DIAGNOSIS — Z90722 Acquired absence of ovaries, bilateral: Secondary | ICD-10-CM | POA: Insufficient documentation

## 2024-02-09 DIAGNOSIS — C541 Malignant neoplasm of endometrium: Secondary | ICD-10-CM

## 2024-02-09 DIAGNOSIS — Z8542 Personal history of malignant neoplasm of other parts of uterus: Secondary | ICD-10-CM | POA: Diagnosis not present

## 2024-02-09 DIAGNOSIS — G20A1 Parkinson's disease without dyskinesia, without mention of fluctuations: Secondary | ICD-10-CM | POA: Diagnosis not present

## 2024-02-09 DIAGNOSIS — Z90710 Acquired absence of both cervix and uterus: Secondary | ICD-10-CM | POA: Diagnosis not present

## 2024-02-09 DIAGNOSIS — Z9079 Acquired absence of other genital organ(s): Secondary | ICD-10-CM | POA: Insufficient documentation

## 2024-02-09 NOTE — Telephone Encounter (Signed)
 Ashley Neal (Ashley Neal) Rx #: [id number]  There is a previously denied request

## 2024-02-09 NOTE — Patient Instructions (Addendum)
 It was good to see you today.  No concerning findings for cancer return on your exam today.   We will see you for follow-up in 3 months with Dr. Viktoria. After this appointment, we will transition to visits every six months.   As always, if you develop any new and concerning symptoms before your next visit, please call to see me sooner.  Symptoms to report to your health care team include vaginal bleeding, rectal bleeding, bloating, weight loss without effort, new and persistent pain, new and  persistent fatigue, new leg swelling, new masses (i.e., bumps in your neck or groin), new and persistent cough, new and persistent nausea and vomiting, change in bowel or bladder habits, and any other concerns.

## 2024-02-09 NOTE — Telephone Encounter (Signed)
 Ashley Neal ([name]) Rx #: [id number] Information regarding your request There is a previously denied request on file.

## 2024-02-10 ENCOUNTER — Encounter: Payer: Self-pay | Admitting: Physical Therapy

## 2024-02-10 ENCOUNTER — Ambulatory Visit: Admitting: Physical Therapy

## 2024-02-10 ENCOUNTER — Other Ambulatory Visit: Payer: Self-pay

## 2024-02-10 ENCOUNTER — Ambulatory Visit: Attending: Nurse Practitioner | Admitting: Occupational Therapy

## 2024-02-10 DIAGNOSIS — G20A1 Parkinson's disease without dyskinesia, without mention of fluctuations: Secondary | ICD-10-CM | POA: Insufficient documentation

## 2024-02-10 DIAGNOSIS — R278 Other lack of coordination: Secondary | ICD-10-CM | POA: Insufficient documentation

## 2024-02-10 DIAGNOSIS — M6281 Muscle weakness (generalized): Secondary | ICD-10-CM | POA: Diagnosis not present

## 2024-02-10 DIAGNOSIS — R29818 Other symptoms and signs involving the nervous system: Secondary | ICD-10-CM | POA: Diagnosis not present

## 2024-02-10 DIAGNOSIS — R2681 Unsteadiness on feet: Secondary | ICD-10-CM | POA: Insufficient documentation

## 2024-02-10 DIAGNOSIS — R269 Unspecified abnormalities of gait and mobility: Secondary | ICD-10-CM | POA: Diagnosis not present

## 2024-02-10 NOTE — Therapy (Signed)
 OUTPATIENT PHYSICAL THERAPY NEURO EVALUATION   Patient Name: Ashley Neal MRN: [id number] DOB:[birth year], 78 y.o., female Today's Date: 02/10/2024   PCP: Caro Harlene POUR, NP  REFERRING PROVIDER: Caro Harlene POUR, NP   END OF SESSION:  PT End of Session - 02/10/24 1109     Visit Number 1    Number of Visits 9    Date for PT Re-Evaluation 04/08/24    Authorization Type [facility]    PT Start Time 1106    PT Stop Time 1146    PT Time Calculation (min) 40 min    Activity Tolerance Patient tolerated treatment well    Behavior During Therapy WFL for tasks assessed/performed          Past Medical History:  Diagnosis Date   Anxiety    Basal cell carcinoma    Depression    Hypertension    Neuromuscular disorder (HCC)    Osteoarthritis    Osteopenia    Parkinson disease (HCC)    Pneumonia    Tremors of nervous system    legs   uterine ca 10/08/2021   also endometrial cancer   Past Surgical History:  Procedure Laterality Date   CARPAL TUNNEL RELEASE Bilateral 1996/1997   CYSTOSCOPY N/A 10/08/2021   Procedure: CYSTOSCOPY;  Surgeon: Viktoria Comer SAUNDERS, MD;  Location: [facility];  Service: Gynecology;  Laterality: N/A;   ROBOTIC ASSISTED TOTAL HYSTERECTOMY WITH BILATERAL SALPINGO OOPHERECTOMY N/A 10/08/2021   Procedure: XI ROBOTIC ASSISTED TOTAL HYSTERECTOMY WITH BILATERAL SALPINGO OOPHORECTOMY;SENTINEL LYMPH NODE INJECTION;  Surgeon: Viktoria Comer SAUNDERS, MD;  Location: [facility];  Service: Gynecology;  Laterality: N/A;   SQUAMOUS CELL CARCINOMA EXCISION Left 2017   Left jawline   TUBAL LIGATION  1980's   Patient Active Problem List   Diagnosis Date Noted   Lumbar spondylosis 12/23/2023   Coccydynia 12/23/2023   Endometrial cancer (HCC) 10/16/2021   Complex atypical endometrial hyperplasia 09/27/2021   Primary osteoarthritis of both hands 05/14/2020   Primary osteoarthritis of both feet 05/14/2020   Age-related osteoporosis without current pathological fracture  05/14/2020   Prediabetes 05/14/2020   Essential hypertension 05/14/2020   History of hyperlipidemia 05/14/2020   History of squamous cell carcinoma 05/14/2020   Parkinsonism (HCC) 05/14/2020    ONSET DATE: PD screen 01/08/24  REFERRING DIAG: G20.A1 (ICD-10-CM) - Parkinson disease, symptomatic (HCC)   THERAPY DIAG:  Unsteadiness on feet  Muscle weakness (generalized)  Abnormality of gait and mobility  Other symptoms and signs involving the nervous system  Rationale for Evaluation and Treatment: Rehabilitation  SUBJECTIVE:  SUBJECTIVE STATEMENT: Parkinson's is no fun.  My back is really limiting my ability to exercise.  Walking is difficult due to the back, coccyx ache and pain.  I've been to therapy before and doctors and not sure what is going on with my back.  Did fall the other day trying to get off the sofa and sprained my wrist.  Have a hard time getting out of bed. Pt accompanied by: self  PERTINENT HISTORY: uterine cancer, endometrial cancer, hernia, PD, low back pain, OA  PAIN:  Are you having pain? Yes: NPRS scale: 4-8/10 Pain location: coccyx, lumbar spine Pain description: ache Aggravating factors: walking aggravates pain Relieving factors: sitting, lie on heating pad  PRECAUTIONS: Fall, orthostatic hypotension, Coccydynia with associated sacral discomfort, lumbar spondylosis   RED FLAGS: None   WEIGHT BEARING RESTRICTIONS: No  FALLS: Has patient fallen in last 6 months? Yes. Number of falls 2   LIVING ENVIRONMENT: Lives with: lives with their family and lives with their son Lives in: House/apartment Stairs: 2 steps from garage with rails Has following equipment at home: Environmental consultant - 4 wheeled and Wheelchair (manual)  PLOF: Independent  PATIENT GOALS: To be able to get out  of bed by myself (it currently takes 5-10 minutes); to get up off the sofa; to get dressed, to be able to walk better  OBJECTIVE:  Note: Objective measures were completed at Evaluation unless otherwise noted.  DIAGNOSTIC FINDINGS: No recent images for this episode  COGNITION: Overall cognitive status: Within functional limits for tasks assessed    POSTURE: rounded shoulders, forward head, and posterior pelvic tilt  PALPATION:  Tenderness to palpation along paraspinals lumbar>thoracic spine  LOWER EXTREMITY ROM:     Active  Right Eval Left Eval  Hip flexion    Hip extension    Hip abduction    Hip adduction    Hip internal rotation    Hip external rotation    Knee flexion    Knee extension    Ankle dorsiflexion neutral 5 degrees  Ankle plantarflexion    Ankle inversion    Ankle eversion     (Blank rows = not tested)  LOWER EXTREMITY MMT:    MMT Right Eval Left Eval  Hip flexion 4 4  Hip extension    Hip abduction    Hip adduction    Hip internal rotation    Hip external rotation    Knee flexion 4 4  Knee extension 4 4  Ankle dorsiflexion 3+ 3+  Ankle plantarflexion    Ankle inversion    Ankle eversion    (Blank rows = not tested)  VITALS:   131/89 HR 84 in sitting   105/75 HR 87 in sitting    TRANSFERS: Sit to stand: SBA  Assistive device utilized: UE support at chair rails     Stand to sit: SBA  Assistive device utilized: UE support      GAIT: Findings: Gait Characteristics: step through pattern, decreased arm swing- Right, decreased arm swing- Left, decreased step length- Right, decreased step length- Left, shuffling, festinating, and trunk flexed, Distance walked: 50 ft, Assistive device utilized:None, Level of assistance: SBA, and Comments: Fast pace, shuffling steps; gait is limited due to low back and coccyx pain, per pt report  FUNCTIONAL TESTS:  Timed up and go (TUG): 15.25 sec 10 meter walk test: 11.03 sec (2.97 ft/sec) TUG cognitive:  20.12  sec 3 M walk back:  attempted-only able to go 2 ft in 13.13 sec with hastening,  decreased foot clearance 360 R:  11.46 sec in 22 steps 360 L:  11.25 sec in 23 steps                                                                                                                               TREATMENT DATE: 02/10/2024    PATIENT EDUCATION: Education details: Eval results, POC Person educated: Patient Education method: Explanation Education comprehension: verbalized understanding  HOME EXERCISE PROGRAM: Not yet initiated  GOALS: Goals reviewed with patient? Yes  SHORT TERM GOALS: Target date: 03/11/2024  Pt will be independent with HEP for improved back pain, posture, balance, gait. Baseline: Goal status: INITIAL  2.  Pt will improve TUG score to less than or equal to 13 sec for decreased fall risk. Baseline: >15 sec Goal status: INITIAL  3.  Pt will perform 360 turns R and L in 15 steps or less, for improved balance. Baseline: 22-23 steps Goal status: INITIAL   4.  Pt will report at least 50% improvement in bed mobility.  Baseline  Goal status:  INITIAL  LONG TERM GOALS: Target date: 04/08/2024  Pt will be independent with HEP for improved balance, gait. Baseline:  Goal status: INITIAL  2.  Pt will improve TUG cognitive score to less than or equal to 15 sec for decreased fall risk.  Baseline: 20 sec Goal status: INITIAL  3.  Pt will perform 3 M backwards walk test in less than or equal to 15 seconds for improved balance. Baseline: unable to complete Goal status: INITIAL  4.  Pt will verbalize understanding of fall prevention in the home environment. Baseline:  Goal status: INITIAL  5.  Pt will report at least 50% reduction in low back pain, to improve overall functional mobility Baseline: 4-8/10 Goal status: INITIAL  ASSESSMENT:  CLINICAL IMPRESSION: Patient is a 78 y.o. female who was seen today for physical therapy evaluation and treatment for Parkinson's  disease.  She was seen for PD screen late May 2025 and was recommended for return PT eval due to overall mobility slowing and reports of falls.  She has had one recent fall from the sofa, causing her to sprain her wrist and two falls in the past 6 months.  She presents with decreased strength, decreased balance, abnormal posture, low back pain/coccyx pain, decreased timing and coordination of gait.  She is at increased fall risk per TUG, TUG cognitive, 56M walk backwards and 360 turn tests.  She does report and noted with 3 M walk backward, festination/freezing with gait.  She reports difficulty and slowness with bed mobility.  She will benefit from skilled PT to address the above stated deficits to decrease fall risk and improve overall functional mobility.   OBJECTIVE IMPAIRMENTS: Abnormal gait, decreased balance, decreased mobility, difficulty walking, decreased strength, increased muscle spasms, postural dysfunction, and pain.   ACTIVITY LIMITATIONS: bending, sitting, standing, transfers, bed mobility, hygiene/grooming, and locomotion level  PARTICIPATION LIMITATIONS: cleaning, laundry,  shopping, community activity, and exercise  PERSONAL FACTORS: 3+ comorbidities: see above are also affecting patient's functional outcome.   REHAB POTENTIAL: Good  CLINICAL DECISION MAKING: Evolving/moderate complexity  EVALUATION COMPLEXITY: Moderate  PLAN:  PT FREQUENCY: 1x/week (per patient request)  PT DURATION: 8 weeks plus eval  PLANNED INTERVENTIONS: 97750- Physical Performance Testing, 97110-Therapeutic exercises, 97530- Therapeutic activity, 97112- Neuromuscular re-education, 97535- Self Care, [id number]- Manual therapy, [phone]- Gait training, Patient/Family education, and Balance training  PLAN FOR NEXT SESSION: Initiate HEP, work on bed mobility, posture, back exercises, gait and balance exercise   Zierra Laroque W., PT 02/10/2024, 3:57 PM  [facility] at [facility] [address], [address] [place], [place] [address] Phone # [phone] Fax # [phone]
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
## 2024-02-15 ENCOUNTER — Other Ambulatory Visit: Payer: Self-pay

## 2024-02-15 NOTE — Patient Instructions (Signed)
 Visit Information  Thank you for taking time to visit with me today. Please don't hesitate to contact me if I can be of assistance to you before our next scheduled appointment.  Your next care management appointment is by telephone on Thursday, August 14 at 11:00 AM  Please call the care guide team at [phone] if you need to cancel, schedule, or reschedule an appointment.   Please call [phone]-TALK (toll free, 24 hour hotline) if you are experiencing a Mental Health or Behavioral Health Crisis or need someone to talk to.  Clayborne Ly RN BSN CCM [place]  [facility], [facility] Health Nurse Care Coordinator  Direct Dial: [phone] Website: [email or web address]

## 2024-02-15 NOTE — Patient Outreach (Signed)
 Complex Care Management   Visit Note  02/15/2024  Name:  Ashley Neal MRN: [id number] DOB: [birth year]  Situation: Referral received for Complex Care Management related to Parkinson's disease; lumbar radiculopathy; Orthostatic Hypotension, Falls. I obtained verbal consent from Patient.  Visit completed with patient on the phone.  Background:   Past Medical History:  Diagnosis Date   Anxiety    Basal cell carcinoma    Depression    Hypertension    Neuromuscular disorder (HCC)    Osteoarthritis    Osteopenia    Parkinson disease (HCC)    Pneumonia    Tremors of nervous system    legs   uterine ca 10/08/2021   also endometrial cancer    Assessment: Patient Reported Symptoms:  Cognitive Cognitive Status: Alert and oriented to person, place, and time Cognitive/Intellectual Conditions Management [RPT]: None reported or documented in medical history or problem list   Health Maintenance Behaviors: Annual physical exam, Exercise, Healthy diet, Social activities Health Facilitated by: Pain control, Rest, Healthy diet  Neurological Neurological Review of Symptoms: Other: [place] Neurological Symptoms/Conditions [RPT]: tremors, dyskensia, hallucinations, psuedobulbar affect Neurological Management Strategies: Routine screening, Exercise, Coping strategies, Medication therapy Neurological Self-Management Outcome: 3 (uncertain) Neurological Comment: Patient has upcoming scheduled f/u with Neurology  HEENT HEENT Symptoms Reported: Tinnitus HEENT Management Strategies: Routine screening HEENT Self-Management Outcome: 3 (uncertain)    Cardiovascular Cardiovascular Symptoms Reported: Other: Other Cardiovascular Symptoms: orthostatic hypotension Does patient have uncontrolled Hypertension?: No Cardiovascular Management Strategies: Routine screening, Fluid modification, Activity Cardiovascular Self-Management Outcome: 4 (good)  Respiratory      Endocrine Endocrine Symptoms Reported: No  symptoms reported    Gastrointestinal Gastrointestinal Symptoms Reported: No symptoms reported      Genitourinary Genitourinary Symptoms Reported: Incontinence Genitourinary Management Strategies: Incontinence garment/pad Genitourinary Self-Management Outcome: 3 (uncertain)  Integumentary Integumentary Symptoms Reported: No symptoms reported    Musculoskeletal Musculoskelatal Symptoms Reviewed: Back pain, Difficulty walking, Limited mobility, Muscle pain, Unsteady gait Additional Musculoskeletal Details: hx of Coccydynia and Lumbar Spondylosis Musculoskeletal Management Strategies: Routine screening, Adequate rest, Activity, Coping strategies, Medication therapy, Exercise Musculoskeletal Self-Management Outcome: 2 (bad) Musculoskeletal Comment: Patient completed her PT/OT evaluation, she will start ongoing PT/OT weekly x 8 weeks in the next week or 2 Falls in the past year?: Yes Number of falls in past year: 1 or less Was there an injury with Fall?: Yes Fall Risk Category Calculator: 2 Patient Fall Risk Level: Moderate Fall Risk Patient at Risk for Falls Due to: History of fall(s), Impaired balance/gait, Impaired mobility Fall risk Follow up: Falls evaluation completed, Education provided, Falls prevention discussed, Follow up appointment  Psychosocial Psychosocial Symptoms Reported: Anxiety - if selected complete GAD Behavioral Management Strategies: Coping strategies, Support system, Exercise, Medication therapy Behavioral Health Self-Management Outcome: 3 (uncertain) Major Change/Loss/Stressor/Fears (CP): Medical condition, self Techniques to Cope with Loss/Stress/Change: Exercise, Diversional activities, Medication Quality of Family Relationships: involved, supportive, helpful      01/08/2024    1:55 PM  Depression screen PHQ 2/9  Decreased Interest 0  Down, Depressed, Hopeless 0  PHQ - 2 Score 0    There were no vitals filed for this visit.  Medications Reviewed Today      Reviewed by Morgan Clayborne CROME, RN (Registered Nurse) on 02/15/24 at 1146  Med List Status: <None>   Medication Order Taking? Sig Documenting Provider Last Dose Status Informant  acetaminophen  (TYLENOL ) 500 MG tablet [id number] Yes Take 500 mg by mouth every 6 (six) hours as needed for moderate pain (pain score 4-6).  [provider]  Active   caffeine 200 MG TABS tablet [id number] Yes Take 200 mg by mouth daily. [provider]  Active            Med Note (   Tue Feb 09, 2024  2:45 PM) To raise BP per pt  carbidopa -levodopa  (SINEMET  IR) 25-100 MG tablet [id number] Yes Take 1 tablet by mouth 5 (five) times daily. [provider]  Active   escitalopram (LEXAPRO) 20 MG tablet [id number] Yes Take 20 mg by mouth daily. [provider]  Active   ibuprofen (ADVIL) 200 MG tablet [id number] Yes Take 200 mg by mouth every 6 (six) hours as needed for moderate pain (pain score 4-6). [provider]  Active   Multiple Vitamin (MULTI-VITAMIN) tablet [id number] Yes Take 1 tablet by mouth daily. [provider]  Active             Recommendation:   Keep your scheduled Neuro PT visit on 02/23/24 at 11:45 AM at [facility]  Follow up with Dr. Arthea Gunther on 03/02/24 at 1:40 PM as directed  Specialty provider follow-up with Zoe Harlene Jansky, FNP with [facility] via telemedicine on 03/21/24 at 7:50 AM  Follow Up Plan:   Telephone follow up appointment date/time:  Thursday, August 14 at 11:00 AM  Clayborne Ly RN BSN CCM [facility]  [facility] Health Nurse Care Coordinator  Direct Dial: [phone] Website: [email or web address]

## 2024-02-22 NOTE — Telephone Encounter (Signed)
 Received an N/A from insurance provider.

## 2024-02-23 ENCOUNTER — Ambulatory Visit

## 2024-02-23 ENCOUNTER — Ambulatory Visit: Admitting: Occupational Therapy

## 2024-02-23 DIAGNOSIS — G20A1 Parkinson's disease without dyskinesia, without mention of fluctuations: Secondary | ICD-10-CM

## 2024-02-23 DIAGNOSIS — M6281 Muscle weakness (generalized): Secondary | ICD-10-CM

## 2024-02-23 DIAGNOSIS — R278 Other lack of coordination: Secondary | ICD-10-CM

## 2024-02-23 DIAGNOSIS — R29818 Other symptoms and signs involving the nervous system: Secondary | ICD-10-CM

## 2024-02-23 DIAGNOSIS — R269 Unspecified abnormalities of gait and mobility: Secondary | ICD-10-CM

## 2024-02-23 DIAGNOSIS — R2681 Unsteadiness on feet: Secondary | ICD-10-CM
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
## 2024-02-23 NOTE — Therapy (Signed)
 OUTPATIENT PHYSICAL THERAPY NEURO TREATMENT   Patient Name: Ashley Neal MRN: [id number] DOB:02/18/1946, 78 y.o., female Today's Date: 02/23/2024   PCP: Caro Harlene POUR, NP  REFERRING PROVIDER: Caro Harlene POUR, NP   END OF SESSION:  PT End of Session - 02/23/24 1154     Visit Number 2    Number of Visits 9    Date for PT Re-Evaluation 04/08/24    Authorization Type [employer]    PT Start Time 1145    PT Stop Time 1230    PT Time Calculation (min) 45 min    Activity Tolerance Patient tolerated treatment well    Behavior During Therapy WFL for tasks assessed/performed          Past Medical History:  Diagnosis Date   Anxiety    Basal cell carcinoma    Depression    Hypertension    Neuromuscular disorder (HCC)    Osteoarthritis    Osteopenia    Parkinson disease (HCC)    Pneumonia    Tremors of nervous system    legs   uterine ca 10/08/2021   also endometrial cancer   Past Surgical History:  Procedure Laterality Date   CARPAL TUNNEL RELEASE Bilateral 1996/1997   CYSTOSCOPY N/A 10/08/2021   Procedure: CYSTOSCOPY;  Surgeon: Viktoria Comer SAUNDERS, MD;  Location: [facility];  Service: Gynecology;  Laterality: N/A;   ROBOTIC ASSISTED TOTAL HYSTERECTOMY WITH BILATERAL SALPINGO OOPHERECTOMY N/A 10/08/2021   Procedure: XI ROBOTIC ASSISTED TOTAL HYSTERECTOMY WITH BILATERAL SALPINGO OOPHORECTOMY;SENTINEL LYMPH NODE INJECTION;  Surgeon: Viktoria Comer SAUNDERS, MD;  Location: [facility];  Service: Gynecology;  Laterality: N/A;   SQUAMOUS CELL CARCINOMA EXCISION Left 2017   Left jawline   TUBAL LIGATION  1980's   Patient Active Problem List   Diagnosis Date Noted   Lumbar spondylosis 12/23/2023   Coccydynia 12/23/2023   Endometrial cancer (HCC) 10/16/2021   Complex atypical endometrial hyperplasia 09/27/2021   Primary osteoarthritis of both hands 05/14/2020   Primary osteoarthritis of both feet 05/14/2020   Age-related osteoporosis without current pathological fracture  05/14/2020   Prediabetes 05/14/2020   Essential hypertension 05/14/2020   History of hyperlipidemia 05/14/2020   History of squamous cell carcinoma 05/14/2020   Parkinsonism (HCC) 05/14/2020    ONSET DATE: PD screen 01/08/24  REFERRING DIAG: G20.A1 (ICD-10-CM) - Parkinson disease, symptomatic (HCC)   THERAPY DIAG:  Unsteadiness on feet  Muscle weakness (generalized)  Abnormality of gait and mobility  Other symptoms and signs involving the nervous system  Parkinson disease, symptomatic (HCC)  Rationale for Evaluation and Treatment: Rehabilitation  SUBJECTIVE:  SUBJECTIVE STATEMENT: The back and buttocks are killing me. Having a lot of pain and limitation w/ spasms Pt accompanied by: self  PERTINENT HISTORY: uterine cancer, endometrial cancer, hernia, PD, low back pain, OA  PAIN:  Are you having pain? Yes: NPRS scale: 4-8/10 Pain location: coccyx, lumbar spine Pain description: ache Aggravating factors: walking aggravates pain Relieving factors: sitting, lie on heating pad  PRECAUTIONS: Fall, orthostatic hypotension, Coccydynia with associated sacral discomfort, lumbar spondylosis   RED FLAGS: None   WEIGHT BEARING RESTRICTIONS: No  FALLS: Has patient fallen in last 6 months? Yes. Number of falls 2   LIVING ENVIRONMENT: Lives with: lives with their family and lives with their son Lives in: House/apartment Stairs: 2 steps from garage with rails Has following equipment at home: Environmental consultant - 4 wheeled and Wheelchair (manual)  PLOF: Independent  PATIENT GOALS: To be able to get out of bed by myself (it currently takes 5-10 minutes); to get up off the sofa; to get dressed, to be able to walk better  OBJECTIVE:   TODAY'S TREATMENT: 02/23/24 Activity Comments  TENS unit trial Demo and  set-up to TENS unit to apply to lumbar/glute to decrease pain  Gait training -w/ 5TT and supervision (wearing TENS unit) verbal cues in stride length. Metronome 90-95 bpm to increase velocity and occasional cues in stride length x 6 min  Bed mobility -demo techniques for placement of w/c for arm rest to act as assist rail for rolling supine to sidelying             Note: Objective measures were completed at Evaluation unless otherwise noted.  DIAGNOSTIC FINDINGS: No recent images for this episode  COGNITION: Overall cognitive status: Within functional limits for tasks assessed    POSTURE: rounded shoulders, forward head, and posterior pelvic tilt  PALPATION:  Tenderness to palpation along paraspinals lumbar>thoracic spine  LOWER EXTREMITY ROM:     Active  Right Eval Left Eval  Hip flexion    Hip extension    Hip abduction    Hip adduction    Hip internal rotation    Hip external rotation    Knee flexion    Knee extension    Ankle dorsiflexion neutral 5 degrees  Ankle plantarflexion    Ankle inversion    Ankle eversion     (Blank rows = not tested)  LOWER EXTREMITY MMT:    MMT Right Eval Left Eval  Hip flexion 4 4  Hip extension    Hip abduction    Hip adduction    Hip internal rotation    Hip external rotation    Knee flexion 4 4  Knee extension 4 4  Ankle dorsiflexion 3+ 3+  Ankle plantarflexion    Ankle inversion    Ankle eversion    (Blank rows = not tested)  VITALS:   131/89 HR 84 in sitting   105/75 HR 87 in sitting    TRANSFERS: Sit to stand: SBA  Assistive device utilized: UE support at chair rails     Stand to sit: SBA  Assistive device utilized: UE support      GAIT: Findings: Gait Characteristics: step through pattern, decreased arm swing- Right, decreased arm swing- Left, decreased step length- Right, decreased step length- Left, shuffling, festinating, and trunk flexed, Distance walked: 50 ft, Assistive device utilized:None, Level of  assistance: SBA, and Comments: Fast pace, shuffling steps; gait is limited due to low back and coccyx pain, per pt report  FUNCTIONAL TESTS:  Timed up  and go (TUG): 15.25 sec 10 meter walk test: 11.03 sec (2.97 ft/sec) TUG cognitive:  20.12 sec 3 M walk back:  attempted-only able to go 2 ft in 13.13 sec with hastening, decreased foot clearance 360 R:  11.46 sec in 22 steps 360 L:  11.25 sec in 23 steps                                                                                                                               TREATMENT DATE: 02/10/2024    PATIENT EDUCATION: Education details: Eval results, POC Person educated: Patient Education method: Explanation Education comprehension: verbalized understanding  HOME EXERCISE PROGRAM: Not yet initiated  GOALS: Goals reviewed with patient? Yes  SHORT TERM GOALS: Target date: 03/11/2024  Pt will be independent with HEP for improved back pain, posture, balance, gait. Baseline: Goal status: INITIAL  2.  Pt will improve TUG score to less than or equal to 13 sec for decreased fall risk. Baseline: >15 sec Goal status: INITIAL  3.  Pt will perform 360 turns R and L in 15 steps or less, for improved balance. Baseline: 22-23 steps Goal status: INITIAL   4.  Pt will report at least 50% improvement in bed mobility.  Baseline  Goal status:  INITIAL  LONG TERM GOALS: Target date: 04/08/2024  Pt will be independent with HEP for improved balance, gait. Baseline:  Goal status: INITIAL  2.  Pt will improve TUG cognitive score to less than or equal to 15 sec for decreased fall risk.  Baseline: 20 sec Goal status: INITIAL  3.  Pt will perform 3 M backwards walk test in less than or equal to 15 seconds for improved balance. Baseline: unable to complete Goal status: INITIAL  4.  Pt will verbalize understanding of fall prevention in the home environment. Baseline:  Goal status: INITIAL  5.  Pt will report at least 50% reduction in  low back pain, to improve overall functional mobility Baseline: 4-8/10 Goal status: INITIAL  ASSESSMENT:  CLINICAL IMPRESSION: Pt notes ongoing back/coccyx pain and inquires about TENS unit. Demo of unit and application of pads requiring assistance to place pads due to limited hand dexterity/shoulder ROM. TENS running x 20 min while performing mobility and reports decreased muscular pain with stimulation. Gait training w/ [phone] to improve safety and stability for turning and auditory strategies for gait speed on level surfaces at supervision level w/ good self-correction to sequence steps to metronome. Difficulty with bed mobility and reports instances of freezing in bed with difficulty initiating movement. Continued sessions to address deficits.   OBJECTIVE IMPAIRMENTS: Abnormal gait, decreased balance, decreased mobility, difficulty walking, decreased strength, increased muscle spasms, postural dysfunction, and pain.   ACTIVITY LIMITATIONS: bending, sitting, standing, transfers, bed mobility, hygiene/grooming, and locomotion level  PARTICIPATION LIMITATIONS: cleaning, laundry, shopping, community activity, and exercise  PERSONAL FACTORS: 3+ comorbidities: see above are also affecting patient's functional outcome.   REHAB POTENTIAL: Good  CLINICAL DECISION MAKING: Evolving/moderate complexity  EVALUATION COMPLEXITY: Moderate  PLAN:  PT FREQUENCY: 1x/week (per patient request)  PT DURATION: 8 weeks plus eval  PLANNED INTERVENTIONS: 97750- Physical Performance Testing, 97110-Therapeutic exercises, 97530- Therapeutic activity, 97112- Neuromuscular re-education, 97535- Self Care, 02859- Manual therapy, [phone]- Gait training, Patient/Family education, and Balance training  PLAN FOR NEXT SESSION: Initiate HEP, work on bed mobility, posture, back exercises, gait and balance exercise   Jonette MARLA Sandifer, PT 02/23/2024, 11:54 AM  [facility] at [facility] [address] [place], [place] [address] Phone # [phone] Fax # [phone]

## 2024-02-29 DIAGNOSIS — G20A1 Parkinson's disease without dyskinesia, without mention of fluctuations: Secondary | ICD-10-CM | POA: Diagnosis not present

## 2024-02-29 DIAGNOSIS — M5440 Lumbago with sciatica, unspecified side: Secondary | ICD-10-CM | POA: Diagnosis not present

## 2024-03-01 ENCOUNTER — Ambulatory Visit: Admitting: Physical Therapy

## 2024-03-01 ENCOUNTER — Encounter: Payer: Self-pay | Admitting: Physical Therapy

## 2024-03-01 DIAGNOSIS — R269 Unspecified abnormalities of gait and mobility: Secondary | ICD-10-CM

## 2024-03-01 DIAGNOSIS — R29818 Other symptoms and signs involving the nervous system: Secondary | ICD-10-CM

## 2024-03-01 DIAGNOSIS — G20A1 Parkinson's disease without dyskinesia, without mention of fluctuations: Secondary | ICD-10-CM | POA: Diagnosis not present

## 2024-03-01 DIAGNOSIS — R2681 Unsteadiness on feet: Secondary | ICD-10-CM

## 2024-03-01 DIAGNOSIS — M6281 Muscle weakness (generalized): Secondary | ICD-10-CM

## 2024-03-01 NOTE — Therapy (Signed)
 OUTPATIENT PHYSICAL THERAPY NEURO TREATMENT   Patient Name: Ashley Neal MRN: [id number] DOB:02/13/1946, 78 y.o., female Today's Date: 03/02/2024   PCP: Caro Harlene POUR, NP  REFERRING PROVIDER: Caro Harlene POUR, NP   END OF SESSION:  PT End of Session - 03/01/24 1319     Visit Number 3    Number of Visits 9    Date for PT Re-Evaluation 04/08/24    Authorization Type [facility]    PT Start Time 1319    PT Stop Time 1400    PT Time Calculation (min) 41 min    Activity Tolerance Patient tolerated treatment well    Behavior During Therapy WFL for tasks assessed/performed           Past Medical History:  Diagnosis Date   Anxiety    Basal cell carcinoma    Depression    Hypertension    Neuromuscular disorder (HCC)    Osteoarthritis    Osteopenia    Parkinson disease (HCC)    Pneumonia    Tremors of nervous system    legs   uterine ca 10/08/2021   also endometrial cancer   Past Surgical History:  Procedure Laterality Date   CARPAL TUNNEL RELEASE Bilateral 1996/1997   CYSTOSCOPY N/A 10/08/2021   Procedure: CYSTOSCOPY;  Surgeon: Viktoria Comer SAUNDERS, MD;  Location: [facility];  Service: Gynecology;  Laterality: N/A;   ROBOTIC ASSISTED TOTAL HYSTERECTOMY WITH BILATERAL SALPINGO OOPHERECTOMY N/A 10/08/2021   Procedure: XI ROBOTIC ASSISTED TOTAL HYSTERECTOMY WITH BILATERAL SALPINGO OOPHORECTOMY;SENTINEL LYMPH NODE INJECTION;  Surgeon: Viktoria Comer SAUNDERS, MD;  Location: [facility];  Service: Gynecology;  Laterality: N/A;   SQUAMOUS CELL CARCINOMA EXCISION Left 2017   Left jawline   TUBAL LIGATION  1980's   Patient Active Problem List   Diagnosis Date Noted   Lumbar spondylosis 12/23/2023   Coccydynia 12/23/2023   Endometrial cancer (HCC) 10/16/2021   Complex atypical endometrial hyperplasia 09/27/2021   Primary osteoarthritis of both hands 05/14/2020   Primary osteoarthritis of both feet 05/14/2020   Age-related osteoporosis without current pathological fracture  05/14/2020   Prediabetes 05/14/2020   Essential hypertension 05/14/2020   History of hyperlipidemia 05/14/2020   History of squamous cell carcinoma 05/14/2020   Parkinsonism (HCC) 05/14/2020    ONSET DATE: PD screen 01/08/24  REFERRING DIAG: G20.A1 (ICD-10-CM) - Parkinson disease, symptomatic (HCC)   THERAPY DIAG:  Unsteadiness on feet  Muscle weakness (generalized)  Abnormality of gait and mobility  Other symptoms and signs involving the nervous system  Rationale for Evaluation and Treatment: Rehabilitation  SUBJECTIVE:  SUBJECTIVE STATEMENT: Just tired of fighting the pain of this back.  Got a TENS unit and not sure how I will put on the pads on my own. Pt accompanied by: self  PERTINENT HISTORY: uterine cancer, endometrial cancer, hernia, PD, low back pain, OA  PAIN:  Are you having pain? Yes: NPRS scale: 7/10 Pain location: coccyx, lumbar spine Pain description: ache Aggravating factors: walking aggravates pain Relieving factors: sitting, lie on heating pad  PRECAUTIONS: Fall, orthostatic hypotension, Coccydynia with associated sacral discomfort, lumbar spondylosis   RED FLAGS: None   WEIGHT BEARING RESTRICTIONS: No  FALLS: Has patient fallen in last 6 months? Yes. Number of falls 2   LIVING ENVIRONMENT: Lives with: lives with their family and lives with their son Lives in: House/apartment Stairs: 2 steps from garage with rails Has following equipment at home: Environmental consultant - 4 wheeled and Wheelchair (manual)  PLOF: Independent  PATIENT GOALS: To be able to get out of bed by myself (it currently takes 5-10 minutes); to get up off the sofa; to get dressed, to be able to walk better  OBJECTIVE:   TODAY'S TREATMENT: 03/01/2024 THERAPEUTIC ACTIVITY: Pt brought in her new TENS  Unit-problem solved her unit for pain relief to low back and buttocks area Educated in pain science in relation to TENS and answered pt's questions Educated/worked with patient to find appropriate settings on her TENS unit; trial of kneading, deep tissue, and tapping settings-finding appropriate intensity, time, mode buttons and how to lock machine to avoid changing settings unplanned. PT assisted pt to place 2 electrodes at low back musculature; pt able to take them off independently.  Worked to [employer] that she may need son to assist don/doff electrodes.  Discussed that electrodes should be placed over muscles, not over bony prominences or joint space.    THERAPEUTIC EXERCISE: Seated lumbar stretch forward/back with green therapy ball, 5 reps, 5-10 sec-pt reports good relief, good stretch Attempted seated lumbar stretch laterally and pt reports discomfort Seated Lateral lean/reach R and L, 3 reps-good form, no discomfort-pt reports these are like LSVT BIG moves she already does at home Discussed use of supine trunk rotation, SKTC and pt reports she already does these at home  HOME EXERCISE PROGRAM: Access Code: [email or web address] URL: [email or web address] Date: 03/01/2024 Prepared by: [facility] - Outpatient  Rehab - [facility]  Exercises - Seated Flexion Stretch with Swiss Ball  - 1 x daily - 7 x weekly - 1 sets - 5-10 reps  PATIENT EDUCATION: Education details: HEP, TENS unit education-see above Person educated: Patient Education method: Explanation, Demonstration, and Handouts Education comprehension: verbalized understanding, returned demonstration, and needs further education  -------------------------------------------------------------- Note: Objective measures were completed at Evaluation unless otherwise noted.  DIAGNOSTIC FINDINGS: No recent images for this episode  COGNITION: Overall cognitive status: Within functional limits for tasks  assessed    POSTURE: rounded shoulders, forward head, and posterior pelvic tilt  PALPATION:  Tenderness to palpation along paraspinals lumbar>thoracic spine  LOWER EXTREMITY ROM:     Active  Right Eval Left Eval  Hip flexion    Hip extension    Hip abduction    Hip adduction    Hip internal rotation    Hip external rotation    Knee flexion    Knee extension    Ankle dorsiflexion neutral 5 degrees  Ankle plantarflexion    Ankle inversion    Ankle eversion     (Blank rows = not tested)  LOWER EXTREMITY MMT:  MMT Right Eval Left Eval  Hip flexion 4 4  Hip extension    Hip abduction    Hip adduction    Hip internal rotation    Hip external rotation    Knee flexion 4 4  Knee extension 4 4  Ankle dorsiflexion 3+ 3+  Ankle plantarflexion    Ankle inversion    Ankle eversion    (Blank rows = not tested)  VITALS:   131/89 HR 84 in sitting   105/75 HR 87 in sitting    TRANSFERS: Sit to stand: SBA  Assistive device utilized: UE support at chair rails     Stand to sit: SBA  Assistive device utilized: UE support      GAIT: Findings: Gait Characteristics: step through pattern, decreased arm swing- Right, decreased arm swing- Left, decreased step length- Right, decreased step length- Left, shuffling, festinating, and trunk flexed, Distance walked: 50 ft, Assistive device utilized:None, Level of assistance: SBA, and Comments: Fast pace, shuffling steps; gait is limited due to low back and coccyx pain, per pt report  FUNCTIONAL TESTS:  Timed up and go (TUG): 15.25 sec 10 meter walk test: 11.03 sec (2.97 ft/sec) TUG cognitive:  20.12 sec 3 M walk back:  attempted-only able to go 2 ft in 13.13 sec with hastening, decreased foot clearance 360 R:  11.46 sec in 22 steps 360 L:  11.25 sec in 23 steps                                                                                                                               TREATMENT DATE: 02/10/2024    PATIENT  EDUCATION: Education details: Eval results, POC Person educated: Patient Education method: Explanation Education comprehension: verbalized understanding  HOME EXERCISE PROGRAM: Not yet initiated  GOALS: Goals reviewed with patient? Yes  SHORT TERM GOALS: Target date: 03/11/2024  Pt will be independent with HEP for improved back pain, posture, balance, gait. Baseline: Goal status: IN PROGRESS  2.  Pt will improve TUG score to less than or equal to 13 sec for decreased fall risk. Baseline: >15 sec Goal status: IN PROGRESS  3.  Pt will perform 360 turns R and L in 15 steps or less, for improved balance. Baseline: 22-23 steps Goal status: IN PROGRESS   4.  Pt will report at least 50% improvement in bed mobility.  Baseline  Goal status: IN PROGRESS  LONG TERM GOALS: Target date: 04/08/2024  Pt will be independent with HEP for improved balance, gait. Baseline:  Goal status: IN PROGRESS  2.  Pt will improve TUG cognitive score to less than or equal to 15 sec for decreased fall risk.  Baseline: 20 sec Goal status: IN PROGRESS  3.  Pt will perform 3 M backwards walk test in less than or equal to 15 seconds for improved balance. Baseline: unable to complete Goal status: IN PROGRESS  4.  Pt will verbalize understanding of fall prevention in  the home environment. Baseline:  Goal status: IN PROGRESS  5.  Pt will report at least 50% reduction in low back pain, to improve overall functional mobility Baseline: 4-8/10 Goal status: IN PROGRESS  ASSESSMENT:  CLINICAL IMPRESSION: Pt presents today with continued reports of increased low back/buttocks pain.  She brings in her own (new-in the box) TENS Unit and requests PT to assist to get her set up with it.  Majority of session focused on education of TENS unit parameters and use; pt verbalize/demo understanding and reports some relief after TENS removed. Skilled PT session also focused on low back stretch exercises and HEP initiated  for this, for pt to perform in addition stretches and LSVT BIG exercises she is already performing at home.  Pt will continue to benefit from skilled PT towards goals for improved functional mobility and decreased fall risk.   OBJECTIVE IMPAIRMENTS: Abnormal gait, decreased balance, decreased mobility, difficulty walking, decreased strength, increased muscle spasms, postural dysfunction, and pain.   ACTIVITY LIMITATIONS: bending, sitting, standing, transfers, bed mobility, hygiene/grooming, and locomotion level  PARTICIPATION LIMITATIONS: cleaning, laundry, shopping, community activity, and exercise  PERSONAL FACTORS: 3+ comorbidities: see above are also affecting patient's functional outcome.   REHAB POTENTIAL: Good  CLINICAL DECISION MAKING: Evolving/moderate complexity  EVALUATION COMPLEXITY: Moderate  PLAN:  PT FREQUENCY: 1x/week (per patient request)  PT DURATION: 8 weeks plus eval  PLANNED INTERVENTIONS: 97750- Physical Performance Testing, 97110-Therapeutic exercises, 97530- Therapeutic activity, 97112- Neuromuscular re-education, 97535- Self Care, 02859- Manual therapy, [phone]- Gait training, Patient/Family education, and Balance training  PLAN FOR NEXT SESSION: Review HEP, ask about how TENS unit worked; work on bed mobility, posture, back exercises, gait and balance exercise   Stefanny Pieri W., PT 03/02/2024, 7:56 AM  [facility] at [facility] [address] [place], [place] [address] Phone # [phone] Fax # [phone]

## 2024-03-02 ENCOUNTER — Encounter: Admitting: Physical Medicine & Rehabilitation

## 2024-03-08 ENCOUNTER — Ambulatory Visit: Admitting: Occupational Therapy

## 2024-03-08 ENCOUNTER — Ambulatory Visit

## 2024-03-08 DIAGNOSIS — G20A1 Parkinson's disease without dyskinesia, without mention of fluctuations: Secondary | ICD-10-CM | POA: Diagnosis not present

## 2024-03-08 DIAGNOSIS — R2681 Unsteadiness on feet: Secondary | ICD-10-CM

## 2024-03-08 DIAGNOSIS — R278 Other lack of coordination: Secondary | ICD-10-CM

## 2024-03-08 DIAGNOSIS — M6281 Muscle weakness (generalized): Secondary | ICD-10-CM

## 2024-03-08 DIAGNOSIS — R269 Unspecified abnormalities of gait and mobility: Secondary | ICD-10-CM

## 2024-03-08 DIAGNOSIS — R29818 Other symptoms and signs involving the nervous system: Secondary | ICD-10-CM

## 2024-03-08 NOTE — Therapy (Signed)
 OUTPATIENT PHYSICAL THERAPY NEURO TREATMENT   Patient Name: Ashley Neal MRN: [id number] DOB:[birth year], 78 y.o., female Today's Date: 03/08/2024   PCP: Caro Harlene POUR, NP  REFERRING PROVIDER: Caro Harlene POUR, NP   END OF SESSION:  PT End of Session - 03/08/24 1317     Visit Number 4    Number of Visits 9    Date for PT Re-Evaluation 04/08/24    Authorization Type [facility]    PT Start Time 1315    PT Stop Time 1400    PT Time Calculation (min) 45 min    Activity Tolerance Patient tolerated treatment well    Behavior During Therapy WFL for tasks assessed/performed           Past Medical History:  Diagnosis Date   Anxiety    Basal cell carcinoma    Depression    Hypertension    Neuromuscular disorder (HCC)    Osteoarthritis    Osteopenia    Parkinson disease (HCC)    Pneumonia    Tremors of nervous system    legs   uterine ca 10/08/2021   also endometrial cancer   Past Surgical History:  Procedure Laterality Date   CARPAL TUNNEL RELEASE Bilateral 1996/1997   CYSTOSCOPY N/A 10/08/2021   Procedure: CYSTOSCOPY;  Surgeon: Viktoria Comer SAUNDERS, MD;  Location: [facility];  Service: Gynecology;  Laterality: N/A;   ROBOTIC ASSISTED TOTAL HYSTERECTOMY WITH BILATERAL SALPINGO OOPHERECTOMY N/A 10/08/2021   Procedure: XI ROBOTIC ASSISTED TOTAL HYSTERECTOMY WITH BILATERAL SALPINGO OOPHORECTOMY;SENTINEL LYMPH NODE INJECTION;  Surgeon: Viktoria Comer SAUNDERS, MD;  Location: [facility];  Service: Gynecology;  Laterality: N/A;   SQUAMOUS CELL CARCINOMA EXCISION Left 2017   Left jawline   TUBAL LIGATION  1980's   Patient Active Problem List   Diagnosis Date Noted   Lumbar spondylosis 12/23/2023   Coccydynia 12/23/2023   Endometrial cancer (HCC) 10/16/2021   Complex atypical endometrial hyperplasia 09/27/2021   Primary osteoarthritis of both hands 05/14/2020   Primary osteoarthritis of both feet 05/14/2020   Age-related osteoporosis without current pathological fracture  05/14/2020   Prediabetes 05/14/2020   Essential hypertension 05/14/2020   History of hyperlipidemia 05/14/2020   History of squamous cell carcinoma 05/14/2020   Parkinsonism (HCC) 05/14/2020    ONSET DATE: PD screen 01/08/24  REFERRING DIAG: G20.A1 (ICD-10-CM) - Parkinson disease, symptomatic (HCC)   THERAPY DIAG:  Unsteadiness on feet  Muscle weakness (generalized)  Abnormality of gait and mobility  Other symptoms and signs involving the nervous system  Parkinson disease, symptomatic (HCC)  Rationale for Evaluation and Treatment: Rehabilitation  SUBJECTIVE:  SUBJECTIVE STATEMENT: Still having difficulty w/ TENS unit Pt accompanied by: self  PERTINENT HISTORY: uterine cancer, endometrial cancer, hernia, PD, low back pain, OA  PAIN:  Are you having pain? Yes: NPRS scale: 7/10 Pain location: coccyx, lumbar spine Pain description: ache Aggravating factors: walking aggravates pain Relieving factors: sitting, lie on heating pad  PRECAUTIONS: Fall, orthostatic hypotension, Coccydynia with associated sacral discomfort, lumbar spondylosis   RED FLAGS: None   WEIGHT BEARING RESTRICTIONS: No  FALLS: Has patient fallen in last 6 months? Yes. Number of falls 2   LIVING ENVIRONMENT: Lives with: lives with their family and lives with their son Lives in: House/apartment Stairs: 2 steps from garage with rails Has following equipment at home: Environmental consultant - 4 wheeled and Wheelchair (manual)  PLOF: Independent  PATIENT GOALS: To be able to get out of bed by myself (it currently takes 5-10 minutes); to get up off the sofa; to get dressed, to be able to walk better  OBJECTIVE:   TODAY'S TREATMENT: 03/08/24 Activity Comments  Self-care Training in TENS unit set-up for settings and placement   Cycling x 6 min Assist for cadence to 50 RPM for rapid alternating movement to prepare for gait  Gait training 4x2 min W/ U-step and metronome 90 bpm              TODAY'S TREATMENT: 03/01/2024 THERAPEUTIC ACTIVITY: Pt brought in her new TENS Unit-problem solved her unit for pain relief to low back and buttocks area Educated in pain science in relation to TENS and answered pt's questions Educated/worked with patient to find appropriate settings on her TENS unit; trial of kneading, deep tissue, and tapping settings-finding appropriate intensity, time, mode buttons and how to lock machine to avoid changing settings unplanned. PT assisted pt to place 2 electrodes at low back musculature; pt able to take them off independently.  Worked to [employer] that she may need son to assist don/doff electrodes.  Discussed that electrodes should be placed over muscles, not over bony prominences or joint space.    THERAPEUTIC EXERCISE: Seated lumbar stretch forward/back with green therapy ball, 5 reps, 5-10 sec-pt reports good relief, good stretch Attempted seated lumbar stretch laterally and pt reports discomfort Seated Lateral lean/reach R and L, 3 reps-good form, no discomfort-pt reports these are like LSVT BIG moves she already does at home Discussed use of supine trunk rotation, SKTC and pt reports she already does these at home  HOME EXERCISE PROGRAM: Access Code: [email or web address] URL: [email or web address] Date: 03/01/2024 Prepared by: [facility] - Outpatient  Rehab - [facility]  Exercises - Seated Flexion Stretch with Swiss Ball  - 1 x daily - 7 x weekly - 1 sets - 5-10 reps  PATIENT EDUCATION: Education details: HEP, TENS unit education-see above Person educated: Patient Education method: Explanation, Demonstration, and Handouts Education comprehension: verbalized understanding, returned demonstration, and needs further education   -------------------------------------------------------------- Note: Objective measures were completed at Evaluation unless otherwise noted.  DIAGNOSTIC FINDINGS: No recent images for this episode  COGNITION: Overall cognitive status: Within functional limits for tasks assessed    POSTURE: rounded shoulders, forward head, and posterior pelvic tilt  PALPATION:  Tenderness to palpation along paraspinals lumbar>thoracic spine  LOWER EXTREMITY ROM:     Active  Right Eval Left Eval  Hip flexion    Hip extension    Hip abduction    Hip adduction    Hip internal rotation    Hip external rotation    Knee flexion    Knee  extension    Ankle dorsiflexion neutral 5 degrees  Ankle plantarflexion    Ankle inversion    Ankle eversion     (Blank rows = not tested)  LOWER EXTREMITY MMT:    MMT Right Eval Left Eval  Hip flexion 4 4  Hip extension    Hip abduction    Hip adduction    Hip internal rotation    Hip external rotation    Knee flexion 4 4  Knee extension 4 4  Ankle dorsiflexion 3+ 3+  Ankle plantarflexion    Ankle inversion    Ankle eversion    (Blank rows = not tested)  VITALS:   131/89 HR 84 in sitting   105/75 HR 87 in sitting    TRANSFERS: Sit to stand: SBA  Assistive device utilized: UE support at chair rails     Stand to sit: SBA  Assistive device utilized: UE support      GAIT: Findings: Gait Characteristics: step through pattern, decreased arm swing- Right, decreased arm swing- Left, decreased step length- Right, decreased step length- Left, shuffling, festinating, and trunk flexed, Distance walked: 50 ft, Assistive device utilized:None, Level of assistance: SBA, and Comments: Fast pace, shuffling steps; gait is limited due to low back and coccyx pain, per pt report  FUNCTIONAL TESTS:  Timed up and go (TUG): 15.25 sec 10 meter walk test: 11.03 sec (2.97 ft/sec) TUG cognitive:  20.12 sec 3 M walk back:  attempted-only able to go 2 ft in 13.13 sec with  hastening, decreased foot clearance 360 R:  11.46 sec in 22 steps 360 L:  11.25 sec in 23 steps                                                                                                                               TREATMENT DATE: 02/10/2024    PATIENT EDUCATION: Education details: Eval results, POC Person educated: Patient Education method: Explanation Education comprehension: verbalized understanding  HOME EXERCISE PROGRAM: Not yet initiated  GOALS: Goals reviewed with patient? Yes  SHORT TERM GOALS: Target date: 03/11/2024  Pt will be independent with HEP for improved back pain, posture, balance, gait. Baseline: Goal status: IN PROGRESS  2.  Pt will improve TUG score to less than or equal to 13 sec for decreased fall risk. Baseline: >15 sec Goal status: IN PROGRESS  3.  Pt will perform 360 turns R and L in 15 steps or less, for improved balance. Baseline: 22-23 steps Goal status: IN PROGRESS   4.  Pt will report at least 50% improvement in bed mobility.  Baseline  Goal status: IN PROGRESS  LONG TERM GOALS: Target date: 04/08/2024  Pt will be independent with HEP for improved balance, gait. Baseline:  Goal status: IN PROGRESS  2.  Pt will improve TUG cognitive score to less than or equal to 15 sec for decreased fall risk.  Baseline: 20 sec Goal status: IN PROGRESS  3.  Pt will perform 3 M backwards walk test in less than or equal to 15 seconds for improved balance. Baseline: unable to complete Goal status: IN PROGRESS  4.  Pt will verbalize understanding of fall prevention in the home environment. Baseline:  Goal status: IN PROGRESS  5.  Pt will report at least 50% reduction in low back pain, to improve overall functional mobility Baseline: 4-8/10 Goal status: IN PROGRESS  ASSESSMENT:  CLINICAL IMPRESSION: Pt continuing to have difficulty wih TENS unit placement and function. Review of program and parameters and trials of placement for pads but  difficulty in reaching to posterior to place requiring assist. Demo good teach-back of setting to parameters after instruction. LE cycling w/ assistance to increase cadence for pre-gait to facilitate rapid alternating movement. Gait training w/ emphasis on increased speed w/ use of AD to promote and metronome for auditory facilitation for cadence with good maintenance of speed unless she engages in conversation which slows her to a near stop. Discussed avoiding dual-tasking during these times to enable more focus and maintenance for speed.   OBJECTIVE IMPAIRMENTS: Abnormal gait, decreased balance, decreased mobility, difficulty walking, decreased strength, increased muscle spasms, postural dysfunction, and pain.   ACTIVITY LIMITATIONS: bending, sitting, standing, transfers, bed mobility, hygiene/grooming, and locomotion level  PARTICIPATION LIMITATIONS: cleaning, laundry, shopping, community activity, and exercise  PERSONAL FACTORS: 3+ comorbidities: see above are also affecting patient's functional outcome.   REHAB POTENTIAL: Good  CLINICAL DECISION MAKING: Evolving/moderate complexity  EVALUATION COMPLEXITY: Moderate  PLAN:  PT FREQUENCY: 1x/week (per patient request)  PT DURATION: 8 weeks plus eval  PLANNED INTERVENTIONS: 97750- Physical Performance Testing, 97110-Therapeutic exercises, 97530- Therapeutic activity, 97112- Neuromuscular re-education, 97535- Self Care, 02859- Manual therapy, [phone]- Gait training, Patient/Family education, and Balance training  PLAN FOR NEXT SESSION: Review HEP, ask about how TENS unit worked; work on bed mobility, posture, back exercises, gait and balance exercise   Jonette MARLA Sandifer, PT 03/08/2024, 1:18 PM  [facility] Rehab at [facility] [address] [place], [place] [address] Phone # [phone] Fax # [phone]
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
## 2024-03-14 ENCOUNTER — Ambulatory Visit: Attending: Nurse Practitioner | Admitting: Occupational Therapy

## 2024-03-14 ENCOUNTER — Ambulatory Visit

## 2024-03-14 DIAGNOSIS — R29898 Other symptoms and signs involving the musculoskeletal system: Secondary | ICD-10-CM | POA: Insufficient documentation

## 2024-03-14 DIAGNOSIS — R269 Unspecified abnormalities of gait and mobility: Secondary | ICD-10-CM

## 2024-03-14 DIAGNOSIS — R29818 Other symptoms and signs involving the nervous system: Secondary | ICD-10-CM | POA: Diagnosis not present

## 2024-03-14 DIAGNOSIS — M6281 Muscle weakness (generalized): Secondary | ICD-10-CM | POA: Insufficient documentation

## 2024-03-14 DIAGNOSIS — G20A1 Parkinson's disease without dyskinesia, without mention of fluctuations: Secondary | ICD-10-CM

## 2024-03-14 DIAGNOSIS — R2681 Unsteadiness on feet: Secondary | ICD-10-CM | POA: Insufficient documentation

## 2024-03-14 DIAGNOSIS — M5459 Other low back pain: Secondary | ICD-10-CM | POA: Insufficient documentation

## 2024-03-14 DIAGNOSIS — R278 Other lack of coordination: Secondary | ICD-10-CM | POA: Insufficient documentation
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
## 2024-03-14 NOTE — Therapy (Signed)
 OUTPATIENT PHYSICAL THERAPY NEURO TREATMENT   Patient Name: Ashley Neal MRN: [id number] DOB:11-Jun-1946, 78 y.o., female Today's Date: 03/14/2024   PCP: Caro Harlene POUR, NP  REFERRING PROVIDER: Caro Harlene POUR, NP   END OF SESSION:  PT End of Session - 03/14/24 1426     Visit Number 5    Number of Visits 9    Date for PT Re-Evaluation 04/08/24    Authorization Type [facility]    PT Start Time 1422   late arrival   PT Stop Time 1450    PT Time Calculation (min) 28 min    Activity Tolerance Patient tolerated treatment well    Behavior During Therapy WFL for tasks assessed/performed           Past Medical History:  Diagnosis Date   Anxiety    Basal cell carcinoma    Depression    Hypertension    Neuromuscular disorder (HCC)    Osteoarthritis    Osteopenia    Parkinson disease (HCC)    Pneumonia    Tremors of nervous system    legs   uterine ca 10/08/2021   also endometrial cancer   Past Surgical History:  Procedure Laterality Date   CARPAL TUNNEL RELEASE Bilateral 1996/1997   CYSTOSCOPY N/A 10/08/2021   Procedure: CYSTOSCOPY;  Surgeon: Viktoria Comer SAUNDERS, MD;  Location: [facility];  Service: Gynecology;  Laterality: N/A;   ROBOTIC ASSISTED TOTAL HYSTERECTOMY WITH BILATERAL SALPINGO OOPHERECTOMY N/A 10/08/2021   Procedure: XI ROBOTIC ASSISTED TOTAL HYSTERECTOMY WITH BILATERAL SALPINGO OOPHORECTOMY;SENTINEL LYMPH NODE INJECTION;  Surgeon: Viktoria Comer SAUNDERS, MD;  Location: [facility];  Service: Gynecology;  Laterality: N/A;   SQUAMOUS CELL CARCINOMA EXCISION Left 2017   Left jawline   TUBAL LIGATION  1980's   Patient Active Problem List   Diagnosis Date Noted   Lumbar spondylosis 12/23/2023   Coccydynia 12/23/2023   Endometrial cancer (HCC) 10/16/2021   Complex atypical endometrial hyperplasia 09/27/2021   Primary osteoarthritis of both hands 05/14/2020   Primary osteoarthritis of both feet 05/14/2020   Age-related osteoporosis without current  pathological fracture 05/14/2020   Prediabetes 05/14/2020   Essential hypertension 05/14/2020   History of hyperlipidemia 05/14/2020   History of squamous cell carcinoma 05/14/2020   Parkinsonism (HCC) 05/14/2020    ONSET DATE: PD screen 01/08/24  REFERRING DIAG: G20.A1 (ICD-10-CM) - Parkinson disease, symptomatic (HCC)   THERAPY DIAG:  Unsteadiness on feet  Muscle weakness (generalized)  Abnormality of gait and mobility  Parkinson disease, symptomatic (HCC)  Rationale for Evaluation and Treatment: Rehabilitation  SUBJECTIVE:  SUBJECTIVE STATEMENT: Having a rough day with symptoms. Having a back ache, every day for 3 years. Pt accompanied by: self  PERTINENT HISTORY: uterine cancer, endometrial cancer, hernia, PD, low back pain, OA  PAIN:  Are you having pain? Yes: NPRS scale: 7/10 Pain location: coccyx, lumbar spine Pain description: ache Aggravating factors: walking aggravates pain Relieving factors: sitting, lie on heating pad  PRECAUTIONS: Fall, orthostatic hypotension, Coccydynia with associated sacral discomfort, lumbar spondylosis   RED FLAGS: None   WEIGHT BEARING RESTRICTIONS: No  FALLS: Has patient fallen in last 6 months? Yes. Number of falls 2   LIVING ENVIRONMENT: Lives with: lives with their family and lives with their son Lives in: House/apartment Stairs: 2 steps from garage with rails Has following equipment at home: Environmental consultant - 4 wheeled and Wheelchair (manual)  PLOF: Independent  PATIENT GOALS: To be able to get out of bed by myself (it currently takes 5-10 minutes); to get up off the sofa; to get dressed, to be able to walk better  OBJECTIVE:   TODAY'S TREATMENT: 03/14/24 Activity Comments  NU-step speed intervals, LE only x 7 min 2 min warm-up 30 sec speed,  30 sec recovery  Standing PWR moves 1x10 W/ physioball behind back on wall to help support trunk extension  Seated physioball rollout    Pt education Regarding area agency on aging and community resources           TODAY'S TREATMENT: 03/08/24 Activity Comments  Self-care Training in TENS unit set-up for settings and placement  Cycling x 6 min Assist for cadence to 50 RPM for rapid alternating movement to prepare for gait  Gait training 4x2 min W/ U-step and metronome 90 bpm              TODAY'S TREATMENT: 03/01/2024 THERAPEUTIC ACTIVITY: Pt brought in her new TENS Unit-problem solved her unit for pain relief to low back and buttocks area Educated in pain science in relation to TENS and answered pt's questions Educated/worked with patient to find appropriate settings on her TENS unit; trial of kneading, deep tissue, and tapping settings-finding appropriate intensity, time, mode buttons and how to lock machine to avoid changing settings unplanned. PT assisted pt to place 2 electrodes at low back musculature; pt able to take them off independently.  Worked to [employer] that she may need son to assist don/doff electrodes.  Discussed that electrodes should be placed over muscles, not over bony prominences or joint space.    THERAPEUTIC EXERCISE: Seated lumbar stretch forward/back with green therapy ball, 5 reps, 5-10 sec-pt reports good relief, good stretch Attempted seated lumbar stretch laterally and pt reports discomfort Seated Lateral lean/reach R and L, 3 reps-good form, no discomfort-pt reports these are like LSVT BIG moves she already does at home Discussed use of supine trunk rotation, SKTC and pt reports she already does these at home  HOME EXERCISE PROGRAM: Access Code: [email or web address] URL: [email or web address] Date: 03/01/2024 Prepared by: [facility] - Outpatient  Rehab - [facility]  Exercises - Seated Flexion Stretch with Swiss Ball  - 1 x daily - 7  x weekly - 1 sets - 5-10 reps  PATIENT EDUCATION: Education details: HEP, TENS unit education-see above Person educated: Patient Education method: Explanation, Demonstration, and Handouts Education comprehension: verbalized understanding, returned demonstration, and needs further education  -------------------------------------------------------------- Note: Objective measures were completed at Evaluation unless otherwise noted.  DIAGNOSTIC FINDINGS: No recent images for this episode  COGNITION: Overall cognitive status: Within functional limits for tasks assessed  POSTURE: rounded shoulders, forward head, and posterior pelvic tilt  PALPATION:  Tenderness to palpation along paraspinals lumbar>thoracic spine  LOWER EXTREMITY ROM:     Active  Right Eval Left Eval  Hip flexion    Hip extension    Hip abduction    Hip adduction    Hip internal rotation    Hip external rotation    Knee flexion    Knee extension    Ankle dorsiflexion neutral 5 degrees  Ankle plantarflexion    Ankle inversion    Ankle eversion     (Blank rows = not tested)  LOWER EXTREMITY MMT:    MMT Right Eval Left Eval  Hip flexion 4 4  Hip extension    Hip abduction    Hip adduction    Hip internal rotation    Hip external rotation    Knee flexion 4 4  Knee extension 4 4  Ankle dorsiflexion 3+ 3+  Ankle plantarflexion    Ankle inversion    Ankle eversion    (Blank rows = not tested)  VITALS:   131/89 HR 84 in sitting   105/75 HR 87 in sitting    TRANSFERS: Sit to stand: SBA  Assistive device utilized: UE support at chair rails     Stand to sit: SBA  Assistive device utilized: UE support      GAIT: Findings: Gait Characteristics: step through pattern, decreased arm swing- Right, decreased arm swing- Left, decreased step length- Right, decreased step length- Left, shuffling, festinating, and trunk flexed, Distance walked: 50 ft, Assistive device utilized:None, Level of assistance: SBA,  and Comments: Fast pace, shuffling steps; gait is limited due to low back and coccyx pain, per pt report  FUNCTIONAL TESTS:  Timed up and go (TUG): 15.25 sec 10 meter walk test: 11.03 sec (2.97 ft/sec) TUG cognitive:  20.12 sec 3 M walk back:  attempted-only able to go 2 ft in 13.13 sec with hastening, decreased foot clearance 360 R:  11.46 sec in 22 steps 360 L:  11.25 sec in 23 steps                                                                                                                               TREATMENT DATE: 02/10/2024    PATIENT EDUCATION: Education details: Eval results, POC Person educated: Patient Education method: Explanation Education comprehension: verbalized understanding  HOME EXERCISE PROGRAM: Not yet initiated  GOALS: Goals reviewed with patient? Yes  SHORT TERM GOALS: Target date: 03/11/2024  Pt will be independent with HEP for improved back pain, posture, balance, gait. Baseline: Goal status: IN PROGRESS  2.  Pt will improve TUG score to less than or equal to 13 sec for decreased fall risk. Baseline: >15 sec Goal status: IN PROGRESS  3.  Pt will perform 360 turns R and L in 15 steps or less, for improved balance. Baseline: 22-23 steps Goal status: IN PROGRESS   4.  Pt  will report at least 50% improvement in bed mobility.  Baseline  Goal status: IN PROGRESS  LONG TERM GOALS: Target date: 04/08/2024  Pt will be independent with HEP for improved balance, gait. Baseline:  Goal status: IN PROGRESS  2.  Pt will improve TUG cognitive score to less than or equal to 15 sec for decreased fall risk.  Baseline: 20 sec Goal status: IN PROGRESS  3.  Pt will perform 3 M backwards walk test in less than or equal to 15 seconds for improved balance. Baseline: unable to complete Goal status: IN PROGRESS  4.  Pt will verbalize understanding of fall prevention in the home environment. Baseline:  Goal status: IN PROGRESS  5.  Pt will report at least  50% reduction in low back pain, to improve overall functional mobility Baseline: 4-8/10 Goal status: IN PROGRESS  ASSESSMENT:  CLINICAL IMPRESSION: Instructed in seated and standing coordination activities to increase amplitude and fluidity for weight shifting and change of direction.  Pt education regarding local resources for older adults and PD resources.   OBJECTIVE IMPAIRMENTS: Abnormal gait, decreased balance, decreased mobility, difficulty walking, decreased strength, increased muscle spasms, postural dysfunction, and pain.   ACTIVITY LIMITATIONS: bending, sitting, standing, transfers, bed mobility, hygiene/grooming, and locomotion level  PARTICIPATION LIMITATIONS: cleaning, laundry, shopping, community activity, and exercise  PERSONAL FACTORS: 3+ comorbidities: see above are also affecting patient's functional outcome.   REHAB POTENTIAL: Good  CLINICAL DECISION MAKING: Evolving/moderate complexity  EVALUATION COMPLEXITY: Moderate  PLAN:  PT FREQUENCY: 1x/week (per patient request)  PT DURATION: 8 weeks plus eval  PLANNED INTERVENTIONS: 97750- Physical Performance Testing, 97110-Therapeutic exercises, 97530- Therapeutic activity, 97112- Neuromuscular re-education, 97535- Self Care, [id number]- Manual therapy, [phone]- Gait training, Patient/Family education, and Balance training  PLAN FOR NEXT SESSION: Review HEP, ask about how TENS unit worked; work on bed mobility, posture, back exercises, gait and balance exercise   Jonette MARLA Sandifer, PT 03/14/2024, 2:52 PM  [facility] at [facility] [address], [address] [place], [place] [address] Phone # [phone] Fax # [phone]

## 2024-03-15 ENCOUNTER — Ambulatory Visit (HOSPITAL_BASED_OUTPATIENT_CLINIC_OR_DEPARTMENT_OTHER): Attending: Nurse Practitioner | Admitting: Physical Therapy

## 2024-03-15 DIAGNOSIS — M533 Sacrococcygeal disorders, not elsewhere classified: Secondary | ICD-10-CM | POA: Diagnosis not present

## 2024-03-15 DIAGNOSIS — M47816 Spondylosis without myelopathy or radiculopathy, lumbar region: Secondary | ICD-10-CM | POA: Diagnosis not present

## 2024-03-15 DIAGNOSIS — M6281 Muscle weakness (generalized): Secondary | ICD-10-CM | POA: Diagnosis not present

## 2024-03-15 DIAGNOSIS — R269 Unspecified abnormalities of gait and mobility: Secondary | ICD-10-CM | POA: Insufficient documentation

## 2024-03-15 DIAGNOSIS — M5459 Other low back pain: Secondary | ICD-10-CM | POA: Insufficient documentation

## 2024-03-15 DIAGNOSIS — R29898 Other symptoms and signs involving the musculoskeletal system: Secondary | ICD-10-CM | POA: Insufficient documentation

## 2024-03-15 NOTE — Therapy (Signed)
 OUTPATIENT PHYSICAL THERAPY THORACOLUMBAR EVALUATION   Patient Name: Ashley Neal MRN: [id number] DOB:26-Aug-1945, 78 y.o., female Today's Date: 03/16/2024  END OF SESSION:  PT End of Session - 03/15/24 1421     Visit Number 6   Visit #1 for lumbar PT   Number of Visits 9   13 for lumbar PT   Date for PT Re-Evaluation 04/08/24   05/10/2024 for lumbar   Authorization Type [facility]    PT Start Time 1319    PT Stop Time 1411    PT Time Calculation (min) 52 min    Activity Tolerance Patient tolerated treatment well    Behavior During Therapy [facility] for tasks assessed/performed          Past Medical History:  Diagnosis Date   Anxiety    Basal cell carcinoma    Depression    Hypertension    Neuromuscular disorder (HCC)    Osteoarthritis    Osteopenia    Parkinson disease (HCC)    Pneumonia    Tremors of nervous system    legs   uterine ca 10/08/2021   also endometrial cancer   Past Surgical History:  Procedure Laterality Date   CARPAL TUNNEL RELEASE Bilateral 1996/1997   CYSTOSCOPY N/A 10/08/2021   Procedure: CYSTOSCOPY;  Surgeon: Viktoria Comer SAUNDERS, MD;  Location: [facility];  Service: Gynecology;  Laterality: N/A;   ROBOTIC ASSISTED TOTAL HYSTERECTOMY WITH BILATERAL SALPINGO OOPHERECTOMY N/A 10/08/2021   Procedure: XI ROBOTIC ASSISTED TOTAL HYSTERECTOMY WITH BILATERAL SALPINGO OOPHORECTOMY;SENTINEL LYMPH NODE INJECTION;  Surgeon: Viktoria Comer SAUNDERS, MD;  Location: [facility];  Service: Gynecology;  Laterality: N/A;   SQUAMOUS CELL CARCINOMA EXCISION Left 2017   Left jawline   TUBAL LIGATION  1980's   Patient Active Problem List   Diagnosis Date Noted   Lumbar spondylosis 12/23/2023   Coccydynia 12/23/2023   Endometrial cancer (HCC) 10/16/2021   Complex atypical endometrial hyperplasia 09/27/2021   Primary osteoarthritis of both hands 05/14/2020   Primary osteoarthritis of both feet 05/14/2020   Age-related osteoporosis without current pathological fracture 05/14/2020    Prediabetes 05/14/2020   Essential hypertension 05/14/2020   History of hyperlipidemia 05/14/2020   History of squamous cell carcinoma 05/14/2020   Parkinsonism (HCC) 05/14/2020    PCP: ***  REFERRING PROVIDER: ***  REFERRING DIAG: ***  Rationale for Evaluation and Treatment: Rehabilitation  THERAPY DIAG:  Other low back pain  Muscle weakness (generalized)  Other symptoms and signs involving the musculoskeletal system  Abnormality of gait and mobility  ONSET DATE: ***  SUBJECTIVE:  SUBJECTIVE STATEMENT:   Pt was standing and trying to put her foot in her show with a shoe horn.  She felt like her hip popped out and then popped back in.  She had PT in 2022 for back pain and states it didn't help, just changed the location.  Within a year, she began having pain in her coccyx.     Pt has been seeing PT for parkinson's since 02/10/24  Pt saw Dr. Arlana. Plan: Made referral to outpt physical therapy to address modailites, stretching, strengthening exercises for low back and pelvis. Trial of TENS, deep heating. HEP Trial of lidocaine  patches to pelvis Shortwave diathermy  Pt states the lidocaine  patches didn't help.  Pt has had a TENS unit for about 10 days and has used it almost everyday.  She reports the TENS unit helps.      Pt states opiods in the pat hasn't helped.    regarding low back pain which started in June of 2022 when she bent over to use a shoe horn. The pain was in her left hip. She received numerous injections into the left hip and low back over the last few years. None of the injections helped.   Increased pain with walking.  She states she gets muscular contractions in her back and back feels very tight.  Pain is traveling up her back.  She reports weakness in left leg  with walking.   Pt states she does big and loud exercises for her parkinson's and the stretches can cause her pain.  Pt has coccyx pain in sitting.   PERTINENT HISTORY:  Chronic back pain Parkinson's disease Anxity, HTN, uterine cancer, endometrial cancer, hernia,  OA   PAIN:  NPRS: Location:  central lumbar, bilat lumbar flanks, lower thoracic, coccyx  PRECAUTIONS: {Therapy precautions:24002}    WEIGHT BEARING RESTRICTIONS: No  FALLS:  Has patient fallen in last 6 months? Yes. Number of falls 2, at [employer] office when her leg gave way.  Fell at home when trying to get off of her sofa   LIVING ENVIRONMENT: Lives with: son lives with her Lives in: one level home Stairs: 1 step to enter home with rail Has following equipment at home: {Assistive devices:23999}  OCCUPATION: ***  PLOF: {PLOF:24004}  PATIENT GOALS: to be able to walk again without pain  NEXT MD VISIT: ***  OBJECTIVE:  Note: Objective measures were completed at Evaluation unless otherwise noted.  DIAGNOSTIC FINDINGS:  MRI in January of 2023 of her lumbar spine and pelvis which revealed the following.      L3-4: Mild disc bulging is asymmetric to the left. Mild facet hypertrophy is noted bilaterally. The central canal is patent. Mild left foraminal narrowing is present.   L4-5: A mild broad-based disc protrusion extends into the foramina bilaterally. Mild bilateral facet hypertrophy is noted. Mild foraminal narrowing is present bilaterally.   L5-S1: Minimal disc bulging is present. Mild left foraminal narrowing is noted.     1. No acute osseous injury of the pelvis. 2. Mild osteoarthritis of bilateral SI joints. 3. Partial-thickness tear of the gluteus medius tendon bilaterally. 4. Small partial-thickness tear of the left hamstring origin. 5. Broad-based disc bulge at L4-5 with bilateral mild facet arthropathy. Moderate bilateral facet arthropathy at L5-S1.  MRI in January in  2023:  FINDINGS: Segmentation: 5 non rib-bearing lumbar type vertebral bodies are present. The lowest fully formed vertebral body is L5.   Alignment:  Slight anterolisthesis is noted at L3-4 and L4-5.  Vertebrae: Scattered fatty infiltration of the marrow space is consistent with age. No focal lesions are present. Marrow signal and vertebral body heights are within normal limits.   Conus medullaris and cauda equina: Conus extends to the L1 level. Conus and cauda equina appear normal.   Paraspinal and other soft tissues: Limited imaging the abdomen is unremarkable. There is no significant adenopathy. No solid organ lesions are present.   Disc levels:   L1-2: Within normal limits.   L2-3: Within normal limits   L3-4: Mild disc bulging is asymmetric to the left. Mild facet hypertrophy is noted bilaterally. The central canal is patent. Mild left foraminal narrowing is present.   L4-5: A mild broad-based disc protrusion extends into the foramina bilaterally. Mild bilateral facet hypertrophy is noted. Mild foraminal narrowing is present bilaterally.   L5-S1: Minimal disc bulging is present. Mild left foraminal narrowing is noted.   IMPRESSION: 1. Mild left foraminal narrowing at L3-4 and L5-S1. 2. Mild foraminal narrowing bilaterally at L4-5. 3. Slight anterolisthesis at L3-4 and L4-5.  PATIENT SURVEYS:  {rehab surveys:24030}  COGNITION: Overall cognitive status: Within functional limits for tasks assessed     SENSATION: {sensation:27233}  MUSCLE LENGTH: Hamstrings: Right *** deg; Left *** deg Debby test: Right *** deg; Left *** deg  POSTURE: {posture:25561}  PALPATION: Tremor in R hand  Pt has soft tissue tightness in bilat lumbar paraspinals.  Minimal tenderness on R sided lumbar which is less on L.   LUMBAR ROM:   AROM eval  Flexion WFL  Extension   Right lateral flexion 50%  Left lateral flexion 50%  Right rotation 40%  Left rotation 40%   (Blank  rows = not tested)  LOWER EXTREMITY ROM:     {AROM/PROM:27142}  Right eval Left eval  Hip flexion    Hip extension    Hip abduction    Hip adduction    Hip internal rotation    Hip external rotation    Knee flexion    Knee extension    Ankle dorsiflexion    Ankle plantarflexion    Ankle inversion    Ankle eversion     (Blank rows = not tested)  LOWER EXTREMITY MMT:    MMT Right eval Left eval  Hip flexion 24.3 15.3  Hip extension    Hip abduction 23.2 16.1  Hip adduction    Hip internal rotation    Hip external rotation    Knee flexion    Knee extension 26.3 25.9  Ankle dorsiflexion    Ankle plantarflexion    Ankle inversion    Ankle eversion     (Blank rows = not tested)  LUMBAR SPECIAL TESTS:  {lumbar special test:25242}  FUNCTIONAL TESTS:  {Functional tests:24029}  GAIT: Distance walked: *** Assistive device utilized: {Assistive devices:23999} Level of assistance: {Levels of assistance:24026} Comments: fwd lean with gait  TREATMENT DATE: ***  PT reviewed HEP.   Fwd ball rollouts for flexion and lateral flexion Rows and extension with therabands Supine single K to C  Uses lower body ergometer  PATIENT EDUCATION:  Education details: *** Person educated: {Person educated:25204} Education method: {Education Method:25205} Education comprehension: {Education Comprehension:25206}  HOME EXERCISE PROGRAM: Will give next visit.  ASSESSMENT:  CLINICAL IMPRESSION: Patient is a *** y.o. *** who was seen today for physical therapy evaluation and treatment for ***.   OBJECTIVE IMPAIRMENTS: {opptimpairments:25111}.   ACTIVITY LIMITATIONS: {activitylimitations:27494}  PARTICIPATION LIMITATIONS: {participationrestrictions:25113}  PERSONAL FACTORS: {Personal factors:25162} are also affecting patient's functional outcome.   REHAB  POTENTIAL: Good  CLINICAL DECISION MAKING: Evolving/moderate complexity  EVALUATION COMPLEXITY: Moderate   GOALS:   SHORT TERM GOALS: Target date: ***  *** Baseline: Goal status: INITIAL  2.  *** Baseline:  Goal status: INITIAL  3.  *** Baseline:  Goal status: INITIAL  4.  *** Baseline:  Goal status: INITIAL  5.  *** Baseline:  Goal status: INITIAL  6.  *** Baseline:  Goal status: INITIAL  LONG TERM GOALS: Target date: 05/10/2024  *** Baseline:  Goal status: INITIAL  2.  *** Baseline:  Goal status: INITIAL  3.  *** Baseline:  Goal status: INITIAL  4.  *** Baseline:  Goal status: INITIAL  5.  *** Baseline:  Goal status: INITIAL  6.  *** Baseline:  Goal status: INITIAL  PLAN:  PT FREQUENCY: 1x/week  PT DURATION: 6 weeks-8 weeks  PLANNED INTERVENTIONS: {rehab planned interventions:25118::97110-Therapeutic exercises,97530- Therapeutic [phone]- Neuromuscular re-education,97535- Self Rjmz,02859- Manual therapy}.  PLAN FOR NEXT SESSION: ***   Mose Minerva, PT 03/16/2024, 5:34 PM

## 2024-03-16 ENCOUNTER — Other Ambulatory Visit: Payer: Self-pay

## 2024-03-16 ENCOUNTER — Encounter (HOSPITAL_BASED_OUTPATIENT_CLINIC_OR_DEPARTMENT_OTHER): Payer: Self-pay | Admitting: Physical Therapy

## 2024-03-21 DIAGNOSIS — G20A1 Parkinson's disease without dyskinesia, without mention of fluctuations: Secondary | ICD-10-CM | POA: Diagnosis not present

## 2024-03-22 ENCOUNTER — Ambulatory Visit

## 2024-03-22 ENCOUNTER — Ambulatory Visit: Admitting: Occupational Therapy

## 2024-03-22 DIAGNOSIS — R269 Unspecified abnormalities of gait and mobility: Secondary | ICD-10-CM

## 2024-03-22 DIAGNOSIS — R278 Other lack of coordination: Secondary | ICD-10-CM

## 2024-03-22 DIAGNOSIS — R29898 Other symptoms and signs involving the musculoskeletal system: Secondary | ICD-10-CM

## 2024-03-22 DIAGNOSIS — R2681 Unsteadiness on feet: Secondary | ICD-10-CM | POA: Diagnosis not present

## 2024-03-22 DIAGNOSIS — M6281 Muscle weakness (generalized): Secondary | ICD-10-CM

## 2024-03-22 DIAGNOSIS — R29818 Other symptoms and signs involving the nervous system: Secondary | ICD-10-CM

## 2024-03-22 DIAGNOSIS — M5459 Other low back pain: Secondary | ICD-10-CM

## 2024-03-22 DIAGNOSIS — G20A1 Parkinson's disease without dyskinesia, without mention of fluctuations: Secondary | ICD-10-CM

## 2024-03-22 NOTE — Therapy (Signed)
 OUTPATIENT PHYSICAL THERAPY NEURO TREATMENT   Patient Name: Ashley Neal MRN: [id number] DOB:1946-06-14, 78 y.o., female Today's Date: 03/22/2024   PCP: Caro Harlene POUR, NP  REFERRING PROVIDER: Caro Harlene POUR, NP   END OF SESSION:  PT End of Session - 03/22/24 1309     Visit Number 7    Number of Visits 9   13 for lumbar PT   Date for PT Re-Evaluation 04/08/24   05/10/2024 for lumbar   Authorization Type [facility]    PT Start Time 1315    PT Stop Time 1400    PT Time Calculation (min) 45 min    Activity Tolerance Patient tolerated treatment well    Behavior During Therapy [facility] for tasks assessed/performed           Past Medical History:  Diagnosis Date   Anxiety    Basal cell carcinoma    Depression    Hypertension    Neuromuscular disorder (HCC)    Osteoarthritis    Osteopenia    Parkinson disease (HCC)    Pneumonia    Tremors of nervous system    legs   uterine ca 10/08/2021   also endometrial cancer   Past Surgical History:  Procedure Laterality Date   CARPAL TUNNEL RELEASE Bilateral 1996/1997   CYSTOSCOPY N/A 10/08/2021   Procedure: CYSTOSCOPY;  Surgeon: Viktoria Comer SAUNDERS, MD;  Location: [facility];  Service: Gynecology;  Laterality: N/A;   ROBOTIC ASSISTED TOTAL HYSTERECTOMY WITH BILATERAL SALPINGO OOPHERECTOMY N/A 10/08/2021   Procedure: XI ROBOTIC ASSISTED TOTAL HYSTERECTOMY WITH BILATERAL SALPINGO OOPHORECTOMY;SENTINEL LYMPH NODE INJECTION;  Surgeon: Viktoria Comer SAUNDERS, MD;  Location: [facility];  Service: Gynecology;  Laterality: N/A;   SQUAMOUS CELL CARCINOMA EXCISION Left 2017   Left jawline   TUBAL LIGATION  1980's   Patient Active Problem List   Diagnosis Date Noted   Lumbar spondylosis 12/23/2023   Coccydynia 12/23/2023   Endometrial cancer (HCC) 10/16/2021   Complex atypical endometrial hyperplasia 09/27/2021   Primary osteoarthritis of both hands 05/14/2020   Primary osteoarthritis of both feet 05/14/2020   Age-related osteoporosis  without current pathological fracture 05/14/2020   Prediabetes 05/14/2020   Essential hypertension 05/14/2020   History of hyperlipidemia 05/14/2020   History of squamous cell carcinoma 05/14/2020   Parkinsonism (HCC) 05/14/2020    ONSET DATE: PD screen 01/08/24  REFERRING DIAG: G20.A1 (ICD-10-CM) - Parkinson disease, symptomatic (HCC)   THERAPY DIAG:  Other low back pain  Muscle weakness (generalized)  Other symptoms and signs involving the musculoskeletal system  Abnormality of gait and mobility  Unsteadiness on feet  Parkinson disease, symptomatic (HCC)  Rationale for Evaluation and Treatment: Rehabilitation  SUBJECTIVE:  SUBJECTIVE STATEMENT: The physioball rollout has helped the back feel much better Pt accompanied by: self  PERTINENT HISTORY: uterine cancer, endometrial cancer, hernia, PD, low back pain, OA  PAIN:  Are you having pain? Yes: NPRS scale: 3-4/10 Pain location: coccyx, lumbar spine Pain description: ache Aggravating factors: walking aggravates pain Relieving factors: sitting, lie on heating pad  PRECAUTIONS: Fall, orthostatic hypotension, Coccydynia with associated sacral discomfort, lumbar spondylosis   RED FLAGS: None   WEIGHT BEARING RESTRICTIONS: No  FALLS: Has patient fallen in last 6 months? Yes. Number of falls 2   LIVING ENVIRONMENT: Lives with: lives with their family and lives with their son Lives in: House/apartment Stairs: 2 steps from garage with rails Has following equipment at home: Environmental consultant - 4 wheeled and Wheelchair (manual)  PLOF: Independent  PATIENT GOALS: To be able to get out of bed by myself (it currently takes 5-10 minutes); to get up off the sofa; to get dressed, to be able to walk better  OBJECTIVE:   TODAY'S TREATMENT:  03/22/24 Activity Comments  LE PRE -LAQ, 4#, 3x10 -hip add iso 3x10 -heel slides, manual resist 3x10 -SAQ, 4#, 3x10   -Supine PWR step 1x10 -Bed mobility CGA-min A for sequencing sit<>supine Initially requiring tactile cues for sequence  DKTC x 2 min Assist for holding  LTR x 2 min                 HOME EXERCISE PROGRAM: Access Code: [email or web address] URL: [email or web address] Date: 03/01/2024 Prepared by: [facility] - Outpatient  Rehab - [facility]  Exercises - Seated Flexion Stretch with Swiss Ball  - 1 x daily - 7 x weekly - 1 sets - 5-10 reps  PATIENT EDUCATION: Education details: HEP, TENS unit education-see above Person educated: Patient Education method: Explanation, Demonstration, and Handouts Education comprehension: verbalized understanding, returned demonstration, and needs further education  -------------------------------------------------------------- Note: Objective measures were completed at Evaluation unless otherwise noted.  DIAGNOSTIC FINDINGS: No recent images for this episode  COGNITION: Overall cognitive status: Within functional limits for tasks assessed    POSTURE: rounded shoulders, forward head, and posterior pelvic tilt  PALPATION:  Tenderness to palpation along paraspinals lumbar>thoracic spine  LOWER EXTREMITY ROM:     Active  Right Eval Left Eval  Hip flexion    Hip extension    Hip abduction    Hip adduction    Hip internal rotation    Hip external rotation    Knee flexion    Knee extension    Ankle dorsiflexion neutral 5 degrees  Ankle plantarflexion    Ankle inversion    Ankle eversion     (Blank rows = not tested)  LOWER EXTREMITY MMT:    MMT Right Eval Left Eval  Hip flexion 4 4  Hip extension    Hip abduction    Hip adduction    Hip internal rotation    Hip external rotation    Knee flexion 4 4  Knee extension 4 4  Ankle dorsiflexion 3+ 3+  Ankle plantarflexion    Ankle inversion    Ankle  eversion    (Blank rows = not tested)  VITALS:   131/89 HR 84 in sitting   105/75 HR 87 in sitting    TRANSFERS: Sit to stand: SBA  Assistive device utilized: UE support at chair rails     Stand to sit: SBA  Assistive device utilized: UE support      GAIT: Findings: Gait Characteristics: step through pattern, decreased arm  swing- Right, decreased arm swing- Left, decreased step length- Right, decreased step length- Left, shuffling, festinating, and trunk flexed, Distance walked: 50 ft, Assistive device utilized:None, Level of assistance: SBA, and Comments: Fast pace, shuffling steps; gait is limited due to low back and coccyx pain, per pt report  FUNCTIONAL TESTS:  Timed up and go (TUG): 15.25 sec 10 meter walk test: 11.03 sec (2.97 ft/sec) TUG cognitive:  20.12 sec 3 M walk back:  attempted-only able to go 2 ft in 13.13 sec with hastening, decreased foot clearance 360 R:  11.46 sec in 22 steps 360 L:  11.25 sec in 23 steps                                                                                                                               TREATMENT DATE: 02/10/2024    PATIENT EDUCATION: Education details: Eval results, POC Person educated: Patient Education method: Explanation Education comprehension: verbalized understanding  HOME EXERCISE PROGRAM: Not yet initiated  GOALS: Goals reviewed with patient? Yes  SHORT TERM GOALS: Target date: 03/11/2024  Pt will be independent with HEP for improved back pain, posture, balance, gait. Baseline: Goal status: IN PROGRESS  2.  Pt will improve TUG score to less than or equal to 13 sec for decreased fall risk. Baseline: >15 sec Goal status: IN PROGRESS  3.  Pt will perform 360 turns R and L in 15 steps or less, for improved balance. Baseline: 22-23 steps Goal status: IN PROGRESS   4.  Pt will report at least 50% improvement in bed mobility.  Baseline  Goal status: IN PROGRESS  LONG TERM GOALS: Target date:  04/08/2024  Pt will be independent with HEP for improved balance, gait. Baseline:  Goal status: IN PROGRESS  2.  Pt will improve TUG cognitive score to less than or equal to 15 sec for decreased fall risk.  Baseline: 20 sec Goal status: IN PROGRESS  3.  Pt will perform 3 M backwards walk test in less than or equal to 15 seconds for improved balance. Baseline: unable to complete Goal status: IN PROGRESS  4.  Pt will verbalize understanding of fall prevention in the home environment. Baseline:  Goal status: IN PROGRESS  5.  Pt will report at least 50% reduction in low back pain, to improve overall functional mobility Baseline: 4-8/10 Goal status: IN PROGRESS  ASSESSMENT:  CLINICAL IMPRESSION: LE strengthening to improve activity tolerance and LE power w/ cues for full ROM and control. Stretching w/ flexion bias to improve lumbar spine mobility and reduce pain. Mobility training to improve independence w/ bed mobility w/ tactile cues for sequence and increased amplitude to movement w/ improved performance following practice but difficulty with initiation. Best performance w/ tactile cues for facilitation. Continued sessions to advance POC details to improve mobility and reduce risk for falls.   OBJECTIVE IMPAIRMENTS: Abnormal gait, decreased balance, decreased mobility, difficulty walking, decreased strength, increased muscle spasms, postural dysfunction,  and pain.   ACTIVITY LIMITATIONS: bending, sitting, standing, transfers, bed mobility, hygiene/grooming, and locomotion level  PARTICIPATION LIMITATIONS: cleaning, laundry, shopping, community activity, and exercise  PERSONAL FACTORS: 3+ comorbidities: see above are also affecting patient's functional outcome.   REHAB POTENTIAL: Good  CLINICAL DECISION MAKING: Evolving/moderate complexity  EVALUATION COMPLEXITY: Moderate  PLAN:  PT FREQUENCY: 1x/week (per patient request)  PT DURATION: 8 weeks plus eval  PLANNED  INTERVENTIONS: 97750- Physical Performance Testing, 97110-Therapeutic exercises, 97530- Therapeutic activity, 97112- Neuromuscular re-education, 97535- Self Care, 02859- Manual therapy, [phone]- Gait training, Patient/Family education, and Balance training  PLAN FOR NEXT SESSION: Review HEP, ask about how TENS unit worked; work on bed mobility, posture, back exercises, gait and balance exercise   Jonette MARLA Sandifer, PT 03/22/2024, 1:09 PM  [facility] at [facility] [address] [place], [place] [address] Phone # [phone] Fax # [phone]
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
## 2024-03-23 ENCOUNTER — Encounter: Admitting: Physical Medicine & Rehabilitation

## 2024-03-24 ENCOUNTER — Other Ambulatory Visit: Payer: Self-pay

## 2024-03-24 DIAGNOSIS — G20A1 Parkinson's disease without dyskinesia, without mention of fluctuations: Secondary | ICD-10-CM

## 2024-03-24 DIAGNOSIS — M4716 Other spondylosis with myelopathy, lumbar region: Secondary | ICD-10-CM

## 2024-03-24 NOTE — Patient Instructions (Signed)
 Visit Information  Thank you for taking time to visit with me today. Please don't hesitate to contact me if I can be of assistance to you before our next scheduled appointment.  Your next care management appointment is by telephone on Monday, September 15 at 12:00 PM  Please call the care guide team at [phone] if you need to cancel, schedule, or reschedule an appointment.   Please call 1-800-273-TALK (toll free, 24 hour hotline) if you are experiencing a Mental Health or Behavioral Health Crisis or need someone to talk to.  Clayborne Ly RN BSN CCM   [facility], [facility] Health Nurse Care Coordinator  Direct Dial: [phone] Website: [email or web address]

## 2024-03-24 NOTE — Patient Outreach (Signed)
 Complex Care Management   Visit Note  03/24/2024  Name:  Ashley Neal MRN: [id number] DOB: 11/08/1945  Situation: Referral received for Complex Care Management related to Parkinson's disease; lumbar radiculopathy; Orthostatic Hypotension, Falls. I obtained verbal consent from Patient.  Visit completed with patient on the phone.  Background:   Past Medical History:  Diagnosis Date   Anxiety    Basal cell carcinoma    Depression    Hypertension    Neuromuscular disorder (HCC)    Osteoarthritis    Osteopenia    Parkinson disease (HCC)    Pneumonia    Tremors of nervous system    legs   uterine ca 10/08/2021   also endometrial cancer    Assessment: Patient Reported Symptoms:  Cognitive Cognitive Status: Alert and oriented to person, place, and time, Normal speech and language skills Cognitive/Intellectual Conditions Management [RPT]: None reported or documented in medical history or problem list   Health Maintenance Behaviors: Annual physical exam, Healthy diet, Exercise Health Facilitated by: Healthy diet, Rest  Neurological Neurological Review of Symptoms: Other: [place] Neurological Symptoms/Conditions [RPT]: tremors, dyskensia, hallucinations, psuedobulbar affect Neurological Management Strategies: Medication therapy, Routine screening, Exercise Neurological Self-Management Outcome: 4 (good)  HEENT HEENT Symptoms Reported: No symptoms reported      Cardiovascular Cardiovascular Symptoms Reported: Other: Other Cardiovascular Symptoms: Orthostatic Hypotension Does patient have uncontrolled Hypertension?: No Cardiovascular Management Strategies: Fluid modification, Routine screening, Medication therapy Cardiovascular Self-Management Outcome: 3 (uncertain)  Respiratory      Endocrine Endocrine Symptoms Reported: No symptoms reported    Gastrointestinal Gastrointestinal Symptoms Reported: No symptoms reported      Genitourinary Genitourinary Symptoms Reported: No  symptoms reported    Integumentary Integumentary Symptoms Reported: Not assessed    Musculoskeletal Musculoskelatal Symptoms Reviewed: Back pain, Difficulty walking, Unsteady gait, Limited mobility Musculoskeletal Management Strategies: Adequate rest, Exercise, Routine screening Musculoskeletal Comment: patient continues to work with Neuro PT, she will continue outpatient PT for her back pain starting the first week in September      Psychosocial Psychosocial Symptoms Reported: No symptoms reported   Major Change/Loss/Stressor/Fears (CP): Medical condition, self Techniques to Cope with Loss/Stress/Change: Diversional activities, Exercise Quality of Family Relationships: supportive Do you feel physically threatened by others?: No      01/08/2024    1:55 PM  Depression screen PHQ 2/9  Decreased Interest 0  Down, Depressed, Hopeless 0  PHQ - 2 Score 0    There were no vitals filed for this visit.  Medications Reviewed Today     Reviewed by Morgan Clayborne CROME, RN (Registered Nurse) on 03/24/24 at 1251  Med List Status: <None>   Medication Order Taking? Sig Documenting Provider Last Dose Status Informant  acetaminophen  (TYLENOL ) 500 MG tablet [id number]  Take 500 mg by mouth every 6 (six) hours as needed for moderate pain (pain score 4-6). [provider]  Active   caffeine 200 MG TABS tablet [id number]  Take 200 mg by mouth daily.  Patient not taking: Reported on 03/17/2024   [provider]  Active            Med Note (   Tue Feb 09, 2024  2:45 PM) To raise BP per pt  carbidopa -levodopa  (SINEMET  IR) 25-100 MG tablet [id number]  Take 1 tablet by mouth 5 (five) times daily. [provider]  Active   escitalopram (LEXAPRO) 20 MG tablet [id number]  Take 20 mg by mouth daily. [provider]  Active   ibuprofen (ADVIL) 200 MG tablet [id number]  Take 200 mg by mouth every 6 (six) hours as needed for moderate pain (pain score 4-6). [provider]   Active   Multiple Vitamin (MULTI-VITAMIN) tablet [id number]  Take 1 tablet by mouth daily. [provider]  Active             Recommendation:   Continue working with Neuro Rehab as directed  Specialty provider follow-up with Dr. Buck on Thursday, September 11 at 1:15 PM PCP Follow-up with Harlene An NP on Monday, September 22 at 11:20 AM  Follow Up Plan:   Telephone follow up appointment with social worker Tobias Moose BSW date/time: Monday, August 18 at 1:30 PM Telephone follow up appointment with nurse care manager date/time:  Monday, September 15 at 12:00 PM  Clayborne Ly RN BSN CCM [facility]  [facility], [facility] Health Nurse Care Coordinator  Direct Dial: [phone] Website: [email or web address]

## 2024-03-25 ENCOUNTER — Telehealth: Payer: Self-pay

## 2024-03-25 NOTE — Progress Notes (Signed)
 Complex Care Management Note Care Guide Note  03/25/2024 Name: Ashley Neal MRN: [id number] DOB: 1946-01-13  Damien LULLA Blalock is a 78 y.o. year old female who is a primary care patient of Caro Harlene POUR, NP . The community resource team was consulted for assistance with community resources.  SDOH screenings and interventions completed:  No        Care guide performed the following interventions: Patient provided with information about care guide support team and interviewed to confirm resource needs.  Follow Up Plan:  Patient requested I call her back on Wednesday, August 19 at 10 am.  Encounter Outcome:  Patient Visit Completed  Alline Pio [facility]  [facility] Guide Direct Dial: [phone]  Fax: [phone] Website: [email or web address]

## 2024-03-28 ENCOUNTER — Telehealth: Payer: Self-pay | Admitting: Licensed Clinical Social Worker

## 2024-03-28 DIAGNOSIS — H25813 Combined forms of age-related cataract, bilateral: Secondary | ICD-10-CM | POA: Diagnosis not present

## 2024-03-28 DIAGNOSIS — H02834 Dermatochalasis of left upper eyelid: Secondary | ICD-10-CM | POA: Diagnosis not present

## 2024-03-28 DIAGNOSIS — H02831 Dermatochalasis of right upper eyelid: Secondary | ICD-10-CM | POA: Diagnosis not present

## 2024-03-28 DIAGNOSIS — H35363 Drusen (degenerative) of macula, bilateral: Secondary | ICD-10-CM | POA: Diagnosis not present

## 2024-03-29 ENCOUNTER — Ambulatory Visit

## 2024-03-29 ENCOUNTER — Ambulatory Visit: Admitting: Occupational Therapy

## 2024-03-29 ENCOUNTER — Telehealth: Payer: Self-pay

## 2024-03-29 DIAGNOSIS — R29818 Other symptoms and signs involving the nervous system: Secondary | ICD-10-CM

## 2024-03-29 DIAGNOSIS — M6281 Muscle weakness (generalized): Secondary | ICD-10-CM

## 2024-03-29 DIAGNOSIS — R278 Other lack of coordination: Secondary | ICD-10-CM

## 2024-03-29 DIAGNOSIS — R2681 Unsteadiness on feet: Secondary | ICD-10-CM

## 2024-03-29 DIAGNOSIS — R269 Unspecified abnormalities of gait and mobility: Secondary | ICD-10-CM

## 2024-03-29 DIAGNOSIS — M5459 Other low back pain: Secondary | ICD-10-CM

## 2024-03-29 DIAGNOSIS — R29898 Other symptoms and signs involving the musculoskeletal system: Secondary | ICD-10-CM

## 2024-03-29 NOTE — Therapy (Signed)
 OUTPATIENT PHYSICAL THERAPY NEURO TREATMENT   Patient Name: Ashley Neal MRN: [id number] DOB:Dec 17, 1945, 78 y.o., female Today's Date: 03/29/2024   PCP: Caro Harlene POUR, NP  REFERRING PROVIDER: Caro Harlene POUR, NP   END OF SESSION:  PT End of Session - 03/29/24 1412     Visit Number 8    Number of Visits 9   13 for lumbar PT   Date for PT Re-Evaluation 04/08/24   05/10/2024 for lumbar   Authorization Type [facility]    PT Start Time 1405    PT Stop Time 1445    PT Time Calculation (min) 40 min    Activity Tolerance Patient tolerated treatment well    Behavior During Therapy [facility] for tasks assessed/performed           Past Medical History:  Diagnosis Date   Anxiety    Basal cell carcinoma    Depression    Hypertension    Neuromuscular disorder (HCC)    Osteoarthritis    Osteopenia    Parkinson disease (HCC)    Pneumonia    Tremors of nervous system    legs   uterine ca 10/08/2021   also endometrial cancer   Past Surgical History:  Procedure Laterality Date   CARPAL TUNNEL RELEASE Bilateral 1996/1997   CYSTOSCOPY N/A 10/08/2021   Procedure: CYSTOSCOPY;  Surgeon: Viktoria Comer SAUNDERS, MD;  Location: [facility];  Service: Gynecology;  Laterality: N/A;   ROBOTIC ASSISTED TOTAL HYSTERECTOMY WITH BILATERAL SALPINGO OOPHERECTOMY N/A 10/08/2021   Procedure: XI ROBOTIC ASSISTED TOTAL HYSTERECTOMY WITH BILATERAL SALPINGO OOPHORECTOMY;SENTINEL LYMPH NODE INJECTION;  Surgeon: Viktoria Comer SAUNDERS, MD;  Location: [facility];  Service: Gynecology;  Laterality: N/A;   SQUAMOUS CELL CARCINOMA EXCISION Left 2017   Left jawline   TUBAL LIGATION  1980's   Patient Active Problem List   Diagnosis Date Noted   Lumbar spondylosis 12/23/2023   Coccydynia 12/23/2023   Endometrial cancer (HCC) 10/16/2021   Complex atypical endometrial hyperplasia 09/27/2021   Primary osteoarthritis of both hands 05/14/2020   Primary osteoarthritis of both feet 05/14/2020   Age-related osteoporosis  without current pathological fracture 05/14/2020   Prediabetes 05/14/2020   Essential hypertension 05/14/2020   History of hyperlipidemia 05/14/2020   History of squamous cell carcinoma 05/14/2020   Parkinsonism (HCC) 05/14/2020    ONSET DATE: PD screen 01/08/24  REFERRING DIAG: G20.A1 (ICD-10-CM) - Parkinson disease, symptomatic (HCC)   THERAPY DIAG:  Muscle weakness (generalized)  Other symptoms and signs involving the nervous system  Other low back pain  Other symptoms and signs involving the musculoskeletal system  Abnormality of gait and mobility  Unsteadiness on feet  Rationale for Evaluation and Treatment: Rehabilitation  SUBJECTIVE:  SUBJECTIVE STATEMENT: Not having a good day. Feeling lightheaded with standing and freezing of gait Pt accompanied by: self  PERTINENT HISTORY: uterine cancer, endometrial cancer, hernia, PD, low back pain, OA  PAIN:  Are you having pain? Yes: NPRS scale: 3-4/10 Pain location: coccyx, lumbar spine Pain description: ache Aggravating factors: walking aggravates pain Relieving factors: sitting, lie on heating pad  PRECAUTIONS: Fall, orthostatic hypotension, Coccydynia with associated sacral discomfort, lumbar spondylosis   RED FLAGS: None   WEIGHT BEARING RESTRICTIONS: No  FALLS: Has patient fallen in last 6 months? Yes. Number of falls 2   LIVING ENVIRONMENT: Lives with: lives with their family and lives with their son Lives in: House/apartment Stairs: 2 steps from garage with rails Has following equipment at home: Environmental consultant - 4 wheeled and Wheelchair (manual)  PLOF: Independent  PATIENT GOALS: To be able to get out of bed by myself (it currently takes 5-10 minutes); to get up off the sofa; to get dressed, to be able to walk  better  OBJECTIVE:   TODAY'S TREATMENT: 03/29/24 Activity Comments  NU-step x 8 min 3 min self-paced warm-up 30 Forced pace intervals x 60 sec 100+ SPM, 30 sec recovery  Seated/standing PWR moves 1x10 Fast pace and flow  Static multisensory balance               TODAY'S TREATMENT: 03/22/24 Activity Comments  LE PRE -LAQ, 4#, 3x10 -hip add iso 3x10 -heel slides, manual resist 3x10 -SAQ, 4#, 3x10   -Supine PWR step 1x10 -Bed mobility CGA-min A for sequencing sit<>supine Initially requiring tactile cues for sequence  DKTC x 2 min Assist for holding  LTR x 2 min                 HOME EXERCISE PROGRAM: Access Code: [email or web address] URL: [email or web address] Date: 03/01/2024 Prepared by: [facility] - Outpatient  Rehab - [facility]  Exercises - Seated Flexion Stretch with Swiss Ball  - 1 x daily - 7 x weekly - 1 sets - 5-10 reps  PATIENT EDUCATION: Education details: HEP, TENS unit education-see above Person educated: Patient Education method: Explanation, Demonstration, and Handouts Education comprehension: verbalized understanding, returned demonstration, and needs further education  -------------------------------------------------------------- Note: Objective measures were completed at Evaluation unless otherwise noted.  DIAGNOSTIC FINDINGS: No recent images for this episode  COGNITION: Overall cognitive status: Within functional limits for tasks assessed    POSTURE: rounded shoulders, forward head, and posterior pelvic tilt  PALPATION:  Tenderness to palpation along paraspinals lumbar>thoracic spine  LOWER EXTREMITY ROM:     Active  Right Eval Left Eval  Hip flexion    Hip extension    Hip abduction    Hip adduction    Hip internal rotation    Hip external rotation    Knee flexion    Knee extension    Ankle dorsiflexion neutral 5 degrees  Ankle plantarflexion    Ankle inversion    Ankle eversion     (Blank rows = not  tested)  LOWER EXTREMITY MMT:    MMT Right Eval Left Eval  Hip flexion 4 4  Hip extension    Hip abduction    Hip adduction    Hip internal rotation    Hip external rotation    Knee flexion 4 4  Knee extension 4 4  Ankle dorsiflexion 3+ 3+  Ankle plantarflexion    Ankle inversion    Ankle eversion    (Blank rows = not tested)  VITALS:   131/89  HR 84 in sitting   105/75 HR 87 in sitting    TRANSFERS: Sit to stand: SBA  Assistive device utilized: UE support at chair rails     Stand to sit: SBA  Assistive device utilized: UE support      GAIT: Findings: Gait Characteristics: step through pattern, decreased arm swing- Right, decreased arm swing- Left, decreased step length- Right, decreased step length- Left, shuffling, festinating, and trunk flexed, Distance walked: 50 ft, Assistive device utilized:None, Level of assistance: SBA, and Comments: Fast pace, shuffling steps; gait is limited due to low back and coccyx pain, per pt report  FUNCTIONAL TESTS:  Timed up and go (TUG): 15.25 sec 10 meter walk test: 11.03 sec (2.97 ft/sec) TUG cognitive:  20.12 sec 3 M walk back:  attempted-only able to go 2 ft in 13.13 sec with hastening, decreased foot clearance 360 R:  11.46 sec in 22 steps 360 L:  11.25 sec in 23 steps                                                                                                                               TREATMENT DATE: 02/10/2024    PATIENT EDUCATION: Education details: Eval results, POC Person educated: Patient Education method: Explanation Education comprehension: verbalized understanding  HOME EXERCISE PROGRAM: Not yet initiated  GOALS: Goals reviewed with patient? Yes  SHORT TERM GOALS: Target date: 03/11/2024  Pt will be independent with HEP for improved back pain, posture, balance, gait. Baseline: Goal status: IN PROGRESS  2.  Pt will improve TUG score to less than or equal to 13 sec for decreased fall risk. Baseline: >15  sec Goal status: IN PROGRESS  3.  Pt will perform 360 turns R and L in 15 steps or less, for improved balance. Baseline: 22-23 steps Goal status: IN PROGRESS   4.  Pt will report at least 50% improvement in bed mobility.  Baseline  Goal status: IN PROGRESS  LONG TERM GOALS: Target date: 04/08/2024  Pt will be independent with HEP for improved balance, gait. Baseline:  Goal status: IN PROGRESS  2.  Pt will improve TUG cognitive score to less than or equal to 15 sec for decreased fall risk.  Baseline: 20 sec Goal status: IN PROGRESS  3.  Pt will perform 3 M backwards walk test in less than or equal to 15 seconds for improved balance. Baseline: unable to complete Goal status: IN PROGRESS  4.  Pt will verbalize understanding of fall prevention in the home environment. Baseline:  Goal status: IN PROGRESS  5.  Pt will report at least 50% reduction in low back pain, to improve overall functional mobility Baseline: 4-8/10 Goal status: IN PROGRESS  ASSESSMENT:  CLINICAL IMPRESSION: Nustep for rapid alternating movement for coordination with therapist assist for forced pace. Notes improved balance and motor control thereafter. Large amplitude movements for balance and coordination. Static multisensory balance to improve postural stability for increased safety during ADL.  Will continue x 1 session for D/C assessment and pt is transitioning to other PT for LBP tx.  OBJECTIVE IMPAIRMENTS: Abnormal gait, decreased balance, decreased mobility, difficulty walking, decreased strength, increased muscle spasms, postural dysfunction, and pain.   ACTIVITY LIMITATIONS: bending, sitting, standing, transfers, bed mobility, hygiene/grooming, and locomotion level  PARTICIPATION LIMITATIONS: cleaning, laundry, shopping, community activity, and exercise  PERSONAL FACTORS: 3+ comorbidities: see above are also affecting patient's functional outcome.   REHAB POTENTIAL: Good  CLINICAL DECISION MAKING:  Evolving/moderate complexity  EVALUATION COMPLEXITY: Moderate  PLAN:  PT FREQUENCY: 1x/week (per patient request)  PT DURATION: 8 weeks plus eval  PLANNED INTERVENTIONS: 97750- Physical Performance Testing, 97110-Therapeutic exercises, 97530- Therapeutic activity, 97112- Neuromuscular re-education, 97535- Self Care, 02859- Manual therapy, [phone]- Gait training, Patient/Family education, and Balance training  PLAN FOR NEXT SESSION: D/C assessment, pt is transferring to outpatient ortho for LBP tx   Jonette MARLA Sandifer, PT 03/29/2024, 2:19 PM  [facility] at [facility] [address] [place], [place] [address] Phone # [phone] Fax # [phone]

## 2024-03-29 NOTE — Progress Notes (Signed)
 Complex Care Management Note Care Guide Note  03/29/2024 Name: Ashley Neal MRN: [id number] DOB: [birth year]  Damien LULLA Blalock is a 78 y.o. year old female who is a primary care patient of Caro Harlene POUR, NP . The community resource team was consulted for assistance with Transportation Needs  and VA benefit information.  SDOH screenings and interventions completed:  Yes  Social Drivers of Health From This Encounter   Food Insecurity: No Food Insecurity (03/29/2024)   Hunger Vital Sign    Worried About Running Out of Food in the Last Year: Never true    Ran Out of Food in the Last Year: Never true  Housing: Low Risk  (03/29/2024)   Housing Stability Vital Sign    Unable to Pay for Housing in the Last Year: No    Number of Times Moved in the Last Year: 0    Homeless in the Last Year: No  Financial Resource Strain: Low Risk  (03/29/2024)   Overall Financial Resource Strain (CARDIA)    Difficulty of Paying Living Expenses: Not very hard  Transportation Needs: No Transportation Needs (03/29/2024)   PRAPARE - Administrator, Civil Service (Medical): No    Lack of Transportation (Non-Medical): No  Utilities: Not At Risk (03/29/2024)   Utilities    Threatened with loss of utilities: No    SDOH Interventions Today    Flowsheet Row Most Recent Value  SDOH Interventions   Transportation Interventions Other (Comment)  [Mailed information for [employer] [place] and [place] transportation.]     Care guide performed the following interventions: Patient provided with information about care guide support team and interviewed to confirm resource needs Mailed information for [employer], [employer], [employer], [place] Aid and Attendance program.  Follow Up Plan:  No further follow up planned at this time. The patient has been provided with needed resources.  Encounter Outcome:  Patient Visit Completed  Celina Shiley [facility]  [facility]  Guide Direct Dial: [phone]  Fax: [phone] Website: [email or web address]
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
## 2024-03-31 DIAGNOSIS — G20A1 Parkinson's disease without dyskinesia, without mention of fluctuations: Secondary | ICD-10-CM | POA: Diagnosis not present

## 2024-03-31 DIAGNOSIS — M5440 Lumbago with sciatica, unspecified side: Secondary | ICD-10-CM | POA: Diagnosis not present

## 2024-04-05 ENCOUNTER — Ambulatory Visit

## 2024-04-05 ENCOUNTER — Ambulatory Visit: Admitting: Occupational Therapy

## 2024-04-05 DIAGNOSIS — R278 Other lack of coordination: Secondary | ICD-10-CM

## 2024-04-05 DIAGNOSIS — M6281 Muscle weakness (generalized): Secondary | ICD-10-CM

## 2024-04-05 DIAGNOSIS — R2681 Unsteadiness on feet: Secondary | ICD-10-CM

## 2024-04-05 DIAGNOSIS — R269 Unspecified abnormalities of gait and mobility: Secondary | ICD-10-CM

## 2024-04-05 DIAGNOSIS — R29818 Other symptoms and signs involving the nervous system: Secondary | ICD-10-CM

## 2024-04-05 DIAGNOSIS — R29898 Other symptoms and signs involving the musculoskeletal system: Secondary | ICD-10-CM

## 2024-04-05 DIAGNOSIS — G20A1 Parkinson's disease without dyskinesia, without mention of fluctuations: Secondary | ICD-10-CM
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
## 2024-04-05 NOTE — Therapy (Signed)
 OUTPATIENT PHYSICAL THERAPY NEURO TREATMENT and D/C Summary   Patient Name: Ashley Neal MRN: [id number] DOB:30-Jul-1946, 78 y.o., female Today's Date: 04/05/2024   PCP: Caro Harlene POUR, NP  REFERRING PROVIDER: Caro Harlene POUR, NP   PHYSICAL THERAPY DISCHARGE SUMMARY  Visits from Start of Care: 9  Current functional level related to goals / functional outcomes: See below   Remaining deficits: Bradykinesia, balance deficits, risk for falls, gait deviations   Education / Equipment: HEP, AE/AD recommendations, fall prevention measures   Patient agrees to discharge. Patient goals were partially met. Patient is being discharged due to pt transferring to ortho clinic for LBP tx.   END OF SESSION:  PT End of Session - 04/05/24 1312     Visit Number 9    Number of Visits 9   13 for lumbar PT   Date for PT Re-Evaluation 04/08/24   05/10/2024 for lumbar   Authorization Type [facility]    PT Start Time 1315    PT Stop Time 1400    PT Time Calculation (min) 45 min    Activity Tolerance Patient tolerated treatment well    Behavior During Therapy WFL for tasks assessed/performed           Past Medical History:  Diagnosis Date   Anxiety    Basal cell carcinoma    Depression    Hypertension    Neuromuscular disorder (HCC)    Osteoarthritis    Osteopenia    Parkinson disease (HCC)    Pneumonia    Tremors of nervous system    legs   uterine ca 10/08/2021   also endometrial cancer   Past Surgical History:  Procedure Laterality Date   CARPAL TUNNEL RELEASE Bilateral 1996/1997   CYSTOSCOPY N/A 10/08/2021   Procedure: CYSTOSCOPY;  Surgeon: Viktoria Comer SAUNDERS, MD;  Location: [facility];  Service: Gynecology;  Laterality: N/A;   ROBOTIC ASSISTED TOTAL HYSTERECTOMY WITH BILATERAL SALPINGO OOPHERECTOMY N/A 10/08/2021   Procedure: XI ROBOTIC ASSISTED TOTAL HYSTERECTOMY WITH BILATERAL SALPINGO OOPHORECTOMY;SENTINEL LYMPH NODE INJECTION;  Surgeon: Viktoria Comer SAUNDERS, MD;   Location: [facility];  Service: Gynecology;  Laterality: N/A;   SQUAMOUS CELL CARCINOMA EXCISION Left 2017   Left jawline   TUBAL LIGATION  1980's   Patient Active Problem List   Diagnosis Date Noted   Lumbar spondylosis 12/23/2023   Coccydynia 12/23/2023   Endometrial cancer (HCC) 10/16/2021   Complex atypical endometrial hyperplasia 09/27/2021   Primary osteoarthritis of both hands 05/14/2020   Primary osteoarthritis of both feet 05/14/2020   Age-related osteoporosis without current pathological fracture 05/14/2020   Prediabetes 05/14/2020   Essential hypertension 05/14/2020   History of hyperlipidemia 05/14/2020   History of squamous cell carcinoma 05/14/2020   Parkinsonism (HCC) 05/14/2020    ONSET DATE: PD screen 01/08/24  REFERRING DIAG: G20.A1 (ICD-10-CM) - Parkinson disease, symptomatic (HCC)   THERAPY DIAG:  Muscle weakness (generalized)  Other symptoms and signs involving the nervous system  Other symptoms and signs involving the musculoskeletal system  Abnormality of gait and mobility  Unsteadiness on feet  Parkinson disease, symptomatic (HCC)  Rationale for Evaluation and Treatment: Rehabilitation  SUBJECTIVE:  SUBJECTIVE STATEMENT: Not having a good day. Feeling lightheaded with standing and freezing of gait Pt accompanied by: self  PERTINENT HISTORY: uterine cancer, endometrial cancer, hernia, PD, low back pain, OA  PAIN:  Are you having pain? Yes: NPRS scale: 5/10 Pain location: coccyx, lumbar spine Pain description: ache Aggravating factors: walking aggravates pain Relieving factors: sitting, lie on heating pad  PRECAUTIONS: Fall, orthostatic hypotension, Coccydynia with associated sacral discomfort, lumbar spondylosis   RED FLAGS: None   WEIGHT BEARING  RESTRICTIONS: No  FALLS: Has patient fallen in last 6 months? Yes. Number of falls 2   LIVING ENVIRONMENT: Lives with: lives with their family and lives with their son Lives in: House/apartment Stairs: 2 steps from garage with rails Has following equipment at home: Environmental consultant - 4 wheeled and Wheelchair (manual)  PLOF: Independent  PATIENT GOALS: To be able to get out of bed by myself (it currently takes 5-10 minutes); to get up off the sofa; to get dressed, to be able to walk better  OBJECTIVE:   TODAY'S TREATMENT: 04/05/24 Activity Comments  Vitals  98/71 mmHg, 87 bpm seated 78/61 mmHg, 90 bpm standing x 1 min    TUG test: 13 sec TUG cog: 20 sec   LE PRE 3x10 To improve strength and elevate BP                HOME EXERCISE PROGRAM: Access Code: [email or web address] URL: [email or web address] Date: 03/01/2024 Prepared by: [facility] Outpatient  Rehab - [facility]  Exercises - Seated Flexion Stretch with Swiss Ball  - 1 x daily - 7 x weekly - 1 sets - 5-10 reps  PATIENT EDUCATION: Education details: HEP, TENS unit education-see above Person educated: Patient Education method: Explanation, Demonstration, and Handouts Education comprehension: verbalized understanding, returned demonstration, and needs further education  -------------------------------------------------------------- Note: Objective measures were completed at Evaluation unless otherwise noted.  DIAGNOSTIC FINDINGS: No recent images for this episode  COGNITION: Overall cognitive status: Within functional limits for tasks assessed    POSTURE: rounded shoulders, forward head, and posterior pelvic tilt  PALPATION:  Tenderness to palpation along paraspinals lumbar>thoracic spine  LOWER EXTREMITY ROM:     Active  Right Eval Left Eval  Hip flexion    Hip extension    Hip abduction    Hip adduction    Hip internal rotation    Hip external rotation    Knee flexion    Knee extension     Ankle dorsiflexion neutral 5 degrees  Ankle plantarflexion    Ankle inversion    Ankle eversion     (Blank rows = not tested)  LOWER EXTREMITY MMT:    MMT Right Eval Left Eval  Hip flexion 4 4  Hip extension    Hip abduction    Hip adduction    Hip internal rotation    Hip external rotation    Knee flexion 4 4  Knee extension 4 4  Ankle dorsiflexion 3+ 3+  Ankle plantarflexion    Ankle inversion    Ankle eversion    (Blank rows = not tested)  VITALS:   131/89 HR 84 in sitting   105/75 HR 87 in sitting    TRANSFERS: Sit to stand: SBA  Assistive device utilized: UE support at chair rails     Stand to sit: SBA  Assistive device utilized: UE support      GAIT: Findings: Gait Characteristics: step through pattern, decreased arm swing- Right, decreased arm swing- Left, decreased step length- Right,  decreased step length- Left, shuffling, festinating, and trunk flexed, Distance walked: 50 ft, Assistive device utilized:None, Level of assistance: SBA, and Comments: Fast pace, shuffling steps; gait is limited due to low back and coccyx pain, per pt report  FUNCTIONAL TESTS:  Timed up and go (TUG): 15.25 sec 10 meter walk test: 11.03 sec (2.97 ft/sec) TUG cognitive:  20.12 sec 3 M walk back:  attempted-only able to go 2 ft in 13.13 sec with hastening, decreased foot clearance 360 R:  11.46 sec in 22 steps 360 L:  11.25 sec in 23 steps                                                                                                                               TREATMENT DATE: 02/10/2024    PATIENT EDUCATION: Education details: Eval results, POC Person educated: Patient Education method: Explanation Education comprehension: verbalized understanding  HOME EXERCISE PROGRAM: Not yet initiated  GOALS: Goals reviewed with patient? Yes  SHORT TERM GOALS: Target date: 03/11/2024  Pt will be independent with HEP for improved back pain, posture, balance, gait. Baseline: Goal  status: MET  2.  Pt will improve TUG score to less than or equal to 13 sec for decreased fall risk. Baseline: >15 sec; 13 sec  Goal status: MET  3.  Pt will perform 360 turns R and L in 15 steps or less, for improved balance. Baseline: 22-23 steps; 8-10 steps Goal status: MET   4.  Pt will report at least 50% improvement in bed mobility.  Baseline: 0%  Goal status: NOT MET  LONG TERM GOALS: Target date: 04/08/2024  Pt will be independent with HEP for improved balance, gait. Baseline:  Goal status: MET  2.  Pt will improve TUG cognitive score to less than or equal to 15 sec for decreased fall risk.  Baseline: 20 sec; 21 sec Goal status: NOT MET  3.  Pt will perform 3 M backwards walk test in less than or equal to 15 seconds for improved balance. Baseline: unable to complete Goal status: N/A  4.  Pt will verbalize understanding of fall prevention in the home environment. Baseline: verbalizes relevant AD/AE Goal status: MET  5.  Pt will report at least 50% reduction in low back pain, to improve overall functional mobility Baseline: 4-8/10 Goal status: NOT MET  ASSESSMENT:  CLINICAL IMPRESSION: Able to meet TUG test goal but with deterioration of performance with dual tasking component. Improved turning 360 degrees from initial 22 steps to 8-10 steps for movement. During course of assessment pt reports increased lightheadedness and vital signs reveal orthostatic events. Provided several glasses of water  and seated resisted exercises which helped improve BP modestly.  Pt feeling improved to leave clinic without incident.  Pt will be transferring to ortho clinic for LBP tx.   OBJECTIVE IMPAIRMENTS: Abnormal gait, decreased balance, decreased mobility, difficulty walking, decreased strength, increased muscle spasms, postural dysfunction, and pain.   ACTIVITY LIMITATIONS:  bending, sitting, standing, transfers, bed mobility, hygiene/grooming, and locomotion level  PARTICIPATION  LIMITATIONS: cleaning, laundry, shopping, community activity, and exercise  PERSONAL FACTORS: 3+ comorbidities: see above are also affecting patient's functional outcome.   REHAB POTENTIAL: Good  CLINICAL DECISION MAKING: Evolving/moderate complexity  EVALUATION COMPLEXITY: Moderate  PLAN:  PT FREQUENCY: 1x/week (per patient request)  PT DURATION: 8 weeks plus eval  PLANNED INTERVENTIONS: 97750- Physical Performance Testing, 97110-Therapeutic exercises, 97530- Therapeutic activity, 97112- Neuromuscular re-education, 97535- Self Care, [id number]- Manual therapy, [phone]- Gait training, Patient/Family education, and Balance training  PLAN FOR NEXT SESSION: D/C assessment, pt is transferring to outpatient ortho for LBP tx   Jonette MARLA Sandifer, PT 04/05/2024, 1:13 PM  [facility] at [facility] [address], [address] [place], [place] [address] Phone # [phone] Fax # [phone]

## 2024-04-13 ENCOUNTER — Ambulatory Visit (HOSPITAL_BASED_OUTPATIENT_CLINIC_OR_DEPARTMENT_OTHER): Attending: Nurse Practitioner | Admitting: Physical Therapy

## 2024-04-13 ENCOUNTER — Encounter (HOSPITAL_BASED_OUTPATIENT_CLINIC_OR_DEPARTMENT_OTHER): Payer: Self-pay | Admitting: Physical Therapy

## 2024-04-13 DIAGNOSIS — R29818 Other symptoms and signs involving the nervous system: Secondary | ICD-10-CM | POA: Insufficient documentation

## 2024-04-13 DIAGNOSIS — M6281 Muscle weakness (generalized): Secondary | ICD-10-CM | POA: Diagnosis not present

## 2024-04-13 DIAGNOSIS — R29898 Other symptoms and signs involving the musculoskeletal system: Secondary | ICD-10-CM | POA: Insufficient documentation

## 2024-04-13 DIAGNOSIS — R269 Unspecified abnormalities of gait and mobility: Secondary | ICD-10-CM | POA: Insufficient documentation

## 2024-04-13 DIAGNOSIS — M5459 Other low back pain: Secondary | ICD-10-CM | POA: Insufficient documentation

## 2024-04-13 DIAGNOSIS — R278 Other lack of coordination: Secondary | ICD-10-CM | POA: Insufficient documentation

## 2024-04-13 NOTE — Therapy (Signed)
 OUTPATIENT PHYSICAL THERAPY THORACOLUMBAR TREATMENT   Patient Name: Ashley Neal MRN: [id number] DOB:04-12-1946, 78 y.o., female Today's Date: 04/14/2024  END OF SESSION:  PT End of Session - 04/13/24 1416     Visit Number 10   2 for lumbar PT   Number of Visits 17    Date for PT Re-Evaluation 04/08/24    Authorization Type [facility]    PT Start Time 1411    PT Stop Time 1450    PT Time Calculation (min) 39 min    Activity Tolerance Patient tolerated treatment well    Behavior During Therapy WFL for tasks assessed/performed          Past Medical History:  Diagnosis Date   Anxiety    Basal cell carcinoma    Depression    Hypertension    Neuromuscular disorder (HCC)    Osteoarthritis    Osteopenia    Parkinson disease (HCC)    Pneumonia    Tremors of nervous system    legs   uterine ca 10/08/2021   also endometrial cancer   Past Surgical History:  Procedure Laterality Date   CARPAL TUNNEL RELEASE Bilateral 1996/1997   CYSTOSCOPY N/A 10/08/2021   Procedure: CYSTOSCOPY;  Surgeon: Viktoria Comer SAUNDERS, MD;  Location: [facility];  Service: Gynecology;  Laterality: N/A;   ROBOTIC ASSISTED TOTAL HYSTERECTOMY WITH BILATERAL SALPINGO OOPHERECTOMY N/A 10/08/2021   Procedure: XI ROBOTIC ASSISTED TOTAL HYSTERECTOMY WITH BILATERAL SALPINGO OOPHORECTOMY;SENTINEL LYMPH NODE INJECTION;  Surgeon: Viktoria Comer SAUNDERS, MD;  Location: [facility];  Service: Gynecology;  Laterality: N/A;   SQUAMOUS CELL CARCINOMA EXCISION Left 2017   Left jawline   TUBAL LIGATION  1980's   Patient Active Problem List   Diagnosis Date Noted   Lumbar spondylosis 12/23/2023   Coccydynia 12/23/2023   Endometrial cancer (HCC) 10/16/2021   Complex atypical endometrial hyperplasia 09/27/2021   Primary osteoarthritis of both hands 05/14/2020   Primary osteoarthritis of both feet 05/14/2020   Age-related osteoporosis without current pathological fracture 05/14/2020   Prediabetes 05/14/2020   Essential  hypertension 05/14/2020   History of hyperlipidemia 05/14/2020   History of squamous cell carcinoma 05/14/2020   Parkinsonism (HCC) 05/14/2020     REFERRING PROVIDER:  [name], MD   REFERRING DIAG: [phone] (ICD-10-CM) - Lumbar spondylosis M53.3 (ICD-10-CM) - Coccydynia   Rationale for Evaluation and Treatment: Rehabilitation  THERAPY DIAG:  Muscle weakness (generalized)  Other symptoms and signs involving the nervous system  Other lack of coordination  Other symptoms and signs involving the musculoskeletal system  ONSET DATE: 2022 / PT order 12/23/2023  SUBJECTIVE:  SUBJECTIVE STATEMENT:   Pt denies any adverse effects after prior treatment.  Pt reports improved mobility and states she feels better after riding her recumbent bike.  Pt finished her neuro PT though is continuing with OT.  Pt received lumbar flexion with p-ball exercise for home exercise and states that really helps.   Pt reports having increased pain with walking.  She states she gets muscular contractions in her back and back feels very tight.  Pain is traveling up her back.   Pt states she does big and loud exercises for her parkinson's and the stretches can cause her pain.    PERTINENT HISTORY:  Chronic back pain, slight anterolisthesis L3-4, L4-L5 Parkinson's disease OA, Anxiety, HTN, uterine cancer, endometrial cancer, hernia   PAIN:  NPRS:  4-5/10 pain with activity Location:  coccyx region  PRECAUTIONS: Other: Parkinson's disease, slight anterolisthesis    WEIGHT BEARING RESTRICTIONS: No  FALLS:  Has patient fallen in last 6 months? Yes. Number of falls 2, at [employer] office when her leg gave way.  Fell at home when trying to get off of her sofa   LIVING ENVIRONMENT: Lives with: son lives with  her Lives in: one level home Stairs: 1 step to enter home with rail    PLOF: Independent  PATIENT GOALS: to be able to walk again without pain   OBJECTIVE:  Note: Objective measures were completed at Evaluation unless otherwise noted.  DIAGNOSTIC FINDINGS:  MRI in January of 2023 of her lumbar spine and pelvis which revealed the following.    L3-4: Mild disc bulging is asymmetric to the left. Mild facet hypertrophy is noted bilaterally. The central canal is patent. Mild left foraminal narrowing is present.   L4-5: A mild broad-based disc protrusion extends into the foramina bilaterally. Mild bilateral facet hypertrophy is noted. Mild foraminal narrowing is present bilaterally.   L5-S1: Minimal disc bulging is present. Mild left foraminal narrowing is noted.     1. No acute osseous injury of the pelvis. 2. Mild osteoarthritis of bilateral SI joints. 3. Partial-thickness tear of the gluteus medius tendon bilaterally. 4. Small partial-thickness tear of the left hamstring origin. 5. Broad-based disc bulge at L4-5 with bilateral mild facet arthropathy. Moderate bilateral facet arthropathy at L5-S1.  MRI in January in 2023:  FINDINGS: Segmentation: 5 non rib-bearing lumbar type vertebral bodies are present. The lowest fully formed vertebral body is L5.   Alignment:  Slight anterolisthesis is noted at L3-4 and L4-5.   Vertebrae: Scattered fatty infiltration of the marrow space is consistent with age. No focal lesions are present. Marrow signal and vertebral body heights are within normal limits.   Conus medullaris and cauda equina: Conus extends to the L1 level. Conus and cauda equina appear normal.   Paraspinal and other soft tissues: Limited imaging the abdomen is unremarkable. There is no significant adenopathy. No solid organ lesions are present.   Disc levels:   L1-2: Within normal limits.   L2-3: Within normal limits   L3-4: Mild disc bulging is asymmetric  to the left. Mild facet hypertrophy is noted bilaterally. The central canal is patent. Mild left foraminal narrowing is present.   L4-5: A mild broad-based disc protrusion extends into the foramina bilaterally. Mild bilateral facet hypertrophy is noted. Mild foraminal narrowing is present bilaterally.   L5-S1: Minimal disc bulging is present. Mild left foraminal narrowing is noted.   IMPRESSION: 1. Mild left foraminal narrowing at L3-4 and L5-S1. 2. Mild foraminal narrowing bilaterally at L4-5. 3.  Slight anterolisthesis at L3-4 and L4-5.   PATIENT SURVEYS:  Modified Oswestry:  MODIFIED OSWESTRY DISABILITY SCALE  Date: 03/16/2024 Score  Pain intensity 5 =  Pain medication has no effect on my pain.  2. Personal care (washing, dressing, etc.) 1 =  I can take care of myself normally, but it increases my pain.  3. Lifting 3 = Pain prevents me from lifting heavy weights, but I can manage (5) I have hardly any social life because of my pain. light to medium weights if they are conveniently positioned  4. Walking 3 =  Pain prevents me from walking more than  mile.  5. Sitting 3 =  Pain prevents me from sitting more than  hour.  6. Standing 4 =  Pain prevents me from standing more than 10 minutes.  7. Sleeping 0 = Pain does not prevent me from sleeping well.  8. Social Life 3 =  Pain prevents me from going out very often.  9. Traveling 4 = My pain restricts my travel to short necessary journeys under 1/2 hour.  10. Employment/ Homemaking 3 = Pain prevents me from doing anything but light duties.  Total 29/50 = 58%   Interpretation of scores: Score Category Description  0-20% Minimal Disability The patient can cope with most living activities. Usually no treatment is indicated apart from advice on lifting, sitting and exercise  21-40% Moderate Disability The patient experiences more pain and difficulty with sitting, lifting and standing. Travel and social life are more difficult and they  may be disabled from work. Personal care, sexual activity and sleeping are not grossly affected, and the patient can usually be managed by conservative means  41-60% Severe Disability Pain remains the main problem in this group, but activities of daily living are affected. These patients require a detailed investigation  61-80% Crippled Back pain impinges on all aspects of the patient's life. Positive intervention is required  81-100% Bed-bound  These patients are either bed-bound or exaggerating their symptoms  Bluford FORBES Zoe DELENA Karon DELENA, et al. Surgery versus conservative management of stable thoracolumbar fracture: the PRESTO feasibility RCT. [place]): [employer]; 2021 Nov. [facility] Technology Assessment, No. 25.62.) Appendix 3, Oswestry Disability Index category descriptors. Available from: FindJewel[employer].cz  Minimally Clinically Important Difference (MCID) = 12.8%  COGNITION: Overall cognitive status: Within functional limits for tasks assessed      PALPATION: Tremor in R hand  Pt has soft tissue tightness in bilat lumbar paraspinals.  Minimal tenderness on R sided lumbar which is less on L.   LUMBAR ROM:   AROM eval  Flexion WFL  Extension   Right lateral flexion 50%  Left lateral flexion 50%  Right rotation 40%  Left rotation 40%   (Blank rows = not tested)    LOWER EXTREMITY MMT:    MMT Right eval Left eval  Hip flexion 24.3 15.3  Hip extension    Hip abduction 23.2 16.1  Hip adduction    Hip internal rotation    Hip external rotation    Knee flexion    Knee extension 26.3 25.9  Ankle dorsiflexion    Ankle plantarflexion    Ankle inversion    Ankle eversion     (Blank rows = not tested)    GAIT: Assistive device utilized: None Comments:  fwd lean with gait, decreased arm swing bilat, decreased step length bilat  TREATMENT:  PT spent extensive time assisting pt with positioning on table.  PT instructed pt in and assisted pt with log rolling. Seated lumbar flexion with p-ball x 10 reps Supine lumbar rotation approx 15 reps Supine bridging 2x10 Supine PPT 2x10 Supine manual HS stretch 3x20-30 sec bilat  Pt received a HEP handout and was educated in correct form and appropriate frequency.  PT instructed pt she should not have pain with HEP.     PATIENT EDUCATION:  Education details: HEP, POC, current home exercises, bed mobility, relevant anatomy, exercise form, dx, and rationale of interventions.  Person educated: Patient Education method: Explanation, demonstration, verbal and tactile cues, handout Education comprehension: verbalized understanding and needs further education, verbal and tactile cues required, returned demonstration  HOME EXERCISE PROGRAM: Access Code: [email or web address] URL: [email or web address] Date: 04/13/2024 Prepared by: Mose Minerva  Exercises - Supine Bridge  - 1 x daily - 5-7 x weekly - 2 sets - 10 reps - Hooklying Lumbar Rotation  - 1-2 x daily - 7 x weekly - 2 sets - 10 reps - Supine Posterior Pelvic Tilt  - 1 x daily - 7 x weekly - 2 sets - 10 reps  ASSESSMENT:  CLINICAL IMPRESSION: Pt has completed neuro PT and is continuing with OT.  PT spent extensive time assisting pt with positioning on table.  PT instructed pt in and assisted pt with log rolling.  PT required assistance with bed mobility.  Pt will benefit from performing exercises on a larger table next visit.  Pt reports she has blood pressure changes with supine to/from sitting.  PT established HEP.  PT instructed pt in correct form and positioning with exercises for improved mobility, flexibility, and strength.  Pt performed exercises well with cuing and instruction.  She has limited height with supine bridge.  Pt tolerated treatment well and has no c/o's and no increased  pain after Rx.  She should benefit from skilled PT to address impairments and goals and improve overall function.  OBJECTIVE IMPAIRMENTS: Abnormal gait, decreased activity tolerance, decreased balance, decreased endurance, decreased mobility, difficulty walking, decreased ROM, decreased strength, hypomobility, increased fascial restrictions, and pain.   ACTIVITY LIMITATIONS: standing and locomotion level, sitting  PARTICIPATION LIMITATIONS: cleaning, shopping, and community activity  PERSONAL FACTORS: Time since onset of injury/illness/exacerbation and 1-2 comorbidities: Parkinson's, OA are also affecting patient's functional outcome.   REHAB POTENTIAL: Good  CLINICAL DECISION MAKING: Evolving/moderate complexity  EVALUATION COMPLEXITY: Moderate   GOALS:   SHORT TERM GOALS: Target date: 04/05/2024   Pt will be independent and compliant with HEP for improved pain, strength, ROM, and function.  Baseline: Goal status: INITIAL  2.  Pt will demo at least a 25% improvement in bilat Sb'and and rotation AROM for improved stiffness and daily mobility. Baseline:  Goal status: INITIAL  3.  Pt will report at least a 25% improvement in pain and sx's overall.   Baseline:  Goal status: INITIAL  4.  Pt will progress with core exercises without adverse effects for improved core and postural strength and improved performance of daily activities and daily mobility.  Baseline:  Goal status: INITIAL    LONG TERM GOALS: Target date: 05/10/2024  Pt will demo at least a 7 lb improvement in L hip flex and abd strength and a 5 lb improvement in bilat knee extension strength for improved performance of and tolerance with functional mobility.  Baseline:  Goal status: INITIAL  2.  Pt will report at least a 70% improvement in pain with ambulation.  Baseline:  Goal status: INITIAL  3.  Pt's Oswestry will be no > than 24% for improved self perceived disability and function. Baseline:  Goal  status: INITIAL  4.  Pt will deny L LE weakness with her daily ambulation.  Baseline:  Goal status: INITIAL    PLAN:  PT FREQUENCY: 1x/week  PT DURATION: 6 weeks-8 weeks  PLANNED INTERVENTIONS: 02835- PT Re-evaluation, 97750- Physical Performance Testing, 97110-Therapeutic exercises, 97530- Therapeutic activity, [id number]- Neuromuscular re-education, 97535- Self Care, [id number]- Manual therapy, [phone]- Gait training, [phone]- Aquatic Therapy, [phone]- Electrical stimulation (unattended), [phone]- Electrical stimulation (manual), [id number]- Ultrasound, Patient/Family education, Balance training, Stair training, Taping, Joint mobilization, Spinal mobilization, DME instructions, Cryotherapy, and Moist heat.  PLAN FOR NEXT SESSION: STM to lumbar paraspinals.  Core strength.  Review and perform HEP.  Leigh Minerva III PT, DPT 04/14/24 11:32 PM

## 2024-04-19 ENCOUNTER — Ambulatory Visit: Payer: Self-pay | Attending: Nurse Practitioner | Admitting: Occupational Therapy

## 2024-04-19 DIAGNOSIS — R2681 Unsteadiness on feet: Secondary | ICD-10-CM | POA: Insufficient documentation

## 2024-04-19 DIAGNOSIS — M6281 Muscle weakness (generalized): Secondary | ICD-10-CM | POA: Insufficient documentation

## 2024-04-19 DIAGNOSIS — R29818 Other symptoms and signs involving the nervous system: Secondary | ICD-10-CM | POA: Insufficient documentation

## 2024-04-19 DIAGNOSIS — R278 Other lack of coordination: Secondary | ICD-10-CM | POA: Insufficient documentation
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
## 2024-04-20 ENCOUNTER — Ambulatory Visit (HOSPITAL_BASED_OUTPATIENT_CLINIC_OR_DEPARTMENT_OTHER): Admitting: Physical Therapy

## 2024-04-20 DIAGNOSIS — R29898 Other symptoms and signs involving the musculoskeletal system: Secondary | ICD-10-CM

## 2024-04-20 DIAGNOSIS — M5459 Other low back pain: Secondary | ICD-10-CM

## 2024-04-20 DIAGNOSIS — M6281 Muscle weakness (generalized): Secondary | ICD-10-CM

## 2024-04-20 DIAGNOSIS — R269 Unspecified abnormalities of gait and mobility: Secondary | ICD-10-CM

## 2024-04-20 NOTE — Therapy (Unsigned)
 OUTPATIENT PHYSICAL THERAPY THORACOLUMBAR TREATMENT   Patient Name: Ashley Neal MRN: [id number] DOB:1946/05/28, 78 y.o., female Today's Date: 04/21/2024  END OF SESSION:  PT End of Session - 04/20/24 1413     Visit Number 11   3 for lumbar PT   Number of Visits 17    Date for PT Re-Evaluation 04/08/24    Authorization Type [facility]    PT Start Time 1409    PT Stop Time 1450    PT Time Calculation (min) 41 min    Activity Tolerance Patient tolerated treatment well    Behavior During Therapy WFL for tasks assessed/performed          Past Medical History:  Diagnosis Date   Anxiety    Basal cell carcinoma    Depression    Hypertension    Neuromuscular disorder (HCC)    Osteoarthritis    Osteopenia    Parkinson disease (HCC)    Pneumonia    Tremors of nervous system    legs   uterine ca 10/08/2021   also endometrial cancer   Past Surgical History:  Procedure Laterality Date   CARPAL TUNNEL RELEASE Bilateral 1996/1997   CYSTOSCOPY N/A 10/08/2021   Procedure: CYSTOSCOPY;  Surgeon: Viktoria Comer SAUNDERS, MD;  Location: [facility];  Service: Gynecology;  Laterality: N/A;   ROBOTIC ASSISTED TOTAL HYSTERECTOMY WITH BILATERAL SALPINGO OOPHERECTOMY N/A 10/08/2021   Procedure: XI ROBOTIC ASSISTED TOTAL HYSTERECTOMY WITH BILATERAL SALPINGO OOPHORECTOMY;SENTINEL LYMPH NODE INJECTION;  Surgeon: Viktoria Comer SAUNDERS, MD;  Location: [facility];  Service: Gynecology;  Laterality: N/A;   SQUAMOUS CELL CARCINOMA EXCISION Left 2017   Left jawline   TUBAL LIGATION  1980's   Patient Active Problem List   Diagnosis Date Noted   Lumbar spondylosis 12/23/2023   Coccydynia 12/23/2023   Endometrial cancer (HCC) 10/16/2021   Complex atypical endometrial hyperplasia 09/27/2021   Primary osteoarthritis of both hands 05/14/2020   Primary osteoarthritis of both feet 05/14/2020   Age-related osteoporosis without current pathological fracture 05/14/2020   Prediabetes 05/14/2020   Essential  hypertension 05/14/2020   History of hyperlipidemia 05/14/2020   History of squamous cell carcinoma 05/14/2020   Parkinsonism (HCC) 05/14/2020     REFERRING PROVIDER:  [name], MD   REFERRING DIAG: [id number] (ICD-10-CM) - Lumbar spondylosis M53.3 (ICD-10-CM) - Coccydynia   Rationale for Evaluation and Treatment: Rehabilitation  THERAPY DIAG:  Other low back pain  Muscle weakness (generalized)  Other symptoms and signs involving the musculoskeletal system  Abnormality of gait and mobility  ONSET DATE: 2022 / PT order 12/23/2023  SUBJECTIVE:  SUBJECTIVE STATEMENT:  Pt states she is in a lot of pain today.  Pt states she is not having a good day with her Parkinson's.  It is getting harder to get OOB in the AM due to Parkinson's.  Pt denies any adverse effects after prior Rx.  Pt states she is performing her Parkinson's exercises and her HEP.      PERTINENT HISTORY:  Chronic back pain, slight anterolisthesis L3-4, L4-L5 Parkinson's disease OA, Anxiety, HTN, uterine cancer, endometrial cancer, hernia   PAIN:  NPRS:  6/10 pain Location:  R > L sided lumbar  PRECAUTIONS: Other: Parkinson's disease, slight anterolisthesis    WEIGHT BEARING RESTRICTIONS: No  FALLS:  Has patient fallen in last 6 months? Yes. Number of falls 2, at [employer] office when her leg gave way.  Fell at home when trying to get off of her sofa   LIVING ENVIRONMENT: Lives with: son lives with her Lives in: one level home Stairs: 1 step to enter home with rail    PLOF: Independent  PATIENT GOALS: to be able to walk again without pain   OBJECTIVE:  Note: Objective measures were completed at Evaluation unless otherwise noted.  DIAGNOSTIC FINDINGS:  MRI in January of 2023 of her lumbar spine and pelvis  which revealed the following.    L3-4: Mild disc bulging is asymmetric to the left. Mild facet hypertrophy is noted bilaterally. The central canal is patent. Mild left foraminal narrowing is present.   L4-5: A mild broad-based disc protrusion extends into the foramina bilaterally. Mild bilateral facet hypertrophy is noted. Mild foraminal narrowing is present bilaterally.   L5-S1: Minimal disc bulging is present. Mild left foraminal narrowing is noted.     1. No acute osseous injury of the pelvis. 2. Mild osteoarthritis of bilateral SI joints. 3. Partial-thickness tear of the gluteus medius tendon bilaterally. 4. Small partial-thickness tear of the left hamstring origin. 5. Broad-based disc bulge at L4-5 with bilateral mild facet arthropathy. Moderate bilateral facet arthropathy at L5-S1.  MRI in January in 2023:  FINDINGS: Segmentation: 5 non rib-bearing lumbar type vertebral bodies are present. The lowest fully formed vertebral body is L5.   Alignment:  Slight anterolisthesis is noted at L3-4 and L4-5.   Vertebrae: Scattered fatty infiltration of the marrow space is consistent with age. No focal lesions are present. Marrow signal and vertebral body heights are within normal limits.   Conus medullaris and cauda equina: Conus extends to the L1 level. Conus and cauda equina appear normal.   Paraspinal and other soft tissues: Limited imaging the abdomen is unremarkable. There is no significant adenopathy. No solid organ lesions are present.   Disc levels:   L1-2: Within normal limits.   L2-3: Within normal limits   L3-4: Mild disc bulging is asymmetric to the left. Mild facet hypertrophy is noted bilaterally. The central canal is patent. Mild left foraminal narrowing is present.   L4-5: A mild broad-based disc protrusion extends into the foramina bilaterally. Mild bilateral facet hypertrophy is noted. Mild foraminal narrowing is present bilaterally.   L5-S1: Minimal  disc bulging is present. Mild left foraminal narrowing is noted.   IMPRESSION: 1. Mild left foraminal narrowing at L3-4 and L5-S1. 2. Mild foraminal narrowing bilaterally at L4-5. 3. Slight anterolisthesis at L3-4 and L4-5.   PATIENT SURVEYS:  Modified Oswestry:  MODIFIED OSWESTRY DISABILITY SCALE  Date: 03/16/2024 Score  Pain intensity 5 =  Pain medication has no effect on my pain.  2. Personal care (washing,  dressing, etc.) 1 =  I can take care of myself normally, but it increases my pain.  3. Lifting 3 = Pain prevents me from lifting heavy weights, but I can manage (5) I have hardly any social life because of my pain. light to medium weights if they are conveniently positioned  4. Walking 3 =  Pain prevents me from walking more than  mile.  5. Sitting 3 =  Pain prevents me from sitting more than  hour.  6. Standing 4 =  Pain prevents me from standing more than 10 minutes.  7. Sleeping 0 = Pain does not prevent me from sleeping well.  8. Social Life 3 =  Pain prevents me from going out very often.  9. Traveling 4 = My pain restricts my travel to short necessary journeys under 1/2 hour.  10. Employment/ Homemaking 3 = Pain prevents me from doing anything but light duties.  Total 29/50 = 58%   Interpretation of scores: Score Category Description  0-20% Minimal Disability The patient can cope with most living activities. Usually no treatment is indicated apart from advice on lifting, sitting and exercise  21-40% Moderate Disability The patient experiences more pain and difficulty with sitting, lifting and standing. Travel and social life are more difficult and they may be disabled from work. Personal care, sexual activity and sleeping are not grossly affected, and the patient can usually be managed by conservative means  41-60% Severe Disability Pain remains the main problem in this group, but activities of daily living are affected. These patients require a detailed investigation   61-80% Crippled Back pain impinges on all aspects of the patient's life. Positive intervention is required  81-100% Bed-bound  These patients are either bed-bound or exaggerating their symptoms  Bluford FORBES Zoe DELENA Karon DELENA, et al. Surgery versus conservative management of stable thoracolumbar fracture: the PRESTO feasibility RCT. [place]): [employer]; 2021 Nov. [facility] Technology Assessment, No. 25.62.) Appendix 3, Oswestry Disability Index category descriptors. Available from: FindJewelers.cz  Minimally Clinically Important Difference (MCID) = 12.8%  COGNITION: Overall cognitive status: Within functional limits for tasks assessed      PALPATION: Tremor in R hand  Pt has soft tissue tightness in bilat lumbar paraspinals.  Minimal tenderness on R sided lumbar which is less on L.   LUMBAR ROM:   AROM eval  Flexion WFL  Extension   Right lateral flexion 50%  Left lateral flexion 50%  Right rotation 40%  Left rotation 40%   (Blank rows = not tested)    LOWER EXTREMITY MMT:    MMT Right eval Left eval  Hip flexion 24.3 15.3  Hip extension    Hip abduction 23.2 16.1  Hip adduction    Hip internal rotation    Hip external rotation    Knee flexion    Knee extension 26.3 25.9  Ankle dorsiflexion    Ankle plantarflexion    Ankle inversion    Ankle eversion     (Blank rows = not tested)    GAIT: Assistive device utilized: None Comments:  fwd lean with gait, decreased arm swing bilat, decreased step length bilat  TREATMENT:  04/20/24:  Standing heel raises 2x10 Sidestepping with bilat UE support x 2 laps Pt states she couldn't march PT spent extensive time assisting pt with positioning on table.  PT instructed pt in and assisted pt with log rolling. Seated lumbar flexion with p-ball x 8 reps  fwd and x 5 reps laterally  STM to bilat lumbar paraspinals in L S/L'ing with pillow b/w knees.     PATIENT EDUCATION:  Education details: HEP, POC, current home exercises, bed mobility, relevant anatomy, exercise form, dx, and rationale of interventions.  Person educated: Patient Education method: Explanation, demonstration, verbal and tactile cues, handout Education comprehension: verbalized understanding and needs further education, verbal and tactile cues required, returned demonstration  HOME EXERCISE PROGRAM: Access Code: [email or web address] URL: [email or web address] Date: 04/13/2024 Prepared by: Mose Minerva  Exercises - Supine Bridge  - 1 x daily - 5-7 x weekly - 2 sets - 10 reps - Hooklying Lumbar Rotation  - 1-2 x daily - 7 x weekly - 2 sets - 10 reps - Supine Posterior Pelvic Tilt  - 1 x daily - 7 x weekly - 2 sets - 10 reps  ASSESSMENT:  CLINICAL IMPRESSION: Pt presents to treatment stating she is in a lot of pain today and she is not having a good day with her Parkinson's.  Pt was very slow with mobility and had greater difficulty with mobility today.  She was much slower with initiating movement and had freezing episodes.  Pt required assistance with bed mobility and PT spent time assisting pt with positioning on table.  Pt became nauseated with performing seated lumbar flexion with p-ball.  PT had pt perform standing exercises to improve mobility, strength, and stiffness.  Pt was unable to perform marching and was limited in exercises today.  PT performed STM to lumbar paraspinals to improve soft tissue tightness and mobility and pain and to reduce myofascial restrictions.  PT performed manual therapy on the larger table today.  Pt had no increased pain after Rx.  She should benefit from continued skilled PT to address impairments and goals and improve overall function.   OBJECTIVE IMPAIRMENTS: Abnormal gait, decreased activity tolerance, decreased balance, decreased  endurance, decreased mobility, difficulty walking, decreased ROM, decreased strength, hypomobility, increased fascial restrictions, and pain.   ACTIVITY LIMITATIONS: standing and locomotion level, sitting  PARTICIPATION LIMITATIONS: cleaning, shopping, and community activity  PERSONAL FACTORS: Time since onset of injury/illness/exacerbation and 1-2 comorbidities: Parkinson's, OA are also affecting patient's functional outcome.   REHAB POTENTIAL: Good  CLINICAL DECISION MAKING: Evolving/moderate complexity  EVALUATION COMPLEXITY: Moderate   GOALS:   SHORT TERM GOALS: Target date: 04/05/2024   Pt will be independent and compliant with HEP for improved pain, strength, ROM, and function.  Baseline: Goal status: INITIAL  2.  Pt will demo at least a 25% improvement in bilat Sb'and and rotation AROM for improved stiffness and daily mobility. Baseline:  Goal status: INITIAL  3.  Pt will report at least a 25% improvement in pain and sx's overall.   Baseline:  Goal status: INITIAL  4.  Pt will progress with core exercises without adverse effects for improved core and postural strength and improved performance of daily activities and daily mobility.  Baseline:  Goal status: INITIAL    LONG TERM GOALS: Target date: 05/10/2024  Pt will demo at least a 7 lb improvement in L hip flex and abd strength and a 5 lb improvement in bilat knee extension strength for improved performance of and tolerance with functional  mobility.  Baseline:  Goal status: INITIAL  2.  Pt will report at least a 70% improvement in pain with ambulation.   Baseline:  Goal status: INITIAL  3.  Pt's Oswestry will be no > than 24% for improved self perceived disability and function. Baseline:  Goal status: INITIAL  4.  Pt will deny L LE weakness with her daily ambulation.  Baseline:  Goal status: INITIAL    PLAN:  PT FREQUENCY: 1x/week  PT DURATION: 6 weeks-8 weeks  PLANNED INTERVENTIONS: 02835- PT  Re-evaluation, 97750- Physical Performance Testing, 97110-Therapeutic exercises, 97530- Therapeutic activity, [id number]- Neuromuscular re-education, 97535- Self Care, 02859- Manual therapy, [phone]- Gait training, [phone]- Aquatic Therapy, [phone]- Electrical stimulation (unattended), [phone]- Electrical stimulation (manual), [id number]- Ultrasound, Patient/Family education, Balance training, Stair training, Taping, Joint mobilization, Spinal mobilization, DME instructions, Cryotherapy, and Moist heat.  PLAN FOR NEXT SESSION: STM to lumbar paraspinals.  Core strength.  Review and perform HEP.  Leigh Minerva III PT, DPT 04/22/24 1:06 AM

## 2024-04-21 ENCOUNTER — Encounter (HOSPITAL_BASED_OUTPATIENT_CLINIC_OR_DEPARTMENT_OTHER): Payer: Self-pay | Admitting: Physical Therapy

## 2024-04-21 ENCOUNTER — Institutional Professional Consult (permissible substitution): Admitting: Neurology

## 2024-04-25 ENCOUNTER — Other Ambulatory Visit: Payer: Self-pay

## 2024-04-25 NOTE — Patient Outreach (Addendum)
 Complex Care Management   Visit Note  04/25/2024  Name:  Ashley Neal MRN: [id number] DOB: 11/11/1945  Situation: Referral received for Complex Care Management related to Parkinson's disease; lumbar radiculopathy; Orthostatic Hypotension, Falls. I obtained verbal consent from Patient.  Visit completed with Patient  on the phone.  Background:   Past Medical History:  Diagnosis Date   Anxiety    Basal cell carcinoma    Depression    Hypertension    Neuromuscular disorder (HCC)    Osteoarthritis    Osteopenia    Parkinson disease (HCC)    Pneumonia    Tremors of nervous system    legs   uterine ca 10/08/2021   also endometrial cancer    Assessment: Patient Reported Symptoms:  Cognitive Cognitive Status: Alert and oriented to person, place, and time, Normal speech and language skills Cognitive/Intellectual Conditions Management [RPT]: None reported or documented in medical history or problem list   Health Maintenance Behaviors: Annual physical exam, Exercise, Healthy diet, Sleep adequate Health Facilitated by: Rest, Healthy diet, Pain control  Neurological Neurological Review of Symptoms: Other: Oher Neurological Symptoms/Conditions [RPT]: tremors, dyskensia, hallucitory nightmares, psuedobulbar affect    HEENT HEENT Symptoms Reported: No symptoms reported      Cardiovascular Cardiovascular Symptoms Reported: No symptoms reported    Respiratory Respiratory Symptoms Reported: No symptoms reported    Endocrine Endocrine Symptoms Reported: No symptoms reported    Gastrointestinal Gastrointestinal Symptoms Reported: No symptoms reported      Genitourinary Genitourinary Symptoms Reported: Frequency Genitourinary Management Strategies: Fluid modification Genitourinary Self-Management Outcome: 3 (uncertain)  Integumentary Integumentary Symptoms Reported: No symptoms reported    Musculoskeletal Musculoskelatal Symptoms Reviewed: Back pain, Difficulty walking, Joint pain,  Muscle pain, Limited mobility, Unsteady gait Musculoskeletal Management Strategies: Adequate rest, Medical device, Medication therapy, Routine screening, Exercise Musculoskeletal Self-Management Outcome: 4 (good) Falls in the past year?: Yes Number of falls in past year: 1 or less Was there an injury with Fall?: Yes Fall Risk Category Calculator: 2 Patient Fall Risk Level: Moderate Fall Risk Patient at Risk for Falls Due to: History of fall(s), Impaired balance/gait, Impaired mobility Fall risk Follow up: Falls evaluation completed, Education provided, Falls prevention discussed  Psychosocial Psychosocial Symptoms Reported: No symptoms reported   Major Change/Loss/Stressor/Fears (CP): Medical condition, self Techniques to Cope with Loss/Stress/Change: Medication Quality of Family Relationships: supportive, involved, helpful Do you feel physically threatened by others?: No    04/25/2024    PHQ2-9 Depression Screening   Milbern Doescher interest or pleasure in doing things    Feeling down, depressed, or hopeless    PHQ-2 - Total Score    Trouble falling or staying asleep, or sleeping too much    Feeling tired or having Tamaj Jurgens energy    Poor appetite or overeating     Feeling bad about yourself - or that you are a failure or have let yourself or your family down    Trouble concentrating on things, such as reading the newspaper or watching television    Moving or speaking so slowly that other people could have noticed.  Or the opposite - being so fidgety or restless that you have been moving around a lot more than usual    Thoughts that you would be better off dead, or hurting yourself in some way    PHQ2-9 Total Score    If you checked off any problems, how difficult have these problems made it for you to do your work, take care of things at home, or  get along with other people    Depression Interventions/Treatment      There were no vitals filed for this visit.  Medications Reviewed Today      Reviewed by Morgan Clayborne CROME, RN (Registered Nurse) on 04/25/24 at 1232  Med List Status: <None>   Medication Order Taking? Sig Documenting Provider Last Dose Status Informant  acetaminophen  (TYLENOL ) 500 MG tablet [id number]  Take 500 mg by mouth every 6 (six) hours as needed for moderate pain (pain score 4-6). [provider]  Active   caffeine 200 MG TABS tablet [id number]  Take 200 mg by mouth daily.  Patient not taking: Reported on 03/17/2024   [provider]  Active            Med Note (   Tue Feb 09, 2024  2:45 PM) To raise BP per pt  carbidopa -levodopa  (SINEMET  IR) 25-100 MG tablet [id number]  Take 1 tablet by mouth 5 (five) times daily. [provider]  Active   escitalopram (LEXAPRO) 20 MG tablet [id number]  Take 20 mg by mouth daily. [provider]  Active   ibuprofen (ADVIL) 200 MG tablet [id number]  Take 200 mg by mouth every 6 (six) hours as needed for moderate pain (pain score 4-6). [provider]  Active   Multiple Vitamin (MULTI-VITAMIN) tablet [id number]  Take 1 tablet by mouth daily. [provider]  Active             Recommendation:   PCP Follow-up on 05/02/24 at 11:20 AM Specialty provider follow-up with Dr. Buck, Neurologist on 05/11/24 at 10:15 AM Continue Current Plan of Care  Follow Up Plan:   Telephone follow up appointment date/time:  Thursday, October 9 at 1:00 PM  Clayborne Morgan RN BSN CCM [place]  [facility], [facility] Health Nurse Care Coordinator  Direct Dial: [phone] Website: [email or web address]

## 2024-04-25 NOTE — Patient Instructions (Signed)
 Visit Information  Thank you for taking time to visit with me today. Please don't hesitate to contact me if I can be of assistance to you before our next scheduled appointment.  Your next care management appointment is by telephone on Thursday, October 9 at 1:00 PM  Please call the care guide team at [phone] if you need to cancel, schedule, or reschedule an appointment.   Please call [phone]-TALK (toll free, 24 hour hotline) if you are experiencing a Mental Health or Behavioral Health Crisis or need someone to talk to.  Clayborne Ly RN BSN CCM [place]  [facility], [facility] Health Nurse Care Coordinator  Direct Dial: [phone] Website: [email or web address]

## 2024-04-26 ENCOUNTER — Ambulatory Visit: Payer: Self-pay | Admitting: Occupational Therapy

## 2024-04-26 DIAGNOSIS — R29818 Other symptoms and signs involving the nervous system: Secondary | ICD-10-CM

## 2024-04-26 DIAGNOSIS — M6281 Muscle weakness (generalized): Secondary | ICD-10-CM

## 2024-04-26 NOTE — Patient Instructions (Addendum)
 [email or web address]  [email or web address]
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
## 2024-04-27 ENCOUNTER — Ambulatory Visit (HOSPITAL_BASED_OUTPATIENT_CLINIC_OR_DEPARTMENT_OTHER): Admitting: Physical Therapy

## 2024-04-27 DIAGNOSIS — M6281 Muscle weakness (generalized): Secondary | ICD-10-CM | POA: Diagnosis not present

## 2024-04-27 DIAGNOSIS — R269 Unspecified abnormalities of gait and mobility: Secondary | ICD-10-CM

## 2024-04-27 DIAGNOSIS — R29898 Other symptoms and signs involving the musculoskeletal system: Secondary | ICD-10-CM

## 2024-04-27 DIAGNOSIS — M5459 Other low back pain: Secondary | ICD-10-CM

## 2024-04-27 NOTE — Therapy (Signed)
 OUTPATIENT PHYSICAL THERAPY THORACOLUMBAR TREATMENT   Patient Name: Ashley Neal MRN: [id number] DOB:23-Jul-1946, 78 y.o., female Today's Date: 04/28/2024  END OF SESSION:  PT End of Session - 04/28/24 1150     Visit Number 12   4 for lumbar PT   Number of Visits 17    Date for Recertification  04/08/24    Authorization Type [facility]    PT Start Time 1406    PT Stop Time 1449    PT Time Calculation (min) 43 min    Activity Tolerance Patient tolerated treatment well    Behavior During Therapy WFL for tasks assessed/performed           Past Medical History:  Diagnosis Date   Anxiety    Basal cell carcinoma    Depression    Hypertension    Neuromuscular disorder (HCC)    Osteoarthritis    Osteopenia    Parkinson disease (HCC)    Pneumonia    Tremors of nervous system    legs   uterine ca 10/08/2021   also endometrial cancer   Past Surgical History:  Procedure Laterality Date   CARPAL TUNNEL RELEASE Bilateral 1996/1997   CYSTOSCOPY N/A 10/08/2021   Procedure: CYSTOSCOPY;  Surgeon: Viktoria Comer SAUNDERS, MD;  Location: [facility];  Service: Gynecology;  Laterality: N/A;   ROBOTIC ASSISTED TOTAL HYSTERECTOMY WITH BILATERAL SALPINGO OOPHERECTOMY N/A 10/08/2021   Procedure: XI ROBOTIC ASSISTED TOTAL HYSTERECTOMY WITH BILATERAL SALPINGO OOPHORECTOMY;SENTINEL LYMPH NODE INJECTION;  Surgeon: Viktoria Comer SAUNDERS, MD;  Location: [facility];  Service: Gynecology;  Laterality: N/A;   SQUAMOUS CELL CARCINOMA EXCISION Left 2017   Left jawline   TUBAL LIGATION  1980's   Patient Active Problem List   Diagnosis Date Noted   Lumbar spondylosis 12/23/2023   Coccydynia 12/23/2023   Endometrial cancer (HCC) 10/16/2021   Complex atypical endometrial hyperplasia 09/27/2021   Primary osteoarthritis of both hands 05/14/2020   Primary osteoarthritis of both feet 05/14/2020   Age-related osteoporosis without current pathological fracture 05/14/2020   Prediabetes 05/14/2020   Essential  hypertension 05/14/2020   History of hyperlipidemia 05/14/2020   History of squamous cell carcinoma 05/14/2020   Parkinsonism (HCC) 05/14/2020     REFERRING PROVIDER:  [name], MD   REFERRING DIAG: [phone] (ICD-10-CM) - Lumbar spondylosis M53.3 (ICD-10-CM) - Coccydynia   Rationale for Evaluation and Treatment: Rehabilitation  THERAPY DIAG:  Muscle weakness (generalized)  Other low back pain  Other symptoms and signs involving the musculoskeletal system  Abnormality of gait and mobility  ONSET DATE: 2022 / PT order 12/23/2023  SUBJECTIVE:  SUBJECTIVE STATEMENT:  Pt states she is in a lot of pain.  Pt has coccyx pain with sitting and standing.  Pt reports no improvement in lumbar and coccyx pain.  Pt reports compliance with HEP.  Pt is using a lower body ergometer and performs big and loud exercises.  Pt states she's had pain for 3 years and nothing has helped.  Pt denies any adverse effects after prior Rx.      PERTINENT HISTORY:  Chronic back pain, slight anterolisthesis L3-4, L4-L5 Parkinson's disease OA, Anxiety, HTN, uterine cancer, endometrial cancer, hernia   PAIN:  NPRS:        4-5/10 pain  /  7/10 Location:  R > L  sided lumbar  /  coccyx  PRECAUTIONS: Other: Parkinson's disease, slight anterolisthesis    WEIGHT BEARING RESTRICTIONS: No  FALLS:  Has patient fallen in last 6 months? Yes. Number of falls 2, at post office when her leg gave way.  Fell at home when trying to get off of her sofa   LIVING ENVIRONMENT: Lives with: son lives with her Lives in: one level home Stairs: 1 step to enter home with rail    PLOF: Independent  PATIENT GOALS: to be able to walk again without pain   OBJECTIVE:  Note: Objective measures were completed at Evaluation unless  otherwise noted.  DIAGNOSTIC FINDINGS:  MRI in January of 2023 of her lumbar spine and pelvis which revealed the following.    L3-4: Mild disc bulging is asymmetric to the left. Mild facet hypertrophy is noted bilaterally. The central canal is patent. Mild left foraminal narrowing is present.   L4-5: A mild broad-based disc protrusion extends into the foramina bilaterally. Mild bilateral facet hypertrophy is noted. Mild foraminal narrowing is present bilaterally.   L5-S1: Minimal disc bulging is present. Mild left foraminal narrowing is noted.     1. No acute osseous injury of the pelvis. 2. Mild osteoarthritis of bilateral SI joints. 3. Partial-thickness tear of the gluteus medius tendon bilaterally. 4. Small partial-thickness tear of the left hamstring origin. 5. Broad-based disc bulge at L4-5 with bilateral mild facet arthropathy. Moderate bilateral facet arthropathy at L5-S1.  MRI in January in 2023:  FINDINGS: Segmentation: 5 non rib-bearing lumbar type vertebral bodies are present. The lowest fully formed vertebral body is L5.   Alignment:  Slight anterolisthesis is noted at L3-4 and L4-5.   Vertebrae: Scattered fatty infiltration of the marrow space is consistent with age. No focal lesions are present. Marrow signal and vertebral body heights are within normal limits.   Conus medullaris and cauda equina: Conus extends to the L1 level. Conus and cauda equina appear normal.   Paraspinal and other soft tissues: Limited imaging the abdomen is unremarkable. There is no significant adenopathy. No solid organ lesions are present.   Disc levels:   L1-2: Within normal limits.   L2-3: Within normal limits   L3-4: Mild disc bulging is asymmetric to the left. Mild facet hypertrophy is noted bilaterally. The central canal is patent. Mild left foraminal narrowing is present.   L4-5: A mild broad-based disc protrusion extends into the foramina bilaterally. Mild bilateral  facet hypertrophy is noted. Mild foraminal narrowing is present bilaterally.   L5-S1: Minimal disc bulging is present. Mild left foraminal narrowing is noted.   IMPRESSION: 1. Mild left foraminal narrowing at L3-4 and L5-S1. 2. Mild foraminal narrowing bilaterally at L4-5. 3. Slight anterolisthesis at L3-4 and L4-5.   PATIENT SURVEYS:  Modified Oswestry:  MODIFIED OSWESTRY DISABILITY SCALE  Date: 03/16/2024 Score  Pain intensity 5 =  Pain medication has no effect on my pain.  2. Personal care (washing, dressing, etc.) 1 =  I can take care of myself normally, but it increases my pain.  3. Lifting 3 = Pain prevents me from lifting heavy weights, but I can manage (5) I have hardly any social life because of my pain. light to medium weights if they are conveniently positioned  4. Walking 3 =  Pain prevents me from walking more than  mile.  5. Sitting 3 =  Pain prevents me from sitting more than  hour.  6. Standing 4 =  Pain prevents me from standing more than 10 minutes.  7. Sleeping 0 = Pain does not prevent me from sleeping well.  8. Social Life 3 =  Pain prevents me from going out very often.  9. Traveling 4 = My pain restricts my travel to short necessary journeys under 1/2 hour.  10. Employment/ Homemaking 3 = Pain prevents me from doing anything but light duties.  Total 29/50 = 58%   Interpretation of scores: Score Category Description  0-20% Minimal Disability The patient can cope with most living activities. Usually no treatment is indicated apart from advice on lifting, sitting and exercise  21-40% Moderate Disability The patient experiences more pain and difficulty with sitting, lifting and standing. Travel and social life are more difficult and they may be disabled from work. Personal care, sexual activity and sleeping are not grossly affected, and the patient can usually be managed by conservative means  41-60% Severe Disability Pain remains the main problem in this group, but  activities of daily living are affected. These patients require a detailed investigation  61-80% Crippled Back pain impinges on all aspects of the patient's life. Positive intervention is required  81-100% Bed-bound  These patients are either bed-bound or exaggerating their symptoms  Bluford FORBES Zoe DELENA Karon DELENA, et al. Surgery versus conservative management of stable thoracolumbar fracture: the PRESTO feasibility RCT. [place] [employer]; 2021 Nov. [facility] Technology Assessment, No. 25.62.) Appendix 3, Oswestry Disability Index category descriptors. Available from: FindJewelers.cz  Minimally Clinically Important Difference (MCID) = 12.8%  COGNITION: Overall cognitive status: Within functional limits for tasks assessed      PALPATION: Tremor in R hand  Pt has soft tissue tightness in bilat lumbar paraspinals.  Minimal tenderness on R sided lumbar which is less on L.   LUMBAR ROM:   AROM eval  Flexion WFL  Extension   Right lateral flexion 50%  Left lateral flexion 50%  Right rotation 40%  Left rotation 40%   (Blank rows = not tested)    LOWER EXTREMITY MMT:    MMT Right eval Left eval  Hip flexion 24.3 15.3  Hip extension    Hip abduction 23.2 16.1  Hip adduction    Hip internal rotation    Hip external rotation    Knee flexion    Knee extension 26.3 25.9  Ankle dorsiflexion    Ankle plantarflexion    Ankle inversion    Ankle eversion     (Blank rows = not tested)    GAIT: Assistive device utilized: None Comments:  fwd lean with gait, decreased arm swing bilat, decreased step length bilat  TREATMENT:  04/27/2024: Supine PPT 2x10 Supine bridge 2x10 Supine lumbar rotation 2x10 Supine marching with TrA 2x10 Seated lumbar flexion with  p-ball x 10 reps Standing heel raises  2x10 Sidestepping with bilat UE support x 2 laps Standing marching with UE support       PATIENT EDUCATION:  Education details: HEP, POC, bed mobility, relevant anatomy, exercise form, dx, and rationale of interventions.  Person educated: Patient Education method: Explanation, demonstration, verbal and tactile cues Education comprehension: verbalized understanding and needs further education, verbal and tactile cues required, returned demonstration  HOME EXERCISE PROGRAM: Access Code: [email or web address] URL: [email or web address] Date: 04/13/2024 Prepared by: Mose Minerva  Exercises - Supine Bridge  - 1 x daily - 5-7 x weekly - 2 sets - 10 reps - Hooklying Lumbar Rotation  - 1-2 x daily - 7 x weekly - 2 sets - 10 reps - Supine Posterior Pelvic Tilt  - 1 x daily - 7 x weekly - 2 sets - 10 reps  ASSESSMENT:  CLINICAL IMPRESSION: Pt continues to c/o of being in a lot of pain.  She reports having pain for 3 years and has not seen any improvement.  PT assisted pt with bed mobility and pt had improved mobility on the table today.  Pt did not have freezing episodes today.  Pt performed exercises well with cuing for correct form.  She had improved tolerance with standing exercises as evidenced by performance of standing marching.  Pt responded well to treatment having no increased pain after treatment.  She should benefit from continued skilled PT to address impairments and goals and improve overall function.   OBJECTIVE IMPAIRMENTS: Abnormal gait, decreased activity tolerance, decreased balance, decreased endurance, decreased mobility, difficulty walking, decreased ROM, decreased strength, hypomobility, increased fascial restrictions, and pain.   ACTIVITY LIMITATIONS: standing and locomotion level, sitting  PARTICIPATION LIMITATIONS: cleaning, shopping, and community activity  PERSONAL FACTORS: Time since onset of injury/illness/exacerbation and 1-2 comorbidities: Parkinson's, OA  are also affecting patient's functional outcome.   REHAB POTENTIAL: Good  CLINICAL DECISION MAKING: Evolving/moderate complexity  EVALUATION COMPLEXITY: Moderate   GOALS:   SHORT TERM GOALS: Target date: 04/05/2024   Pt will be independent and compliant with HEP for improved pain, strength, ROM, and function.  Baseline: Goal status: INITIAL  2.  Pt will demo at least a 25% improvement in bilat Sb'and and rotation AROM for improved stiffness and daily mobility. Baseline:  Goal status: INITIAL  3.  Pt will report at least a 25% improvement in pain and sx's overall.   Baseline:  Goal status: INITIAL  4.  Pt will progress with core exercises without adverse effects for improved core and postural strength and improved performance of daily activities and daily mobility.  Baseline:  Goal status: INITIAL    LONG TERM GOALS: Target date: 05/10/2024  Pt will demo at least a 7 lb improvement in L hip flex and abd strength and a 5 lb improvement in bilat knee extension strength for improved performance of and tolerance with functional mobility.  Baseline:  Goal status: INITIAL  2.  Pt will report at least a 70% improvement in pain with ambulation.   Baseline:  Goal status: INITIAL  3.  Pt's Oswestry will be no > than 24% for improved self perceived disability and function. Baseline:  Goal status: INITIAL  4.  Pt will deny L LE weakness with her daily ambulation.  Baseline:  Goal status: INITIAL    PLAN:  PT FREQUENCY: 1x/week  PT DURATION: 6 weeks-8  weeks  PLANNED INTERVENTIONS: 97164- PT Re-evaluation, 97750- Physical Performance Testing, 97110-Therapeutic exercises, 97530- Therapeutic activity, [id number]- Neuromuscular re-education, [phone]- Self Care, 02859- Manual therapy, [phone]- Gait training, [phone]- Aquatic Therapy, [phone]- Electrical stimulation (unattended), [phone]- Electrical stimulation (manual), [id number]- Ultrasound, Patient/Family education, Balance training, Stair  training, Taping, Joint mobilization, Spinal mobilization, DME instructions, Cryotherapy, and Moist heat.  PLAN FOR NEXT SESSION: STM to lumbar paraspinals.  Core strength.  Review and perform HEP.  Leigh Minerva III PT, DPT 04/28/24 1:09 PM

## 2024-04-28 ENCOUNTER — Encounter (HOSPITAL_BASED_OUTPATIENT_CLINIC_OR_DEPARTMENT_OTHER): Payer: Self-pay | Admitting: Physical Therapy

## 2024-05-01 DIAGNOSIS — M5440 Lumbago with sciatica, unspecified side: Secondary | ICD-10-CM | POA: Diagnosis not present

## 2024-05-01 DIAGNOSIS — G20A1 Parkinson's disease without dyskinesia, without mention of fluctuations: Secondary | ICD-10-CM | POA: Diagnosis not present

## 2024-05-02 ENCOUNTER — Ambulatory Visit (INDEPENDENT_AMBULATORY_CARE_PROVIDER_SITE_OTHER): Admitting: Nurse Practitioner

## 2024-05-02 ENCOUNTER — Encounter: Payer: Self-pay | Admitting: Nurse Practitioner

## 2024-05-02 VITALS — BP 130/84 | HR 87 | Temp 97.5°F | Ht 61.0 in | Wt 161.0 lb

## 2024-05-02 DIAGNOSIS — G20A1 Parkinson's disease without dyskinesia, without mention of fluctuations: Secondary | ICD-10-CM | POA: Diagnosis not present

## 2024-05-02 DIAGNOSIS — N3281 Overactive bladder: Secondary | ICD-10-CM

## 2024-05-02 DIAGNOSIS — G8929 Other chronic pain: Secondary | ICD-10-CM | POA: Diagnosis not present

## 2024-05-02 DIAGNOSIS — I951 Orthostatic hypotension: Secondary | ICD-10-CM | POA: Diagnosis not present

## 2024-05-02 DIAGNOSIS — F419 Anxiety disorder, unspecified: Secondary | ICD-10-CM

## 2024-05-02 DIAGNOSIS — M5440 Lumbago with sciatica, unspecified side: Secondary | ICD-10-CM

## 2024-05-02 NOTE — Progress Notes (Signed)
 Careteam: Patient Care Team: Caro Harlene POUR, NP as PCP - General (Geriatric Medicine) Dann Candyce RAMAN, MD as PCP - Cardiology (Cardiology) Morgan, Clayborne CROME, RN as [facility]  PLACE OF SERVICE:  [facility]  Advanced Directive information    Allergies  Allergen Reactions   Gabapentin Other (See Comments)    Ringing in ears   Hydrocodone -Acetaminophen      Dizzy, ringing in ears  Ineffective for pain  Dizzy, ringing in ears Ineffective for pain   Lyrica  [Pregabalin ]     Dizziness and ringing in ears    Codeine Other (See Comments)    Nausea      HPI:  Patient is a 78 y.o. female seen in today with Parkinson's Disease for a 4 month follow up visit.    She continues to experience tremors, stiffness, orthostatic hypotension. States blood pressures have gotten as low as 80/40s while getting out of bed. She endorses difficultly getting out bed and slowed movements. Her son helps with activity of daily living. He helps her get dressed, while she can feed her self, she needs help with set. She is still able to drive. Drives independently to appointments in [facility] but unable to continue the drive. She continues to take Sinemet  which no associated complications.  Patient expresses concerns of progressing into incontinence due to slowed movement. She discussed initiating use of purwick. She states rates are about $600.   She states pain to coccyx has resolved but she continues to have muscle spasms in lumbar area. Pain 10/10 most days. She uses heating pads and advil as needed. She did use a Xanax  once. She states, I slept for 3 days but it helped with the pain. She continues to do home exercises and attends PT at [facility]. She uses no walker or cane for mobility. patient asked about starting Robaxin  for muscle spasms.  She has interviewed different home services for assistance but services are costly.   Lexapro continues to be used  for mood management. She endorses some anxiety with occasional crying spells but no depression. Social interactions have been limited to due parkison's symptoms. She no longer attends dance classes but does participate in trivia night.   She has recently attended her appointment for a mammogram and eye exam.   Review of Systems:  Review of Systems  Constitutional:  Negative for chills, diaphoresis, fever and malaise/fatigue.  HENT:  Negative for ear pain, hearing loss and tinnitus.   Eyes:  Negative for blurred vision, double vision and photophobia.  Respiratory:  Negative for cough, shortness of breath and wheezing.   Cardiovascular:  Negative for chest pain, palpitations, orthopnea and leg swelling.  Gastrointestinal:  Negative for abdominal pain, constipation, diarrhea, nausea and vomiting.  Genitourinary:  Negative for dysuria, frequency and urgency.  Musculoskeletal:  Positive for back pain and joint pain. Negative for falls.  Skin:  Negative for itching and rash.  Neurological:  Positive for tremors and weakness. Negative for dizziness, tingling and headaches.  Endo/Heme/Allergies:  Does not bruise/bleed easily.  Psychiatric/Behavioral:  Negative for depression. The patient is nervous/anxious.     Past Medical History:  Diagnosis Date   Anxiety    Basal cell carcinoma    Depression    Hypertension    Neuromuscular disorder (HCC)    Osteoarthritis    Osteopenia    Parkinson disease (HCC)    Pneumonia    Tremors of nervous system    legs   uterine  ca 10/08/2021   also endometrial cancer   Past Surgical History:  Procedure Laterality Date   CARPAL TUNNEL RELEASE Bilateral 1996/1997   CYSTOSCOPY N/A 10/08/2021   Procedure: CYSTOSCOPY;  Surgeon: Viktoria Comer SAUNDERS, MD;  Location: [facility];  Service: Gynecology;  Laterality: N/A;   ROBOTIC ASSISTED TOTAL HYSTERECTOMY WITH BILATERAL SALPINGO OOPHERECTOMY N/A 10/08/2021   Procedure: XI ROBOTIC ASSISTED TOTAL HYSTERECTOMY WITH  BILATERAL SALPINGO OOPHORECTOMY;SENTINEL LYMPH NODE INJECTION;  Surgeon: Viktoria Comer SAUNDERS, MD;  Location: [facility];  Service: Gynecology;  Laterality: N/A;   SQUAMOUS CELL CARCINOMA EXCISION Left 2017   Left jawline   TUBAL LIGATION  1980's   Social History:   reports that she has never smoked. She has never been exposed to tobacco smoke. She has never used smokeless tobacco. She reports that she does not currently use alcohol. She reports that she does not currently use drugs.  Family History  Problem Relation Age of Onset   Endometrial cancer Mother    Diabetes Mother    Alzheimer's disease Father    Coronary artery disease Father    Diabetes Sister    COPD Sister    Diabetes Sister    Heart disease Brother    Other Brother        MVA    Parkinson's disease Brother    Heart disease Brother    Alcohol abuse Brother    Heart disease Brother    Parkinson's disease Maternal Aunt    Breast cancer Maternal Aunt    Colon cancer Paternal Aunt    Parkinson's disease Paternal Aunt    Lung cancer Paternal Uncle    Healthy Son    Healthy Son    Prostate cancer Neg Hx    Pancreatic cancer Neg Hx    Ovarian cancer Neg Hx     Medications: Patient's Medications  New Prescriptions   No medications on file  Previous Medications   ACETAMINOPHEN  (TYLENOL ) 500 MG TABLET    Take 500 mg by mouth every 6 (six) hours as needed for moderate pain (pain score 4-6).   CAFFEINE 200 MG TABS TABLET    Take 200 mg by mouth daily.   CARBIDOPA -LEVODOPA  (SINEMET  IR) 25-100 MG TABLET    Take 1 tablet by mouth 5 (five) times daily.   ESCITALOPRAM (LEXAPRO) 20 MG TABLET    Take 20 mg by mouth daily.   IBUPROFEN (ADVIL) 200 MG TABLET    Take 200 mg by mouth every 6 (six) hours as needed for moderate pain (pain score 4-6).   MULTIPLE VITAMIN (MULTI-VITAMIN) TABLET    Take 1 tablet by mouth daily.  Modified Medications   No medications on file  Discontinued Medications   No medications on file     Physical Exam:  Vitals:   05/02/24 0944  BP: 130/84  Pulse: 87  Temp: (!) 97.5 F (36.4 C)  SpO2: 98%  Weight: 161 lb (73 kg)  Height: 5' 1 (1.549 m)   Body mass index is 30.42 kg/m. Wt Readings from Last 3 Encounters:  05/02/24 73 kg  02/09/24 71.4 kg  12/28/23 70.8 kg    Physical Exam Constitutional:      Appearance: Normal appearance.  HENT:     Head: Normocephalic.     Right Ear: Tympanic membrane normal.     Left Ear: Tympanic membrane normal.     Nose: Nose normal.  Eyes:     Pupils: Pupils are equal, round, and reactive to light.  Cardiovascular:  Rate and Rhythm: Normal rate and regular rhythm.     Pulses: Normal pulses.     Heart sounds: Normal heart sounds.  Pulmonary:     Effort: Pulmonary effort is normal.     Breath sounds: Normal breath sounds.  Abdominal:     General: Bowel sounds are normal.     Palpations: Abdomen is soft.  Musculoskeletal:     Cervical back: Normal range of motion and neck supple.     Comments: General weakness  Skin:    General: Skin is warm and dry.     Capillary Refill: Capillary refill takes 2 to 3 seconds.  Neurological:     Mental Status: She is alert.     Motor: Weakness present.  Psychiatric:        Mood and Affect: Mood normal.    Labs reviewed: Basic Metabolic Panel: Recent Labs    06/19/23 1430 07/13/23 1040 12/28/23 1403  NA 140 140 143  K 4.4 3.9 4.0  CL 105 104 107  CO2 28 29 29   GLUCOSE 137 115* 108  BUN 7 14 18   CREATININE 1.03* 0.77 0.90  CALCIUM 10.4 9.6 9.9   Liver Function Tests: Recent Labs    05/26/23 1242 12/28/23 1403  AST 9* 12  ALT <5 4*  ALKPHOS 79  --   BILITOT 1.1 0.6  PROT 6.4* 6.4  ALBUMIN 4.0  --    Recent Labs    05/26/23 1242  LIPASE 31   No results for input(s): AMMONIA in the last 8760 hours. CBC: Recent Labs    05/26/23 1242 05/27/23 2329 12/28/23 1403  WBC 4.9 4.6 6.2  NEUTROABS 3.6 2.9 4,390  HGB 13.6 13.6 13.8  HCT 39.9 38.8 40.8  MCV  88.7 86.2 93.4  PLT 205 228 240   Lipid Panel: No results for input(s): CHOL, HDL, LDLCALC, TRIG, CHOLHDL, LDLDIRECT in the last 8760 hours. TSH: No results for input(s): TSH in the last 8760 hours. A1C: Lab Results  Component Value Date   HGBA1C 5.3 12/28/2023     Assessment/Plan  1. Parkinson's disease, unspecified whether dyskinesia present, unspecified whether manifestations fluctuate (HCC) (Primary) - Stable, managed with Sinemet  - Continues to experience tremors, orthostatic hypotension, dyskinesia, and stiffness. - Continue out patient PT and at home exercises  - Followed by Neurology in [facility] but has appt with neurology in [place] to hopefully transfer care as she is not able to tolerate the drive anymore.   2. Orthostatic hypotension - Stable, Continue to monitor BP daily  - Fall risk discussed, caution with activity and use of support person advised   3. Anxiety - Stable,  managed with Lexapro  - Some anxiety and occasional crying spells, no specific cause identified  - Continue social activities as tolerated    4. Chronic low back pain with sciatica, sciatica laterality unspecified, unspecified back pain laterality - Primarily lumbar muscle spasms   - Continue to use heat as tolerated   - Continue outpatient PT and at home exercises  - Advised not to use Xanax  for pain - Discussed Robaxin  risks vs benefits, due to neuromuscular complications from parkison's and high risk fall risks, patient decided not to start at this time.   5.  Overactive bladder -  No current incontinent symptoms -  Purwick use discussed for future use. Use of husband insurance discussed due to cost. Patient states she will look into it.   History of endometrial cancer.  - Followed by GYN oncologist,  upcoming appointment 10/3   Follow up in 4 months.  Chiquasia Morris, FNP-S I personally was present during the history, physical exam and medical decision-making  activities of this service and have verified that the service and findings are accurately documented in the student's note Seydina Holliman K. Caro BODILY [facility] [phone]

## 2024-05-03 ENCOUNTER — Ambulatory Visit: Payer: Self-pay | Admitting: Occupational Therapy

## 2024-05-03 DIAGNOSIS — M6281 Muscle weakness (generalized): Secondary | ICD-10-CM

## 2024-05-03 DIAGNOSIS — R2681 Unsteadiness on feet: Secondary | ICD-10-CM

## 2024-05-03 DIAGNOSIS — R29818 Other symptoms and signs involving the nervous system: Secondary | ICD-10-CM
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
## 2024-05-04 ENCOUNTER — Ambulatory Visit (HOSPITAL_BASED_OUTPATIENT_CLINIC_OR_DEPARTMENT_OTHER): Admitting: Physical Therapy

## 2024-05-04 DIAGNOSIS — M6281 Muscle weakness (generalized): Secondary | ICD-10-CM | POA: Diagnosis not present

## 2024-05-04 DIAGNOSIS — R269 Unspecified abnormalities of gait and mobility: Secondary | ICD-10-CM

## 2024-05-04 DIAGNOSIS — M5459 Other low back pain: Secondary | ICD-10-CM

## 2024-05-04 DIAGNOSIS — R29898 Other symptoms and signs involving the musculoskeletal system: Secondary | ICD-10-CM

## 2024-05-04 NOTE — Therapy (Signed)
 OUTPATIENT PHYSICAL THERAPY THORACOLUMBAR TREATMENT   Patient Name: Ashley Neal MRN: [id number] DOB:Sep 02, 1945, 78 y.o., female Today's Date: 05/05/2024  END OF SESSION:  PT End of Session - 05/04/24 1452     Visit Number 13   5 for lumbar   Number of Visits 17    Date for Recertification  05/10/24    Authorization Type [facility]    PT Start Time 1411    PT Stop Time 1454    PT Time Calculation (min) 43 min    Activity Tolerance Patient tolerated treatment well    Behavior During Therapy [facility] for tasks assessed/performed            Past Medical History:  Diagnosis Date   Anxiety    Basal cell carcinoma    Depression    Hypertension    Neuromuscular disorder (HCC)    Osteoarthritis    Osteopenia    Parkinson disease (HCC)    Pneumonia    Tremors of nervous system    legs   uterine ca 10/08/2021   also endometrial cancer   Past Surgical History:  Procedure Laterality Date   CARPAL TUNNEL RELEASE Bilateral 1996/1997   CYSTOSCOPY N/A 10/08/2021   Procedure: CYSTOSCOPY;  Surgeon: Viktoria Comer SAUNDERS, MD;  Location: [facility];  Service: Gynecology;  Laterality: N/A;   ROBOTIC ASSISTED TOTAL HYSTERECTOMY WITH BILATERAL SALPINGO OOPHERECTOMY N/A 10/08/2021   Procedure: XI ROBOTIC ASSISTED TOTAL HYSTERECTOMY WITH BILATERAL SALPINGO OOPHORECTOMY;SENTINEL LYMPH NODE INJECTION;  Surgeon: Viktoria Comer SAUNDERS, MD;  Location: [facility];  Service: Gynecology;  Laterality: N/A;   SQUAMOUS CELL CARCINOMA EXCISION Left 2017   Left jawline   TUBAL LIGATION  1980's   Patient Active Problem List   Diagnosis Date Noted   Lumbar spondylosis 12/23/2023   Coccydynia 12/23/2023   Endometrial cancer (HCC) 10/16/2021   Complex atypical endometrial hyperplasia 09/27/2021   Primary osteoarthritis of both hands 05/14/2020   Primary osteoarthritis of both feet 05/14/2020   Age-related osteoporosis without current pathological fracture 05/14/2020   Prediabetes 05/14/2020   Essential  hypertension 05/14/2020   History of hyperlipidemia 05/14/2020   History of squamous cell carcinoma 05/14/2020   Parkinsonism (HCC) 05/14/2020     REFERRING PROVIDER:  [name], MD   REFERRING DIAG: [phone] (ICD-10-CM) - Lumbar spondylosis M53.3 (ICD-10-CM) - Coccydynia   Rationale for Evaluation and Treatment: Rehabilitation  THERAPY DIAG:  Other low back pain  Muscle weakness (generalized)  Other symptoms and signs involving the musculoskeletal system  Abnormality of gait and mobility  ONSET DATE: 2022 / PT order 12/23/2023  SUBJECTIVE:  SUBJECTIVE STATEMENT:  Pt had OT yesterday and has 1 more appt with OT.   Pt has increased lumbar pain with walking.  Pt states she had pain ambulating to her car after prior Rx due to the distance.  Pt denies any adverse effects after prior Rx.  Pt is compliant with big and loud exercises and PT exercises.  Pt reports the lumbar flexion stretch with the ball helps a little bit.     PERTINENT HISTORY:  Chronic back pain, slight anterolisthesis L3-4, L4-L5 Parkinson's disease OA, Anxiety, HTN, uterine cancer, endometrial cancer, hernia   PAIN:  NPRS:     5/10 pain   Location:  bilat sides of lumbar   Pt reports the coccyx pain is constant, typically a 3-4/10.  PRECAUTIONS: Other: Parkinson's disease, slight anterolisthesis    WEIGHT BEARING RESTRICTIONS: No  FALLS:  Has patient fallen in last 6 months? Yes. Number of falls 2, at post office when her leg gave way.  Fell at home when trying to get off of her sofa   LIVING ENVIRONMENT: Lives with: son lives with her Lives in: one level home Stairs: 1 step to enter home with rail    PLOF: Independent  PATIENT GOALS: to be able to walk again without pain   OBJECTIVE:  Note:  Objective measures were completed at Evaluation unless otherwise noted.  DIAGNOSTIC FINDINGS:  MRI in January of 2023 of her lumbar spine and pelvis which revealed the following.    L3-4: Mild disc bulging is asymmetric to the left. Mild facet hypertrophy is noted bilaterally. The central canal is patent. Mild left foraminal narrowing is present.   L4-5: A mild broad-based disc protrusion extends into the foramina bilaterally. Mild bilateral facet hypertrophy is noted. Mild foraminal narrowing is present bilaterally.   L5-S1: Minimal disc bulging is present. Mild left foraminal narrowing is noted.     1. No acute osseous injury of the pelvis. 2. Mild osteoarthritis of bilateral SI joints. 3. Partial-thickness tear of the gluteus medius tendon bilaterally. 4. Small partial-thickness tear of the left hamstring origin. 5. Broad-based disc bulge at L4-5 with bilateral mild facet arthropathy. Moderate bilateral facet arthropathy at L5-S1.  MRI in January in 2023:  FINDINGS: Segmentation: 5 non rib-bearing lumbar type vertebral bodies are present. The lowest fully formed vertebral body is L5.   Alignment:  Slight anterolisthesis is noted at L3-4 and L4-5.   Vertebrae: Scattered fatty infiltration of the marrow space is consistent with age. No focal lesions are present. Marrow signal and vertebral body heights are within normal limits.   Conus medullaris and cauda equina: Conus extends to the L1 level. Conus and cauda equina appear normal.   Paraspinal and other soft tissues: Limited imaging the abdomen is unremarkable. There is no significant adenopathy. No solid organ lesions are present.   Disc levels:   L1-2: Within normal limits.   L2-3: Within normal limits   L3-4: Mild disc bulging is asymmetric to the left. Mild facet hypertrophy is noted bilaterally. The central canal is patent. Mild left foraminal narrowing is present.   L4-5: A mild broad-based disc protrusion  extends into the foramina bilaterally. Mild bilateral facet hypertrophy is noted. Mild foraminal narrowing is present bilaterally.   L5-S1: Minimal disc bulging is present. Mild left foraminal narrowing is noted.   IMPRESSION: 1. Mild left foraminal narrowing at L3-4 and L5-S1. 2. Mild foraminal narrowing bilaterally at L4-5. 3. Slight anterolisthesis at L3-4 and L4-5.   PATIENT SURVEYS:  Modified  Oswestry:  MODIFIED OSWESTRY DISABILITY SCALE  Date: 03/16/2024 Score  Pain intensity 5 =  Pain medication has no effect on my pain.  2. Personal care (washing, dressing, etc.) 1 =  I can take care of myself normally, but it increases my pain.  3. Lifting 3 = Pain prevents me from lifting heavy weights, but I can manage (5) I have hardly any social life because of my pain. light to medium weights if they are conveniently positioned  4. Walking 3 =  Pain prevents me from walking more than  mile.  5. Sitting 3 =  Pain prevents me from sitting more than  hour.  6. Standing 4 =  Pain prevents me from standing more than 10 minutes.  7. Sleeping 0 = Pain does not prevent me from sleeping well.  8. Social Life 3 =  Pain prevents me from going out very often.  9. Traveling 4 = My pain restricts my travel to short necessary journeys under 1/2 hour.  10. Employment/ Homemaking 3 = Pain prevents me from doing anything but light duties.  Total 29/50 = 58%   Interpretation of scores: Score Category Description  0-20% Minimal Disability The patient can cope with most living activities. Usually no treatment is indicated apart from advice on lifting, sitting and exercise  21-40% Moderate Disability The patient experiences more pain and difficulty with sitting, lifting and standing. Travel and social life are more difficult and they may be disabled from work. Personal care, sexual activity and sleeping are not grossly affected, and the patient can usually be managed by conservative means  41-60% Severe  Disability Pain remains the main problem in this group, but activities of daily living are affected. These patients require a detailed investigation  61-80% Crippled Back pain impinges on all aspects of the patient's life. Positive intervention is required  81-100% Bed-bound  These patients are either bed-bound or exaggerating their symptoms  Bluford FORBES Zoe DELENA Karon DELENA, et al. Surgery versus conservative management of stable thoracolumbar fracture: the PRESTO feasibility RCT. [place]): [employer]; 2021 Nov. [facility] Technology Assessment, No. 25.62.) Appendix 3, Oswestry Disability Index category descriptors. Available from: FindJewel[employer].cz  Minimally Clinically Important Difference (MCID) = 12.8%  COGNITION: Overall cognitive status: Within functional limits for tasks assessed      PALPATION: Tremor in R hand  Pt has soft tissue tightness in bilat lumbar paraspinals.  Minimal tenderness on R sided lumbar which is less on L.   LUMBAR ROM:   AROM eval  Flexion WFL  Extension   Right lateral flexion 50%  Left lateral flexion 50%  Right rotation 40%  Left rotation 40%   (Blank rows = not tested)    LOWER EXTREMITY MMT:    MMT Right eval Left eval  Hip flexion 24.3 15.3  Hip extension    Hip abduction 23.2 16.1  Hip adduction    Hip internal rotation    Hip external rotation    Knee flexion    Knee extension 26.3 25.9  Ankle dorsiflexion    Ankle plantarflexion    Ankle inversion    Ankle eversion     (Blank rows = not tested)    GAIT: Assistive device utilized: None Comments:  fwd lean with gait, decreased arm swing bilat, decreased step length bilat  TREATMENT:  05/04/24: Supine PPT 2x10 Supine bridge 2x10 Supine lumbar rotation x10 Supine marching with TrA 2x10 Seated lumbar flexion  with  p-ball x 10 reps Standing heel raises 2x10 Sidestepping with bilat UE support x 2 laps Standing marching with UE support  Manual Therapy:  STM to bilat lumbar paraspinals in L S/L'ing with pilllow b/w knees       PATIENT EDUCATION:  Education details: HEP, POC, bed mobility, relevant anatomy, exercise form, dx, and rationale of interventions.  Person educated: Patient Education method: Explanation, demonstration, verbal and tactile cues Education comprehension: verbalized understanding and needs further education, verbal and tactile cues required, returned demonstration  HOME EXERCISE PROGRAM: Access Code: [email or web address] URL: [email or web address] Date: 04/13/2024 Prepared by: Mose Minerva  Exercises - Supine Bridge  - 1 x daily - 5-7 x weekly - 2 sets - 10 reps - Hooklying Lumbar Rotation  - 1-2 x daily - 7 x weekly - 2 sets - 10 reps - Supine Posterior Pelvic Tilt  - 1 x daily - 7 x weekly - 2 sets - 10 reps  ASSESSMENT:  CLINICAL IMPRESSION: Pt continues to have constant pain in coccyx.  PT assisted pt with bed mobility though she had improved bed mobility today.  Pt did not have freezing episodes today.  PT provided instruction and cuing for correct form and positioning with exercises.  Pt had good tolerance with exercises and had no c/o's during exercises.  PT performed STM to bilat lumbar paraspinals to improve pain and soft tissue mobility and tightness.  Pt had no c/o's after treatment.  She should benefit from continued skilled PT to address impairments and goals and improve overall function.   OBJECTIVE IMPAIRMENTS: Abnormal gait, decreased activity tolerance, decreased balance, decreased endurance, decreased mobility, difficulty walking, decreased ROM, decreased strength, hypomobility, increased fascial restrictions, and pain.   ACTIVITY LIMITATIONS: standing and locomotion level, sitting  PARTICIPATION LIMITATIONS: cleaning, shopping, and community  activity  PERSONAL FACTORS: Time since onset of injury/illness/exacerbation and 1-2 comorbidities: Parkinson's, OA are also affecting patient's functional outcome.   REHAB POTENTIAL: Good  CLINICAL DECISION MAKING: Evolving/moderate complexity  EVALUATION COMPLEXITY: Moderate   GOALS:   SHORT TERM GOALS: Target date: 04/05/2024   Pt will be independent and compliant with HEP for improved pain, strength, ROM, and function.  Baseline: Goal status: INITIAL  2.  Pt will demo at least a 25% improvement in bilat Sb'and and rotation AROM for improved stiffness and daily mobility. Baseline:  Goal status: INITIAL  3.  Pt will report at least a 25% improvement in pain and sx's overall.   Baseline:  Goal status: INITIAL  4.  Pt will progress with core exercises without adverse effects for improved core and postural strength and improved performance of daily activities and daily mobility.  Baseline:  Goal status: INITIAL    LONG TERM GOALS: Target date: 05/10/2024  Pt will demo at least a 7 lb improvement in L hip flex and abd strength and a 5 lb improvement in bilat knee extension strength for improved performance of and tolerance with functional mobility.  Baseline:  Goal status: INITIAL  2.  Pt will report at least a 70% improvement in pain with ambulation.   Baseline:  Goal status: INITIAL  3.  Pt's Oswestry will be no > than 24% for improved self perceived disability and function. Baseline:  Goal status: INITIAL  4.  Pt will deny L LE weakness with her daily ambulation.  Baseline:  Goal status: INITIAL  PLAN:  PT FREQUENCY: 1x/week  PT DURATION: 6 weeks-8 weeks  PLANNED INTERVENTIONS: [id number]- PT Re-evaluation, 97750- Physical Performance Testing, 97110-Therapeutic exercises, 97530- Therapeutic activity, [id number]- Neuromuscular re-education, 97535- Self Care, [id number]- Manual therapy, [phone]- Gait training, [phone]- Aquatic Therapy, [phone]- Electrical stimulation  (unattended), [phone]- Electrical stimulation (manual), [id number]- Ultrasound, Patient/Family education, Balance training, Stair training, Taping, Joint mobilization, Spinal mobilization, DME instructions, Cryotherapy, and Moist heat.  PLAN FOR NEXT SESSION: STM to lumbar paraspinals.  Core strength.  Review and perform HEP.  Leigh Minerva III PT, DPT 05/06/24 12:05 AM

## 2024-05-05 ENCOUNTER — Encounter (HOSPITAL_BASED_OUTPATIENT_CLINIC_OR_DEPARTMENT_OTHER): Payer: Self-pay | Admitting: Physical Therapy

## 2024-05-06 ENCOUNTER — Encounter: Payer: PPO | Admitting: Nurse Practitioner

## 2024-05-10 ENCOUNTER — Ambulatory Visit: Payer: Self-pay | Admitting: Occupational Therapy

## 2024-05-10 DIAGNOSIS — M6281 Muscle weakness (generalized): Secondary | ICD-10-CM

## 2024-05-10 DIAGNOSIS — R29818 Other symptoms and signs involving the nervous system: Secondary | ICD-10-CM

## 2024-05-10 DIAGNOSIS — R2681 Unsteadiness on feet: Secondary | ICD-10-CM
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
## 2024-05-10 NOTE — Patient Instructions (Signed)
 Bag Exercises:  Small trash bag or produce bag works best.  For all exercises, sit with big posture (sit up tall with head up) and use big movements. Perform the following exercises 1 times per day.  Hold bag in one hand. Stretch both arms/hands out to the side as big as you can. Then, pass bag from one hand to the other IN FRONT of you. Stretch arms back out big after each pass. Repeat 10 times. Hold bag in one hand. Stretch both arms/hands out to the side as big as you can. Then, pass bag from one hand to the other BEHIND you. Stretch arms back out big after each pass. Repeat 10 times. Hold bag in both hands in front of you with hands/arms shoulder length apart. Move bag behind your head. Repeat 10 times. Hold bag in both hands in front of you with hands/arms shoulder length apart. Lift leg and move bag completely under each foot and back. Repeat 10 times on each side. Hold bag in right hand. Move right hand to reach behind shoulder. Then, reach behind back with left hand to pass bag from right hand to left hand. Switch sides. Repeat 10 times on each side. Hold bag in one hand. Toss bag up and catch with the same/opposite hand. Repeat 10 times with each hand.

## 2024-05-11 ENCOUNTER — Encounter (HOSPITAL_BASED_OUTPATIENT_CLINIC_OR_DEPARTMENT_OTHER): Payer: Self-pay | Admitting: Physical Therapy

## 2024-05-11 ENCOUNTER — Ambulatory Visit: Admitting: Neurology

## 2024-05-11 ENCOUNTER — Ambulatory Visit (HOSPITAL_BASED_OUTPATIENT_CLINIC_OR_DEPARTMENT_OTHER): Attending: Nurse Practitioner | Admitting: Physical Therapy

## 2024-05-11 ENCOUNTER — Encounter: Payer: Self-pay | Admitting: Gynecologic Oncology

## 2024-05-11 VITALS — BP 153/88 | HR 91 | Ht 61.0 in | Wt 162.0 lb

## 2024-05-11 DIAGNOSIS — G20A2 Parkinson's disease without dyskinesia, with fluctuations: Secondary | ICD-10-CM | POA: Diagnosis not present

## 2024-05-11 DIAGNOSIS — R269 Unspecified abnormalities of gait and mobility: Secondary | ICD-10-CM | POA: Insufficient documentation

## 2024-05-11 DIAGNOSIS — M5459 Other low back pain: Secondary | ICD-10-CM | POA: Insufficient documentation

## 2024-05-11 DIAGNOSIS — G8929 Other chronic pain: Secondary | ICD-10-CM | POA: Diagnosis not present

## 2024-05-11 DIAGNOSIS — M25561 Pain in right knee: Secondary | ICD-10-CM | POA: Diagnosis not present

## 2024-05-11 DIAGNOSIS — M533 Sacrococcygeal disorders, not elsewhere classified: Secondary | ICD-10-CM | POA: Diagnosis not present

## 2024-05-11 DIAGNOSIS — M6281 Muscle weakness (generalized): Secondary | ICD-10-CM | POA: Diagnosis not present

## 2024-05-11 DIAGNOSIS — R42 Dizziness and giddiness: Secondary | ICD-10-CM

## 2024-05-11 DIAGNOSIS — R29898 Other symptoms and signs involving the musculoskeletal system: Secondary | ICD-10-CM | POA: Diagnosis not present

## 2024-05-11 NOTE — Progress Notes (Signed)
 Subjective:    Patient ID: Ashley Neal is a 78 y.o. female.  HPI    Interim history:   Ashley Neal is a 78 year old right-handed woman with an underlying medical history of hypertension, hyperlipidemia, history of colitis, osteoarthritis, osteoporosis, reflux disease, anxiety, endometrial cancer with status post robotic assisted total hysterectomy and bilateral salpingo-oophorectomy on 10/08/2021 as well as robotic assisted lymph node dissection and para-aortic lymphadenectomy on 11/06/2021, and adjuvant radiation therapy, who presents for reevaluation of her Parkinson's disease of several years duration and to reestablish care.  The patient is unaccompanied today.    She requested to establish care elsewhere after her last visit at [facility] when she saw Ashley Russell, NP in November 2023.  Today, 05/11/2024: She reports overall doing fairly.  She has right knee pain and swelling but has not seen her PCP for this yet.  She has an appointment tomorrow with PCP and also a follow-up with oncology this week.  She has not fallen recently.  She has a cane and a walker and a rollator but did not bring any walking aids today.  She drove herself.  She reports being cautious with her driving and when she feels dizzy she does not drive.  She has had interim intermittent dizziness.  She reports that she did not do well when she took levodopa  2 pills 5 times a day.  She has tried compression stockings for low blood pressure but cannot get them on, even the one with the zipper.  She does have some anxiety at times but denies any depression, tolerates Lexapro generic at 20 mg daily.  She takes Sinemet  1 pill 5 times a day and it was 1-1/2 pills 5 times a day but feels that she gets by with 1 pill 5 times a day all right for now.  She takes it at 7 AM, 11 AM, 3 PM, 7 PM and 11 PM daily.  She goes to bed around 9 but watches TV until about 11:15 PM.  Her son lives with her.  She has a harder time with dressing and  getting out of bed, she has a grab bar installed at her bedside.  She reports that her husband has had chronic medical issues but he lives in [place]  on a farm and she lives here.  She has been in PT, OT and ST.  She has seen Dr. Babs for her tailbone pain.  She has in the interim been followed by [facility] neurology.  She has also seen neurology at [facility] and has also more recently seen Dr. Rosalia at [facility] neurology.  I reviewed recent neurology records in her electronic charts from the past several months.   She saw Harlene Louis, NP with [facility] neurology in a video visit on 03/21/2024, at which time orthostatic hypotension was discussed.  She saw Dr. Rosalia on 11/24/2023, at which time she was on levodopa  1 pill 5 times a day.  She reported residual tremor.  DBS was discussed but she was not interested in surgical treatment.  She did not have much in the way of motor fluctuation.  She saw Dr. Norleen Martens with [facility] neurology in [place] on 09/15/2023, at which time she was advised to continue with her levodopa  and an increase was considered but deferred because of orthostatic hypotension.  She had back pain and was followed by pain management..  She was on levodopa  1 pill 5 times a day.  She saw Dr. Norleen Martens with [facility] neurology in  [place] on 02/20/2023, at which time she was advised to increase levodopa  to 2 pills 5 times a day and increase Lexapro to 10 mg daily.  She reported issues with dysphagia and ongoing issues with back pain. Previously:   06/11/2022 Ashley Russell, NP): <<Ashley Neal is a 78 y.o. female who has been followed in this office for Parkinson's disease. Returns today for follow-up. Reports in Sinemet  25-100 taking 5 times a day and alternating between 1 and 1.5 tablets daily.  Reports dizziness-- has had it daily for the last 2 weeks. Briefly worse with position changes.  Reports that she drinks about 5 followers a day.  Recently started drinking propel as advised by  her PCP.  No recent falls. Legs feel weak- occurring for a few months. Notices tremor in the right hand. No trouble chewing or swallowing food. Sleeping well.  Reports that she wants to get started back with the senior center.>>  03/03/2022: I last saw her on 12/02/2021, at which time she reported increase in her tremor, her stress level had understandably increased.  She was taking levodopa  5 hourly.  I suggested we increase the dose slightly to 1-1/2 pills alternating with 1 pill (4 AM, 7 AM, 11 AM, 3 PM, 7 PM).    She reports having had difficulty with constipation, she has difficulty keeping up with the schedule especially the early morning dosing.  I reminded her that we initiated the early morning dose of levodopa  because she was awake that early.  She reports that she does not wake up that early now.  She is willing to shift the schedule a little bit to take 1-1/2 pills for all 5 doses and to take the first dose at 7, and keep a 4 hourly schedule with the last dose around 11 now.  She has had increase in stress.  Her husband has stayed in [place] .  She reports having had 1 fall in the last week, she fell in a parking garage, her friend was with her, she landed on her buttocks, did not have any injuries but has had back pain.  The back pain actually seem to be just slightly better after the fall per her report, she also had a recent low back injection, has not felt a kick in yet.  She has been taking MiraLAX  and Dulcolax for her constipation.  She tries to hydrate well.   She has had more anxiety symptoms, she takes Xanax  sparingly per PCP.  She has been to urgent care for back pain.  She went to [facility] ED on 01/31/2022 for back pain.  I reviewed the emergency room records.  She received Dilaudid , Zofran , Rocephin , and Decadron .  She was found to have a UTI.  She had a MRI of the pelvis with and without contrast on 01/25/2022 and I reviewed the results:IMPRESSION: 1. No acute findings or clear  explanation for the patient's symptoms. 2. Although incompletely visualized, grossly stable partial tear of the left common hamstring tendon. Mild gluteus tendinosis bilaterally. 3. No evidence of sacral fracture or metastatic disease. Mild sacroiliac degenerative changes bilaterally.     I saw her on 04/08/2021, at which time she reported dizziness, lack of energy and blood pressure fluctuation.  She was taken off of hydrochlorothiazide because of supine hypertension and her lisinopril  was increased.  She reported trying THC Gummies recently which reduced her tremor.  She was advised to use compression socks and stay well-hydrated.  She was advised to continue to  stay off of pramipexole  as it was for as needed use for restless leg syndrome and she had not used it.  She was advised to continue with Sinemet  1 pill 4 times a day.     She saw Ashley Russell, NP in the interim for sooner than scheduled visit on 06/20/2021, at which time she reported worsening symptoms of her Parkinson's disease.  She needed more assistance she had issues with back pain and this decreased her mobility further.  She was using Mirapex  0.125 mg at bedtime at the time.   I saw her on 10/08/2020, at which time she reported blood pressure fluctuations and dizziness associated with low BP values. She had irregularity on heart auscultation and was advised to FU with PCP and discuss cardiology referral. She was advised to keep her Sinemet  at 1 pill qid.      I saw her on 04/02/2020, at which time, she reported worsening tremors and posture was worse.  She had stopped taking the Mirapex  because she did not have much in the way of restless leg symptoms anymore.  She was planning a trip to [place]  in October 2021.  She was referred to physical therapy and we increased her Sinemet  to 1 pill 4 times a day.  She emailed back a week or so later reporting that the Sinemet  was helpful at the increased dose.             I saw her on  10/04/2019, at which time she reported Some postural instability.  She felt that she had more tremor.  She had worsening urinary incontinence but constipation was better.  She was encouraged to see urology.  She endorsed some restlessness particularly affecting her feet at night.  She was advised to start low-dose Mirapex  particularly for restless leg symptoms.     I saw her on 03/31/2019, at which time, she reported overall doing well, sometimes she had some more gait instability. She was active, but not able to pursue any of her group exercises including cardio drumming.  She had issues with constipation. She had bilateral foot pain, and was supposed to see a podiatrist soon. She was considering CBD oil for arthritis and pain.       I saw her on 09/29/2018, at which time she reported that she tried tai chi but did not enjoy it.  She looked into boxing classes but decided not to pursue it, she did start cardio drumming which she was enjoying as well as chair yoga.  She had occasional symptoms of restless legs.  She felt stable motor wise, she had an occasional flareup in the right hand tremor, rare constipation issues.  She was advised to continue with Sinemet  1 pill 3 times daily.    I saw her on 03/29/2018 at which time she reported doing okay. She was taking Sinemet  3 times a day but sometimes would forget the midday dose. Is trying to exercise on a regular basis, would go to the gym about 3 days of the week. 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 I suggested she continue with Sinemet  3 times a day.   I saw her on 11/11/2017, at which time she reported diffuse aches and pains. She had seen orthopedics. She had blood work with PCP. She had seen Dr. Evonnie twice. She was trying to exercise. She was advised to follow-up routinely and advised that we would continue to observe her and follow clinically.   She called in the  interim in late April after she had seen rheumatology. She was  advised to start Sinemet  with gradual titration at the time.   [name] on 04/16/2017 at the request of her primary care physician, at which time she reported a several month history of intermittent right hand tremors and also additional symptoms including difficulty turning over in bed or certain movements when dancing. I suggested observation and a 6 month follow-up. I did suggest we could proceed with a brain MRI but she declined as she was highly claustrophobic and simple anti-anxiety medication would not help. She had interim appointments with Dr. Evonnie on 06/09/17 and again on 10/20/17. I reviewed the notes. Potential symptomatic medication was discussed, a PET scan was also discussed.      04/16/2017: (She) reports a several month history of a intermittent right hand tremor. She also reports difficulty with certain movements such as turning over in bed or certain movements when dancing. I reviewed your office note from 04/09/2017, which you kindly included. She reports recent weight loss without particularly trying to lose weight. Blood work through your office from 04/02/2017 was reviewed revealing normal CMP with a glucose level of 105, total cholesterol of 107, triglycerides 171, LDL 126. Recent A1c on 04/09/2017 was borderline at 5.8. CBC with differential was normal, B12 434. N TSH and N CK level on 04/09/17.  No FHx of PD or ET, father had dementia and lived to be 30, mom died at 24 from metastatic endom cancer. One brother with asthma, 2 sister, younger brother with EtOH abuse, youngest brother died at 5 yo in a car accident.  She has R shoulder problems, has had back pain in the mid back. Has had dexterity issue. She saw ortho at [facility], and has been to [facility] for PT.  She is doing exercises. 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  Has retired recently, 2 grown sons, one of them local, the other in [place] .  She is a non-smoker.  Her Past Medical History Is  Significant For: Past Medical History:  Diagnosis Date   Anxiety    Basal cell carcinoma    Depression    Hypertension    Neuromuscular disorder (HCC)    Osteoarthritis    Osteopenia    Parkinson disease (HCC)    Pneumonia    Tremors of nervous system    legs   uterine ca 10/08/2021   also endometrial cancer    Her Past Surgical History Is Significant For: Past Surgical History:  Procedure Laterality Date   CARPAL TUNNEL RELEASE Bilateral 1996/1997   CYSTOSCOPY N/A 10/08/2021   Procedure: CYSTOSCOPY;  Surgeon: Viktoria Comer SAUNDERS, MD;  Location: [facility];  Service: Gynecology;  Laterality: N/A;   ROBOTIC ASSISTED TOTAL HYSTERECTOMY WITH BILATERAL SALPINGO OOPHERECTOMY N/A 10/08/2021   Procedure: XI ROBOTIC ASSISTED TOTAL HYSTERECTOMY WITH BILATERAL SALPINGO OOPHORECTOMY;SENTINEL LYMPH NODE INJECTION;  Surgeon: Viktoria Comer SAUNDERS, MD;  Location: [facility];  Service: Gynecology;  Laterality: N/A;   SQUAMOUS CELL CARCINOMA EXCISION Left 2017   Left jawline   TUBAL LIGATION  1980's    Her Family History Is Significant For: Family History  Problem Relation Age of Onset   Endometrial cancer Mother    Diabetes Mother    Alzheimer's disease Father    Coronary artery disease Father    Diabetes Sister    COPD Sister    Diabetes Sister    Heart disease Brother    Other Brother  MVA    Parkinson's disease Brother    Heart disease Brother    Alcohol abuse Brother    Heart disease Brother    Parkinson's disease Maternal Aunt    Breast cancer Maternal Aunt    Colon cancer Paternal Aunt    Parkinson's disease Paternal Aunt    Lung cancer Paternal Uncle    Healthy Son    Healthy Son    Prostate cancer Neg Hx    Pancreatic cancer Neg Hx    Ovarian cancer Neg Hx     Her Social History Is Significant For: Social History   Socioeconomic History   Marital status: Married    Spouse name: Ashley Neal   Number of children: 2   Years of education: 16   Highest education  level: Bachelor's degree (e.g., BA, AB, BS)  Occupational History   Occupation: retired    Comment: Advertising account planner  Tobacco Use   Smoking status: Never    Passive exposure: Never   Smokeless tobacco: Never  Vaping Use   Vaping status: Never Used  Substance and Sexual Activity   Alcohol use: Not Currently    Comment: rarely   Drug use: Not Currently   Sexual activity: Not Currently    Partners: Male  Other Topics Concern   Not on file  Social History Narrative   Not on file   Social Drivers of Health   Financial Resource Strain: Low Risk  (05/01/2024)   Overall Financial Resource Strain (CARDIA)    Difficulty of Paying Living Expenses: Not very hard  Food Insecurity: No Food Insecurity (05/01/2024)   Hunger Vital Sign    Worried About Running Out of Food in the Last Year: Never true    Ran Out of Food in the Last Year: Never true  Transportation Needs: No Transportation Needs (05/01/2024)   PRAPARE - Administrator, Civil Service (Medical): No    Lack of Transportation (Non-Medical): No  Physical Activity: Sufficiently Active (05/01/2024)   Exercise Vital Sign    Days of Exercise per Week: 6 days    Minutes of Exercise per Session: 40 min  Stress: No Stress Concern Present (05/01/2024)   Ashley Neal [employer] - Occupational Stress Questionnaire    Feeling of Stress: Only a little  Social Connections: Socially Integrated (05/01/2024)   Social Connection and Isolation Panel    Frequency of Communication with Friends and Family: More than three times a week    Frequency of Social Gatherings with Friends and Family: Twice a week    Attends Religious Services: More than 4 times per year    Active Member of [employer] or Organizations: Yes    Attends Engineer, structural: More than 4 times per year    Marital Status: Married    Her Allergies Are:  Allergies  Allergen Reactions   Gabapentin Other (See Comments)    Ringing in ears    Hydrocodone -Acetaminophen      Dizzy, ringing in ears  Ineffective for pain  Dizzy, ringing in ears Ineffective for pain   Lyrica  [Pregabalin ]     Dizziness and ringing in ears    Codeine Other (See Comments)    Nausea   :   Her Current Medications Are:  Outpatient Encounter Medications as of 05/11/2024  Medication Sig   acetaminophen  (TYLENOL ) 500 MG tablet Take 500 mg by mouth every 6 (six) hours as needed for moderate pain (pain score 4-6).   carbidopa -levodopa  (SINEMET  IR) 25-100 MG  tablet Take 1 tablet by mouth 5 (five) times daily.   escitalopram (LEXAPRO) 20 MG tablet Take 20 mg by mouth daily.   ibuprofen (ADVIL) 200 MG tablet Take 200 mg by mouth every 6 (six) hours as needed for moderate pain (pain score 4-6).   Multiple Vitamin (MULTI-VITAMIN) tablet Take 1 tablet by mouth daily.   [DISCONTINUED] caffeine 200 MG TABS tablet Take 200 mg by mouth daily. (Patient not taking: Reported on 05/02/2024)   No facility-administered encounter medications on file as of 05/11/2024.  :  Review of Systems:  Out of a complete 14 point review of systems, all are reviewed and negative with the exception of these symptoms as listed below:   Review of Systems  Neurological:        Tremors for years.  Family hx PD.  Noted shuffling walk, R hand tremor noted.     Objective:  Neurological Exam  Physical Exam Physical Examination:   Vitals:   05/11/24 1025  BP: (!) 153/88  Pulse: 91    General Examination: The patient is a very pleasant 78 y.o. female in no acute distress. She appears frail and mildly deconditioned, well-groomed.   HEENT: Normocephalic, atraumatic, pupils are equal, round and reactive to light, corrective eyeglasses in place. Extraocular tracking is mildly impaired.  No nystagmus. Moderate facial masking noted, no dysarthria, no obvious sialorrhea.  Mild to moderate hypophonia.  Neck is moderately rigid.  No carotid bruits.  Airway examination reveals moderate mouth  dryness.  Tongue protrudes centrally and palate elevates symmetrically.      Chest, heart, abdomen and extremities:    Chest is clear to auscultation, heart sounds normal, no murmur.      There are prominent arthritic changes in both hands, mild tenderness right knee.  She also reports low back pain, pain in her tailbone.     Skin: Warm and dry. Mild varicose veins in the distal lower extremities and also some spider veins.   Musculoskeletal: Good range of motion, difficulty with hand movements secondary to arthritis.     Neurologically:  Mental status: The patient is awake, alert and oriented in all 4 spheres. Her immediate and remote memory, attention, language skills and fund of knowledge are appropriate. There is no evidence of aphasia, agnosia, apraxia or anomia. Speech is clear with normal prosody and enunciation. Thought process is linear.  She is referring to the notes on her cell phone for today's visit. Mood is stable, affect normal.Cranial nerves II - XII are as described above under HEENT exam. Left shoulder is slightly higher than right, stable. Motor exam: Normal bulk, and strength is noted for age. She has a moderately increased tone in the upper extremity on the right side, mildly increased tone on the left.  She has a mild to moderate resting tremor in the right upper extremity and mild intermittent resting tremor in the right lower extremity, intermittent mild tremor on the left side noted.   Overall, moderate bradykinesia.     (On 04/16/2017: On Archimedes spiral drawing there is no significant trembling noted. Her handwriting is not tremulous and legible, slightly micrographic.)     Fine motor skills and coordination: She moderate difficulty with hand movements and finger taps, fine motor skills overall moderately impaired on the right upper and lower extremity and better on the left.  Cerebellar testing: No dysmetria or intention tremor on finger to nose testing. There is no  truncal or gait ataxia.  Sensory exam: intact to light touch  in the upper and lower extremities.  Gait, station and balance: She stands with mild difficulty and pushes herself up.   Posture is mild to moderately stooped, with increase in lumbar kyphosis, she walks with decreased stride length and pace, mild shuffling.  She has decreased arm swing bilaterally.  She turns slowly.  No walking aid   Assessment and Plan:    In summary, SHELAH HEATLEY is a 78 year old right-handed woman with an underlying medical history of hypertension, hyperlipidemia, history of colitis, osteoarthritis, osteoporosis, reflux disease, anxiety, endometrial cancer with status post robotic assisted total hysterectomy and bilateral salpingo-oophorectomy on 10/08/2021 as well as robotic assisted lymph node dissection and para-aortic lymphadenectomy on 11/06/2021, and adjuvant radiation therapy, who presents for reevaluation of her Parkinson's disease of several years duration and to reestablish care.  She presents after 2+ years and has in the interim seen multiple other neurology providers in different systems.  Over time, she has had experienced progression of her symptoms including decreased mobility and worsening tremor.  She has had increase in stress.    She is currently on generic Sinemet , 1 pill 5 times a day.  She has had has had orthostatic hypotension, today her blood pressure is elevated.  In the past, we increased her levodopa  to 1-1/2 pills 5 times a day back in April 2023 and this was further increased to 2 pills 5 times a day when she was seen at [facility].  She had more side effects with the increased dose and is currently back on levodopa  1 pill 5 times a day and she can maintain on this.  She can maintain on Lexapro generic 20 mg daily.  We talked about the importance of maintaining a healthy lifestyle and fall prevention.  She is advised to use a walker when she comes to the doctors office just for safety and follow-up  with her PCP regarding her right knee pain.  She is advised to follow-up in this clinic to see the nurse practitioner in about 6 months routinely, sooner if needed.  I answered all her questions today and she was in agreement.   I spent 45 minutes in total face-to-face time and in reviewing records during pre-charting, more than 50% of which was spent in counseling and coordination of care, reviewing test results, reviewing medications and treatment regimen and/or in discussing or reviewing the diagnosis of PD, the prognosis and treatment options. Pertinent laboratory and imaging test results that were available during this visit with the patient were reviewed by me and considered in my medical decision making (see chart for details).

## 2024-05-11 NOTE — Therapy (Incomplete)
 OUTPATIENT PHYSICAL THERAPY THORACOLUMBAR TREATMENT   Patient Name: Ashley Neal MRN: [id number] DOB:1946-03-13, 78 y.o., female Today's Date: 05/12/2024  END OF SESSION:  PT End of Session - 05/11/24 1412     Visit Number 14   6 for lumbar   Number of Visits 17    Date for Recertification  06/01/24    Authorization Type [facility]    PT Start Time 1402    PT Stop Time 1453    PT Time Calculation (min) 51 min    Activity Tolerance Patient tolerated treatment well    Behavior During Therapy [facility] for tasks assessed/performed             Past Medical History:  Diagnosis Date   Anxiety    Basal cell carcinoma    Depression    Hypertension    Neuromuscular disorder (HCC)    Osteoarthritis    Osteopenia    Parkinson disease (HCC)    Pneumonia    Tremors of nervous system    legs   uterine ca 10/08/2021   also endometrial cancer   Past Surgical History:  Procedure Laterality Date   CARPAL TUNNEL RELEASE Bilateral 1996/1997   CYSTOSCOPY N/A 10/08/2021   Procedure: CYSTOSCOPY;  Surgeon: Viktoria Comer SAUNDERS, MD;  Location: [facility];  Service: Gynecology;  Laterality: N/A;   ROBOTIC ASSISTED TOTAL HYSTERECTOMY WITH BILATERAL SALPINGO OOPHERECTOMY N/A 10/08/2021   Procedure: XI ROBOTIC ASSISTED TOTAL HYSTERECTOMY WITH BILATERAL SALPINGO OOPHORECTOMY;SENTINEL LYMPH NODE INJECTION;  Surgeon: Viktoria Comer SAUNDERS, MD;  Location: [facility];  Service: Gynecology;  Laterality: N/A;   SQUAMOUS CELL CARCINOMA EXCISION Left 2017   Left jawline   TUBAL LIGATION  1980's   Patient Active Problem List   Diagnosis Date Noted   Lumbar spondylosis 12/23/2023   Coccydynia 12/23/2023   Endometrial cancer (HCC) 10/16/2021   Complex atypical endometrial hyperplasia 09/27/2021   Primary osteoarthritis of both hands 05/14/2020   Primary osteoarthritis of both feet 05/14/2020   Age-related osteoporosis without current pathological fracture 05/14/2020   Prediabetes 05/14/2020   Essential  hypertension 05/14/2020   History of hyperlipidemia 05/14/2020   History of squamous cell carcinoma 05/14/2020   Parkinsonism (HCC) 05/14/2020     REFERRING PROVIDER:  [name], MD   REFERRING DIAG: [phone] (ICD-10-CM) - Lumbar spondylosis M53.3 (ICD-10-CM) - Coccydynia   Rationale for Evaluation and Treatment: Rehabilitation  THERAPY DIAG:  Other low back pain  Muscle weakness (generalized)  Other symptoms and signs involving the musculoskeletal system  Abnormality of gait and mobility  ONSET DATE: 2022 / PT order 12/23/2023  SUBJECTIVE:  SUBJECTIVE STATEMENT:  Pt saw the neurologist earlier today.  Pt states she is very stiff today and has increased difficulty with movement. Pt is compliant with HEP and the big and loud exercises.  Pt reports the lumbar flexion stretch with the ball helps a little bit.  Pt reports pain relief for about 10-15 mins after performing home exercises.  Pt reports no improvement in pain and sx's overall.  Pt reports no improvement in pain with walking.  Pt reports she is able walk sideways better.  Pt reports improved turning.  Pt states the weakness in L LE is a little worse.       PERTINENT HISTORY:  Chronic back pain, slight anterolisthesis L3-4, L4-L5 Parkinson's disease OA, Anxiety, HTN, uterine cancer, endometrial cancer, hernia   PAIN:  NPRS:  0/10 pain currently though had pain this AM   Location:  lumbar   Pt reports the coccyx pain is constant.  8-9/10 currently.  PRECAUTIONS: Other: Parkinson's disease, slight anterolisthesis    WEIGHT BEARING RESTRICTIONS: No  FALLS:  Has patient fallen in last 6 months? Yes. Number of falls 2, at post office when her leg gave way.  Fell at home when trying to get off of her sofa   LIVING  ENVIRONMENT: Lives with: son lives with her Lives in: one level home Stairs: 1 step to enter home with rail    PLOF: Independent  PATIENT GOALS: to be able to walk again without pain   OBJECTIVE:  Note: Objective measures were completed at Evaluation unless otherwise noted.  DIAGNOSTIC FINDINGS:  MRI in January of 2023 of her lumbar spine and pelvis which revealed the following.    L3-4: Mild disc bulging is asymmetric to the left. Mild facet hypertrophy is noted bilaterally. The central canal is patent. Mild left foraminal narrowing is present.   L4-5: A mild broad-based disc protrusion extends into the foramina bilaterally. Mild bilateral facet hypertrophy is noted. Mild foraminal narrowing is present bilaterally.   L5-S1: Minimal disc bulging is present. Mild left foraminal narrowing is noted.     1. No acute osseous injury of the pelvis. 2. Mild osteoarthritis of bilateral SI joints. 3. Partial-thickness tear of the gluteus medius tendon bilaterally. 4. Small partial-thickness tear of the left hamstring origin. 5. Broad-based disc bulge at L4-5 with bilateral mild facet arthropathy. Moderate bilateral facet arthropathy at L5-S1.  MRI in January in 2023:  FINDINGS: Segmentation: 5 non rib-bearing lumbar type vertebral bodies are present. The lowest fully formed vertebral body is L5.   Alignment:  Slight anterolisthesis is noted at L3-4 and L4-5.   Vertebrae: Scattered fatty infiltration of the marrow space is consistent with age. No focal lesions are present. Marrow signal and vertebral body heights are within normal limits.   Conus medullaris and cauda equina: Conus extends to the L1 level. Conus and cauda equina appear normal.   Paraspinal and other soft tissues: Limited imaging the abdomen is unremarkable. There is no significant adenopathy. No solid organ lesions are present.   Disc levels:   L1-2: Within normal limits.   L2-3: Within normal limits    L3-4: Mild disc bulging is asymmetric to the left. Mild facet hypertrophy is noted bilaterally. The central canal is patent. Mild left foraminal narrowing is present.   L4-5: A mild broad-based disc protrusion extends into the foramina bilaterally. Mild bilateral facet hypertrophy is noted. Mild foraminal narrowing is present bilaterally.   L5-S1: Minimal disc bulging is present. Mild left foraminal narrowing is  noted.   IMPRESSION: 1. Mild left foraminal narrowing at L3-4 and L5-S1. 2. Mild foraminal narrowing bilaterally at L4-5. 3. Slight anterolisthesis at L3-4 and L4-5.   PATIENT SURVEYS:  Modified Oswestry:  MODIFIED OSWESTRY DISABILITY SCALE  Date: 03/16/2024 Score  Pain intensity 5 =  Pain medication has no effect on my pain.  2. Personal care (washing, dressing, etc.) 1 =  I can take care of myself normally, but it increases my pain.  3. Lifting 3 = Pain prevents me from lifting heavy weights, but I can manage (5) I have hardly any social life because of my pain. light to medium weights if they are conveniently positioned  4. Walking 3 =  Pain prevents me from walking more than  mile.  5. Sitting 3 =  Pain prevents me from sitting more than  hour.  6. Standing 4 =  Pain prevents me from standing more than 10 minutes.  7. Sleeping 0 = Pain does not prevent me from sleeping well.  8. Social Life 3 =  Pain prevents me from going out very often.  9. Traveling 4 = My pain restricts my travel to short necessary journeys under 1/2 hour.  10. Employment/ Homemaking 3 = Pain prevents me from doing anything but light duties.  Total 29/50 = 58%   Interpretation of scores: Score Category Description  0-20% Minimal Disability The patient can cope with most living activities. Usually no treatment is indicated apart from advice on lifting, sitting and exercise  21-40% Moderate Disability The patient experiences more pain and difficulty with sitting, lifting and standing. Travel and  social life are more difficult and they may be disabled from work. Personal care, sexual activity and sleeping are not grossly affected, and the patient can usually be managed by conservative means  41-60% Severe Disability Pain remains the main problem in this group, but activities of daily living are affected. These patients require a detailed investigation  61-80% Crippled Back pain impinges on all aspects of the patient's life. Positive intervention is required  81-100% Bed-bound  These patients are either bed-bound or exaggerating their symptoms  Bluford FORBES Zoe DELENA Karon DELENA, et al. Surgery versus conservative management of stable thoracolumbar fracture: the PRESTO feasibility RCT. [place]): [employer]; 2021 Nov. [facility] Technology Assessment, No. 25.62.) Appendix 3, Oswestry Disability Index category descriptors. Available from: FindJewel[employer]cz  Minimally Clinically Important Difference (MCID) = 12.8%  COGNITION: Overall cognitive status: Within functional limits for tasks assessed      PALPATION: Tremor in R hand  Pt has soft tissue tightness in bilat lumbar paraspinals.  Minimal tenderness on R sided lumbar which is less on L.   LUMBAR ROM:   AROM eval 10/1  Flexion WFL   Extension    Right lateral flexion 50% 60%  Left lateral flexion 50% 60%  Right rotation 40% 75%  Left rotation 40% 75%   (Blank rows = not tested)    LOWER EXTREMITY MMT:    MMT Right eval Left eval Right 10/1 Left 10/1  Hip flexion 24.3 15.3 24.4 22.5  Hip extension      Hip abduction 23.2 16.1 20.7 19.6  Hip adduction      Hip internal rotation      Hip external rotation      Knee flexion      Knee extension 26.3 25.9 24.9 18.9  Ankle dorsiflexion      Ankle plantarflexion      Ankle inversion  Ankle eversion       (Blank rows = not tested)    GAIT: Assistive device utilized: None Comments:  fwd lean with gait, decreased arm swing  bilat, decreased step length bilat  TREATMENT:                                                                                                                                05/11/24: PT reviewed current function, pain level, HEP compliance, and response to prior treatment.  PT assessed ROM and LE strength.  Nustep lvl 3 x 5 mins UE/LE's Seated clams with RTB 2x10 Standing heel raises 2x10 Sidestepping with bilat UE support x 2 laps Standing marching with UE support on airex 2x10 with SBA  Modified Oswestry Disability Index:  62%          PATIENT EDUCATION:  Education details: HEP, POC, objective findings, progress/deficits, relevant anatomy, exercise form, dx, and rationale of interventions.  Person educated: Patient Education method: Explanation, demonstration, verbal and tactile cues Education comprehension: verbalized understanding and needs further education, verbal and tactile cues required, returned demonstration  HOME EXERCISE PROGRAM: Access Code: [email or web address] URL: [email or web address] Date: 04/13/2024 Prepared by: Mose Minerva    ASSESSMENT:  CLINICAL IMPRESSION: Pt presents to treatment reporting no lumbar pain though c/o's of high level of pain in her coccyx.  Pt states her coccyx pain is constant.  She reports no improvement in pain and sx's overall.  Pt reports no improvement in pain with walking.  Pt states the weakness in L LE is a little worse.  Pt does report improved turning and she is able to walk sideways better.   Pt demonstrates improved lumbar AROM in bilat SB and rotation.  Pt is weaker in R hip abd and bilat knee extension though stronger in L hip flexion and hip abduction.  Pt's Modified Oswestry score worsened by 4%.  Pt has made minimal progress toward goals.  She met STG #1 and partially met STG #2 and LTG #1.  PT discussed objective findings, progress/deficits, and POC with pt.  Pt is in agreement to be put on hold at this time.      OBJECTIVE IMPAIRMENTS: Abnormal gait, decreased activity tolerance, decreased balance, decreased endurance, decreased mobility, difficulty walking, decreased ROM, decreased strength, hypomobility, increased fascial restrictions, and pain.   ACTIVITY LIMITATIONS: standing and locomotion level, sitting  PARTICIPATION LIMITATIONS: cleaning, shopping, and community activity  PERSONAL FACTORS: Time since onset of injury/illness/exacerbation and 1-2 comorbidities: Parkinson's, OA are also affecting patient's functional outcome.   REHAB POTENTIAL: Good  CLINICAL DECISION MAKING: Evolving/moderate complexity  EVALUATION COMPLEXITY: Moderate   GOALS:   SHORT TERM GOALS: Target date: 04/05/2024   Pt will be independent and compliant with HEP for improved pain, strength, ROM, and function.  Baseline: Goal status: GOAL MET  05/11/24  2.  Pt will demo at least a 25% improvement in bilat Sb'and and rotation AROM for improved stiffness and daily  mobility. Baseline:  Goal status: 50% MET  05/11/24  3.  Pt will report at least a 25% improvement in pain and sx's overall.   Baseline:  Goal status:  NOT MET  05/11/24  4.  Pt will progress with core exercises without adverse effects for improved core and postural strength and improved performance of daily activities and daily mobility.  Baseline:  Goal status: ONGOING    LONG TERM GOALS: Target date: 06/01/2024  Pt will demo at least a 7 lb improvement in L hip flex and abd strength and a 5 lb improvement in bilat knee extension strength for improved performance of and tolerance with functional mobility.  Baseline:  Goal status: 25% MET  05/11/24  2.  Pt will report at least a 70% improvement in pain with ambulation.   Baseline:  Goal status:  NOT MET  05/11/24  3.  Pt's Oswestry will be no > than 24% for improved self perceived disability and function. Baseline:  Goal status: NOT MET  05/11/24  4.  Pt will deny L LE weakness with her  daily ambulation.  Baseline:  Goal status: NOT MET  05/31/24    PLAN:  PT FREQUENCY:   PT DURATION: 3 weeks  PLANNED INTERVENTIONS: [id number]- PT Re-evaluation, 97750- Physical Performance Testing, 97110-Therapeutic exercises, 97530- Therapeutic activity, 97112- Neuromuscular re-education, 97535- Self Care, [id number]- Manual therapy, [phone]- Gait training, [phone]- Aquatic Therapy, [phone]- Electrical stimulation (unattended), [phone]- Electrical stimulation (manual), [id number]- Ultrasound, Patient/Family education, Balance training, Stair training, Taping, Joint mobilization, Spinal mobilization, DME instructions, Cryotherapy, and Moist heat.  PLAN FOR NEXT SESSION: Pt to be on hold to see how she does without PT.  She will continue with HEP and call us  back to schedule if she needs further PT.  Leigh Minerva III PT, DPT 05/12/24 11:49 PM

## 2024-05-12 ENCOUNTER — Ambulatory Visit (INDEPENDENT_AMBULATORY_CARE_PROVIDER_SITE_OTHER): Admitting: Nurse Practitioner

## 2024-05-12 ENCOUNTER — Telehealth: Payer: Self-pay | Admitting: *Deleted

## 2024-05-12 ENCOUNTER — Encounter: Payer: Self-pay | Admitting: Nurse Practitioner

## 2024-05-12 ENCOUNTER — Encounter (HOSPITAL_BASED_OUTPATIENT_CLINIC_OR_DEPARTMENT_OTHER): Payer: Self-pay | Admitting: Physical Therapy

## 2024-05-12 DIAGNOSIS — Z0000 Encounter for general adult medical examination without abnormal findings: Secondary | ICD-10-CM | POA: Diagnosis not present

## 2024-05-12 NOTE — Patient Instructions (Signed)
  Ashley Neal , Thank you for taking time to come for your Medicare Wellness Visit. I appreciate your ongoing commitment to your health goals. Please review the following plan we discussed and let me know if I can assist you in the future.    To check on pneumonia vaccine at local pharmacy  This is a list of the screening recommended for you and due dates:  Health Maintenance  Topic Date Due   Flu Shot  11/08/2024*   Pneumococcal Vaccine for age over 65 (1 of 1 - PCV) 12/27/2024*   COVID-19 Vaccine (2 - Janssen risk series) 08/11/2026*   Medicare Annual Wellness Visit  05/12/2025   DTaP/Tdap/Td vaccine (2 - Td or Tdap) 07/11/2028   Hepatitis C Screening  Completed   Zoster (Shingles) Vaccine  Completed   HPV Vaccine  Aged Out   Meningitis B Vaccine  Aged Out   Breast Cancer Screening  Discontinued   DEXA scan (bone density measurement)  Discontinued  *Topic was postponed. The date shown is not the original due date.

## 2024-05-12 NOTE — Progress Notes (Signed)
 Subjective:   Ashley Neal is a 78 y.o. female who presents for Medicare Annual (Subsequent) preventive examination.  Visit Complete: Virtual I connected with  Damien LULLA Blalock on 05/12/24 by a video and audio enabled telemedicine application and verified that I am speaking with the correct person using two identifiers.  Patient Location: Home  Provider Location: Office/Clinic  I discussed the limitations of evaluation and management by telemedicine. The patient expressed understanding and agreed to proceed.  Vital Signs: Because this visit was a virtual/telehealth visit, some criteria may be missing or patient reported. Any vitals not documented were not able to be obtained and vitals that have been documented are patient reported.  Cardiac Risk Factors include: advanced age (>56men, >74 women);dyslipidemia;hypertension;obesity (BMI >30kg/m2);sedentary lifestyle     Objective:    Today's Vitals   05/12/24 1040  PainSc: 5    There is no height or weight on file to calculate BMI.     05/12/2024   10:31 AM 03/16/2024    3:46 PM 02/10/2024   11:08 AM 02/10/2024   10:34 AM 12/08/2023    2:42 PM 08/24/2023    2:45 PM 08/24/2023   11:38 AM  Advanced Directives  Does Patient Have a Medical Advance Directive? Yes Yes Yes Yes Yes Yes Yes  Type of Estate agent of [place];Living will;Out of facility DNR (pink MOST or yellow form) Healthcare Power of [place];Living will Healthcare Power of [place];Living will Healthcare Power of [place];Living will Living will Healthcare Power of [place];Living will;Out of facility DNR (pink MOST or yellow form) Living will;Out of facility DNR (pink MOST or yellow form)  Does patient want to make changes to medical advance directive? No - Patient declined    No - Patient declined No - Patient declined No - Patient declined  Copy of Healthcare Power of Attorney in Chart? No - copy requested     Yes - validated most recent copy scanned  in chart (See row information)   Pre-existing out of facility DNR order (yellow form or pink MOST form)      Pink MOST form placed in chart (order not valid for inpatient use) Pink MOST form placed in chart (order not valid for inpatient use)    Current Medications (verified) Outpatient Encounter Medications as of 05/12/2024  Medication Sig   carbidopa -levodopa  (SINEMET  IR) 25-100 MG tablet Take 1 tablet by mouth 5 (five) times daily.   escitalopram (LEXAPRO) 20 MG tablet Take 20 mg by mouth daily.   Multiple Vitamin (MULTI-VITAMIN) tablet Take 1 tablet by mouth daily.   No facility-administered encounter medications on file as of 05/12/2024.    Allergies (verified) Gabapentin, Hydrocodone -acetaminophen , Lyrica  [pregabalin ], and Codeine   History: Past Medical History:  Diagnosis Date   Anxiety    Basal cell carcinoma    Depression    Hypertension    Neuromuscular disorder (HCC)    Osteoarthritis    Osteopenia    Parkinson disease (HCC)    Pneumonia    Tremors of nervous system    legs   uterine ca 10/08/2021   also endometrial cancer   Past Surgical History:  Procedure Laterality Date   CARPAL TUNNEL RELEASE Bilateral 1996/1997   CYSTOSCOPY N/A 10/08/2021   Procedure: CYSTOSCOPY;  Surgeon: Viktoria Comer SAUNDERS, MD;  Location: [facility];  Service: Gynecology;  Laterality: N/A;   ROBOTIC ASSISTED TOTAL HYSTERECTOMY WITH BILATERAL SALPINGO OOPHERECTOMY N/A 10/08/2021   Procedure: XI ROBOTIC ASSISTED TOTAL HYSTERECTOMY WITH BILATERAL SALPINGO OOPHORECTOMY;SENTINEL LYMPH NODE  INJECTION;  Surgeon: Viktoria Comer SAUNDERS, MD;  Location: [facility];  Service: Gynecology;  Laterality: N/A;   SQUAMOUS CELL CARCINOMA EXCISION Left 2017   Left jawline   TUBAL LIGATION  1980's   Family History  Problem Relation Age of Onset   Endometrial cancer Mother    Diabetes Mother    Alzheimer's disease Father    Coronary artery disease Father    Diabetes Sister    COPD Sister    Diabetes Sister     Heart disease Brother    Other Brother        MVA    Parkinson's disease Brother    Heart disease Brother    Alcohol abuse Brother    Heart disease Brother    Parkinson's disease Maternal Aunt    Breast cancer Maternal Aunt    Colon cancer Paternal Aunt    Parkinson's disease Paternal Aunt    Lung cancer Paternal Uncle    Healthy Son    Healthy Son    Prostate cancer Neg Hx    Pancreatic cancer Neg Hx    Ovarian cancer Neg Hx    Social History   Socioeconomic History   Marital status: Married    Spouse name: Gwenn Patella   Number of children: 2   Years of education: 16   Highest education level: Bachelor's degree (e.g., BA, AB, BS)  Occupational History   Occupation: retired    Comment: Advertising account planner  Tobacco Use   Smoking status: Never    Passive exposure: Never   Smokeless tobacco: Never  Vaping Use   Vaping status: Never Used  Substance and Sexual Activity   Alcohol use: Not Currently    Comment: rarely   Drug use: Not Currently   Sexual activity: Not Currently    Partners: Male  Other Topics Concern   Not on file  Social History Narrative   Not on file   Social Drivers of Health   Financial Resource Strain: Low Risk  (05/01/2024)   Overall Financial Resource Strain (CARDIA)    Difficulty of Paying Living Expenses: Not very hard  Food Insecurity: No Food Insecurity (05/01/2024)   Hunger Vital Sign    Worried About Running Out of Food in the Last Year: Never true    Ran Out of Food in the Last Year: Never true  Transportation Needs: No Transportation Needs (05/01/2024)   PRAPARE - Administrator, Civil Service (Medical): No    Lack of Transportation (Non-Medical): No  Physical Activity: Sufficiently Active (05/01/2024)   Exercise Vital Sign    Days of Exercise per Week: 6 days    Minutes of Exercise per Session: 40 min  Stress: No Stress Concern Present (05/01/2024)   [employer] of Occupational Health - Occupational Stress  Questionnaire    Feeling of Stress: Only a little  Social Connections: Socially Integrated (05/01/2024)   Social Connection and Isolation Panel    Frequency of Communication with Friends and Family: More than three times a week    Frequency of Social Gatherings with Friends and Family: Twice a week    Attends Religious Services: More than 4 times per year    Active Member of [employer] or Organizations: Yes    Attends Engineer, structural: More than 4 times per year    Marital Status: Married    Tobacco Counseling Counseling given: Not Answered   Clinical Intake:  Pre-visit preparation completed: Yes  Pain : 0-10 Pain Score: 5  Pain Type: Chronic pain Pain Location: Back Pain Orientation: Right, Left Pain Onset: More than a month ago Pain Frequency: Constant     BMI - recorded: 30 Nutritional Status: BMI > 30  Obese Nutritional Risks: Unintentional weight gain  How often do you need to have someone help you when you read instructions, pamphlets, or other written materials from your doctor or pharmacy?: 1 - Never         Activities of Daily Living    05/12/2024   10:40 AM 05/10/2024   11:15 AM  In your present state of health, do you have any difficulty performing the following activities:  Hearing? 0 0  Vision? 1 1  Difficulty concentrating or making decisions? 0 0  Walking or climbing stairs? 1 1  Dressing or bathing? 1 1  Doing errands, shopping? 0 0  Preparing Food and eating ? N N  Using the Toilet? N N  In the past six months, have you accidently leaked urine? Y Y  Do you have problems with loss of bowel control? N N  Managing your Medications? N N  Managing your Finances? N N  Housekeeping or managing your Housekeeping? CINDERELLA CINDERELLA    Patient Care Team: Caro Harlene POUR, NP as PCP - General (Geriatric Medicine) Dann Candyce RAMAN, MD as PCP - Cardiology (Cardiology) Morgan, Clayborne CROME, RN as [facility]  Indicate  any recent Medical Services you may have received from other than [facility] providers in the past year (date may be approximate).     Assessment:   This is a routine wellness examination for Kenneshia.  Hearing/Vision screen Vision Screening - Comments:: Dr. Fleeta Last Exam: 03/2024   Goals Addressed   None    Depression Screen    05/12/2024   10:30 AM 01/08/2024    1:55 PM 01/08/2024    1:27 PM 12/28/2023    1:17 PM 12/08/2023    2:41 PM 11/25/2023    9:09 AM 08/24/2023    2:44 PM  PHQ 2/9 Scores  PHQ - 2 Score 0 0 0 2 0 0   PHQ- 9 Score    4     Exception Documentation       Other- indicate reason in comment box  Not completed       Under the care of counslor    Fall Risk    05/12/2024   10:29 AM 05/10/2024   11:15 AM 04/25/2024   12:36 PM 02/15/2024   12:13 PM 01/08/2024    1:21 PM  Fall Risk   Falls in the past year? 1 1 1 1 1   Number falls in past yr: 1 1 0 0 0  Injury with Fall? 1 1 1 1 1   Risk for fall due to : Impaired balance/gait  History of fall(s);Impaired balance/gait;Impaired mobility History of fall(s);Impaired balance/gait;Impaired mobility History of fall(s);Impaired balance/gait;Impaired mobility  Follow up Falls evaluation completed  Falls evaluation completed;Education provided;Falls prevention discussed Falls evaluation completed;Education provided;Falls prevention discussed;Follow up appointment Falls evaluation completed;Education provided    MEDICARE RISK AT HOME: Medicare Risk at Home Any stairs in or around the home?: Yes If so, are there any without handrails?: Yes Home free of loose throw rugs in walkways, pet beds, electrical cords, etc?: Yes Adequate lighting in your home to reduce risk of falls?: Yes Life alert?: No Use of a cane, walker or w/c?: No Grab bars in the bathroom?: Yes Shower chair or bench in shower?: Yes Elevated toilet  seat or a handicapped toilet?: No  TIMED UP AND GO:  Was the test performed?  No    Cognitive Function:     05/04/2023   10:09 AM  MMSE - Mini Mental State Exam  Orientation to time 5  Orientation to Place 5  Registration 3  Attention/ Calculation 5  Recall 3  Language- name 2 objects 2  Language- repeat 1  Language- follow 3 step command 3  Language- read & follow direction 0  Language-read & follow direction-comments Did not read sentence out loud  Write a sentence 1  Copy design 1  Total score 29        05/12/2024   10:31 AM  6CIT Screen  What Year? 0 points  What month? 0 points  What time? 0 points  Count back from 20 0 points  Months in reverse 0 points  Repeat phrase 0 points  Total Score 0 points    Immunizations Immunization History  Administered Date(s) Administered   Janssen (J&J) SARS-COV-2 Vaccination 10/27/2019   Tdap 07/11/2018   Unspecified SARS-COV-2 Vaccination 10/27/2019   Zoster Recombinant(Shingrix) 08/05/2018, 11/12/2018    TDAP status: Up to date  Flu Vaccine status: Declined, Education has been provided regarding the importance of this vaccine but patient still declined. Advised may receive this vaccine at local pharmacy or Health Dept. Aware to provide a copy of the vaccination record if obtained from local pharmacy or Health Dept. Verbalized acceptance and understanding.  Pneumococcal vaccine status: Declined,  Education has been provided regarding the importance of this vaccine but patient still declined. Advised may receive this vaccine at local pharmacy or Health Dept. Aware to provide a copy of the vaccination record if obtained from local pharmacy or Health Dept. Verbalized acceptance and understanding.   Covid-19 vaccine status: Declined, Education has been provided regarding the importance of this vaccine but patient still declined. Advised may receive this vaccine at local pharmacy or Health Dept.or vaccine clinic. Aware to provide a copy of the vaccination record if obtained from local pharmacy or Health Dept. Verbalized acceptance and  understanding.  Qualifies for Shingles Vaccine? Yes   Zostavax completed No   Shingrix Completed?: Yes  Screening Tests Health Maintenance  Topic Date Due   Influenza Vaccine  11/08/2024 (Originally 03/11/2024)   Pneumococcal Vaccine: 50+ Years (1 of 1 - PCV) 12/27/2024 (Originally 07/13/1996)   COVID-19 Vaccine (2 - Janssen risk series) 08/11/2026 (Originally 11/24/2019)   Medicare Annual Wellness (AWV)  05/12/2025   DTaP/Tdap/Td (2 - Td or Tdap) 07/11/2028   Hepatitis C Screening  Completed   Zoster Vaccines- Shingrix  Completed   HPV VACCINES  Aged Out   Meningococcal B Vaccine  Aged Out   Mammogram  Discontinued   DEXA SCAN  Discontinued    Health Maintenance  There are no preventive care reminders to display for this patient.   Colorectal cancer screening: No longer required.   Mammogram status: No longer required due to age.  Declines bone density  Lung Cancer Screening: (Low Dose CT Chest recommended if Age 57-80 years, 20 pack-year currently smoking OR have quit w/in 15years.) does not qualify.   Lung Cancer Screening Referral: na  Additional Screening:  Hepatitis C Screening: does qualify; Completed   Vision Screening: Recommended annual ophthalmology exams for early detection of glaucoma and other disorders of the eye. Is the patient up to date with their annual eye exam?  Yes  Who is the provider or what is the name of the  office in which the patient attends annual eye exams? Dr Fleeta If pt is not established with a provider, would they like to be referred to a provider to establish care? No .   Dental Screening: Recommended annual dental exams for proper oral hygiene   Community Resource Referral / Chronic Care Management: CRR required this visit?  No   CCM required this visit?  No     Plan:     I have personally reviewed and noted the following in the patient's chart:   Medical and social history Use of alcohol, tobacco or illicit drugs  Current  medications and supplements including opioid prescriptions. Patient is not currently taking opioid prescriptions. Functional ability and status Nutritional status Physical activity Advanced directives List of other physicians Hospitalizations, surgeries, and ER visits in previous 12 months Vitals Screenings to include cognitive, depression, and falls Referrals and appointments  In addition, I have reviewed and discussed with patient certain preventive protocols, quality metrics, and best practice recommendations. A written personalized care plan for preventive services as well as general preventive health recommendations were provided to patient.     Harlene MARLA An, NP   05/12/2024   After Visit Summary: (MyChart) Due to this being a telephonic visit, the after visit summary with patients personalized plan was offered to patient via MyChart

## 2024-05-12 NOTE — Telephone Encounter (Signed)
 Ms. wava, kildow are scheduled for a virtual visit with your provider today.    Just as we do with appointments in the office, we must obtain your consent to participate.  Your consent will be active for this visit and any virtual visit you Jiya Kissinger have with one of our providers in the next 365 days.    If you have a MyChart account, I can also send a copy of this consent to you electronically.  All virtual visits are billed to your insurance company just like a traditional visit in the office.  As this is a virtual visit, video technology does not allow for your provider to perform a traditional examination.  This Shogo Larkey limit your provider's ability to fully assess your condition.  If your provider identifies any concerns that need to be evaluated in person or the need to arrange testing such as labs, EKG, etc, we will make arrangements to do so.    Although advances in technology are sophisticated, we cannot ensure that it will always work on either your end or our end.  If the connection with a video visit is poor, we Rebekkah Powless have to switch to a telephone visit.  With either a video or telephone visit, we are not always able to ensure that we have a secure connection.   I need to obtain your verbal consent now.   Are you willing to proceed with your visit today?   Ashley Neal has provided verbal consent on 05/12/2024 for a virtual visit (video or telephone).   Ashley Neal, CMA 05/12/2024  10:34 AM

## 2024-05-12 NOTE — Progress Notes (Signed)
 This service is provided via telemedicine  No vital signs collected/recorded due to the encounter was a telemedicine visit.   Location of patient (ex: home, work):  Home  Patient consents to a telephone visit:  Yes  Location of the provider (ex: office, home):  Office [place].   Name of any referring provider:  na  Names of all persons participating in the telemedicine service and their role in the encounter:  Ashley Neal, Patient, [name], CMA, Harlene An, NP  Time spent on call:  7:10

## 2024-05-13 ENCOUNTER — Inpatient Hospital Stay: Attending: Gynecologic Oncology | Admitting: Gynecologic Oncology

## 2024-05-13 ENCOUNTER — Encounter: Payer: Self-pay | Admitting: Gynecologic Oncology

## 2024-05-13 VITALS — BP 130/74 | HR 85 | Temp 98.0°F | Resp 19 | Wt 162.2 lb

## 2024-05-13 DIAGNOSIS — Z90722 Acquired absence of ovaries, bilateral: Secondary | ICD-10-CM | POA: Insufficient documentation

## 2024-05-13 DIAGNOSIS — Z9079 Acquired absence of other genital organ(s): Secondary | ICD-10-CM | POA: Diagnosis not present

## 2024-05-13 DIAGNOSIS — C541 Malignant neoplasm of endometrium: Secondary | ICD-10-CM

## 2024-05-13 DIAGNOSIS — Z8542 Personal history of malignant neoplasm of other parts of uterus: Secondary | ICD-10-CM | POA: Insufficient documentation

## 2024-05-13 DIAGNOSIS — Z90710 Acquired absence of both cervix and uterus: Secondary | ICD-10-CM | POA: Insufficient documentation

## 2024-05-13 DIAGNOSIS — Z923 Personal history of irradiation: Secondary | ICD-10-CM | POA: Insufficient documentation

## 2024-05-13 NOTE — Patient Instructions (Signed)
It was good to see you today.  I do not see or feel any evidence of cancer recurrence on your exam.  We will see you for follow-up in 6 months.  As always, if you develop any new and concerning symptoms before your next visit, please call to see me sooner.

## 2024-05-13 NOTE — Progress Notes (Signed)
 Gynecologic Oncology Return Clinic Visit  05/13/24  Reason for Visit: Surveillance in the setting of endometrial cancer   Treatment History: Oncology History Overview Note  MMR IHC intact MS stable   Endometrial cancer (HCC)  08/31/2021 Imaging   MRI of the pelvis performed for low back pain on 08/31/2021 with incidental finding of endometrial thickening up to 1 cm, suboptimally evaluated.  There is no pelvic free fluid.  No adenopathy.  2.5 cm right ovarian cyst noted.  Other MRI findings: no hip fracture, dislocation or avascular necrosis.  There is mild osteoarthritis of bilateral hips.  There is a large high-grade partial-thickness tear of the left hamstring origin.  Partial-thickness tear of the left gluteus medius tendon insertion.  Mild osteoarthritis of bilateral SI joints also noted.   09/04/2021 Imaging   Pelvic ultrasound exam on 09/04/2021 shows a uterus measuring 6.4 x 3.1 x 4.2 cm with an endometrial lining of 1.1 cm with fluid.  Irregular appearance of the right ovary with simple follicle measuring up to 5 mm, avascular.  Simple cyst adjacent to the right ovary measures up to 2.7 cm and also avascular.  Left ovary noted to be normal.   09/12/2021 Initial Biopsy   EMB: showed rare atypical glands, predominantly mucus with few admixed benign endocervical glands.  Although the rare glandular fragments with cribriform pattern and mild cytologic atypia are extremely scant, they are concerning for at least endometrial hyperplasia.   10/08/2021 Surgery   TRH/BSO, SLN injection with no mapping bilaterally, cystoscopy No residual hyperplasia seen on frozen, no malignancy   Findings:  On EUA, small mobile uterus. Normal upper abdominal survey. Normal appearing omentum and small and large bowel. Normal appendix. Uterus 8cm and normal in appearance with small fundal fibroid. Normal bilateral adnexa with evidence of prior BTL. ICG seen along posterior cervix, mapping not appreciated in either  pelvic LN basin. No obvious adenopathy. Given some difficulty with the uterine manipulator and trouble initially identifying plane between bladder and LUS and cervix, cystoscopy was performed. Bladder dome intact, good efflux noted from bilateral ureteral orifices.    10/08/2021 Pathology Results   A. UTERUS, CERVIX, BILATERAL FALLOPIAN TUBES AND OVARIES:  Invasive well differentiated mucinous endometrial adenocarcinoma, FIGO 1  Tumor invades greater than 50% of the myometrium (11 mm /18 mm)  Tumor arises within complex atypical mucinous hyperplasia involving a  polyp and the anterior and posterior endometrium  Adenomyosis  Background inactive endometrium with cystic change  Benign serous cyst papillary adenofibroma of right ovary  Chronic cervicitis with nabothian cysts  Benign left ovary and right and left fallopian tubes   ONCOLOGY TABLE:   UTERUS, CARCINOMA OR CARCINOSARCOMA: Resection   Procedure: Total hysterectomy and bilateral salpingo-oophorectomy  Histologic Type: Mucinous endometrial adenocarcinoma  Histologic Grade: Well differentiated, FIGO 1  Myometrial Invasion: Present       Depth of Myometrial Invasion (mm): 11 mm       Myometrial Thickness (mm): 18 mm       Percentage of Myometrial Invasion: 61%  Uterine Serosa Involvement: Not identified  Cervical stromal Involvement:[Not identified  Extent of involvement of other tissue/organs: Not identified  Peritoneal/Ascitic Fluid: Not submitted  Lymphovascular Invasion: Not identified  Regional Lymph Nodes: Not applicable (no lymph nodes submitted or found)   Distant Metastasis: Not applicable  Pathologic Stage Classification (pTNM, AJCC 8th Edition): pT1b, pN n/a  Ancillary Studies: MMR / MSI testing will be ordered  Representative Tumor Block: A5, A6  Comment(s): None    10/16/2021  Initial Diagnosis   Endometrial cancer (HCC)   11/06/2021 Surgery   Robotic-assisted bilateral pelvic lymph node dissection, right  para-aortic lymph node dissection, cystoscopy  Findings: On EUA, well-healed vaginal cuff.  Normal upper abdominal survey.  Normal-appearing omentum, small and large bowel.  Some filmy adhesions of the sigmoid epiploica to the left pelvic sidewall.  Significant fibrosis noted of bilateral pelvic sidewalls and the retroperitoneal spaces.  Nodular tissue along some areas recent surgical dissection, presumed to be inflammatory from surgery.  No obvious adenopathy.  Fibrosis made pelvic lymph node dissection very challenging with significant adherence of minimal lymphatic tissue to the right external iliac vein.  Anatomy quite challenging to delineate in the setting of this fibrosis as well as recent surgery.  Along the para-aortic lymph node beds, no significant tissue noted along the left aspect and due to retroperitoneal fibrosis, further dissection did not seem safe.  Along the right, what was presumed to be the IVC was displaced laterally.  Cystoscopy, bladder dome noted to be intact, good efflux noted from bilateral ureteral orifices.   11/06/2021 Pathology Results   A. LYMPH NODE, LEFT PELVIC, EXCISION:  - Four lymph nodes, negative for carcinoma (0/4)   B. PERITONEUM, BIOPSY:  - Peritonealized soft tissue with focal fat necrosis  - Negative for carcinoma   C. LYMPH NODE, RIGHT PELVIC, EXCISION:  - Five lymph nodes, negative for carcinoma (0/5)   D. ROUND LIGAMENT REMNANT, EXCISION:  - Negative for carcinoma   E. PARA-AORTIC, RIGHT, EXCISION:  - Lymph node, negative for carcinoma (0   12/18/2021 - 01/15/2022 Radiation Therapy   Vaginal brachytherapy - 30 Gy     Interval History: Struggling related to Parkinson's, especially back pain.  Denies any vaginal bleeding.  Has intermittent alternating diarrhea and constipation, no change from baseline.  Past Medical/Surgical History: Past Medical History:  Diagnosis Date   Anxiety    Basal cell carcinoma    Depression    Hypertension     Neuromuscular disorder (HCC)    Osteoarthritis    Osteopenia    Parkinson disease (HCC)    Pneumonia    Tremors of nervous system    legs   uterine ca 10/08/2021   also endometrial cancer    Past Surgical History:  Procedure Laterality Date   CARPAL TUNNEL RELEASE Bilateral 1996/1997   CYSTOSCOPY N/A 10/08/2021   Procedure: CYSTOSCOPY;  Surgeon: Viktoria Comer SAUNDERS, MD;  Location: [facility];  Service: Gynecology;  Laterality: N/A;   ROBOTIC ASSISTED TOTAL HYSTERECTOMY WITH BILATERAL SALPINGO OOPHERECTOMY N/A 10/08/2021   Procedure: XI ROBOTIC ASSISTED TOTAL HYSTERECTOMY WITH BILATERAL SALPINGO OOPHORECTOMY;SENTINEL LYMPH NODE INJECTION;  Surgeon: Viktoria Comer SAUNDERS, MD;  Location: [facility];  Service: Gynecology;  Laterality: N/A;   SQUAMOUS CELL CARCINOMA EXCISION Left 2017   Left jawline   TUBAL LIGATION  1980's    Family History  Problem Relation Age of Onset   Endometrial cancer Mother    Diabetes Mother    Alzheimer's disease Father    Coronary artery disease Father    Diabetes Sister    COPD Sister    Diabetes Sister    Heart disease Brother    Other Brother        MVA    Parkinson's disease Brother    Heart disease Brother    Alcohol abuse Brother    Heart disease Brother    Parkinson's disease Maternal Aunt    Breast cancer Maternal Aunt    Colon cancer Paternal Aunt  Parkinson's disease Paternal Aunt    Lung cancer Paternal Uncle    Healthy Son    Healthy Son    Prostate cancer Neg Hx    Pancreatic cancer Neg Hx    Ovarian cancer Neg Hx     Social History   Socioeconomic History   Marital status: Married    Spouse name: Gwenn Patella   Number of children: 2   Years of education: 16   Highest education level: Bachelor's degree (e.g., BA, AB, BS)  Occupational History   Occupation: retired    Comment: Advertising account planner  Tobacco Use   Smoking status: Never    Passive exposure: Never   Smokeless tobacco: Never  Vaping Use   Vaping status: Never Used   Substance and Sexual Activity   Alcohol use: Not Currently    Comment: rarely   Drug use: Not Currently   Sexual activity: Not Currently    Partners: Male  Other Topics Concern   Not on file  Social History Narrative   Not on file   Social Drivers of Health   Financial Resource Strain: Low Risk  (05/01/2024)   Overall Financial Resource Strain (CARDIA)    Difficulty of Paying Living Expenses: Not very hard  Food Insecurity: No Food Insecurity (05/01/2024)   Hunger Vital Sign    Worried About Running Out of Food in the Last Year: Never true    Ran Out of Food in the Last Year: Never true  Transportation Needs: No Transportation Needs (05/01/2024)   PRAPARE - Administrator, Civil Service (Medical): No    Lack of Transportation (Non-Medical): No  Physical Activity: Sufficiently Active (05/01/2024)   Exercise Vital Sign    Days of Exercise per Week: 6 days    Minutes of Exercise per Session: 40 min  Stress: No Stress Concern Present (05/01/2024)   [employer] of Occupational Health - Occupational Stress Questionnaire    Feeling of Stress: Only a little  Social Connections: Socially Integrated (05/01/2024)   Social Connection and Isolation Panel    Frequency of Communication with Friends and Family: More than three times a week    Frequency of Social Gatherings with Friends and Family: Twice a week    Attends Religious Services: More than 4 times per year    Active Member of [employer] or Organizations: Yes    Attends Engineer, structural: More than 4 times per year    Marital Status: Married    Current Medications:  Current Outpatient Medications:    carbidopa -levodopa  (SINEMET  IR) 25-100 MG tablet, Take 1 tablet by mouth 5 (five) times daily., Disp: , Rfl:    escitalopram (LEXAPRO) 20 MG tablet, Take 20 mg by mouth daily., Disp: , Rfl:    Multiple Vitamin (MULTI-VITAMIN) tablet, Take 1 tablet by mouth daily., Disp: , Rfl:   Review of Systems: Denies  appetite changes, fevers, chills, fatigue, unexplained weight changes. Denies hearing loss, neck lumps or masses, mouth sores, ringing in ears or voice changes. Denies cough or wheezing.  Denies shortness of breath. Denies chest pain or palpitations. Denies leg swelling. Denies abdominal distention, pain, blood in stools, constipation, diarrhea, nausea, vomiting, or early satiety. Denies pain with intercourse, dysuria, frequency, hematuria or incontinence. Denies hot flashes, pelvic pain, vaginal bleeding or vaginal discharge.   Denies joint pain, back pain or muscle pain/cramps. Denies itching, rash, or wounds. Denies dizziness, headaches, numbness or seizures. Denies swollen lymph nodes or glands, denies easy bruising or bleeding. Denies  anxiety, depression, confusion, or decreased concentration.  Physical Exam: BP 130/74 (BP Location: Left Arm, Patient Position: Sitting)   Pulse 85   Temp 98 F (36.7 C) (Oral)   Resp 19   Wt 162 lb 3.2 oz (73.6 kg)   SpO2 98%   BMI 30.65 kg/m  General: Alert, oriented, no acute distress. HEENT: Normocephalic, atraumatic, sclera anicteric. Chest: Clear to auscultation bilaterally.  Wheezes or rhonchi. Cardiovascular: Regular rate and rhythm, no murmurs. Abdomen: soft, nontender.  Normoactive bowel sounds.  No masses or hepatosplenomegaly appreciated.  Well-healed incisions. Small nontender 3 cm periumbilical hernia. Extremities: Grossly normal range of motion.  Warm, well perfused.  No edema bilaterally. Skin: No rashes or lesions noted. Lymphatics: No cervical, supraclavicular, or inguinal adenopathy. GU: Normal appearing external genitalia without erythema, excoriation, or lesions.  Speculum exam performed with patient sitting almost straight up near the edge of the bed with some difficulty.  Speculum exam reveals moderately atrophic vaginal mucosa, some radiation changes noted at the vaginal apex.  Bimanual exam reveals no nodularity or masses.   Rectovaginal exam confirms findings.  Laboratory & Radiologic Studies: None new  Assessment & Plan: Ashley Neal is a 78 y.o. woman with Stage IB grade 1 mucinous endometrial adenocarcinoma who presents for follow-up after completion of adjuvant vaginal brachytherapy in 01/2022. MSS, MMRp.   Patient is doing well from a cancer standpoint. NED on exam.   She continues to struggle with significant back pain, worsening of her Parkinson's.    We will continued with NCCN surveillance recommendations transition to visits every 6 months since she is nearly 2.5 years out from completion of adjuvant therapy. She will see Melissa in 6 months for follow-up and see me in 1 year.  We discussed signs and symptoms that would be concerning for cancer recurrence and I stressed the importance of calling if she develops any of these.  20 minutes of total time was spent for this patient encounter, including preparation, face-to-face counseling with the patient and coordination of care, and documentation of the encounter.  Comer Dollar, MD  Division of Gynecologic Oncology  Department of Obstetrics and Gynecology  [facility]

## 2024-05-18 ENCOUNTER — Encounter: Admitting: Physical Medicine & Rehabilitation

## 2024-05-19 ENCOUNTER — Telehealth: Payer: Self-pay

## 2024-05-19 NOTE — Patient Instructions (Signed)
 Titania V Likes - I am sorry I was unable to reach you today for our scheduled appointment. I work with Caro Harlene POUR, NP and am calling to support your healthcare needs. Please contact me at [phone] at your earliest convenience. I look forward to speaking with you soon.   Thank you,   Clayborne Ly RN BSN CCM [place]  [facility], [facility] Nurse Care Coordinator  Direct Dial: [phone] Website: [email or web address]

## 2024-05-31 DIAGNOSIS — G20A1 Parkinson's disease without dyskinesia, without mention of fluctuations: Secondary | ICD-10-CM | POA: Diagnosis not present

## 2024-05-31 DIAGNOSIS — M5440 Lumbago with sciatica, unspecified side: Secondary | ICD-10-CM | POA: Diagnosis not present

## 2024-06-06 ENCOUNTER — Telehealth: Payer: Self-pay | Admitting: Neurology

## 2024-06-06 NOTE — Telephone Encounter (Signed)
 I recommend she talk to her PCP about seeing urology for urinary incontinence. She can increase the C/L back to 1.5 pills 5 times a day.

## 2024-06-06 NOTE — Telephone Encounter (Signed)
 I called pt and LMVM detailed relating to Dr. Obie recommendation.  As well as on mychart as well plan.  See pcp for referral for urinary incontinence and referral to urology.  Increase C/L 1.5 tabs po 5 x daily. Pt to all back if needed.

## 2024-06-06 NOTE — Telephone Encounter (Signed)
 Re: pt's carbidopa -levodopa  (SINEMET  IR) 25-100 MG tablet she states her tremors are worsening her sleep quality is declining and she feels she is declining re: urinary incont..  Pt does not want to double the medication as she feels she wont be able to do anything, but she does want to discuss an adjustment, please call.

## 2024-06-07 ENCOUNTER — Telehealth: Payer: Self-pay

## 2024-06-15 ENCOUNTER — Encounter: Payer: Self-pay | Admitting: Physical Medicine & Rehabilitation

## 2024-06-15 ENCOUNTER — Encounter: Attending: Physical Medicine & Rehabilitation | Admitting: Physical Medicine & Rehabilitation

## 2024-06-15 VITALS — BP 128/81 | HR 91 | Ht 61.0 in

## 2024-06-15 DIAGNOSIS — G20A1 Parkinson's disease without dyskinesia, without mention of fluctuations: Secondary | ICD-10-CM | POA: Diagnosis not present

## 2024-06-15 DIAGNOSIS — M533 Sacrococcygeal disorders, not elsewhere classified: Secondary | ICD-10-CM | POA: Diagnosis not present

## 2024-06-15 DIAGNOSIS — M47816 Spondylosis without myelopathy or radiculopathy, lumbar region: Secondary | ICD-10-CM | POA: Diagnosis not present

## 2024-06-15 MED ORDER — BUPRENORPHINE 5 MCG/HR TD PTWK: 1.0000 | MEDICATED_PATCH | TRANSDERMAL | 2 refills | Status: DC

## 2024-06-15 NOTE — Patient Instructions (Signed)
  Instructions for [facility] application:    [email or web address]

## 2024-06-15 NOTE — Progress Notes (Signed)
 Subjective:    Patient ID: Ashley Neal, female    DOB: 12-May-1946, 78 y.o.   MRN: [id number]  HPI  Patient is here in follow-up of her chronic pain syndrome in the setting of her Parkinson's disease.  I last saw her in May at which time we made a referral to outpatient physical therapy. She made it to therapy which wrapped up in October. She exercises at home. She made exercise stations at home where she does different things. She was able to add a few exercises she learned from therapy. She increased her sinemet  to 1.5 tabs (25/100) 5x daily which has helped her initiation and movement, pain to  certain extent.  However, pain is persistent.  She is not sure what else that she can do from a standpoint of specific treatments for her back.  She wants to be more active but her pain is often limiting.  She feels that her Parkinson's plays a role in increasing her pain as well.     Pain Inventory Average Pain 7 Pain Right Now 7 My pain is constant, burning, stabbing, and aching  In the last 24 hours, has pain interfered with the following? General activity 6 Relation with others 8 Enjoyment of life 9 What TIME of day is your pain at its worst? morning , daytime, evening, and night Sleep (in general) Poor  Pain is worse with: walking, bending, and standing Pain improves with: rest and heat/ice Relief from Meds: 1  Family History  Problem Relation Age of Onset   Endometrial cancer Mother    Diabetes Mother    Alzheimer's disease Father    Coronary artery disease Father    Diabetes Sister    COPD Sister    Diabetes Sister    Heart disease Brother    Other Brother        MVA    Parkinson's disease Brother    Heart disease Brother    Alcohol abuse Brother    Heart disease Brother    Parkinson's disease Maternal Aunt    Breast cancer Maternal Aunt    Colon cancer Paternal Aunt    Parkinson's disease Paternal Aunt    Lung cancer Paternal Uncle    Healthy Son    Healthy Son     Prostate cancer Neg Hx    Pancreatic cancer Neg Hx    Ovarian cancer Neg Hx    Social History   Socioeconomic History   Marital status: Married    Spouse name: Gwenn Patella   Number of children: 2   Years of education: 16   Highest education level: Bachelor's degree (e.g., BA, AB, BS)  Occupational History   Occupation: retired    Comment: advertising account planner  Tobacco Use   Smoking status: Never    Passive exposure: Never   Smokeless tobacco: Never  Vaping Use   Vaping status: Never Used  Substance and Sexual Activity   Alcohol use: Not Currently    Comment: rarely   Drug use: Not Currently   Sexual activity: Not Currently    Partners: Male  Other Topics Concern   Not on file  Social History Narrative   Not on file   Social Drivers of Health   Financial Resource Strain: Low Risk  (05/01/2024)   Overall Financial Resource Strain (CARDIA)    Difficulty of Paying Living Expenses: Not very hard  Food Insecurity: No Food Insecurity (05/01/2024)   Hunger Vital Sign    Worried About Running Out  of Food in the Last Year: Never true    Ran Out of Food in the Last Year: Never true  Transportation Needs: No Transportation Needs (05/01/2024)   PRAPARE - Administrator, Civil Service (Medical): No    Lack of Transportation (Non-Medical): No  Physical Activity: Sufficiently Active (05/01/2024)   Exercise Vital Sign    Days of Exercise per Week: 6 days    Minutes of Exercise per Session: 40 min  Stress: No Stress Concern Present (05/01/2024)   [employer] of Occupational Health - Occupational Stress Questionnaire    Feeling of Stress: Only a little  Social Connections: Socially Integrated (05/01/2024)   Social Connection and Isolation Panel    Frequency of Communication with Friends and Family: More than three times a week    Frequency of Social Gatherings with Friends and Family: Twice a week    Attends Religious Services: More than 4 times per year    Active  Member of Clubs or Organizations: Yes    Attends Banker Meetings: More than 4 times per year    Marital Status: Married   Past Surgical History:  Procedure Laterality Date   CARPAL TUNNEL RELEASE Bilateral 1996/1997   CYSTOSCOPY N/A 10/08/2021   Procedure: CYSTOSCOPY;  Surgeon: Viktoria Comer SAUNDERS, MD;  Location: [facility];  Service: Gynecology;  Laterality: N/A;   ROBOTIC ASSISTED TOTAL HYSTERECTOMY WITH BILATERAL SALPINGO OOPHERECTOMY N/A 10/08/2021   Procedure: XI ROBOTIC ASSISTED TOTAL HYSTERECTOMY WITH BILATERAL SALPINGO OOPHORECTOMY;SENTINEL LYMPH NODE INJECTION;  Surgeon: Viktoria Comer SAUNDERS, MD;  Location: [facility];  Service: Gynecology;  Laterality: N/A;   SQUAMOUS CELL CARCINOMA EXCISION Left 2017   Left jawline   TUBAL LIGATION  1980's   Past Surgical History:  Procedure Laterality Date   CARPAL TUNNEL RELEASE Bilateral 1996/1997   CYSTOSCOPY N/A 10/08/2021   Procedure: CYSTOSCOPY;  Surgeon: Viktoria Comer SAUNDERS, MD;  Location: [facility];  Service: Gynecology;  Laterality: N/A;   ROBOTIC ASSISTED TOTAL HYSTERECTOMY WITH BILATERAL SALPINGO OOPHERECTOMY N/A 10/08/2021   Procedure: XI ROBOTIC ASSISTED TOTAL HYSTERECTOMY WITH BILATERAL SALPINGO OOPHORECTOMY;SENTINEL LYMPH NODE INJECTION;  Surgeon: Viktoria Comer SAUNDERS, MD;  Location: [facility];  Service: Gynecology;  Laterality: N/A;   SQUAMOUS CELL CARCINOMA EXCISION Left 2017   Left jawline   TUBAL LIGATION  1980's   Past Medical History:  Diagnosis Date   Anxiety    Basal cell carcinoma    Depression    Hypertension    Neuromuscular disorder (HCC)    Osteoarthritis    Osteopenia    Parkinson disease (HCC)    Pneumonia    Tremors of nervous system    legs   uterine ca 10/08/2021   also endometrial cancer   BP 128/81   Pulse 91   Ht 5' 1 (1.549 m)   SpO2 99%   BMI 30.65 kg/m   Opioid Risk Score:   Fall Risk Score:  `1  Depression screen [facility] 2/9     06/15/2024   11:23 AM 05/12/2024   10:30 AM 01/08/2024     1:55 PM 01/08/2024    1:27 PM 12/28/2023    1:17 PM 12/08/2023    2:41 PM 11/25/2023    9:09 AM  Depression screen PHQ 2/9  Decreased Interest 0 0 0 0 1 0 0  Down, Depressed, Hopeless 0 0 0 0 1 0 0  PHQ - 2 Score 0 0 0 0 2 0 0  Altered sleeping     1  Tired, decreased energy     1    Change in appetite     0    Feeling bad or failure about yourself      0    Trouble concentrating     0    Moving slowly or fidgety/restless     0    Suicidal thoughts     0    PHQ-9 Score     4    Difficult doing work/chores     Somewhat difficult      Review of Systems  Musculoskeletal:  Positive for gait problem.       Pain all over the body.  All other systems reviewed and are negative.      Objective:   Physical Exam General: No acute distress HEENT: NCAT, EOMI, oral membranes moist Cards: reg rate  Chest: normal effort Abdomen: Soft, NT, ND Skin: dry, intact Extremities: no edema Psych: pleasant and appropriate  Skin: Clean and intact without signs of breakdown Neuro:  Alert and oriented x 3. Normal insight and awareness. Intact Memory. Normal language and speech. Cranial nerve exam unremarkable. MMT: 4/5 UE and LE's. Pill rolling tremor R>L UE, masked facies.  Slow to initiate with festinating gait.. Sensory exam normal for light touch and pain in all 4 limbs. No limb ataxia or cerebellar signs. No abnormal tone appreciated.  .   Musculoskeletal: Ongoing low back tenderness as well as tenderness in the coccyx and left coccygeal-sacral area. Lumbar flexion/extension/rotation did not increase pain. .            Assessment & Plan:  Chronic low back pain Coccydynia, left sided. She's had a lot done to work this up over the years. We'll see what else we can add to assess and treat.  Parkinson's disease   Plan: Continue with home exercise program as possible Begin trial of Butrans patch 5 mcg/h/week.  I gave her a link for application instructions that she can look at online.  Discussed  that she should not drive for be going out of the house for the first 48 hours after she tries the patch just to make sure she tolerates it. ? Shortwave diathermy as an option Never received records from [facility] but it sounds as if she has had medial branch blocks which were unsuccessful   20 minutes of face to face patient care time were spent during this visit. All questions were encouraged and answered. Follow up with me in 2 mos.

## 2024-06-20 DIAGNOSIS — I788 Other diseases of capillaries: Secondary | ICD-10-CM | POA: Diagnosis not present

## 2024-06-20 DIAGNOSIS — L570 Actinic keratosis: Secondary | ICD-10-CM | POA: Diagnosis not present

## 2024-06-20 DIAGNOSIS — Z85828 Personal history of other malignant neoplasm of skin: Secondary | ICD-10-CM | POA: Diagnosis not present

## 2024-06-27 ENCOUNTER — Encounter: Payer: Self-pay | Admitting: Physical Medicine & Rehabilitation

## 2024-06-28 ENCOUNTER — Other Ambulatory Visit: Payer: Self-pay

## 2024-06-28 DIAGNOSIS — M533 Sacrococcygeal disorders, not elsewhere classified: Secondary | ICD-10-CM

## 2024-06-28 DIAGNOSIS — G20A1 Parkinson's disease without dyskinesia, without mention of fluctuations: Secondary | ICD-10-CM

## 2024-06-28 NOTE — Patient Instructions (Addendum)
 Visit Information  Thank you for taking time to visit with me today. Please don't hesitate to contact me if I can be of assistance to you before our next scheduled appointment.  Your next care management appointment is by telephone on Thursday, December 11 at 2:30 PM  Please call the care guide team at [phone] if you need to cancel, schedule, or reschedule an appointment.   Please call the Suicide and Crisis Lifeline: 988 if you are experiencing a Mental Health or Behavioral Health Crisis or need someone to talk to.  Clayborne Ly RN BSN CCM [place]  [facility], [facility] Nurse Care Coordinator  Direct Dial: [phone] Website: [email or web address]

## 2024-06-28 NOTE — Patient Outreach (Addendum)
 Complex Care Management   Visit Note  06/28/2024  Name:  Ashley Neal MRN: [id number] DOB: Jul 25, 1946  Situation: Referral received for Complex Care Management related to Parkinson's disease; lumbar spondylosis; coccydynia; Orthostatic Hypotension, Falls.  I obtained verbal consent from Patient.  Visit completed with Patient on the phone.  Background:   Past Medical History:  Diagnosis Date   Anxiety    Basal cell carcinoma    Depression    Hypertension    Neuromuscular disorder (HCC)    Osteoarthritis    Osteopenia    Parkinson disease (HCC)    Pneumonia    Tremors of nervous system    legs   uterine ca 10/08/2021   also endometrial cancer    Assessment: Patient Reported Symptoms:  Cognitive Cognitive Status: Alert and oriented to person, place, and time, Normal speech and language skills Cognitive/Intellectual Conditions Management [RPT]: None reported or documented in medical history or problem list   Health Maintenance Behaviors: Annual physical exam Health Facilitated by: Pain control, Rest  Neurological Neurological Review of Symptoms: Other: Oher Neurological Symptoms/Conditions [RPT]: tremors, dyskensia, hallucitory nightmares, psuedobulbar affect Neurological Management Strategies: Medication therapy, Routine screening, Adequate rest Neurological Self-Management Outcome: 2 (bad)  HEENT HEENT Symptoms Reported: Not assessed      Cardiovascular Cardiovascular Symptoms Reported: Not assessed    Respiratory Respiratory Symptoms Reported: Not assesed    Endocrine Endocrine Symptoms Reported: No symptoms reported Is patient diabetic?: No    Gastrointestinal Gastrointestinal Symptoms Reported: Not assessed      Genitourinary Genitourinary Symptoms Reported: Not assessed    Integumentary Integumentary Symptoms Reported: Not assessed    Musculoskeletal Musculoskeletal Symptoms Reviewed: Difficulty walking, Limited mobility, Unsteady gait, Back pain, Muscle  pain, Weakness Additional Musculoskeletal Details: Impaired Physical Mobility related to Parkinson's disease Musculoskeletal Management Strategies: Adequate rest, Medical device, Routine screening Musculoskeletal Self-Management Outcome: 2 (bad) Falls in the past year?: Yes Number of falls in past year: 2 or more Was there an injury with Fall?: No Fall Risk Category Calculator: 2 Patient Fall Risk Level: Moderate Fall Risk Patient at Risk for Falls Due to: Impaired balance/gait, Impaired mobility Fall risk Follow up: Falls evaluation completed, Education provided  Psychosocial Psychosocial Symptoms Reported: Depression - if selected complete PHQ 2-9     Quality of Family Relationships: involved, supportive Do you feel physically threatened by others?: No    06/28/2024    PHQ2-9 Depression Screening   Ashley Neal interest or pleasure in doing things Nearly every day  Feeling down, depressed, or hopeless Nearly every day  PHQ-2 - Total Score 6  Trouble falling or staying asleep, or sleeping too much    Feeling tired or having Ashley Neal energy    Poor appetite or overeating     Feeling bad about yourself - or that you are a failure or have let yourself or your family down    Trouble concentrating on things, such as reading the newspaper or watching television    Moving or speaking so slowly that other people could have noticed.  Or the opposite - being so fidgety or restless that you have been moving around a lot more than usual    Thoughts that you would be better off dead, or hurting yourself in some way    PHQ2-9 Total Score    If you checked off any problems, how difficult have these problems made it for you to do your work, take care of things at home, or get along with other people  Depression Interventions/Treatment Medication, Currently on Treatment (referral sent to Jasmine Lewis LCSW for counseling and support)    There were no vitals filed for this visit. Pain Scale: 0-10 Pain  Score: 10-Worst pain ever Pain Type: Chronic pain Pain Location: Coccyx Pain Orientation: Right, Left Pain Descriptors / Indicators: Burning, Stabbing, Aching Pain Onset: Progressive Pain Intervention(s): Medication (See eMAR), Rest, Repositioned, Hot/Cold interventions Multiple Pain Sites: No  Medications Reviewed Today     Reviewed by Morgan Clayborne CROME, RN (Registered Nurse) on 06/28/24 at 1455  Med List Status: <None>   Medication Order Taking? Sig Documenting Provider Last Dose Status Informant  ALPRAZolam  (XANAX ) 0.25 MG tablet [id number]  Oral [provider]  Active   buprenorphine (BUTRANS) 5 MCG/HR PTWK [id number]  Place 1 patch onto the skin once a week.  Patient not taking: Reported on 06/28/2024   [name], MD  Active   carbidopa -levodopa  (SINEMET  IR) 25-100 MG tablet [id number]  Take 1 tablet by mouth 5 (five) times daily. [provider]  Active   escitalopram (LEXAPRO) 20 MG tablet [id number]  Take 20 mg by mouth daily. [provider]  Active   Multiple Vitamin (MULTI-VITAMIN) tablet [id number]  Take 1 tablet by mouth daily. [provider]  Active             Recommendation:   Specialty provider follow-up   08/24/2024 Status: Sch   Time: 1:40 PM Length: 20  Visit Type: FOLLOW UP [108] Copay: $0.00  Provider: [name], MD Department: [facility]  PCP follow-up  09/02/2024 Status: Sch   Time: 11:20 AM Length: 20  Visit Type: OFFICE VISIT [8002] Copay: $0.00  Provider: Caro Harlene POUR, NP Department: [facility]   Follow Up Plan:   Telephone follow up appointment date/time:    07/21/2024 Status: Sch   Time: 2:30 PM Length: 30  Visit Type: VBCI TELEPHONE CALL 30 [2502] Copay: $0.00  Provider: Morgan Clayborne CROME, RN Department: [facility]   07/26/2024 Status: Sch   Time: 3:00 PM Length: 60  Visit Type: VBCI TELEPHONE CALL 60 [2503] Copay: $0.00  Provider: Ezzard Rolin BIRCH, LCSW  Department: [facility]   Clayborne Morgan RN BSN CCM   [facility], [facility] Nurse Care Coordinator  Direct Dial: [phone] Website: [email or web address]

## 2024-07-01 DIAGNOSIS — G20A1 Parkinson's disease without dyskinesia, without mention of fluctuations: Secondary | ICD-10-CM | POA: Diagnosis not present

## 2024-07-01 DIAGNOSIS — M5440 Lumbago with sciatica, unspecified side: Secondary | ICD-10-CM | POA: Diagnosis not present

## 2024-07-21 ENCOUNTER — Telehealth: Payer: Self-pay

## 2024-07-21 NOTE — Patient Instructions (Signed)
 Ashley Neal - I am sorry I was unable to reach you today for our scheduled appointment.   Your next care management appointment is by telephone on Friday, January 9 at 3:00 PM  Please call the care guide team at [phone] if you need to cancel, schedule, or reschedule an appointment.   Please call 1-800-273-TALK (toll free, 24 hour hotline) if you are experiencing a Mental Health or Behavioral Health Crisis or need someone to talk to.  Thank you,   Clayborne Ly RN BSN CCM [place]  [facility], [facility] Nurse Care Coordinator  Direct Dial: [phone] Website: [email or web address]

## 2024-07-26 ENCOUNTER — Telehealth: Admitting: Licensed Clinical Social Worker

## 2024-07-29 ENCOUNTER — Other Ambulatory Visit: Payer: Self-pay | Admitting: Licensed Clinical Social Worker

## 2024-07-29 NOTE — Patient Instructions (Signed)
 Visit Information  Thank you for taking time to visit with me today. Please don't hesitate to contact me if I can be of assistance to you before our next scheduled appointment.  Our next appointment is by telephone on 01/30 at 2 PM Please call the care guide team at [phone] if you need to cancel or reschedule your appointment.   Following is a copy of your care plan:   Goals Addressed             This Visit's Progress    LCSW VBCI Social Work Care Plan   On track    Problems:   Stress  CSW Clinical Goal(s):   Over the next 90 days the Patient will attend all scheduled medical appointments as evidenced by patient report and care team review of appointment completion in electronic MEDICAL RECORD NUMBER  demonstrate a reduction in symptoms related to Stress .  Interventions:  Mental Health:  Evaluation of current treatment plan related to Stress Active listening / Reflection utilized Consideration on in-home help encouraged : options discussed Discussed caregiver resources and support: Pt agreed to have LCSW mail and email Personal Care Services information to contact info on file Emotional Support Provided Mindfulness or Relaxation training provided Motivational Interviewing employed Problem Solving /Task Center strategies reviewed Provided general psycho-education for mental health needs Reviewed mental health medications and discussed importance of compliance: Pt reports compliance with Lexapro Solution-Focued Strategies employed:   Patient Goals/Self-Care Activities:  Continue taking your medication as prescribed.   Increase coping skills, self-management skills, and stress reduction  Attend upcoming medical appts discussed  Review supportive info regarding PCS services  Pt agreed to continue practicing healthy coping skills (attending senior center, participate in support groups online, and spend time with family and friends) for the next 30 days to promote positive  mood and socialization  Plan:   Telephone follow up appointment with care management team member scheduled for:  1 month        Please call the Suicide and Crisis Lifeline: 988 go to [facility] [address], [place] [phone]) call 911 if you are experiencing a Mental Health or Behavioral Health Crisis or need someone to talk to.  Patient verbalized understanding of Care plan and visit instructions communicated this visit  Rolin Kerns, LCSW [facility]  [facility], [facility] Clinical Social Worker Direct Dial: [phone]  Fax: [phone] Website: [email or web address] 12:58 PM

## 2024-07-29 NOTE — Patient Outreach (Signed)
 Complex Care Management   Visit Note  07/29/2024  Name:  Ashley Neal MRN: [id number] DOB: [birth year]  Situation: Referral received for Complex Care Management related to Stress I obtained verbal consent from Patient.  Visit completed with Patient  on the phone  Background:   Past Medical History:  Diagnosis Date   Anxiety    Basal cell carcinoma    Depression    Hypertension    Neuromuscular disorder (HCC)    Osteoarthritis    Osteopenia    Parkinson disease (HCC)    Pneumonia    Tremors of nervous system    legs   uterine ca 10/08/2021   also endometrial cancer    Assessment: Patient Reported Symptoms:  Cognitive Cognitive Status: No symptoms reported, Alert and oriented to person, place, and time, Normal speech and language skills Cognitive/Intellectual Conditions Management [RPT]: None reported or documented in medical history or problem list   Health Maintenance Behaviors: Annual physical exam  Neurological Neurological Review of Symptoms: Other: Oher Neurological Symptoms/Conditions [RPT]: tremors, dyskensia Neurological Management Strategies: Coping strategies, Medication therapy, Routine screening  HEENT HEENT Symptoms Reported: Not assessed      Cardiovascular Cardiovascular Symptoms Reported: Not assessed    Respiratory Respiratory Symptoms Reported: Not assesed    Endocrine Endocrine Symptoms Reported: No symptoms reported Is patient diabetic?: No    Gastrointestinal Gastrointestinal Symptoms Reported: No symptoms reported      Genitourinary Genitourinary Symptoms Reported: Not assessed    Integumentary Integumentary Symptoms Reported: Not assessed    Musculoskeletal Musculoskelatal Symptoms Reviewed: Back pain, Difficulty walking, Limited mobility, Muscle pain, Unsteady gait, Weakness Additional Musculoskeletal Details: Impaired Physical Mobility related to Parkinson's disease Musculoskeletal Management Strategies: Coping strategies, Medication  therapy, Routine screening   Patient at Risk for Falls Due to: Impaired mobility  Psychosocial Psychosocial Symptoms Reported: Other Other Psychosocial Conditions: Stress Behavioral Management Strategies: Adequate rest, Coping strategies, Medication therapy, Support system, Activity Major Change/Loss/Stressor/Fears (CP): Medical condition, self Techniques to Cope with Loss/Stress/Change: Diversional activities, Medication Quality of Family Relationships: helpful, involved, supportive Do you feel physically threatened by others?: No    07/29/2024    PHQ2-9 Depression Screening   Little interest or pleasure in doing things    Feeling down, depressed, or hopeless    PHQ-2 - Total Score    Trouble falling or staying asleep, or sleeping too much    Feeling tired or having little energy    Poor appetite or overeating     Feeling bad about yourself - or that you are a failure or have let yourself or your family down    Trouble concentrating on things, such as reading the newspaper or watching television    Moving or speaking so slowly that other people could have noticed.  Or the opposite - being so fidgety or restless that you have been moving around a lot more than usual    Thoughts that you would be better off dead, or hurting yourself in some way    PHQ2-9 Total Score    If you checked off any problems, how difficult have these problems made it for you to do your work, take care of things at home, or get along with other people    Depression Interventions/Treatment      There were no vitals filed for this visit.    Medications Reviewed Today     Reviewed by Ezzard Rolin BIRCH, LCSW (Social Worker) on 07/29/24 at [phone]  Med List Status: <None>   Medication Order  Taking? Sig Documenting Provider Last Dose Status Informant  ALPRAZolam  (XANAX ) 0.25 MG tablet [id number]  Oral [provider]  Active   buprenorphine  (BUTRANS ) 5 MCG/HR PTWK [id number]  Place 1 patch onto the skin once  a week.  Patient not taking: Reported on 06/28/2024   [name], MD  Active   carbidopa -levodopa  (SINEMET  IR) 25-100 MG tablet [id number]  Take 1 tablet by mouth 5 (five) times daily. [provider]  Active   escitalopram (LEXAPRO) 20 MG tablet [id number]  Take 20 mg by mouth daily. [provider]  Active   Multiple Vitamin (MULTI-VITAMIN) tablet [id number]  Take 1 tablet by mouth daily. [provider]  Active             Recommendation:   Continue Current Plan of Care  Follow Up Plan:   Telephone follow-up in 1 month  Rolin Kerns, LCSW [facility]  [facility], [facility] Clinical Social Worker Direct Dial: [phone]  Fax: [phone] Website: [email or web address] 12:57 PM

## 2024-08-19 ENCOUNTER — Other Ambulatory Visit: Payer: Self-pay

## 2024-08-19 NOTE — Patient Instructions (Signed)
 Visit Information  Thank you for taking time to visit with me today. Please don't hesitate to contact me if I can be of assistance to you before our next scheduled appointment.  Your next care management appointment is by telephone on Tuesday, January 27 at 3:00 PM  Please call the care guide team at [phone] if you need to cancel, schedule, or reschedule an appointment.   Please call 1-800-273-TALK (toll free, 24 hour hotline) if you are experiencing a Mental Health or Behavioral Health Crisis or need someone to talk to.  Clayborne Ly RN BSN CCM [place]  [facility], [facility] Nurse Care Coordinator  Direct Dial: [phone] Website: [email or web address]

## 2024-08-19 NOTE — Patient Outreach (Signed)
 Complex Care Management   Visit Note  08/19/2024  Name:  Ashley Neal MRN: [id number] DOB: May 24, 1946  Situation: Referral received for Complex Care Management related to Parkinson's disease; lumbar spondylosis; coccydynia; Orthostatic Hypotension, Falls. I obtained verbal consent from Patient.  Visit completed with Patient on the phone.  Background:   Past Medical History:  Diagnosis Date   Anxiety    Basal cell carcinoma    Depression    Hypertension    Neuromuscular disorder (HCC)    Osteoarthritis    Osteopenia    Parkinson disease (HCC)    Pneumonia    Tremors of nervous system    legs   uterine ca 10/08/2021   also endometrial cancer    Assessment: Patient Reported Symptoms:  Cognitive Cognitive Status: Alert and oriented to person, place, and time, Normal speech and language skills Cognitive/Intellectual Conditions Management [RPT]: None reported or documented in medical history or problem list   Health Maintenance Behaviors: Annual physical exam, Stress management, Exercise Health Facilitated by: Rest, Pain control, Stress management  Neurological Neurological Review of Symptoms: Other: Oher Neurological Symptoms/Conditions [RPT]: tremors, dyskensia Neurological Management Strategies: Coping strategies, Medication therapy, Routine screening Neurological Self-Management Outcome: 4 (good)  HEENT HEENT Symptoms Reported: No symptoms reported      Cardiovascular Cardiovascular Symptoms Reported: No symptoms reported    Respiratory Respiratory Symptoms Reported: No symptoms reported    Endocrine Endocrine Symptoms Reported: No symptoms reported Is patient diabetic?: No    Gastrointestinal Gastrointestinal Symptoms Reported: No symptoms reported      Genitourinary Genitourinary Symptoms Reported: No symptoms reported    Integumentary Integumentary Symptoms Reported: No symptoms reported    Musculoskeletal Musculoskelatal Symptoms Reviewed: Back pain,  Difficulty walking, Limited mobility, Muscle pain, Unsteady gait, Weakness Other Musculoskeletal Symptoms: chronic lower back pain w/spams Musculoskeletal Management Strategies: Medication therapy, Exercise, Adequate rest, Routine screening, Coping strategies Musculoskeletal Self-Management Outcome: 4 (good)      Psychosocial Psychosocial Symptoms Reported: Alteration in sleep habits, Nightmares, Other Other Psychosocial Conditions: visual hallucinations Behavioral Management Strategies: Adequate rest, Coping strategies, Medication therapy, Support system, Activity Major Change/Loss/Stressor/Fears (CP): Medical condition, self Techniques to Cope with Loss/Stress/Change: Diversional activities, Exercise, Medication, Support group Quality of Family Relationships: supportive Do you feel physically threatened by others?: No    08/19/2024    PHQ2-9 Depression Screening   Ashley Neal interest or pleasure in doing things    Feeling down, depressed, or hopeless    PHQ-2 - Total Score    Trouble falling or staying asleep, or sleeping too much    Feeling tired or having Ashley Neal energy    Poor appetite or overeating     Feeling bad about yourself - or that you are a failure or have let yourself or your family down    Trouble concentrating on things, such as reading the newspaper or watching television    Moving or speaking so slowly that other people could have noticed.  Or the opposite - being so fidgety or restless that you have been moving around a lot more than usual    Thoughts that you would be better off dead, or hurting yourself in some way    PHQ2-9 Total Score    If you checked off any problems, how difficult have these problems made it for you to do your work, take care of things at home, or get along with other people    Depression Interventions/Treatment      There were no vitals filed for this visit. Pain  Scale: 0-10 Pain Type: Chronic pain Pain Location: Coccyx Pain Orientation: Left,  Right Pain Descriptors / Indicators: Stabbing, Burning, Aching Pain Onset: Progressive Pain Intervention(s): Repositioned, Medication (See eMAR), Rest  Medications Reviewed Today     Reviewed by Neal Ashley CROME, RN (Registered Nurse) on 08/19/24 at 1505  Med List Status: <None>   Medication Order Taking? Sig Documenting Provider Last Dose Status Informant  ALPRAZolam  (XANAX ) 0.25 MG tablet [id number]  Oral [provider]  Active   buprenorphine  (BUTRANS ) 5 MCG/HR PTWK [id number]  Place 1 patch onto the skin once a week.  Patient not taking: Reported on 06/28/2024   Ashley Arthea DASEN, MD  Active   carbidopa -levodopa  (SINEMET  IR) 25-100 MG tablet [id number]  Take 1 tablet by mouth 5 (five) times daily. [provider]  Active   escitalopram (LEXAPRO) 20 MG tablet [id number]  Take 20 mg by mouth daily. [provider]  Active   Multiple Vitamin (MULTI-VITAMIN) tablet [id number]  Take 1 tablet by mouth daily. [provider]  Active             Recommendation:   PCP Follow-up  Specialty provider follow-up   08/24/2024 Status: Sch   Time: 1:40 PM Length: 20  Visit Type: FOLLOW UP [108] Copay: $0.00  Provider: [name], MD Department: [facility]    09/02/2024 Status: Sch   Time: 11:20 AM Length: 20  Visit Type: OFFICE VISIT [8002] Copay: $0.00  Provider: Caro Harlene POUR, NP Department: [facility]   Follow Up Plan:   Telephone follow up appointment date/time   08/19/2024 Status: Comp   Time: 3:00 PM Length: 30  Visit Type: VBCI TELEPHONE CALL 30 [2502] Copay: $0.00  Provider: Morgan Ashley CROME, RN Department: [facility]   Ashley Morgan RN BSN CCM [place]  [facility], [facility] Nurse Care Coordinator  Direct Dial: [phone] Website: [email or web address]

## 2024-08-24 ENCOUNTER — Encounter: Attending: Physical Medicine & Rehabilitation | Admitting: Physical Medicine & Rehabilitation

## 2024-08-24 ENCOUNTER — Encounter: Payer: Self-pay | Admitting: Physical Medicine & Rehabilitation

## 2024-08-24 VITALS — BP 144/86 | HR 87 | Ht 61.0 in | Wt 162.0 lb

## 2024-08-24 DIAGNOSIS — M47816 Spondylosis without myelopathy or radiculopathy, lumbar region: Secondary | ICD-10-CM | POA: Diagnosis not present

## 2024-08-24 DIAGNOSIS — G20A1 Parkinson's disease without dyskinesia, without mention of fluctuations: Secondary | ICD-10-CM | POA: Diagnosis not present

## 2024-08-24 NOTE — Progress Notes (Signed)
 "  Subjective:    Patient ID: Ashley Neal, female    DOB: [birth year], 79 y.o.   MRN: [id number]  HPI  Discussed the use of AI scribe software for clinical note transcription with the patient, who gave verbal consent to proceed.  History of Present Illness Ashley Neal is a 79 year old female with chronic low back pain, coccydynia, and Parkinson's disease who presents for management of persistent pain.  Chronic Low Back and Coccygeal Pain: - Persistent low back pain radiating to the buttocks and waist - Episodes of severe, 'breathtaking' contraction pain causing her to double over and nearly lose consciousness - Coccygeal pain transiently relieved by black tea for up to three hours - Utilizes heat therapy throughout the day - Maintains a home exercise program with stretching and physical therapy exercises - Last attended physical therapy two to three months ago; plans to resume neuro physical therapy for Parkinson's-related gait dysfunction  Analgesic and Adjunctive Therapy Response: - Multiple analgesics trialed: oxycodone , hydrocodone , fentanyl  - Most recent trial with Butrans  patch provided mild initial improvement but discontinued due to gastrointestinal side effects, severe headache, xerostomia, and cognitive impairment - Adverse effects resolved within two days of discontinuation - CBD gummies trialed without benefit - Caffeine (400 mg/day) disrupted sleep - Has considered but not initiated ivermectin as recommended by a homeopath  Functional Status and Lifestyle Modifications: - Remains physically and cognitively active with yoga, word puzzles, and weekly trivia - Considering [employer] exercise class for stretching, strengthening, and balance - Concerned about future mobility and long-term care needs - Avoids white sugar and other inflammatory foods, despite craving sugar as a dopamine substitute    Pain Inventory Average Pain 9 Pain Right Now 6 My pain  is intermittent, constant, sharp, and burning  In the last 24 hours, has pain interfered with the following? General activity 9 Relation with others 7 Enjoyment of life 5 What TIME of day is your pain at its worst? varies Sleep (in general) Fair  Pain is worse with: walking, bending, standing, and some activites Pain improves with: rest and heat/ice Relief from Meds: 5  Family History  Problem Relation Age of Onset   Endometrial cancer Mother    Diabetes Mother    Alzheimer's disease Father    Coronary artery disease Father    Diabetes Sister    COPD Sister    Diabetes Sister    Heart disease Brother    Other Brother        MVA    Parkinson's disease Brother    Heart disease Brother    Alcohol abuse Brother    Heart disease Brother    Parkinson's disease Maternal Aunt    Breast cancer Maternal Aunt    Colon cancer Paternal Aunt    Parkinson's disease Paternal Aunt    Lung cancer Paternal Uncle    Healthy Son    Healthy Son    Prostate cancer Neg Hx    Pancreatic cancer Neg Hx    Ovarian cancer Neg Hx    Social History   Socioeconomic History   Marital status: Married    Spouse name: Gwenn Patella   Number of children: 2   Years of education: 16   Highest education level: Bachelor's degree (e.g., BA, AB, BS)  Occupational History   Occupation: retired    Comment: advertising account planner  Tobacco Use   Smoking status: Never    Passive exposure: Never   Smokeless tobacco: Never  Vaping  Use   Vaping status: Never Used  Substance and Sexual Activity   Alcohol use: Not Currently    Comment: rarely   Drug use: Not Currently   Sexual activity: Not Currently    Partners: Male  Other Topics Concern   Not on file  Social History Narrative   Not on file   Social Drivers of Health   Tobacco Use: Low Risk (08/24/2024)   Patient History    Smoking Tobacco Use: Never    Smokeless Tobacco Use: Never    Passive Exposure: Never  Financial Resource Strain: Low Risk  (05/01/2024)   Overall Financial Resource Strain (CARDIA)    Difficulty of Paying Living Expenses: Not very hard  Food Insecurity: No Food Insecurity (05/01/2024)   Epic    Worried About Radiation Protection Practitioner of Food in the Last Year: Never true    Ran Out of Food in the Last Year: Never true  Transportation Needs: No Transportation Needs (05/01/2024)   Epic    Lack of Transportation (Medical): No    Lack of Transportation (Non-Medical): No  Physical Activity: Sufficiently Active (05/01/2024)   Exercise Vital Sign    Days of Exercise per Week: 6 days    Minutes of Exercise per Session: 40 min  Stress: No Stress Concern Present (05/01/2024)   [employer] of Occupational Health - Occupational Stress Questionnaire    Feeling of Stress: Only a little  Social Connections: Socially Integrated (05/01/2024)   Social Connection and Isolation Panel    Frequency of Communication with Friends and Family: More than three times a week    Frequency of Social Gatherings with Friends and Family: Twice a week    Attends Religious Services: More than 4 times per year    Active Member of Clubs or Organizations: Yes    Attends Banker Meetings: More than 4 times per year    Marital Status: Married  Depression (PHQ2-9): Medium Risk (06/28/2024)   Depression (PHQ2-9)    PHQ-2 Score: 6  Alcohol Screen: Low Risk (01/08/2024)   Alcohol Screen    Last Alcohol Screening Score (AUDIT): 1  Housing: Low Risk (05/01/2024)   Epic    Unable to Pay for Housing in the Last Year: No    Number of Times Moved in the Last Year: 0    Homeless in the Last Year: No  Utilities: Not At Risk (03/29/2024)   Epic    Threatened with loss of utilities: No  Health Literacy: Adequate Health Literacy (01/08/2024)   B1300 Health Literacy    Frequency of need for help with medical instructions: Never   Past Surgical History:  Procedure Laterality Date   CARPAL TUNNEL RELEASE Bilateral 1996/1997   CYSTOSCOPY N/A 10/08/2021    Procedure: CYSTOSCOPY;  Surgeon: Viktoria Comer SAUNDERS, MD;  Location: [facility];  Service: Gynecology;  Laterality: N/A;   ROBOTIC ASSISTED TOTAL HYSTERECTOMY WITH BILATERAL SALPINGO OOPHERECTOMY N/A 10/08/2021   Procedure: XI ROBOTIC ASSISTED TOTAL HYSTERECTOMY WITH BILATERAL SALPINGO OOPHORECTOMY;SENTINEL LYMPH NODE INJECTION;  Surgeon: Viktoria Comer SAUNDERS, MD;  Location: [facility];  Service: Gynecology;  Laterality: N/A;   SQUAMOUS CELL CARCINOMA EXCISION Left 2017   Left jawline   TUBAL LIGATION  1980's   Past Surgical History:  Procedure Laterality Date   CARPAL TUNNEL RELEASE Bilateral 1996/1997   CYSTOSCOPY N/A 10/08/2021   Procedure: CYSTOSCOPY;  Surgeon: Viktoria Comer SAUNDERS, MD;  Location: [facility];  Service: Gynecology;  Laterality: N/A;   ROBOTIC ASSISTED TOTAL HYSTERECTOMY WITH BILATERAL SALPINGO  OOPHERECTOMY N/A 10/08/2021   Procedure: XI ROBOTIC ASSISTED TOTAL HYSTERECTOMY WITH BILATERAL SALPINGO OOPHORECTOMY;SENTINEL LYMPH NODE INJECTION;  Surgeon: Viktoria Comer SAUNDERS, MD;  Location: [facility];  Service: Gynecology;  Laterality: N/A;   SQUAMOUS CELL CARCINOMA EXCISION Left 2017   Left jawline   TUBAL LIGATION  1980's   Past Medical History:  Diagnosis Date   Anxiety    Basal cell carcinoma    Depression    Hypertension    Neuromuscular disorder (HCC)    Osteoarthritis    Osteopenia    Parkinson disease (HCC)    Pneumonia    Tremors of nervous system    legs   uterine ca 10/08/2021   also endometrial cancer   BP (!) 144/86   Pulse 87   Ht 5' 1 (1.549 m)   Wt 162 lb (73.5 kg) Comment: last recorded  SpO2 96%   BMI 30.61 kg/m   Opioid Risk Score:   Fall Risk Score:  `1  Depression screen [facility] 2/9     06/28/2024    2:54 PM 06/15/2024   11:23 AM 05/12/2024   10:30 AM 01/08/2024    1:55 PM 01/08/2024    1:27 PM 12/28/2023    1:17 PM 12/08/2023    2:41 PM  Depression screen PHQ 2/9  Decreased Interest 3 0 0 0 0 1 0  Down, Depressed, Hopeless 3 0 0 0 0 1 0  PHQ - 2 Score  6 0 0 0 0 2 0  Altered sleeping      1   Tired, decreased energy      1   Change in appetite      0   Feeling bad or failure about yourself       0   Trouble concentrating      0   Moving slowly or fidgety/restless      0   Suicidal thoughts      0   PHQ-9 Score      4    Difficult doing work/chores      Somewhat difficult      Data saved with a previous flowsheet row definition     Review of Systems  Musculoskeletal:  Positive for gait problem.       Spasms  All other systems reviewed and are negative.      Objective:   Physical Exam  General: No acute distress HEENT: NCAT, EOMI, oral membranes moist Cards: reg rate  Chest: normal effort Abdomen: Soft, NT, ND Skin: dry, intact Extremities: no edema Psych: pleasant and appropriate  Skin: Clean and intact without signs of breakdown Neuro:  Alert and oriented x 3. Normal insight and awareness. Intact Memory. Normal language and speech. Cranial nerve exam unremarkable. MMT: 4/5 UE and LE's. Pill rolling tremor R>L UE, masked facies.  Slow to initiate with festinating gait.. Sensory exam normal for light touch and pain in all 4 limbs. No limb ataxia or cerebellar signs. No abnormal tone appreciated.  .   Musculoskeletal: Ongoing low back tenderness as well as tenderness in the coccyx and left coccygeal-sacral area. Pain seemed a bit more higher today, more so in the lower lumbar spine.  Lumbar flexion/extension/rotation did not increase pain. .            Assessment & Plan:  Chronic low back pain Coccydynia, left sided. She's had a lot done to work this up over the years. We'll see what else we can add to assess and treat.  Parkinson's disease   Plan: Continue with home exercise program as possible. Revisit PT. She will call about stretches she can do at home Discussed natural remedies, homeopathic approaches to pain. Provided list of foods ? Shortwave diathermy as an option--will reach out to Dr. Carilyn. Info was  provided to patient It appears as if she has had medial branch blocks which were unsuccessful   20 minutes of face to face patient care time were spent during this visit. All questions were encouraged and answered. Follow up with me in 4 mos, sooner with Dr. Carilyn .        "

## 2024-08-24 NOTE — Patient Instructions (Signed)
" °  VISIT SUMMARY: During your visit, we discussed the management of your chronic low back pain, coccydynia, and Parkinson's disease. We reviewed your current symptoms, treatments, and lifestyle modifications. We also explored additional non-pharmacologic treatment options and dietary changes to help manage your pain and inflammation.  YOUR PLAN: -CHRONIC LOW BACK PAIN: Chronic low back pain is persistent pain in the lower back that can affect daily activities. We discussed the inflammatory effects of certain foods and recommended dietary modifications to reduce inflammation, including limiting white sugar, refined grains, and processed foods. You were given an informational flyer on natural remedies and anti-inflammatory foods. We also discussed shortwave diathermy as a potential non-pharmacologic treatment and offered a referral to a trained provider. Continue using heat and stretching exercises as tolerated, and consider contacting your previous physical therapist for additional home exercise recommendations and follow-up if needed.  -COCCYDYNIA: Coccydynia is pain in the coccyx or tailbone area, often exacerbated by sitting or certain activities. We provided education on dietary and natural remedies for pain and inflammation. Continue stretching and using heat as tolerated. We also discussed shortwave diathermy as a potential treatment option.  -PARKINSON'S DISEASE: Parkinson's disease is a progressive neurological disorder that affects movement. Continue participating in neuro physical therapy for gait and balance. Maintain your engagement in yoga, word puzzles, and other cognitive activities to help preserve function. We also discussed the potential benefits of [employer] exercise classes for stretching, strengthening, and balance.  INSTRUCTIONS: Please follow up with your previous physical therapist for additional home exercise recommendations and any necessary follow-up. Consider the  referral for shortwave diathermy if you are interested in exploring this treatment option.     "

## 2024-09-02 ENCOUNTER — Ambulatory Visit: Payer: Self-pay | Admitting: Nurse Practitioner

## 2024-09-02 VITALS — BP 136/80 | HR 85 | Temp 98.1°F | Ht 61.0 in | Wt 159.4 lb

## 2024-09-02 DIAGNOSIS — Z8542 Personal history of malignant neoplasm of other parts of uterus: Secondary | ICD-10-CM

## 2024-09-02 DIAGNOSIS — G8929 Other chronic pain: Secondary | ICD-10-CM | POA: Diagnosis not present

## 2024-09-02 DIAGNOSIS — G20A1 Parkinson's disease without dyskinesia, without mention of fluctuations: Secondary | ICD-10-CM

## 2024-09-02 DIAGNOSIS — M5440 Lumbago with sciatica, unspecified side: Secondary | ICD-10-CM | POA: Diagnosis not present

## 2024-09-02 DIAGNOSIS — F3341 Major depressive disorder, recurrent, in partial remission: Secondary | ICD-10-CM | POA: Diagnosis not present

## 2024-09-02 DIAGNOSIS — I951 Orthostatic hypotension: Secondary | ICD-10-CM

## 2024-09-02 DIAGNOSIS — E782 Mixed hyperlipidemia: Secondary | ICD-10-CM

## 2024-09-02 DIAGNOSIS — R739 Hyperglycemia, unspecified: Secondary | ICD-10-CM | POA: Diagnosis not present

## 2024-09-02 DIAGNOSIS — F419 Anxiety disorder, unspecified: Secondary | ICD-10-CM

## 2024-09-02 NOTE — Progress Notes (Signed)
 "  Location[facility]  [facility]  Provider: Caro Harlene POUR, NP  Goals of Care:     05/12/2024   10:31 AM  Advanced Directives  Does Patient Have a Medical Advance Directive? Yes  Type of Estate Agent of [place];Living will;Out of facility DNR (pink MOST or yellow form)  Does patient want to make changes to medical advance directive? No - Patient declined  Copy of Healthcare Power of Attorney in Chart? No - copy requested     Chief Complaint  Patient presents with   Routine follow up    Patient has concerns about a lot veins in her legs causing weakness. Patient has concerns about right eye    HPI: Patient is a 79 y.o. female seen today for medical management of chronic diseases.   Parkinson's symptoms reviewed: pt states trouble taking off socks, trouble putting on bra, trouble holding hairbrush; slight progression in parkinson's symptoms,  overall she feels like she is well managed on current therapy. She is now followed by neurology in [place].   Routine labs discussed with patient; lipid panel and cardiovascular risk management in context discussed; pt declined lipid panel.  Pt denies incontinence; discussed plans for home equipment to help manage in future. Would like to do a purewick in the future if she needs.   Pt declined PT at this time, states there are too many appointments to keep. Pt states episodic hypotension.  Palliative care options discussed with pt; referral made.  She is followed by pain management but having a hard time finding medication that works for her that does not have significant side effects.   Past Medical History:  Diagnosis Date   Anxiety    Basal cell carcinoma    Depression    Hypertension    Neuromuscular disorder (HCC)    Osteoarthritis    Osteopenia    Parkinson disease (HCC)    Pneumonia    Tremors of nervous system    legs   uterine ca 10/08/2021   also endometrial cancer    Past Surgical History:   Procedure Laterality Date   CARPAL TUNNEL RELEASE Bilateral 1996/1997   CYSTOSCOPY N/A 10/08/2021   Procedure: CYSTOSCOPY;  Surgeon: Viktoria Comer SAUNDERS, MD;  Location: [facility];  Service: Gynecology;  Laterality: N/A;   ROBOTIC ASSISTED TOTAL HYSTERECTOMY WITH BILATERAL SALPINGO OOPHERECTOMY N/A 10/08/2021   Procedure: XI ROBOTIC ASSISTED TOTAL HYSTERECTOMY WITH BILATERAL SALPINGO OOPHORECTOMY;SENTINEL LYMPH NODE INJECTION;  Surgeon: Viktoria Comer SAUNDERS, MD;  Location: [facility];  Service: Gynecology;  Laterality: N/A;   SQUAMOUS CELL CARCINOMA EXCISION Left 2017   Left jawline   TUBAL LIGATION  1980's    Allergies[1]  Allergies as of 09/02/2024       Reactions   Gabapentin Other (See Comments)   Ringing in ears   Hydrocodone -acetaminophen     Dizzy, ringing in ears Ineffective for pain Dizzy, ringing in ears Ineffective for pain   Lyrica  [pregabalin ]    Dizziness and ringing in ears    Codeine Other (See Comments)   Nausea Other Reaction(s): Unknown        Medication List        Accurate as of September 02, 2024  3:09 PM. If you have any questions, ask your nurse or doctor.          STOP taking these medications    ALPRAZolam  0.25 MG tablet Commonly known as: XANAX  Stopped by: Harlene Caro, NP       TAKE these medications  carbidopa -levodopa  25-100 MG tablet Commonly known as: SINEMET  IR Take 1 tablet by mouth 5 (five) times daily. What changed:  how much to take additional instructions The timing of this medication is very important.   escitalopram 20 MG tablet Commonly known as: LEXAPRO Take 20 mg by mouth daily.   Multi-Vitamin tablet Take 1 tablet by mouth daily.        Review of Systems:  Review of Systems  Constitutional:  Negative for fatigue and unexpected weight change.  HENT: Negative.    Eyes: Negative.   Respiratory: Negative.    Cardiovascular: Negative.   Gastrointestinal: Negative.   Endocrine: Negative.   Genitourinary:  Negative.   Neurological:  Positive for tremors, weakness and numbness.  Psychiatric/Behavioral:  Negative for confusion, dysphoric mood, hallucinations, self-injury, sleep disturbance and suicidal ideas.    Health Maintenance  Topic Date Due   Influenza Vaccine  11/08/2024 (Originally 03/11/2024)   Pneumococcal Vaccine: 50+ Years (1 of 1 - PCV) 12/27/2024 (Originally 07/13/1996)   COVID-19 Vaccine (2 - Janssen risk series) 08/11/2026 (Originally 11/24/2019)   Medicare Annual Wellness (AWV)  05/12/2025   DTaP/Tdap/Td (2 - Td or Tdap) 07/11/2028   Hepatitis C Screening  Completed   Zoster Vaccines- Shingrix  Completed   Meningococcal B Vaccine  Aged Out   Mammogram  Discontinued   Bone Density Scan  Discontinued    Physical Exam: Vitals:   09/02/24 1128  BP: 136/80  Pulse: 85  Temp: 98.1 F (36.7 C)  SpO2: 97%  Weight: 159 lb 6.4 oz (72.3 kg)  Height: 5' 1 (1.549 m)   Body mass index is 30.12 kg/m. Physical Exam Constitutional:      Appearance: Normal appearance. She is normal weight.  HENT:     Head: Normocephalic.     Nose: Nose normal.  Cardiovascular:     Rate and Rhythm: Normal rate and regular rhythm.  Pulmonary:     Effort: Pulmonary effort is normal.     Breath sounds: Normal breath sounds.  Musculoskeletal:     Cervical back: Normal range of motion and neck supple.  Skin:    General: Skin is warm and dry.  Neurological:     Mental Status: She is alert and oriented to person, place, and time. Mental status is at baseline.     Motor: Weakness present.  Psychiatric:        Mood and Affect: Mood normal.     Labs reviewed: Basic Metabolic Panel: Recent Labs    12/28/23 1403  NA 143  K 4.0  CL 107  CO2 29  GLUCOSE 108  BUN 18  CREATININE 0.90  CALCIUM 9.9   Liver Function Tests: Recent Labs    12/28/23 1403  AST 12  ALT 4*  BILITOT 0.6  PROT 6.4   No results for input(s): LIPASE, AMYLASE in the last 8760 hours. No results for input(s):  AMMONIA in the last 8760 hours. CBC: Recent Labs    12/28/23 1403  WBC 6.2  NEUTROABS 4,390  HGB 13.8  HCT 40.8  MCV 93.4  PLT 240   Lipid Panel: No results for input(s): CHOL, HDL, LDLCALC, TRIG, CHOLHDL, LDLDIRECT in the last 8760 hours. Lab Results  Component Value Date   HGBA1C 5.3 12/28/2023    Procedures since last visit: No results found.  Assessment/Plan 1. Parkinson's disease, unspecified whether dyskinesia present, unspecified whether manifestations fluctuate (HCC) (Primary) - pt noted slight progression in parkinson's symptoms; pt education provided on disease progression, continue with  current therapy. - CBC with Differential/Platelet - Comprehensive metabolic panel with GFR - Amb Referral to Palliative Care  2. Chronic low back pain with sciatica, sciatica laterality unspecified, unspecified back pain laterality - pt education provided, continue seeing pain management for any increasing symptoms. - Amb Referral to Palliative Care  3. Orthostatic hypotension - pt posturally stable in office; pt education provided for ADLs, continue current routines and monitor at home.  4. Recurrent major depressive disorder, in partial remission - pt affect and mood positive in office; continue current therapy.  5. Anxiety - stable on lexapro, no longer taking alprazolam , removed from med list.  6. Hyperglycemia - Hemoglobin A1c - CBC with Differential/Platelet - Comprehensive metabolic panel with GFR  7. Mixed hyperlipidemia - pt education provided on lifestyle and dietary changes related to cardiovascular disease; pt declined further testing and tracking at this time.  8. History of endometrial cancer -  history of endometrial cancer dx noted; pt had no complains in office; pt monitored by oncology, last seen by K.R. Viktoria, MD (05/13/2024).   Return in about 4 months (around 12/31/2024) for routine follow up.  Dale Bound, Student-FNP -I personally  was present during the history, physical exam and medical decision-making activities of this service and have verified that the service and findings are accurately documented in the students note  Harlene K. Caro BODILY  [facility] [phone] 8 am - 5 pm) [phone] (after hours)     [1]  Allergies Allergen Reactions   Gabapentin Other (See Comments)    Ringing in ears   Hydrocodone -Acetaminophen      Dizzy, ringing in ears  Ineffective for pain  Dizzy, ringing in ears Ineffective for pain   Lyrica  [Pregabalin ]     Dizziness and ringing in ears    Codeine Other (See Comments)    Nausea  Other Reaction(s): Unknown   "

## 2024-09-03 LAB — COMPREHENSIVE METABOLIC PANEL WITH GFR
AG Ratio: 2.6 (calc) — ABNORMAL HIGH (ref 1.0–2.5)
ALT: 4 U/L — ABNORMAL LOW (ref 6–29)
AST: 14 U/L (ref 10–35)
Albumin: 4.5 g/dL (ref 3.6–5.1)
Alkaline phosphatase (APISO): 64 U/L (ref 37–153)
BUN: 16 mg/dL (ref 7–25)
CO2: 31 mmol/L (ref 20–32)
Calcium: 9.4 mg/dL (ref 8.6–10.4)
Chloride: 107 mmol/L (ref 98–110)
Creat: 0.78 mg/dL (ref 0.60–1.00)
Globulin: 1.7 g/dL — ABNORMAL LOW (ref 1.9–3.7)
Glucose, Bld: 101 mg/dL (ref 65–139)
Potassium: 3.9 mmol/L (ref 3.5–5.3)
Sodium: 146 mmol/L (ref 135–146)
Total Bilirubin: 0.5 mg/dL (ref 0.2–1.2)
Total Protein: 6.2 g/dL (ref 6.1–8.1)
eGFR: 78 mL/min/{1.73_m2}

## 2024-09-03 LAB — CBC WITH DIFFERENTIAL/PLATELET
Absolute Lymphocytes: 1190 {cells}/uL (ref 850–3900)
Absolute Monocytes: 538 {cells}/uL (ref 200–950)
Basophils Absolute: 32 {cells}/uL (ref 0–200)
Basophils Relative: 0.5 %
Eosinophils Absolute: 32 {cells}/uL (ref 15–500)
Eosinophils Relative: 0.5 %
HCT: 39.3 % (ref 35.9–46.0)
Hemoglobin: 13.1 g/dL (ref 11.7–15.5)
MCH: 30.8 pg (ref 27.0–33.0)
MCHC: 33.3 g/dL (ref 31.6–35.4)
MCV: 92.3 fL (ref 81.4–101.7)
MPV: 10.3 fL (ref 7.5–12.5)
Monocytes Relative: 8.4 %
Neutro Abs: 4608 {cells}/uL (ref 1500–7800)
Neutrophils Relative %: 72 %
Platelets: 256 10*3/uL (ref 140–400)
RBC: 4.26 Million/uL (ref 3.80–5.10)
RDW: 13.3 % (ref 11.0–15.0)
Total Lymphocyte: 18.6 %
WBC: 6.4 10*3/uL (ref 3.8–10.8)

## 2024-09-03 LAB — HEMOGLOBIN A1C
Hgb A1c MFr Bld: 5.3 %
Mean Plasma Glucose: 105 mg/dL
eAG (mmol/L): 5.8 mmol/L

## 2024-09-04 ENCOUNTER — Ambulatory Visit: Payer: Self-pay | Admitting: Nurse Practitioner

## 2024-09-06 ENCOUNTER — Telehealth

## 2024-09-06 NOTE — Telephone Encounter (Signed)
 09/06/24 Patient stated that she does not have depression or Sciatica and need to speak to PCP [name]) ASAP please.   Copied from CRM #[id number]. Topic: General - Other >> Sep 06, 2024  2:11 PM Ashley Neal wrote: Reason for CRM: Patient is calling in stating that she received a email stating that she was marked as no show for her appointment on 01/23, which also shows as a no show in the appointment desk. Patient was at her appointment on 01/23 and appointment notes are showing for this visit. Patients states she is also concerned because her appointment notes are suggesting she has severe depression, and needs palliative care, patient stated both of thee are false as well and would like a nurse to follow up with her. >> Sep 06, 2024  4:00 PM Ashley Neal wrote: Spoke to patient  >> Sep 06, 2024  3:13 PM Ashley Neal wrote: Patient is returning a call to Ashley, transferred to [facility] >> Sep 06, 2024  3:02 PM Ashley Neal wrote: Called patient and left a detailed message

## 2024-09-07 ENCOUNTER — Other Ambulatory Visit: Payer: Self-pay | Admitting: Nurse Practitioner

## 2024-09-07 DIAGNOSIS — G20A1 Parkinson's disease without dyskinesia, without mention of fluctuations: Secondary | ICD-10-CM

## 2024-09-07 NOTE — Progress Notes (Signed)
 Per [facility]- Brittany spoke to paitient:  She would like Hospice evaluation as Palliative does not have the support she is needing. I informed that she may not qualify but can have nurse do an evaluation to confirm. Just need order for hospice to evaluate her then we will determine if she is appropriate or not and let you all know.   Please place Hospice referral per patient request.  Thanks.

## 2024-09-09 ENCOUNTER — Other Ambulatory Visit: Payer: Self-pay | Admitting: Licensed Clinical Social Worker

## 2024-09-12 NOTE — Telephone Encounter (Signed)
 Noted thank you

## 2024-09-13 NOTE — Telephone Encounter (Signed)
 Please advise.... Copied from CRM [id number]. Topic: Referral - Question >> Sep 13, 2024 11:33 AM Ashley Neal wrote: Reason for CRM: Patient is calling in stating that they are not letting her cancel her hospice and the patient does not want to have hospice. Patient states many people have been showing up at her house and calling her regarding the hospice and the patient does not want any of this and no one will help her get rid of this. Patient is requesting the office to call her back asap to get rid of the hospice.

## 2024-09-15 ENCOUNTER — Telehealth

## 2024-09-15 NOTE — Patient Outreach (Signed)
 Complex Care Management   Visit Note  09/09/2024  Name:  Ashley Neal MRN: [id number] DOB: 01-15-1946  Situation: Referral received for Complex Care Management related to Stress I obtained verbal consent from Patient.  Visit completed with Patient  on the phone  Background:   Past Medical History:  Diagnosis Date   Anxiety    Basal cell carcinoma    Depression    Hypertension    Neuromuscular disorder (HCC)    Osteoarthritis    Osteopenia    Parkinson disease (HCC)    Pneumonia    Tremors of nervous system    legs   uterine ca 10/08/2021   also endometrial cancer    Assessment: Patient Reported Symptoms:  Cognitive Cognitive Status: No symptoms reported, Alert and oriented to person, place, and time, Normal speech and language skills Cognitive/Intellectual Conditions Management [RPT]: None reported or documented in medical history or problem list   Health Maintenance Behaviors: Annual physical exam  Neurological Neurological Review of Symptoms: Dizziness Neurological Management Strategies: Coping strategies, Medication therapy  HEENT HEENT Symptoms Reported: Not assessed      Cardiovascular Cardiovascular Symptoms Reported: Dizziness Cardiovascular Management Strategies: Coping strategies, Medication therapy, Routine screening  Respiratory Respiratory Symptoms Reported: No symptoms reported    Endocrine Endocrine Symptoms Reported: No symptoms reported    Gastrointestinal Gastrointestinal Symptoms Reported: Not assessed      Genitourinary Genitourinary Symptoms Reported: Other Genitourinary Management Strategies: Coping strategies Genitourinary Comment: Patient is interested in prescription for PureWick. Will discuss with PCP  Integumentary Integumentary Symptoms Reported: Not assessed    Musculoskeletal Musculoskelatal Symptoms Reviewed: Back pain, Difficulty walking, Limited mobility, Unsteady gait, Muscle pain Musculoskeletal Management Strategies: Coping  strategies, Medication therapy, Routine screening Musculoskeletal Comment: Patient has grab bars throughout home. She is interested in obtaining a motor w/c   Patient at Risk for Falls Due to: Impaired balance/gait, Impaired mobility  Psychosocial Other Psychosocial Conditions: Stress Behavioral Management Strategies: Adequate rest, Coping strategies, Support system, Medication therapy Major Change/Loss/Stressor/Fears (CP): Medical condition, self Techniques to Cope with Loss/Stress/Change: Diversional activities, Medication Quality of Family Relationships: involved    09/15/2024    PHQ2-9 Depression Screening   Little interest or pleasure in doing things    Feeling down, depressed, or hopeless    PHQ-2 - Total Score    Trouble falling or staying asleep, or sleeping too much    Feeling tired or having little energy    Poor appetite or overeating     Feeling bad about yourself - or that you are a failure or have let yourself or your family down    Trouble concentrating on things, such as reading the newspaper or watching television    Moving or speaking so slowly that other people could have noticed.  Or the opposite - being so fidgety or restless that you have been moving around a lot more than usual    Thoughts that you would be better off dead, or hurting yourself in some way    PHQ2-9 Total Score    If you checked off any problems, how difficult have these problems made it for you to do your work, take care of things at home, or get along with other people    Depression Interventions/Treatment      There were no vitals filed for this visit.    Medications Reviewed Today     Reviewed by Khalid Lacko D, LCSW (Social Worker) on 09/15/24 at 0801  Med List Status: <None>   Medication  Order Taking? Sig Documenting Provider Last Dose Status Informant  carbidopa -levodopa  (SINEMET  IR) 25-100 MG tablet [id number] Yes Take 1 tablet by mouth 5 (five) times daily.  Patient taking  differently: Take by mouth 5 (five) times daily. 1 and 1/2 tablet 5 times a day   [provider]  Active   escitalopram (LEXAPRO) 20 MG tablet [id number] Yes Take 20 mg by mouth daily. [provider]  Active   Multiple Vitamin (MULTI-VITAMIN) tablet [id number] Yes Take 1 tablet by mouth daily. [provider]  Active             Recommendation:   Continue Current Plan of Care  Follow Up Plan:   Telephone follow-up in 1 month  Rolin Kerns, LCSW [facility]  [facility], [facility] Clinical Social Worker Direct Dial: [phone]  Fax: [phone] Website: [email or web address] 8:11 AM

## 2024-09-15 NOTE — Patient Instructions (Signed)
 Visit Information  Thank you for taking time to visit with me today. Please don't hesitate to contact me if I can be of assistance to you before our next scheduled appointment.  Your next care management appointment is by telephone on 2/27 at 2 PM  Please call the care guide team at [phone] if you need to cancel, schedule, or reschedule an appointment.   Please call the Suicide and Crisis Lifeline: 988 go to [facility] [address] [place] [phone]) call 911 if you are experiencing a Mental Health or Behavioral Health Crisis or need someone to talk to.  Rolin Kerns, LCSW [facility]  [facility], [facility] Clinical Social Worker Direct Dial: [phone]  Fax: [phone] Website: [email or web address] 8:12 AM

## 2024-09-22 ENCOUNTER — Telehealth

## 2024-10-07 ENCOUNTER — Telehealth: Admitting: Licensed Clinical Social Worker

## 2024-11-10 ENCOUNTER — Ambulatory Visit: Admitting: Neurology

## 2024-11-15 ENCOUNTER — Ambulatory Visit: Admitting: Gynecologic Oncology

## 2024-11-17 ENCOUNTER — Ambulatory Visit: Attending: Nurse Practitioner | Admitting: Occupational Therapy

## 2024-11-17 ENCOUNTER — Ambulatory Visit: Admitting: Physical Therapy

## 2024-12-21 ENCOUNTER — Encounter: Admitting: Physical Medicine & Rehabilitation

## 2025-01-13 ENCOUNTER — Ambulatory Visit: Admitting: Nurse Practitioner

## 2025-05-16 ENCOUNTER — Ambulatory Visit: Payer: Self-pay | Admitting: Nurse Practitioner
